# Patient Record
Sex: Male | Born: 1935 | Race: White | Hispanic: No | Marital: Married | Smoking: Former smoker
Health system: Southern US, Community
[De-identification: ages and names within clinical notes are randomized; demographics above are authoritative.]

## PROBLEM LIST (undated history)

## (undated) DIAGNOSIS — C4491 Basal cell carcinoma of skin, unspecified: Secondary | ICD-10-CM

## (undated) DIAGNOSIS — E785 Hyperlipidemia, unspecified: Secondary | ICD-10-CM

## (undated) DIAGNOSIS — D649 Anemia, unspecified: Secondary | ICD-10-CM

## (undated) DIAGNOSIS — M1990 Unspecified osteoarthritis, unspecified site: Secondary | ICD-10-CM

## (undated) DIAGNOSIS — G2581 Restless legs syndrome: Secondary | ICD-10-CM

## (undated) DIAGNOSIS — J449 Chronic obstructive pulmonary disease, unspecified: Secondary | ICD-10-CM

## (undated) DIAGNOSIS — C439 Malignant melanoma of skin, unspecified: Secondary | ICD-10-CM

## (undated) DIAGNOSIS — N200 Calculus of kidney: Secondary | ICD-10-CM

## (undated) DIAGNOSIS — J45909 Unspecified asthma, uncomplicated: Secondary | ICD-10-CM

## (undated) DIAGNOSIS — K635 Polyp of colon: Secondary | ICD-10-CM

## (undated) DIAGNOSIS — I499 Cardiac arrhythmia, unspecified: Secondary | ICD-10-CM

## (undated) DIAGNOSIS — R51 Headache: Secondary | ICD-10-CM

## (undated) HISTORY — DX: Calculus of kidney: N20.0

## (undated) HISTORY — PX: INGUINAL HERNIA REPAIR: SUR1180

## (undated) HISTORY — PX: NOSE SURGERY: SHX723

## (undated) HISTORY — DX: Polyp of colon: K63.5

## (undated) HISTORY — PX: COLONOSCOPY W/ POLYPECTOMY: SHX1380

## (undated) HISTORY — DX: Hyperlipidemia, unspecified: E78.5

## (undated) HISTORY — DX: Basal cell carcinoma of skin, unspecified: C44.91

## (undated) HISTORY — DX: Anemia, unspecified: D64.9

## (undated) HISTORY — DX: Unspecified asthma, uncomplicated: J45.909

## (undated) HISTORY — DX: Chronic obstructive pulmonary disease, unspecified: J44.9

## (undated) HISTORY — DX: Malignant melanoma of skin, unspecified: C43.9

## (undated) HISTORY — PX: FINGER SURGERY: SHX640

---

## 1999-02-10 ENCOUNTER — Encounter: Payer: Self-pay | Admitting: Emergency Medicine

## 1999-02-10 ENCOUNTER — Inpatient Hospital Stay (HOSPITAL_COMMUNITY): Admission: EM | Admit: 1999-02-10 | Discharge: 1999-02-12 | Payer: Self-pay | Admitting: Emergency Medicine

## 1999-02-11 ENCOUNTER — Encounter: Payer: Self-pay | Admitting: General Surgery

## 1999-02-23 ENCOUNTER — Encounter: Payer: Self-pay | Admitting: General Surgery

## 1999-02-23 ENCOUNTER — Ambulatory Visit (HOSPITAL_COMMUNITY): Admission: RE | Admit: 1999-02-23 | Discharge: 1999-02-23 | Payer: Self-pay | Admitting: General Surgery

## 2002-09-08 ENCOUNTER — Encounter: Payer: Self-pay | Admitting: Gastroenterology

## 2002-09-08 ENCOUNTER — Ambulatory Visit (HOSPITAL_COMMUNITY): Admission: RE | Admit: 2002-09-08 | Discharge: 2002-09-08 | Payer: Self-pay | Admitting: Gastroenterology

## 2003-12-28 ENCOUNTER — Ambulatory Visit: Payer: Self-pay | Admitting: Internal Medicine

## 2004-09-08 ENCOUNTER — Ambulatory Visit: Payer: Self-pay | Admitting: Internal Medicine

## 2004-09-11 ENCOUNTER — Ambulatory Visit: Payer: Self-pay | Admitting: Internal Medicine

## 2004-09-14 ENCOUNTER — Ambulatory Visit: Payer: Self-pay | Admitting: Internal Medicine

## 2004-10-30 ENCOUNTER — Ambulatory Visit: Payer: Self-pay | Admitting: Gastroenterology

## 2004-10-31 ENCOUNTER — Ambulatory Visit: Payer: Self-pay | Admitting: Gastroenterology

## 2004-10-31 ENCOUNTER — Encounter (INDEPENDENT_AMBULATORY_CARE_PROVIDER_SITE_OTHER): Payer: Self-pay | Admitting: Specialist

## 2004-11-15 ENCOUNTER — Ambulatory Visit: Payer: Self-pay | Admitting: Gastroenterology

## 2004-11-29 ENCOUNTER — Ambulatory Visit: Payer: Self-pay | Admitting: Gastroenterology

## 2004-12-08 ENCOUNTER — Ambulatory Visit: Payer: Self-pay | Admitting: Internal Medicine

## 2005-02-02 ENCOUNTER — Emergency Department (HOSPITAL_COMMUNITY): Admission: EM | Admit: 2005-02-02 | Discharge: 2005-02-02 | Payer: Self-pay | Admitting: Emergency Medicine

## 2005-02-05 ENCOUNTER — Ambulatory Visit: Payer: Self-pay | Admitting: Internal Medicine

## 2005-02-16 ENCOUNTER — Ambulatory Visit: Payer: Self-pay

## 2005-05-07 ENCOUNTER — Ambulatory Visit: Payer: Self-pay | Admitting: Internal Medicine

## 2005-07-05 ENCOUNTER — Ambulatory Visit: Payer: Self-pay | Admitting: Internal Medicine

## 2005-09-19 ENCOUNTER — Ambulatory Visit: Payer: Self-pay | Admitting: Internal Medicine

## 2005-10-05 ENCOUNTER — Ambulatory Visit: Payer: Self-pay | Admitting: Internal Medicine

## 2005-10-29 ENCOUNTER — Ambulatory Visit: Payer: Self-pay | Admitting: Internal Medicine

## 2005-12-05 ENCOUNTER — Ambulatory Visit: Payer: Self-pay | Admitting: Internal Medicine

## 2006-02-21 ENCOUNTER — Ambulatory Visit: Payer: Self-pay | Admitting: Internal Medicine

## 2006-02-25 ENCOUNTER — Ambulatory Visit: Payer: Self-pay | Admitting: Critical Care Medicine

## 2006-02-25 ENCOUNTER — Ambulatory Visit: Payer: Self-pay | Admitting: Internal Medicine

## 2006-02-25 ENCOUNTER — Ambulatory Visit: Payer: Self-pay | Admitting: Cardiovascular Disease

## 2006-02-25 ENCOUNTER — Inpatient Hospital Stay (HOSPITAL_COMMUNITY): Admission: EM | Admit: 2006-02-25 | Discharge: 2006-03-04 | Payer: Self-pay | Admitting: Emergency Medicine

## 2006-02-26 ENCOUNTER — Encounter: Payer: Self-pay | Admitting: Cardiology

## 2006-02-26 ENCOUNTER — Ambulatory Visit: Payer: Self-pay | Admitting: Internal Medicine

## 2006-03-14 ENCOUNTER — Ambulatory Visit: Payer: Self-pay | Admitting: Critical Care Medicine

## 2006-03-15 DIAGNOSIS — J45909 Unspecified asthma, uncomplicated: Secondary | ICD-10-CM | POA: Insufficient documentation

## 2006-03-15 DIAGNOSIS — Z860100 Personal history of colon polyps, unspecified: Secondary | ICD-10-CM | POA: Insufficient documentation

## 2006-03-15 DIAGNOSIS — J4489 Other specified chronic obstructive pulmonary disease: Secondary | ICD-10-CM | POA: Insufficient documentation

## 2006-03-15 DIAGNOSIS — M810 Age-related osteoporosis without current pathological fracture: Secondary | ICD-10-CM | POA: Insufficient documentation

## 2006-03-15 DIAGNOSIS — D649 Anemia, unspecified: Secondary | ICD-10-CM | POA: Insufficient documentation

## 2006-03-15 DIAGNOSIS — M109 Gout, unspecified: Secondary | ICD-10-CM | POA: Insufficient documentation

## 2006-03-15 DIAGNOSIS — J449 Chronic obstructive pulmonary disease, unspecified: Secondary | ICD-10-CM | POA: Insufficient documentation

## 2006-05-02 ENCOUNTER — Ambulatory Visit: Payer: Self-pay | Admitting: Internal Medicine

## 2006-05-02 LAB — CONVERTED CEMR LAB
Cholesterol: 190 mg/dL (ref 0–200)
HDL: 41.8 mg/dL (ref >39.0)
Hgb A1c MFr Bld: 5.7 % (ref 4.6–6.0)
LDL Cholesterol: 135 mg/dL — ABNORMAL HIGH (ref 0–99)
Total CHOL/HDL Ratio: 4.5
Triglycerides: 64 mg/dL (ref 0–149)
VLDL: 13 mg/dL (ref 0–40)

## 2006-05-20 ENCOUNTER — Ambulatory Visit: Payer: Self-pay | Admitting: Internal Medicine

## 2006-07-24 ENCOUNTER — Ambulatory Visit: Payer: Self-pay | Admitting: Internal Medicine

## 2006-08-09 ENCOUNTER — Ambulatory Visit: Payer: Self-pay | Admitting: Internal Medicine

## 2006-08-12 ENCOUNTER — Encounter: Payer: Self-pay | Admitting: Internal Medicine

## 2006-08-12 LAB — CONVERTED CEMR LAB
ALT: 20 units/L (ref 0–40)
AST: 31 units/L (ref 0–37)
Cholesterol: 137 mg/dL (ref 0–200)
HDL: 50.9 mg/dL (ref >39.0)
Hgb A1c MFr Bld: 6 % (ref 4.6–6.0)
LDL Cholesterol: 78 mg/dL (ref 0–99)
Total CHOL/HDL Ratio: 2.7
Triglycerides: 40 mg/dL (ref 0–149)
VLDL: 8 mg/dL (ref 0–40)

## 2006-08-13 ENCOUNTER — Encounter (INDEPENDENT_AMBULATORY_CARE_PROVIDER_SITE_OTHER): Payer: Self-pay | Admitting: *Deleted

## 2006-12-03 ENCOUNTER — Ambulatory Visit: Payer: Self-pay | Admitting: Internal Medicine

## 2006-12-13 ENCOUNTER — Telehealth (INDEPENDENT_AMBULATORY_CARE_PROVIDER_SITE_OTHER): Payer: Self-pay | Admitting: *Deleted

## 2007-01-01 ENCOUNTER — Telehealth (INDEPENDENT_AMBULATORY_CARE_PROVIDER_SITE_OTHER): Payer: Self-pay | Admitting: *Deleted

## 2007-01-02 ENCOUNTER — Ambulatory Visit: Payer: Self-pay | Admitting: Internal Medicine

## 2007-01-13 ENCOUNTER — Telehealth (INDEPENDENT_AMBULATORY_CARE_PROVIDER_SITE_OTHER): Payer: Self-pay | Admitting: *Deleted

## 2007-03-19 ENCOUNTER — Ambulatory Visit: Payer: Self-pay | Admitting: Internal Medicine

## 2007-03-22 LAB — CONVERTED CEMR LAB: Hgb A1c MFr Bld: 6 % (ref 4.6–6.0)

## 2007-03-24 ENCOUNTER — Encounter (INDEPENDENT_AMBULATORY_CARE_PROVIDER_SITE_OTHER): Payer: Self-pay | Admitting: *Deleted

## 2007-05-12 ENCOUNTER — Telehealth (INDEPENDENT_AMBULATORY_CARE_PROVIDER_SITE_OTHER): Payer: Self-pay | Admitting: *Deleted

## 2007-05-13 ENCOUNTER — Telehealth (INDEPENDENT_AMBULATORY_CARE_PROVIDER_SITE_OTHER): Payer: Self-pay | Admitting: *Deleted

## 2007-05-14 ENCOUNTER — Telehealth (INDEPENDENT_AMBULATORY_CARE_PROVIDER_SITE_OTHER): Payer: Self-pay | Admitting: *Deleted

## 2007-06-10 ENCOUNTER — Telehealth (INDEPENDENT_AMBULATORY_CARE_PROVIDER_SITE_OTHER): Payer: Self-pay | Admitting: *Deleted

## 2007-06-16 ENCOUNTER — Telehealth (INDEPENDENT_AMBULATORY_CARE_PROVIDER_SITE_OTHER): Payer: Self-pay | Admitting: *Deleted

## 2007-06-18 ENCOUNTER — Telehealth (INDEPENDENT_AMBULATORY_CARE_PROVIDER_SITE_OTHER): Payer: Self-pay | Admitting: *Deleted

## 2007-08-18 ENCOUNTER — Telehealth (INDEPENDENT_AMBULATORY_CARE_PROVIDER_SITE_OTHER): Payer: Self-pay | Admitting: *Deleted

## 2007-08-20 ENCOUNTER — Ambulatory Visit: Payer: Self-pay | Admitting: Internal Medicine

## 2007-08-25 ENCOUNTER — Encounter (INDEPENDENT_AMBULATORY_CARE_PROVIDER_SITE_OTHER): Payer: Self-pay | Admitting: *Deleted

## 2007-08-25 LAB — CONVERTED CEMR LAB
ALT: 23 units/L (ref 0–53)
AST: 26 units/L (ref 0–37)
Albumin: 3.9 g/dL (ref 3.5–5.2)
Alkaline Phosphatase: 54 units/L (ref 39–117)
Bilirubin, Direct: 0.1 mg/dL (ref 0.0–0.3)
Cholesterol: 121 mg/dL (ref 0–200)
HDL: 45.2 mg/dL (ref >39.0)
LDL Cholesterol: 63 mg/dL (ref 0–99)
Total Bilirubin: 0.8 mg/dL (ref 0.3–1.2)
Total CHOL/HDL Ratio: 2.7
Total Protein: 7.3 g/dL (ref 6.0–8.3)
Triglycerides: 63 mg/dL (ref 0–149)
VLDL: 13 mg/dL (ref 0–40)
Vit D, 1,25-Dihydroxy: 49 (ref 30–89)

## 2007-08-28 ENCOUNTER — Telehealth (INDEPENDENT_AMBULATORY_CARE_PROVIDER_SITE_OTHER): Payer: Self-pay | Admitting: *Deleted

## 2007-09-11 ENCOUNTER — Ambulatory Visit: Payer: Self-pay | Admitting: Internal Medicine

## 2007-09-11 DIAGNOSIS — J309 Allergic rhinitis, unspecified: Secondary | ICD-10-CM | POA: Insufficient documentation

## 2007-09-11 DIAGNOSIS — G2581 Restless legs syndrome: Secondary | ICD-10-CM | POA: Insufficient documentation

## 2007-09-11 LAB — CONVERTED CEMR LAB
Cholesterol, target level: 200 mg/dL
HDL goal, serum: 40 mg/dL
LDL Goal: 160 mg/dL

## 2007-09-19 ENCOUNTER — Telehealth (INDEPENDENT_AMBULATORY_CARE_PROVIDER_SITE_OTHER): Payer: Self-pay | Admitting: *Deleted

## 2007-09-19 LAB — CONVERTED CEMR LAB
BUN: 21 mg/dL (ref 6–23)
Basophils Absolute: 0.1 10*3/uL (ref 0.0–0.1)
Basophils Relative: 0.8 % (ref 0.0–3.0)
Creatinine, Ser: 1.1 mg/dL (ref 0.4–1.5)
Eosinophils Absolute: 0.2 10*3/uL (ref 0.0–0.7)
Eosinophils Relative: 2.9 % (ref 0.0–5.0)
HCT: 34.2 % — ABNORMAL LOW (ref 39.0–52.0)
Hemoglobin: 12.1 g/dL — ABNORMAL LOW (ref 13.0–17.0)
Hgb A1c MFr Bld: 6 % (ref 4.6–6.0)
Iron: 95 ug/dL (ref 42–165)
Lymphocytes Relative: 26.7 % (ref 12.0–46.0)
MCHC: 35.5 g/dL (ref 30.0–36.0)
MCV: 86.1 fL (ref 78.0–100.0)
Monocytes Absolute: 1.1 10*3/uL — ABNORMAL HIGH (ref 0.1–1.0)
Monocytes Relative: 13.1 % — ABNORMAL HIGH (ref 3.0–12.0)
Neutro Abs: 4.6 10*3/uL (ref 1.4–7.7)
Neutrophils Relative %: 56.5 % (ref 43.0–77.0)
Platelets: 198 10*3/uL (ref 150–400)
Potassium: 4.3 meq/L (ref 3.5–5.1)
RBC: 3.97 M/uL — ABNORMAL LOW (ref 4.22–5.81)
RDW: 12.8 % (ref 11.5–14.6)
Saturation Ratios: 27.1 % (ref 20.0–50.0)
TSH: 2.05 microintl units/mL (ref 0.35–5.50)
Transferrin: 250.7 mg/dL (ref >=212.0)
WBC: 8.2 10*3/uL (ref 4.5–10.5)

## 2007-10-01 ENCOUNTER — Encounter (INDEPENDENT_AMBULATORY_CARE_PROVIDER_SITE_OTHER): Payer: Self-pay | Admitting: *Deleted

## 2007-10-01 ENCOUNTER — Ambulatory Visit: Payer: Self-pay | Admitting: Internal Medicine

## 2007-10-01 LAB — CONVERTED CEMR LAB
OCCULT 1: NEGATIVE
OCCULT 2: NEGATIVE
OCCULT 3: NEGATIVE

## 2007-10-05 LAB — CONVERTED CEMR LAB
Basophils Absolute: 0.1 10*3/uL (ref 0.0–0.1)
Basophils Relative: 0.9 % (ref 0.0–3.0)
Eosinophils Absolute: 0.1 10*3/uL (ref 0.0–0.7)
Eosinophils Relative: 2.2 % (ref 0.0–5.0)
Folate: >20 ng/mL
HCT: 34.9 % — ABNORMAL LOW (ref 39.0–52.0)
Hemoglobin: 12.2 g/dL — ABNORMAL LOW (ref 13.0–17.0)
Lymphocytes Relative: 24.3 % (ref 12.0–46.0)
MCHC: 34.8 g/dL (ref 30.0–36.0)
MCV: 87.3 fL (ref 78.0–100.0)
Monocytes Absolute: 0.9 10*3/uL (ref 0.1–1.0)
Monocytes Relative: 13.6 % — ABNORMAL HIGH (ref 3.0–12.0)
Neutro Abs: 4 10*3/uL (ref 1.4–7.7)
Neutrophils Relative %: 59 % (ref 43.0–77.0)
Platelets: 201 10*3/uL (ref 150–400)
RBC: 4 M/uL — ABNORMAL LOW (ref 4.22–5.81)
RDW: 12.1 % (ref 11.5–14.6)
Vitamin B-12: 361 pg/mL (ref 211–911)
WBC: 6.7 10*3/uL (ref 4.5–10.5)

## 2007-10-06 ENCOUNTER — Encounter (INDEPENDENT_AMBULATORY_CARE_PROVIDER_SITE_OTHER): Payer: Self-pay | Admitting: *Deleted

## 2007-11-12 ENCOUNTER — Ambulatory Visit: Payer: Self-pay | Admitting: Internal Medicine

## 2008-05-06 ENCOUNTER — Ambulatory Visit: Payer: Self-pay | Admitting: Internal Medicine

## 2008-05-06 DIAGNOSIS — J069 Acute upper respiratory infection, unspecified: Secondary | ICD-10-CM | POA: Insufficient documentation

## 2008-05-06 DIAGNOSIS — R7309 Other abnormal glucose: Secondary | ICD-10-CM | POA: Insufficient documentation

## 2008-05-06 DIAGNOSIS — K5289 Other specified noninfective gastroenteritis and colitis: Secondary | ICD-10-CM | POA: Insufficient documentation

## 2008-05-10 ENCOUNTER — Telehealth: Payer: Self-pay | Admitting: Internal Medicine

## 2008-05-10 ENCOUNTER — Encounter (INDEPENDENT_AMBULATORY_CARE_PROVIDER_SITE_OTHER): Payer: Self-pay | Admitting: *Deleted

## 2008-05-10 LAB — CONVERTED CEMR LAB
Basophils Absolute: 0 10*3/uL (ref 0.0–0.1)
Basophils Relative: 0.5 % (ref 0.0–3.0)
Eosinophils Absolute: 0.2 10*3/uL (ref 0.0–0.7)
Eosinophils Relative: 2.2 % (ref 0.0–5.0)
Folate: >20 ng/mL
HCT: 38 % — ABNORMAL LOW (ref 39.0–52.0)
Hemoglobin: 13 g/dL (ref 13.0–17.0)
Hgb A1c MFr Bld: 5.9 % (ref 4.6–6.5)
Iron: 102 ug/dL (ref 42–165)
Lymphocytes Relative: 28.8 % (ref 12.0–46.0)
Lymphs Abs: 2 10*3/uL (ref 0.7–4.0)
MCHC: 34.3 g/dL (ref 30.0–36.0)
MCV: 87.8 fL (ref 78.0–100.0)
Monocytes Absolute: 1 10*3/uL (ref 0.1–1.0)
Monocytes Relative: 14.5 % — ABNORMAL HIGH (ref 3.0–12.0)
Neutro Abs: 3.8 10*3/uL (ref 1.4–7.7)
Neutrophils Relative %: 54 % (ref 43.0–77.0)
Platelets: 180 10*3/uL (ref 150.0–400.0)
RBC: 4.32 M/uL (ref 4.22–5.81)
RDW: 12.6 % (ref 11.5–14.6)
Saturation Ratios: 24 % (ref 20.0–50.0)
Transferrin: 303.2 mg/dL (ref 212.0–360.0)
Vit D, 25-Hydroxy: 45 ng/mL (ref 30–89)
Vitamin B-12: 411 pg/mL (ref 211–911)
WBC: 7 10*3/uL (ref 4.5–10.5)

## 2008-05-13 ENCOUNTER — Telehealth: Payer: Self-pay | Admitting: Internal Medicine

## 2008-07-08 ENCOUNTER — Telehealth (INDEPENDENT_AMBULATORY_CARE_PROVIDER_SITE_OTHER): Payer: Self-pay | Admitting: *Deleted

## 2008-08-18 ENCOUNTER — Ambulatory Visit: Payer: Self-pay | Admitting: Internal Medicine

## 2008-08-18 ENCOUNTER — Encounter: Payer: Self-pay | Admitting: Internal Medicine

## 2008-09-08 ENCOUNTER — Emergency Department (HOSPITAL_COMMUNITY): Admission: EM | Admit: 2008-09-08 | Discharge: 2008-09-08 | Payer: Self-pay | Admitting: Emergency Medicine

## 2008-09-10 ENCOUNTER — Telehealth (INDEPENDENT_AMBULATORY_CARE_PROVIDER_SITE_OTHER): Payer: Self-pay | Admitting: *Deleted

## 2008-09-17 ENCOUNTER — Telehealth (INDEPENDENT_AMBULATORY_CARE_PROVIDER_SITE_OTHER): Payer: Self-pay | Admitting: *Deleted

## 2008-10-05 ENCOUNTER — Ambulatory Visit: Payer: Self-pay | Admitting: Internal Medicine

## 2008-10-05 DIAGNOSIS — R0681 Apnea, not elsewhere classified: Secondary | ICD-10-CM | POA: Insufficient documentation

## 2008-10-05 DIAGNOSIS — R5383 Other fatigue: Secondary | ICD-10-CM

## 2008-10-05 DIAGNOSIS — R5381 Other malaise: Secondary | ICD-10-CM | POA: Insufficient documentation

## 2008-10-05 DIAGNOSIS — Z85828 Personal history of other malignant neoplasm of skin: Secondary | ICD-10-CM | POA: Insufficient documentation

## 2008-10-10 LAB — CONVERTED CEMR LAB
ALT: 56 units/L — ABNORMAL HIGH (ref 0–53)
AST: 58 units/L — ABNORMAL HIGH (ref 0–37)
BUN: 19 mg/dL (ref 6–23)
Basophils Absolute: 0 10*3/uL (ref 0.0–0.1)
Basophils Relative: 0.5 % (ref 0.0–3.0)
Creatinine, Ser: 1.2 mg/dL (ref 0.4–1.5)
Eosinophils Absolute: 0.3 10*3/uL (ref 0.0–0.7)
Eosinophils Relative: 3.7 % (ref 0.0–5.0)
Free T4: 0.8 ng/dL (ref 0.6–1.6)
HCT: 34.9 % — ABNORMAL LOW (ref 39.0–52.0)
Hemoglobin: 12 g/dL — ABNORMAL LOW (ref 13.0–17.0)
Hgb A1c MFr Bld: 6 % (ref 4.6–6.5)
Iron: 83 ug/dL (ref 42–165)
Lymphocytes Relative: 23.6 % (ref 12.0–46.0)
Lymphs Abs: 1.7 10*3/uL (ref 0.7–4.0)
MCHC: 34.3 g/dL (ref 30.0–36.0)
MCV: 88.2 fL (ref 78.0–100.0)
Monocytes Absolute: 1 10*3/uL (ref 0.1–1.0)
Monocytes Relative: 14.2 % — ABNORMAL HIGH (ref 3.0–12.0)
Neutro Abs: 4.1 10*3/uL (ref 1.4–7.7)
Neutrophils Relative %: 58 % (ref 43.0–77.0)
Platelets: 175 10*3/uL (ref 150.0–400.0)
Potassium: 4.8 meq/L (ref 3.5–5.1)
RBC: 3.96 M/uL — ABNORMAL LOW (ref 4.22–5.81)
RDW: 12.5 % (ref 11.5–14.6)
Saturation Ratios: 23.9 % (ref 20.0–50.0)
TSH: 2.19 microintl units/mL (ref 0.35–5.50)
Transferrin: 248 mg/dL (ref 212.0–360.0)
WBC: 7.1 10*3/uL (ref 4.5–10.5)

## 2008-10-11 ENCOUNTER — Encounter (INDEPENDENT_AMBULATORY_CARE_PROVIDER_SITE_OTHER): Payer: Self-pay | Admitting: *Deleted

## 2008-10-14 ENCOUNTER — Ambulatory Visit: Payer: Self-pay | Admitting: Pulmonary Disease

## 2008-10-14 DIAGNOSIS — R0989 Other specified symptoms and signs involving the circulatory and respiratory systems: Secondary | ICD-10-CM

## 2008-10-14 DIAGNOSIS — R0609 Other forms of dyspnea: Secondary | ICD-10-CM | POA: Insufficient documentation

## 2008-10-19 ENCOUNTER — Encounter: Payer: Self-pay | Admitting: Internal Medicine

## 2008-11-10 ENCOUNTER — Telehealth (INDEPENDENT_AMBULATORY_CARE_PROVIDER_SITE_OTHER): Payer: Self-pay | Admitting: *Deleted

## 2008-11-11 ENCOUNTER — Ambulatory Visit: Payer: Self-pay | Admitting: Internal Medicine

## 2008-11-15 ENCOUNTER — Encounter (INDEPENDENT_AMBULATORY_CARE_PROVIDER_SITE_OTHER): Payer: Self-pay | Admitting: *Deleted

## 2008-11-15 LAB — CONVERTED CEMR LAB
ALT: 24 units/L (ref 0–53)
AST: 31 units/L (ref 0–37)
Albumin: 3.9 g/dL (ref 3.5–5.2)
Alkaline Phosphatase: 59 units/L (ref 39–117)
Basophils Absolute: 0.1 10*3/uL (ref 0.0–0.1)
Basophils Relative: 0.9 % (ref 0.0–3.0)
Bilirubin, Direct: 0 mg/dL (ref 0.0–0.3)
Eosinophils Absolute: 0.2 10*3/uL (ref 0.0–0.7)
Eosinophils Relative: 3.2 % (ref 0.0–5.0)
Folate: >20 ng/mL
HCT: 35.9 % — ABNORMAL LOW (ref 39.0–52.0)
Hemoglobin: 12.1 g/dL — ABNORMAL LOW (ref 13.0–17.0)
Lymphocytes Relative: 27.1 % (ref 12.0–46.0)
Lymphs Abs: 1.6 10*3/uL (ref 0.7–4.0)
MCHC: 33.7 g/dL (ref 30.0–36.0)
MCV: 89 fL (ref 78.0–100.0)
Monocytes Absolute: 0.8 10*3/uL (ref 0.1–1.0)
Monocytes Relative: 13.9 % — ABNORMAL HIGH (ref 3.0–12.0)
Neutro Abs: 3.3 10*3/uL (ref 1.4–7.7)
Neutrophils Relative %: 54.9 % (ref 43.0–77.0)
Platelets: 196 10*3/uL (ref 150.0–400.0)
RBC: 4.04 M/uL — ABNORMAL LOW (ref 4.22–5.81)
RDW: 12.6 % (ref 11.5–14.6)
Total Bilirubin: 1 mg/dL (ref 0.3–1.2)
Total Protein: 7.6 g/dL (ref 6.0–8.3)
Vitamin B-12: 347 pg/mL (ref 211–911)
WBC: 6 10*3/uL (ref 4.5–10.5)

## 2008-11-19 ENCOUNTER — Telehealth (INDEPENDENT_AMBULATORY_CARE_PROVIDER_SITE_OTHER): Payer: Self-pay | Admitting: *Deleted

## 2008-11-25 ENCOUNTER — Ambulatory Visit: Payer: Self-pay | Admitting: Internal Medicine

## 2008-11-29 ENCOUNTER — Ambulatory Visit: Payer: Self-pay | Admitting: Internal Medicine

## 2008-11-29 DIAGNOSIS — G479 Sleep disorder, unspecified: Secondary | ICD-10-CM | POA: Insufficient documentation

## 2009-02-07 ENCOUNTER — Telehealth (INDEPENDENT_AMBULATORY_CARE_PROVIDER_SITE_OTHER): Payer: Self-pay | Admitting: *Deleted

## 2009-03-22 ENCOUNTER — Telehealth (INDEPENDENT_AMBULATORY_CARE_PROVIDER_SITE_OTHER): Payer: Self-pay | Admitting: *Deleted

## 2009-05-09 ENCOUNTER — Telehealth (INDEPENDENT_AMBULATORY_CARE_PROVIDER_SITE_OTHER): Payer: Self-pay | Admitting: *Deleted

## 2009-06-13 ENCOUNTER — Telehealth (INDEPENDENT_AMBULATORY_CARE_PROVIDER_SITE_OTHER): Payer: Self-pay | Admitting: *Deleted

## 2009-07-11 ENCOUNTER — Telehealth (INDEPENDENT_AMBULATORY_CARE_PROVIDER_SITE_OTHER): Payer: Self-pay | Admitting: *Deleted

## 2009-09-12 ENCOUNTER — Telehealth (INDEPENDENT_AMBULATORY_CARE_PROVIDER_SITE_OTHER): Payer: Self-pay | Admitting: *Deleted

## 2009-10-07 ENCOUNTER — Ambulatory Visit: Payer: Self-pay | Admitting: Internal Medicine

## 2009-10-13 ENCOUNTER — Telehealth (INDEPENDENT_AMBULATORY_CARE_PROVIDER_SITE_OTHER): Payer: Self-pay | Admitting: *Deleted

## 2009-10-18 ENCOUNTER — Telehealth (INDEPENDENT_AMBULATORY_CARE_PROVIDER_SITE_OTHER): Payer: Self-pay | Admitting: *Deleted

## 2009-10-25 ENCOUNTER — Ambulatory Visit: Payer: Self-pay | Admitting: Internal Medicine

## 2009-10-25 DIAGNOSIS — Z87442 Personal history of urinary calculi: Secondary | ICD-10-CM | POA: Insufficient documentation

## 2009-10-25 DIAGNOSIS — I10 Essential (primary) hypertension: Secondary | ICD-10-CM | POA: Insufficient documentation

## 2009-10-25 DIAGNOSIS — E785 Hyperlipidemia, unspecified: Secondary | ICD-10-CM | POA: Insufficient documentation

## 2009-10-26 ENCOUNTER — Telehealth (INDEPENDENT_AMBULATORY_CARE_PROVIDER_SITE_OTHER): Payer: Self-pay | Admitting: *Deleted

## 2009-10-26 ENCOUNTER — Encounter (INDEPENDENT_AMBULATORY_CARE_PROVIDER_SITE_OTHER): Payer: Self-pay | Admitting: *Deleted

## 2009-10-26 ENCOUNTER — Encounter: Payer: Self-pay | Admitting: Internal Medicine

## 2009-10-26 LAB — CONVERTED CEMR LAB
ALT: 41 units/L (ref 0–53)
AST: 47 units/L — ABNORMAL HIGH (ref 0–37)
Albumin: 4.1 g/dL (ref 3.5–5.2)
Alkaline Phosphatase: 51 units/L (ref 39–117)
BUN: 21 mg/dL (ref 6–23)
Basophils Absolute: 0 10*3/uL (ref 0.0–0.1)
Basophils Relative: 0.5 % (ref 0.0–3.0)
Bilirubin, Direct: 0.1 mg/dL (ref 0.0–0.3)
CO2: 29 meq/L (ref 19–32)
Calcium: 9.2 mg/dL (ref 8.4–10.5)
Chloride: 106 meq/L (ref 96–112)
Cholesterol: 138 mg/dL (ref 0–200)
Creatinine, Ser: 1.1 mg/dL (ref 0.4–1.5)
Eosinophils Absolute: 0.1 10*3/uL (ref 0.0–0.7)
Eosinophils Relative: 1.8 % (ref 0.0–5.0)
GFR calc non Af Amer: 70.39 mL/min (ref >60)
Glucose, Bld: 87 mg/dL (ref 70–99)
HCT: 36.2 % — ABNORMAL LOW (ref 39.0–52.0)
HDL: 52 mg/dL (ref >39.00)
Hemoglobin: 12.7 g/dL — ABNORMAL LOW (ref 13.0–17.0)
LDL Cholesterol: 75 mg/dL (ref 0–99)
Lymphocytes Relative: 26.4 % (ref 12.0–46.0)
Lymphs Abs: 1.7 10*3/uL (ref 0.7–4.0)
MCHC: 35 g/dL (ref 30.0–36.0)
MCV: 87.8 fL (ref 78.0–100.0)
Monocytes Absolute: 0.9 10*3/uL (ref 0.1–1.0)
Monocytes Relative: 13.6 % — ABNORMAL HIGH (ref 3.0–12.0)
Neutro Abs: 3.6 10*3/uL (ref 1.4–7.7)
Neutrophils Relative %: 57.7 % (ref 43.0–77.0)
Platelets: 188 10*3/uL (ref 150.0–400.0)
Potassium: 4.5 meq/L (ref 3.5–5.1)
RBC: 4.12 M/uL — ABNORMAL LOW (ref 4.22–5.81)
RDW: 13.6 % (ref 11.5–14.6)
Sodium: 143 meq/L (ref 135–145)
TSH: 2.21 microintl units/mL (ref 0.35–5.50)
Total Bilirubin: 0.7 mg/dL (ref 0.3–1.2)
Total CHOL/HDL Ratio: 3
Total Protein: 7.1 g/dL (ref 6.0–8.3)
Triglycerides: 53 mg/dL (ref 0.0–149.0)
VLDL: 10.6 mg/dL (ref 0.0–40.0)
Vit D, 25-Hydroxy: 50 ng/mL (ref 30–89)
WBC: 6.3 10*3/uL (ref 4.5–10.5)

## 2009-10-28 ENCOUNTER — Encounter: Payer: Self-pay | Admitting: Internal Medicine

## 2009-10-31 ENCOUNTER — Encounter: Payer: Self-pay | Admitting: Internal Medicine

## 2009-11-04 ENCOUNTER — Ambulatory Visit: Payer: Self-pay | Admitting: Cardiology

## 2009-11-07 ENCOUNTER — Telehealth (INDEPENDENT_AMBULATORY_CARE_PROVIDER_SITE_OTHER): Payer: Self-pay | Admitting: *Deleted

## 2009-11-14 ENCOUNTER — Telehealth (INDEPENDENT_AMBULATORY_CARE_PROVIDER_SITE_OTHER): Payer: Self-pay | Admitting: *Deleted

## 2009-11-23 ENCOUNTER — Encounter: Payer: Self-pay | Admitting: Internal Medicine

## 2009-12-06 ENCOUNTER — Encounter: Payer: Self-pay | Admitting: Internal Medicine

## 2009-12-09 ENCOUNTER — Ambulatory Visit: Payer: Self-pay | Admitting: Internal Medicine

## 2009-12-12 ENCOUNTER — Encounter: Admission: RE | Admit: 2009-12-12 | Discharge: 2009-12-12 | Payer: Self-pay | Admitting: Specialist

## 2009-12-13 ENCOUNTER — Encounter: Payer: Self-pay | Admitting: Internal Medicine

## 2010-02-09 ENCOUNTER — Telehealth (INDEPENDENT_AMBULATORY_CARE_PROVIDER_SITE_OTHER): Payer: Self-pay | Admitting: *Deleted

## 2010-02-19 DIAGNOSIS — C439 Malignant melanoma of skin, unspecified: Secondary | ICD-10-CM

## 2010-02-19 HISTORY — DX: Malignant melanoma of skin, unspecified: C43.9

## 2010-03-21 NOTE — Letter (Signed)
Summary: [facility]  [facility]   Imported By: Lanelle Bal 10/28/2008 09:34:15  _____________________________________________________________________  External Attachment:    Type:   Image     Comment:   External Document

## 2010-03-21 NOTE — Assessment & Plan Note (Signed)
Summary: sinus--acute only--tl   Vital Signs:  Patient Profile:   77 Years Old Male Weight:      154 pounds Temp:     98.6 degrees F oral Pulse rate:   72 / minute BP sitting:   140 / 78  (left arm) Cuff size:   large  Vitals Entered By: Shonna Chock (September 11, 2007 1:39 PM)                 Chief Complaint:  SINUS PRESSURE/HEADACHE OFF/ON Robert Richard.  History of Present Illness: Labs, Lipid & HTN  Management  reviewed . Values @ goal  on  1/2 of 40 mg of pravastatin. Sinus symptoms intermittently; no better with spray.  Hypertension History:      He complains of headache and visual symptoms, but denies chest pain, palpitations, dyspnea with exertion, orthopnea, PND, peripheral edema, neurologic problems, syncope, and side effects from treatment.  He notes no problems with any antihypertensive medication side effects.  Further comments include: BP 135-145/ 80.        Positive major cardiovascular risk factors include male age 57 years old or older, hyperlipidemia, and hypertension.  Negative major cardiovascular risk factors include no history of diabetes, negative family history for ischemic heart disease, and non-tobacco-user status.        Further assessment for target organ damage reveals no history of ASHD, stroke/TIA, or peripheral vascular disease.    Lipid Management History:      Positive NCEP/ATP III risk factors include male age 66 years old or older and hypertension.  Negative NCEP/ATP III risk factors include non-diabetic, no family history for ischemic heart disease, non-tobacco-user status, no ASHD (atherosclerotic heart disease), no prior stroke/TIA, no peripheral vascular disease, and no history of aortic aneurysm.        Current Allergies (reviewed today): ! * IVP DYE    Risk Factors:  Tobacco use:  quit  Family History Risk Factors:    Family History of MI in females < 41 years old:  no    Family History of MI in males < 36 years old:  no    Review of Systems  General      Denies chills, fever, sleep disorder, and sweats.  Eyes      Complains of double vision.      Denies blurring and vision loss-both eyes.      Glasses correct this  ENT      R frontal ha w/o purulence; no facial pain  CV      Denies bluish discoloration of lips or nails and leg cramps with exertion.  Resp      Denies excessive snoring, hypersomnolence, and morning headaches.      Some am sputum with ambulation in am  MS      Complains of muscle aches and cramps.      ? RLS; Aleve or Legitin help  Neuro      Denies disturbances in coordination, numbness, poor balance, and tingling.  Allergy      Complains of itching eyes, seasonal allergies, and sneezing.      Claritin doesn't help sinuses   Physical Exam  General:     in no acute distress; alert,appropriate and cooperative throughout examination Head:     Normocephalic and atraumatic without obvious abnormalities. No apparent alopecia or balding. Eyes:     No corneal or conjunctival inflammation noted. EOMI. Perrla. Vision grossly normal. Ears:     External ear exam shows no significant  lesions or deformities.  Otoscopic examination reveals clear canals, tympanic membranes are intact bilaterally without bulging, retraction, inflammation or discharge. Hearing is grossly normal bilaterally. Nose:     External nasal examination shows no deformity or inflammation. Nasal mucosa are pink and moist without lesions or exudates.Septum to R Mouth:     Oral mucosa and oropharynx without lesions or exudates.  Teeth in good repair. Breasts:     Barrel chest Lungs:     Normal respiratory effort, chest expands symmetrically. Lungs are clear to auscultation, no crackles or wheezes. Heart:     normal rate, regular rhythm, no gallop, no rub, no JVD, and grade 1 /6 systolic murmur.   Pulses:     R and L carotid,radial, and posterior tibial pulses are full and equal bilaterally. Decreased DPP Skin:      Intact without suspicious lesions or rashes Cervical Nodes:     No lymphadenopathy noted Axillary Nodes:     No palpable lymphadenopathy Psych:     Oriented X3 and severely anxious.      Impression & Recommendations:  Problem # 1:  RHINITIS (ICD-477.9)  His updated medication list for this problem includes:    Loratadine 10 Mg Tabs (Loratadine) .Marland Kitchen... 1 once daily prn    Astepro 137 Mcg/spray Soln (Azelastine hcl) .Marland Kitchen... 2 sprays R  nostril two times a day  His updated medication list for this problem includes:    Loratadine 10 Mg Tabs (Loratadine) .Marland Kitchen... 1 once daily prn    Astepro 137 Mcg/spray Soln (Azelastine hcl) .Marland Kitchen... 2 sprays r nostril two times a day   Problem # 2:  OTHER AND UNSPECIFIED HYPERLIPIDEMIA (ICD-272.4)  His updated medication list for this problem includes:    Pravachol 40 Mg Tabs (Pravastatin sodium) .Marland Kitchen... 1/2 qhs   Problem # 3:  UNSPECIFIED ESSENTIAL HYPERTENSION (ICD-401.9)  His updated medication list for this problem includes:    Amlodipine Besylate 10 Mg Tabs (Amlodipine besylate) .Marland Kitchen... 1/2 tab qhs  Orders: TLB-Creatinine, Blood (82565-CREA) TLB-BUN (Urea Nitrogen) (84520-BUN) TLB-Potassium (K+) (84132-K)   Problem # 4:  RESTLESS LEG SYNDROME (ICD-333.94)  Orders: TLB-CBC Platelet - w/Differential (85025-CBCD) TLB-IBC Pnl (Iron/FE;Transferrin) (83550-IBC) TLB-TSH (Thyroid Stimulating Hormone) (84443-TSH) TLB-BUN (Urea Nitrogen) (84520-BUN) TLB-Potassium (K+) (84132-K) TLB-A1C / Hgb A1C (Glycohemoglobin) (83036-A1C)   Complete Medication List: 1)  Pravachol 40 Mg Tabs (Pravastatin sodium) .... 1/2 qhs 2)  Amlodipine Besylate 10 Mg Tabs (Amlodipine besylate) .... 1/2 tab qhs 3)  Buspirone Hcl 15 Mg Tabs (Buspirone hcl) .... 1/2 by mouth bid 4)  Fish Oil 3 Daily  5)  Multivitamin  6)  Caltrate 600+d 600-400 Mg-unit Tabs (Calcium carbonate-vitamin d) .Marland Kitchen.. 1 bid 7)  Fiber 3 Daily  8)  Vit D 1/2 Qd  9)  Claritin 10mg  Prn  10)  Benadryl  Prn  11)  Fosamax 70 Mg Tabs (Alendronate sodium) .Marland Kitchen.. 1 by mouth q week 12)  Albuterol Sulfate (2.5 Mg/3ml) 0.083% Nebu (Albuterol sulfate) .... Use as needed for sob 13)  Loratadine 10 Mg Tabs (Loratadine) .Marland Kitchen.. 1 once daily prn 14)  Fish Oil 1000 Mg Caps (Omega-3 fatty acids) .Marland Kitchen.. 1 by mouth three times a day 15)  Astepro 137 Mcg/spray Soln (Azelastine hcl) .... 2 sprays r nostril two times a day  Hypertension Assessment/Plan:      The patient's hypertensive risk group is category B: At least one risk factor (excluding diabetes) with no target organ damage.  His calculated 10 year risk of coronary heart disease is 14 %.  Today's blood pressure is 140/78.    Lipid Assessment/Plan:      Based on NCEP/ATP III, the patient's risk factor category is "2 or more risk factors and a calculated 10 year CAD risk of < 20%".  From this information, the patient's calculated lipid goals are as follows: Total cholesterol goal is 200; LDL cholesterol goal is 130; HDL cholesterol goal is 40; Triglyceride goal is 150.  His LDL cholesterol goal has been met.     Patient Instructions: 1)  Stop present nasal decongestant. 2)  Check your Blood Pressure regularly. If it is above: 130/85 ON AVERAGE  you should take a full  pill(10 mg ) of Amlodipine rather than 1/2. Vitamin D 1000 International Units once daily & Cal/Mag at bedtime.   Prescriptions: LORATADINE 10 MG  TABS (LORATADINE) 1 once daily prn  #90 x 3   Entered and Authorized by:   Marga Melnick MD   Signed by:   Marga Melnick MD on 09/11/2007   Method used:   Print then Give to Patient   RxID:   [id number] BUSPIRONE HCL 15 MG  TABS (BUSPIRONE HCL) 1/2 by mouth bid  #90 x 3   Entered and Authorized by:   Marga Melnick MD   Signed by:   Marga Melnick MD on 09/11/2007   Method used:   Print then Give to Patient   RxID:   [id number] AMLODIPINE BESYLATE 10 MG  TABS (AMLODIPINE BESYLATE) 1/2 tab qhs  #45 x 3   Entered and Authorized by:    Marga Melnick MD   Signed by:   Marga Melnick MD on 09/11/2007   Method used:   Print then Give to Patient   RxID:   [id number] ASTEPRO 137 MCG/SPRAY  SOLN (AZELASTINE HCL) 2 sprays R nostril two times a day  #1 x 11   Entered and Authorized by:   Marga Melnick MD   Signed by:   Marga Melnick MD on 09/11/2007   Method used:   Print then Give to Patient   RxID:   [id number] PRAVACHOL 40 MG  TABS (PRAVASTATIN SODIUM) 1/2 qhs  #90 x 1   Entered and Authorized by:   Marga Melnick MD   Signed by:   Marga Melnick MD on 09/11/2007   Method used:   Print then Give to Patient   RxID:   [id number]  ]

## 2010-03-21 NOTE — Progress Notes (Signed)
Summary: hop--Refill  Phone Note Refill Request   Refills Requested: Medication #1:  PROTONIX 40 MG  TBEC 1 q am Rx received via fax from [facility]--[phone]  Initial call taken by: Freddy Jaksch,  May 14, 2007 9:40 AM      Prescriptions: PROTONIX 40 MG  TBEC (PANTOPRAZOLE SODIUM) 1 q am  #30 x 3   Entered by:   Shonna Chock   Authorized by:   Marga Melnick MD   Signed by:   Shonna Chock on 05/14/2007   Method used:   Electronically sent to ...       [facility]*       [address]       [place], [place]  [address]       Ph: [phone] or [phone]       Fax: [phone]   RxID:   [id number]

## 2010-03-21 NOTE — Assessment & Plan Note (Signed)
Summary: FLU SHOT/KDC  Nurse Visit  Flu Vaccine Consent Questions     Do you have a history of severe allergic reactions to this vaccine? no    Any prior history of allergic reactions to egg and/or gelatin? no    Do you have a sensitivity to the preservative Thimersol? no    Do you have a past history of Guillan-Barre Syndrome? no    Do you currently have an acute febrile illness? no    Have you ever had a severe reaction to latex? no    Vaccine information given and explained to patient? yes    Are you currently pregnant? no    Lot Number:[id number]   Exp Date:08/18/2009   Site Given  Right Deltoid IM    Allergies: 1)  ! * Ivp Dye  Orders Added: 1)  Flu Vaccine 67yrs + [25366] 2)  Administration Flu vaccine - MCR [G0008]

## 2010-03-21 NOTE — Progress Notes (Signed)
Summary: HOP---RX  Phone Note Refill Request   Refills Requested: Medication #1:  ASTEPRO 137 MCG/SPRAY  SOLN 2 sprays R nostril two times a day. ASTEPRO .137 MCG THAT STRENGTH HAS BEEN DISCONTINUED CAN WE GIVE HER THE NEW ASTEPRO 0.15% (205.5 MCG) THANKS [facility]--[phone] FAX--[phone]  Initial call taken by: Freddy Jaksch,  May 10, 2008 2:44 PM  Follow-up for Phone Call        DR Benelli Winther PLS ADVISE............Marland KitchenFelecia Deloach CMA  May 12, 2008 12:56 PM  Additional Follow-up for Phone Call Additional follow up Details #1::        yes , 1 inhalation two times a day as needed, RX 1 year Additional Follow-up by: Marga Melnick,  May 12, 2008 1:34 PM    New/Updated Medications: ASTEPRO 0.15 % SOLN (AZELASTINE HCL) 1 inhalation two times a day as needed   Prescriptions: ASTEPRO 0.15 % SOLN (AZELASTINE HCL) 1 inhalation two times a day as needed  #1 x 6   Entered by:   Jeremy Johann CMA   Authorized by:   Marga Melnick   Signed by:   Jeremy Johann CMA on 05/12/2008   Method used:   Faxed to ...       [facility]* (retail)       [address]       [place], [place]  [address]       Ph: [phone] or [phone]       Fax: [phone]   RxID:   [id number]

## 2010-03-21 NOTE — Assessment & Plan Note (Signed)
Summary: FLU SHOT/KB  Nurse Visit  CC: Flu shot./kb   Allergies: 1)  ! * Ivp Dye  Orders Added: 1)  Flu Vaccine 74yrs + MEDICARE PATIENTS [Q2039] 2)  Administration Flu vaccine - MCR [G0008]           Flu Vaccine Consent Questions     Do you have a history of severe allergic reactions to this vaccine? no    Any prior history of allergic reactions to egg and/or gelatin? no    Do you have a sensitivity to the preservative Thimersol? no    Do you have a past history of Guillan-Barre Syndrome? no    Do you currently have an acute febrile illness? no    Have you ever had a severe reaction to latex? no    Vaccine information given and explained to patient? yes    Are you currently pregnant? no    Lot Number:[id number]   Exp Date:08/19/2010   Site Given  Left Deltoid IMu

## 2010-03-21 NOTE — Progress Notes (Signed)
----   Converted from flag ---- ---- 11/05/2009 6:59 AM, Marga Melnick MD wrote: Tramadol 50 mg every 6 hrs as needed , #30 can be callede in if headache persists ------------------------------  **Patient's wife aware rx called in and aware of CT Results./[facility] CMA  November 07, 2009 1:49 PM      New/Updated Medications: TRAMADOL HCL 50 MG TABS (TRAMADOL HCL) every 6 hrs as needed Prescriptions: TRAMADOL HCL 50 MG TABS (TRAMADOL HCL) every 6 hrs as needed  #30 x 0   Entered by:   Shonna Chock CMA   Authorized by:   Marga Melnick MD   Signed by:   Shonna Chock CMA on 11/07/2009   Method used:   Electronically to        [facility]* (retail)       [address]       [place], [place]  [address]       Ph: [phone] or [phone]       Fax: [phone]   RxID:   [id number]

## 2010-03-21 NOTE — Progress Notes (Signed)
Summary: Refill Request  Phone Note Refill Request Call back at [phone] Message from:  Pharmacy on June 13, 2009 10:55 AM  Refills Requested: Medication #1:  AMLODIPINE BESYLATE 10 MG  TABS 1/2 tab qhs   Dosage confirmed as above?Dosage Confirmed   Supply Requested: 3 months   Last Refilled: 05/07/2009 [facility]  Next Appointment Scheduled: none Initial call taken by: Harold Barban,  June 13, 2009 10:55 AM    Prescriptions: AMLODIPINE BESYLATE 10 MG  TABS (AMLODIPINE BESYLATE) 1/2 tab qhs  #45 x 2   Entered by:   Shonna Chock   Authorized by:   Marga Melnick MD   Signed by:   Shonna Chock on 06/13/2009   Method used:   Electronically to        [facility]* (retail)       [address]       [place], [place]  [address]       Ph: [phone] or [phone]       Fax: [phone]   RxID:   [id number]

## 2010-03-21 NOTE — Assessment & Plan Note (Signed)
Summary: sinus inf,/alr   Vital Signs:  Patient Profile:   77 Years Old Male Weight:      153.25 pounds Temp:     97.0 degrees F oral Pulse rate:   64 / minute Pulse rhythm:   regular Resp:     16 per minute BP sitting:   128 / 70  (left arm) Cuff size:   regular  Pt. in pain?   no  Vitals Entered By: Wendall Stade (January 02, 2007 5:05 PM)                  Chief Complaint:  sinus infection and URI symptoms.  History of Present Illness: sinus problems ongoing, headache for two weeks getting worse , pain and pressure, raw feeling, nausea  URI Symptoms Benadryl, Claritin      This is a 77 year old man who presents with URI symptoms.  The patient reports nasal congestion, purulent nasal discharge, and dry cough, but denies clear nasal discharge, sore throat, productive cough, earache, and sick contacts.  The patient denies fever, low-grade fever (<100.5 degrees), fever of 100.5-103 degrees, fever of 103.1-104 degrees, fever to >104 degrees, stiff neck, dyspnea, wheezing, rash, vomiting, diarrhea, use of an antipyretic, and response to antipyretic.  The patient also reports itchy watery eyes, itchy throat, sneezing, seasonal symptoms, response to antihistamine, headache, and severe fatigue.  The patient denies muscle aches.  Risk factors for Strep sinusitis include double sickening.  The patient denies the following risk factors for Strep sinusitis: unilateral facial pain, unilateral nasal discharge, poor response to decongestant, tooth pain, Strep exposure, tender adenopathy, and absence of cough.    Current Allergies (reviewed today): ! * IVP DYE      Physical Exam  General:     Well-developed,well-nourished,in no acute distress; alert,appropriate and cooperative throughout examination; underweight appearing.   Eyes:     No corneal or conjunctival inflammation noted. EOMI. Perrla. Vision grossly normal. Ears:     TMs dull; wax on L Nose:     External nasal examination  shows no deformity or inflammation. Nasal mucosa are dry Mouth:     Pharyngeal erythema Lungs:     Normal respiratory effort, chest expands symmetrically. Lungs are clear to auscultation, no crackles or wheezes. decreased BS Cervical Nodes:     No lymphadenopathy noted Axillary Nodes:     No palpable lymphadenopathy    Impression & Recommendations:  Problem # 1:  SINUSITIS- ACUTE-NOS (ICD-461.9)  His updated medication list for this problem includes:    Augmentin 875-125 Mg Tabs (Amoxicillin-pot clavulanate) .Marland Kitchen... 1 q 12 hrs with a meal   Complete Medication List: 1)  Protonix 40 Mg Tbec (Pantoprazole sodium) .Marland Kitchen.. 1 q am 2)  Pravachol 40 Mg Tabs (Pravastatin sodium) .Marland Kitchen.. 1 by mouth qd 3)  Amlodipine Besylate 10 Mg Tabs (Amlodipine besylate) .... 1/2 tab qhs 4)  Buspirone Hcl 15 Mg Tabs (Buspirone hcl) .... 1/2 by mouth bid 5)  Fish Oil 3 Daily  6)  Multivitamin  7)  Caltrate 600+d 600-400 Mg-unit Tabs (Calcium carbonate-vitamin d) .Marland Kitchen.. 1 bid 8)  Fiber 3 Daily  9)  Vit D 1/2 Qd  10)  Claritin 10mg  Prn  11)  Benadryl Prn  12)  Fosamax 70 Mg Tabs (Alendronate sodium) .Marland Kitchen.. 1 by mouth q week 13)  Albuterol Sulfate (2.5 Mg/29ml) 0.083% Nebu (Albuterol sulfate) .... Use as needed for sob 14)  Augmentin 875-125 Mg Tabs (Amoxicillin-pot clavulanate) .Marland Kitchen.. 1 q 12 hrs with a meal  Patient Instructions: 1)  Drink as much fluid as you can tolerate for the next few days. Neti pot once daily as needed nasal congestion. Plain Mucinex as needed for  thick secretions.    Prescriptions: AUGMENTIN 875-125 MG  TABS (AMOXICILLIN-POT CLAVULANATE) 1 q 12 hrs with a meal  #20 x 0   Entered and Authorized by:   Marga Melnick MD   Signed by:   Marga Melnick MD on 01/02/2007   Method used:   Print then Give to Patient   RxID:   [id number]  ]

## 2010-03-21 NOTE — Assessment & Plan Note (Signed)
Summary: acute/stomach pain some diarrhea, some restless leg/alr   Vital Signs:  Patient profile:   77 year old male Height:      72 inches Weight:      154.8 pounds BMI:     21.07 Temp:     97.6 degrees F oral Pulse rate:   72 / minute Resp:     18 per minute BP sitting:   126 / 72  (left arm) Cuff size:   large  Vitals Entered By: Shonna Chock (May 06, 2008 10:58 AM) Comments 1.) DIARRHEA/UPSET STOMACH X 2-3 DAYS  2.) RLS X LONG TIME-VERY BAD  3.) REFILL ALBUTEROL FOR NEBULIZER  4.)CONCERNS ABOUT SINUSES ON RIGHT SIDE   History of Present Illness: See 3 complaints & pertinent ROS. Major issue = RLS ;" 2 cousins lost their legs". He is very concerned about such happening to him. RLS almost every night as per wife.  Allergies: 1)  ! * Ivp Dye  Review of Systems General:  Complains of sweats; denies chills and fever; Occa night sweats. ENT:  Complains of sinus pressure; denies nasal congestion; R frontal pressure. no facial pain or purulence. Chronic tinnitus on L. CV:  Denies leg cramps with exertion. GI:  Complains of change in bowel habits; denies abdominal pain, bloody stools, dark tarry stools, and diarrhea; Loose not watery stool. MS:  Complains of muscle aches; Aching from knees down.  Physical Exam  General:  Thin,in no acute distress; alert,appropriate and cooperative throughout examination Eyes:  No corneal or conjunctival inflammation noted.  Perrla. No icterus Ears:  External ear exam shows no significant lesions or deformities.  Otoscopic examination reveals  dull TMs, some wax on L Nose:  External nasal examination shows no deformity or inflammation. Nasal mucosa are pink and moist without lesions or exudates. Septum to R Mouth:  Oral mucosa and oropharynx without lesions or exudates.  Teeth in good repair. No pharyngeal erythema.   Lungs:  Normal respiratory effort, chest expands symmetrically. Lungs are clear to auscultation, no crackles or wheezes. Decreased  BS Heart:  S4 with Grade 1 systolic murmur Abdomen:  Bowel sounds positive,abdomen soft and non-tender without masses, organomegaly or hernias noted. Scaphoid Pulses:  R and L carotid,radial,femoral  and posterior tibial pulses are full and equal bilaterally. No bruits, decreased DPP w/o ischemia Extremities:  No clubbing, cyanosis, edema Skin:  Intact without suspicious lesions or rashes Cervical Nodes:  No lymphadenopathy noted Axillary Nodes:  No palpable lymphadenopathy Psych:  Oriented X3 and slightly anxious.     Impression & Recommendations:  Problem # 1:  RESTLESS LEG SYNDROME (ICD-333.94)  Orders: Venipuncture ([address]) TLB-CBC Platelet - w/Differential (85025-CBCD) TLB-B12 + Folate Pnl (60454_09811-B14/NWG) TLB-IBC Pnl (Iron/FE;Transferrin) (83550-IBC) T-Vitamin D (25-Hydroxy) (95621-30865)  Problem # 2:  GASTROENTERITIS (ICD-558.9)  Orders: Venipuncture (78469)  Problem # 3:  URI (ICD-465.9)  No clinical sinusitis His updated medication list for this problem includes:    Loratadine 10 Mg Tabs (Loratadine) .Marland Kitchen... 1 once daily prn  Orders: Venipuncture (62952)  Problem # 4:  FASTING HYPERGLYCEMIA (ICD-790.29)  Orders: Venipuncture (84132) TLB-A1C / Hgb A1C (Glycohemoglobin) (83036-A1C)  Complete Medication List: 1)  Pravachol 40 Mg Tabs (Pravastatin sodium) .... 1/2 qhs 2)  Amlodipine Besylate 10 Mg Tabs (Amlodipine besylate) .... 1/2 tab qhs 3)  Buspirone Hcl 15 Mg Tabs (Buspirone hcl) .... 1/2 by mouth bid 4)  Fish Oil 3 Daily  5)  Multivitamin  6)  Caltrate 600+d 600-400 Mg-unit Tabs (Calcium carbonate-vitamin d) .Marland KitchenMarland KitchenMarland Kitchen 1  bid 7)  Fiber 3 Daily  8)  Vit D 1/2 Qd  9)  Benadryl Prn  10)  Fosamax 70 Mg Tabs (Alendronate sodium) .Marland Kitchen.. 1 by mouth q week 11)  Albuterol Sulfate (2.5 Mg/17ml) 0.083% Nebu (Albuterol sulfate) .... Use as needed for sob 12)  Loratadine 10 Mg Tabs (Loratadine) .Marland Kitchen.. 1 once daily prn 13)  Fish Oil 1000 Mg Caps (Omega-3 fatty acids) .Marland Kitchen.. 1  by mouth three times a day 14)  Astepro 137 Mcg/spray Soln (Azelastine hcl) .... 2 sprays r nostril two times a day  Patient Instructions: 1)  Continue Neti pot once daily as needed. Align once daily until bowels normal. Mag Cal  OR Cal Mag (calcium/ magnesium combo) at bedtime  Prescriptions: ALBUTEROL SULFATE (2.5 MG/3ML) 0.083%  NEBU (ALBUTEROL SULFATE) use as needed for sob  #30 x 11   Entered and Authorized by:   Marga Melnick   Signed by:   Marga Melnick on 05/06/2008   Method used:   Print then Give to Patient   RxID:   [id number]

## 2010-03-21 NOTE — Progress Notes (Signed)
Summary: Hop--Refill  Phone Note Refill Request   Refills Requested: Medication #1:  AMLODIPINE BESYLATE 10 MG  TABS 1/2 tab qhs   Last Refilled: 05/12/2007 Rx received via fax from [facility]--ph-[phone]  Initial call taken by: Freddy Jaksch,  June 18, 2007 9:27 AM      Prescriptions: AMLODIPINE BESYLATE 10 MG  TABS (AMLODIPINE BESYLATE) 1/2 tab qhs  #15 x 3   Entered by:   Shonna Chock   Authorized by:   Marga Melnick MD   Signed by:   Shonna Chock on 06/18/2007   Method used:   Electronically sent to ...       [facility]*       [address]       [place], [place]  [address]       Ph: [phone] or [phone]       Fax: [phone]   RxID:   [id number]

## 2010-03-21 NOTE — Progress Notes (Signed)
Summary: fluticasone refill   Phone Note Refill Request Message from:  Pharm[facility] fax [phone]  Refills Requested: Medication #1:  FLUTICASONE PROPIONATE 50 MCG/ACT SUSP 1 spray each nostril two times a day as needed. Initial call taken by: Barb Merino,  September 17, 2008 2:31 PM    Prescriptions: FLUTICASONE PROPIONATE 50 MCG/ACT SUSP (FLUTICASONE PROPIONATE) 1 spray each nostril two times a day as needed  #1 x 2   Entered by:   Doristine Devoid   Authorized by:   Marga Melnick MD   Signed by:   Doristine Devoid on 09/17/2008   Method used:   Electronically to        [facility]* (retail)       [address]       [place], [place]  [address]       Ph: [phone] or [phone]       Fax: [phone]   RxID:   [id number]

## 2010-03-21 NOTE — Letter (Signed)
Summary: Results Follow up Letter  [name] [place]  [address] [place], [place] [address]   Phone: [phone]  Fax: [phone]    08/25/2007 MRN: [id number]  Robert Richard [address] [place], [place]  [address]  Dear Mr. Sumida,  The following are the results of your recent test(s):  Test         Result    Pap Smear:        Normal _____  Not Normal _____ Comments: ______________________________________________________ Cholesterol: LDL(Bad cholesterol):         Your goal is less than:         HDL (Good cholesterol):       Your goal is more than: Comments:  ______________________________________________________ Mammogram:        Normal _____  Not Normal _____ Comments:  ___________________________________________________________________ Hemoccult:        Normal _____  Not normal _______ Comments:    _____________________________________________________________________ Other Tests: PLEASE SEE ATTACHED LABS DONE ON 08/20/2007 AND APPEND'S (DR.'S COMMENTS)    We routinely do not discuss normal results over the telephone.  If you desire a copy of the results, or you have any questions about this information we can discuss them at your next office visit.   Sincerely,

## 2010-03-21 NOTE — Progress Notes (Signed)
Summary: lab results  Phone Note Outgoing Call   Details for Reason: LAB RESULTS: Significant anemia present but iron levels normal.Please complete & return stool cards & repeat CBC with B12/folate level in next  2 weeks (285.9) Hopp   Signed by Marga Melnick MD on 09/19/2007 at 7:17 AM  Summary of Call: COPY MAILED TO PT patient number busy...........Marland KitchenShary Decamp  September 19, 2007 10:34 AM discussed with patient - lab visit scheduled -- patient did not have any stool cards but will come by to pick them up ...Marland KitchenMarland KitchenShary Decamp  September 19, 2007 12:39 PM

## 2010-03-21 NOTE — Progress Notes (Signed)
Summary: Refill Request/Appointment Due  Phone Note Refill Request Call back at [phone] Message from:  Pharmacy on September 12, 2009 10:21 AM  Refills Requested: Medication #1:  PRAVACHOL 40 MG  TABS 1/2 qhs   Dosage confirmed as above?Dosage Confirmed   Supply Requested: 1 month   Last Refilled: 08/10/2009 [facility]  Next Appointment Scheduled: none Initial call taken by: Lavell Islam,  September 12, 2009 10:31 AM  Follow-up for Phone Call        Last chlosterol check 2009, patient needs to schedule a yearly follow-up (labs to be done at the same time) Follow-up by: Shonna Chock CMA,  September 12, 2009 10:52 AM  Additional Follow-up for Phone Call Additional follow up Details #1::        called and left message to call back.Lavell Islam  September 12, 2009 2:28 PM    Additional Follow-up for Phone Call Additional follow up Details #2::    pt called back and has an appt scheduled for 10/25/09 for cpx and labs.Lavell Islam  September 12, 2009 3:37 PM  Prescriptions: PRAVACHOL 40 MG  TABS (PRAVASTATIN SODIUM) 1/2 qhs  #15 x 0   Entered by:   Shonna Chock CMA   Authorized by:   Marga Melnick MD   Signed by:   Shonna Chock CMA on 09/12/2009   Method used:   Electronically to        [facility] (retail)       [address]       [place], [place]  [address]       Ph: [phone] or [phone]       Fax: [phone]   RxID:   [id number]

## 2010-03-21 NOTE — Progress Notes (Signed)
Summary: -Request for ABX  Phone Note Call from Patient Call back at Home Phone [phone]   Caller: Patient Summary of Call: Message left on Voicemail: patient needs something for his sinuses, patient just finished his second round of abx yesterday. Patient would like this sent to [facility] pharmacy [phone]  Dr.Hopper please advise   Shonna Chock CMA  October 26, 2009 2:01 PM   Follow-up for Phone Call        White blood count was normal & sinus films did not show Sinusitis; I recommend a CT of sinuses as this is better test. Repeated  courses of antibiotics increase risk of fungal infections, resistant bacterial infections such as MRSA (Staph)  or colitis. Follow-up by: Marga Melnick MD,  October 26, 2009 4:14 PM  Additional Follow-up for Phone Call Additional follow up Details #1::        left message to call office............Marland KitchenFelecia Deloach CMA  October 26, 2009 4:46 PM  left message to call office..................Marland KitchenFelecia Deloach CMA  October 27, 2009 1:18 PM   DISCUSS WITH PATIENT wife............Marland KitchenFelecia Deloach CMA  October 27, 2009 2:27 PM

## 2010-03-21 NOTE — Letter (Signed)
Summary: Results Follow up Letter  [name] [place]  [address] [place], [place] [address]   Phone: [phone]  Fax: [phone]    08/13/2006 MRN: [id number]   [facility] [address] [place], [place]  [address]  Dear Robert Richard,  The following are the results of your recent test(s):  Test         Result    Pap Smear:        Normal _____  Not Normal _____ Comments: ______________________________________________________ Cholesterol: LDL(Bad cholesterol):         Your goal is less than:         HDL (Good cholesterol):       Your goal is more than: Comments:  ______________________________________________________ Mammogram:        Normal _____  Not Normal _____ Comments:  ___________________________________________________________________ Hemoccult:        Normal _____  Not normal _______ Comments:    _____________________________________________________________________ Other Tests:  Please see attached results and comments   We routinely do not discuss normal results over the telephone.  If you desire a copy of the results, or you have any questions about this information we can discuss them at your next office visit.   Sincerely,

## 2010-03-21 NOTE — Assessment & Plan Note (Signed)
Summary: cpx/fasting//kn   Vital Signs:  Patient profile:   77 year old male Height:      71.75 inches Weight:      150.2 pounds BMI:     20.59 Temp:     98.1 degrees F oral Pulse rate:   64 / minute Resp:     16 per minute BP sitting:   128 / 80  (left arm) Cuff size:   regular  Vitals Entered By: Shonna Chock CMA (October 25, 2009 10:32 AM) CC: 1.) CPX/Fasting labs, refill sleep meds   2.) Sinus Infection x 1 month, Lipid Management Comments Patient states he had TDap Vaccine 4-5 years ago at Urgent Care   Primary Care Provider:  Marga Melnick MD  CC:  1.) CPX/Fasting labs, refill sleep meds   2.) Sinus Infection x 1 month, and Lipid Management.  History of Present Illness: Here for Medicare AWV: 1.Risk factors based on Past M, S, F history:COPD;HTN;Dyslipidemia; Osteoporosis 2.Physical Activities: 60 leg lifts 6 X/week & other calisthentics + yard work 3.Depression/mood: no issue 4.Hearing: decreased to whisper @ 6 ft on L 5.ADL's: none 6.Fall Risk: none 7.Home Safety: no issues 8.Height, weight, &visual acuity:wall chart read @ 6 ft with lenses 9.Counseling: none requested ; Living Will & POA indicated 10.Labs ordered based on risk factors: see Orders 11. Referral Coordination: asked to consider Audiology evaluation 12. Care Plan: see Instructions 13. Cognitive Assessment: Oriented X 3; mood & affect stable; "WORLD " spelled backwards as "DROW"; memory & recall : 2 of 3 recalled. Hyperlipidemia Follow-Up      This is a 77 year old man who presents for Hyperlipidemia follow-up.  The patient denies muscle aches, GI upset, abdominal pain, flushing, itching, constipation, diarrhea, and fatigue.  The patient denies the following symptoms: chest pain/pressure, exercise intolerance, dypsnea, palpitations, syncope, and pedal edema.  Compliance with medications (by patient report) has been near 100%.  Dietary compliance has been good.  Adjunctive measures currently used by the  patient include fish oil supplements.  Hypertension Follow-Up      The patient also presents for Hypertension follow-up.  The patient reports urinary frequency due to increased fluid intake  and R frontal  headaches in context of recent URI, but denies lightheadedness.  Compliance with medications (by patient report) has been near 100%.  Adjunctive measures currently used by the patient include salt restriction.  BP 130-134/< 80.  Lipid Management History:      Positive NCEP/ATP III risk factors include male age 22 years old or older and hypertension.  Negative NCEP/ATP III risk factors include non-diabetic, no family history for ischemic heart disease, non-tobacco-user status, no ASHD (atherosclerotic heart disease), no prior stroke/TIA, no peripheral vascular disease, and no history of aortic aneurysm.     Preventive Screening-Counseling & Management  Alcohol-Tobacco     Alcohol drinks/day: 0     Smoking Status: smoked age [age], up to 3 ppd  Caffeine-Diet-Exercise     Caffeine use/day: 1 cup/day  Hep-HIV-STD-Contraception     Dental Visit-last 6 months yes     Sun Exposure-Excessive: no  Safety-Violence-Falls     Seat Belt Use: yes     Firearms in the Home: firearms in the home     Firearm Counseling: not indicated; uses recommended firearm safety measures     Smoke Detectors: yes      Blood Transfusions:  no.        Travel History:  none recently.    Current Medications (verified):  1)  Pravachol 40 Mg  Tabs (Pravastatin Sodium) .... 1/2 At Bedtime **appointment Due** 2)  Amlodipine Besylate 10 Mg  Tabs (Amlodipine Besylate) .... 1/2 Tab Qhs 3)  Buspirone Hcl 15 Mg  Tabs (Buspirone Hcl) .... 1/2 By Mouth Bid 4)  Multivitamin 5)  Caltrate 600+d 600-400 Mg-Unit  Tabs (Calcium Carbonate-Vitamin D) .Marland Kitchen.. 1 Bid 6)  Fiber 3 Daily 7)  Vit D 1/2 Qd 8)  Zyrtec Allergy 10 Mg  Tabs (Cetirizine Hcl) .... Take 1 Tablet By Mouth Once A Day 9)  Fosamax 70 Mg  Tabs (Alendronate Sodium) .Marland Kitchen..  1 By Mouth Q Week 10)  Albuterol Sulfate (2.5 Mg/78ml) 0.083%  Nebu (Albuterol Sulfate) .... Use As Needed For Sob 11)  Loratadine 10 Mg  Tabs (Loratadine) .Marland Kitchen.. 1 Once Daily Prn 12)  Fish Oil 1000 Mg  Caps (Omega-3 Fatty Acids) .Marland Kitchen.. 1 By Mouth Three Times A Day 13)  Fluticasone Propionate 50 Mcg/act Susp (Fluticasone Propionate) .Marland Kitchen.. 1 Spray Each Nostril Two Times A Day As Needed 14)  Zolpidem Tartrate 5 Mg Tabs (Zolpidem Tartrate) .Marland Kitchen.. 1 At Bedtime Insomnia 15)  Ranitidine Hcl 150 Mg Tabs (Ranitidine Hcl) .Marland Kitchen.. 1 By Mouth Every 12 Hours Pre Meals  Allergies: 1)  ! * Ivp Dye  Past History:  Past Medical History: Anemia,PMH of Colonic polyps, hx of COPD/ Astmatic bronchitis Gout, PMH of  Osteoporosis Allergic rhinitis Skin cancer, PMH of  Basal Cell , Dr Margo Aye Nephrolithiasis, hx of, Dr Retta Diones Hyperlipidemia: LDL goal = 130(NMR Lipoprofile)-160(Framingham Study)  Past Surgical History: Finger surgery for foreign body Colon polypectomy, Dr Arlyce Dice Inguinal herniorrhaphy Nasal surgery for septal deviation   Family History: allergies: mother, father, brothers, sisters  2 brothers Mother:CAD Father:CVA, MI @ 69,asthma  Social History: Patient states former smoker.  started at age 57.  2 to 3 ppd.  quit 1963. pt is married. pt has children. pt is retired from  [employer]. Smoking Status:  smoked age [age], up to 3 ppd Caffeine use/day:  1 cup/day Dental Care w/in 6 mos.:  yes Sun Exposure-Excessive:  no Seat Belt Use:  yes Blood Transfusions:  no  Review of Systems General:  Denies chills, fever, and sweats. Eyes:  Denies vision loss-both eyes; No purulence. ENT:  Neti pot two times a day with response. R facial pain & frontal headache.. Resp:  Denies chest pain with inspiration, cough, and coughing up blood; Intermittent yellow sputum cleared from throat. GU:  Denies discharge, dysuria, and hematuria. MS:  Complains of cramps. Allergy:  Complains of itching eyes and  sneezing.  Physical Exam  General:  Thin,in no acute distress; alert,appropriate and cooperative throughout examination Head:  Normocephalic and atraumatic without obvious abnormalities. No apparent alopecia ; moustache Eyes:  No corneal or conjunctival inflammation noted. EOMI. Perrla. Funduscopic exam benign, without hemorrhages, exudates or papilledema. Vision grossly normal. Ears:  External ear exam shows no significant lesions or deformities.  Otoscopic examination reveals clear canals, tympanic membranes are intact bilaterally without bulging, retraction, inflammation or discharge. Hearing is grossly normal bilaterally. Nose:  External nasal examination shows no deformity or inflammation. Nasal mucosa are pink and moist without lesions or exudates. Minimal septal deviation Mouth:  Oral mucosa and oropharynx without lesions or exudates.  Teeth in good repair. Neck:  No deformities, masses, or tenderness noted. Physiologic asymmetry of thyroid, R > L w/o nodules Chest Wall:  barrel-chest deformity.   Lungs:  Normal respiratory effort, chest expands symmetrically. Lungs are clear to auscultation, no crackles or wheezes.  Heart:  normal rate, regular rhythm, no gallop, no rub, no JVD, no HJR, and grade  1/6 systolic murmur @ apex.   Abdomen:  Bowel sounds positive,abdomen soft and non-tender without masses, organomegaly or hernias noted.Scaphoid abdomen Rectal:  No external abnormalities noted. Normal sphincter tone. No rectal masses or tenderness. Genitalia:  Testes bilaterally descended without nodularity, tenderness or masses. No scrotal masses or lesions. No penis lesions or urethral discharge. Prostate:  Prostate gland small and smooth, no  nodularity, tenderness, mass, asymmetry or induration. Msk:  Lordosis Pulses:  R and L carotid,radial,dorsalis pedis and posterior tibial pulses are full and equal bilaterally. Bruit R carotid Extremities:  No clubbing, cyanosis, edema, or deformity  noted with normal full range of motion of all joints.   Neurologic:  alert & oriented X3 and DTRs symmetrical and normal.   Skin:  Intact without suspicious lesions or rashes Cervical Nodes:  No lymphadenopathy noted Axillary Nodes:  No palpable lymphadenopathy Inguinal Nodes:  No significant adenopathy Psych:  memory intact for recent and remote, normally interactive, good eye contact, not anxious appearing, and not depressed appearing.     Impression & Recommendations:  Problem # 1:  PREVENTIVE HEALTH CARE (ICD-V70.0)  Orders: [facility] -Subsequent Annual Wellness Visit [phone])  Problem # 2:  HYPERLIPIDEMIA (ICD-272.4)  His updated medication list for this problem includes:    Pravachol 40 Mg Tabs (Pravastatin sodium) .Marland Kitchen... 1/2 at bedtime **appointment due**  Orders: Venipuncture (41324) TLB-Lipid Panel (80061-LIPID) TLB-Hepatic/Liver Function Pnl (80076-HEPATIC) TLB-TSH (Thyroid Stimulating Hormone) (84443-TSH) Specimen Handling (40102)  Problem # 3:  HYPERTENSION (ICD-401.9)  His updated medication list for this problem includes:    Amlodipine Besylate 10 Mg Tabs (Amlodipine besylate) .Marland Kitchen... 1/2 tab qhs  Orders: Venipuncture (72536) TLB-BMP (Basic Metabolic Panel-BMET) (80048-METABOL) EKG w/ Interpretation (93000)  Problem # 4:  URI (ICD-465.9)  R facial pain His updated medication list for this problem includes:    Zyrtec Allergy 10 Mg Tabs (Cetirizine hcl) .Marland Kitchen... Take 1 tablet by mouth once a day    Loratadine 10 Mg Tabs (Loratadine) .Marland Kitchen... 1 once daily prn  Orders: T-Sinuses Complete (70220TC) TLB-CBC Platelet - w/Differential (85025-CBCD) Specimen Handling (64403)  Problem # 5:  OSTEOPOROSIS (ICD-733.00)  His updated medication list for this problem includes:    Fosamax 70 Mg Tabs (Alendronate sodium) .Marland Kitchen... 1 by mouth q week  Orders: Venipuncture (47425) T-Vitamin D (25-Hydroxy) (95638-75643) Specimen Handling (32951) Radiology Referral (Radiology)  Problem  # 6:  COPD (ICD-496)  His updated medication list for this problem includes:    Albuterol Sulfate (2.5 Mg/17ml) 0.083% Nebu (Albuterol sulfate) ..... Use as needed for sob  Orders: Venipuncture (88416)  Complete Medication List: 1)  Pravachol 40 Mg Tabs (Pravastatin sodium) .... 1/2 at bedtime **appointment due** 2)  Amlodipine Besylate 10 Mg Tabs (Amlodipine besylate) .... 1/2 tab qhs 3)  Buspirone Hcl 15 Mg Tabs (Buspirone hcl) .... 1/2 by mouth bid 4)  Multivitamin  5)  Caltrate 600+d 600-400 Mg-unit Tabs (Calcium carbonate-vitamin d) .Marland Kitchen.. 1 bid 6)  Fiber 3 Daily  7)  Vit D 1/2 Qd  8)  Zyrtec Allergy 10 Mg Tabs (Cetirizine hcl) .... Take 1 tablet by mouth once a day 9)  Fosamax 70 Mg Tabs (Alendronate sodium) .Marland Kitchen.. 1 by mouth q week 10)  Albuterol Sulfate (2.5 Mg/23ml) 0.083% Nebu (Albuterol sulfate) .... Use as needed for sob 11)  Loratadine 10 Mg Tabs (Loratadine) .Marland Kitchen.. 1 once daily prn 12)  Fish Oil 1000 Mg Caps (Omega-3 fatty acids) .Marland KitchenMarland KitchenMarland Kitchen  1 by mouth three times a day 13)  Fluticasone Propionate 50 Mcg/act Susp (Fluticasone propionate) .Marland Kitchen.. 1 spray each nostril two times a day as needed 14)  Zolpidem Tartrate 5 Mg Tabs (Zolpidem tartrate) .Marland Kitchen.. 1 at bedtime insomnia 15)  Ranitidine Hcl 150 Mg Tabs (Ranitidine hcl) .Marland Kitchen.. 1 by mouth every 12 hours pre meals  Lipid Assessment/Plan:      Based on NCEP/ATP III, the patient's risk factor category is "2 or more risk factors and a calculated 10 year CAD risk of < 20%".  The patient's lipid goals are as follows: Total cholesterol goal is 200; LDL cholesterol goal is 160; HDL cholesterol goal is 40; Triglyceride goal is 150.  His LDL cholesterol goal has been met.    Patient Instructions: 1)  Verify whether POA & Living Will are in place. Consider Audiology evaluation as discussed. 2)  Check your Blood Pressure regularly. If it is above:135/85 ON AVERAGE  you should make an appointment. Prescriptions: FLUTICASONE PROPIONATE 50 MCG/ACT SUSP  (FLUTICASONE PROPIONATE) 1 spray each nostril two times a day as needed  #1 x 11   Entered and Authorized by:   Marga Melnick MD   Signed by:   Marga Melnick MD on 10/25/2009   Method used:   Print then Give to Patient   RxID:   [id number] ZOLPIDEM TARTRATE 5 MG TABS (ZOLPIDEM TARTRATE) 1 at bedtime insomnia  #10 x 1   Entered and Authorized by:   Marga Melnick MD   Signed by:   Marga Melnick MD on 10/25/2009   Method used:   Print then Give to Patient   RxID:   [id number]    Immunization History:  Pneumovax Immunization History:    Pneumovax:  historical (05/20/2006)   Appended Document: cpx/fasting//kn He denied any limitations to ADL ( feeding , dressing, bathroom habits)

## 2010-03-21 NOTE — Progress Notes (Signed)
Summary: REFILL  Phone Note Refill Request Message from:  Fax from Pharmacy on [address] [facility] FAX [phone]  Refills Requested: Medication #1:  FLUTICASONE PROPIONATE 50 MCG/ACT SUSP 1 spray each nostril two times a day as needed RANITDINE HCL 150 MG  Initial call taken by: Barb Merino,  February 07, 2009 12:49 PM    Prescriptions: LORATADINE 10 MG  TABS (LORATADINE) 1 once daily prn  #90 Each x 2   Entered by:   Shonna Chock   Authorized by:   Marga Melnick MD   Signed by:   Shonna Chock on 02/07/2009   Method used:   Electronically to        [facility] (retail)       [address]       [place], [place]  [address]       Ph: [phone] or [phone]       Fax: [phone]   RxID:   [id number] FLUTICASONE PROPIONATE 50 MCG/ACT SUSP (FLUTICASONE PROPIONATE) 1 spray each nostril two times a day as needed  #1 x 2   Entered by:   Shonna Chock   Authorized by:   Marga Melnick MD   Signed by:   Shonna Chock on 02/07/2009   Method used:   Electronically to        [facility] (retail)       [address]       [place], [place]  [address]       Ph: [phone] or [phone]       Fax: [phone]   RxID:   [id number]   Appended Document: REFILL    Prescriptions: RANITIDINE HCL 150 MG TABS (RANITIDINE HCL) 1 by mouth every 12 hours pre meals  #60 x 2   Entered by:   Shonna Chock   Authorized by:   Marga Melnick MD   Signed by:   Shonna Chock on 02/07/2009   Method used:   Electronically to        [facility] (retail)       [address]       [place], [place]  [address]       Ph: [phone] or [phone]       Fax: [phone]   RxID:   [id number]  Left message on machine for patient to return call when avaliable, Reason for call: discuss request for Ranitidine 150mg  (NOT ON LIST).Robert Richard  February 07, 2009 1:21 PM   Spoke with Mrs.Tangonan  and she said he is taking this medication, Dr.Hopper RX'ed .Shonna Chock  February 07, 2009 4:18 PM

## 2010-03-21 NOTE — Progress Notes (Signed)
Summary: Refill Requests  Phone Note Refill Request Call back at [phone] Message from:  Pharmacy on November 14, 2009 9:29 AM  Refills Requested: Medication #1:  RANITIDINE HCL 150 MG TABS 1 by mouth every 12 hours pre meals   Dosage confirmed as above?Dosage Confirmed   Supply Requested: 1 month   Last Refilled: 10/13/2009  Medication #2:  AMLODIPINE BESYLATE 10 MG  TABS 1/2 tab qhs   Dosage confirmed as above?Dosage Confirmed   Supply Requested: 3 months   Last Refilled: 07/11/2009  Medication #3:  PRAVACHOL 40 MG  TABS 1/2 at bedtime **APPOINTMENT DUE**   Dosage confirmed as above?Dosage Confirmed   Brand Name Necessary? No   Supply Requested: 1 month   Last Refilled: 10/13/2009 [facility]  Next Appointment Scheduled: none Initial call taken by: Harold Barban,  November 14, 2009 9:30 AM    New/Updated Medications: PRAVACHOL 40 MG  TABS (PRAVASTATIN SODIUM) 1/2 at bedtime Prescriptions: RANITIDINE HCL 150 MG TABS (RANITIDINE HCL) 1 by mouth every 12 hours pre meals  #60 x 11   Entered by:   Shonna Chock CMA   Authorized by:   Marga Melnick MD   Signed by:   Shonna Chock CMA on 11/14/2009   Method used:   Electronically to        [facility] (retail)       [address]       [place], [place]  [address]       Ph: [phone] or [phone]       Fax: [phone]   RxID:   [id number] AMLODIPINE BESYLATE 10 MG  TABS (AMLODIPINE BESYLATE) 1/2 tab qhs  #45 x 3   Entered by:   Shonna Chock CMA   Authorized by:   Marga Melnick MD   Signed by:   Shonna Chock CMA on 11/14/2009   Method used:   Electronically to        [facility] (retail)       [address]       [place], [place]  [address]       Ph: [phone] or [phone]       Fax: [phone]   RxID:   [id number] PRAVACHOL 40 MG  TABS (PRAVASTATIN SODIUM) 1/2 at bedtime  #45 x 3   Entered by:   Shonna Chock CMA  Authorized by:   Marga Melnick MD   Signed by:   Shonna Chock CMA on 11/14/2009   Method used:   Electronically to        [facility] (retail)       [address]       [place], [place]  [address]       Ph: [phone] or [phone]       Fax: [phone]   RxID:   [id number]

## 2010-03-21 NOTE — Assessment & Plan Note (Signed)
Summary: flu shot--ph  Nurse Visit    Prior Medications: PRAVACHOL 40 MG  TABS (PRAVASTATIN SODIUM) 1/2 qhs AMLODIPINE BESYLATE 10 MG  TABS (AMLODIPINE BESYLATE) 1/2 tab qhs BUSPIRONE HCL 15 MG  TABS (BUSPIRONE HCL) 1/2 by mouth bid FISH OIL 3 DAILY ()  MULTIVITAMIN ()  CALTRATE 600+D 600-400 MG-UNIT  TABS (CALCIUM CARBONATE-VITAMIN D) 1 bid FIBER 3 DAILY ()  VIT D 1/2 QD ()  CLARITIN 10MG  PRN ()  BENADRYL PRN ()  FOSAMAX 70 MG  TABS (ALENDRONATE SODIUM) 1 by mouth q week ALBUTEROL SULFATE (2.5 MG/3ML) 0.083%  NEBU (ALBUTEROL SULFATE) use as needed for sob LORATADINE 10 MG  TABS (LORATADINE) 1 once daily prn FISH OIL 1000 MG  CAPS (OMEGA-3 FATTY ACIDS) 1 by mouth three times a day ASTEPRO 137 MCG/SPRAY  SOLN (AZELASTINE HCL) 2 sprays R nostril two times a day Current Allergies: ! * IVP DYE    Orders Added: 1)  Admin 1st Vaccine [90471] 2)  Flu Vaccine 57yrs + [75643]   Flu Vaccine Consent Questions     Do you have a history of severe allergic reactions to this vaccine? no    Any prior history of allergic reactions to egg and/or gelatin? no    Do you have a sensitivity to the preservative Thimersol? no    Do you have a past history of Guillan-Barre Syndrome? no    Do you currently have an acute febrile illness? no    Have you ever had a severe reaction to latex? no    Vaccine information given and explained to patient? yes    Are you currently pregnant? no    Lot Number:[id number]   Site Given  Left Deltoid IM].opcflu

## 2010-03-21 NOTE — Progress Notes (Signed)
Summary: astepro  Phone Note From Pharmacy   Summary of Call: dr Jayvier Burgher please advise  pharmacy faxed over paperwork stating that insurance will not cover 31 day supply so can we change direction to 2 sprays each nostril twice daily.  orignal rx was for 1 spray bid prn disp #1 6 refills.............Marland KitchenFelecia Deloach CMA  May 13, 2008 2:37 PM  Follow-up for Phone Call        per dr Darreld Hoffer ok to change to 2 inhalation.Marland KitchenMarland KitchenMarland KitchenFelecia Deloach CMA  May 13, 2008 2:53 PM    New/Updated Medications: ASTEPRO 0.15 % SOLN (AZELASTINE HCL) 2  inhalation two times a day as needed   Prescriptions: ASTEPRO 0.15 % SOLN (AZELASTINE HCL) 2  inhalation two times a day as needed  #1 x 6   Entered by:   Jeremy Johann CMA   Authorized by:   Marga Melnick MD   Signed by:   Jeremy Johann CMA on 05/13/2008   Method used:   Re-Faxed to ...       [facility]* (retail)       [address]       [place], [place]  [address]       Ph: [phone] or [phone]       Fax: [phone]   RxID:   [id number]

## 2010-03-21 NOTE — Letter (Signed)
Summary: Results Follow up Letter  [place] [facility] [place]  [address] [place], [place] [address]   Phone: [phone]  Fax: [phone]    11/15/2008 MRN: [id number]  Robert Richard [address] [place], [place]  [address]  Dear Mr. Wakeman,  The following are the results of your recent test(s):  Test         Result    Pap Smear:        Normal _____  Not Normal _____ Comments: ______________________________________________________ Cholesterol: LDL(Bad cholesterol):         Your goal is less than:         HDL (Good cholesterol):       Your goal is more than: Comments:  ______________________________________________________ Mammogram:        Normal _____  Not Normal _____ Comments:  ___________________________________________________________________ Hemoccult:        Normal _____  Not normal _______ Comments:    _____________________________________________________________________ Other Tests: PLEASE SEE COPY OF LABS FROM 11/11/08 AND COMMENTS    We routinely do not discuss normal results over the telephone.  If you desire a copy of the results, or you have any questions about this information we can discuss them at your next office visit.   Sincerely,

## 2010-03-21 NOTE — Progress Notes (Signed)
Summary: STILL NO BETTER  Phone Note Call from Patient Call back at Home Phone [phone]   Caller: Spouse Summary of Call: PT SEEN ON 10-07-09 FOR SINUS INFECTION RX  Amoxicillin 500 Mg Caps. PT HAS FINISHED MED AND IS STILL NO BETTER PLS ADVISE................Marland KitchenFelecia Deloach CMA  October 18, 2009 1:09 PM   Follow-up for Phone Call        Metronidazole 250mg  three times a day #21; OVINB. Follow-up by: Marga Melnick MD,  October 18, 2009 1:24 PM  Additional Follow-up for Phone Call Additional follow up Details #1::        pt wife aware rx sent to pharmacy and OVINB.........Marland KitchenFelecia Deloach CMA  October 18, 2009 2:15 PM     New/Updated Medications: METRONIDAZOLE 250 MG TABS (METRONIDAZOLE) Take 1 tab three times a day Prescriptions: METRONIDAZOLE 250 MG TABS (METRONIDAZOLE) Take 1 tab three times a day  #21 x 0   Entered by:   Jeremy Johann CMA   Authorized by:   Loreen Freud DO   Signed by:   Jeremy Johann CMA on 10/18/2009   Method used:   Faxed to ...       [facility]* (retail)       [address]       [place], [place]  [address]       Ph: [phone] or [phone]       Fax: [phone]   RxID:   [id number]

## 2010-03-21 NOTE — Progress Notes (Signed)
Summary: REFILL BUSPIRONE//HOP  Phone Note Refill Request Message from:  Pharmacy on [facility] FAX [phone]  Refills Requested: Medication #1:  BUSPIRONE HCL 15 MG  TABS 1/2 by mouth bid  Medication #2:  AMLODIPINE BESYLATE 10 MG  TABS 1/2 tab qhs PRAVASTATIN SODIUM 40MG   Initial call taken by: Barb Merino,  September 10, 2008 10:21 AM  Follow-up for Phone Call        LAST REFILL 09/11/07, LAST OV 05/06/08 .Kandice Hams  September 10, 2008 3:35 PM  Follow-up by: Kandice Hams,  September 10, 2008 3:35 PM  Additional Follow-up for Phone Call Additional follow up Details #1::        OK  # 90 Additional Follow-up by: Marga Melnick MD,  September 10, 2008 4:45 PM    Prescriptions: BUSPIRONE HCL 15 MG  TABS (BUSPIRONE HCL) 1/2 by mouth bid  #90 x 0   Entered by:   Jeremy Johann CMA   Authorized by:   Marga Melnick MD   Signed by:   Jeremy Johann CMA on 09/13/2008   Method used:   Faxed to ...       [facility]* (retail)       [address]       [place], [place]  [address]       Ph: [phone] or [phone]       Fax: [phone]   RxID:   [id number] PRAVACHOL 40 MG  TABS (PRAVASTATIN SODIUM) 1/2 qhs  #45 x 2   Entered by:   Kandice Hams   Authorized by:   Marga Melnick MD   Signed by:   Kandice Hams on 09/10/2008   Method used:   Faxed to ...       [facility]* (retail)       [address]       [place], [place]  [address]       Ph: [phone] or [phone]       Fax: [phone]   RxID:   [id number] AMLODIPINE BESYLATE 10 MG  TABS (AMLODIPINE BESYLATE) 1/2 tab qhs  #45 x 2   Entered by:   Kandice Hams   Authorized by:   Marga Melnick MD   Signed by:   Kandice Hams on 09/10/2008   Method used:   Faxed to ...       [facility]* (retail)       [address]       [place], [place]  [address]       Ph: [phone] or [phone]       Fax: [phone]   RxID:    [id number]

## 2010-03-21 NOTE — Progress Notes (Signed)
Summary: refill  Phone Note Refill Request Message from:  Fax from Pharmacy on [facility] fax [phone]  alendronate sodium 70mg   Initial call taken by: Barb Merino,  March 22, 2009 4:03 PM    Prescriptions: FOSAMAX 70 MG  TABS (ALENDRONATE SODIUM) 1 by mouth q week  #4 x 11   Entered by:   Shonna Chock   Authorized by:   Marga Melnick MD   Signed by:   Shonna Chock on 03/22/2009   Method used:   Electronically to        [facility]* (retail)       [address]       [place], [place]  [address]       Ph: [phone] or [phone]       Fax: [phone]   RxID:   [id number]

## 2010-03-21 NOTE — Progress Notes (Signed)
Summary: refill  Phone Note Refill Request Message from:  Fax from Pharmacy on May 09, 2009 9:25 AM  Refills Requested: Medication #1:  BUSPIRONE HCL 15 MG  TABS 1/2 by mouth bid  Medication #2:  RANITIDINE HCL 150 MG TABS 1 by mouth every 12 hours pre meals. [facility] fax [phone]   Method Requested: Fax to Local Pharmacy Next Appointment Scheduled: no appt Initial call taken by: Barb Merino,  May 09, 2009 9:26 AM    Prescriptions: RANITIDINE HCL 150 MG TABS (RANITIDINE HCL) 1 by mouth every 12 hours pre meals  #60 x 5   Entered by:   Shonna Chock   Authorized by:   Marga Melnick MD   Signed by:   Shonna Chock on 05/09/2009   Method used:   Electronically to        [facility]* (retail)       [address]       [place], [place]  [address]       Ph: [phone] or [phone]       Fax: [phone]   RxID:   [id number] BUSPIRONE HCL 15 MG  TABS (BUSPIRONE HCL) 1/2 by mouth bid  #90 x 2   Entered by:   Shonna Chock   Authorized by:   Marga Melnick MD   Signed by:   Shonna Chock on 05/09/2009   Method used:   Electronically to        [facility]* (retail)       [address]       [place], [place]  [address]       Ph: [phone] or [phone]       Fax: [phone]   RxID:   [id number]

## 2010-03-21 NOTE — Letter (Signed)
Summary: Results Follow-up Letter  [place] [facility]  [address] [place], [place] [address]   Phone: [phone]  Fax: [phone]    10/06/2007        [facility] [address] [place], [place]  [address]  Dear Mr. Ehle,   The following are the results of your recent test(s):  Test     Result     Pap Smear    Normal_______  Not Normal_____       Comments: _________________________________________________________ Cholesterol LDL(Bad cholesterol):          Your goal is less than:         HDL (Good cholesterol):        Your goal is more than: _________________________________________________________ Other Tests:   _________________________________________________________  Please call for an appointment Or __Please see attached lab report._______________________________________________________ _________________________________________________________ _________________________________________________________  Sincerely,  Ardyth Man [place] [facility]

## 2010-03-21 NOTE — Letter (Signed)
[facility]: [facility]   Imported By: Lanelle Bal 01/03/2010 09:11:18  _____________________________________________________________________  External Attachment:    Type:   Image     Comment:   External Document

## 2010-03-21 NOTE — Progress Notes (Signed)
Summary: HOPPER-REFILL BUSPIRONE HCL 15MG   Phone Note Refill Request   Refills Requested: Medication #1:  BUSPIRONE HCL 15 MG  TABS 1/2 by mouth bid RECD BY FAX [facility] [phone] LAST FILLED #30 ON 10.24.08  Initial call taken by: Gwen Pounds,  January 13, 2007 10:21 AM  Follow-up for Phone Call        called to pharmacy Follow-up by: Wendall Stade,  January 13, 2007 4:50 PM      Prescriptions: BUSPIRONE HCL 15 MG  TABS (BUSPIRONE HCL) 1/2 by mouth bid  #30 x 3   Entered by:   Wendall Stade   Authorized by:   Marga Melnick MD   Signed by:   Wendall Stade on 01/13/2007   Method used:   Telephoned to ...       [facility]       [address]       [place], [place]  [address]       Ph: [phone]       Fax: [phone]   RxID:   [id number]     Appended Document: HOPPER-REFILL BUSPIRONE HCL 15MG  I faxed the refill to this pharmacy and I had called it on wednesday by phone.

## 2010-03-21 NOTE — Letter (Signed)
Summary: Primary Care Consult Scheduled Letter  [facility] [facility] [place]  [address] [place], [place] [address]   Phone: [phone]  Fax: [phone]      10/26/2009 MRN: [id number]  Robert Richard [address] [place], [place]  [address]    Dear [name],    We have scheduled an appointment for you.  At the recommendation of Dr. Marga Melnick, we have scheduled you for a Bone Density Scan with [facility] on 11-09-2009 at 11:30am.  Their address is [address] [place], [place] [address]. The office phone number is [phone].  If this appointment day and time is not convenient for you, please feel free to call the office of the doctor you are being referred to at the number listed above and reschedule the appointment.    It is important for you to keep your scheduled appointments. We are here to make sure you are given good patient care.   Thank you,    Renee, Patient Care Coordinator [place] at [facility]

## 2010-03-21 NOTE — Assessment & Plan Note (Signed)
Summary: sinus infection//kn   Vital Signs:  Patient profile:   77 year old male Height:      73 inches Weight:      153 pounds O2 Sat:      90 % on Room air Temp:     97.6 degrees F oral Pulse rate:   67 / minute Resp:     18 per minute BP sitting:   140 / 76  (left arm)  Vitals Entered By: Jeremy Johann CMA (October 07, 2009 1:30 PM)  O2 Flow:  Room air CC: sinus pressure, cough, headache, URI symptoms   Primary Care Provider:  Marga Melnick MD  CC:  sinus pressure, cough, headache, and URI symptoms.  History of Present Illness:  URI Symptoms      This is a 77 year old man who presents with URI symptoms X 3 weeks.  The patient reports nasal congestion, purulent nasal discharge, and dry cough, but denies earache.  The patient denies fever, dyspnea, and wheezing.  The patient also reports itchy watery eyes and R frontal  headache.  The patient denies sneezing.  Risk factors for Strep sinusitis include unilateral  (R) facial pain and tooth pain.  Rx: Zyrtec , Neti pot  Current Medications (verified): 1)  Pravachol 40 Mg  Tabs (Pravastatin Sodium) .... 1/2 Qhs 2)  Amlodipine Besylate 10 Mg  Tabs (Amlodipine Besylate) .... 1/2 Tab Qhs 3)  Buspirone Hcl 15 Mg  Tabs (Buspirone Hcl) .... 1/2 By Mouth Bid 4)  Multivitamin 5)  Caltrate 600+d 600-400 Mg-Unit  Tabs (Calcium Carbonate-Vitamin D) .Marland Kitchen.. 1 Bid 6)  Fiber 3 Daily 7)  Vit D 1/2 Qd 8)  Zyrtec Allergy 10 Mg  Tabs (Cetirizine Hcl) .... Take 1 Tablet By Mouth Once A Day 9)  Fosamax 70 Mg  Tabs (Alendronate Sodium) .Marland Kitchen.. 1 By Mouth Q Week 10)  Albuterol Sulfate (2.5 Mg/2ml) 0.083%  Nebu (Albuterol Sulfate) .... Use As Needed For Sob 11)  Loratadine 10 Mg  Tabs (Loratadine) .Marland Kitchen.. 1 Once Daily Prn 12)  Fish Oil 1000 Mg  Caps (Omega-3 Fatty Acids) .Marland Kitchen.. 1 By Mouth Three Times A Day 13)  Fluticasone Propionate 50 Mcg/act Susp (Fluticasone Propionate) .Marland Kitchen.. 1 Spray Each Nostril Two Times A Day As Needed 14)  Zolpidem Tartrate 5 Mg Tabs  (Zolpidem Tartrate) .Marland Kitchen.. 1 At Bedtime Insomnia 15)  Ranitidine Hcl 150 Mg Tabs (Ranitidine Hcl) .Marland Kitchen.. 1 By Mouth Every 12 Hours Pre Meals  Allergies: 1)  ! * Ivp Dye  Physical Exam  General:  Thin but in no acute distress; alert,appropriate and cooperative throughout examination Ears:  External ear exam shows no significant lesions or deformities.  Otoscopic examination reveals clear canals, tympanic membranes are intact bilaterally without bulging, retraction, inflammation or discharge. Hearing is grossly normal bilaterally. Nose:  External nasal examination shows no deformity or inflammation. Nasal mucosa are pink and moist without lesions or exudates. Septum  to R Mouth:  Oral mucosa and oropharynx without lesions or exudates.  Teeth in good repair. Lungs:  Normal respiratory effort, chest expands symmetrically. Lungs are clear to auscultation, no crackles or wheezes.BS decreased Heart:  Normal rate and regular rhythm. S1 and S2 normal without gallop, murmur, click, rub .S4 Cervical Nodes:  No lymphadenopathy noted Axillary Nodes:  No palpable lymphadenopathy   Impression & Recommendations:  Problem # 1:  SINUSITIS- ACUTE-NOS (ICD-461.9)  His updated medication list for this problem includes:    Fluticasone Propionate 50 Mcg/act Susp (Fluticasone propionate) .Marland Kitchen... 1 spray  each nostril two times a day as needed    Amoxicillin 500 Mg Caps (Amoxicillin) .Marland Kitchen... 1 three times a day  Orders: Prescription Created Electronically [phone])  Complete Medication List: 1)  Pravachol 40 Mg Tabs (Pravastatin sodium) .... 1/2 qhs 2)  Amlodipine Besylate 10 Mg Tabs (Amlodipine besylate) .... 1/2 tab qhs 3)  Buspirone Hcl 15 Mg Tabs (Buspirone hcl) .... 1/2 by mouth bid 4)  Multivitamin  5)  Caltrate 600+d 600-400 Mg-unit Tabs (Calcium carbonate-vitamin d) .Marland Kitchen.. 1 bid 6)  Fiber 3 Daily  7)  Vit D 1/2 Qd  8)  Zyrtec Allergy 10 Mg Tabs (Cetirizine hcl) .... Take 1 tablet by mouth once a day 9)   Fosamax 70 Mg Tabs (Alendronate sodium) .Marland Kitchen.. 1 by mouth q week 10)  Albuterol Sulfate (2.5 Mg/68ml) 0.083% Nebu (Albuterol sulfate) .... Use as needed for sob 11)  Loratadine 10 Mg Tabs (Loratadine) .Marland Kitchen.. 1 once daily prn 12)  Fish Oil 1000 Mg Caps (Omega-3 fatty acids) .Marland Kitchen.. 1 by mouth three times a day 13)  Fluticasone Propionate 50 Mcg/act Susp (Fluticasone propionate) .Marland Kitchen.. 1 spray each nostril two times a day as needed 14)  Zolpidem Tartrate 5 Mg Tabs (Zolpidem tartrate) .Marland Kitchen.. 1 at bedtime insomnia 15)  Ranitidine Hcl 150 Mg Tabs (Ranitidine hcl) .Marland Kitchen.. 1 by mouth every 12 hours pre meals 16)  Amoxicillin 500 Mg Caps (Amoxicillin) .Marland Kitchen.. 1 three times a day  Patient Instructions: 1)  Continue Neti pot once daily - two times a day as needed . Plain Mucinex for thick secretions. 2)  Drink as much fluid as you can tolerate for the next few days. Prescriptions: AMOXICILLIN 500 MG CAPS (AMOXICILLIN) 1 three times a day  #30 x 0   Entered and Authorized by:   Marga Melnick MD   Signed by:   Marga Melnick MD on 10/07/2009   Method used:   Faxed to ...       [facility]* (retail)       [address]       [place], [place]  [address]       Ph: [phone] or [phone]       Fax: [phone]   RxID:   [id number]

## 2010-03-21 NOTE — Progress Notes (Signed)
Summary: REFILL REQUEST  Phone Note Refill Request Call back at [phone] Message from:  Pharmacy on October 13, 2009 11:55 AM  Refills Requested: Medication #1:  PRAVACHOL 40 MG  TABS 1/2 qhs   Dosage confirmed as above?Dosage Confirmed   Supply Requested: 1 month [facility]  Next Appointment Scheduled: 10/25/09 Initial call taken by: Lavell Islam,  October 13, 2009 11:57 AM  Follow-up for Phone Call        Lipid/Hep 272.4/995.20 Follow-up by: Shonna Chock CMA,  October 13, 2009 4:08 PM    New/Updated Medications: PRAVACHOL 40 MG  TABS (PRAVASTATIN SODIUM) 1/2 at bedtime **APPOINTMENT DUE** Prescriptions: PRAVACHOL 40 MG  TABS (PRAVASTATIN SODIUM) 1/2 at bedtime **APPOINTMENT DUE**  #15 x 0   Entered by:   Shonna Chock CMA   Authorized by:   Marga Melnick MD   Signed by:   Shonna Chock CMA on 10/13/2009   Method used:   Electronically to        [facility]* (retail)       [address]       [place], [place]  [address]       Ph: [phone] or [phone]       Fax: [phone]   RxID:   [id number]

## 2010-03-21 NOTE — Progress Notes (Signed)
Summary: refill   Phone Note Refill Request   Refills Requested: Medication #1:  FOSAMAX 70 MG  TABS 1 by mouth q week via [facility]  Initial call taken by: Charolette Child,  August 28, 2007 10:49 AM      Prescriptions: FOSAMAX 70 MG  TABS (ALENDRONATE SODIUM) 1 by mouth q week  #4 x 11   Entered by:   Kandice Hams   Authorized by:   Marga Melnick MD   Signed by:   Kandice Hams on 08/28/2007   Method used:   Printed then faxed to ...       [facility]*       [address]       [place], [place]  [address]       Ph: [phone] or [phone]       Fax: [phone]   RxID:   [id number]

## 2010-03-21 NOTE — Progress Notes (Signed)
Summary: Refill Request  Phone Note Refill Request Call back at [phone] Message from:  Pharmacy on Jul 11, 2009 12:57 PM  Refills Requested: Medication #1:  FLUTICASONE PROPIONATE 50 MCG/ACT SUSP 1 spray each nostril two times a day as needed   Dosage confirmed as above?Dosage Confirmed   Supply Requested: 1 month [facility]  Next Appointment Scheduled: none Initial call taken by: Harold Barban,  Jul 11, 2009 12:58 PM    Prescriptions: FLUTICASONE PROPIONATE 50 MCG/ACT SUSP (FLUTICASONE PROPIONATE) 1 spray each nostril two times a day as needed  #1 x 3   Entered by:   Shonna Chock   Authorized by:   Marga Melnick MD   Signed by:   Shonna Chock on 07/11/2009   Method used:   Electronically to        [facility]* (retail)       [address]       [place], [place]  [address]       Ph: [phone] or [phone]       Fax: [phone]   RxID:   [id number]

## 2010-03-21 NOTE — Progress Notes (Signed)
Summary: congested - dr hopper  Phone Note Call from Patient Call back at [phone]   Summary of Call: patient wife carol wants appt toda - patient has been congested & coughing for 2 weeks - today he is naseous Initial call taken by: [name] [place],  January 01, 2007 11:30 AM  Follow-up for Phone Call        spoke with pt wif who says husband not been feelig well with sinus headache pressure woke up nauseated this am ov sched for tomorrow Follow-up by: Kandice Hams,  January 01, 2007 12:52 PM

## 2010-03-21 NOTE — Letter (Signed)
Summary: Results Follow up Letter  [place] [facility] [place]  [address] [place], [place] [address]   Phone: [phone]  Fax: [phone]    10/01/2007 MRN: [id number]  Robert Richard [address] [place], [place]  [address]  Dear Mr. Ambrocio,  The following are the results of your recent test(s):  Test         Result    Pap Smear:        Normal _____  Not Normal _____ Comments: ______________________________________________________ Cholesterol: LDL(Bad cholesterol):         Your goal is less than:         HDL (Good cholesterol):       Your goal is more than: Comments:  ______________________________________________________ Mammogram:        Normal _____  Not Normal _____ Comments:  ___________________________________________________________________ Hemoccult:        Normal __x___  Not normal _______ Comments:    _____________________________________________________________________ Other Tests:    We routinely do not discuss normal results over the telephone.  If you desire a copy of the results, or you have any questions about this information we can discuss them at your next office visit.   Sincerely,

## 2010-03-21 NOTE — Progress Notes (Signed)
Summary: rx req/ restless legs  Phone Note Call from Patient Call back at Home Phone [phone]   Caller: Spouse Call For: clance Summary of Call: pt wants rx for restless legs. says that kc told him at last ov that if he wanted this to just ask. [facility] [phone]./ pt [phone] Initial call taken by: Tivis Ringer,  November 19, 2008 10:34 AM  Follow-up for Phone Call        Spoke with pt's spouse.  She states that he is having increased kicking and leg cramps for the past wk.  States that this is keeping them both awake at night.  Would like rx for this please advise thanks! Vernie Murders  November 19, 2008 10:45 AM   Additional Follow-up for Phone Call Additional follow up Details #1::        please call in requip 0.5mg  1-2 after dinner as a trial.  He can start with one, but can go to 2 if helps but still having symptoms. #60, one fill he is to give me feedback. Additional Follow-up by: Barbaraann Share MD,  November 19, 2008 5:30 PM    Additional Follow-up for Phone Call Additional follow up Details #2::    [name] informed that rx was called to [facility].  Directions given per Dr Shelle Iron.  Instructed to cal  with feedback after starting med. Abigail Miyamoto RN  November 22, 2008 8:59 AM   New/Updated Medications: REQUIP 0.5 MG TABS (ROPINIROLE HCL) Take one to two tablets after dinner Prescriptions: REQUIP 0.5 MG TABS (ROPINIROLE HCL) Take one to two tablets after dinner  #60 x 1   Entered by:   Abigail Miyamoto RN   Authorized by:   Barbaraann Share MD   Signed by:   Abigail Miyamoto RN on 11/22/2008   Method used:   Telephoned to ...       [facility]* (retail)       [address]       [place], [place]  [address]       Ph: [phone] or [phone]       Fax: [phone]   RxID:   [id number]

## 2010-03-21 NOTE — Progress Notes (Signed)
Summary: REFILL REQUEST ON PANTOPRAZOLE SODIUM 40 MG  Phone Note Refill Request   Refills Requested: Medication #1:  PROTONIX 40 MG  TBEC 1 q am REFILL REQUEST VIA [facility] [phone] GENERIC( PANTOPRAZOLE SODIUM 40 MG)#30  Initial call taken by: Freddy Jaksch,  December 13, 2006 2:41 PM      Prescriptions: PROTONIX 40 MG  TBEC (PANTOPRAZOLE SODIUM) 1 q am  #30 x 3   Entered by:   Wendall Stade   Authorized by:   Marga Melnick MD   Signed by:   Wendall Stade on 12/13/2006   Method used:   Electronically sent to ...       [facility]       [address]       [place], [place]  [address]       Ph: [phone]       Fax: [phone]   RxID:   [id number]

## 2010-03-21 NOTE — Miscellaneous (Signed)
Summary: BONE DENSITY  Clinical Lists Changes  Orders: Added new Test order of T-Bone Densitometry (77080) - Signed Added new Test order of T-Lumbar Vertebral Assessment (77082) - Signed 

## 2010-03-21 NOTE — Assessment & Plan Note (Signed)
Summary: insomnia wants rx for sleeping pills/alr   Vital Signs:  Patient profile:   77 year old male Weight:      155 pounds BMI:     20.52 Temp:     97.4 degrees F oral Pulse rate:   60 / minute Resp:     17 per minute BP sitting:   130 / 82  (left arm) Cuff size:   regular  Vitals Entered By: Shonna Chock (November 29, 2008 9:15 AM) CC: Sleep concerns Comments REVIEWED MED LIST, PATIENT AGREED DOSE AND INSTRUCTION CORRECT    Primary Care Provider:  Marga Melnick MD  CC:  Sleep concerns.  History of Present Illness: He has insomnia 1-2X /month if he is planning activity next am. "I got my mind on that one thing".He goes to bed about 10 pm, usually he goes to sleep . No TV, reading or eating in bed. No alcohol or stimulants after evening meal. On Buspirone 15 mg 1/2 two times a day .  Allergies: 1)  ! * Ivp Dye  Review of Systems Resp:  Denies excessive snoring, hypersomnolence, and morning headaches; Occa apnea & snoring ONLY  if on back as per wife. MS:  Complains of cramps; denies muscle aches; Minor cramps  but RLS better with Requip.  Physical Exam  General:  Thin but in  no acute distress; alert,appropriate and cooperative throughout examination Neck:  No deformities, masses, or tenderness noted. Heart:  regular rhythm, bradycardia, and  rumbling grade 1 /6 systolic murmur.   Neurologic:  alert & oriented X3 and DTRs symmetrical and normal.   Psych:  memory intact for recent and remote and good eye contact.     Impression & Recommendations:  Problem # 1:  SLEEP DISORDER (ICD-780.50)  Problem # 2:  APNEA (ICD-786.03) positional only  Complete Medication List: 1)  Pravachol 40 Mg Tabs (Pravastatin sodium) .... 1/2 qhs 2)  Amlodipine Besylate 10 Mg Tabs (Amlodipine besylate) .... 1/2 tab qhs 3)  Buspirone Hcl 15 Mg Tabs (Buspirone hcl) .... 1/2 by mouth bid 4)  Multivitamin  5)  Caltrate 600+d 600-400 Mg-unit Tabs (Calcium carbonate-vitamin d) .Marland Kitchen.. 1 bid 6)   Fiber 3 Daily  7)  Vit D 1/2 Qd  8)  Zyrtec Allergy 10 Mg Tabs (Cetirizine hcl) .... Take 1 tablet by mouth once a day 9)  Fosamax 70 Mg Tabs (Alendronate sodium) .Marland Kitchen.. 1 by mouth q week 10)  Albuterol Sulfate (2.5 Mg/60ml) 0.083% Nebu (Albuterol sulfate) .... Use as needed for sob 11)  Loratadine 10 Mg Tabs (Loratadine) .Marland Kitchen.. 1 once daily prn 12)  Fish Oil 1000 Mg Caps (Omega-3 fatty acids) .Marland Kitchen.. 1 by mouth three times a day 13)  Fluticasone Propionate 50 Mcg/act Susp (Fluticasone propionate) .Marland Kitchen.. 1 spray each nostril two times a day as needed 14)  Requip 0.5 Mg Tabs (Ropinirole hcl) .... 1/2-1 at dinner time as needed 15)  Zolpidem Tartrate 5 Mg Tabs (Zolpidem tartrate) .Marland Kitchen.. 1 at bedtime insomnia  Patient Instructions: 1)  Do not take Buspiron within 2 hrs of Zolpidem 5 mg. Cal/Mag as needed  for cramps. Vitamin D 1000  IU once daily . Prescriptions: ZOLPIDEM TARTRATE 5 MG TABS (ZOLPIDEM TARTRATE) 1 at bedtime insomnia  #10 x 1   Entered and Authorized by:   Marga Melnick MD   Signed by:   Marga Melnick MD on 11/29/2008   Method used:   Print then Give to Patient   RxID:   [id number]

## 2010-03-21 NOTE — Progress Notes (Signed)
Summary: ASTEPRO NOT HELPING//HOP  Phone Note Call from Patient Call back at Home Phone [phone]   Caller: Spouse Summary of Call: pt was given ASTEPRO and is not helping at all, not opening head up would like a different med PLEASE CALL Initial call taken by: Kandice Hams,  Jul 08, 2008 10:52 AM  Follow-up for Phone Call        Fluticasone 1 spray two times a day as needed #1 Follow-up by: Marga Melnick MD,  Jul 08, 2008 5:55 PM  Additional Follow-up for Phone Call Additional follow up Details #1::        new rx faxed to pharmacy pt wife informed .Kandice Hams  Jul 09, 2008 9:13 AM  Additional Follow-up by: Kandice Hams,  Jul 09, 2008 9:13 AM    New/Updated Medications: FLUTICASONE PROPIONATE 50 MCG/ACT SUSP (FLUTICASONE PROPIONATE) 1 spray each nostril two times a day as needed   Prescriptions: FLUTICASONE PROPIONATE 50 MCG/ACT SUSP (FLUTICASONE PROPIONATE) 1 spray each nostril two times a day as needed  #1 x 0   Entered by:   Kandice Hams   Authorized by:   Marga Melnick MD   Signed by:   Kandice Hams on 07/09/2008   Method used:   Faxed to ...       [facility]* (retail)       [address]       [place], [place]  [address]       Ph: [phone] or [phone]       Fax: [phone]   RxID:   [id number]

## 2010-03-21 NOTE — Assessment & Plan Note (Signed)
Summary: flu shot//tl  Nurse Visit    Prior Medications: PROTONIX 40 MG  TBEC (PANTOPRAZOLE SODIUM) 1 q am PRAVACHOL 40 MG  TABS (PRAVASTATIN SODIUM) 1 by mouth qd    Influenza Vaccine    Vaccine Type: Fluvax MCR    Site: right deltoid    Mfr: Sanofi Pasteur    Dose: 0.5 ml    Route: IM    Given by: Doristine Devoid    Exp. Date: 07/20/2008    Lot #: [id number]  Flu Vaccine Consent Questions    Do you have a history of severe allergic reactions to this vaccine? no    Any prior history of allergic reactions to egg and/or gelatin? no    Do you have a sensitivity to the preservative Thimersol? no    Do you have a past history of Guillan-Barre Syndrome? no    Do you currently have an acute febrile illness? no    Have you ever had a severe reaction to latex? no    Vaccine information given and explained to patient? yes   Orders Added: 1)  Influenza Vaccine MCR [00025]    ]

## 2010-03-21 NOTE — Progress Notes (Signed)
Summary: hop--refill  Phone Note Refill Request   Refills Requested: Medication #1:  AMLODIPINE BESYLATE 10 MG  TABS 1/2 tab qhs   Last Refilled: 05/12/2007 Rx received via fax from [facility]--[phone]  Initial call taken by: Freddy Jaksch,  June 10, 2007 2:36 PM      Prescriptions: AMLODIPINE BESYLATE 10 MG  TABS (AMLODIPINE BESYLATE) 1/2 tab qhs  #15 x 3   Entered by:   Ardyth Man   Authorized by:   Wendall Stade   Signed by:   Ardyth Man on 06/10/2007   Method used:   Electronically sent to ...       [facility]*       [address]       [place], [place]  [address]       Ph: [phone] or [phone]       Fax: [phone]   RxID:   [id number]

## 2010-03-21 NOTE — Progress Notes (Signed)
Summary: REFILL FOR PROTONIX  Phone Note Refill Request   Refills Requested: Medication #1:  PROTONIX 40 MG  TBEC 1 q am   Last Refilled: 04/15/2007 RX RECEIVED VIA FAX FROM [facility] FAX IS [phone]  Initial call taken by: Job Founds,  May 12, 2007 11:45 AM      Prescriptions: PROTONIX 40 MG  TBEC (PANTOPRAZOLE SODIUM) 1 q am  #30 x 3   Entered by:   Wendall Stade   Authorized by:   Marga Melnick MD   Signed by:   Wendall Stade on 05/12/2007   Method used:   Electronically sent to ...       [facility]       [address]       [place], [place]  [address]       Ph: [phone]       Fax: [phone]   RxID:   [id number]

## 2010-03-23 NOTE — Progress Notes (Signed)
Summary: Refill request  Phone Note Refill Request Message from:  Fax from Pharmacy on February 09, 2010 10:00 AM  Refills Requested: Medication #1:  BUSPIRONE HCL 15 MG  TABS 1/2 by mouth bid [facility] -fax [phone]  Initial call taken by: [name] [place],  February 09, 2010 10:01 AM    Prescriptions: BUSPIRONE HCL 15 MG  TABS (BUSPIRONE HCL) 1/2 by mouth bid  #90 x 1   Entered by:   Shonna Chock CMA   Authorized by:   Marga Melnick MD   Signed by:   Shonna Chock CMA on 02/09/2010   Method used:   Electronically to        [facility]* (retail)       [address]       [place], [place]  [address]       Ph: [phone] or [phone]       Fax: [phone]   RxID:   [id number]

## 2010-03-24 NOTE — Therapy (Signed)
[facility]   Imported By: Lanelle Bal 12/07/2009 08:19:18  _____________________________________________________________________  External Attachment:    Type:   Image     Comment:   External Document

## 2010-03-27 ENCOUNTER — Telehealth (INDEPENDENT_AMBULATORY_CARE_PROVIDER_SITE_OTHER): Payer: Self-pay | Admitting: *Deleted

## 2010-04-06 NOTE — Progress Notes (Signed)
Summary: Refill Request  Phone Note Refill Request Call back at [phone] Message from:  Pharmacy on March 27, 2010 10:56 AM  Refills Requested: Medication #1:  FOSAMAX 70 MG  TABS 1 by mouth q week   Dosage confirmed as above?Dosage Confirmed   Brand Name Necessary? No   Supply Requested: 4   Last Refilled: 02/20/2010 [facility]  Next Appointment Scheduled: none Initial call taken by: Harold Barban,  March 27, 2010 10:56 AM    Prescriptions: FOSAMAX 70 MG  TABS (ALENDRONATE SODIUM) 1 by mouth q week  #4 x 11   Entered by:   Shonna Chock CMA   Authorized by:   Marga Melnick MD   Signed by:   Shonna Chock CMA on 03/27/2010   Method used:   Electronically to        [facility]* (retail)       [address]       [place], [place]  [address]       Ph: [phone] or [phone]       Fax: [phone]   RxID:   [id number]

## 2010-06-20 HISTORY — PX: LEG SURGERY: SHX1003

## 2010-06-30 ENCOUNTER — Emergency Department (HOSPITAL_COMMUNITY): Payer: Medicare Other

## 2010-06-30 ENCOUNTER — Emergency Department (HOSPITAL_COMMUNITY)
Admission: EM | Admit: 2010-06-30 | Discharge: 2010-06-30 | Disposition: A | Discharge status: Home / Self Care | Payer: Medicare Other | Attending: Emergency Medicine | Admitting: Emergency Medicine

## 2010-06-30 DIAGNOSIS — M79609 Pain in unspecified limb: Secondary | ICD-10-CM | POA: Insufficient documentation

## 2010-06-30 DIAGNOSIS — R252 Cramp and spasm: Secondary | ICD-10-CM | POA: Insufficient documentation

## 2010-06-30 DIAGNOSIS — E7800 Pure hypercholesterolemia, unspecified: Secondary | ICD-10-CM | POA: Insufficient documentation

## 2010-06-30 DIAGNOSIS — M810 Age-related osteoporosis without current pathological fracture: Secondary | ICD-10-CM | POA: Insufficient documentation

## 2010-06-30 DIAGNOSIS — J4489 Other specified chronic obstructive pulmonary disease: Secondary | ICD-10-CM | POA: Insufficient documentation

## 2010-06-30 DIAGNOSIS — I10 Essential (primary) hypertension: Secondary | ICD-10-CM | POA: Insufficient documentation

## 2010-06-30 DIAGNOSIS — J449 Chronic obstructive pulmonary disease, unspecified: Secondary | ICD-10-CM | POA: Insufficient documentation

## 2010-06-30 DIAGNOSIS — I739 Peripheral vascular disease, unspecified: Secondary | ICD-10-CM | POA: Insufficient documentation

## 2010-07-04 ENCOUNTER — Encounter (INDEPENDENT_AMBULATORY_CARE_PROVIDER_SITE_OTHER): Payer: 59 | Admitting: Vascular Surgery

## 2010-07-04 ENCOUNTER — Inpatient Hospital Stay (HOSPITAL_COMMUNITY)
Admission: AD | Admit: 2010-07-04 | Discharge: 2010-07-09 | DRG: 254 | Disposition: A | Discharge status: Home / Self Care | Payer: Medicare Other | Source: Ambulatory Visit | Attending: Vascular Surgery | Admitting: Vascular Surgery

## 2010-07-04 ENCOUNTER — Other Ambulatory Visit (HOSPITAL_COMMUNITY): Payer: 59

## 2010-07-04 DIAGNOSIS — K219 Gastro-esophageal reflux disease without esophagitis: Secondary | ICD-10-CM | POA: Diagnosis present

## 2010-07-04 DIAGNOSIS — I739 Peripheral vascular disease, unspecified: Secondary | ICD-10-CM | POA: Diagnosis present

## 2010-07-04 DIAGNOSIS — I743 Embolism and thrombosis of arteries of the lower extremities: Principal | ICD-10-CM | POA: Diagnosis present

## 2010-07-04 DIAGNOSIS — I70219 Atherosclerosis of native arteries of extremities with intermittent claudication, unspecified extremity: Secondary | ICD-10-CM

## 2010-07-04 DIAGNOSIS — I70229 Atherosclerosis of native arteries of extremities with rest pain, unspecified extremity: Secondary | ICD-10-CM

## 2010-07-04 DIAGNOSIS — J449 Chronic obstructive pulmonary disease, unspecified: Secondary | ICD-10-CM | POA: Diagnosis present

## 2010-07-04 DIAGNOSIS — Z87891 Personal history of nicotine dependence: Secondary | ICD-10-CM

## 2010-07-04 DIAGNOSIS — J4489 Other specified chronic obstructive pulmonary disease: Secondary | ICD-10-CM | POA: Diagnosis present

## 2010-07-04 DIAGNOSIS — I10 Essential (primary) hypertension: Secondary | ICD-10-CM | POA: Diagnosis present

## 2010-07-04 DIAGNOSIS — Z7901 Long term (current) use of anticoagulants: Secondary | ICD-10-CM

## 2010-07-04 LAB — POCT I-STAT, CHEM 8
BUN: 24 mg/dL — ABNORMAL HIGH (ref 6–23)
Calcium, Ion: 1.2 mmol/L (ref 1.12–1.32)
Chloride: 108 mEq/L (ref 96–112)
Creatinine, Ser: 1.3 mg/dL (ref 0.4–1.5)
Glucose, Bld: 106 mg/dL — ABNORMAL HIGH (ref 70–99)
HCT: 32 % — ABNORMAL LOW (ref 39.0–52.0)
Hemoglobin: 10.9 g/dL — ABNORMAL LOW (ref 13.0–17.0)
Potassium: 3.9 mEq/L (ref 3.5–5.1)
Sodium: 139 mEq/L (ref 135–145)
TCO2: 20 mmol/L (ref 0–100)

## 2010-07-05 ENCOUNTER — Inpatient Hospital Stay (HOSPITAL_COMMUNITY): Admission: RE | Admit: 2010-07-05 | Payer: Medicare Other | Source: Ambulatory Visit | Admitting: Vascular Surgery

## 2010-07-05 ENCOUNTER — Other Ambulatory Visit: Payer: Self-pay | Admitting: Vascular Surgery

## 2010-07-05 ENCOUNTER — Inpatient Hospital Stay (HOSPITAL_COMMUNITY): Payer: Medicare Other

## 2010-07-05 DIAGNOSIS — I743 Embolism and thrombosis of arteries of the lower extremities: Secondary | ICD-10-CM

## 2010-07-05 LAB — BASIC METABOLIC PANEL
BUN: 25 mg/dL — ABNORMAL HIGH (ref 6–23)
CO2: 20 mEq/L (ref 19–32)
Calcium: 8.9 mg/dL (ref 8.4–10.5)
Chloride: 106 mEq/L (ref 96–112)
Creatinine, Ser: 1.2 mg/dL (ref 0.4–1.5)
GFR calc Af Amer: >60 mL/min (ref >60)
GFR calc non Af Amer: 59 mL/min — ABNORMAL LOW (ref >60)
Glucose, Bld: 183 mg/dL — ABNORMAL HIGH (ref 70–99)
Potassium: 4.4 mEq/L (ref 3.5–5.1)
Sodium: 136 mEq/L (ref 135–145)

## 2010-07-05 LAB — CBC
HCT: 29.6 % — ABNORMAL LOW (ref 39.0–52.0)
Hemoglobin: 10.1 g/dL — ABNORMAL LOW (ref 13.0–17.0)
MCH: 28.9 pg (ref 26.0–34.0)
MCHC: 34.1 g/dL (ref 30.0–36.0)
MCV: 84.8 fL (ref 78.0–100.0)
Platelets: 199 10*3/uL (ref 150–400)
RBC: 3.49 MIL/uL — ABNORMAL LOW (ref 4.22–5.81)
RDW: 12.4 % (ref 11.5–15.5)
WBC: 8 10*3/uL (ref 4.0–10.5)

## 2010-07-05 LAB — SURGICAL PCR SCREEN
MRSA, PCR: NEGATIVE
Staphylococcus aureus: POSITIVE — AB

## 2010-07-05 LAB — HEPARIN LEVEL (UNFRACTIONATED)
Heparin Unfractionated: 0.11 IU/mL — ABNORMAL LOW (ref 0.30–0.70)
Heparin Unfractionated: <0.1 IU/mL — ABNORMAL LOW (ref 0.30–0.70)

## 2010-07-05 LAB — TYPE AND SCREEN
ABO/RH(D): O POS
Antibody Screen: NEGATIVE

## 2010-07-05 LAB — PROTIME-INR
INR: 1.1 (ref 0.00–1.49)
Prothrombin Time: 14.4 seconds (ref 11.6–15.2)

## 2010-07-05 LAB — ABO/RH: ABO/RH(D): O POS

## 2010-07-05 NOTE — H&P (Signed)
HISTORY AND PHYSICAL EXAMINATION  Jul 04, 2010  Re:  LINC, RENNE                DOB:  1930-09-10  CHIEF COMPLAINT:  Pain in the left leg for 5 days.  HISTORY OF PRESENT ILLNESS:  This 77 year old male patient last Thursday, 5 days ago, developed acute onset of pain in the left calf and foot while taking care of his free range chickens.  The pain has not resolved.  He went to the emergency department, where he was found to have decreased circulation of the left leg and an ABI of 0.32 with normal ABI on the right leg.  He was referred for further evaluation. He states that he has continued to have decreased sensation and some discomfort in the calf over the last 5 days and now walks with a cane. He had no claudication prior to this point.  He has no history of cardiac arrhythmias.  CHRONIC MEDICAL PROBLEMS: 1. Hypertension. 2. Hyperlipidemia. 3. COPD. 4. Negative for coronary artery disease, diabetes, or stroke.  SOCIAL HISTORY:  He is married, has 1 child.  Smoked 1-2 packs of cigarettes per day for 15 years but quit in 1964.  He does not use alcohol.  FAMILY HISTORY:  Positive for coronary artery disease in his mother and father.  Stroke in his father.  Negative for diabetes.  REVIEW OF SYSTEMS:  Positive for COPD.  Has had asthma in the past, takes no medicines now.  Has anemia, urinary frequency.  No other symptoms in a complete review of systems.  PHYSICAL EXAM:  Blood pressure 135/66, heart rate is 63, respirations 24.  General:  He is a well-developed, well-nourished, thin male in no apparent distress, alert and oriented x3.  HEENT:  Normal for age.  EOMs intact.  Lungs: Clear to auscultation.  No rhonchi or wheezing. Cardiovascular:  Regular rhythm.  No murmurs.  There are some occasional premature beats to palpation.  Carotid pulses are 3+ with no bruits. Abdomen: Soft, nontender with no masses.  Musculoskeletal: Free of major deformities.   Neurologic: Normal.  Skin: Free of rashes.  Lower extremity exam reveals 3+ femoral, popliteal, and a 1+ to 2+ dorsalis pedis pulse on the right.  The left leg has a 3+ femoral, absent popliteal and distal pulse.  There is sluggish capillary refill.  He does have some decreased sensation and no calf tenderness on physical exam.  He does have motion in the left foot.  I have reviewed all the information from the emergency department, and we will proceed today with emergent angiogram to see if this appears to be more of an embolic problem versus an atherosclerotic problem.  He will go to the hospital today for an emergency angiogram by Dr. Myra Gianotti or Edilia Bo, and I will plan either femoral embolectomy or femoral- popliteal bypass grafting tomorrow depending on the findings, unless an intervention is feasible.  Risks and benefits have been thoroughly discussed with the patient.  He would like to proceed.  FINAL DIAGNOSIS:  Ischemia of the left leg secondary to embolus versus atherosclerotic occlusion of the left superficial femoral artery.    Quita Skye Hart [place], M.D. Electronically Signed  JDL/MEDQ  D:  07/04/2010  T:  07/05/2010  Job:  [id number]  cc:   Titus Dubin. Alwyn Ren, MD,FACP,FCCP

## 2010-07-06 ENCOUNTER — Encounter: Payer: 59 | Admitting: Vascular Surgery

## 2010-07-06 DIAGNOSIS — I700 Atherosclerosis of aorta: Secondary | ICD-10-CM

## 2010-07-06 LAB — BASIC METABOLIC PANEL
BUN: 21 mg/dL (ref 6–23)
CO2: 22 mEq/L (ref 19–32)
Calcium: 8.6 mg/dL (ref 8.4–10.5)
Chloride: 107 mEq/L (ref 96–112)
Creatinine, Ser: 1 mg/dL (ref 0.4–1.5)
GFR calc Af Amer: >60 mL/min (ref >60)
GFR calc non Af Amer: >60 mL/min (ref >60)
Glucose, Bld: 154 mg/dL — ABNORMAL HIGH (ref 70–99)
Potassium: 4.3 mEq/L (ref 3.5–5.1)
Sodium: 136 mEq/L (ref 135–145)

## 2010-07-06 LAB — CBC
HCT: 26.8 % — ABNORMAL LOW (ref 39.0–52.0)
Hemoglobin: 9.1 g/dL — ABNORMAL LOW (ref 13.0–17.0)
MCH: 29.1 pg (ref 26.0–34.0)
MCHC: 34 g/dL (ref 30.0–36.0)
MCV: 85.6 fL (ref 78.0–100.0)
Platelets: 180 10*3/uL (ref 150–400)
RBC: 3.13 MIL/uL — ABNORMAL LOW (ref 4.22–5.81)
RDW: 12.5 % (ref 11.5–15.5)
WBC: 10.8 10*3/uL — ABNORMAL HIGH (ref 4.0–10.5)

## 2010-07-06 LAB — HEPARIN LEVEL (UNFRACTIONATED)
Heparin Unfractionated: <0.1 IU/mL — ABNORMAL LOW (ref 0.30–0.70)
Heparin Unfractionated: <0.1 IU/mL — ABNORMAL LOW (ref 0.30–0.70)

## 2010-07-06 LAB — PROTIME-INR
INR: 1.16 (ref 0.00–1.49)
Prothrombin Time: 15 seconds (ref 11.6–15.2)

## 2010-07-07 ENCOUNTER — Inpatient Hospital Stay (HOSPITAL_COMMUNITY): Payer: Medicare Other

## 2010-07-07 DIAGNOSIS — Z48812 Encounter for surgical aftercare following surgery on the circulatory system: Secondary | ICD-10-CM

## 2010-07-07 DIAGNOSIS — I743 Embolism and thrombosis of arteries of the lower extremities: Secondary | ICD-10-CM

## 2010-07-07 DIAGNOSIS — I739 Peripheral vascular disease, unspecified: Secondary | ICD-10-CM

## 2010-07-07 DIAGNOSIS — M79609 Pain in unspecified limb: Secondary | ICD-10-CM

## 2010-07-07 LAB — CBC
HCT: 25.3 % — ABNORMAL LOW (ref 39.0–52.0)
HCT: 27.8 % — ABNORMAL LOW (ref 39.0–52.0)
Hemoglobin: 8.7 g/dL — ABNORMAL LOW (ref 13.0–17.0)
Hemoglobin: 9.4 g/dL — ABNORMAL LOW (ref 13.0–17.0)
MCH: 29.2 pg (ref 26.0–34.0)
MCH: 29 pg (ref 26.0–34.0)
MCHC: 34.4 g/dL (ref 30.0–36.0)
MCHC: 33.8 g/dL (ref 30.0–36.0)
MCV: 84.9 fL (ref 78.0–100.0)
MCV: 85.8 fL (ref 78.0–100.0)
Platelets: 202 10*3/uL (ref 150–400)
Platelets: 233 10*3/uL (ref 150–400)
RBC: 2.98 MIL/uL — ABNORMAL LOW (ref 4.22–5.81)
RBC: 3.24 MIL/uL — ABNORMAL LOW (ref 4.22–5.81)
RDW: 12.7 % (ref 11.5–15.5)
RDW: 12.8 % (ref 11.5–15.5)
WBC: 10 10*3/uL (ref 4.0–10.5)
WBC: 9.9 10*3/uL (ref 4.0–10.5)

## 2010-07-07 LAB — PROTIME-INR
INR: 2.11 — ABNORMAL HIGH (ref 0.00–1.49)
Prothrombin Time: 23.8 seconds — ABNORMAL HIGH (ref 11.6–15.2)

## 2010-07-07 LAB — HEPARIN LEVEL (UNFRACTIONATED)
Heparin Unfractionated: 0.19 IU/mL — ABNORMAL LOW (ref 0.30–0.70)
Heparin Unfractionated: 0.38 IU/mL (ref 0.30–0.70)

## 2010-07-07 NOTE — Discharge Summary (Signed)
[name], [name] NO.:  [id number]   MEDICAL RECORD NO.:  [id number]          PATIENT TYPE:  INP   LOCATION:  2917                         FACILITY:  [facility]   PHYSICIAN:  Charlcie Cradle. Delford Field, MD, FCCPDATE OF BIRTH:  02/[birth year]   DATE OF ADMISSION:  02/25/2006  DATE OF DISCHARGE:  03/04/2006                               DISCHARGE SUMMARY   DISCHARGE DIAGNOSES:  1. Community acquired pneumonia, no organism specified, with severe      sepsis and resolved shock.  2. 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  3. Chronic obstructive pulmonary disease with continued treatment.  4. Positive cardiac enzymes without acute myocardial infarction.   HISTORY OF PRESENT ILLNESS:  [name] is a 77 year old white  male patient of Dr. Alwyn Ren who was admitted to the hospital on February 25, 2006 having had been ill for approximately 7 days.  He had fever,  diaphoresis, and he had described arthralgias.  He was coughing up  purulent yellow sputum and had a slight yellow nasal discharge.  He was  admitted for further evaluation and treatment.   PAST MEDICAL HISTORY:  1. Small bowel obstruction in 2002.  2. Diplopia as an out in 1996.  3. In September of 2006, he had duodenitis, esophagitis and endoscopic      for a prepyloric ulcer.  Colonoscopy revealed diverticulosis.  4. He had anemia in the setting of esophageal reflux in July of 2004.      Colonoscopy revealed diverticulosis.  5. Nasal surgery in 1986 and also 2005.  6. Remote hernioplasty.  7. Has had shoulder surgery.  8. 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   ALLERGIES:  IVP DYE.   LABORATORY DATA:  INR is 1.3.  Sodium is 134, potassium 3.5, chloride  105, CO2 of 21, glucose 107, BUN 20, creatinine 0.86.  Calcium is 8.0.  WBCs are 14.0.  Hemoglobin 10.1, hematocrit 29.5, platelets 351.  Arterial blood gas on a 50% BiPAP revealed pH of 7.409, PCO2 of  60.9  with a bicarbonate of 18.8.  His fibrinogen level was greater than 800.  D-dimer was 2.81.  Chest x-ray shows no overall significant change in  multifocal bilateral air space disease.  Limited CT of the maxillofacial  shows no significant sinus disease.  A 12-lead EKG shows normal sinus  rhythm with possible anterior infarction, age indeterminate.  Troponin I  is 0.07.  CK is 435.  CK-MB is 415.2.   HOSPITAL COURSE BY DISCHARGE DIAGNOSES:  1. Community acquired pneumonia, no organism specified, with acute      respiratory failure requiring non-invasive mechanical ventilatory      support along with severe sepsis and shock.  He was initially      admitted to the ICU and treated with the usual pharmaceutical      intervention with IV antibiotics, IV steroids, nebulized      bronchodilators and volume support.  He reached maximum hospital      benefit by March 04, 2006 and was ready  for discharge home.  He      was treated with 7 days of Rocephin and also treated with 5 days of      Zithromax and steroids which had been changed to a prednisone taper      by the day of discharge at 30 mg daily.  He will followup with Dr.      Shan Levans as an outpatient, as well as Dr. Alwyn Ren also.  2. Positive cardiac enzymes without myocardial infarction.  He was      evaluated by the cardiology service and found to have elevated      cardiac enzymes without any acute myocardial infarction.  A 12-lead      EKG was evaluated.  No further interventions were required at this      time.  3. Steroid-induced diabetes mellitus which is better with decreased      steroids.  Therefore, no further pharmaceutical intervention was      required on an outpatient basis.  4. Chronic obstructive pulmonary disease.  He will continue his      outpatient treatment and his p.r.n. nebulizers.   DISCHARGE MEDICATIONS:  1. Avelox 400 mg one a day until gone.  2. Albuterol 2.5 nebulizers as needed up to four times  a day.  3. BuSpar 7.5 mg three times a day.  4. Sular 10 mg one daily.  5. Protonix 40 mg one a day.  6. Prednisone on taper 30 mg for 4 days, 20 mg for 4 days, 10 mg for 4      days and then stop.   FOLLOWUP:  1. He has a followup appointment with Dr. Shan Levans on March 14, 2006 at 10:20 a.m.  2. He is to followup with Dr. Marga Melnick at his convenience.   DIET:  His diet is heart healthy and no concentrated sweets.   DISPOSITION/CONDITION ON DISCHARGE:  Improved.      Devra Dopp, MSN, ACNP      Charlcie Cradle. Delford Field, MD, [facility]  Electronically Signed    SM/MEDQ  D:  03/04/2006  T:  03/04/2006  Job:  [id number]   cc:   Titus Dubin. Alwyn Ren, MD,FACP,FCCP

## 2010-07-07 NOTE — H&P (Signed)
NAMEJESSI, Robert Richard NO.:  [id number]   MEDICAL RECORD NO.:  [id number]          PATIENT TYPE:  INP   LOCATION:  3715                         FACILITY:  [facility]   PHYSICIAN:  Titus Dubin. Hopper, MD,FACP,FCCPDATE OF BIRTH:  03/06/32   DATE OF ADMISSION:  02/25/2006  DATE OF DISCHARGE:                              HISTORY & PHYSICAL   Mr. Gambrell is a 77 year old white male with obstructive lung disease with  a reactive component, admitted now with fever, purulent secretions and  shortness of breath with hypoxemia.   He was seen originally on February 21, 2006 complaining of being ill for 3  days.  He had had fever the night before the office visit; he had  diaphoresis when the fever broke.  He described arthralgias.  He was  coughing yellow sputum with slight yellow nasal discharge.  He had no  nasal obstruction,or facial pain.  He did have anosmia, which was  chronic, present for years.  He had a right frontal headache at the time  of fever.  He denied halitosis, dental pain or earache.  He had been  employing saline nasal spray and Benadryl.  He did take the flu shot in  the fall.   He was afebrile with a temperature of 97.6 at that time.  He had mild  erythema of the oropharynx and dullness of the tympanic membranes.  His  chest was surprisingly clear.  He was slightly hoarse.  He had no  lymphadenopathy about the head, neck or axilla.  He was placed on Septra  DS every 12 hours.  His blood pressure was found to be 100/58, so his  Sular was decreased to 1/2 daily.   When seen emergently, January 7, he stated that he had become  progressively more short of breath since the January 3 visit and had  developed fevers.  He was using his nebulizer every 6 hours with only  partial response, but it had actually broken on January 6.  This was  manifested as the emission of minimal vapor from the machine.  He stated  that he had been coughing up to 2 cups of purulent  sputum a day since  the office visit.   In the office, his O2 saturations were 86% and temperature was found to  101.8.  He had rhonchi bilaterally with hyperventilation and dyspnea at  rest.  Admission is arranged for COPD exacerbation with possible  pneumonia.   PAST MEDICAL HISTORY:  Includes:  1. Small bowel obstruction in 2002.  2. He was treated for diplopia as an outpatient in 1996.  3. In September of 2006, he had duodenitis and esophagitis at      endoscopy with a prepyloric ulcer.  Colonoscopy revealed      diverticulosis.  4. He has had anemia in the setting of esophageal reflux.  5. In July of 2004, colonoscopy revealed diverticulosis.  6. He had nasal surgery in 1986 and also 2005.  7. He remotely had a hernioplasty.  8. He has had shoulder surgery.  9. 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   ALLERGIES:  HE IS ALLERGIC TO IVP DYE.   SOCIAL HISTORY:  He quit smoking in 1964 and does not drink.  He lives  with his wife.   MEDICATIONS:  Include:  1. Fosamax 70 mg weekly.  2. Protonix 40 mg daily.  3. Caltrate plus D twice a day for osteoporosis.  4. Duo-Nebs as needed in the nebulizer.  5. Buspirone 15 mg 1/2 twice a day.  6. Sular 20 mg 1/2 daily.   He states that has been no change in his headaches.  He has had no  significant purulent nasal discharge.  He has chronic anosmia.   He had diffuse substernal pain, both with cough, but also at rest.  This  was resolved with the application of nasal oxygen here in the office.   He has had cramps that were musculoskeletal in nature.  He had similar  symptoms when he previously was on steroids.   The chest pain he has had has been aggravated by cough.   He does have anxiety due to familial stressors.  He is on buspirone with  fairly good control.   Despite his history of diplopia there is no diagnosis of myasthenia.  He  has dyslipidemia.  His most recent lipids  revealed an LDL of 150 with  2150 total particles with 1600 small dense particles.  Medications were  recommended in September, but he had chosen to pursue therapeutic life  style changes with fasting lipids performed in February of 2008.   He has had extrinsic rhinoconjunctivitis, for which he has taken  fenofexadine  as needed.   He also has a history of anemia with normal B12 and folate levels.   He has been treated for nasal obstruction in the past with fluticasone  topically.   He has had no genitourinary symptoms with the present illness.  He has  had no change in his bowels, except for the absence of bowel movements  since January 3.   PHYSICAL EXAMINATION:  GENERAL:  He is acutely ill, hyperventilating.  VITAL SIGNS:  Temperature 101.8.  Pulse 107 at rest.  Weight 154.8.  Respiratory rate 28.  Blood pressure is 120/60.  HEENT:  Arterial narrowing is noted on fundal exam.  The oropharynx is  dry and erythematous with thick oral secretions.  LUNGS:  He has bilateral rhonchi, greater on the left in the lower  lobes.  HEART:  He exhibits an S4 tachycardia with a flow murmur. A pectus  excavatum is noted over the lower chest.  NEUROLOGIC:  Remarkable for generalized weakness without localizing  deficit.  ABDOMEN:  Reveals a ventral hernia.  He has no  lymphadenopathy.  There  is no organomegaly 0n  palpation of the abdomen.  SKIN:  Hot and dry.  EXTREMITIES:  Pedal pulses are decreased; he has no edema.  NEURO:  He is extremely anxious, but neuro/psychiatrically appropriate.   EKG revealed nonspecific ST/T-wave changes in 1 and AVL with some  decrease of T-voltage in the lateral V-leads.   He will be placed on telemetry with cardiac enzyme monitor.  Broad  spectrum antibiotic coverage will be initiated.  Chest film will be  performed to rule out pneumonia.  Aggressive pulmonary toilet will be  continued.      Titus Dubin. Alwyn Ren, MD,FACP,FCCP Electronically  Signed     [facility]/MEDQ  D:  02/25/2006  T:  02/26/2006  Job:  [id number]

## 2010-07-07 NOTE — Consult Note (Signed)
Robert Richard, Robert Richard                ACCOUNT NO.:  [id number]   MEDICAL RECORD NO.:  [id number]          PATIENT TYPE:  INP   LOCATION:  3715                         FACILITY:  [facility]   PHYSICIAN:  Noralyn Pick. Eden Emms, MD, FACCDATE OF BIRTH:  [birth year] 1932   DATE OF CONSULTATION:  02/25/2006  DATE OF DISCHARGE:                                 CONSULTATION   REFERRING PHYSICIAN:  Titus Dubin. Hopper, MD,FACP,FCCP   REASON FOR CONSULTATION:  Dyspnea and positive enzymes.   Robert Richard is a delightful 77 year old patient of Dr. Alwyn Ren who was  admitted to the hospital today with presumed COPD exacerbation and  pneumonia.  The patient smoked from the age of 61 to 69.   He has what he calls asthmatic bronchitis.  Dr. Frederik Pear note indicated  a chronic history of COPD.   The patient did have a nebulizer at home for the last few years.   He was admitted to the hospital with increasing shortness of breath.  He  was tried on antibiotics last week, but his cough continued with  increasing shortness of breath and desaturations in the ER to 85%.  He  was admitted with purulent sputum.  Gram stain is not back.  His fever  was 102.   From a cardiac perspective, he apparently had a normal Myoview last  year.  This was done in the setting of syncope.  At the time, he was  under a lot of stress.  As far as I can tell, his cardiac workup was  negative.   The patient really has not been having chest pain.  His chest hurts a  bit because he has been coughing so much, but there has really been no  exertional angina.  He says the bronchitis had been under good control  until he got this cold.  I am not sure if he had the Pneumovax or flu  shot this year.   PAST MEDICAL HISTORY:  Remarkable for:  1. Small-bowel obstruction in 2002.  2. He had some diplopia in 1996.  3. He has had previous colonoscopy for diverticulitis.  4. He has had previous herniorrhaphy.  5. Fracture of his left fingers.   ALLERGIES:   IVP DYE.  He immediately swells and had hives when he had  kidney stones back in 1964.   MEDICATIONS HERE IN THE HOSPITAL:  1. Zithromax 250 a day.  2. BuSpar 7.5 b.i.d.  3. Rocephin 2 g q. 24 h.  4. Sular 10 mg a day.  5. Protonix 40 a day.  6. Albuterol and Atrovent inhalers.   The patient's coronary risk factors include hypertension.   FAMILY HISTORY:  Remarkable for coronary disease in both parents but at  an advanced age.   The patient is retired from [employer].  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  He is usually fairly active.   His wife was with him today.  He has one son who is in good health.   REVIEW OF SYSTEMS:  His 10-point Review of Systems is otherwise  remarkable for increasing shortness of  breath, cough, purulent sputum,  low-grade fever.   The patient has never had a heart catheterization before.  He denies  lower extremity edema, history of DVT, or cancer.   He drinks alcohol occasionally.   PHYSICAL EXAMINATION:  GENERAL:  He is coughing.  VITAL SIGNS: He is in some respiratory distress with respiratory rate of  22.  His fever is down to 100. His pulse is 88-90 with occasional PACs.  HEENT:  Otherwise negative.  NECK:  There is no lymphadenopathy.  There is no thyromegaly.  There are  no carotid bruits.  LUNGS:  Diffuse rhonchi and wheezes.  CARDIAC:  S1, S2 with normal heart sounds.  ABDOMEN: Benign.  There is no AAA.  There is no hepatosplenomegaly.  EXTREMITIES:  Distal pulses are intact with no edema.   Chest x-ray shows infiltrates consistent with pneumonia including the  right upper lobe.  He has a tortuous aorta.   EKG is essentially normal except for sinus tachycardia with no evidence  of previous infarct or ischemia.   Lab work was remarkable for white blood cell count of 13.1, hematocrit  of 31, platelets 206.  Electrolytes were unremarkable with potassium  4.2, BUN 23, creatinine 1.4.  He does have borderline markers with  CPK  of 422, MB 6.8, relative index 1.6, troponin 0.09.   IMPRESSION:  The patient has a classic presentation for chronic  obstructive pulmonary disease exacerbation and pneumonia.  He is being  treated appropriately for this with Zithromax and Rocephin.   He has had a dose of steroids as well.  He needs to continue his  bronchodilators.   From a cardiac perspective, there is no good history for ischemia.  He  apparently had a normal Myoview a year ago.  His EKG shows no changes.  I suspect that his mild bump in CPKs are not clinically significant.  We  will continue to follow these as well as his ECGs.  We will do a 2-D  echocardiogram in the morning to assess for RV and LV function, rule out  cor pulmonale.   At least tonight, his respiratory status is somewhat tenuous, and he  will have to be watched closely.  I will also order a BNP in the  morning, although I suspect that it will be low.   Further recommendations will be based on the results of his  echocardiogram, serial CPKs and BNP.   I suspect the patient will probably be okay with an outpatient  dobutamine Myoview.   He will continue with Sular for blood pressure control unless his  clinical picture turns into sepsis.   Further recommendations will be given by his primary care service.  If  his pulmonary status worsens, I suspect they will get a pulmonary  consult.      Noralyn Pick. Eden Emms, MD, [facility]  Electronically Signed     PCN/MEDQ  D:  02/25/2006  T:  02/25/2006  Job:  [id number]

## 2010-07-07 NOTE — Assessment & Plan Note (Signed)
[facility]                             PULMONARY OFFICE NOTE   Robert Richard, SIVERTSEN                       MRN:          [id number]  DATE:03/14/2006                            DOB:          04-10-1930    [name] is a 77 year old white male, history of recent admission  between January 7 and January 14 for community-acquired pneumonia,  severe sepsis, septic shock, steroid-induced diabetes, COPD, positive  cardiac enzymes without evidence of acute myocardial infarction.  During  this hospitalization, he was enrolled in the [employer] Trial.  He received bilevel support and did not require mechanical ventilation.  He had evidence for community-acquired pneumonia, no organisms  specified.  He responded to IV antibiotics and IV steroids and nebulized  therapy and was transitioned over to oral Avelox which he has now  completed.  He has the:  1. Albuterol now 4 times daily as needed.  2. BuSpar 7.5 mg t.i.d.  3. Sular 10 mg daily.  4. Protonix 40 mg daily.  5. He has finished his course of prednisone.   EXAM:  Temperature 97.  Blood pressure 106/70.  Pulse 85.  Saturation  97% on room air.  CHEST:  Distant breath sounds without evidence of wheeze or rhonchi.  CARDIAC:  Regular rate and rhythm without S3.  Normal S1, S2.  ABDOMEN:  Soft, nontender.  EXTREMITIES:  No edema or clubbing.  SKIN:  Clear.   IMPRESSION:  Resolved community-acquired pneumonia, no organisms  specified with underlying chronic obstructive pulmonary disease, mild.   PLAN:  The patient to reduce albuterol to p.r.n. usage only by  nebulization.  No further pulmonary follow up is indicated.  A chest x-  ray today at the patient's time of exit will be performed.  He will be  returned to Dr. Marga Melnick for further primary care followup.  Followup with [facility] is p.r.n.     Robert Cradle Delford Field, MD, [facility]  Electronically Signed    PEW/MedQ  DD: 03/14/2006   DT: 03/14/2006  Job #: [id number]   cc:   Titus Dubin. Alwyn Ren, MD,FACP,FCCP

## 2010-07-08 DIAGNOSIS — I059 Rheumatic mitral valve disease, unspecified: Secondary | ICD-10-CM

## 2010-07-08 DIAGNOSIS — I441 Atrioventricular block, second degree: Secondary | ICD-10-CM

## 2010-07-08 LAB — CBC
HCT: 26.2 % — ABNORMAL LOW (ref 39.0–52.0)
Hemoglobin: 9 g/dL — ABNORMAL LOW (ref 13.0–17.0)
MCH: 29.2 pg (ref 26.0–34.0)
MCHC: 34.4 g/dL (ref 30.0–36.0)
MCV: 85.1 fL (ref 78.0–100.0)
Platelets: 219 10*3/uL (ref 150–400)
RBC: 3.08 MIL/uL — ABNORMAL LOW (ref 4.22–5.81)
RDW: 12.8 % (ref 11.5–15.5)
WBC: 9 10*3/uL (ref 4.0–10.5)

## 2010-07-08 LAB — BASIC METABOLIC PANEL
BUN: 18 mg/dL (ref 6–23)
CO2: 19 mEq/L (ref 19–32)
Calcium: 9.1 mg/dL (ref 8.4–10.5)
Chloride: 102 mEq/L (ref 96–112)
Creatinine, Ser: 1 mg/dL (ref 0.4–1.5)
GFR calc Af Amer: >60 mL/min (ref >60)
GFR calc non Af Amer: >60 mL/min (ref >60)
Glucose, Bld: 163 mg/dL — ABNORMAL HIGH (ref 70–99)
Potassium: 3.6 mEq/L (ref 3.5–5.1)
Sodium: 134 mEq/L — ABNORMAL LOW (ref 135–145)

## 2010-07-08 LAB — PROTIME-INR
INR: 3.71 — ABNORMAL HIGH (ref 0.00–1.49)
Prothrombin Time: 36.7 seconds — ABNORMAL HIGH (ref 11.6–15.2)

## 2010-07-08 LAB — PRO B NATRIURETIC PEPTIDE: Pro B Natriuretic peptide (BNP): 861.4 pg/mL — ABNORMAL HIGH (ref 0–450)

## 2010-07-09 LAB — PROTIME-INR
INR: 2.73 — ABNORMAL HIGH (ref 0.00–1.49)
Prothrombin Time: 29 seconds — ABNORMAL HIGH (ref 11.6–15.2)

## 2010-07-10 NOTE — Op Note (Signed)
NAMEBOCEPHUS, CALI                ACCOUNT NO.:  [id number]  MEDICAL RECORD NO.:  [id number]          PATIENT TYPE:  LOCATION:                                 FACILITY:  PHYSICIAN:  Quita Skye. Hart Rochester, M.D.       DATE OF BIRTH:  DATE OF PROCEDURE:  07/05/2010 DATE OF DISCHARGE:                              OPERATIVE REPORT   PREOPERATIVE DIAGNOSIS:  Ischemic left leg secondary to suspected popliteal embolus.  POSTOPERATIVE DIAGNOSIS:  Ischemic left leg secondary to suspected popliteal embolus.  OPERATIONS:  Left popliteal, anterior tibial, and peroneal artery embolectomy with patch angioplasty using bovine pericardial patch with intraoperative arteriogram left leg.  SURGEON:  Quita Skye. Hart Rochester, MD  FIRST ASSISTANT:  Newton Pigg, PA  ANESTHESIA:  General endotracheal.  PROCEDURE IN DETAILS:  The patient was taken to the operating room, placed in supine position, at which time satisfactory general endotracheal anesthesia was administered.  Left leg was prepped with Betadine scrub and solution draped in routine sterile manner.  Incision was made medially below the knee to expose the below-knee popliteal artery.  Popliteal space was entered.  There was some inflammatory reaction in the periadventitial area with thrombus palpable within the artery.  It was a mildly diseased vessel.  Anterior tibial vein was ligated at its origin and divided exposing the tibioperoneal trunk and the origin of the anterior tibial, peroneal and posterior tibial arteries.  All of which were mildly but diffusely diseased.  The patient was given 6000 units of heparin intravenous intravenously.  Popliteal artery was opened longitudinally with 15 blade, extended with Potts scissors down past the origin of the anterior tibial artery.  There was fresh organized thrombus which appears several days old.  However, Fogarty catheter #4 was passed proximally up to the common femoral artery and upon return,  organized thrombus retrieved followed by excellent inflow.  There appeared to be some atherosclerotic changes in the popliteal artery just at the knee joint level but I was able to easily pass a 4.5 dilator above this area and there was excellent inflow present.  Fogarty was then easily passed down the anterior tibial artery to the ankle with return of thrombus from the proximal portion, good backbleeding and was also passed down the peroneal artery with a more thrombus retrieve followed by good backbleeding and heparin saline be flushed into both vessels without difficulty.  I was never able to get a Fogarty to manipulate into the posterior tibial artery, although it did have some backbleeding.  Following this since the saphenous vein was very diminutive in size, piece of bovine pericardial patch was fashioned appropriately, sewn into place with 6-0 Prolene.  Following appropriate flushing, this was completed.  The vessel loop was released.  There was an excellent pulse in the artery.  Good Doppler flow in the posterior tibial, anterior tibial and peroneal arteries best being in the posterior tibial artery.  Intraoperative arteriogram revealed a widely patent distal superficial femoral and popliteal artery with mild diffuse disease and three-vessel runoff.  No protamine was given.  A Jackson-Pratt drain was brought out superiorly at the base  of the stab wound, secured with suture and the wound closed in layers with Vicryl in a subcuticular fashion with Dermabond.  The patient was taken to the recovery in stable condition.     Quita Skye Hart Rochester, M.D.     JDL/MEDQ  D:  07/05/2010  T:  07/06/2010  Job:  [id number]  Electronically Signed by Josephina Gip M.D. on 07/10/2010 10:25:59 AM

## 2010-07-11 NOTE — Op Note (Signed)
Robert Richard, Robert Richard                ACCOUNT NO.:  [id number]  MEDICAL RECORD NO.:  [id number]           PATIENT TYPE:  I  LOCATION:  [place]                         FACILITY:  [facility]  PHYSICIAN:  Charles E. Fields, MD  DATE OF BIRTH:  1931-01-27  DATE OF PROCEDURE:  07/04/2010 DATE OF DISCHARGE:                              OPERATIVE REPORT   PROCEDURE:  Aortogram with bilateral lower extremity runoff.  PREOPERATIVE DIAGNOSIS:  Rest pain, left foot.  POSTOPERATIVE DIAGNOSIS:  Rest pain, left foot.  ANESTHESIA:  Local with IV sedation.  OPERATIVE DETAILS:  After obtaining informed consent, the patient was brought to the [facility] lab.  The patient was placed in supine position on the angio table.  Both groins were prepped and draped in the usual sterile fashion.  Local anesthesia was infiltrated over the right common femoral artery.  Introducer needle was used to cannulate the right common femoral artery and a 0.035 Versacore wire threaded in the abdominal aorta under fluoroscopic guidance.  A 5-French sheath was then placed over the guidewire in the right common femoral artery.  This was thoroughly flushed with heparinized saline.  A 5-French pigtail catheter was placed over the guidewire in the abdominal aorta and abdominal aortogram was obtained.  Abdominal aortogram shows bilateral single renal arteries, which are patent.  The aorta is calcified, but the luminal surface of all the arteries is fairly smooth with no significant flow-limiting stenosis. The left and right common iliac arteries are patent.  The left and right internal and external iliac arteries are tortuous, but patent.  Next, the pigtail catheter was pulled down just above the aortic bifurcation and a pelvic angiogram was obtained.  This again confirms that the distal abdominal aorta was patent as well as the external internal and common iliac arteries.  The lower extremity runoff view is then obtained.  In the right  lower extremity, the right common femoral artery is patent. There is some motion artifact, but the right superficial femoral and profunda femoris arteries appear patent.  The right popliteal artery is patent.  The below-knee popliteal artery is patent.  The motion artifact is more severe in the lower leg.  The anterior tibial artery is patent. The posterior tibial artery and peroneal arteries appear to be patent to the leg, but again this was obscured by severe motion artifact at the calf and foot level.  Due to motion artifact to get better views of the left lower extremity, 5-French pigtail catheter was removed over a guidewire, and a 5-French crossover catheter brought up in the operative field.  This was used to first selectively catheterize the left common iliac artery and then using an 0.035 angled Glidewire, I advanced the Glidewire down into the left common femoral artery and the crossover catheter was exchanged for a five straight catheter, and this was advanced over the Glidewire into the distal left external iliac artery.  Left lower extremity arteriogram was then obtained.  This shows a patent left common femoral and profunda femoris artery.  The left superficial femoral artery is patent.  The left popliteal artery is patent to the  knee joint, and then there is a flush occlusion right at the level of the femoral condyle.  There is reconstitution of the distal tibioperoneal trunk via geniculate collaterals.  The posterior tibial artery is the dominant runoff to the foot.  However, the proximal anterior tibial artery and proximal peroneal arteries do opacify with contrast.  However, the arteries are diminutive distally in the leg most likely due to under opacification due to decreased flow in the popliteal artery.  At this point, the 5-French straight catheter was pulled back over a guidewire.  The 5-French sheath was left in place to be pulled in the holding  area.  OPERATIVE FINDINGS: 1. Occlusion of left popliteal artery most likely embolic in nature,     although underlying in situ.  Thrombosis cannot be excluded. 2. Runoff via the posterior tibial artery to the left foot but with     patent peroneal and anterior tibial arteries most likely patent all     the way to the foot, but diminished opacification by contrast due     to decreased flow. 3. Patent aortoiliac system bilaterally and patent lower extremity     arterial tree in the right lower extremity, although tibial vessel     views are obscured by motion artifact.     Janetta Hora. Fields, MD     CEF/MEDQ  D:  07/04/2010  T:  07/05/2010  Job:  [id number]  Electronically Signed by Fabienne Bruns MD on 07/11/2010 08:29:08 AM

## 2010-07-12 ENCOUNTER — Ambulatory Visit (INDEPENDENT_AMBULATORY_CARE_PROVIDER_SITE_OTHER): Payer: Medicare Other | Admitting: Internal Medicine

## 2010-07-12 ENCOUNTER — Encounter: Payer: Medicare Other | Admitting: *Deleted

## 2010-07-12 ENCOUNTER — Encounter: Payer: Self-pay | Admitting: Internal Medicine

## 2010-07-12 DIAGNOSIS — I82409 Acute embolism and thrombosis of unspecified deep veins of unspecified lower extremity: Secondary | ICD-10-CM

## 2010-07-12 DIAGNOSIS — I743 Embolism and thrombosis of arteries of the lower extremities: Secondary | ICD-10-CM

## 2010-07-12 DIAGNOSIS — Z7902 Long term (current) use of antithrombotics/antiplatelets: Secondary | ICD-10-CM

## 2010-07-12 LAB — POCT INR: INR: 3.4

## 2010-07-12 NOTE — Progress Notes (Signed)
  Subjective:    Patient ID: Robert Richard, male    DOB: 08-13-1930, 77 y.o.   MRN: [id number]  HPI An open procedure was required to remove the clot in the left lower extremity; this was completed by Dr. Hart Rochester. An attempt at catheterization to dissolve the clot was unsuccessful. This was attributed possibly to his  body habitus.  There was no definite trigger or predisposition to the clot. Neither he nor his family members having clotting problems such as deep venous thrombosis of pulmonary thromboemboli. Presumably coagulopathy profile was completed prior to initiation the Coumadin.  Since discharge he denies chest pain or pleurisy. Also in not having significant change in his chronic dyspnea. He denies paroxysmal nocturnal dyspnea. He is having pain and swelling in the left lower extremity.  He denies fever chills or sweats. The leg pain & swelling are improving. He's been prescribed narcotics for the pain.   Review of Systems     Objective:   Physical Exam he is in no acute distress. He is experiencing some  discomfort in the left leg.  Chest is clear to auscultation without rales, rhonchi, or wheezing. Breath sounds are somewhat decreased. He has a gallop type rhythm with a grade 1 systolic murmur  All pulses are intact although the pedal pulses are slightly decreased, especially the dorsalis pedis pulses & the left posterior tibial pulse. He has 1+ edema  the  left lower leg. There is no clinical suggestion of active infection around the operative site.         Assessment & Plan:   #1 popliteal embolus resulting in ischemia of the left lower leg; status post embolectomy with patch angioplasty  #2 edema of, postoperatively  Plan: Support hose will be prescribed for the left lower extremity. He'll be asked to report fever, chills, or red streaks going up the leg.  No change will be made in his present Coumadin dose. Rechecking  a PT/INR was recommended in 2 weeks

## 2010-07-12 NOTE — Patient Instructions (Signed)
Wear the stocking on the left lower extremity. Report  fever, chills, or red streaks going up the leg. Recheck the PT/INR in 2 weeks.

## 2010-07-13 ENCOUNTER — Other Ambulatory Visit: Payer: Self-pay

## 2010-07-13 MED ORDER — BUSPIRONE HCL 15 MG PO TABS: ORAL_TABLET | ORAL | Status: DC

## 2010-07-14 ENCOUNTER — Encounter: Payer: Self-pay | Admitting: Internal Medicine

## 2010-07-24 ENCOUNTER — Encounter: Payer: Self-pay | Admitting: Internal Medicine

## 2010-07-24 ENCOUNTER — Ambulatory Visit (INDEPENDENT_AMBULATORY_CARE_PROVIDER_SITE_OTHER): Payer: Medicare Other | Admitting: Internal Medicine

## 2010-07-24 ENCOUNTER — Telehealth: Payer: Self-pay | Admitting: *Deleted

## 2010-07-24 DIAGNOSIS — E785 Hyperlipidemia, unspecified: Secondary | ICD-10-CM

## 2010-07-24 DIAGNOSIS — I359 Nonrheumatic aortic valve disorder, unspecified: Secondary | ICD-10-CM

## 2010-07-24 DIAGNOSIS — I059 Rheumatic mitral valve disease, unspecified: Secondary | ICD-10-CM

## 2010-07-24 DIAGNOSIS — I050 Rheumatic mitral stenosis: Secondary | ICD-10-CM

## 2010-07-24 DIAGNOSIS — I749 Embolism and thrombosis of unspecified artery: Secondary | ICD-10-CM

## 2010-07-24 DIAGNOSIS — I10 Essential (primary) hypertension: Secondary | ICD-10-CM

## 2010-07-24 LAB — PROTIME-INR
INR: 5.2 ratio — ABNORMAL HIGH (ref 0.8–1.0)
Prothrombin Time: 49.4 s — ABNORMAL HIGH (ref 10.2–12.4)

## 2010-07-24 MED ORDER — WARFARIN SODIUM 2.5 MG PO TABS: 2.5000 mg | ORAL_TABLET | ORAL | Status: DC

## 2010-07-24 NOTE — Telephone Encounter (Signed)
Received report of PT/INR done today. PT 49.4, INR 5.2. Per Dr.  Tenny Craw pt to hold Coumadin for 2 days and follow up with [facility] in our office on 3rd day.  Called to give pt this information. Line busy. Will try to reach pt.

## 2010-07-24 NOTE — Telephone Encounter (Signed)
Left message on spouse's cell phone to call back. Home phone still busy.

## 2010-07-24 NOTE — Telephone Encounter (Signed)
Left message to call back on home phone. Unable to leave detailed message as no ID given on voice mail message.

## 2010-07-24 NOTE — Patient Instructions (Signed)
Your physician recommends that you schedule a follow-up appointment in: December with Dr. Tenny Craw Your physician has requested that you have an echocardiogram. Echocardiography is a painless test that uses sound waves to create images of your heart. It provides your doctor with information about the size and shape of your heart and how well your heart's chambers and valves are working. This procedure takes approximately one hour. There are no restrictions for this procedure. To be done in December on day of appointment with Dr. Tenny Craw

## 2010-07-25 ENCOUNTER — Ambulatory Visit (INDEPENDENT_AMBULATORY_CARE_PROVIDER_SITE_OTHER): Payer: Medicare Other | Admitting: Vascular Surgery

## 2010-07-25 DIAGNOSIS — M629 Disorder of muscle, unspecified: Secondary | ICD-10-CM

## 2010-07-25 DIAGNOSIS — M2420 Disorder of ligament, unspecified site: Secondary | ICD-10-CM

## 2010-07-25 NOTE — Telephone Encounter (Signed)
I talked with pt. Pt to aware to hold coumadin today 07/25/10 and tomorrow 07/26/10. I have given him an appointment for the coumadin clinic 07/27/10 at 11:15am. He is aware he will receive further coumadin dosing instructions after PT is done 07/27/10.

## 2010-07-26 ENCOUNTER — Ambulatory Visit: Payer: Medicare Other | Admitting: Internal Medicine

## 2010-07-26 ENCOUNTER — Ambulatory Visit: Payer: Medicare Other

## 2010-07-26 NOTE — Assessment & Plan Note (Signed)
OFFICE VISIT  Robert Richard, Robert Richard DOB:  15-Jul-1930                                       07/25/2010 [id number]#:[id number]  The patient underwent a left popliteal, anterior tibial and peroneal artery embolectomy with patch angioplasty on popliteal artery on May 16th, and he presented with an ischemic leg secondary to a popliteal embolus.  This occurred 5 days prior to his arrival to the emergency department.  He did not have a definite source identified where the embolus originated and was started on Coumadin therapy, which is followed by Dr. Alwyn Ren and/or the [facility].  His biggest complaint is some discomfort along the medial aspect of the left leg from his incision to the medial malleolus.  He denies any claudication symptoms, saying he is able to ambulate as far as he would like.  On physical exam today, blood pressure 125/64, heart rate 64, respirations 20.  Left below-knee popliteal incision is well healed with no evidence of infection or ulceration.  He has 3+ popliteal and 3+ posterior tibial pulse palpable with 1+ edema.  I reassured him regarding these findings.  He will return in 3 months to be checked in the vascular lab and will continue to be followed by Dr. Alwyn Ren and the Coumadin Clinic for management of his anticoagulation.  I explained to him because of the period of ischemia which occurred prior to his arrival for treatment and the medial incision, it is difficult to predict the secondary symptoms which he is experiencing in the medial aspect of his lower leg but that the circulation is excellent.    Quita Skye Hart Rochester, M.D. Electronically Signed  JDL/MEDQ  D:  07/25/2010  T:  07/26/2010  Job:  [id number]  cc:   Titus Dubin. Alwyn Ren, MD,FACP,FCCP

## 2010-07-27 ENCOUNTER — Ambulatory Visit (INDEPENDENT_AMBULATORY_CARE_PROVIDER_SITE_OTHER): Payer: Medicare Other | Admitting: *Deleted

## 2010-07-27 DIAGNOSIS — I050 Rheumatic mitral stenosis: Secondary | ICD-10-CM | POA: Insufficient documentation

## 2010-07-27 DIAGNOSIS — I749 Embolism and thrombosis of unspecified artery: Secondary | ICD-10-CM | POA: Insufficient documentation

## 2010-07-27 DIAGNOSIS — Z7901 Long term (current) use of anticoagulants: Secondary | ICD-10-CM

## 2010-07-27 LAB — POCT INR: INR: 2.7

## 2010-07-27 NOTE — Progress Notes (Signed)
HPI  Patient is a 77 year old who was recently discharged from the hospital.  He has no known CAD.  He was admitted after he suffered an embolic event to his L leg.  He underwent L popliteal embolectomy.  Echo during the admission showed normal LV systolic function  THe L atrium was moderately enlarged.  There was at least mild mitral stenosis. Since D/C his biggest complaint is pain in his L leg.  He is due to see Dr. Hart Rochester. He denies history of CP.  No palpitations.  No Known Allergies  Current Outpatient Prescriptions  Medication Sig Dispense Refill  . alendronate (FOSAMAX) 70 MG tablet Take 70 mg by mouth every 7 (seven) days. Take with a full glass of water on an empty stomach.       . AMBULATORY NON FORMULARY MEDICATION Lidocaine Hydrochlorid Spray, daily as needed       . amLODipine (NORVASC) 10 MG tablet Take 10 mg by mouth. 1/2 by mouth daily       . busPIRone (BUSPAR) 15 MG tablet 1/2 by mouth two times daily  90 tablet  2  . Calcium Carbonate-Vit D-Min (CALTRATE 600+D PLUS PO) Take by mouth 2 (two) times daily.        Marland Kitchen HYDROcodone-acetaminophen (NORCO) 5-325 MG per tablet Take 1 tablet by mouth. 1-2 by mouth every 4-6 hours as needed       . ketotifen (ZADITOR) 0.025 % ophthalmic solution Place 1 drop into both eyes daily as needed.        . Multiple Vitamin (MULTIVITAMIN) tablet Take 1 tablet by mouth daily.        . Omega-3 Fatty Acids (FISH OIL CONCENTRATE PO) Take 1,400 mg by mouth.        Bertram Gala Glycol-Propyl Glycol (SYSTANE) 0.4-0.3 % SOLN Apply to eye. 1 drop in each eye daily       . pravastatin (PRAVACHOL) 40 MG tablet Take 40 mg by mouth daily.        . ranitidine (ZANTAC) 150 MG tablet Take 150 mg by mouth 2 (two) times daily.        Marland Kitchen Specialty Vitamins Products (MAGNESIUM, AMINO ACID CHELATE,) 133 MG tablet Take 1 tablet by mouth daily.        Marland Kitchen topiramate (TOPAMAX) 25 MG capsule Take 25 mg by mouth. 3 by mouth in am       . vitamin B-12 (CYANOCOBALAMIN) 500 MCG  tablet Take 500 mcg by mouth daily.        Marland Kitchen warfarin (COUMADIN) 2.5 MG tablet Take 1 tablet (2.5 mg total) by mouth as directed. As directed based on PT/INR reading  30 tablet  0    Past Medical History  Diagnosis Date  . Anemia   . Colon polyp   . COPD (chronic obstructive pulmonary disease)   . Asthma with bronchitis   . Gout   . Osteoporosis   . Allergic rhinitis   . Skin cancer, basal cell   . Nephrolithiasis   . HLD (hyperlipidemia)     Past Surgical History  Procedure Date  . Finger surgery   . Colonoscopy w/ polypectomy   . Inguinal hernia repair   . Nose surgery     Septal Deviation    Family History  Problem Relation Age of Onset  . Allergies Mother   . Allergies Father   . Allergies Sister   . Allergies Brother     x2  . Coronary artery disease Mother   .  Stroke Father   . Heart attack Father 69  . Asthma Father     History   Social History  . Marital Status: Married    Spouse Name: N/A    Number of Children: N/A  . Years of Education: N/A   Occupational History  . retired Therapist, music Tobacco   Social History Main Topics  . Smoking status: Former Smoker    Quit date: 02/19/1961  . Smokeless tobacco: Not on file  . Alcohol Use: No  . Drug Use: No  . Sexually Active: Not on file   Other Topics Concern  . Not on file   Social History Narrative  . No narrative on file    Review of Systems:  All systems reviewed.  They are negative to the above problem except as previously stated.  Vital Signs: BP 131/74  Pulse 64  Ht 6\' 1"  (1.854 m)  Wt 144 lb (65.318 kg)  BMI 19.00 kg/m2  Physical Exam  Patient is in NAD  HEENT:  Normocephalic, atraumatic. EOMI, PERRLA.  Neck: JVP is normal. No thyromegaly. No bruits.  Lungs: clear to auscultation. No rales no wheezes.  Heart: Regular rate and rhythm. Normal S1, S2. No S3.   Gr I/VI diastolic murmur.Marland Kitchen PMI not displaced.  Abdomen:  Supple, nontender. Normal bowel sounds. No masses. No  hepatomegaly.  Extremities:   Good distal pulses throughout. No lower extremity edema.   Incision on L leg dry. Musculoskeletal :moving all extremities.  Neuro:   alert and oriented x3.  CN II-XII grossly intact.     Assessment and Plan:

## 2010-07-27 NOTE — Assessment & Plan Note (Signed)
It looks like patient has had good result.  I told him to discuss pain with Dr. Hart Rochester. Patient had echo, not TEE.  Echo though showed LA was enlarged with mild MS.  I think this sets him up for clot formation.  May have intermitt afib.  I would recomm continued coumadin indefinitely givien severity of event.

## 2010-07-27 NOTE — Assessment & Plan Note (Signed)
Mild.  Will need to be followed.

## 2010-07-27 NOTE — Assessment & Plan Note (Signed)
Good control

## 2010-07-31 NOTE — Discharge Summary (Signed)
Robert Richard, Robert Richard NO.:  [id number]  MEDICAL RECORD NO.:  [id number]  LOCATION:  2012                         FACILITY:  [facility]  PHYSICIAN:  Quita Skye. Hart Rochester, M.D.  DATE OF BIRTH:  04/24/1930  DATE OF ADMISSION:  07/04/2010 DATE OF DISCHARGE:  07/09/2010                              DISCHARGE SUMMARY   ADMISSION DIAGNOSIS:  Ischemic left leg secondary to suspected popliteal embolus.  HISTORY OF PRESENT ILLNESS:  This is a 77 year old male with no previous history of cardiac issues.  He has lower extremity pain that was severe and fairly sudden in onset.  He went to the emergency room on Jun 30, 2010 where his ABIs were decreased to 0.320 on the left.  He was referred to Dr. Hart Rochester at that time.  HOSPITAL COURSE:  The patient was admitted to the hospital and taken to the operating room on Jul 05, 2010, where he underwent a left popliteal, anterior tibial and peroneal artery embolectomy with patch angioplasty using bovine pericardial patch with intraoperative arteriogram of the left leg.  He tolerated the procedure well and was transported to the recovery room in satisfactory condition.  Postoperatively, the patient was placed on heparin drip.  He was also started on Coumadin. Cardiology was consulted for a questionable source of embolus.  By postoperative day #2, his INR is therapeutic at 2.1.  By postoperative day #3, the patient did have a 3-second pause per the nursing report. He was asymptomatic and EKG revealed sinus rhythm and an incomplete right bundle-branch block.  Also that day, his Coumadin was held due to his INR is 3.7, heparin was also discontinued postoperatively.  A TEE was ordered, but cannot be performed due to the probe pass was unsuccessful.  A transthoracic echo was done and it was found that he had an ejection fraction of 75-80% and his wall motion was normal.  He had a moderately severe calcified annulus of his mitral valve and  mild regurgitation.  He had mild tricuspid regurgitation.  The patient was able to be discharge from Cardiology standpoint and therefore was discharged on postoperative day #5.  Otherwise, his postoperative course include increasing ambulation as well as increasing intake of solids without difficulty.  DISCHARGE INSTRUCTIONS:  He was discharged home with extensive instructions on wound care and progressive ambulation.  He was instructed not to drive or perform any heavy lifting until return to see Dr. Hart Rochester in his office.  DISCHARGE DIAGNOSES: 1. Ischemic left leg secondary to suspected popliteal embolus.     a.     Status post left popliteal, anterior tibial and peroneal      artery embolectomy with patch angioplasty and intraoperative      arteriogram on Jul 05, 2010. 2. Chronic obstructive pulmonary disease and asthmatic bronchitis. 3. Hypertension. 4. Hyperlipidemia. 5. History of anemia. 6. Gout. 7. Osteoporosis. 8. History of basal cell cancer.  DISCHARGE MEDICATIONS: 1. Lidocaine hydrochloride spray one spray topically daily p.r.n. 2. Zaditor one drop both eyes daily p.r.n. 3. Systane drops one drop both eyes daily p.r.n. 4. Magnesium potassium OTC one tablet daily. 5. Fish oil 1400 mg p.o. daily. 6. Multivitamin p.o. daily. 7.  Vitamin B12 one p.o. daily. 8. Calcium citrate with vitamin D3 one p.o. daily. 9. Pravastatin 40 mg p.o. nightly. 10.Hydrocodone 5/325 one p.o. q.4-6 h. p.r.n. pain. 11.Ranitidine 150 mg p.o. q.12 h. 12.BuSpar 15 mg one half tablet p.o. b.i.d. 13.Alendronate 70 mg one p.o. weekly. 14.Amlodipine 10 mg p.o. daily. 15.Topiramate 25 mg three tablets p.o. q.a.m.  FOLLOWUP: 1. The patient is to follow up with Dr. Hart Rochester in 2 weeks. 2. The patient is to follow up in the Coumadin Clinic at [facility] on     Jul 10, 2010.    ______________________________ Newton Pigg, PA   ______________________________ Quita Skye. Hart Rochester,  M.D.    SE/MEDQ  D:  07/25/2010  T:  07/26/2010  Job:  [id number]  Electronically Signed by Newton Pigg PA on 07/27/2010 08:52:30 AM Electronically Signed by Josephina Gip M.D. on 07/31/2010 11:43:36 AM

## 2010-08-03 ENCOUNTER — Ambulatory Visit (INDEPENDENT_AMBULATORY_CARE_PROVIDER_SITE_OTHER): Payer: Medicare Other | Admitting: *Deleted

## 2010-08-03 DIAGNOSIS — Z7901 Long term (current) use of anticoagulants: Secondary | ICD-10-CM

## 2010-08-03 DIAGNOSIS — I749 Embolism and thrombosis of unspecified artery: Secondary | ICD-10-CM

## 2010-08-03 LAB — POCT INR: INR: 2.3

## 2010-08-03 MED ORDER — WARFARIN SODIUM 2.5 MG PO TABS: ORAL_TABLET | ORAL | Status: DC

## 2010-08-10 ENCOUNTER — Ambulatory Visit (INDEPENDENT_AMBULATORY_CARE_PROVIDER_SITE_OTHER): Payer: Medicare Other | Admitting: *Deleted

## 2010-08-10 DIAGNOSIS — I749 Embolism and thrombosis of unspecified artery: Secondary | ICD-10-CM

## 2010-08-10 DIAGNOSIS — Z7901 Long term (current) use of anticoagulants: Secondary | ICD-10-CM

## 2010-08-10 LAB — POCT INR: INR: 1.9

## 2010-08-24 ENCOUNTER — Ambulatory Visit (INDEPENDENT_AMBULATORY_CARE_PROVIDER_SITE_OTHER): Payer: Medicare Other | Admitting: *Deleted

## 2010-08-24 DIAGNOSIS — I749 Embolism and thrombosis of unspecified artery: Secondary | ICD-10-CM

## 2010-08-24 DIAGNOSIS — Z7901 Long term (current) use of anticoagulants: Secondary | ICD-10-CM

## 2010-08-24 LAB — POCT INR: INR: 1.6

## 2010-08-31 ENCOUNTER — Encounter: Payer: Medicare Other | Admitting: *Deleted

## 2010-09-07 ENCOUNTER — Ambulatory Visit (INDEPENDENT_AMBULATORY_CARE_PROVIDER_SITE_OTHER): Payer: Medicare Other | Admitting: *Deleted

## 2010-09-07 DIAGNOSIS — Z7901 Long term (current) use of anticoagulants: Secondary | ICD-10-CM

## 2010-09-07 DIAGNOSIS — I749 Embolism and thrombosis of unspecified artery: Secondary | ICD-10-CM

## 2010-09-07 LAB — POCT INR: INR: 1.6

## 2010-09-07 NOTE — Consult Note (Signed)
NAMEDEON, DUER                ACCOUNT NO.:  [id number]  MEDICAL RECORD NO.:  [id number]           PATIENT TYPE:  I  LOCATION:  [place]                         FACILITY:  [facility]  PHYSICIAN:  Veverly Fells. Excell Seltzer, MD  DATE OF BIRTH:  1931-02-12  DATE OF CONSULTATION:  07/06/2010 DATE OF DISCHARGE:                                CONSULTATION   PRIMARY CARE PHYSICIAN:  Dr. Alwyn Ren.  PRIMARY CARDIOLOGIST:  None.  CHIEF COMPLAINT:  Possible source of embolus.  HISTORY OF PRESENT ILLNESS:  Mr. Carter is a 77 year old male with no previous history of cardiac issues.  He had lower extremity pain that was severe and fairly sudden in onset.  He went to the emergency room on Jun 30, 2010.  His ABIs were decreased to 0.32 on the left.  He was referred to Dr. Hart Rochester, who saw him on May 15.  An aortogram that day showed a popliteal embolus.  He had surgery on May 16 for the popliteal embolus.  Dr. Hart Rochester was concerned about the possible source of the embolus and asked Cardiology to evaluate him.  Mr. Hanken has no history of chest pain.  He has no history of palpitations.  He has some dyspnea on exertion that is secondary to his COPD and asthmatic bronchitis history, but this is chronic and has not changed recently.  He has no history of presyncope or syncope.  He has had a couple of brief episodes of dizziness in the past, but cannot remember any more specifics about these and cannot correlate them with any other symptoms.  He has not had any recent trauma.  He has not been homebound or bed-bound recently by any acute illness.  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  He feels much better after the surgery.  PAST MEDICAL HISTORY: 1. Status post echocardiogram in 2008 showing an EF of 70% with no     wall motion abnormalities, mitral annular calcification with mild     mitral stenosis noted and a mean  gradient of 6, left atrial     diameter 55 and a CVP of 5. 2. COPD and asthmatic bronchitis. 3. Hypertension. 4. Hyperlipidemia. 5. History of anemia. 6. Gout. 7. Osteoporosis. 8. History of basal cell skin cancer.  SURGICAL HISTORY:  He is status post colonoscopy with polypectomy, skin cancer removal, hernia repair and nasal septal repair.  ALLERGIES:  IV dye.  CURRENT MEDICATIONS: 1. Heparin drip. 2. Norvasc 10 mg a day. 3. BuSpar 7.5 mg b.i.d. 4. Os-Cal daily. 5. Zinacef q.12 h. 6. Vitamin B12 daily. 7. D5 and half-normal saline. 8. Pepcid 20 mg b.i.d. 9. Mag-Ox 400 mg a day. 10.Multivitamin daily. 11.Mupirocin b.i.d. 12.Lovaza 1 gram daily. 13.Zocor 20 daily. 14.Topamax 75 a day. 15.Coumadin.  SOCIAL HISTORY:  He lives in [place] with his wife.  He is retired from [employer].  He has approximately 30-pack-year history of tobacco use, but quit more than 30 years ago.  He has no history of alcohol or drug abuse.  FAMILY HISTORY:  Both of his parents died with history of coronary artery disease and his father also had a stroke, but no siblings have cardiac issues.  REVIEW OF SYSTEMS:  He has a history of reflux symptoms and indigestion, but no melena.  He has not had fevers or chills in a long time, but after his legs began causing pain, he did have some problems with night sweats.  He does not cough or wheeze on a regular basis.  He has not had problems with unilateral weakness or numbness.  He gets occasional arthralgias and joint pains, but not edema.  Full 14 review of systems is otherwise negative except as stated in the HPI.  PHYSICAL EXAM:  VITAL SIGNS:  Temperature is 98.2, blood pressure 114/50, pulse 68, respiratory rate 14, O2 saturation 98% on room air. GENERAL:  He is a slender, elderly white male, in no acute distress. HEENT:  Normal. NECK:  There is no lymphadenopathy, thyromegaly, bruit, or JVD noted. CVA:  His heart is regular in rate and  rhythm with an S1-S2 and a 2/6 systolic murmur is noted at the apex.  Distal pulses are intact in both radials and in his right PT.  They are decreased in his left lower extremity but capillary refill is brisk. LUNGS:  Clear to auscultation bilaterally. SKIN:  No rashes or lesions are noted. ABDOMEN:  Soft and nontender with active bowel sounds. EXTREMITIES:  There is no cyanosis, clubbing, or edema noted. MUSCULOSKELETAL:  There is no joint deformity or effusions and no spine or CVA tenderness. NEURO:  He is alert and oriented.  Cranial nerves II-XII grossly intact.  Post procedure, left lower extremity angiogram was okay.  EKG sinus rhythm with PACs rate 69 with two brief episodes of SVT noted on telemetry.  LABORATORY VALUES:  Hemoglobin 9.1, hematocrit 26.8, WBCs 10.8, platelets 180.  Sodium 136, potassium 4.3, chloride 107, CO2 22, BUN 21, creatinine 1.0, glucose 154, INR 1.16.  IMPRESSION:  Mr. Burright was seen today by Dr. Excell Seltzer, the patient evaluated and the data reviewed.  We suspect an embolic event leading to left popliteal occlusion.  Cardioembolic source is the most likely etiology.  We recommend a TEE to rule out that source of embolism.  He has PSVT on telemetry but no atrial fib is seen yet although his left atrial diameter was 55 mm in 2008.  We agree with heparin and Coumadin and a likely lifelong anticoagulation.     Theodore Demark, PA-C   ______________________________ Veverly Fells. Excell Seltzer, MD    RB/MEDQ  D:  07/06/2010  T:  07/06/2010  Job:  [id number]  Electronically Signed by Theodore Demark PA-C on 08/03/2010 03:07:37 PM Electronically Signed by Tonny Bollman MD on 09/06/2010 11:56:42 PM

## 2010-09-21 ENCOUNTER — Ambulatory Visit (INDEPENDENT_AMBULATORY_CARE_PROVIDER_SITE_OTHER): Payer: Medicare Other | Admitting: *Deleted

## 2010-09-21 DIAGNOSIS — I749 Embolism and thrombosis of unspecified artery: Secondary | ICD-10-CM

## 2010-09-21 DIAGNOSIS — Z7901 Long term (current) use of anticoagulants: Secondary | ICD-10-CM

## 2010-09-21 LAB — POCT INR: INR: 1.7

## 2010-09-27 ENCOUNTER — Encounter: Payer: Self-pay | Admitting: Internal Medicine

## 2010-10-05 ENCOUNTER — Encounter: Payer: Medicare Other | Admitting: *Deleted

## 2010-10-05 ENCOUNTER — Ambulatory Visit (INDEPENDENT_AMBULATORY_CARE_PROVIDER_SITE_OTHER): Payer: Medicare Other | Admitting: *Deleted

## 2010-10-05 DIAGNOSIS — Z7901 Long term (current) use of anticoagulants: Secondary | ICD-10-CM

## 2010-10-05 DIAGNOSIS — I749 Embolism and thrombosis of unspecified artery: Secondary | ICD-10-CM

## 2010-10-05 LAB — POCT INR: INR: 1.6

## 2010-10-19 ENCOUNTER — Ambulatory Visit (INDEPENDENT_AMBULATORY_CARE_PROVIDER_SITE_OTHER): Payer: Medicare Other | Admitting: *Deleted

## 2010-10-19 DIAGNOSIS — Z7901 Long term (current) use of anticoagulants: Secondary | ICD-10-CM

## 2010-10-19 DIAGNOSIS — I749 Embolism and thrombosis of unspecified artery: Secondary | ICD-10-CM

## 2010-10-19 LAB — POCT INR: INR: 2.8

## 2010-10-25 ENCOUNTER — Encounter (INDEPENDENT_AMBULATORY_CARE_PROVIDER_SITE_OTHER): Payer: Medicare Other | Admitting: Vascular Surgery

## 2010-10-25 DIAGNOSIS — Z48812 Encounter for surgical aftercare following surgery on the circulatory system: Secondary | ICD-10-CM

## 2010-10-25 DIAGNOSIS — I739 Peripheral vascular disease, unspecified: Secondary | ICD-10-CM

## 2010-10-31 ENCOUNTER — Encounter: Payer: Self-pay | Admitting: Vascular Surgery

## 2010-11-02 ENCOUNTER — Ambulatory Visit (INDEPENDENT_AMBULATORY_CARE_PROVIDER_SITE_OTHER): Payer: Medicare Other | Admitting: *Deleted

## 2010-11-02 DIAGNOSIS — I749 Embolism and thrombosis of unspecified artery: Secondary | ICD-10-CM

## 2010-11-02 DIAGNOSIS — Z7901 Long term (current) use of anticoagulants: Secondary | ICD-10-CM

## 2010-11-02 LAB — POCT INR: INR: 2.5

## 2010-11-08 ENCOUNTER — Other Ambulatory Visit: Payer: Self-pay | Admitting: Internal Medicine

## 2010-11-09 ENCOUNTER — Other Ambulatory Visit: Payer: Self-pay | Admitting: Vascular Surgery

## 2010-11-09 DIAGNOSIS — I739 Peripheral vascular disease, unspecified: Secondary | ICD-10-CM

## 2010-11-09 DIAGNOSIS — Z48812 Encounter for surgical aftercare following surgery on the circulatory system: Secondary | ICD-10-CM

## 2010-11-09 MED ORDER — PRAVASTATIN SODIUM 40 MG PO TABS: 40.0000 mg | ORAL_TABLET | Freq: Every day | ORAL | Status: DC

## 2010-11-09 MED ORDER — RANITIDINE HCL 150 MG PO TABS: 150.0000 mg | ORAL_TABLET | Freq: Two times a day (BID) | ORAL | Status: DC

## 2010-11-09 NOTE — Telephone Encounter (Signed)
RXs sent to the pharmacy.

## 2010-11-10 ENCOUNTER — Other Ambulatory Visit: Payer: Self-pay

## 2010-11-10 MED ORDER — PRAVASTATIN SODIUM 40 MG PO TABS: ORAL_TABLET | ORAL | Status: DC

## 2010-11-10 NOTE — Telephone Encounter (Signed)
Addended by: Edgardo Roys on: 11/10/2010 11:39 AM   Modules accepted: Orders

## 2010-11-10 NOTE — Telephone Encounter (Signed)
Msg from patient who requested a refill on 30 mg of Pravachol to [facility]. Please advise    KP

## 2010-11-28 ENCOUNTER — Ambulatory Visit (INDEPENDENT_AMBULATORY_CARE_PROVIDER_SITE_OTHER): Payer: Medicare Other

## 2010-11-28 DIAGNOSIS — Z23 Encounter for immunization: Secondary | ICD-10-CM

## 2010-11-30 ENCOUNTER — Ambulatory Visit (INDEPENDENT_AMBULATORY_CARE_PROVIDER_SITE_OTHER): Payer: Medicare Other | Admitting: *Deleted

## 2010-11-30 DIAGNOSIS — I749 Embolism and thrombosis of unspecified artery: Secondary | ICD-10-CM

## 2010-11-30 DIAGNOSIS — Z7901 Long term (current) use of anticoagulants: Secondary | ICD-10-CM

## 2010-11-30 LAB — POCT INR: INR: 3.2

## 2010-12-05 ENCOUNTER — Other Ambulatory Visit: Payer: Self-pay | Admitting: Internal Medicine

## 2010-12-21 HISTORY — PX: NECK SURGERY: SHX720

## 2010-12-28 ENCOUNTER — Ambulatory Visit (INDEPENDENT_AMBULATORY_CARE_PROVIDER_SITE_OTHER): Payer: Medicare Other | Admitting: *Deleted

## 2010-12-28 DIAGNOSIS — I749 Embolism and thrombosis of unspecified artery: Secondary | ICD-10-CM

## 2010-12-28 DIAGNOSIS — Z7901 Long term (current) use of anticoagulants: Secondary | ICD-10-CM

## 2010-12-28 LAB — POCT INR: INR: 2

## 2011-01-09 ENCOUNTER — Other Ambulatory Visit: Payer: Self-pay | Admitting: Internal Medicine

## 2011-01-09 MED ORDER — AMLODIPINE BESYLATE 10 MG PO TABS: ORAL_TABLET | ORAL | Status: DC

## 2011-01-09 NOTE — Telephone Encounter (Signed)
RX sent

## 2011-01-12 ENCOUNTER — Ambulatory Visit (INDEPENDENT_AMBULATORY_CARE_PROVIDER_SITE_OTHER): Payer: Medicare Other | Admitting: *Deleted

## 2011-01-12 DIAGNOSIS — Z7901 Long term (current) use of anticoagulants: Secondary | ICD-10-CM

## 2011-01-12 DIAGNOSIS — I749 Embolism and thrombosis of unspecified artery: Secondary | ICD-10-CM

## 2011-01-12 LAB — POCT INR: INR: 2.2

## 2011-01-22 ENCOUNTER — Other Ambulatory Visit (HOSPITAL_COMMUNITY): Payer: Medicare Other | Admitting: Radiology

## 2011-01-22 ENCOUNTER — Ambulatory Visit: Payer: Medicare Other | Admitting: Internal Medicine

## 2011-02-06 ENCOUNTER — Other Ambulatory Visit: Payer: Self-pay | Admitting: Internal Medicine

## 2011-02-08 ENCOUNTER — Ambulatory Visit (INDEPENDENT_AMBULATORY_CARE_PROVIDER_SITE_OTHER): Payer: Medicare Other | Admitting: *Deleted

## 2011-02-08 DIAGNOSIS — Z7901 Long term (current) use of anticoagulants: Secondary | ICD-10-CM

## 2011-02-08 DIAGNOSIS — I749 Embolism and thrombosis of unspecified artery: Secondary | ICD-10-CM

## 2011-02-08 LAB — POCT INR: INR: 2.2

## 2011-02-09 ENCOUNTER — Encounter: Payer: Medicare Other | Admitting: *Deleted

## 2011-03-01 ENCOUNTER — Ambulatory Visit (INDEPENDENT_AMBULATORY_CARE_PROVIDER_SITE_OTHER): Payer: Medicare Other | Admitting: Internal Medicine

## 2011-03-01 ENCOUNTER — Encounter: Payer: Self-pay | Admitting: Internal Medicine

## 2011-03-01 ENCOUNTER — Ambulatory Visit (HOSPITAL_COMMUNITY): Payer: Medicare Other | Attending: Internal Medicine | Admitting: Radiology

## 2011-03-01 VITALS — BP 148/72 | HR 62 | Ht 74.0 in | Wt 156.0 lb

## 2011-03-01 DIAGNOSIS — J4489 Other specified chronic obstructive pulmonary disease: Secondary | ICD-10-CM | POA: Insufficient documentation

## 2011-03-01 DIAGNOSIS — I10 Essential (primary) hypertension: Secondary | ICD-10-CM

## 2011-03-01 DIAGNOSIS — E785 Hyperlipidemia, unspecified: Secondary | ICD-10-CM

## 2011-03-01 DIAGNOSIS — I059 Rheumatic mitral valve disease, unspecified: Secondary | ICD-10-CM

## 2011-03-01 DIAGNOSIS — I359 Nonrheumatic aortic valve disorder, unspecified: Secondary | ICD-10-CM | POA: Insufficient documentation

## 2011-03-01 DIAGNOSIS — I749 Embolism and thrombosis of unspecified artery: Secondary | ICD-10-CM

## 2011-03-01 DIAGNOSIS — J449 Chronic obstructive pulmonary disease, unspecified: Secondary | ICD-10-CM | POA: Insufficient documentation

## 2011-03-01 DIAGNOSIS — I050 Rheumatic mitral stenosis: Secondary | ICD-10-CM

## 2011-03-01 NOTE — Patient Instructions (Signed)
Your physician has requested that you have an echocardiogram. Echocardiography is a painless test that uses sound waves to create images of your heart. It provides your doctor with information about the size and shape of your heart and how well your heart's chambers and valves are working. This procedure takes approximately one hour. There are no restrictions for this procedure.  Schedule ECHO for June 2013  Your physician wants you to follow-up in:  12 months You will receive a reminder letter in the mail two months in advance. If you don't receive a letter, please call our office to schedule the follow-up appointment.

## 2011-03-01 NOTE — Assessment & Plan Note (Signed)
Keep on pravachol.

## 2011-03-01 NOTE — Assessment & Plan Note (Signed)
BP is a little high.  I will not change.  Follow.

## 2011-03-01 NOTE — Assessment & Plan Note (Signed)
No evid of afib.  Needs lifelong coumadin.

## 2011-03-01 NOTE — Progress Notes (Signed)
HPI Patinet is a 78 year old with a history of L popliteal embolus and mild mitral stenosis.  I saw him in clinic in June. Also history of HTN and dyslipidemia.  NO documented afib.  The patient is also followed in coumadin clinic.  Since seen he has been doing well.  No SOB.  NO CP  NO palpitations.  No dizziness. Leg is healing well.  No signif problems.  Sees dr Hart Rochester in March Had melanoma resected R neck   Reported margins clear. No Known Allergies  Current Outpatient Prescriptions  Medication Sig Dispense Refill  . alendronate (FOSAMAX) 70 MG tablet Take 70 mg by mouth every 7 (seven) days. Take with a full glass of water on an empty stomach.       . AMBULATORY NON FORMULARY MEDICATION Lidocaine Hydrochlorid Spray, daily as needed       . amLODipine (NORVASC) 10 MG tablet 1/2 by mouth daily  45 tablet  1  . busPIRone (BUSPAR) 15 MG tablet 1/2 by mouth two times daily  90 tablet  2  . Calcium Carbonate-Vit D-Min (CALTRATE 600+D PLUS PO) Take by mouth 2 (two) times daily.        Marland Kitchen ketotifen (ZADITOR) 0.025 % ophthalmic solution Place 1 drop into both eyes daily as needed.        . Multiple Vitamin (MULTIVITAMIN) tablet Take 1 tablet by mouth daily.        . Omega-3 Fatty Acids (FISH OIL CONCENTRATE PO) Take 1,400 mg by mouth.        . pravastatin (PRAVACHOL) 40 MG tablet 1/2 by mouth daily  30 tablet  5  . ranitidine (ZANTAC) 150 MG tablet TAKE ONE TABLET TWICE DAILY  180 tablet  0  . SF 5000 PLUS 1.1 % CREA dental cream As directed      . Specialty Vitamins Products (MAGNESIUM, AMINO ACID CHELATE,) 133 MG tablet Take 1 tablet by mouth daily.        . vitamin B-12 (CYANOCOBALAMIN) 500 MCG tablet Take 500 mcg by mouth daily.        Marland Kitchen warfarin (COUMADIN) 2.5 MG tablet Take as directed by anticoagulation clinic  30 tablet  2  . topiramate (TOPAMAX) 25 MG capsule Take 25 mg by mouth. 3 by mouth in am         Past Medical History  Diagnosis Date  . Anemia   . Colon polyp   . COPD (chronic  obstructive pulmonary disease)     Astmatic bronchitis  . Asthma with bronchitis   . Gout     PMH  . Osteoporosis   . Allergic rhinitis   . Skin cancer, basal cell     Dr Margo Aye, [facility]  . Nephrolithiasis   . HLD (hyperlipidemia)     Past Surgical History  Procedure Date  . Finger surgery     for foreign body  . Colonoscopy w/ polypectomy     Dr Arlyce Dice  . Inguinal hernia repair   . Nose surgery     Septal Deviation    Family History  Problem Relation Age of Onset  . Allergies Mother   . Coronary artery disease Mother   . Allergy (severe) Mother   . Allergies Father   . Stroke Father   . Heart attack Father 32  . Asthma Father   . Allergy (severe) Father   . Allergies Sister   . Allergy (severe) Sister   . Allergies Brother     x2  .  Allergy (severe) Brother   . Allergy (severe) Brother     History   Social History  . Marital Status: Married    Spouse Name: N/A    Number of Children: N/A  . Years of Education: N/A   Occupational History  . retired Therapist, music Tobacco   Social History Main Topics  . Smoking status: Former Smoker -- 3.0 packs/day    Types: Cigarettes    Quit date: 02/19/1961  . Smokeless tobacco: Not on file   Comment: started at age 75  . Alcohol Use: No  . Drug Use: No  . Sexually Active: Not on file   Other Topics Concern  . Not on file   Social History Narrative  . No narrative on file    Review of Systems:  All systems reviewed.  They are negative to the above problem except as previously stated.  Vital Signs: BP 148/72  Pulse 62  Ht 6\' 2"  (1.88 m)  Wt 156 lb (70.761 kg)  BMI 20.03 kg/m2  Physical Exam Patient is in NAD HEENT:  Normocephalic, atraumatic. EOMI, PERRLA.  Neck: JVP is normal. No thyromegaly. Ques L bruit.  Wound r neck healing well..  Lungs: clear to auscultation. No rales no wheezes.  Heart: Regular rate and rhythm. Normal S1, S2. No S3.  Gr I-II/VI systolic murmur apex.  Gr I/VI diastolic murmur.Marland Kitchen PMI not  displaced.  Abdomen:  Supple, nontender. Normal bowel sounds. No masses. No hepatomegaly.  Extremities:   Good distal pulses throughout. No lower extremity edema.  Musculoskeletal :moving all extremities.  Neuro:   alert and oriented x3.  CN II-XII grossly intact.  EKG:  Sinus rhythm  62 bpm.  Occasional PAC.     Assessment and Plan:

## 2011-03-01 NOTE — Assessment & Plan Note (Signed)
Asymptomatic.  Will get echo in June.  F/U in 1 year.

## 2011-03-08 ENCOUNTER — Ambulatory Visit (INDEPENDENT_AMBULATORY_CARE_PROVIDER_SITE_OTHER): Payer: Medicare Other | Admitting: *Deleted

## 2011-03-08 DIAGNOSIS — I749 Embolism and thrombosis of unspecified artery: Secondary | ICD-10-CM

## 2011-03-08 DIAGNOSIS — Z7901 Long term (current) use of anticoagulants: Secondary | ICD-10-CM

## 2011-03-08 LAB — POCT INR: INR: 3.3

## 2011-04-05 ENCOUNTER — Ambulatory Visit (INDEPENDENT_AMBULATORY_CARE_PROVIDER_SITE_OTHER): Payer: Medicare Other | Admitting: *Deleted

## 2011-04-05 DIAGNOSIS — I749 Embolism and thrombosis of unspecified artery: Secondary | ICD-10-CM

## 2011-04-05 DIAGNOSIS — Z7901 Long term (current) use of anticoagulants: Secondary | ICD-10-CM

## 2011-04-05 LAB — POCT INR: INR: 2.7

## 2011-04-09 ENCOUNTER — Telehealth: Payer: Self-pay | Admitting: Internal Medicine

## 2011-04-09 NOTE — Telephone Encounter (Signed)
Refill: Alendronate sodium 70 mg tab. Take 1 tablet by mouth every week. Qty 4. Last fill 03-10-11

## 2011-04-10 ENCOUNTER — Other Ambulatory Visit: Payer: Self-pay | Admitting: Internal Medicine

## 2011-04-10 DIAGNOSIS — M810 Age-related osteoporosis without current pathological fracture: Secondary | ICD-10-CM

## 2011-04-10 MED ORDER — ALENDRONATE SODIUM 70 MG PO TABS: 70.0000 mg | ORAL_TABLET | ORAL | Status: DC

## 2011-04-10 NOTE — Telephone Encounter (Signed)
Rx sent 

## 2011-04-10 NOTE — Telephone Encounter (Signed)
Refill x3 months. Bone mineral density will be ordered

## 2011-04-10 NOTE — Telephone Encounter (Signed)
Last BMD 07/2008, Dr.Hopper please advise if ok to fill and place order for BMD

## 2011-04-12 ENCOUNTER — Telehealth: Payer: Self-pay | Admitting: *Deleted

## 2011-04-12 NOTE — Telephone Encounter (Signed)
[name] called stating they had a question about a prescription. No other information provided.   Best #: [phone]

## 2011-04-12 NOTE — Telephone Encounter (Signed)
I called patient's wife, she states she contacted the pharamacy  x 4 and they have not heard back from [place] on refill for fosamax  I called the pharmacy (Spoke with Elita Quick), med was received on 04/10/2011 and is ready for pick-up

## 2011-04-27 ENCOUNTER — Other Ambulatory Visit: Payer: Self-pay | Admitting: Internal Medicine

## 2011-05-02 ENCOUNTER — Encounter (INDEPENDENT_AMBULATORY_CARE_PROVIDER_SITE_OTHER): Payer: Medicare Other | Admitting: *Deleted

## 2011-05-02 ENCOUNTER — Ambulatory Visit (INDEPENDENT_AMBULATORY_CARE_PROVIDER_SITE_OTHER): Payer: Medicare Other | Admitting: *Deleted

## 2011-05-02 DIAGNOSIS — I739 Peripheral vascular disease, unspecified: Secondary | ICD-10-CM

## 2011-05-02 DIAGNOSIS — Z7901 Long term (current) use of anticoagulants: Secondary | ICD-10-CM

## 2011-05-02 DIAGNOSIS — Z48812 Encounter for surgical aftercare following surgery on the circulatory system: Secondary | ICD-10-CM

## 2011-05-02 DIAGNOSIS — I749 Embolism and thrombosis of unspecified artery: Secondary | ICD-10-CM

## 2011-05-02 LAB — POCT INR: INR: 3

## 2011-05-09 ENCOUNTER — Other Ambulatory Visit: Payer: Self-pay | Admitting: Internal Medicine

## 2011-05-10 ENCOUNTER — Other Ambulatory Visit: Payer: Self-pay | Admitting: Internal Medicine

## 2011-05-10 MED ORDER — RANITIDINE HCL 150 MG PO TABS: ORAL_TABLET | ORAL | Status: DC

## 2011-05-10 NOTE — Telephone Encounter (Signed)
Patient needs to schedule a CPX in 06/2011 or after

## 2011-05-10 NOTE — Telephone Encounter (Signed)
Refill for Randitine HCL 150MG  Tab Take 1-tablet twice daily Qty 180 Last fill date 12.18.12

## 2011-05-14 NOTE — Procedures (Unsigned)
BYPASS GRAFT EVALUATION  INDICATION:  Left leg embolectomy.  HISTORY: Diabetes:  No. Cardiac: Hypertension:  Yes. Smoking:  Previous. Previous Surgery:  Left popliteal, anterior tibial, and peroneal artery embolectomies with patch angioplasty on 07/05/2010.  SINGLE LEVEL ARTERIAL EXAM                              RIGHT              LEFT Brachial: Anterior tibial: Posterior tibial: Peroneal: Ankle/brachial index:  PREVIOUS ABI:  Date:  RIGHT:  LEFT:  LOWER EXTREMITY BYPASS GRAFT DUPLEX EXAM:  DUPLEX:  Triphasic Doppler waveforms noted throughout the left lower extremity arterial system with a velocity of 276 cm/s noted in the left popliteal artery.   IMPRESSION: 1. Patent embolectomy sites of the left lower extremity with an     elevated velocity as described above. 2. Bilateral ankle brachial indices are noted on a separate report.         ___________________________________________ Quita Skye. Hart Rochester, M.D.  CH/MEDQ  D:  05/03/2011  T:  05/03/2011  Job:  [id number]

## 2011-05-31 ENCOUNTER — Ambulatory Visit (INDEPENDENT_AMBULATORY_CARE_PROVIDER_SITE_OTHER): Payer: Medicare Other | Admitting: *Deleted

## 2011-05-31 DIAGNOSIS — I749 Embolism and thrombosis of unspecified artery: Secondary | ICD-10-CM

## 2011-05-31 DIAGNOSIS — Z7901 Long term (current) use of anticoagulants: Secondary | ICD-10-CM

## 2011-05-31 LAB — POCT INR: INR: 3.1

## 2011-06-26 ENCOUNTER — Ambulatory Visit (INDEPENDENT_AMBULATORY_CARE_PROVIDER_SITE_OTHER)
Admission: RE | Admit: 2011-06-26 | Discharge: 2011-06-26 | Disposition: A | Discharge status: Home / Self Care | Payer: Medicare Other | Source: Ambulatory Visit

## 2011-06-26 DIAGNOSIS — M810 Age-related osteoporosis without current pathological fracture: Secondary | ICD-10-CM

## 2011-06-28 ENCOUNTER — Ambulatory Visit (INDEPENDENT_AMBULATORY_CARE_PROVIDER_SITE_OTHER): Payer: Medicare Other

## 2011-06-28 DIAGNOSIS — I749 Embolism and thrombosis of unspecified artery: Secondary | ICD-10-CM

## 2011-06-28 DIAGNOSIS — Z7901 Long term (current) use of anticoagulants: Secondary | ICD-10-CM

## 2011-06-28 LAB — POCT INR: INR: 2.2

## 2011-07-04 ENCOUNTER — Telehealth: Payer: Self-pay

## 2011-07-04 NOTE — Telephone Encounter (Signed)
Called patient x 2, line busy. I will try to reach patient again later today

## 2011-07-05 ENCOUNTER — Other Ambulatory Visit: Payer: Self-pay | Admitting: Internal Medicine

## 2011-07-09 NOTE — Telephone Encounter (Signed)
Spoke with patient's wife she will check with the insurance company and call us back. Patient's wife stated she might wait until he has finished his current supply of Fosamax, she will discuss BMD conerns with her husband and call back at a later date.

## 2011-07-10 ENCOUNTER — Telehealth: Payer: Self-pay | Admitting: Internal Medicine

## 2011-07-10 NOTE — Telephone Encounter (Signed)
Information faxed to [employer] for benefit verification enrollment

## 2011-07-10 NOTE — Telephone Encounter (Signed)
[name] is returning your call from yesterday. Call back # [phone]

## 2011-07-10 NOTE — Telephone Encounter (Signed)
Patient's wife states she called the insurance company and they did not give her a direct answer. Patient is more interested in Reclast since it is once a year. Patient's wife is aware I will begin the process for Reclast and f/u with her once process complete on our end

## 2011-07-23 ENCOUNTER — Other Ambulatory Visit (HOSPITAL_COMMUNITY): Payer: Self-pay | Admitting: Internal Medicine

## 2011-07-23 DIAGNOSIS — I059 Rheumatic mitral valve disease, unspecified: Secondary | ICD-10-CM

## 2011-07-31 ENCOUNTER — Ambulatory Visit (HOSPITAL_COMMUNITY): Payer: Medicare Other | Attending: Internal Medicine

## 2011-07-31 DIAGNOSIS — J449 Chronic obstructive pulmonary disease, unspecified: Secondary | ICD-10-CM | POA: Insufficient documentation

## 2011-07-31 DIAGNOSIS — I059 Rheumatic mitral valve disease, unspecified: Secondary | ICD-10-CM

## 2011-07-31 DIAGNOSIS — E785 Hyperlipidemia, unspecified: Secondary | ICD-10-CM | POA: Insufficient documentation

## 2011-07-31 DIAGNOSIS — Z87891 Personal history of nicotine dependence: Secondary | ICD-10-CM | POA: Insufficient documentation

## 2011-07-31 DIAGNOSIS — J4489 Other specified chronic obstructive pulmonary disease: Secondary | ICD-10-CM | POA: Insufficient documentation

## 2011-07-31 DIAGNOSIS — I359 Nonrheumatic aortic valve disorder, unspecified: Secondary | ICD-10-CM | POA: Insufficient documentation

## 2011-07-31 DIAGNOSIS — I517 Cardiomegaly: Secondary | ICD-10-CM | POA: Insufficient documentation

## 2011-07-31 DIAGNOSIS — I10 Essential (primary) hypertension: Secondary | ICD-10-CM | POA: Insufficient documentation

## 2011-07-31 NOTE — Progress Notes (Signed)
Echocardiogram performed.  

## 2011-08-07 ENCOUNTER — Other Ambulatory Visit: Payer: Self-pay | Admitting: Internal Medicine

## 2011-08-07 NOTE — Telephone Encounter (Signed)
Caller: Carol/Spouse is calling with a question about Fosamax.The medication was written by Marga Melnick.  Wife stating she received a call from office that pt should stop Fosamax and she states she has called and left 4 messages with no reply. Please call her back at  [phone]  - +++OFFICE FOLLOWUP++++

## 2011-08-07 NOTE — Telephone Encounter (Signed)
Both OK X3 mos 

## 2011-08-07 NOTE — Telephone Encounter (Signed)
I spoke with patient's wife about her leaving 4 messages with no return call. [name] states someone called her last week and stated they will f/u on reclast status and she has not heard anything back, I was unable to track to whom she had spoken with. I apologized that no one had called her back and informed her I will contact Reclast company and f/u and contact her back with a status update.   I called reclast verification and there services is no longer available for reclast when Generic.   I called Annabelle Harman (Reclast Rep for our office) she indicated she will come by tomorrow or Thursday to give me updated information on the New Reclast Process.   I called patient's wife back and gave her a status update, I will call her as soon as I get updated information.

## 2011-08-09 ENCOUNTER — Other Ambulatory Visit: Payer: Self-pay

## 2011-08-09 ENCOUNTER — Ambulatory Visit (INDEPENDENT_AMBULATORY_CARE_PROVIDER_SITE_OTHER): Payer: Medicare Other | Admitting: *Deleted

## 2011-08-09 DIAGNOSIS — I749 Embolism and thrombosis of unspecified artery: Secondary | ICD-10-CM

## 2011-08-09 DIAGNOSIS — Z7901 Long term (current) use of anticoagulants: Secondary | ICD-10-CM

## 2011-08-09 DIAGNOSIS — Z01818 Encounter for other preprocedural examination: Secondary | ICD-10-CM

## 2011-08-09 DIAGNOSIS — M810 Age-related osteoporosis without current pathological fracture: Secondary | ICD-10-CM

## 2011-08-09 LAB — POCT INR: INR: 3.3

## 2011-08-16 ENCOUNTER — Encounter: Payer: Self-pay | Admitting: Internal Medicine

## 2011-08-16 ENCOUNTER — Ambulatory Visit (INDEPENDENT_AMBULATORY_CARE_PROVIDER_SITE_OTHER): Payer: Medicare Other | Admitting: Internal Medicine

## 2011-08-16 ENCOUNTER — Other Ambulatory Visit (INDEPENDENT_AMBULATORY_CARE_PROVIDER_SITE_OTHER): Payer: Medicare Other

## 2011-08-16 VITALS — BP 136/78 | HR 73 | Temp 97.9°F | Wt 150.2 lb

## 2011-08-16 DIAGNOSIS — M810 Age-related osteoporosis without current pathological fracture: Secondary | ICD-10-CM

## 2011-08-16 DIAGNOSIS — Z01818 Encounter for other preprocedural examination: Secondary | ICD-10-CM

## 2011-08-16 DIAGNOSIS — J0110 Acute frontal sinusitis, unspecified: Secondary | ICD-10-CM

## 2011-08-16 MED ORDER — FLUTICASONE PROPIONATE 50 MCG/ACT NA SUSP: 1.0000 | Freq: Two times a day (BID) | NASAL | Status: DC | PRN

## 2011-08-16 MED ORDER — SILDENAFIL CITRATE 100 MG PO TABS: ORAL_TABLET | ORAL | Status: DC

## 2011-08-16 MED ORDER — AMOXICILLIN-POT CLAVULANATE 875-125 MG PO TABS: 1.0000 | ORAL_TABLET | Freq: Two times a day (BID) | ORAL | Status: AC

## 2011-08-16 NOTE — Progress Notes (Signed)
Labs only

## 2011-08-16 NOTE — Progress Notes (Signed)
  Subjective:    Patient ID: Robert Richard, male    DOB: September 30, 1930, 78 y.o.   MRN: [id number]  HPI He's had one week of purulent (yellow) nasal discharge with some bloody material. The postnasal drainage has induced  cough; cough is otherwise typically dry. He has had some frontal headache.  For several weeks he's had itchy, watery eyes  Symptoms have responded to nonsteroidals    Review of Systems  He denies facial pain, dental pain, sore throat,  otic pain, ear discharge,SOB or hemoptysis     Objective:   Physical Exam General appearance:thin but in good health ;well nourished; no acute distress or increased work of breathing is present.  No  lymphadenopathy about the head, neck, or axilla noted.   Eyes: No conjunctival inflammation or lid edema is present. .  Ears:  External ear exam shows no significant lesions or deformities.  Otoscopic examination reveals clear canals, tympanic membranes are intact bilaterally without bulging, retraction, inflammation or discharge.  Nose:  External nasal examination shows no deformity or inflammation. R nasal mucosa are dry with evidence of recent bleeding without lesions or exudates. Rightward  septal dislocation or deviation.Slight R  nares obstruction to airflow.   Oral exam: Dental hygiene is good; lips and gums are healthy appearing.There is no oropharyngeal erythema or exudate noted.   Neck:  No deformities, thyromegaly, masses, or tenderness noted.   Supple with full range of motion without pain.   Heart:  Normal rate and regular rhythm. S1 and S2 normal without gallop,  click, rub or other extra sounds. Grade 1/6 systolic murmur   Lungs:Chest clear to auscultation; no wheezes, rhonchi,rales ,or rubs present.No increased work of breathing.  Slight decreased BS LLL vs LLL  Extremities:  No cyanosis, edema. Minor clubbing  noted    Skin: Warm & dry w/o jaundice or tenting.          Assessment & Plan:

## 2011-08-16 NOTE — Patient Instructions (Addendum)
Plain Mucinex for thick secretions ;force NON dairy fluids . Use a Neti pot daily as needed for sinus congestion; going from open side to congested side . Nasal cleansing in the shower as discussed. Make sure that all residual soap is removed to prevent irritation. Fluticasone 1 spray in each nostril twice a day as needed. Use the "crossover" technique as discussed. Plain Allegra 160 daily as needed for itchy eyes & sneezing.    

## 2011-08-17 LAB — CREATININE, SERUM: Creatinine, Ser: 1.1 mg/dL (ref 0.4–1.5)

## 2011-08-17 LAB — CALCIUM: Calcium: 9.4 mg/dL (ref 8.4–10.5)

## 2011-08-21 ENCOUNTER — Encounter: Payer: Self-pay | Admitting: *Deleted

## 2011-08-30 ENCOUNTER — Ambulatory Visit (INDEPENDENT_AMBULATORY_CARE_PROVIDER_SITE_OTHER): Payer: Medicare Other | Admitting: *Deleted

## 2011-08-30 DIAGNOSIS — I749 Embolism and thrombosis of unspecified artery: Secondary | ICD-10-CM

## 2011-08-30 DIAGNOSIS — Z7901 Long term (current) use of anticoagulants: Secondary | ICD-10-CM

## 2011-08-30 LAB — POCT INR: INR: 2.4

## 2011-09-03 ENCOUNTER — Other Ambulatory Visit: Payer: Self-pay | Admitting: Internal Medicine

## 2011-09-03 DIAGNOSIS — M810 Age-related osteoporosis without current pathological fracture: Secondary | ICD-10-CM

## 2011-09-03 NOTE — Telephone Encounter (Signed)
I spoke with patient's wife, patient did receive results. Patient would like to move forward with appointment for Reclast. Order was placed, paperwork was faxed to [facility].

## 2011-09-03 NOTE — Telephone Encounter (Signed)
wants results from last labs looks like 6.27.13 was when he was in Wife states can call patient back at [phone]

## 2011-09-11 NOTE — Addendum Note (Signed)
Addended by: Karolee Ohs on: 09/11/2011 11:03 AM   Modules accepted: Orders

## 2011-09-11 NOTE — Progress Notes (Signed)
Received call from Dr Frederik Pear office that they were able to reach office. Writter was able to get through to pt's home number and schedule app't for reclast for 09/13/11

## 2011-09-11 NOTE — Progress Notes (Signed)
Received order for reclast. Attempted to call pt to schedule app't. Phone number is out of order. Notified Mardella Layman at Dr [facility] office. Labs will be out of date 09/13/11 for reclast. Will need to obtain order to redraw labs for infusion.

## 2011-09-13 ENCOUNTER — Encounter (HOSPITAL_COMMUNITY)
Admission: RE | Admit: 2011-09-13 | Discharge: 2011-09-13 | Disposition: A | Discharge status: Home / Self Care | Payer: Medicare Other | Source: Ambulatory Visit | Attending: Internal Medicine | Admitting: Internal Medicine

## 2011-09-13 ENCOUNTER — Encounter (HOSPITAL_COMMUNITY): Payer: Self-pay

## 2011-09-13 VITALS — BP 123/63 | HR 59 | Temp 97.7°F | Resp 16 | Ht 73.0 in | Wt 155.0 lb

## 2011-09-13 DIAGNOSIS — M810 Age-related osteoporosis without current pathological fracture: Secondary | ICD-10-CM | POA: Insufficient documentation

## 2011-09-13 MED ORDER — ZOLEDRONIC ACID 5 MG/100ML IV SOLN
5.0000 mg | Freq: Once | INTRAVENOUS | Status: AC
  Administered 2011-09-13: 5 mg via INTRAVENOUS
  Filled 2011-09-13: qty 100

## 2011-09-13 MED ORDER — SODIUM CHLORIDE 0.9 % IV SOLN
INTRAVENOUS | Status: AC
  Administered 2011-09-13: 250 mL via INTRAVENOUS

## 2011-09-15 ENCOUNTER — Other Ambulatory Visit: Payer: Self-pay | Admitting: Internal Medicine

## 2011-09-27 ENCOUNTER — Ambulatory Visit (INDEPENDENT_AMBULATORY_CARE_PROVIDER_SITE_OTHER): Payer: Medicare Other | Admitting: *Deleted

## 2011-09-27 DIAGNOSIS — Z7901 Long term (current) use of anticoagulants: Secondary | ICD-10-CM

## 2011-09-27 DIAGNOSIS — I749 Embolism and thrombosis of unspecified artery: Secondary | ICD-10-CM

## 2011-09-27 LAB — POCT INR: INR: 2

## 2011-10-17 ENCOUNTER — Encounter: Payer: Self-pay | Admitting: Internal Medicine

## 2011-10-17 ENCOUNTER — Other Ambulatory Visit: Payer: Self-pay | Admitting: Internal Medicine

## 2011-10-17 ENCOUNTER — Ambulatory Visit (INDEPENDENT_AMBULATORY_CARE_PROVIDER_SITE_OTHER): Payer: Medicare Other | Admitting: Internal Medicine

## 2011-10-17 VITALS — BP 124/70 | HR 60 | Temp 98.3°F | Wt 150.0 lb

## 2011-10-17 DIAGNOSIS — J209 Acute bronchitis, unspecified: Secondary | ICD-10-CM

## 2011-10-17 DIAGNOSIS — J069 Acute upper respiratory infection, unspecified: Secondary | ICD-10-CM

## 2011-10-17 MED ORDER — AMOXICILLIN-POT CLAVULANATE 500-125 MG PO TABS: ORAL_TABLET | ORAL | Status: DC

## 2011-10-17 NOTE — Patient Instructions (Addendum)
Plain Mucinex for thick secretions ;force NON dairy fluids . Use a Neti pot daily as needed for sinus congestion; going from open side to congested side . Nasal cleansing in the shower as discussed. Make sure that all residual soap is removed to prevent irritation. Fluticasone 1 spray in each nostril twice a day as needed. Use the "crossover" technique as discussed. Plain Allegra 160 daily as needed for itchy, watery  eyes & sneezing.

## 2011-10-17 NOTE — Progress Notes (Signed)
  Subjective:    Patient ID: Robert Richard, male    DOB: Dec 13, 1930, 78 y.o.   MRN: [id number]  HPI  "Sinus " Onset:1 week ago as nasal congestion Trigger:no  Course:progressive malaise; frontal headache, clear  Nasal discharge with some blood ;sneezing; watery, burning  eyes ; nocturnal sweating; yellow sputum. Greater volume of secretions from chest  Treatment/efficacy:Neti pot & nasal steroid  with benefit    Review of Systems No facial pain, dental pain,sorethroat, or ear ache/otic discharge No fever, chills No pleuritic pain,  hemoptysis, dyspnea, or wheezing.  "No asthma since using Neti pot"         Objective:   Physical Exam General appearance:thin but adequately nourished; no acute distress or increased work of breathing is present.  No  lymphadenopathy about the head, neck, or axilla noted.   Eyes: No conjunctival inflammation or lid edema is present.   Ears:  External ear exam shows no significant lesions or deformities.  Otoscopic examination reveals clear canals, tympanic membranes are intact bilaterally without bulging, retraction, inflammation or discharge.  Nose:  External nasal examination shows no deformity or inflammation. Nasal mucosa are pink and dry without lesions or exudates. Minor  septal  deviation.No obstruction to airflow.   Oral exam: Dental hygiene is good; lips and gums are healthy appearing.There is no oropharyngeal erythema or exudate noted.     Heart:  Normal rate and regular rhythm. S1 and S2 normal without gallop, murmur, click, rub or other extra sounds.   Lungs:Chest clear to auscultation; no wheezes, rhonchi,rales ,or rubs present.No increased work of breathing.    Extremities:  No cyanosis, edema, or clubbing  noted    Skin: Warm & dry           Assessment & Plan:  #1 acute bronchitis w/o bronchospasm #2 URI Plan: See orders and recommendations

## 2011-10-17 NOTE — Telephone Encounter (Signed)
refill amoxicillin/clavulan 875-125mg  tab #20 take one tablet by mouth every 12-hours, last fill 6.27.13  Last ov 6.27.13 acute  Dx listed as Sinusitis, acute frontal - Primary 461.1

## 2011-10-17 NOTE — Telephone Encounter (Signed)
I spoke with patient's wife and informed her that he will need an appointment for ABX. Scheduled patient an appointment for today at 2:00pm

## 2011-10-24 ENCOUNTER — Encounter: Payer: Self-pay | Admitting: Pharmacist

## 2011-10-25 ENCOUNTER — Ambulatory Visit (INDEPENDENT_AMBULATORY_CARE_PROVIDER_SITE_OTHER): Payer: Medicare Other | Admitting: Pharmacist

## 2011-10-25 DIAGNOSIS — I749 Embolism and thrombosis of unspecified artery: Secondary | ICD-10-CM

## 2011-10-25 DIAGNOSIS — Z7901 Long term (current) use of anticoagulants: Secondary | ICD-10-CM

## 2011-10-25 LAB — POCT INR: INR: 2.4

## 2011-11-08 ENCOUNTER — Other Ambulatory Visit: Payer: Self-pay | Admitting: Internal Medicine

## 2011-11-08 DIAGNOSIS — E785 Hyperlipidemia, unspecified: Secondary | ICD-10-CM

## 2011-11-08 DIAGNOSIS — T887XXA Unspecified adverse effect of drug or medicament, initial encounter: Secondary | ICD-10-CM

## 2011-11-08 MED ORDER — PRAVASTATIN SODIUM 40 MG PO TABS: ORAL_TABLET | ORAL | Status: DC

## 2011-11-08 NOTE — Telephone Encounter (Signed)
Future orders placed for patient to schedule appointment for labs

## 2011-11-08 NOTE — Telephone Encounter (Signed)
Pravastatin (Pravachol) 40 MG tablet Sig:1/2 by mouth daily

## 2011-11-13 ENCOUNTER — Ambulatory Visit (INDEPENDENT_AMBULATORY_CARE_PROVIDER_SITE_OTHER): Payer: Medicare Other

## 2011-11-13 DIAGNOSIS — Z23 Encounter for immunization: Secondary | ICD-10-CM

## 2011-12-04 ENCOUNTER — Other Ambulatory Visit: Payer: Self-pay | Admitting: Internal Medicine

## 2011-12-04 MED ORDER — PRAVASTATIN SODIUM 40 MG PO TABS: ORAL_TABLET | ORAL | Status: DC

## 2011-12-04 NOTE — Telephone Encounter (Signed)
PRAVACHOL 40 MG TABLET QTY:15 1/2 BY MOUTH DAILY

## 2011-12-06 ENCOUNTER — Ambulatory Visit (INDEPENDENT_AMBULATORY_CARE_PROVIDER_SITE_OTHER): Payer: Medicare Other | Admitting: *Deleted

## 2011-12-06 DIAGNOSIS — I749 Embolism and thrombosis of unspecified artery: Secondary | ICD-10-CM

## 2011-12-06 DIAGNOSIS — Z7901 Long term (current) use of anticoagulants: Secondary | ICD-10-CM

## 2011-12-06 LAB — POCT INR: INR: 3.2

## 2012-01-03 ENCOUNTER — Ambulatory Visit (INDEPENDENT_AMBULATORY_CARE_PROVIDER_SITE_OTHER): Payer: Medicare Other | Admitting: *Deleted

## 2012-01-03 DIAGNOSIS — I749 Embolism and thrombosis of unspecified artery: Secondary | ICD-10-CM

## 2012-01-03 DIAGNOSIS — Z7901 Long term (current) use of anticoagulants: Secondary | ICD-10-CM

## 2012-01-03 LAB — POCT INR
INR: 3.2
INR: 2.6

## 2012-01-07 ENCOUNTER — Other Ambulatory Visit: Payer: Self-pay

## 2012-01-07 ENCOUNTER — Telehealth: Payer: Self-pay | Admitting: Internal Medicine

## 2012-01-07 MED ORDER — AMLODIPINE BESYLATE 10 MG PO TABS: ORAL_TABLET | ORAL | Status: DC

## 2012-01-07 MED ORDER — PRAVASTATIN SODIUM 40 MG PO TABS: ORAL_TABLET | ORAL | Status: DC

## 2012-01-07 NOTE — Telephone Encounter (Signed)
RX's sent, patient is overdue for labs. Future orders already placed

## 2012-01-07 NOTE — Telephone Encounter (Signed)
Refill: Amlodipine besylate 10 mg tab. Take one-half tablet daily. Qty 45. Last fill 07-06-11  Pravastatin 40 mg tablet. Take 1/2 by mouth daily. Qty 15. Last fill 12-04-11

## 2012-01-07 NOTE — Telephone Encounter (Signed)
Pharmacy called [facility] to clarify directions for pt prachol. I called and advised pharmacy directions per Hop pt is to take 1/2 tab po daily. Pharmacy stated understanding.    MW

## 2012-01-09 ENCOUNTER — Other Ambulatory Visit: Payer: Medicare Other

## 2012-01-11 ENCOUNTER — Other Ambulatory Visit (INDEPENDENT_AMBULATORY_CARE_PROVIDER_SITE_OTHER): Payer: Medicare Other

## 2012-01-11 DIAGNOSIS — T887XXA Unspecified adverse effect of drug or medicament, initial encounter: Secondary | ICD-10-CM

## 2012-01-11 DIAGNOSIS — E785 Hyperlipidemia, unspecified: Secondary | ICD-10-CM

## 2012-01-11 LAB — LIPID PANEL
Cholesterol: 130 mg/dL (ref 0–200)
HDL: 44.6 mg/dL (ref >39.00)
LDL Cholesterol: 72 mg/dL (ref 0–99)
Total CHOL/HDL Ratio: 3
Triglycerides: 67 mg/dL (ref 0.0–149.0)
VLDL: 13.4 mg/dL (ref 0.0–40.0)

## 2012-01-11 LAB — HEPATIC FUNCTION PANEL
ALT: 22 U/L (ref 0–53)
AST: 27 U/L (ref 0–37)
Albumin: 4 g/dL (ref 3.5–5.2)
Alkaline Phosphatase: 48 U/L (ref 39–117)
Bilirubin, Direct: 0.1 mg/dL (ref 0.0–0.3)
Total Bilirubin: 0.8 mg/dL (ref 0.3–1.2)
Total Protein: 7.4 g/dL (ref 6.0–8.3)

## 2012-01-31 ENCOUNTER — Ambulatory Visit (INDEPENDENT_AMBULATORY_CARE_PROVIDER_SITE_OTHER): Payer: Medicare Other | Admitting: *Deleted

## 2012-01-31 DIAGNOSIS — Z7901 Long term (current) use of anticoagulants: Secondary | ICD-10-CM

## 2012-01-31 DIAGNOSIS — I749 Embolism and thrombosis of unspecified artery: Secondary | ICD-10-CM

## 2012-01-31 LAB — POCT INR: INR: 2.2

## 2012-02-04 ENCOUNTER — Telehealth: Payer: Self-pay | Admitting: Internal Medicine

## 2012-02-04 MED ORDER — BUSPIRONE HCL 15 MG PO TABS: ORAL_TABLET | ORAL | Status: DC

## 2012-02-04 MED ORDER — PRAVASTATIN SODIUM 40 MG PO TABS: ORAL_TABLET | ORAL | Status: DC

## 2012-02-04 MED ORDER — RANITIDINE HCL 150 MG PO TABS: ORAL_TABLET | ORAL | Status: DC

## 2012-02-04 NOTE — Telephone Encounter (Signed)
Refill: Buspirone hcl 15 mg tab. Take 1/2 tablet twice daily. Qty 90. Last fill 11-07-11  Pravastatin sodium 40 mg tab. Take one-half tablet daily. Qty 15. Last fill 01-07-12

## 2012-02-04 NOTE — Telephone Encounter (Signed)
Refill: ranitidine hcl 150 mg tab. Take one tablet twice daily. Qty 180. Last fill 11-07-11

## 2012-02-05 ENCOUNTER — Other Ambulatory Visit: Payer: Self-pay | Admitting: Internal Medicine

## 2012-02-05 MED ORDER — WARFARIN SODIUM 2.5 MG PO TABS: ORAL_TABLET | ORAL | Status: DC

## 2012-03-03 ENCOUNTER — Encounter: Payer: Self-pay | Admitting: Internal Medicine

## 2012-03-03 ENCOUNTER — Ambulatory Visit (INDEPENDENT_AMBULATORY_CARE_PROVIDER_SITE_OTHER): Payer: Medicare Other | Admitting: Internal Medicine

## 2012-03-03 VITALS — BP 125/71 | HR 65 | Ht 73.0 in | Wt 152.0 lb

## 2012-03-03 DIAGNOSIS — D619 Aplastic anemia, unspecified: Secondary | ICD-10-CM

## 2012-03-03 LAB — CBC WITH DIFFERENTIAL/PLATELET
Basophils Absolute: 0 10*3/uL (ref 0.0–0.1)
Basophils Relative: 0.5 % (ref 0.0–3.0)
Eosinophils Absolute: 0.1 10*3/uL (ref 0.0–0.7)
Eosinophils Relative: 1.6 % (ref 0.0–5.0)
HCT: 35.8 % — ABNORMAL LOW (ref 39.0–52.0)
Hemoglobin: 12.2 g/dL — ABNORMAL LOW (ref 13.0–17.0)
Lymphocytes Relative: 23.3 % (ref 12.0–46.0)
Lymphs Abs: 1.8 10*3/uL (ref 0.7–4.0)
MCHC: 34 g/dL (ref 30.0–36.0)
MCV: 86.9 fl (ref 78.0–100.0)
Monocytes Absolute: 0.9 10*3/uL (ref 0.1–1.0)
Monocytes Relative: 12.1 % — ABNORMAL HIGH (ref 3.0–12.0)
Neutro Abs: 4.8 10*3/uL (ref 1.4–7.7)
Neutrophils Relative %: 62.5 % (ref 43.0–77.0)
Platelets: 193 10*3/uL (ref 150.0–400.0)
RBC: 4.13 Mil/uL — ABNORMAL LOW (ref 4.22–5.81)
RDW: 13.1 % (ref 11.5–14.6)
WBC: 7.7 10*3/uL (ref 4.5–10.5)

## 2012-03-03 LAB — FOLATE: Folate: >24.8 ng/mL (ref >5.9)

## 2012-03-03 LAB — IBC PANEL
Iron: 94 ug/dL (ref 42–165)
Saturation Ratios: 25.6 % (ref 20.0–50.0)
Transferrin: 262.1 mg/dL (ref 212.0–360.0)

## 2012-03-03 LAB — VITAMIN B12: Vitamin B-12: 367 pg/mL (ref 211–911)

## 2012-03-03 LAB — FERRITIN: Ferritin: 60.7 ng/mL (ref 22.0–322.0)

## 2012-03-03 NOTE — Progress Notes (Signed)
HPI Patient is a 79 yo with a history of L popliteal artery embolus and mild mitral stenosis.  I saw him in clinic in Jan 2013  He also has a history of HTN and HL  No documented afib.   Echo in June 2013 confirmed mild mitral stenososi  He also has mild aortic stenosis.  LV function was normal. SInce I saw him he has done well  Denies CP  No SOB  No PND  No palpitations He is very active, walking regularly No Known Allergies  Current Outpatient Prescriptions  Medication Sig Dispense Refill  . AMBULATORY NON FORMULARY MEDICATION Lidocaine Hydrochlorid Spray, daily as needed       . amLODipine (NORVASC) 10 MG tablet TAKE ONE-HALF TABLET DAILY  45 tablet  1  . amoxicillin-clavulanate (AUGMENTIN) 500-125 MG per tablet Take with a meal  14 tablet  0  . busPIRone (BUSPAR) 15 MG tablet TAKE 1/2 TABLET TWICE DAILY  90 tablet  0  . Calcium Carbonate-Vit D-Min (CALTRATE 600+D PLUS PO) Take by mouth 2 (two) times daily.        . fluticasone (FLONASE) 50 MCG/ACT nasal spray Place 1 spray into the nose 2 (two) times daily as needed for rhinitis.  16 g  2  . ketotifen (ZADITOR) 0.025 % ophthalmic solution Place 1 drop into both eyes daily as needed.        . Multiple Vitamin (MULTIVITAMIN) tablet Take 1 tablet by mouth daily.        . Omega-3 Fatty Acids (FISH OIL CONCENTRATE PO) Take 1,400 mg by mouth.        . pravastatin (PRAVACHOL) 40 MG tablet 1 by mouth daily, Call for appointment  90 tablet  0  . ranitidine (ZANTAC) 150 MG tablet TAKE ONE TABLET TWICE DAILY  180 tablet  0  . SF 5000 PLUS 1.1 % CREA dental cream As directed      . sildenafil (VIAGRA) 100 MG tablet 1/2-1 qd prn  5 tablet  5  . Specialty Vitamins Products (MAGNESIUM, AMINO ACID CHELATE,) 133 MG tablet Take 1 tablet by mouth daily.        Marland Kitchen topiramate (TOPAMAX) 25 MG capsule Take 25 mg by mouth. 3 by mouth in am       . vitamin B-12 (CYANOCOBALAMIN) 500 MCG tablet Take 500 mcg by mouth daily.        Marland Kitchen warfarin (COUMADIN) 2.5 MG tablet  Take as directed by anticoagulation clinic  40 tablet  3  . zoledronic acid (RECLAST) 5 MG/100ML SOLN Inject 5 mg into the vein once.        Past Medical History  Diagnosis Date  . Anemia   . Colon polyp   . COPD (chronic obstructive pulmonary disease)     Astmatic bronchitis  . Asthma with bronchitis   . Gout     PMH  . Osteoporosis   . Allergic rhinitis   . Skin cancer, basal cell     Dr Margo Aye, PMH  . Nephrolithiasis   . HLD (hyperlipidemia)     Past Surgical History  Procedure Date  . Finger surgery     for foreign body  . Colonoscopy w/ polypectomy     Dr Arlyce Dice  . Inguinal hernia repair   . Nose surgery     Septal Deviation  . Neck surgery 12/2010    Melanoma ; [facility]   . Leg surgery 06/2010     Dr.Lawson, for blood clott. Left leg  Family History  Problem Relation Age of Onset  . Allergies Mother   . Coronary artery disease Mother   . Allergy (severe) Mother   . Allergies Father   . Stroke Father   . Heart attack Father 77  . Asthma Father   . Allergy (severe) Father   . Allergies Sister   . Allergy (severe) Sister   . Allergies Brother     x2  . Allergy (severe) Brother   . Allergy (severe) Brother   . COPD Neg Hx     History   Social History  . Marital Status: Married    Spouse Name: N/A    Number of Children: N/A  . Years of Education: N/A   Occupational History  . retired Therapist, music [employer]   Social History Main Topics  . Smoking status: Former Smoker -- 3.0 packs/day    Types: Cigarettes    Quit date: 02/19/1961  . Smokeless tobacco: Not on file     Comment: started at age 64  . Alcohol Use: No  . Drug Use: No  . Sexually Active: Not on file   Other Topics Concern  . Not on file   Social History Narrative  . No narrative on file    Review of Systems:  All systems reviewed.  They are negative to the above problem except as previously stated.  Vital Signs: BP 125/71  Pulse 65  Ht 6\' 1"  (1.854 m)  Wt 152 lb  (68.947 kg)  BMI 20.05 kg/m2  Physical Exam Patient is in NAD HEENT:  Normocephalic, atraumatic. EOMI, PERRLA.  Neck: JVP is normal. Murmur radiating to neck Lungs: clear to auscultation. No rales no wheezes.  Heart: Regular rate and rhythm. Normal S1, S2. No S3  Gr III/Vi systolic murmur LSB to Base and apex  No diastolic murmurs audiuble PMI not displaced.  Abdomen:  Supple, nontender. Normal bowel sounds. No masses. No hepatomegaly.  Extremities:   Good distal pulses throughout. No lower extremity edema.  Musculoskeletal :moving all extremities.  Neuro:   alert and oriented x3.  CN II-XII grossly intact.   Assessment and Plan:  1.  Popliteal embolus  Patient should be on lifelong anticoag.  Wiill check CBC and anemia panel  WIfe to check on copays of other agents  2.  Valve problems.  Echo in June with mild AS and MS  Follow clinically  Periodic echoes.  3.  HL  Good control in November.

## 2012-03-03 NOTE — Patient Instructions (Signed)
Lab work today We will call you with results.  Your physician wants you to follow-up in: January 2015 You will receive a reminder letter in the mail two months in advance. If you don't receive a letter, please call our office to schedule the follow-up appointment.

## 2012-03-04 ENCOUNTER — Telehealth: Payer: Self-pay | Admitting: Internal Medicine

## 2012-03-04 NOTE — Telephone Encounter (Signed)
Robert Richard states that Mr Serano decided to stay on the coumadin because it was a lot cheaper if this is ok with Dr Tenny Craw

## 2012-03-04 NOTE — Telephone Encounter (Signed)
I think that is fine.  Agree with continuing Rx as he has been

## 2012-03-04 NOTE — Telephone Encounter (Signed)
New problem:    Patient was seen on yesterday. Decide to stay on current medication.

## 2012-03-05 NOTE — Telephone Encounter (Signed)
Advised Robert Richard, verbalized understanding

## 2012-03-06 ENCOUNTER — Ambulatory Visit (INDEPENDENT_AMBULATORY_CARE_PROVIDER_SITE_OTHER): Payer: Medicare Other | Admitting: *Deleted

## 2012-03-06 ENCOUNTER — Other Ambulatory Visit: Payer: Self-pay | Admitting: *Deleted

## 2012-03-06 DIAGNOSIS — Z7901 Long term (current) use of anticoagulants: Secondary | ICD-10-CM

## 2012-03-06 DIAGNOSIS — D6489 Other specified anemias: Secondary | ICD-10-CM

## 2012-03-06 DIAGNOSIS — I749 Embolism and thrombosis of unspecified artery: Secondary | ICD-10-CM

## 2012-03-06 LAB — POCT INR: INR: 2.6

## 2012-04-17 ENCOUNTER — Ambulatory Visit (INDEPENDENT_AMBULATORY_CARE_PROVIDER_SITE_OTHER): Payer: Medicare Other | Admitting: *Deleted

## 2012-04-17 DIAGNOSIS — I749 Embolism and thrombosis of unspecified artery: Secondary | ICD-10-CM

## 2012-04-17 DIAGNOSIS — Z7901 Long term (current) use of anticoagulants: Secondary | ICD-10-CM

## 2012-04-17 LAB — POCT INR: INR: 2.6

## 2012-05-07 ENCOUNTER — Telehealth: Payer: Self-pay | Admitting: Internal Medicine

## 2012-05-07 MED ORDER — BUSPIRONE HCL 15 MG PO TABS: ORAL_TABLET | ORAL | Status: DC

## 2012-05-07 MED ORDER — RANITIDINE HCL 150 MG PO TABS: ORAL_TABLET | ORAL | Status: DC

## 2012-05-07 NOTE — Telephone Encounter (Signed)
Refill: Ranitidine 150 mg tablet. Take one tablet twice daily. Qty 180. Last fill 02-04-12

## 2012-05-07 NOTE — Telephone Encounter (Signed)
Refill: busiprone 15 mg tablet. Take 1/2 tablet twice daily. Qty 90. Last fill 02-04-12

## 2012-05-07 NOTE — Telephone Encounter (Signed)
Rxs sent

## 2012-05-29 ENCOUNTER — Ambulatory Visit (INDEPENDENT_AMBULATORY_CARE_PROVIDER_SITE_OTHER): Payer: Medicare Other | Admitting: *Deleted

## 2012-05-29 DIAGNOSIS — I749 Embolism and thrombosis of unspecified artery: Secondary | ICD-10-CM

## 2012-05-29 DIAGNOSIS — Z7901 Long term (current) use of anticoagulants: Secondary | ICD-10-CM

## 2012-05-29 LAB — POCT INR: INR: 2.1

## 2012-06-20 ENCOUNTER — Telehealth: Payer: Self-pay | Admitting: *Deleted

## 2012-06-20 MED ORDER — WARFARIN SODIUM 2.5 MG PO TABS: ORAL_TABLET | ORAL | Status: DC

## 2012-06-20 NOTE — Telephone Encounter (Signed)
Noted incoming fax to request pt coumadin, sent via escribe

## 2012-07-03 ENCOUNTER — Telehealth: Payer: Self-pay

## 2012-07-03 DIAGNOSIS — M79609 Pain in unspecified limb: Secondary | ICD-10-CM

## 2012-07-03 NOTE — Telephone Encounter (Signed)
Wife called to report pt's c/o pain in right leg with both activity and rest; states "this has been going on for awhile".  States pt. denies any numbness or tingling.  States she notices that pt. "drags his right leg all the time."  States that "he doesn't think this is true, but it is noticeable by other family members".  Denies any signs of weakness of extremities.  Pt. unsure if symptoms are similar to what he experienced with left leg embolus in 2012.  Denies any discoloration of right lower extremity; states his "feet stay cold all the time".  Denies any ulcers/sores of right LE. Continues on Coumadin.   Will discuss with MD on 5/16, and schedule appt. as per MD recommendations.  Advised to go to ER if pain becomes worse, or right lower extremity/foot gets discolored/blue.  Wife verb. understanding

## 2012-07-04 ENCOUNTER — Telehealth: Payer: Self-pay | Admitting: Vascular Surgery

## 2012-07-04 NOTE — Telephone Encounter (Signed)
notified patient of appt. on 07-08-12 at  9am with dr. Hart Rochester

## 2012-07-04 NOTE — Telephone Encounter (Signed)
Discussed reported symptoms to Dr. Imogene Burn.  Recommends to schedule pt. For ABI's and office appt.  Will contact pt. To schedule.

## 2012-07-07 ENCOUNTER — Other Ambulatory Visit: Payer: Self-pay | Admitting: General Practice

## 2012-07-07 ENCOUNTER — Encounter: Payer: Self-pay | Admitting: Vascular Surgery

## 2012-07-07 MED ORDER — AMLODIPINE BESYLATE 10 MG PO TABS: ORAL_TABLET | ORAL | Status: DC

## 2012-07-08 ENCOUNTER — Encounter (INDEPENDENT_AMBULATORY_CARE_PROVIDER_SITE_OTHER): Payer: Medicare Other | Admitting: *Deleted

## 2012-07-08 ENCOUNTER — Ambulatory Visit (INDEPENDENT_AMBULATORY_CARE_PROVIDER_SITE_OTHER): Payer: Medicare Other | Admitting: Vascular Surgery

## 2012-07-08 ENCOUNTER — Encounter: Payer: Self-pay | Admitting: Vascular Surgery

## 2012-07-08 VITALS — BP 141/91 | HR 74 | Resp 18 | Ht 72.0 in | Wt 226.0 lb

## 2012-07-08 DIAGNOSIS — I739 Peripheral vascular disease, unspecified: Secondary | ICD-10-CM | POA: Insufficient documentation

## 2012-07-08 DIAGNOSIS — M79609 Pain in unspecified limb: Secondary | ICD-10-CM | POA: Insufficient documentation

## 2012-07-08 NOTE — Progress Notes (Signed)
Subjective:     Patient ID: Robert Richard, male   DOB: 1930-11-03, 79 y.o.   MRN: [id number]  HPI this 79 year old male is status post left popliteal and tibial embolectomy in May of 2012. This was done 5 days following the onset of the embolus. He's been on chronic Coumadin since then and has had normal ABIs. He has been having some pain on the medial aspect of his left calf as well as numbness and is concerned about his circulation. This has been present ever since he had the procedure and the embolus occurred. He is able to ambulate several blocks without stopping. He does occasionally drags his right leg but has no history of nerve root compression or lumbar disc disease.  Past Medical History  Diagnosis Date  . Anemia   . Colon polyp   . COPD (chronic obstructive pulmonary disease)     Astmatic bronchitis  . Asthma with bronchitis   . Gout     PMH  . Osteoporosis   . Allergic rhinitis   . Skin cancer, basal cell     Dr Margo Aye, PMH  . Nephrolithiasis   . HLD (hyperlipidemia)   . Skin cancer (melanoma) 2012    Right neck    History  Substance Use Topics  . Smoking status: Former Smoker -- 3.00 packs/day    Types: Cigarettes    Quit date: 02/19/1961  . Smokeless tobacco: Never Used     Comment: started at age 58  . Alcohol Use: No    Family History  Problem Relation Age of Onset  . Allergies Mother   . Coronary artery disease Mother   . Allergy (severe) Mother   . Allergies Father   . Stroke Father   . Heart attack Father 53  . Asthma Father   . Allergy (severe) Father   . Allergies Sister   . Allergy (severe) Sister   . Allergies Brother     x2  . Allergy (severe) Brother   . Diabetes Brother   . Allergy (severe) Brother   . COPD Neg Hx   . Cancer Son     No Known Allergies  Current outpatient prescriptions:AMBULATORY NON FORMULARY MEDICATION, Lidocaine Hydrochlorid Spray, daily as needed , Disp: , Rfl: ;  amLODipine (NORVASC) 10 MG tablet, TAKE ONE-HALF  TABLET DAILY, Disp: 45 tablet, Rfl: 1;  amoxicillin-clavulanate (AUGMENTIN) 500-125 MG per tablet, Take with a meal, Disp: 14 tablet, Rfl: 0;  busPIRone (BUSPAR) 15 MG tablet, TAKE 1/2 TABLET TWICE DAILY, Disp: 90 tablet, Rfl: 1 Calcium Carbonate-Vit D-Min (CALTRATE 600+D PLUS PO), Take by mouth 2 (two) times daily.  , Disp: , Rfl: ;  fluticasone (FLONASE) 50 MCG/ACT nasal spray, Place 1 spray into the nose 2 (two) times daily as needed for rhinitis., Disp: 16 g, Rfl: 2;  ibuprofen (ADVIL,MOTRIN) 200 MG tablet, Take 200 mg by mouth as needed for pain., Disp: , Rfl:  ketotifen (ZADITOR) 0.025 % ophthalmic solution, Place 1 drop into both eyes daily as needed.  , Disp: , Rfl: ;  Multiple Vitamin (MULTIVITAMIN) tablet, Take 1 tablet by mouth daily.  , Disp: , Rfl: ;  naproxen sodium (ANAPROX) 220 MG tablet, Take 220 mg by mouth as needed., Disp: , Rfl: ;  Omega-3 Fatty Acids (FISH OIL CONCENTRATE PO), Take 1,400 mg by mouth.  , Disp: , Rfl:  pravastatin (PRAVACHOL) 40 MG tablet, 1 by mouth daily, Call for appointment, Disp: 90 tablet, Rfl: 0;  ranitidine (ZANTAC) 150 MG tablet, TAKE  ONE TABLET TWICE DAILY, Disp: 180 tablet, Rfl: 1;  SF 5000 PLUS 1.1 % CREA dental cream, As directed, Disp: , Rfl: ;  sildenafil (VIAGRA) 100 MG tablet, 1/2-1 qd prn, Disp: 5 tablet, Rfl: 5 Specialty Vitamins Products (MAGNESIUM, AMINO ACID CHELATE,) 133 MG tablet, Take 1 tablet by mouth daily.  , Disp: , Rfl: ;  topiramate (TOPAMAX) 25 MG capsule, Take 25 mg by mouth. 3 by mouth in am , Disp: , Rfl: ;  vitamin B-12 (CYANOCOBALAMIN) 500 MCG tablet, Take 500 mcg by mouth daily.  , Disp: , Rfl: ;  warfarin (COUMADIN) 2.5 MG tablet, Take as directed by anticoagulation clinic, Disp: 45 tablet, Rfl: 3 zoledronic acid (RECLAST) 5 MG/100ML SOLN, Inject 5 mg into the vein once., Disp: , Rfl:   BP 139/75  Pulse 54  Resp 16  Ht 6\' 2"  (1.88 m)  Wt 150 lb (68.04 kg)  BMI 19.25 kg/m2  SpO2 100%  Body mass index is 19.25  kg/(m^2).           Review of Systems denies chest pain, dyspnea on exertion, PND, orthopnea. He has had productive cough, asthma, pain in his feet while lying flat, and occasionally drags of the right foot with walking     Objective:   Physical Exam blood pressure 139/75 heart rate 84 respirations 16 Gen.-alert and oriented x3 in no apparent distress HEENT normal for age Lungs no rhonchi or wheezing Cardiovascular regular rhythm no murmurs carotid pulses 3+ palpable no bruits audible Abdomen soft nontender no palpable masses Musculoskeletal free of  major deformities Skin clear -no rashes Neurologic normal no definite weakness in the right leg noted Lower extremities 3+ femoral and posterior tibial pulses palpable bilaterally with no edema   Today and ordered arterial study of both lower extremities. ABIs are normal in both anterior tibial arteries with triphasic flow.      Assessment:     2 years status post embolectomy left popliteal artery and tibial vessels-currently has normal arterial circulation Patient complains of "dragging" right leg for 4-5 months Patient complains of numbness and pain medial aspect of left leg which has been present since embolus occurred Maybe neuropathy-no treatment for this postoperative pain is available     Plan:     No vascular followup indicated Will leave decision regarding possible neurologic evaluation for "dragging" right leg up to Dr. Alwyn Ren

## 2012-07-09 ENCOUNTER — Ambulatory Visit (INDEPENDENT_AMBULATORY_CARE_PROVIDER_SITE_OTHER): Payer: Medicare Other | Admitting: *Deleted

## 2012-07-09 DIAGNOSIS — D649 Anemia, unspecified: Secondary | ICD-10-CM

## 2012-07-09 DIAGNOSIS — D6489 Other specified anemias: Secondary | ICD-10-CM

## 2012-07-09 LAB — CBC WITH DIFFERENTIAL/PLATELET
Basophils Absolute: 0 K/uL (ref 0.0–0.1)
Basophils Relative: 0.7 % (ref 0.0–3.0)
Eosinophils Absolute: 0.2 K/uL (ref 0.0–0.7)
Eosinophils Relative: 3.1 % (ref 0.0–5.0)
HCT: 32.5 % — ABNORMAL LOW (ref 39.0–52.0)
Hemoglobin: 11.2 g/dL — ABNORMAL LOW (ref 13.0–17.0)
Lymphocytes Relative: 27.4 % (ref 12.0–46.0)
Lymphs Abs: 1.7 K/uL (ref 0.7–4.0)
MCHC: 34.3 g/dL (ref 30.0–36.0)
MCV: 86.6 fl (ref 78.0–100.0)
Monocytes Absolute: 0.9 K/uL (ref 0.1–1.0)
Monocytes Relative: 14.2 % — ABNORMAL HIGH (ref 3.0–12.0)
Neutro Abs: 3.4 K/uL (ref 1.4–7.7)
Neutrophils Relative %: 54.6 % (ref 43.0–77.0)
Platelets: 189 K/uL (ref 150.0–400.0)
RBC: 3.75 Mil/uL — ABNORMAL LOW (ref 4.22–5.81)
RDW: 13.5 % (ref 11.5–14.6)
WBC: 6.2 K/uL (ref 4.5–10.5)

## 2012-07-10 ENCOUNTER — Ambulatory Visit (INDEPENDENT_AMBULATORY_CARE_PROVIDER_SITE_OTHER): Payer: Medicare Other | Admitting: *Deleted

## 2012-07-10 DIAGNOSIS — I749 Embolism and thrombosis of unspecified artery: Secondary | ICD-10-CM

## 2012-07-10 DIAGNOSIS — Z7901 Long term (current) use of anticoagulants: Secondary | ICD-10-CM

## 2012-07-10 LAB — POCT INR: INR: 3.6

## 2012-07-16 ENCOUNTER — Telehealth: Payer: Self-pay | Admitting: Internal Medicine

## 2012-07-16 NOTE — Telephone Encounter (Signed)
New problem   Per pts wife returning your call

## 2012-07-16 NOTE — Telephone Encounter (Signed)
Patient is interested in changing from coumadin to Eliquis, Pradaxa, or Xarelto --which ever Dr. Tenny Craw thinks is best.  Pt has check with insurance company is is comfortable with the pricing for any off the three mentioned.  Will forward to Dr. Tenny Craw for review.

## 2012-07-24 NOTE — Telephone Encounter (Signed)
Pt has researched meds with insurance company and wants to stop coumadin and start Eliquis.  Will forward to Dr. Tenny Craw for review.

## 2012-07-24 NOTE — Telephone Encounter (Signed)
F/u   Pts wife calling regarding this

## 2012-07-25 ENCOUNTER — Telehealth: Payer: Self-pay | Admitting: *Deleted

## 2012-07-25 DIAGNOSIS — I10 Essential (primary) hypertension: Secondary | ICD-10-CM

## 2012-07-25 NOTE — Telephone Encounter (Signed)
Researched with pharmacy  They would recomm Xarelto as the indication is there for peripheral clots (DVTS) Eliquis is not at present Would need BMET to recheck renal function.

## 2012-07-25 NOTE — Telephone Encounter (Signed)
Received incoming call per pt to have Pt check in our office on 07-31-12, Dr PR requests pt to have BMET drawn the day of his OV here in [place], printed lab orders, will advise pt once in office

## 2012-07-25 NOTE — Telephone Encounter (Signed)
Pt notified.  Will have bmet drawn in [place] on 07/31/2012 after PT/INR.

## 2012-07-31 ENCOUNTER — Ambulatory Visit (INDEPENDENT_AMBULATORY_CARE_PROVIDER_SITE_OTHER): Payer: Medicare Other | Admitting: *Deleted

## 2012-07-31 ENCOUNTER — Other Ambulatory Visit: Payer: Self-pay | Admitting: *Deleted

## 2012-07-31 DIAGNOSIS — Z7901 Long term (current) use of anticoagulants: Secondary | ICD-10-CM

## 2012-07-31 DIAGNOSIS — I749 Embolism and thrombosis of unspecified artery: Secondary | ICD-10-CM

## 2012-07-31 LAB — POCT INR: INR: 3.6

## 2012-07-31 MED ORDER — PRAVASTATIN SODIUM 40 MG PO TABS: ORAL_TABLET | ORAL | Status: DC

## 2012-07-31 NOTE — Telephone Encounter (Signed)
Rx sent 

## 2012-07-31 NOTE — Progress Notes (Signed)
Pt was started on Xarelto 20 daily for arterial embolus on 07/31/2012  Labs done today.  Reviewed patients medication list.  Pt is not currently on any combined P-gp and strong CYP3A4 inhibitors/inducers (ketoconazole, traconazole, ritonavir, carbamazepine, phenytoin, rifampin, St. John's wort).  Reviewed labs.  SCr 1.31, Weight  102.51 kg, CrCl-65.20  Dose appropriate based on CrCl.   Hgb 11.2 and HCT 32.5 done in May 2014  A full discussion of the nature of anticoagulants has been carried out.  A benefit/risk analysis has been presented to the patient, so that they understand the justification for choosing anticoagulation with Xarelto at this time.  The need for compliance is stressed.  Pt is aware to take the medication once daily with the largest meal of the day.  Side effects of potential bleeding are discussed, including unusual colored urine or stools, coughing up blood or coffee ground emesis, nose bleeds or serious fall or head trauma.  Discussed signs and symptoms of stroke. The patient should avoid any OTC items containing aspirin or ibuprofen.  Avoid alcohol consumption.   Call if any signs of abnormal bleeding.  Discussed financial obligations and resolved any difficulty in obtaining medication.  Next lab test test in 1 month.

## 2012-08-01 LAB — BASIC METABOLIC PANEL
BUN: 21 mg/dL (ref 6–23)
CO2: 23 mEq/L (ref 19–32)
Calcium: 8.9 mg/dL (ref 8.4–10.5)
Chloride: 110 mEq/L (ref 96–112)
Creat: 1.31 mg/dL (ref 0.50–1.35)
Glucose, Bld: 142 mg/dL — ABNORMAL HIGH (ref 70–99)
Potassium: 4 mEq/L (ref 3.5–5.3)
Sodium: 138 mEq/L (ref 135–145)

## 2012-08-01 NOTE — Telephone Encounter (Signed)
Pt given lab slips, noted as drawn, advised Dr Tenny Craw the results are in the pt chart to be reviewed, will call pt with results once noted

## 2012-08-19 DIAGNOSIS — I499 Cardiac arrhythmia, unspecified: Secondary | ICD-10-CM

## 2012-08-19 HISTORY — DX: Cardiac arrhythmia, unspecified: I49.9

## 2012-08-26 DIAGNOSIS — C439 Malignant melanoma of skin, unspecified: Secondary | ICD-10-CM | POA: Insufficient documentation

## 2012-08-28 ENCOUNTER — Ambulatory Visit (INDEPENDENT_AMBULATORY_CARE_PROVIDER_SITE_OTHER): Payer: Medicare Other | Admitting: *Deleted

## 2012-08-28 DIAGNOSIS — I749 Embolism and thrombosis of unspecified artery: Secondary | ICD-10-CM

## 2012-08-28 DIAGNOSIS — Z7901 Long term (current) use of anticoagulants: Secondary | ICD-10-CM

## 2012-08-28 LAB — CBC WITH DIFFERENTIAL/PLATELET
Basophils Absolute: 0 10*3/uL (ref 0.0–0.1)
Basophils Relative: 0.7 % (ref 0.0–3.0)
Eosinophils Absolute: 0.1 10*3/uL (ref 0.0–0.7)
Eosinophils Relative: 2.3 % (ref 0.0–5.0)
HCT: 35.7 % — ABNORMAL LOW (ref 39.0–52.0)
Hemoglobin: 12 g/dL — ABNORMAL LOW (ref 13.0–17.0)
Lymphocytes Relative: 26.4 % (ref 12.0–46.0)
Lymphs Abs: 1.6 10*3/uL (ref 0.7–4.0)
MCHC: 33.6 g/dL (ref 30.0–36.0)
MCV: 89.2 fl (ref 78.0–100.0)
Monocytes Absolute: 0.9 10*3/uL (ref 0.1–1.0)
Monocytes Relative: 14.1 % — ABNORMAL HIGH (ref 3.0–12.0)
Neutro Abs: 3.5 10*3/uL (ref 1.4–7.7)
Neutrophils Relative %: 56.5 % (ref 43.0–77.0)
Platelets: 191 10*3/uL (ref 150.0–400.0)
RBC: 4 Mil/uL — ABNORMAL LOW (ref 4.22–5.81)
RDW: 13.9 % (ref 11.5–14.6)
WBC: 6.2 10*3/uL (ref 4.5–10.5)

## 2012-08-28 LAB — BASIC METABOLIC PANEL
BUN: 21 mg/dL (ref 6–23)
CO2: 29 mEq/L (ref 19–32)
Calcium: 9.2 mg/dL (ref 8.4–10.5)
Chloride: 106 mEq/L (ref 96–112)
Creatinine, Ser: 1.3 mg/dL (ref 0.4–1.5)
GFR: 56.42 mL/min — ABNORMAL LOW (ref >60.00)
Glucose, Bld: 107 mg/dL — ABNORMAL HIGH (ref 70–99)
Potassium: 4.2 mEq/L (ref 3.5–5.1)
Sodium: 135 mEq/L (ref 135–145)

## 2012-08-28 MED ORDER — RIVAROXABAN 20 MG PO TABS: 20.0000 mg | ORAL_TABLET | Freq: Every day | ORAL | Status: DC

## 2012-08-28 NOTE — Progress Notes (Signed)
     Pt was started on Xarelto 20 daily for arterial embolus on 07/31/2012  Labs done today 08/28/2012 Reviewed patients medication list. Pt is not currently on any combined P-gp and strong CYP3A4 inhibitors/inducers (ketoconazole, traconazole, ritonavir, carbamazepine, phenytoin, rifampin, St. John's wort). Reviewed labs. SCr 1.3, Weight 102.51 kg, CrCl-65.71  Dose appropriate  based on CrCl. Hgb 12.0 and HCT 35.7 A full discussion of the nature of anticoagulants has been carried out. A benefit/risk analysis has been presented to the patient, so that they understand the justification for choosing anticoagulation with Xarelto at this time. The need for compliance is stressed. Pt is aware to take the medication once daily with the largest meal of the day. Side effects of potential bleeding are discussed, including unusual colored urine or stools, coughing up blood or coffee ground emesis, nose bleeds or serious fall or head trauma. Discussed signs and symptoms of stroke. The patient should avoid any OTC items containing aspirin or ibuprofen. Avoid alcohol consumption. Call if any signs of abnormal bleeding. Discussed financial obligations and resolved any difficulty in obtaining medication. Next lab test in 6 month appt made.

## 2012-09-02 NOTE — Addendum Note (Signed)
Addended by: Thompson Grayer on: 09/02/2012 04:24 PM   Modules accepted: Orders

## 2012-09-15 ENCOUNTER — Encounter (HOSPITAL_COMMUNITY): Payer: Self-pay | Admitting: Cardiology

## 2012-09-15 ENCOUNTER — Inpatient Hospital Stay (HOSPITAL_COMMUNITY)
Admission: EM | Admit: 2012-09-15 | Discharge: 2012-09-16 | DRG: 310 | Disposition: A | Discharge status: Home / Self Care | Payer: Medicare Other | Attending: Internal Medicine | Admitting: Internal Medicine

## 2012-09-15 ENCOUNTER — Emergency Department (HOSPITAL_COMMUNITY): Payer: Medicare Other

## 2012-09-15 ENCOUNTER — Telehealth: Payer: Self-pay | Admitting: Internal Medicine

## 2012-09-15 DIAGNOSIS — D649 Anemia, unspecified: Secondary | ICD-10-CM | POA: Diagnosis present

## 2012-09-15 DIAGNOSIS — I050 Rheumatic mitral stenosis: Secondary | ICD-10-CM | POA: Diagnosis present

## 2012-09-15 DIAGNOSIS — J309 Allergic rhinitis, unspecified: Secondary | ICD-10-CM

## 2012-09-15 DIAGNOSIS — G2581 Restless legs syndrome: Secondary | ICD-10-CM

## 2012-09-15 DIAGNOSIS — Z7901 Long term (current) use of anticoagulants: Secondary | ICD-10-CM

## 2012-09-15 DIAGNOSIS — M109 Gout, unspecified: Secondary | ICD-10-CM | POA: Diagnosis present

## 2012-09-15 DIAGNOSIS — G43909 Migraine, unspecified, not intractable, without status migrainosus: Secondary | ICD-10-CM | POA: Diagnosis present

## 2012-09-15 DIAGNOSIS — Z85828 Personal history of other malignant neoplasm of skin: Secondary | ICD-10-CM

## 2012-09-15 DIAGNOSIS — Z86718 Personal history of other venous thrombosis and embolism: Secondary | ICD-10-CM

## 2012-09-15 DIAGNOSIS — K5289 Other specified noninfective gastroenteritis and colitis: Secondary | ICD-10-CM

## 2012-09-15 DIAGNOSIS — I739 Peripheral vascular disease, unspecified: Secondary | ICD-10-CM

## 2012-09-15 DIAGNOSIS — J449 Chronic obstructive pulmonary disease, unspecified: Secondary | ICD-10-CM

## 2012-09-15 DIAGNOSIS — M810 Age-related osteoporosis without current pathological fracture: Secondary | ICD-10-CM

## 2012-09-15 DIAGNOSIS — J4489 Other specified chronic obstructive pulmonary disease: Secondary | ICD-10-CM | POA: Diagnosis present

## 2012-09-15 DIAGNOSIS — R5383 Other fatigue: Secondary | ICD-10-CM

## 2012-09-15 DIAGNOSIS — I519 Heart disease, unspecified: Secondary | ICD-10-CM | POA: Diagnosis present

## 2012-09-15 DIAGNOSIS — R7309 Other abnormal glucose: Secondary | ICD-10-CM

## 2012-09-15 DIAGNOSIS — I6529 Occlusion and stenosis of unspecified carotid artery: Secondary | ICD-10-CM | POA: Diagnosis present

## 2012-09-15 DIAGNOSIS — J45909 Unspecified asthma, uncomplicated: Secondary | ICD-10-CM

## 2012-09-15 DIAGNOSIS — R5381 Other malaise: Secondary | ICD-10-CM

## 2012-09-15 DIAGNOSIS — G479 Sleep disorder, unspecified: Secondary | ICD-10-CM

## 2012-09-15 DIAGNOSIS — R55 Syncope and collapse: Secondary | ICD-10-CM | POA: Diagnosis present

## 2012-09-15 DIAGNOSIS — R0609 Other forms of dyspnea: Secondary | ICD-10-CM

## 2012-09-15 DIAGNOSIS — I4891 Unspecified atrial fibrillation: Principal | ICD-10-CM

## 2012-09-15 DIAGNOSIS — Z79899 Other long term (current) drug therapy: Secondary | ICD-10-CM

## 2012-09-15 DIAGNOSIS — R0681 Apnea, not elsewhere classified: Secondary | ICD-10-CM

## 2012-09-15 DIAGNOSIS — M79609 Pain in unspecified limb: Secondary | ICD-10-CM

## 2012-09-15 DIAGNOSIS — I749 Embolism and thrombosis of unspecified artery: Secondary | ICD-10-CM | POA: Diagnosis present

## 2012-09-15 DIAGNOSIS — E785 Hyperlipidemia, unspecified: Secondary | ICD-10-CM | POA: Diagnosis present

## 2012-09-15 DIAGNOSIS — Z86010 Personal history of colonic polyps: Secondary | ICD-10-CM

## 2012-09-15 DIAGNOSIS — Z87442 Personal history of urinary calculi: Secondary | ICD-10-CM

## 2012-09-15 DIAGNOSIS — I10 Essential (primary) hypertension: Secondary | ICD-10-CM | POA: Diagnosis present

## 2012-09-15 HISTORY — DX: Cardiac arrhythmia, unspecified: I49.9

## 2012-09-15 HISTORY — DX: Unspecified osteoarthritis, unspecified site: M19.90

## 2012-09-15 LAB — BASIC METABOLIC PANEL
BUN: 27 mg/dL — ABNORMAL HIGH (ref 6–23)
BUN: 28 mg/dL — ABNORMAL HIGH (ref 6–23)
CO2: 21 mEq/L (ref 19–32)
CO2: 21 mEq/L (ref 19–32)
Calcium: 10.1 mg/dL (ref 8.4–10.5)
Calcium: 9.5 mg/dL (ref 8.4–10.5)
Chloride: 105 mEq/L (ref 96–112)
Chloride: 105 mEq/L (ref 96–112)
Creatinine, Ser: 1.27 mg/dL (ref 0.50–1.35)
Creatinine, Ser: 1.46 mg/dL — ABNORMAL HIGH (ref 0.50–1.35)
GFR calc Af Amer: 60 mL/min — ABNORMAL LOW (ref >90)
GFR calc Af Amer: 51 mL/min — ABNORMAL LOW (ref >90)
GFR calc non Af Amer: 52 mL/min — ABNORMAL LOW (ref >90)
GFR calc non Af Amer: 44 mL/min — ABNORMAL LOW (ref >90)
Glucose, Bld: 144 mg/dL — ABNORMAL HIGH (ref 70–99)
Glucose, Bld: 156 mg/dL — ABNORMAL HIGH (ref 70–99)
Potassium: 4.1 mEq/L (ref 3.5–5.1)
Potassium: 4.2 mEq/L (ref 3.5–5.1)
Sodium: 138 mEq/L (ref 135–145)
Sodium: 138 mEq/L (ref 135–145)

## 2012-09-15 LAB — URINALYSIS, ROUTINE W REFLEX MICROSCOPIC
Bilirubin Urine: NEGATIVE
Glucose, UA: NEGATIVE mg/dL
Hgb urine dipstick: NEGATIVE
Ketones, ur: NEGATIVE mg/dL
Leukocytes, UA: NEGATIVE
Nitrite: NEGATIVE
Protein, ur: NEGATIVE mg/dL
Specific Gravity, Urine: 1.018 (ref 1.005–1.030)
Urobilinogen, UA: 0.2 mg/dL (ref 0.0–1.0)
pH: 7 (ref 5.0–8.0)

## 2012-09-15 LAB — CBC WITH DIFFERENTIAL/PLATELET
Basophils Absolute: 0 10*3/uL (ref 0.0–0.1)
Basophils Relative: 1 % (ref 0–1)
Eosinophils Absolute: 0.1 10*3/uL (ref 0.0–0.7)
Eosinophils Relative: 1 % (ref 0–5)
HCT: 37.5 % — ABNORMAL LOW (ref 39.0–52.0)
Hemoglobin: 13 g/dL (ref 13.0–17.0)
Lymphocytes Relative: 24 % (ref 12–46)
Lymphs Abs: 1.9 10*3/uL (ref 0.7–4.0)
MCH: 30 pg (ref 26.0–34.0)
MCHC: 34.7 g/dL (ref 30.0–36.0)
MCV: 86.6 fL (ref 78.0–100.0)
Monocytes Absolute: 1 10*3/uL (ref 0.1–1.0)
Monocytes Relative: 13 % — ABNORMAL HIGH (ref 3–12)
Neutro Abs: 4.8 10*3/uL (ref 1.7–7.7)
Neutrophils Relative %: 62 % (ref 43–77)
Platelets: 206 10*3/uL (ref 150–400)
RBC: 4.33 MIL/uL (ref 4.22–5.81)
RDW: 13.3 % (ref 11.5–15.5)
WBC: 7.9 10*3/uL (ref 4.0–10.5)

## 2012-09-15 LAB — TROPONIN I
Troponin I: <0.3 ng/mL (ref <0.30)
Troponin I: <0.3 ng/mL (ref <0.30)
Troponin I: <0.3 ng/mL (ref <0.30)

## 2012-09-15 LAB — URINE MICROSCOPIC-ADD ON

## 2012-09-15 LAB — TSH: TSH: 2.547 u[IU]/mL (ref 0.350–4.500)

## 2012-09-15 MED ORDER — OFF THE BEAT BOOK
Freq: Once | Status: AC
  Administered 2012-09-15: 21:00:00
  Filled 2012-09-15: qty 1

## 2012-09-15 MED ORDER — SIMVASTATIN 20 MG PO TABS
20.0000 mg | ORAL_TABLET | Freq: Every day | ORAL | Status: DC
  Administered 2012-09-15: 20 mg via ORAL
  Filled 2012-09-15: qty 1

## 2012-09-15 MED ORDER — OMEGA-3-ACID ETHYL ESTERS 1 G PO CAPS
1.0000 g | ORAL_CAPSULE | Freq: Every day | ORAL | Status: DC
  Administered 2012-09-16: 1 g via ORAL
  Filled 2012-09-15: qty 1

## 2012-09-15 MED ORDER — SODIUM CHLORIDE 0.9 % IV SOLN
INTRAVENOUS | Status: DC
  Administered 2012-09-15 – 2012-09-16 (×2): via INTRAVENOUS

## 2012-09-15 MED ORDER — TOPIRAMATE 25 MG PO CPSP: 25.0000 mg | ORAL_CAPSULE | Freq: Every day | ORAL | Status: DC

## 2012-09-15 MED ORDER — CYANOCOBALAMIN 500 MCG PO TABS
500.0000 ug | ORAL_TABLET | Freq: Every day | ORAL | Status: DC
  Administered 2012-09-16: 500 ug via ORAL
  Filled 2012-09-15: qty 1

## 2012-09-15 MED ORDER — DILTIAZEM HCL 60 MG PO TABS
60.0000 mg | ORAL_TABLET | Freq: Four times a day (QID) | ORAL | Status: DC
  Administered 2012-09-15 (×2): 60 mg via ORAL
  Filled 2012-09-15 (×7): qty 1

## 2012-09-15 MED ORDER — TOPIRAMATE 25 MG PO TABS
75.0000 mg | ORAL_TABLET | Freq: Every day | ORAL | Status: DC
  Administered 2012-09-16: 75 mg via ORAL
  Filled 2012-09-15: qty 3

## 2012-09-15 MED ORDER — OLOPATADINE HCL 0.1 % OP SOLN
1.0000 [drp] | Freq: Two times a day (BID) | OPHTHALMIC | Status: DC | PRN
  Filled 2012-09-15: qty 5

## 2012-09-15 MED ORDER — ACETAMINOPHEN 650 MG RE SUPP: 650.0000 mg | Freq: Four times a day (QID) | RECTAL | Status: DC | PRN

## 2012-09-15 MED ORDER — ONE-DAILY MULTI VITAMINS PO TABS: 1.0000 | ORAL_TABLET | Freq: Every day | ORAL | Status: DC

## 2012-09-15 MED ORDER — MORPHINE SULFATE 2 MG/ML IJ SOLN: 1.0000 mg | INTRAMUSCULAR | Status: DC | PRN

## 2012-09-15 MED ORDER — BUSPIRONE HCL 15 MG PO TABS
7.5000 mg | ORAL_TABLET | Freq: Two times a day (BID) | ORAL | Status: DC
  Administered 2012-09-15 – 2012-09-16 (×2): 7.5 mg via ORAL
  Filled 2012-09-15 (×4): qty 1

## 2012-09-15 MED ORDER — VITAMIN B-12 500 MCG PO TABS: 500.0000 ug | ORAL_TABLET | Freq: Every day | ORAL | Status: DC

## 2012-09-15 MED ORDER — AMLODIPINE BESYLATE 10 MG PO TABS: 10.0000 mg | ORAL_TABLET | Freq: Every day | ORAL | Status: DC

## 2012-09-15 MED ORDER — ADULT MULTIVITAMIN W/MINERALS CH
1.0000 | ORAL_TABLET | Freq: Every day | ORAL | Status: DC
  Administered 2012-09-16: 1 via ORAL
  Filled 2012-09-15: qty 1

## 2012-09-15 MED ORDER — FAMOTIDINE 20 MG PO TABS
20.0000 mg | ORAL_TABLET | Freq: Two times a day (BID) | ORAL | Status: DC
  Administered 2012-09-15 – 2012-09-16 (×2): 20 mg via ORAL
  Filled 2012-09-15 (×3): qty 1

## 2012-09-15 MED ORDER — RIVAROXABAN 20 MG PO TABS
20.0000 mg | ORAL_TABLET | Freq: Every day | ORAL | Status: DC
  Administered 2012-09-15: 20 mg via ORAL
  Filled 2012-09-15 (×2): qty 1

## 2012-09-15 MED ORDER — ATORVASTATIN CALCIUM 10 MG PO TABS
10.0000 mg | ORAL_TABLET | Freq: Every day | ORAL | Status: DC
  Filled 2012-09-15: qty 1

## 2012-09-15 MED ORDER — ACETAMINOPHEN 325 MG PO TABS: 650.0000 mg | ORAL_TABLET | Freq: Four times a day (QID) | ORAL | Status: DC | PRN

## 2012-09-15 MED ORDER — KETOTIFEN FUMARATE 0.025 % OP SOLN: 1.0000 [drp] | Freq: Every day | OPHTHALMIC | Status: DC | PRN

## 2012-09-15 MED ORDER — FLUTICASONE PROPIONATE 50 MCG/ACT NA SUSP
1.0000 | Freq: Every day | NASAL | Status: DC | PRN
  Filled 2012-09-15: qty 16

## 2012-09-15 MED ORDER — SODIUM CHLORIDE 0.9 % IJ SOLN
3.0000 mL | Freq: Two times a day (BID) | INTRAMUSCULAR | Status: DC
  Administered 2012-09-15: 3 mL via INTRAVENOUS

## 2012-09-15 NOTE — ED Provider Notes (Signed)
CSN: [id number]     Arrival date & time 09/15/12  0919 History     First MD Initiated Contact with Patient 09/15/12 [phone]     Chief Complaint  Patient presents with  . Weakness   (Consider location/radiation/quality/duration/timing/severity/associated sxs/prior Treatment) HPI Patient presents to the emergency department with syncope and dizziness that occurred earlier today.  Patient, states, that he awoke at 3:30 AM to go to the bathroom and felt dizzy/Lightheaded.  Patient, states he woke at 6:30 the same as he did at 3:30.  He states when he finishes in the past and he went to go back to bed and felt sweaty and passed out.  The wife states, that she found him on the floor and he lost consciousness for about 30 seconds.  The patient denied chest pain, shortness of breath, headache, blurred vision, weakness, numbness, incontinence, seizure activity, fever, cough, back pain, neck pain, nausea, or vomiting.  Patient, states his stomach feels uneasy.  Patient denies take any medications prior to arrival. Past Medical History  Diagnosis Date  . Anemia   . Colon polyp   . COPD (chronic obstructive pulmonary disease)     Astmatic bronchitis  . Asthma with bronchitis   . Gout     PMH  . Osteoporosis   . Allergic rhinitis   . Skin cancer, basal cell     Dr Margo Aye, PMH  . Nephrolithiasis   . HLD (hyperlipidemia)   . Skin cancer (melanoma) 2012    Right neck   Past Surgical History  Procedure Laterality Date  . Finger surgery      for foreign body  . Colonoscopy w/ polypectomy      Dr Arlyce Dice  . Inguinal hernia repair    . Nose surgery      Septal Deviation  . Neck surgery  12/2010    Melanoma ; [facility]   . Leg surgery  06/2010     Dr.Lawson, for blood clott. Left leg    Family History  Problem Relation Age of Onset  . Allergies Mother   . Coronary artery disease Mother   . Allergy (severe) Mother   . Allergies Father   . Stroke Father   . Heart attack Father 69  .  Asthma Father   . Allergy (severe) Father   . Allergies Sister   . Allergy (severe) Sister   . Allergies Brother     x2  . Allergy (severe) Brother   . Diabetes Brother   . Allergy (severe) Brother   . COPD Neg Hx   . Cancer Son    History  Substance Use Topics  . Smoking status: Former Smoker -- 3.00 packs/day    Types: Cigarettes    Quit date: 02/19/1961  . Smokeless tobacco: Never Used     Comment: started at age 50  . Alcohol Use: No    Review of Systems All other systems negative except as documented in the HPI. All pertinent positives and negatives as reviewed in the HPI. Allergies  Ivp dye  Home Medications   Current Outpatient Rx  Name  Route  Sig  Dispense  Refill  . AMBULATORY NON FORMULARY MEDICATION      Lidocaine Hydrochlorid Spray, daily as needed          . amLODipine (NORVASC) 10 MG tablet      TAKE ONE-HALF TABLET DAILY   45 tablet   1   . amoxicillin-clavulanate (AUGMENTIN) 500-125 MG per tablet  Take with a meal   14 tablet   0   . busPIRone (BUSPAR) 15 MG tablet      TAKE 1/2 TABLET TWICE DAILY   90 tablet   1   . Calcium Carbonate-Vit D-Min (CALTRATE 600+D PLUS PO)   Oral   Take by mouth 2 (two) times daily.           . fluticasone (FLONASE) 50 MCG/ACT nasal spray   Nasal   Place 1 spray into the nose 2 (two) times daily as needed for rhinitis.   16 g   2   . ibuprofen (ADVIL,MOTRIN) 200 MG tablet   Oral   Take 200 mg by mouth as needed for pain.         Marland Kitchen ketotifen (ZADITOR) 0.025 % ophthalmic solution   Both Eyes   Place 1 drop into both eyes daily as needed.           . Multiple Vitamin (MULTIVITAMIN) tablet   Oral   Take 1 tablet by mouth daily.           . Omega-3 Fatty Acids (FISH OIL CONCENTRATE PO)   Oral   Take 1,400 mg by mouth.           . pravastatin (PRAVACHOL) 40 MG tablet      1 by mouth daily, Call for appointment   90 tablet   0     LAB DUE   . ranitidine (ZANTAC) 150 MG  tablet      TAKE ONE TABLET TWICE DAILY   180 tablet   1   . Rivaroxaban (XARELTO) 20 MG TABS   Oral   Take 1 tablet (20 mg total) by mouth daily with supper.   30 tablet   6     Has taken a months worth of samples and tolerate x ...   . SF 5000 PLUS 1.1 % CREA dental cream      As directed         . sildenafil (VIAGRA) 100 MG tablet      1/2-1 qd prn   5 tablet   5   . Specialty Vitamins Products (MAGNESIUM, AMINO ACID CHELATE,) 133 MG tablet   Oral   Take 1 tablet by mouth daily.           Marland Kitchen topiramate (TOPAMAX) 25 MG capsule   Oral   Take 25 mg by mouth. 3 by mouth in am          . vitamin B-12 (CYANOCOBALAMIN) 500 MCG tablet   Oral   Take 500 mcg by mouth daily.            BP 111/61  Pulse 84  Temp(Src) 98 F (36.7 C) (Oral)  Resp 18  SpO2 100% Physical Exam  Nursing note and vitals reviewed. Constitutional: He is oriented to person, place, and time. He appears well-developed and well-nourished. No distress.  HENT:  Head: Normocephalic and atraumatic.  Mouth/Throat: Oropharynx is clear and moist.  Eyes: Pupils are equal, round, and reactive to light.  Neck: Normal range of motion. Neck supple.  Cardiovascular: Normal rate, regular rhythm and normal heart sounds.  Exam reveals no gallop and no friction rub.   No murmur heard. Pulmonary/Chest: Effort normal and breath sounds normal. No respiratory distress.  Abdominal: Soft. Bowel sounds are normal. He exhibits no distension. There is no tenderness.  Neurological: He is alert and oriented to person, place, and time. He exhibits normal muscle tone.  Coordination normal.  Skin: Skin is warm and dry.    ED Course   Procedures (including critical care time)  Labs Reviewed  CBC WITH DIFFERENTIAL  BASIC METABOLIC PANEL  URINALYSIS, ROUTINE W REFLEX MICROSCOPIC  TROPONIN I   Patient's heart rate jumped up into the 130s and 40s when he stands.  Patient appears to be in atrial fibrillation.   Patient will be admitted for new onset atrial fibrillation.  MDM    Carlyle Dolly, PA-C 09/15/12 1346

## 2012-09-15 NOTE — Progress Notes (Addendum)
Patient ID: Robert Richard, male   DOB: [birth year], 79 y.o.   MRN: [id number]   CARDIOLOGY CONSULT NOTE  Patient ID: Robert Richard MRN: [id number], DOB/AGE: 79   Admit date: 09/15/2012 Date of Consult: 09/15/2012   Primary Physician: Robert Melnick, MD Primary Cardiologist: Robert Pates MD  Pt. Profile  [name] 79 year old quite active married white male from [place] admitted with micturition syncope and new onset A. fib. Problem List  Past Medical History  Diagnosis Date  . Anemia   . Colon polyp   . COPD (chronic obstructive pulmonary disease)     Astmatic bronchitis  . Asthma with bronchitis   . Gout     PMH  . Osteoporosis   . Allergic rhinitis   . Skin cancer, basal cell     Robert Richard, PMH  . Nephrolithiasis   . HLD (hyperlipidemia)   . Skin cancer (melanoma) 2012    Right neck    Past Surgical History  Procedure Laterality Date  . Finger surgery      for foreign body  . Colonoscopy w/ polypectomy      Robert Richard  . Inguinal hernia repair    . Nose surgery      Septal Deviation  . Neck surgery  12/2010    Melanoma ; [facility]   . Leg surgery  06/2010     Robert Richard, for blood clott. Left leg      Allergies  Allergies  Allergen Reactions  . Ivp Dye (Iodinated Diagnostic Agents) Hives    HPI   Patient is followed by Robert. Tenny Richard in the office for mild mitral stenosis and mild aortic stenosis. Last echocardiogram was 07/31/2011.  While urinating early this morning, he became lightheaded and fell something come up from his stomach, a weird sensation. He called for his wife to arrive in time to catch him when he fainted. No injury occurred.  Orthostatics were negative in the emergency room. He was found to be in A. fib with rapid ventricular rate.  There is no history of paroxysmal A. fib. However, he had an embolectomy of his lower left extremity by Robert. Hart Richard in 2012. He is on long-term anticoagulation. He is followed in our anticoagulation  clinic.  Prior to this he denied any tachycardia palpitations, or syncope or syncope. He denies any chest pain or ischemic symptoms. He is very active in his garden. His balance is good. Inpatient Medications  . busPIRone  7.5 mg Oral BID  . [START ON 09/16/2012] vitamin B-12  500 mcg Oral Daily  . diltiazem  60 mg Oral Q6H  . famotidine  20 mg Oral BID  . [START ON 09/16/2012] multivitamin with minerals  1 tablet Oral Daily  . [START ON 09/16/2012] omega-3 acid ethyl esters  1 g Oral Daily  . Rivaroxaban  20 mg Oral Q supper  . simvastatin  20 mg Oral q1800  . sodium chloride  3 mL Intravenous Q12H  . [START ON 09/16/2012] topiramate  75 mg Oral Daily    Family History Family History  Problem Relation Age of Onset  . Allergies Mother   . Coronary artery disease Mother   . Allergy (severe) Mother   . Allergies Father   . Stroke Father   . Heart attack Father 25  . Asthma Father   . Allergy (severe) Father   . Allergies Sister   . Allergy (severe) Sister   . Allergies Brother     x2  . Allergy (severe)  Brother   . Diabetes Brother   . Allergy (severe) Brother   . COPD Neg Hx   . Cancer Son      Social History History   Social History  . Marital Status: Married    Spouse Name: N/A    Number of Children: N/A  . Years of Education: N/A   Occupational History  . retired Therapist, music [employer]   Social History Main Topics  . Smoking status: Former Smoker -- 3.00 packs/day    Types: Cigarettes    Quit date: 02/19/1961  . Smokeless tobacco: Never Used     Comment: started at age 48  . Alcohol Use: No  . Drug Use: No  . Sexually Active: Not on file   Other Topics Concern  . Not on file   Social History Narrative  . No narrative on file     Review of Systems  General:  No chills, fever, night sweats or weight changes.  Cardiovascular:  No chest pain, dyspnea on exertion, edema, orthopnea, palpitations, paroxysmal nocturnal dyspnea. Dermatological: No rash,  lesions/masses Respiratory: No cough, dyspnea Urologic: No hematuria, dysuria Abdominal:   No nausea, vomiting, diarrhea, bright red blood per rectum, melena, or hematemesis Neurologic:  No visual changes, wkns, changes in mental status. All other systems reviewed and are otherwise negative except as noted above.  Physical Exam  Blood pressure 131/69, pulse 74, temperature 97.8 F (36.6 C), temperature source Oral, resp. rate 18, height 6\' 1"  (1.854 m), weight 137 lb 9.6 oz (62.415 kg), SpO2 99.00%.  General: Pleasant, NAD, thin Psych: Normal affect. Neuro: Alert and oriented X 3. Moves all extremities spontaneously. HEENT: Normal  Neck: Supple without bruits or JVD. Lungs:  Resp regular and unlabored, CTA. Heart: Irregular rate and rhythm, soft systolic murmur at the apex and less so border as well as a soft diastolic rumble at the apex. Abdomen: Soft, non-tender, non-distended, BS + x 4.  Extremities: No clubbing, cyanosis or edema. DP/PT/Radials 2+ and equal bilaterally.  Labs   Recent Labs  09/15/12 1140 09/15/12 1529  TROPONINI <0.30 <0.30   Lab Results  Component Value Date   WBC 7.9 09/15/2012   HGB 13.0 09/15/2012   HCT 37.5* 09/15/2012   MCV 86.6 09/15/2012   PLT 206 09/15/2012    Recent Labs Lab 09/15/12 0910  NA 138  K 4.1  CL 105  CO2 21  BUN 27*  CREATININE 1.27  CALCIUM 10.1  GLUCOSE 144*   Lab Results  Component Value Date   CHOL 130 01/11/2012   HDL 44.60 01/11/2012   LDLCALC 72 01/11/2012   TRIG 67.0 01/11/2012   No results found for this basename: DDIMER    Radiology/Studies  Dg Chest 2 View  09/15/2012   *RADIOLOGY REPORT*  Clinical Data: Weakness, syncope, COPD  CHEST - 2 VIEW  Comparison: 07/07/2010 and 02/25/2006  Findings: Cardiomediastinal silhouette is stable.  Mild hyperinflation.  Probable chronic mild interstitial prominence without pulmonary edema.  Atherosclerotic calcifications of thoracic aorta again noted.  Multilevel mild  compression fractures mid and lower thoracic spine are again noted. No segmental infiltrate.  IMPRESSION: Mild hyperinflation.  Probable chronic mild interstitial prominence without pulmonary edema.  No segmental infiltrate.  Atherosclerotic calcifications of thoracic aorta again noted.  Again noted multilevel compression deformities mid and lower thoracic spine.   Original Report Authenticated By: Natasha Mead, M.D.    ECG Cannot find one from the emergency room   ASSESSMENT AND PLAN  #1 paroxysmal atrial fibrillation  which is asymptomatic but may have precipitated micturition syncope. His risk factors for A. fib include age, mild mitral stenosis, mild aortic stenosis. He is oriented to coagulated. Rate control will be achieved with diltiazem 60 mg by mouth every 6 hours starting now. He can be converted to extended release depending on ventricular  rate and his blood pressure response. I discontinued his amlodipine. Though exam stable, we'll repeat echocardiogram since it's been one year since his last study.   Signed, Valera Castle, MD 09/15/2012, 5:40 PM

## 2012-09-15 NOTE — ED Notes (Signed)
Called report to Christy

## 2012-09-15 NOTE — ED Notes (Signed)
Pt reports that he got up to go to the bathroom this morning and felt weak, then passed out. States that he just "doesn't feel right". Reports some dizziness this morning. Denies hitting his head.

## 2012-09-15 NOTE — ED Notes (Addendum)
Pt reports waking at 3am to use restroom and getting very dizzy on the way back.  Woke again at 6 and felt as if in a tunnel and it closing.  Passed out in bathroom floor but pt denies hitting head.  Wife witnessed and says he just sat in the floor but would answer her shortly after.  Pt states his stomach just felt strange and  He was sweating. Denies any chest pain.  Denies n/v.  Pt alert oriented X4.  Pt has hx of blood clot in left leg and melanoma  on neck  Afib on monitor but pt denies hx of such.  Pt takes blood thinner for past blood clot.

## 2012-09-15 NOTE — Telephone Encounter (Signed)
Noted, Per MD's protocol if patient sent to ER, Urgent Care or with pending appointment ok to close encounter

## 2012-09-15 NOTE — ED Provider Notes (Signed)
Medical screening examination/treatment/procedure(s) were conducted as a shared visit with non-physician practitioner(s) and myself.  I personally evaluated the patient during the encounter Pt with syncope and new onset a.fib.  Also pt appears to be dehydrated.  Continues to not feel well.  Will admit for further care.  Gwyneth Sprout, MD 09/15/12 2205

## 2012-09-15 NOTE — H&P (Signed)
[facility] History and Physical  AALIJAH LANPHERE UJW:[id number] DOB: [birth year] DOA: 09/15/2012  Referring physician: Ebbie Ridge, PA-C PCP: Marga Melnick, MD  Specialists: Dr. Dietrich Pates, Cardiology  Chief Complaint: Syncopal episode  HPI: AMEDEO Richard is a 79 y.o. male with a history of Arterial Thrombosis, grade II diastolic dysfunction, COPD and anemia who presented to the ED due to syncope and dizziness that occurred earlier today. Around 0330 this AM, patient states he was urinating in the bathroom when he began feeling dizzy, but he walked back to bed.  Patients wife states patient was in the bathroom again around 0630 this AM, urinating, when he called to her to come help him.  Patient stated he felt at that time that he might pass out.  Patients wife stated patient passed out and she caught him before he fell to the ground.   He states that when these episodes occurred, his stomach and legs felt "strange", and the strange feeling was "coming up through my stomach".  He states he feels weak now, especially in his legs. He was feeling fine yesterday.  He was recently diagnosed with grade 2 diastolic dysfunction (07/2012 on echo).  He denies chest pain, palpitations, shortness of breath, exercise intolerance, nausea or vomiting.   Review of Systems: The patient denies anorexia, fever, weight loss, chest pain, dyspnea on exertion, peripheral edema, balance deficits, abdominal pain, melena, hematochezia, difficulty walking,  unusual weight change, abnormal bleeding, .    Past Medical History  Diagnosis Date  . Anemia   . Colon polyp   . COPD (chronic obstructive pulmonary disease)     Astmatic bronchitis  . Asthma with bronchitis   . Gout     PMH  . Osteoporosis   . Allergic rhinitis   . Skin cancer, basal cell     Dr Margo Aye, PMH  . Nephrolithiasis   . HLD (hyperlipidemia)   . Skin cancer (melanoma) 2012    Right neck   Past Surgical History  Procedure Laterality Date  .  Finger surgery      for foreign body  . Colonoscopy w/ polypectomy      Dr Arlyce Dice  . Inguinal hernia repair    . Nose surgery      Septal Deviation  . Neck surgery  12/2010    Melanoma ; [facility]   . Leg surgery  06/2010     Dr.Lawson, for blood clott. Left leg    Social History:  reports that he quit smoking about 51 years ago. His smoking use included Cigarettes. He smoked 3.00 packs per day. He has never used smokeless tobacco. He reports that he does not drink alcohol or use illicit drugs. Lives at home with wife Can participate in ADLs  Allergies  Allergen Reactions  . Ivp Dye (Iodinated Diagnostic Agents) Hives    Family History  Problem Relation Age of Onset  . Allergies Mother   . Coronary artery disease Mother   . Allergy (severe) Mother   . Allergies Father   . Stroke Father   . Heart attack Father 63  . Asthma Father   . Allergy (severe) Father   . Allergies Sister   . Allergy (severe) Sister   . Allergies Brother     x2  . Allergy (severe) Brother   . Diabetes Brother   . Allergy (severe) Brother   . COPD Neg Hx   . Cancer Son     Prior to Admission medications   Medication  Sig Start Date End Date Taking? Authorizing Provider  AMBULATORY NON FORMULARY MEDICATION Lidocaine Hydrochlorid Spray, daily as needed    Yes Historical Provider, MD  amLODipine (NORVASC) 10 MG tablet TAKE ONE-HALF TABLET DAILY 07/07/12  Yes Pecola Lawless, MD  amoxicillin-clavulanate (AUGMENTIN) 500-125 MG per tablet Take with a meal 10/17/11  Yes Pecola Lawless, MD  busPIRone (BUSPAR) 15 MG tablet TAKE 1/2 TABLET TWICE DAILY 05/07/12  Yes Pecola Lawless, MD  Calcium Carbonate-Vit D-Min (CALTRATE 600+D PLUS PO) Take by mouth 2 (two) times daily.     Yes Historical Provider, MD  fluticasone (FLONASE) 50 MCG/ACT nasal spray Place 1 spray into the nose 2 (two) times daily as needed for rhinitis. 08/16/11 09/15/12 Yes Pecola Lawless, MD  ibuprofen (ADVIL,MOTRIN) 200 MG tablet  Take 200 mg by mouth as needed for pain.   Yes Historical Provider, MD  ketotifen (ZADITOR) 0.025 % ophthalmic solution Place 1 drop into both eyes daily as needed.     Yes Historical Provider, MD  Multiple Vitamin (MULTIVITAMIN) tablet Take 1 tablet by mouth daily.     Yes Historical Provider, MD  Omega-3 Fatty Acids (FISH OIL CONCENTRATE PO) Take 1,400 mg by mouth.     Yes Historical Provider, MD  pravastatin (PRAVACHOL) 40 MG tablet 1 by mouth daily, Call for appointment 07/31/12  Yes Pecola Lawless, MD  ranitidine (ZANTAC) 150 MG tablet TAKE ONE TABLET TWICE DAILY 05/07/12  Yes Pecola Lawless, MD  Rivaroxaban (XARELTO) 20 MG TABS Take 1 tablet (20 mg total) by mouth daily with supper. 08/28/12  Yes Pricilla Riffle, MD  SF 5000 PLUS 1.1 % CREA dental cream As directed 02/21/11  Yes Historical Provider, MD  sildenafil (VIAGRA) 100 MG tablet 1/2-1 qd prn 08/16/11  Yes Pecola Lawless, MD  Specialty Vitamins Products (MAGNESIUM, AMINO ACID CHELATE,) 133 MG tablet Take 1 tablet by mouth daily.     Yes Historical Provider, MD  topiramate (TOPAMAX) 25 MG capsule Take 25 mg by mouth. 3 by mouth in am    Yes Historical Provider, MD  vitamin B-12 (CYANOCOBALAMIN) 500 MCG tablet Take 500 mcg by mouth daily.     Yes Historical Provider, MD   Physical Exam: Filed Vitals:   09/15/12 1033 09/15/12 1039 09/15/12 1307 09/15/12 1404  BP: 118/62 118/68 104/69 131/69  Pulse: 117 135 80 74  Temp:    97.8 F (36.6 C)  TempSrc:      Resp:    18  Height:    6\' 1"  (1.854 m)  Weight:    62.415 kg (137 lb 9.6 oz)  SpO2:  98% 96% 99%     General: elderly, thin, pleasant male, Awake and alert, w/ clear speech  Neck:Supple, no JVD  Cardiovascular: irreg irreg, 2/6 systolic murmur, no rubs or gallops  Respiratory: B/L clear  Abdomen: Soft, BS+, non-tender, non-distended  Skin: warm, dry, no lesions  Musculoskeletal: Full rom, no clubbing, cyanosis or edema  Neurologic: Non focal  Labs on Admission:   Basic Metabolic Panel:  Recent Labs Lab 09/15/12 0910  NA 138  K 4.1  CL 105  CO2 21  GLUCOSE 144*  BUN 27*  CREATININE 1.27  CALCIUM 10.1   CBC:  Recent Labs Lab 09/15/12 0910  WBC 7.9  NEUTROABS 4.8  HGB 13.0  HCT 37.5*  MCV 86.6  PLT 206   Cardiac Enzymes:  Recent Labs Lab 09/15/12 1140  TROPONINI <0.30    Radiological Exams on Admission:  Dg Chest 2 View  09/15/2012   *RADIOLOGY REPORT*  Clinical Data: Weakness, syncope, COPD  CHEST - 2 VIEW  Comparison: 07/07/2010 and 02/25/2006  Findings: Cardiomediastinal silhouette is stable.  Mild hyperinflation.  Probable chronic mild interstitial prominence without pulmonary edema.  Atherosclerotic calcifications of thoracic aorta again noted.  Multilevel mild compression fractures mid and lower thoracic spine are again noted. No segmental infiltrate.  IMPRESSION: Mild hyperinflation.  Probable chronic mild interstitial prominence without pulmonary edema.  No segmental infiltrate.  Atherosclerotic calcifications of thoracic aorta again noted.  Again noted multilevel compression deformities mid and lower thoracic spine.   Original Report Authenticated By: Natasha Mead, M.D.    Assessment/Plan  Syncopal episode -Unknown cause, possibly related to new onset a-fib with rvr -Urinalysis negative -Last 2D echo done on 07/31/2011 showed estimated ejection fraction of 60% to 65%, grade 2 diastolic dysfunction, mild aortic stenosis, mild mitral stenosis and regurgitation. -Test for orthostatic hypotension done in ED-negative -Ordered doppler carotid, troponin I, TSH and BMP  Atrial fibrillation with rvr -New onset of a-fib -Cardiology consulted  Hx of COPD -stable  Hx of Hyperlipidemia -Continue home meds  Hx of DVT -On Xarelto at home  Hx of migraine headaches -Continue topiramate  Consults: Cardiology  Code Status: Full Family Communication:Wife is bedside with patient Disposition Plan: Inpatient  Time spent:  [address]  [place], [place] [phone] [facility]  If 7PM-7AM, please contact night-coverage [email or web address] Password [facility] 09/15/2012, 2:27 PM

## 2012-09-15 NOTE — Progress Notes (Signed)
Utilization review complete. Cline Draheim RN CCM Case Mgmt phone [phone] 

## 2012-09-15 NOTE — Telephone Encounter (Signed)
Patient Information:  Caller Name: [name]  Phone: [phone]  Patient: Robert Richard, Robert Richard  Gender: Male  DOB: 10-Jul-1930  Age: 79 Years  PCP: Marga Melnick  Office Follow Up:  Does the office need to follow up with this patient?: No  Instructions For The Office: N/A  RN Note:  Rn called office concerning ED Disposition and Sandra/ office RN advised pt needs to go to [facility] ED now and wife agreed.  Symptoms  Reason For Call & Symptoms: Today, 09/15/2012, Spouse / [name] calling stating at 0300  this morning pt  c/o of dizziness ,  and was  sweaty, clammy .  At 0630  he had  same complaints and  passed out , Pt is on the way to office and denies dizziness,  but still feels weak. NO chest pain.  Reviewed Health History In EMR: Yes  Reviewed Medications In EMR: Yes  Reviewed Allergies In EMR: Yes  Reviewed Surgeries / Procedures: Yes  Date of Onset of Symptoms: 09/14/2012  Guideline(s) Used:  Dizziness  Fainting  Disposition Per Guideline:   Go to ED Now  Reason For Disposition Reached:   Fainted > 15 minutes ago and still feels weak or dizzy  Advice Given:  N/A  Patient Will Follow Care Advice:  YES

## 2012-09-15 NOTE — H&P (Signed)
Addendum  Patient seen and examined, chart and data base reviewed.  I agree with the above assessment and plan.  For full details please see Mrs. Algis Downs PA note.  Came in with atrial fibrillation with rapid ventricular response, cardiology to evaluate.  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 Has paroxysmal atrial fibrillation since then.   Clint Lipps, MD [facility] Pager: [phone] 09/15/2012, 5:04 PM

## 2012-09-16 ENCOUNTER — Encounter (HOSPITAL_COMMUNITY): Payer: Self-pay | Admitting: General Practice

## 2012-09-16 DIAGNOSIS — R55 Syncope and collapse: Secondary | ICD-10-CM

## 2012-09-16 DIAGNOSIS — I359 Nonrheumatic aortic valve disorder, unspecified: Secondary | ICD-10-CM

## 2012-09-16 DIAGNOSIS — Z7901 Long term (current) use of anticoagulants: Secondary | ICD-10-CM

## 2012-09-16 LAB — TROPONIN I: Troponin I: <0.3 ng/mL (ref <0.30)

## 2012-09-16 LAB — BASIC METABOLIC PANEL
BUN: 28 mg/dL — ABNORMAL HIGH (ref 6–23)
CO2: 21 mEq/L (ref 19–32)
Calcium: 9 mg/dL (ref 8.4–10.5)
Chloride: 107 mEq/L (ref 96–112)
Creatinine, Ser: 1.4 mg/dL — ABNORMAL HIGH (ref 0.50–1.35)
GFR calc Af Amer: 53 mL/min — ABNORMAL LOW (ref >90)
GFR calc non Af Amer: 46 mL/min — ABNORMAL LOW (ref >90)
Glucose, Bld: 105 mg/dL — ABNORMAL HIGH (ref 70–99)
Potassium: 3.9 mEq/L (ref 3.5–5.1)
Sodium: 138 mEq/L (ref 135–145)

## 2012-09-16 MED ORDER — DILTIAZEM HCL 30 MG PO TABS
30.0000 mg | ORAL_TABLET | Freq: Four times a day (QID) | ORAL | Status: DC
  Filled 2012-09-16 (×5): qty 1

## 2012-09-16 MED ORDER — DILTIAZEM HCL ER COATED BEADS 180 MG PO CP24: 120.0000 mg | ORAL_CAPSULE | Freq: Every day | ORAL | Status: DC

## 2012-09-16 MED ORDER — DILTIAZEM HCL ER COATED BEADS 120 MG PO CP24
120.0000 mg | ORAL_CAPSULE | Freq: Every day | ORAL | Status: DC
  Administered 2012-09-16: 120 mg via ORAL
  Filled 2012-09-16: qty 1

## 2012-09-16 MED ORDER — DILTIAZEM HCL ER 60 MG PO CP12
60.0000 mg | ORAL_CAPSULE | Freq: Two times a day (BID) | ORAL | Status: AC
  Administered 2012-09-16: 60 mg via ORAL
  Filled 2012-09-16: qty 1

## 2012-09-16 MED ORDER — DILTIAZEM HCL ER COATED BEADS 120 MG PO CP24: 120.0000 mg | ORAL_CAPSULE | Freq: Every day | ORAL | Status: DC

## 2012-09-16 MED ORDER — DILTIAZEM HCL 60 MG PO TABS: 60.0000 mg | ORAL_TABLET | Freq: Once | ORAL | Status: DC

## 2012-09-16 NOTE — Evaluation (Signed)
Occupational Therapy Evaluation Patient Details Name: Robert Richard MRN: [id number] DOB: November 27, 1930 Today's Date: 09/16/2012 Time: 1478-2956 OT Time Calculation (min): 27 min  OT Assessment / Plan / Recommendation History of present illness Robert Richard is a 79 y.o. male with a history of Arterial Thrombosis, grade II diastolic dysfunction, COPD and anemia who presented to the ED due to syncope and dizziness that occurred earlier today. Around 0330 this AM, patient states he was urinating in the bathroom when he began feeling dizzy, but he walked back to bed.  Patients wife states patient was in the bathroom again around 0630 this AM, urinating, when he called to her to come help him.  Patient stated he felt at that time that he might pass out.  Patients wife stated patient passed out and she caught him before he fell to the ground.   He states that when these episodes occurred, his stomach and legs felt "strange", and the strange feeling was "coming up through my stomach".  He states he feels weak now, especially in his legs. He was feeling fine yesterday.  He was recently diagnosed with grade 2 diastolic dysfunction (07/2012 on echo).  He denies chest pain, palpitations, shortness of breath, exercise intolerance, nausea or vomiting.   Clinical Impression   Pt presents to OT at overall S level with ADL activity. Wife is able to provide ADL activity as needed    OT Assessment  Patient does not need any further OT services    Follow Up Recommendations  No OT follow up       Equipment Recommendations  None recommended by OT          Precautions / Restrictions Precautions Precautions: Fall       ADL  Grooming: Performed;Wash/dry hands;Supervision/safety Where Assessed - Grooming: Unsupported standing Upper Body Dressing: Simulated;Set up Where Assessed - Upper Body Dressing: Unsupported sitting Lower Body Dressing: Performed;Minimal assistance Where Assessed - Lower Body Dressing:  Unsupported sit to stand Toilet Transfer: Performed;Supervision/safety Toilet Transfer Method: Sit to Barista: Regular height toilet Toileting - Clothing Manipulation and Hygiene: Performed;Supervision/safety Where Assessed - Toileting Clothing Manipulation and Hygiene: Standing Transfers/Ambulation Related to ADLs: Wife is able to assist with ADL activity as needed          Visit Information  Last OT Received On: 09/16/12 History of Present Illness: Robert Richard is a 79 y.o. male with a history of Arterial Thrombosis, grade II diastolic dysfunction, COPD and anemia who presented to the ED due to syncope and dizziness that occurred earlier today. Around 0330 this AM, patient states he was urinating in the bathroom when he began feeling dizzy, but he walked back to bed.  Patients wife states patient was in the bathroom again around 0630 this AM, urinating, when he called to her to come help him.  Patient stated he felt at that time that he might pass out.  Patients wife stated patient passed out and she caught him before he fell to the ground.   He states that when these episodes occurred, his stomach and legs felt "strange", and the strange feeling was "coming up through my stomach".  He states he feels weak now, especially in his legs. He was feeling fine yesterday.  He was recently diagnosed with grade 2 diastolic dysfunction (07/2012 on echo).  He denies chest pain, palpitations, shortness of breath, exercise intolerance, nausea or vomiting.       Prior Functioning     Home Living  Family/patient expects to be discharged to:: Private residence Living Arrangements: Spouse/significant other Available Help at Discharge: Family Type of Home: House Home Access: Stairs to enter Secretary/administrator of Steps: 2 Entrance Stairs-Rails: Can reach both Home Layout: One level Home Equipment: None Prior Function Level of Independence:  Independent Communication Communication: No difficulties         Vision/Perception Vision - History Baseline Vision: Wears glasses all the time Patient Visual Report: No change from baseline   Cognition  Cognition Arousal/Alertness: Awake/alert Behavior During Therapy: WFL for tasks assessed/performed    Extremity/Trunk Assessment Upper Extremity Assessment Upper Extremity Assessment: Overall WFL for tasks assessed Lower Extremity Assessment Lower Extremity Assessment: Overall WFL for tasks assessed     Mobility Bed Mobility Bed Mobility: Supine to Sit Supine to Sit: 7: Independent Transfers Transfers: Sit to Stand;Stand to Sit Sit to Stand: 5: Supervision;From bed;From toilet;Without upper extremity assist Stand to Sit: 5: Supervision;To toilet;To bed;Without upper extremity assist           End of Session OT - End of Session Activity Tolerance: Patient tolerated treatment well Patient left: in bed;with call bell/phone within reach;with nursing/sitter in room       [facility], [place] Kung 09/16/2012, 11:34 AM

## 2012-09-16 NOTE — Progress Notes (Signed)
Reviewed discharge instructions with patient and family, they stated their understanding.  After 60mg  dose of cardizem this afternoon, patient ambulated in hallway and heart rate up to 120-130's while ambulating.  Down to 70-90's at rest.  [name] PA with [facility] and [facility] PA updated and stated it was ok for patient to discharge home with wife.  Patient discharged via wheelchair home with wife.  Robert Richard

## 2012-09-16 NOTE — Discharge Summary (Signed)
Addendum  Patient seen and examined, chart and data base reviewed.  I agree with the above assessment and plan.  For full details please see Mrs. Algis Downs PA note.  Syncopal episode, atrial fibrillation with RVR.  Discharge on Xarelto and Cardizem.   Clint Lipps, MD [facility] Pager: [phone] 09/16/2012, 2:30 PM

## 2012-09-16 NOTE — Evaluation (Signed)
Physical Therapy Evaluation Robert Richard Details Name: Robert Richard MRN: [id number] DOB: [birth year] Today's Date: 09/16/2012 Time: 2725-3664 PT Time Calculation (min): 26 min  PT Assessment / Plan / Recommendation History of Present Illness  Robert Richard is a 79 y.o. male with a history of Arterial Thrombosis, grade II diastolic dysfunction, COPD and anemia who presented to the ED due to syncope and dizziness that occurred earlier today. Around 0330 this AM, Robert Richard states he was urinating in the bathroom when he began feeling dizzy, but he walked back to bed.  Patients wife states Robert Richard was in the bathroom again around 0630 this AM, urinating, when he called to her to come help him.  Robert Richard stated he felt at that time that he might pass out.  Patients wife stated Robert Richard passed out and she caught him before he fell to the ground.   He states that when these episodes occurred, his stomach and legs felt "strange", and the strange feeling was "coming up through my stomach".  He states he feels weak now, especially in his legs. He was feeling fine yesterday.  He was recently diagnosed with grade 2 diastolic dysfunction (07/2012 on echo).  He denies chest pain, palpitations, shortness of breath, exercise intolerance, nausea or vomiting.   Clinical Impression  Robert Richard at baseline level of mobility. Steady with ambulation. Encouraged to ambulate with family while here in hospital to prevent mobility decline. Educated Robert Richard on expectations for mobility and health maintenance upon discharge. No Acute PT needed at this time, PT will sign off, family and Robert Richard in agreement.    PT Assessment  Patent does not need any further PT services    Follow Up Recommendations  No PT follow up          Equipment Recommendations  None recommended by PT          Precautions / Restrictions Precautions Precautions: Fall   Pertinent Vitals/Pain No pain at this time      Mobility  Bed Mobility Bed  Mobility: Supine to Sit Supine to Sit: 7: Independent Transfers Transfers: Sit to Stand;Stand to Sit Sit to Stand: 5: Supervision;From bed;From toilet;Without upper extremity assist Stand to Sit: 5: Supervision;To toilet;To bed;Without upper extremity assist Ambulation/Gait Ambulation/Gait Assistance: 5: Supervision Ambulation Distance (Feet): 300 Feet Assistive device: None Ambulation/Gait Assistance Details: steady with gait no assistance required, supervision only secondary to medical workup still in process Gait velocity: mildly decreased Stairs: Yes Stairs Assistance: 5: Supervision Stair Management Technique: Two rails;Alternating pattern;Forwards Number of Stairs: 5        PT Goals(Current goals can be found in the care plan section) Acute Rehab PT Goals Robert Richard Stated Goal: to go home PT Goal Formulation: No goals set, d/c therapy  Visit Information  Last PT Received On: 09/16/12 Assistance Needed: +1 History of Present Illness: Robert Richard is a 79 y.o. male with a history of Arterial Thrombosis, grade II diastolic dysfunction, COPD and anemia who presented to the ED due to syncope and dizziness that occurred earlier today. Around 0330 this AM, Robert Richard states he was urinating in the bathroom when he began feeling dizzy, but he walked back to bed.  Patients wife states Robert Richard was in the bathroom again around 0630 this AM, urinating, when he called to her to come help him.  Robert Richard stated he felt at that time that he might pass out.  Patients wife stated Robert Richard passed out and she caught him before he fell to the ground.   He  states that when these episodes occurred, his stomach and legs felt "strange", and the strange feeling was "coming up through my stomach".  He states he feels weak now, especially in his legs. He was feeling fine yesterday.  He was recently diagnosed with grade 2 diastolic dysfunction (07/2012 on echo).  He denies chest pain, palpitations, shortness of  breath, exercise intolerance, nausea or vomiting.       Prior Functioning  Home Living Family/Robert Richard expects to be discharged to:: Private residence Living Arrangements: Spouse/significant other Available Help at Discharge: Family Type of Home: House Home Access: Stairs to enter Secretary/administrator of Steps: 2 Entrance Stairs-Rails: Can reach both Home Layout: One level Home Equipment: None Prior Function Level of Independence: Independent Communication Communication: No difficulties    Cognition  Cognition Arousal/Alertness: Awake/alert Behavior During Therapy: WFL for tasks assessed/performed    Extremity/Trunk Assessment Upper Extremity Assessment Upper Extremity Assessment: Overall WFL for tasks assessed Lower Extremity Assessment Lower Extremity Assessment: Overall WFL for tasks assessed   Balance Balance Balance Assessed: Yes Standardized Balance Assessment Standardized Balance Assessment: Dynamic Gait Index Dynamic Gait Index Level Surface: Normal Change in Gait Speed: Normal Gait with Horizontal Head Turns: Normal Gait with Vertical Head Turns: Normal Gait and Pivot Turn: Normal Step Over Obstacle: Normal Step Around Obstacles: Normal Steps: Normal Total Score: 24 High Level Balance High Level Balance Activites: Side stepping;Backward walking;Direction changes;Turns;Head turns High Level Balance Comments: steady  End of Session PT - End of Session Equipment Utilized During Treatment: Gait belt Activity Tolerance: Robert Richard tolerated treatment well Robert Richard left: in bed;with call bell/phone within reach;with family/visitor present Nurse Communication: Mobility status  GP     Fabio Asa 09/16/2012, 11:49 AM Charlotte Crumb, PT DPT  [phone]

## 2012-09-16 NOTE — Progress Notes (Addendum)
*  PRELIMINARY RESULTS* Vascular Ultrasound Carotid Duplex (Doppler) has been completed.  Preliminary findings: Right=  Less than 39% ICA stenosis.  Left = 60-79% mid ICA stenosis by velocity. Increased velocities could be due to tortuosity of vessel.  Vertebral artery flow is antegrade.     Farrel Demark, RDMS, RVT  09/16/2012, 9:59 AM

## 2012-09-16 NOTE — Progress Notes (Signed)
Pt HR 50-65 most of early morning, pt asleep. Currently in the mid 50's and has 60mg  of PO cardizem due. Maren Reamer, NP paged and ordered to hold until cardiology assess in the am. Will pass inform day shift RN.

## 2012-09-16 NOTE — Progress Notes (Signed)
Pt's HR now in the 50's, occasionally 40's this AM (afib) - came down quite a bit with addition of diltiazem 60mg  q6hr overnight. Initially I was going to decrease dose to 30mg  q6hr but decided to hold off on dosing this AM until seen by MD in case we want to transition to long-acting form today. Nursing aware. Dayna Dunn PA-C

## 2012-09-16 NOTE — Progress Notes (Signed)
Echocardiogram 2D Echocardiogram has been performed.  Robert Richard 09/16/2012, 10:21 AM

## 2012-09-16 NOTE — Discharge Summary (Signed)
Physician Discharge Summary  Robert Richard ZOX:[id number] DOB: August 12, 1930 DOA: 09/15/2012  PCP: Marga Melnick, MD  Admit date: 09/15/2012 Discharge date: 09/16/2012  Time spent: 45 minutes  Recommendations for Outpatient Follow-up:  Discharge home and follow up with Dr. Dietrich Pates  of [facility] cardiology in 1 week. Please review ECHO results from 09/16/12 Newly started on Diltiazem for Afib with RVR.  Previously on Xeralto - this will be continued.  Discharge Diagnoses:  Syncopal episode New onset atrial fibrillation with rvr Hyperlipidemia COPD  Discharge Condition: Stable  Diet recommendation: Heart healthy  Filed Weights   09/15/12 1404  Weight: 62.415 kg (137 lb 9.6 oz)    History of present illness:  Robert Richard is a 79 y.o. Male, who's typically very active, with a history of arterial thrombosis, grade II diastolic dysfunction, COPD and anemia who presented to the ED due to an episode of syncope and dizziness that occurred on 6/28 during urination. Prior to that episode he was feeling fine and had no other complaints.  On his arrival to the ED he complained of feeling weak. He was recently diagnosed with grade 2 diastolic dysfunction (07/2011 on echo).  This AM (7/29) he states he no longer has any symptoms and feels good.  He got up and walked to the bathroom this AM without any difficulty.   Hospital Course:   Syncopal episode Patient presented to the ED with a single syncopal episode that occurred early yesterday (7/28) morning while he was urinating.  On arrival to the ED, he continued to feel weak.  A urinalysis was obtained which was negative.  His last 2D echo was done on 07/31/2011 and showed estimated ejection fraction of 60% to 65%, grade 2 diastolic dysfunction, mild aortic stenosis, mild mitral stenosis and regurgitation.  He had another 2D echo today and patient will follow up with cardiology for results.  A test for orthostatic hypotension was done in the ED and  was negative.  Troponin was negative and TSH was WNL.  His new onset of a-fib might have precipitated micturition syncope.    Atrial fibrillation with rvr Patient presented to the ED due to his syncopal episode. EKG showed paroxysmal atrial fibrillation with rvr.  His risk factors for a-fib include age, mild mitral stenosis and mild aortic stenosis.  He is Xarelto due to his hx of DVT.  Rate control was initially achieved with diltiazem 60mg  po q 6 hours, however he developed bradycardia overnight last night.  This AM he was switched to diltiazem 180 mg extended release.  His amlodipine was discontinued.   Left Carotid Stenosis 60 - 79% based on carotid doppler. Cardiology aware. Monitor  Hx of DVT Stable.  He continued Xarelto while inpatient.  Hx of hyperlipidemia Stable.  He continued home meds while inpatient.  Hx of COPD He remained stable while inpatient.  Hx of migraine headaches Continued topiramate while inpatient.  Procedures:  Cardiology ordered 2D echo that was done this AM, will not wait on results to d/c patient.  Patient is following up with cardiology outpatient.  Consultations:  Cardiology  Discharge Exam: Filed Vitals:   09/15/12 1741 09/15/12 1743 09/15/12 2026 09/16/12 0535  BP: 108/71 115/67 100/76 107/59  Pulse: 119 123 98 52  Temp:   99.7 F (37.6 C) 98 F (36.7 C)  TempSrc:   Oral Oral  Resp:   18 18  Height:      Weight:      SpO2:   97% 97%  Gen Exam: Elderly and thin. Awake and alert with clear speech. NAD. Positive affect.  Neck: Supple, No JVD.  Chest: B/L Clear.  CVS: Irregular rate and rhythm, soft systolic murmur at apex  Abdomen: soft, BS +, non tender, non distended.  Extremities: no edema, lower extremities warm to touch  Neurologic: Non Focal.  Skin: No Rash.   Discharge Instructions      Discharge Orders   Future Appointments Provider Department Dept Phone   02/27/2013 2:00 PM [facility]  [facility] [phone]   Future Orders Complete By Expires     Diet - low sodium heart healthy  As directed     Increase activity slowly  As directed         Medication List    STOP taking these medications       amLODipine 10 MG tablet  Commonly known as:  NORVASC     amoxicillin-clavulanate 500-125 MG per tablet  Commonly known as:  AUGMENTIN      TAKE these medications       AMBULATORY NON FORMULARY MEDICATION  Lidocaine Hydrochlorid Spray, daily as needed     busPIRone 15 MG tablet  Commonly known as:  BUSPAR  TAKE 1/2 TABLET TWICE DAILY     CALTRATE 600+D PLUS PO  Take by mouth 2 (two) times daily.     diltiazem 180 MG 24 hr capsule  Commonly known as:  CARDIZEM CD  Take 1 capsule (180 mg total) by mouth daily.     ferrous sulfate 325 (65 FE) MG tablet  Take 325 mg by mouth daily with breakfast.     FISH OIL CONCENTRATE PO  Take 1,400 mg by mouth.     fluticasone 50 MCG/ACT nasal spray  Commonly known as:  FLONASE  Place 1 spray into the nose 2 (two) times daily as needed for rhinitis.     ibuprofen 200 MG tablet  Commonly known as:  ADVIL,MOTRIN  Take 200 mg by mouth as needed for pain.     ketotifen 0.025 % ophthalmic solution  Commonly known as:  ZADITOR  Place 1 drop into both eyes daily as needed.     magnesium (amino acid chelate) 133 MG tablet  Take 1 tablet by mouth daily.     multivitamin tablet  Take 1 tablet by mouth daily.     pravastatin 40 MG tablet  Commonly known as:  PRAVACHOL  1 by mouth daily, Call for appointment     ranitidine 150 MG tablet  Commonly known as:  ZANTAC  TAKE ONE TABLET TWICE DAILY     Rivaroxaban 20 MG Tabs  Commonly known as:  XARELTO  Take 1 tablet (20 mg total) by mouth daily with supper.     SF 5000 PLUS 1.1 % Crea dental cream  Generic drug:  sodium fluoride  As directed     sildenafil 100 MG tablet  Commonly known as:  VIAGRA  1/2-1 qd prn     topiramate 25 MG capsule  Commonly known  as:  TOPAMAX  Take 25 mg by mouth. 3 by mouth in am     vitamin B-12 500 MCG tablet  Commonly known as:  CYANOCOBALAMIN  Take 500 mcg by mouth daily.       Allergies  Allergen Reactions  . Ivp Dye (Iodinated Diagnostic Agents) Hives   Follow-up Information   Follow up with Marga Melnick, MD. Schedule an appointment as soon as possible for a visit in 2  weeks.   Contact information:   [address] [facility] [address] [place] [address] [phone]       Follow up with Dietrich Pates, MD. Call in 1 week.   Contact information:   [address] [place] [address] [phone]        The results of significant diagnostics from this hospitalization (including imaging, microbiology, ancillary and laboratory) are listed below for reference.    Significant Diagnostic Studies: Dg Chest 2 View  09/15/2012   *RADIOLOGY REPORT*  Clinical Data: Weakness, syncope, COPD  CHEST - 2 VIEW  Comparison: 07/07/2010 and 02/25/2006  Findings: Cardiomediastinal silhouette is stable.  Mild hyperinflation.  Probable chronic mild interstitial prominence without pulmonary edema.  Atherosclerotic calcifications of thoracic aorta again noted.  Multilevel mild compression fractures mid and lower thoracic spine are again noted. No segmental infiltrate.  IMPRESSION: Mild hyperinflation.  Probable chronic mild interstitial prominence without pulmonary edema.  No segmental infiltrate.  Atherosclerotic calcifications of thoracic aorta again noted.  Again noted multilevel compression deformities mid and lower thoracic spine.   Original Report Authenticated By: Natasha Mead, M.D.   Carotid Duplex (Doppler) has been completed. Preliminary findings: Right= Less than 39% ICA stenosis. Left = 60-79% mid ICA stenosis by velocity. Increased velocities could be due to tortuosity of vessel.  Vertebral artery flow is antegrade.     Labs: Basic Metabolic Panel:  Recent Labs Lab 09/15/12 0910  09/15/12 1529 09/16/12 0241  NA 138 138 138  K 4.1 4.2 3.9  CL 105 105 107  CO2 21 21 21   GLUCOSE 144* 156* 105*  BUN 27* 28* 28*  CREATININE 1.27 1.46* 1.40*  CALCIUM 10.1 9.5 9.0   CBC:  Recent Labs Lab 09/15/12 0910  WBC 7.9  NEUTROABS 4.8  HGB 13.0  HCT 37.5*  MCV 86.6  PLT 206   Cardiac Enzymes:  Recent Labs Lab 09/15/12 1140 09/15/12 1529 09/15/12 2032 09/16/12 0241  TROPONINI <0.30 <0.30 <0.30 <0.30      Signed:  Marco Collie, PA-C [phone] [facility] 09/16/2012, 1:35 PM

## 2012-09-16 NOTE — Progress Notes (Signed)
Patient ID: Robert Richard, male   DOB: 09/03/1930, 79 y.o.   MRN: [id number]   CARDIOLOGY CONSULT NOTE  Patient ID: Robert Richard MRN: [id number], DOB/AGE: August 11, 77, 1932   Admit date: 09/15/2012 Date of Consult: 09/16/2012   Primary Physician: Marga Melnick, MD Primary Cardiologist: Dietrich Pates MD  Pt. Profile  Robert Richard 79 year old quite active married white male from [place] admitted with micturition syncope and new onset A. Fib. HR low on telemtry this am 79's      . atorvastatin  10 mg Oral q1800  . busPIRone  7.5 mg Oral BID  . vitamin B-12  500 mcg Oral Daily  . diltiazem  30 mg Oral Q6H  . famotidine  20 mg Oral BID  . multivitamin with minerals  1 tablet Oral Daily  . omega-3 acid ethyl esters  1 g Oral Daily  . Rivaroxaban  20 mg Oral Q supper  . sodium chloride  3 mL Intravenous Q12H  . topiramate  75 mg Oral Daily         Physical Exam  Blood pressure 107/59, pulse 52, temperature 98 F (36.7 C), temperature source Oral, resp. rate 18, height 6\' 1"  (1.854 m), weight 137 lb 9.6 oz (62.415 kg), SpO2 97.00%.  General: Pleasant, NAD, thin Psych: Normal affect. Neuro: Alert and oriented X 3. Moves all extremities spontaneously. HEENT: Normal  Neck: Supple without bruits or JVD. Lungs:  Resp regular and unlabored, CTA. Heart: Irregular rate and rhythm, soft systolic murmur at the apex and less so border as well as a soft diastolic rumble at the apex. Abdomen: Soft, non-tender, non-distended, BS + x 4.  Extremities: No clubbing, cyanosis or edema. DP/PT/Radials 2+ and equal bilaterally.  Labs     Recent Labs Lab 09/16/12 0241  NA 138  K 3.9  CL 107  CO2 21  BUN 28*  CREATININE 1.40*  CALCIUM 9.0  GLUCOSE 105*   Lab Results  Component Value Date   CHOL 130 01/11/2012   HDL 44.60 01/11/2012   LDLCALC 72 01/11/2012   TRIG 67.0 01/11/2012   No results found for this basename: DDIMER   Scheduled Meds: . atorvastatin  10 mg Oral q1800  .  busPIRone  7.5 mg Oral BID  . vitamin B-12  500 mcg Oral Daily  . diltiazem  30 mg Oral Q6H  . famotidine  20 mg Oral BID  . multivitamin with minerals  1 tablet Oral Daily  . omega-3 acid ethyl esters  1 g Oral Daily  . Rivaroxaban  20 mg Oral Q supper  . sodium chloride  3 mL Intravenous Q12H  . topiramate  75 mg Oral Daily   Continuous Infusions: . sodium chloride 50 mL/hr at 09/15/12 1847   PRN Meds:.acetaminophen, acetaminophen, fluticasone, morphine injection, olopatadine  Radiology/Studies  Dg Chest 2 View  09/15/2012   *RADIOLOGY REPORT*  Clinical Data: Weakness, syncope, COPD  CHEST - 2 VIEW  Comparison: 07/07/2010 and 02/25/2006  Findings: Cardiomediastinal silhouette is stable.  Mild hyperinflation.  Probable chronic mild interstitial prominence without pulmonary edema.  Atherosclerotic calcifications of thoracic aorta again noted.  Multilevel mild compression fractures mid and lower thoracic spine are again noted. No segmental infiltrate.  IMPRESSION: Mild hyperinflation.  Probable chronic mild interstitial prominence without pulmonary edema.  No segmental infiltrate.  Atherosclerotic calcifications of thoracic aorta again noted.  Again noted multilevel compression deformities mid and lower thoracic spine.   Original Report Authenticated By: Natasha Mead, M.D.    ECG Cannot find  one from the emergency room   ASSESSMENT AND PLAN PAF:  Change to long acting cardizem at lower dose.  On xaretlto for anticoagulation Mild AS murmur on exam Ok to d/c home can have echo as outpatient if not done in timely fashion this am  F/U with Dr Roselee Nova in our [place] office  Signed, Charlton Haws, MD 09/16/2012, 8:00 AM

## 2012-09-19 ENCOUNTER — Telehealth: Payer: Self-pay | Admitting: Internal Medicine

## 2012-09-19 NOTE — Telephone Encounter (Signed)
Spoke with patient's wife, patient to be seen Monday 09/22/12 at 4:30 for hospital follow-up, I offered patient to be seen today at [facility] office by Nicki Reaper, patient's wife indicated she would rather follow instructions given by RN and if for some reason it gets worse she will follow the protocol and be seen at Saturday clinic, other wise she would rather wait until Monday to see Dr.Hopper. Patient's wife thinks rash and jittery feeling is related to this New Medication in which she will discuss with Dr.Hopper on Monday. Message will be sent to [facility] to verify that he agrees with course of action(s) taken

## 2012-09-19 NOTE — Telephone Encounter (Signed)
Patient Information:  Caller Name: [name]  Phone: [phone]  Patient: Robert Richard, Robert Richard  Gender: Male  DOB: 08-Feb-1931  Age: 79 Years  PCP: Marga Melnick  Office Follow Up:  Does the office need to follow up with this patient?: Yes  Instructions For The Office: Call back needed.  RN Note: Home care advice given for localized rash.   Feels anxious/jittery for 1-2 hours after taking Diltiazem. Anxiety not present now.  Triaged for localized rash and anxiety.  Office was unable to schedule post hospital appointment until 10/08/12.  Information noted and sent to MD for call back within 24 hours for new or worsening symptoms and taking prescriptions as prescribed per Anxiety guideline.  Symptoms  Reason For Call & Symptoms: Admitte 7/28-7/29/14 for syncope.  Called regarding pink, itchy, macular rash on buttocks, mailnly the right buttocks since began Diltiazem 180 mg daily. Also feels "jittery" right after takes medicatin in the morning.  Reviewed Health History In EMR: Yes  Reviewed Medications In EMR: Yes  Reviewed Allergies In EMR: Yes  Reviewed Surgeries / Procedures: Yes  Date of Onset of Symptoms: 09/17/2012  Guideline(s) Used:  Rash or Redness - Localized  Disposition Per Guideline:   Home Care  Reason For Disposition Reached:   Mild localized rash  Advice Given:  Avoid Soap:  Wash the area once thoroughly with soap to remove any remaining irritants. Thereafter avoid soaps to this area. Cleanse the area when needed with warm water.  Apply Cold to the Area:  Apply or soak in cold water for 20 minutes every 3 to 4 hours to reduce itching or pain.  Hydrocortisone Cream for Itching:   Keep the cream in the refrigerator (Reason: it feels better if applied cold).  Available over-the-counter in [place] as 0.5% and 1% cream.  CAUTION: Do not use hydrocortisone cream on suspected athlete's foot, jock itch, ringworm, or impetigo.  Avoid Scratching:  Try not to scratch. Cut  your fingernails short.  Contagiousness:  Adults with localized rashes do not need to miss any work or school.  Expected Course:  Most of these rashes pass in 2 to 3 days.  Call Back If:  Rash spreads or becomes worse  Rash lasts longer than 1 week  You become worse.  RN Overrode Recommendation:  Follow Up With Office Later  Call back needed; concerned new Rx is causing localized rash and brief anxiety after taking Diltiazem dose each morning.

## 2012-09-19 NOTE — Telephone Encounter (Signed)
Diltiazem should help prevent palpitations or jitteriness.Avoid stimulants such as decongestants, diet pills, nicotine, or caffeine (coffee, tea, cola, or chocolate) to excess.

## 2012-09-22 ENCOUNTER — Ambulatory Visit (INDEPENDENT_AMBULATORY_CARE_PROVIDER_SITE_OTHER): Payer: Medicare Other | Admitting: Internal Medicine

## 2012-09-22 ENCOUNTER — Encounter: Payer: Self-pay | Admitting: Internal Medicine

## 2012-09-22 VITALS — BP 138/72 | HR 50 | Temp 97.5°F | Wt 150.0 lb

## 2012-09-22 DIAGNOSIS — I739 Peripheral vascular disease, unspecified: Secondary | ICD-10-CM

## 2012-09-22 DIAGNOSIS — Z9889 Other specified postprocedural states: Secondary | ICD-10-CM | POA: Insufficient documentation

## 2012-09-22 DIAGNOSIS — N189 Chronic kidney disease, unspecified: Secondary | ICD-10-CM | POA: Insufficient documentation

## 2012-09-22 DIAGNOSIS — R55 Syncope and collapse: Secondary | ICD-10-CM

## 2012-09-22 DIAGNOSIS — N289 Disorder of kidney and ureter, unspecified: Secondary | ICD-10-CM

## 2012-09-22 DIAGNOSIS — I4891 Unspecified atrial fibrillation: Secondary | ICD-10-CM

## 2012-09-22 NOTE — Progress Notes (Signed)
  Subjective:    Patient ID: Robert Richard, male    DOB: [birth year], 79 y.o.   MRN: [id number]  HPI   The hospital record 7/28-7/29/14 were reviewed in the problem list updated. He experienced micturition syncope and was found to be in atrial fibrillation the emergency room. With calcium channel blockers there was rapid control of the rate and was discharged on extended release diltiazem 180 mg daily. Xarelto prescribed approximately 2 months ago because of prior history of deep venous thrombosis for which he had an embolectomy 2012 was continued.  He's had no recurrence of syncope but has had some intermittent dizziness. This was described walking and today.    Review of Systems He denies chest pain, palpitations, or shortness of breath.  His creatinine was 1.40, BUN 28, and GFR 46. He typically drinks 4 pints of water a day.     Objective:   Physical Exam Gen.: Thin but healthy and well-nourished in appearance. Alert, appropriate and cooperative throughout exam.Appears younger than stated age  Head: Normocephalic without obvious abnormalities;  no alopecia  Eyes: No corneal or conjunctival inflammation noted. Extraocular motion intact. No nystagmus Nose: External nasal exam reveals no deformity or inflammation. Nasal mucosa are pink and moist. No lesions or exudates noted.   Mouth: Oral mucosa and oropharynx reveal no lesions or exudates. Teeth in good repair. Neck: No deformities, masses, or tenderness noted. Range of motion decreased. Lungs: Normal respiratory effort; chest expands symmetrically. Lungs are clear to auscultation without rales, wheezes, or increased work of breathing. Increased AP diameter; decreased BS Heart: Normal rate and rhythm. Normal S1 and S2. No gallop, click, or rub. R base systolic murmur with R carotid radiation.                               Musculoskeletal/extremities: Accentuated curvature of  thoracic  Spine.  No clubbing, cyanosis, edema, or significant  extremity  deformity noted. Range of motion normal .Tone & strength  Normal. Joints essentially.Fingernail health good. Able to lie down & sit up w/o help. Negative SLR bilaterally Vascular: Carotid, radial artery, dorsalis pedis and  posterior tibial pulses are full and equal. No  L carotid bruit present. Neurologic: Alert and oriented x3. Deep tendon reflexes symmetrical and normal.         Skin: Intact without suspicious lesions or rashes. Lymph: No cervical, axillary lymphadenopathy present. Psych: Mood and affect are normal. Normally interactive                                                                                        Assessment & Plan:  See Current Assessment & Plan in Problem List under specific Diagnosis

## 2012-09-22 NOTE — Assessment & Plan Note (Signed)
He'll be asked to limit fluids after the evening meal. Isometrics are recommended when getting out of bed at night. He should avoid standing and straining @ urination.

## 2012-09-22 NOTE — Patient Instructions (Addendum)
Repeat the isometric exercises discussed 4- 5 times prior to standing if you've been in bed or seated for a period of time. Decrease fluid intake after evening meal. BUN, creatinine, and GFR (see labs)  all assess kidney function. To protect the kidneys it  is important to control your blood pressure and sugar. You should also stay well hydrated. Drink to thirst, up to 32 ounces of fluids per day.

## 2012-09-22 NOTE — Assessment & Plan Note (Signed)
Avoid non steroidals ; Tylenol OK

## 2012-09-22 NOTE — Telephone Encounter (Signed)
Correction: patient will be seen at 11:30, not 4:30

## 2012-09-30 ENCOUNTER — Telehealth: Payer: Self-pay | Admitting: Physician Assistant

## 2012-09-30 NOTE — Telephone Encounter (Signed)
Pt wife called regarding Xarelto.  Pt had been on it without difficulty, but was on samples and has not had to buy it yet. He is down to his last pill. She called it in and went to pick it up, but the pharmacy told her his insurance would not cover it. She did not get it and will not be able to afford it if insurance will not cover it.   Advised her the office might be able to fix that tomorrow but the insurance company and our office are closed and I cannot look into it further right now. Advised her I would send word to the office regarding this.   She would appreciate a call tomorrow about this.

## 2012-10-01 ENCOUNTER — Ambulatory Visit: Payer: Medicare Other | Admitting: Internal Medicine

## 2012-10-01 ENCOUNTER — Telehealth: Payer: Self-pay | Admitting: *Deleted

## 2012-10-01 ENCOUNTER — Telehealth: Payer: Self-pay | Admitting: Internal Medicine

## 2012-10-01 NOTE — Telephone Encounter (Signed)
I did pre authorization for xarelto, it was approved for patient, confirmed with [facility], informed patient to continue xarelto as directed. Patient has not missed any doses of xarelto.

## 2012-10-01 NOTE — Telephone Encounter (Signed)
Carmela Hurt, RN at 10/01/2012 10:37 AM   Status: Signed            I did pre authorization for xarelto, it was approved for patient, confirmed with [facility], informed patient to continue xarelto as directed. Patient has not missed any doses of xarelto.

## 2012-10-01 NOTE — Telephone Encounter (Signed)
Noted pt apt was cancelled, RB made aware that the pt actually needed a PA for the xarelto, and Addison Lank set the PA up for the pt per he no longer wanted to be on coumadin, pt Xarelto approved and pt will pick up today

## 2012-10-01 NOTE — Telephone Encounter (Signed)
New problem      Per Robert Loser PA calling    Need to get insurance company to pay for his xarelto.  Please advise .

## 2012-10-01 NOTE — Telephone Encounter (Signed)
Message copied by Ovidio Kin on Wed Oct 01, 2012  8:29 AM ------      Message from: Dietrich Pates V      Created: Tue Sep 30, 2012 10:43 PM       Patient had been on coumadin  I think he will need to resume since Xarelto is not covered.  Please set back up in coumadin clinic.      Start tomorrow (8/13)      ----- Message -----         From: Darrol Jump, PA-C         Sent: 09/30/2012   7:21 PM           To: Starr Sinclair, MD, #            Pt wife called regarding Xarelto. He is down to his last pill and ins co will not cover it. Please see phone note and call her today regarding her options.            I'm sending this to several people, trying to make sure the right person sees it.             Thanks,      Bjorn Loser       ------

## 2012-10-01 NOTE — Telephone Encounter (Signed)
Called [place] office and scheduled pt to be seen in the coumadin clinic today at 3:30pm per coumadin nurse is off in [place] office as well as [place] office today, pt has accepted the apt today and will be evaluated by the coumadin nurse to assess per xarelto not covered.

## 2012-10-08 ENCOUNTER — Encounter: Payer: Medicare Other | Admitting: Physician Assistant

## 2012-10-31 ENCOUNTER — Other Ambulatory Visit: Payer: Self-pay | Admitting: Internal Medicine

## 2012-10-31 ENCOUNTER — Other Ambulatory Visit: Payer: Self-pay | Admitting: General Practice

## 2012-10-31 MED ORDER — PRAVASTATIN SODIUM 40 MG PO TABS: ORAL_TABLET | ORAL | Status: DC

## 2012-10-31 NOTE — Telephone Encounter (Signed)
Med filled recent labs completed in hospital in July and pt had an appt with hopp in Aug.

## 2012-11-11 ENCOUNTER — Other Ambulatory Visit: Payer: Self-pay | Admitting: Internal Medicine

## 2012-11-12 NOTE — Telephone Encounter (Signed)
Med filled.  

## 2012-11-12 NOTE — Telephone Encounter (Signed)
Last OV 09-22-2012 Med filled 05-07-12 #90 with 1

## 2012-11-12 NOTE — Telephone Encounter (Signed)
Ok x 1

## 2012-11-13 ENCOUNTER — Ambulatory Visit (INDEPENDENT_AMBULATORY_CARE_PROVIDER_SITE_OTHER): Payer: Medicare Other | Admitting: Internal Medicine

## 2012-11-13 ENCOUNTER — Encounter: Payer: Self-pay | Admitting: Internal Medicine

## 2012-11-13 VITALS — BP 120/70 | HR 69 | Ht 73.0 in | Wt 152.0 lb

## 2012-11-13 DIAGNOSIS — R5383 Other fatigue: Secondary | ICD-10-CM

## 2012-11-13 DIAGNOSIS — R0602 Shortness of breath: Secondary | ICD-10-CM

## 2012-11-13 DIAGNOSIS — R5381 Other malaise: Secondary | ICD-10-CM

## 2012-11-13 NOTE — Progress Notes (Signed)
HPI Patient is a 79 yo with a history of L popliteal artery embolus and mild mitral stenosis.  I saw him in clinic in Jan 2013  He also has a history of HTN and HL  No documented afib.   Echo in June 2013 confirmed mild mitral stenososi  He also has mild aortic stenosis.  LV function was normal. Patient denies CP  Does note some fatigue.   Allergies  Allergen Reactions  . Ivp Dye [Iodinated Diagnostic Agents] Hives    Current Outpatient Prescriptions  Medication Sig Dispense Refill  . AMBULATORY NON FORMULARY MEDICATION Medication Name: Legatrin PM 1 by mouth as needed for leg cramps or pain      . busPIRone (BUSPAR) 15 MG tablet TAKE ONE-HALF TABLET BY MOUTH TWICE DAILY  90 tablet  0  . Calcium Carbonate-Vit D-Min (CALTRATE 600+D PLUS PO) Take by mouth daily.       . cetirizine (ZYRTEC) 10 MG tablet Take 10 mg by mouth as needed for allergies.      . Cholecalciferol (VITAMIN D3) 2000 UNITS TABS Take by mouth daily.      Marland Kitchen diltiazem (CARDIZEM CD) 180 MG 24 hr capsule Take 1 capsule (180 mg total) by mouth daily.  30 capsule  2  . diphenhydramine-acetaminophen (TYLENOL PM) 25-500 MG TABS Take 1 tablet by mouth at bedtime as needed.      . ferrous sulfate 325 (65 FE) MG tablet Take 325 mg by mouth daily with breakfast.      . fluticasone (FLONASE) 50 MCG/ACT nasal spray Place 1 spray into the nose 2 (two) times daily as needed for rhinitis.  16 g  2  . ketotifen (ZADITOR) 0.025 % ophthalmic solution Place 1 drop into both eyes daily as needed.        . Lidocaine HCl 4 % SOLN Apply 4 mLs topically. Dr.Freeman, for Migraines      . Multiple Vitamin (MULTIVITAMIN) tablet Take 1 tablet by mouth daily.        . Omega-3 Fatty Acids (FISH OIL CONCENTRATE PO) Take 1,400 mg by mouth.        . polyethylene glycol powder (MIRALAX) powder Take 17 g by mouth daily.      . pravastatin (PRAVACHOL) 40 MG tablet 1 by mouth daily  90 tablet  0  . ranitidine (ZANTAC) 150 MG tablet TAKE ONE TABLET TWICE DAILY   180 tablet  0  . Rivaroxaban (XARELTO) 20 MG TABS Take 1 tablet (20 mg total) by mouth daily with supper.  30 tablet  6  . SF 5000 PLUS 1.1 % CREA dental cream As directed      . sildenafil (VIAGRA) 100 MG tablet 1/2-1 qd prn  5 tablet  5  . Specialty Vitamins Products (MAGNESIUM, AMINO ACID CHELATE,) 133 MG tablet Take 1 tablet by mouth daily.        Marland Kitchen topiramate (TOPAMAX) 25 MG capsule Take 25 mg by mouth daily. RXed by Dr.Freeman       No current facility-administered medications for this visit.    Past Medical History  Diagnosis Date  . Anemia   . Colon polyp   . COPD (chronic obstructive pulmonary disease)     Astmatic bronchitis  . Asthma with bronchitis   . Gout     PMH  . Osteoporosis   . Allergic rhinitis   . Skin cancer, basal cell     Dr Margo Aye, PMH  . Nephrolithiasis   . HLD (hyperlipidemia)   .  Skin cancer (melanoma) 2012    Right neck  . Dysrhythmia 08/2012    NEW ONSET ATRIAL FIBRILATION  . Arthritis     SHOULDER    Past Surgical History  Procedure Laterality Date  . Finger surgery      for foreign body  . Colonoscopy w/ polypectomy      Dr Arlyce Dice  . Inguinal hernia repair    . Nose surgery      Septal Deviation  . Neck surgery  12/2010    Melanoma ; [facility]   . Leg surgery  06/2010     Dr.Lawson, for blood clott. Left leg     Family History  Problem Relation Age of Onset  . Allergies Mother   . Coronary artery disease Mother   . Allergy (severe) Mother   . Allergies Father   . Stroke Father   . Heart attack Father 56  . Asthma Father   . Allergy (severe) Father   . Allergies Sister   . Allergy (severe) Sister   . Allergies Brother     x2  . Allergy (severe) Brother   . Diabetes Brother   . Allergy (severe) Brother   . COPD Neg Hx   . Cancer Son     History   Social History  . Marital Status: Married    Spouse Name: N/A    Number of Children: N/A  . Years of Education: N/A   Occupational History  . retired Therapist, music  [employer]   Social History Main Topics  . Smoking status: Former Smoker -- 3.00 packs/day    Types: Cigarettes    Quit date: 02/19/1961  . Smokeless tobacco: Never Used     Comment: started at age 42  . Alcohol Use: No  . Drug Use: No  . Sexual Activity: Not on file   Other Topics Concern  . Not on file   Social History Narrative  . No narrative on file    Review of Systems:  All systems reviewed.  They are negative to the above problem except as previously stated.  Vital Signs: BP 120/70  Pulse 69  Ht 6\' 1"  (1.854 m)  Wt 152 lb (68.947 kg)  BMI 20.06 kg/m2  Physical Exam Patient is in NAD HEENT:  Normocephalic, atraumatic. EOMI, PERRLA.  Neck: JVP is normal. Murmur radiating to neck Lungs: clear to auscultation. No rales no wheezes.  Heart: Regular rate and rhythm. Normal S1, S2. No S3  Gr III/Vi systolic murmur LSB to Base and apex  No diastolic murmurs audiuble PMI not displaced.  Abdomen:  Supple, nontender. Normal bowel sounds. No masses. No hepatomegaly.  Extremities:   Good distal pulses throughout. No lower extremity edema.  Musculoskeletal :moving all extremities.  Neuro:   alert and oriented x3.  CN II-XII grossly intact.   Assessment and Plan:  1.  SOB/fatigue  Will set up for [employer]   2.  Popliteal embolus  Patient should be on lifelong anticoag.   3.  Valve dz.    Echo in June with mild AS and MS Follow  Periodic echoes.  4.  HL  Good control in November.

## 2012-11-13 NOTE — Patient Instructions (Signed)
Your physician recommends that you continue on your current medications as directed. Please refer to the Current Medication list given to you today. Your physician has requested that you have a lexiscan myoview. For further information please visit [email or web address]. Please follow instruction sheet, as given.  

## 2012-11-20 ENCOUNTER — Ambulatory Visit (INDEPENDENT_AMBULATORY_CARE_PROVIDER_SITE_OTHER): Payer: Medicare Other

## 2012-11-20 DIAGNOSIS — Z23 Encounter for immunization: Secondary | ICD-10-CM

## 2012-12-01 ENCOUNTER — Other Ambulatory Visit: Payer: Self-pay | Admitting: *Deleted

## 2012-12-01 ENCOUNTER — Encounter: Payer: Self-pay | Admitting: *Deleted

## 2012-12-01 DIAGNOSIS — Z7901 Long term (current) use of anticoagulants: Secondary | ICD-10-CM

## 2012-12-01 DIAGNOSIS — I749 Embolism and thrombosis of unspecified artery: Secondary | ICD-10-CM

## 2012-12-02 ENCOUNTER — Ambulatory Visit (HOSPITAL_COMMUNITY): Payer: Medicare Other | Attending: Internal Medicine | Admitting: Radiology

## 2012-12-02 VITALS — BP 135/68 | Ht 73.0 in | Wt 150.0 lb

## 2012-12-02 DIAGNOSIS — J4489 Other specified chronic obstructive pulmonary disease: Secondary | ICD-10-CM | POA: Insufficient documentation

## 2012-12-02 DIAGNOSIS — R5383 Other fatigue: Secondary | ICD-10-CM

## 2012-12-02 DIAGNOSIS — R9431 Abnormal electrocardiogram [ECG] [EKG]: Secondary | ICD-10-CM

## 2012-12-02 DIAGNOSIS — I10 Essential (primary) hypertension: Secondary | ICD-10-CM | POA: Insufficient documentation

## 2012-12-02 DIAGNOSIS — R0602 Shortness of breath: Secondary | ICD-10-CM

## 2012-12-02 DIAGNOSIS — Z87891 Personal history of nicotine dependence: Secondary | ICD-10-CM | POA: Insufficient documentation

## 2012-12-02 DIAGNOSIS — I6529 Occlusion and stenosis of unspecified carotid artery: Secondary | ICD-10-CM | POA: Insufficient documentation

## 2012-12-02 DIAGNOSIS — J449 Chronic obstructive pulmonary disease, unspecified: Secondary | ICD-10-CM | POA: Insufficient documentation

## 2012-12-02 DIAGNOSIS — R5381 Other malaise: Secondary | ICD-10-CM | POA: Insufficient documentation

## 2012-12-02 MED ORDER — TECHNETIUM TC 99M SESTAMIBI GENERIC - CARDIOLITE
30.0000 | Freq: Once | INTRAVENOUS | Status: AC | PRN
  Administered 2012-12-02: 30 via INTRAVENOUS

## 2012-12-02 MED ORDER — TECHNETIUM TC 99M SESTAMIBI GENERIC - CARDIOLITE
10.0000 | Freq: Once | INTRAVENOUS | Status: AC | PRN
  Administered 2012-12-02: 10 via INTRAVENOUS

## 2012-12-02 MED ORDER — REGADENOSON 0.4 MG/5ML IV SOLN
0.4000 mg | Freq: Once | INTRAVENOUS | Status: AC
  Administered 2012-12-02: 0.4 mg via INTRAVENOUS

## 2012-12-02 NOTE — Progress Notes (Signed)
[facility] SITE 3 NUCLEAR MED [address] [place], [place] [address] [phone]    Cardiology Nuclear Med Study  Robert Richard is a 79 y.o. male     MRN : [id number]     DOB: Feb 18, 1931  Procedure Date: 12/02/2012  Nuclear Med Background Indication for Stress Test:  Evaluation for Ischemia History:  Echo EF 65-70%, COPD, 2006 MPS EF 71%, Normal Cardiac Risk Factors: Carotid Disease, Family History - CAD, History of Smoking, Hypertension and Lipids  Symptoms:  Fatigue and SOB   Nuclear Pre-Procedure Caffeine/Decaff Intake:  None NPO After: 7:00pm   Lungs:  clear O2 Sat: 97% on room air. IV 0.9% NS with Angio Cath:  22g  IV Site: R Antecubital  IV Started by:  Bonnita Levan, RN  Chest Size (in):  42 Cup Size: n/a  Height: 6\' 1"  (1.854 m)  Weight:  150 lb (68.04 kg)  BMI:  Body mass index is 19.79 kg/(m^2). Tech Comments:  N/A    Nuclear Med Study 1 or 2 day study: 1 day  Stress Test Type:  Lexiscan  Reading MD: Charlton Haws, MD  Order Authorizing Provider:  Dietrich Pates, MD  Resting Radionuclide: Technetium 25m Sestamibi  Resting Radionuclide Dose: 11.0 mCi   Stress Radionuclide:  Technetium 21m Sestamibi  Stress Radionuclide Dose: 33.0 mCi           Stress Protocol Rest HR: 58 Stress HR: 81  Rest BP: 135/68 Stress BP: 137/76  Exercise Time (min): n/a METS: n/a   Predicted Max HR: 140 bpm % Max HR: 57.86 bpm Rate Pressure Product: 46962   Dose of Adenosine (mg):  n/a Dose of Lexiscan: 0.4 mg  Dose of Atropine (mg): n/a Dose of Dobutamine: n/a mcg/kg/min (at max HR)  Stress Test Technologist: Milana Na, EMT-P  Nuclear Technologist:  Domenic Polite, CNMT     Rest Procedure:  Myocardial perfusion imaging was performed at rest 45 minutes following the intravenous administration of Technetium 66m Sestamibi. Rest ECG: NSR with non-specific ST-T wave changes and NSR-LVH  Stress Procedure:  The patient received IV Lexiscan 0.4 mg over 15-seconds.   Technetium 50m Sestamibi injected at 30-seconds.  This patient had sob with the Lexiscan injection. Quantitative spect images were obtained after a 45 minute delay. Stress ECG: No significant change from baseline ECG  QPS Raw Data Images:  Normal; no motion artifact; normal heart/lung ratio. Stress Images:  There is decreased uptake in the inferior wall. Rest Images:  There is decreased uptake in the inferior wall. Subtraction (SDS):  There is a fixed defect that is most consistent with a previous infarction. Transient Ischemic Dilatation (Normal <1.22):  n/a Lung/Heart Ratio (Normal <0.45):  0.37  Quantitative Gated Spect Images QGS EDV:  n/a ml QGS ESV:  n/a ml  Impression Exercise Capacity:  Lexiscan with no exercise. BP Response:  Normal blood pressure response. Clinical Symptoms:  There is dyspnea. ECG Impression:  No significant ST segment change suggestive of ischemia. Comparison with Prior Nuclear Study: No images to compare  Overall Impression:  Low risk stress nuclear study Moderate inferolateral wall infarct from apex to base Study not gated .  LV Ejection Fraction: n/a.  LV Wall Motion:  Not gated   Charlton Haws

## 2012-12-15 ENCOUNTER — Other Ambulatory Visit: Payer: Self-pay | Admitting: *Deleted

## 2012-12-15 MED ORDER — DILTIAZEM HCL ER COATED BEADS 180 MG PO CP24: 180.0000 mg | ORAL_CAPSULE | Freq: Every day | ORAL | Status: DC

## 2012-12-15 NOTE — Telephone Encounter (Signed)
Diltiazem refill sent to pharmacy

## 2012-12-18 ENCOUNTER — Telehealth: Payer: Self-pay | Admitting: *Deleted

## 2012-12-18 DIAGNOSIS — I4891 Unspecified atrial fibrillation: Secondary | ICD-10-CM

## 2012-12-18 NOTE — Telephone Encounter (Signed)
Message copied by Barrie Folk on Thu Dec 18, 2012  2:08 PM ------      Message from: Dietrich Pates V      Created: Thu Dec 18, 2012  1:45 PM       Myoview appears to be somewhat different than stress test several years ago.        He had echo this summer        I want him to get a limited echo just to relook at LV EF  And to see how inferior wall is moving. ------

## 2012-12-18 NOTE — Telephone Encounter (Signed)
I called and spoke with pt wife, [name], today about stress test results. She is aware Dr. Tenny Craw would like pt to come in for a limited echo. Scheduled for 12/22/12 Wife & pt would like to have this done at the [facility]. Office

## 2012-12-22 ENCOUNTER — Ambulatory Visit (HOSPITAL_COMMUNITY): Payer: Medicare Other | Attending: Cardiology

## 2012-12-22 DIAGNOSIS — I059 Rheumatic mitral valve disease, unspecified: Secondary | ICD-10-CM | POA: Insufficient documentation

## 2012-12-22 DIAGNOSIS — I10 Essential (primary) hypertension: Secondary | ICD-10-CM | POA: Insufficient documentation

## 2012-12-22 DIAGNOSIS — E785 Hyperlipidemia, unspecified: Secondary | ICD-10-CM | POA: Insufficient documentation

## 2012-12-22 DIAGNOSIS — R0609 Other forms of dyspnea: Secondary | ICD-10-CM

## 2012-12-22 DIAGNOSIS — R0989 Other specified symptoms and signs involving the circulatory and respiratory systems: Secondary | ICD-10-CM

## 2012-12-22 DIAGNOSIS — I4891 Unspecified atrial fibrillation: Secondary | ICD-10-CM

## 2012-12-22 DIAGNOSIS — I359 Nonrheumatic aortic valve disorder, unspecified: Secondary | ICD-10-CM | POA: Insufficient documentation

## 2012-12-22 DIAGNOSIS — Z87891 Personal history of nicotine dependence: Secondary | ICD-10-CM | POA: Insufficient documentation

## 2012-12-22 DIAGNOSIS — J449 Chronic obstructive pulmonary disease, unspecified: Secondary | ICD-10-CM | POA: Insufficient documentation

## 2012-12-22 DIAGNOSIS — R0602 Shortness of breath: Secondary | ICD-10-CM

## 2012-12-22 DIAGNOSIS — J4489 Other specified chronic obstructive pulmonary disease: Secondary | ICD-10-CM | POA: Insufficient documentation

## 2012-12-22 NOTE — Progress Notes (Signed)
Echocardiogram performed.  

## 2012-12-30 ENCOUNTER — Telehealth: Payer: Self-pay | Admitting: Internal Medicine

## 2012-12-30 DIAGNOSIS — R5383 Other fatigue: Secondary | ICD-10-CM

## 2012-12-30 DIAGNOSIS — I4891 Unspecified atrial fibrillation: Secondary | ICD-10-CM

## 2012-12-30 DIAGNOSIS — R0602 Shortness of breath: Secondary | ICD-10-CM

## 2012-12-30 NOTE — Telephone Encounter (Signed)
New message ° ° ° ° ° °Want echo results °

## 2012-12-30 NOTE — Telephone Encounter (Signed)
Spoke with Robert Richard, aware echo and LV function is normal. She wanted to know if dr Tenny Craw had an idea of why he was so fatigued. She reports he has no chest pain or SOB but he has no energy to do anything. He will say his legs just give out. Aware dr Tenny Craw not here until the end of the week but will forward for her review. Pt agreed with this plan.

## 2012-12-31 ENCOUNTER — Other Ambulatory Visit (INDEPENDENT_AMBULATORY_CARE_PROVIDER_SITE_OTHER): Payer: Medicare Other

## 2012-12-31 ENCOUNTER — Encounter: Payer: Self-pay | Admitting: *Deleted

## 2012-12-31 DIAGNOSIS — R5383 Other fatigue: Secondary | ICD-10-CM

## 2012-12-31 DIAGNOSIS — I4891 Unspecified atrial fibrillation: Secondary | ICD-10-CM

## 2012-12-31 DIAGNOSIS — R0602 Shortness of breath: Secondary | ICD-10-CM

## 2012-12-31 DIAGNOSIS — R5381 Other malaise: Secondary | ICD-10-CM

## 2012-12-31 LAB — BASIC METABOLIC PANEL
BUN: 21 mg/dL (ref 6–23)
CO2: 22 mEq/L (ref 19–32)
Calcium: 9.8 mg/dL (ref 8.4–10.5)
Chloride: 110 mEq/L (ref 96–112)
Creatinine, Ser: 1.5 mg/dL (ref 0.4–1.5)
GFR: 48.53 mL/min — ABNORMAL LOW (ref >60.00)
Glucose, Bld: 130 mg/dL — ABNORMAL HIGH (ref 70–99)
Potassium: 4.2 mEq/L (ref 3.5–5.1)
Sodium: 140 mEq/L (ref 135–145)

## 2012-12-31 LAB — CBC WITH DIFFERENTIAL/PLATELET
Basophils Absolute: 0 10*3/uL (ref 0.0–0.1)
Basophils Relative: 0.7 % (ref 0.0–3.0)
Eosinophils Absolute: 0.1 10*3/uL (ref 0.0–0.7)
Eosinophils Relative: 2.2 % (ref 0.0–5.0)
HCT: 32.5 % — ABNORMAL LOW (ref 39.0–52.0)
Hemoglobin: 11.1 g/dL — ABNORMAL LOW (ref 13.0–17.0)
Lymphocytes Relative: 28.9 % (ref 12.0–46.0)
Lymphs Abs: 2 10*3/uL (ref 0.7–4.0)
MCHC: 34.2 g/dL (ref 30.0–36.0)
MCV: 85.4 fl (ref 78.0–100.0)
Monocytes Absolute: 0.9 10*3/uL (ref 0.1–1.0)
Monocytes Relative: 13.5 % — ABNORMAL HIGH (ref 3.0–12.0)
Neutro Abs: 3.7 10*3/uL (ref 1.4–7.7)
Neutrophils Relative %: 54.7 % (ref 43.0–77.0)
Platelets: 205 10*3/uL (ref 150.0–400.0)
RBC: 3.8 Mil/uL — ABNORMAL LOW (ref 4.22–5.81)
RDW: 13.9 % (ref 11.5–14.6)
WBC: 6.8 10*3/uL (ref 4.5–10.5)

## 2012-12-31 LAB — PROTIME-INR
INR: 1.4 ratio — ABNORMAL HIGH (ref 0.8–1.0)
Prothrombin Time: 15.1 s — ABNORMAL HIGH (ref 10.2–12.4)

## 2012-12-31 MED ORDER — DIPHENHYDRAMINE HCL 25 MG PO CAPS: ORAL_CAPSULE | ORAL | Status: DC

## 2012-12-31 MED ORDER — FAMOTIDINE 20 MG PO TABS: ORAL_TABLET | ORAL | Status: DC

## 2012-12-31 MED ORDER — PREDNISONE 20 MG PO TABS: ORAL_TABLET | ORAL | Status: DC

## 2012-12-31 NOTE — Telephone Encounter (Signed)
Spoke to patient yesterday  Needs to be set up for L heart cath Needs precath labs  Will come in today

## 2012-12-31 NOTE — Telephone Encounter (Signed)
Spoke w/Pt and his wife.  He will come in today for pre cath labs.  Cath scheduled for 11/14 with Dr. Excell Seltzer @ 10:30.  Instructions to be given when pt comes in for labs. Does have Dye allergy.  Will give medications as ordered by Dr. Excell Seltzer

## 2013-01-01 ENCOUNTER — Encounter (HOSPITAL_COMMUNITY): Payer: Self-pay | Admitting: Respiratory Therapy

## 2013-01-02 ENCOUNTER — Encounter (HOSPITAL_COMMUNITY)
Admission: RE | Disposition: A | Discharge status: Home / Self Care | Payer: Self-pay | Source: Ambulatory Visit | Attending: Cardiovascular Disease

## 2013-01-02 ENCOUNTER — Telehealth: Payer: Self-pay | Admitting: *Deleted

## 2013-01-02 ENCOUNTER — Ambulatory Visit (HOSPITAL_COMMUNITY)
Admission: RE | Admit: 2013-01-02 | Discharge: 2013-01-02 | Disposition: A | Discharge status: Home / Self Care | Payer: Medicare Other | Source: Ambulatory Visit | Attending: Cardiovascular Disease | Admitting: Cardiovascular Disease

## 2013-01-02 DIAGNOSIS — R5383 Other fatigue: Secondary | ICD-10-CM

## 2013-01-02 DIAGNOSIS — J45902 Unspecified asthma with status asthmaticus: Secondary | ICD-10-CM | POA: Insufficient documentation

## 2013-01-02 DIAGNOSIS — I4891 Unspecified atrial fibrillation: Secondary | ICD-10-CM | POA: Insufficient documentation

## 2013-01-02 DIAGNOSIS — Z538 Procedure and treatment not carried out for other reasons: Secondary | ICD-10-CM | POA: Insufficient documentation

## 2013-01-02 DIAGNOSIS — Z85820 Personal history of malignant melanoma of skin: Secondary | ICD-10-CM | POA: Insufficient documentation

## 2013-01-02 DIAGNOSIS — E785 Hyperlipidemia, unspecified: Secondary | ICD-10-CM | POA: Insufficient documentation

## 2013-01-02 DIAGNOSIS — I359 Nonrheumatic aortic valve disorder, unspecified: Secondary | ICD-10-CM | POA: Insufficient documentation

## 2013-01-02 DIAGNOSIS — M19019 Primary osteoarthritis, unspecified shoulder: Secondary | ICD-10-CM | POA: Insufficient documentation

## 2013-01-02 DIAGNOSIS — Z79899 Other long term (current) drug therapy: Secondary | ICD-10-CM | POA: Insufficient documentation

## 2013-01-02 DIAGNOSIS — I10 Essential (primary) hypertension: Secondary | ICD-10-CM | POA: Insufficient documentation

## 2013-01-02 DIAGNOSIS — R0602 Shortness of breath: Secondary | ICD-10-CM

## 2013-01-02 DIAGNOSIS — R9431 Abnormal electrocardiogram [ECG] [EKG]: Secondary | ICD-10-CM | POA: Insufficient documentation

## 2013-01-02 DIAGNOSIS — J449 Chronic obstructive pulmonary disease, unspecified: Secondary | ICD-10-CM | POA: Insufficient documentation

## 2013-01-02 DIAGNOSIS — I059 Rheumatic mitral valve disease, unspecified: Secondary | ICD-10-CM | POA: Insufficient documentation

## 2013-01-02 DIAGNOSIS — Z1151 Encounter for screening for human papillomavirus (HPV): Secondary | ICD-10-CM | POA: Insufficient documentation

## 2013-01-02 LAB — GLUCOSE, CAPILLARY: Glucose-Capillary: 159 mg/dL — ABNORMAL HIGH (ref 70–99)

## 2013-01-02 SURGERY — LEFT HEART CATHETERIZATION WITH CORONARY ANGIOGRAM
Anesthesia: LOCAL

## 2013-01-02 MED ORDER — ASPIRIN 81 MG PO CHEW: 81.0000 mg | CHEWABLE_TABLET | ORAL | Status: DC

## 2013-01-02 MED ORDER — FAMOTIDINE 20 MG PO TABS: 20.0000 mg | ORAL_TABLET | ORAL | Status: DC

## 2013-01-02 MED ORDER — DIPHENHYDRAMINE HCL 25 MG PO CAPS: ORAL_CAPSULE | ORAL | Status: DC

## 2013-01-02 MED ORDER — FAMOTIDINE IN NACL 20-0.9 MG/50ML-% IV SOLN: 20.0000 mg | Freq: Once | INTRAVENOUS | Status: DC

## 2013-01-02 MED ORDER — SODIUM CHLORIDE 0.9 % IV SOLN
INTRAVENOUS | Status: DC
  Administered 2013-01-02: 09:00:00 via INTRAVENOUS

## 2013-01-02 MED ORDER — METHYLPREDNISOLONE SODIUM SUCC 125 MG IJ SOLR: 80.0000 mg | Freq: Once | INTRAMUSCULAR | Status: DC

## 2013-01-02 MED ORDER — DIPHENHYDRAMINE HCL 50 MG/ML IJ SOLN: 25.0000 mg | Freq: Once | INTRAMUSCULAR | Status: DC

## 2013-01-02 MED ORDER — SODIUM CHLORIDE 0.9 % IJ SOLN: 3.0000 mL | INTRAMUSCULAR | Status: DC | PRN

## 2013-01-02 MED ORDER — SODIUM CHLORIDE 0.9 % IJ SOLN: 3.0000 mL | Freq: Two times a day (BID) | INTRAMUSCULAR | Status: DC

## 2013-01-02 MED ORDER — SODIUM CHLORIDE 0.9 % IV SOLN: 250.0000 mL | INTRAVENOUS | Status: DC | PRN

## 2013-01-02 MED ORDER — PREDNISONE 20 MG PO TABS: ORAL_TABLET | ORAL | Status: DC

## 2013-01-02 NOTE — Telephone Encounter (Signed)
Spoke with pt, pt was scheduled for a cath this am and took his xarelto, therefore his case was canceled. He was rescheduled for Monday 01-05-13 @ 9am with dr cooper. Instructions discussed over the phone. He will not take xarelto Saturday, Sunday or Monday. He will follow the pre-med guidelines on his instruction sheet. New scripts sent to the pharm. Patient voiced understanding

## 2013-01-02 NOTE — Progress Notes (Signed)
Patient was canceled due to taking Xarelto dose 01/01/13.  Office to reschedule patient.

## 2013-01-02 NOTE — Progress Notes (Signed)
HPI  Patient is a 79 yo with a history of L popliteal artery embolus and mild mitral stenosis. I saw him in clinic in Jan 2013 He also has a history of HTN and HL No documented afib.  Echo in June 2013 confirmed mild mitral stenososi He also has mild aortic stenosis. LV function was normal.  Patient denies CP Does note some fatigue.  Allergies   Allergen  Reactions   .  Ivp Dye [Iodinated Diagnostic Agents]  Hives    Current Outpatient Prescriptions   Medication  Sig  Dispense  Refill   .  AMBULATORY NON FORMULARY MEDICATION  Medication Name: Legatrin PM 1 by mouth as needed for leg cramps or pain     .  busPIRone (BUSPAR) 15 MG tablet  TAKE ONE-HALF TABLET BY MOUTH TWICE DAILY  90 tablet  0   .  Calcium Carbonate-Vit D-Min (CALTRATE 600+D PLUS PO)  Take by mouth daily.     .  cetirizine (ZYRTEC) 10 MG tablet  Take 10 mg by mouth as needed for allergies.     .  Cholecalciferol (VITAMIN D3) 2000 UNITS TABS  Take by mouth daily.     .  diltiazem (CARDIZEM CD) 180 MG 24 hr capsule  Take 1 capsule (180 mg total) by mouth daily.  30 capsule  2   .  diphenhydramine-acetaminophen (TYLENOL PM) 25-500 MG TABS  Take 1 tablet by mouth at bedtime as needed.     .  ferrous sulfate 325 (65 FE) MG tablet  Take 325 mg by mouth daily with breakfast.     .  fluticasone (FLONASE) 50 MCG/ACT nasal spray  Place 1 spray into the nose 2 (two) times daily as needed for rhinitis.  16 g  2   .  ketotifen (ZADITOR) 0.025 % ophthalmic solution  Place 1 drop into both eyes daily as needed.     .  Lidocaine HCl 4 % SOLN  Apply 4 mLs topically. Dr.Freeman, for Migraines     .  Multiple Vitamin (MULTIVITAMIN) tablet  Take 1 tablet by mouth daily.     .  Omega-3 Fatty Acids (FISH OIL CONCENTRATE PO)  Take 1,400 mg by mouth.     .  polyethylene glycol powder (MIRALAX) powder  Take 17 g by mouth daily.     .  pravastatin (PRAVACHOL) 40 MG tablet  1 by mouth daily  90 tablet  0   .  ranitidine (ZANTAC) 150 MG tablet  TAKE ONE  TABLET TWICE DAILY  180 tablet  0   .  Rivaroxaban (XARELTO) 20 MG TABS  Take 1 tablet (20 mg total) by mouth daily with supper.  30 tablet  6   .  SF 5000 PLUS 1.1 % CREA dental cream  As directed     .  sildenafil (VIAGRA) 100 MG tablet  1/2-1 qd prn  5 tablet  5   .  Specialty Vitamins Products (MAGNESIUM, AMINO ACID CHELATE,) 133 MG tablet  Take 1 tablet by mouth daily.     .  topiramate (TOPAMAX) 25 MG capsule  Take 25 mg by mouth daily. RXed by Dr.Freeman      No current facility-administered medications for this visit.    Past Medical History   Diagnosis  Date   .  Anemia    .  Colon polyp    .  COPD (chronic obstructive pulmonary disease)      Astmatic bronchitis   .  Asthma with bronchitis    .    Gout      PMH   .  Osteoporosis    .  Allergic rhinitis    .  Skin cancer, basal cell      Dr Hall, PMH   .  Nephrolithiasis    .  HLD (hyperlipidemia)    .  Skin cancer (melanoma)  2012     Right neck   .  Dysrhythmia  08/2012     NEW ONSET ATRIAL FIBRILATION   .  Arthritis      SHOULDER    Past Surgical History   Procedure  Laterality  Date   .  Finger surgery       for foreign body   .  Colonoscopy w/ polypectomy       Dr Kaplan   .  Inguinal hernia repair     .  Nose surgery       Septal Deviation   .  Neck surgery   12/2010     Melanoma ; [facility]   .  Leg surgery   06/2010     Dr.Lawson, for blood clott. Left leg    Family History   Problem  Relation  Age of Onset   .  Allergies  Mother    .  Coronary artery disease  Mother    .  Allergy (severe)  Mother    .  Allergies  Father    .  Stroke  Father    .  Heart attack  Father  77   .  Asthma  Father    .  Allergy (severe)  Father    .  Allergies  Sister    .  Allergy (severe)  Sister    .  Allergies  Brother      x2   .  Allergy (severe)  Brother    .  Diabetes  Brother    .  Allergy (severe)  Brother    .  COPD  Neg Hx    .  Cancer  Son     History    Social History   .  Marital Status:   Married     Spouse Name:  N/A     Number of Children:  N/A   .  Years of Education:  N/A    Occupational History   .  retired  [employer]    Social History Main Topics   .  Smoking status:  Former Smoker -- 3.00 packs/day     Types:  Cigarettes     Quit date:  02/19/1961   .  Smokeless tobacco:  Never Used      Comment: started at age 18   .  Alcohol Use:  No   .  Drug Use:  No   .  Sexual Activity:  Not on file    Other Topics  Concern   .  Not on file    Social History Narrative   .  No narrative on file    Review of Systems: All systems reviewed. They are negative to the above problem except as previously stated.  Vital Signs:  BP 120/70  Pulse 69  Ht 6' 1" (1.854 m)  Wt 152 lb (68.947 kg)  BMI 20.06 kg/m2  Physical Exam  Patient is in NAD  HEENT: Normocephalic, atraumatic. EOMI, PERRLA.  Neck: JVP is normal. Murmur radiating to neck  Lungs: clear to auscultation. No rales no wheezes.  Heart: Regular rate and rhythm. Normal S1, S2. No   S3 Gr III/Vi systolic murmur LSB to Base and apex No diastolic murmurs audiuble PMI not displaced.  Abdomen: Supple, nontender. Normal bowel sounds. No masses. No hepatomegaly.  Extremities: Good distal pulses throughout. No lower extremity edema.  Musculoskeletal :moving all extremities.  Neuro: alert and oriented x3. CN II-XII grossly intact.  Assessment and Plan:  1. SOB/fatigue Will set up for Lexiscan myoview.  2. Popliteal embolus Patient should be on lifelong anticoag.  3. Valve dz. Echo in June with mild AS and MS Follow Periodic echoes.  4. HL Good control in November.   Addendum:  01/02/2013  Since he was seen on Sept 25, 2014 she had a lexiscan myoview that showed inferolateral infarct from apex to base.  Images not gated.  This was different from previuos scan which was normal He went on to have echo which showed normal LV systolic function and normal wall motion Patient continues to feel poorly  SOB Gives out  ?  If myoview with hypernating myocardium  Would recomm L heart cath to define anatomy  Patient agrees with plan.    Makisha Marrin 01/02/2013  

## 2013-01-05 ENCOUNTER — Encounter (HOSPITAL_COMMUNITY)
Admission: RE | Disposition: A | Discharge status: Home / Self Care | Payer: Self-pay | Source: Ambulatory Visit | Attending: Cardiovascular Disease

## 2013-01-05 ENCOUNTER — Ambulatory Visit (HOSPITAL_COMMUNITY)
Admission: RE | Admit: 2013-01-05 | Discharge: 2013-01-05 | Disposition: A | Discharge status: Home / Self Care | Payer: Medicare Other | Source: Ambulatory Visit | Attending: Cardiovascular Disease | Admitting: Cardiovascular Disease

## 2013-01-05 DIAGNOSIS — J449 Chronic obstructive pulmonary disease, unspecified: Secondary | ICD-10-CM | POA: Insufficient documentation

## 2013-01-05 DIAGNOSIS — I080 Rheumatic disorders of both mitral and aortic valves: Secondary | ICD-10-CM | POA: Insufficient documentation

## 2013-01-05 DIAGNOSIS — I2510 Atherosclerotic heart disease of native coronary artery without angina pectoris: Secondary | ICD-10-CM | POA: Insufficient documentation

## 2013-01-05 DIAGNOSIS — R9439 Abnormal result of other cardiovascular function study: Secondary | ICD-10-CM | POA: Insufficient documentation

## 2013-01-05 DIAGNOSIS — I743 Embolism and thrombosis of arteries of the lower extremities: Secondary | ICD-10-CM | POA: Insufficient documentation

## 2013-01-05 DIAGNOSIS — I252 Old myocardial infarction: Secondary | ICD-10-CM | POA: Insufficient documentation

## 2013-01-05 DIAGNOSIS — E785 Hyperlipidemia, unspecified: Secondary | ICD-10-CM | POA: Insufficient documentation

## 2013-01-05 DIAGNOSIS — J4489 Other specified chronic obstructive pulmonary disease: Secondary | ICD-10-CM | POA: Insufficient documentation

## 2013-01-05 DIAGNOSIS — I10 Essential (primary) hypertension: Secondary | ICD-10-CM | POA: Insufficient documentation

## 2013-01-05 DIAGNOSIS — Z7901 Long term (current) use of anticoagulants: Secondary | ICD-10-CM | POA: Insufficient documentation

## 2013-01-05 HISTORY — PX: LEFT AND RIGHT HEART CATHETERIZATION WITH CORONARY ANGIOGRAM: SHX5449

## 2013-01-05 SURGERY — LEFT AND RIGHT HEART CATHETERIZATION WITH CORONARY ANGIOGRAM
Anesthesia: LOCAL

## 2013-01-05 MED ORDER — FAMOTIDINE IN NACL 20-0.9 MG/50ML-% IV SOLN
20.0000 mg | INTRAVENOUS | Status: AC
  Administered 2013-01-05: 20 mg via INTRAVENOUS
  Filled 2013-01-05: qty 50

## 2013-01-05 MED ORDER — FENTANYL CITRATE 0.05 MG/ML IJ SOLN
INTRAMUSCULAR | Status: AC
  Filled 2013-01-05: qty 2

## 2013-01-05 MED ORDER — SODIUM CHLORIDE 0.9 % IV SOLN: 250.0000 mL | INTRAVENOUS | Status: DC | PRN

## 2013-01-05 MED ORDER — DIPHENHYDRAMINE HCL 50 MG/ML IJ SOLN
25.0000 mg | INTRAMUSCULAR | Status: AC
  Administered 2013-01-05: 09:00:00 via INTRAVENOUS

## 2013-01-05 MED ORDER — MIDAZOLAM HCL 2 MG/2ML IJ SOLN
INTRAMUSCULAR | Status: AC
  Filled 2013-01-05: qty 2

## 2013-01-05 MED ORDER — HEPARIN SODIUM (PORCINE) 1000 UNIT/ML IJ SOLN
INTRAMUSCULAR | Status: AC
  Filled 2013-01-05: qty 1

## 2013-01-05 MED ORDER — PREDNISONE 20 MG PO TABS: 60.0000 mg | ORAL_TABLET | ORAL | Status: DC

## 2013-01-05 MED ORDER — LIDOCAINE HCL (PF) 1 % IJ SOLN
INTRAMUSCULAR | Status: AC
  Filled 2013-01-05: qty 30

## 2013-01-05 MED ORDER — SODIUM CHLORIDE 0.9 % IJ SOLN: 3.0000 mL | INTRAMUSCULAR | Status: DC | PRN

## 2013-01-05 MED ORDER — SODIUM CHLORIDE 0.9 % IV SOLN: 1.0000 mL/kg/h | INTRAVENOUS | Status: DC

## 2013-01-05 MED ORDER — VERAPAMIL HCL 2.5 MG/ML IV SOLN
INTRAVENOUS | Status: AC
  Filled 2013-01-05: qty 2

## 2013-01-05 MED ORDER — SODIUM CHLORIDE 0.9 % IJ SOLN: 3.0000 mL | Freq: Two times a day (BID) | INTRAMUSCULAR | Status: DC

## 2013-01-05 MED ORDER — DIPHENHYDRAMINE HCL 50 MG/ML IJ SOLN
INTRAMUSCULAR | Status: AC
  Filled 2013-01-05: qty 1

## 2013-01-05 MED ORDER — ASPIRIN 81 MG PO CHEW
81.0000 mg | CHEWABLE_TABLET | ORAL | Status: AC
  Administered 2013-01-05: 81 mg via ORAL
  Filled 2013-01-05: qty 1

## 2013-01-05 MED ORDER — HEPARIN (PORCINE) IN NACL 2-0.9 UNIT/ML-% IJ SOLN
INTRAMUSCULAR | Status: AC
  Filled 2013-01-05: qty 1000

## 2013-01-05 MED ORDER — ADENOSINE 12 MG/4ML IV SOLN
12.0000 mL | Freq: Once | INTRAVENOUS | Status: DC
  Filled 2013-01-05: qty 12

## 2013-01-05 MED ORDER — ACETAMINOPHEN 325 MG PO TABS: 650.0000 mg | ORAL_TABLET | ORAL | Status: DC | PRN

## 2013-01-05 MED ORDER — ONDANSETRON HCL 4 MG/2ML IJ SOLN: 4.0000 mg | Freq: Four times a day (QID) | INTRAMUSCULAR | Status: DC | PRN

## 2013-01-05 MED ORDER — PREDNISONE 20 MG PO TABS
60.0000 mg | ORAL_TABLET | ORAL | Status: AC
  Administered 2013-01-05: 60 mg via ORAL
  Filled 2013-01-05: qty 3

## 2013-01-05 MED ORDER — SODIUM CHLORIDE 0.9 % IV SOLN
INTRAVENOUS | Status: DC
  Administered 2013-01-05: 09:00:00 via INTRAVENOUS

## 2013-01-05 MED ORDER — NITROGLYCERIN 0.2 MG/ML ON CALL CATH LAB
INTRAVENOUS | Status: AC
  Filled 2013-01-05: qty 1

## 2013-01-05 NOTE — CV Procedure (Addendum)
Cardiac Catheterization Procedure Note  Name: Robert Richard MRN: [id number] DOB: [birth year]  Procedure: Left Heart Cath, Selective Coronary Angiography, Pressure Wire analysis of the LAD/Left main  Indication: fatigue, weakness, exercise intolerance. Abnormal Myoview with inferolateral scar.   Procedural Details: The right wrist was prepped, draped, and anesthetized with 1% lidocaine. Using the modified Seldinger technique, a 5/6 French sheath was introduced into the right radial artery. 3 mg of verapamil was administered through the sheath, weight-based unfractionated heparin was administered intravenously. Standard Judkins catheters were used for selective coronary angiography. Catheter exchanges were performed over an exchange length guidewire. The aortic valve was difficult to cross. I used an AL-1 catheter and a straight tip wire. Left ventricular pressures were recorded. Ventriculography was deferred as the patient has normal left ventricular function by echo.  Following the diagnostic procedure, I elected to proceed with pressure wire analysis of the left main/LAD. The patient has severe calcific ostial stenosis of the right coronary artery. The left main has 50% stenosis in the LAD has diffuse 75% proximal stenosis. I felt it was important to define hemodynamic significance of the left main/LAD in order to best determine his revascularization strategy. An XB LAD 3.5 cm guide catheter was used. The pressure wire was zeroed, normalized, and then advanced beyond the lesion into the mid LAD after the patient had a therapeutic INR from unfractionated heparin administration. The FFR was highly significant at rest at 0.65. Because of the significant value, adenosine was not administered. The patient tolerated the procedure well. The guide catheter and wire were moved. There were no immediate procedural complications. A TR band was used for radial hemostasis at the completion of the procedure.  The  patient was transferred to the post catheterization recovery area for further monitoring.  Procedural Findings: Hemodynamics: AO 112/56 LV 133/2  Coronary angiography: Coronary dominance: right  Left mainstem: The left mainstem is heavily calcified. The vessel has 30% proximal stenosis and 50% distal stenosis. The left main trifurcates into the LAD, intermediate branch, and left circumflex.  Left anterior descending (LAD): The LAD has severe proximal vessel stenosis with 75% stenosis at the ostium and 75% stenosis in the mid vessel. There is heavy calcification throughout the proximal and mid LAD. The diagonal branches are patent. The distal/apical LAD has 75% stenosis.  Left circumflex (LCx): There is a moderate caliber intermediate branch that divides into twin vessels. This branch has 60-70% stenosis. The AV circumflex also has proximal disease with 50-60% stenosis. It divides into 2 obtuse marginal branches.  Right coronary artery (RCA): The RCA has a heavy calcification and severe ostial stenosis of 90%. There is segmental disease in the proximal RCA with diffuse 60-70% stenosis until the vessel becomes large in size in its midportion. There is mild nonobstructive disease and the distal branch vessels are good targets with a PDA branch and 2 posterolateral branches.  Left ventriculography: Deferred  Pressure wire analysis of the left main/LAD: 0.65 without adenosine administration.  Final Conclusions:   1. Mild to moderate distal left mainstem stenosis 2. Severe proximal LAD stenosis (diffuse calcific disease) 3. Moderately severe left circumflex/ramus intermedius stenosis 4. Severe ostial RCA stenosis 5. At least mild aortic stenosis  Recommendations: While the patient's symptoms are somewhat nonspecific with exercise intolerance as the primary symptom, he has significant multivessel coronary artery disease. His symptoms have progressed over the past year. Treatment of his aortic  valve will also have to be considered. Will arrange outpatient cardiac surgery evaluation.  Tonny Bollman 01/05/2013, 10:37 AM

## 2013-01-05 NOTE — Interval H&P Note (Signed)
History and Physical Interval Note:  01/05/2013 9:06 AM  Robert Richard  has presented today for surgery, with the diagnosis of Chest pain  The various methods of treatment have been discussed with the patient and family. After consideration of risks, benefits and other options for treatment, the patient has consented to  Procedure(s): LEFT AND RIGHT HEART CATHETERIZATION WITH CORONARY ANGIOGRAM (N/A) as a surgical intervention .  The patient's history has been reviewed, patient examined, no change in status, stable for surgery.  I have reviewed the patient's chart and labs.  Questions were answered to the patient's satisfaction.    Cath Lab Visit (complete for each Cath Lab visit)  Clinical Evaluation Leading to the Procedure:   ACS: no  Non-ACS:    Anginal Classification: No Symptoms  Anti-ischemic medical therapy: Minimal Therapy (1 class of medications)  Non-Invasive Test Results: Low-risk stress test findings: cardiac mortality <1%/year  Prior CABG: No previous CABG         Tonny Bollman

## 2013-01-05 NOTE — H&P (View-Only) (Signed)
HPI  Patient is a 79 yo with a history of L popliteal artery embolus and mild mitral stenosis. I saw him in clinic in Jan 2013 He also has a history of HTN and HL No documented afib.  Echo in June 2013 confirmed mild mitral stenososi He also has mild aortic stenosis. LV function was normal.  Patient denies CP Does note some fatigue.  Allergies   Allergen  Reactions   .  Ivp Dye [Iodinated Diagnostic Agents]  Hives    Current Outpatient Prescriptions   Medication  Sig  Dispense  Refill   .  AMBULATORY NON FORMULARY MEDICATION  Medication Name: Legatrin PM 1 by mouth as needed for leg cramps or pain     .  busPIRone (BUSPAR) 15 MG tablet  TAKE ONE-HALF TABLET BY MOUTH TWICE DAILY  90 tablet  0   .  Calcium Carbonate-Vit D-Min (CALTRATE 600+D PLUS PO)  Take by mouth daily.     .  cetirizine (ZYRTEC) 10 MG tablet  Take 10 mg by mouth as needed for allergies.     .  Cholecalciferol (VITAMIN D3) 2000 UNITS TABS  Take by mouth daily.     Marland Kitchen  diltiazem (CARDIZEM CD) 180 MG 24 hr capsule  Take 1 capsule (180 mg total) by mouth daily.  30 capsule  2   .  diphenhydramine-acetaminophen (TYLENOL PM) 25-500 MG TABS  Take 1 tablet by mouth at bedtime as needed.     .  ferrous sulfate 325 (65 FE) MG tablet  Take 325 mg by mouth daily with breakfast.     .  fluticasone (FLONASE) 50 MCG/ACT nasal spray  Place 1 spray into the nose 2 (two) times daily as needed for rhinitis.  16 g  2   .  ketotifen (ZADITOR) 0.025 % ophthalmic solution  Place 1 drop into both eyes daily as needed.     .  Lidocaine HCl 4 % SOLN  Apply 4 mLs topically. Dr.Freeman, for Migraines     .  Multiple Vitamin (MULTIVITAMIN) tablet  Take 1 tablet by mouth daily.     .  Omega-3 Fatty Acids (FISH OIL CONCENTRATE PO)  Take 1,400 mg by mouth.     .  polyethylene glycol powder (MIRALAX) powder  Take 17 g by mouth daily.     .  pravastatin (PRAVACHOL) 40 MG tablet  1 by mouth daily  90 tablet  0   .  ranitidine (ZANTAC) 150 MG tablet  TAKE ONE  TABLET TWICE DAILY  180 tablet  0   .  Rivaroxaban (XARELTO) 20 MG TABS  Take 1 tablet (20 mg total) by mouth daily with supper.  30 tablet  6   .  SF 5000 PLUS 1.1 % CREA dental cream  As directed     .  sildenafil (VIAGRA) 100 MG tablet  1/2-1 qd prn  5 tablet  5   .  Specialty Vitamins Products (MAGNESIUM, AMINO ACID CHELATE,) 133 MG tablet  Take 1 tablet by mouth daily.     Marland Kitchen  topiramate (TOPAMAX) 25 MG capsule  Take 25 mg by mouth daily. RXed by Dr.Freeman      No current facility-administered medications for this visit.    Past Medical History   Diagnosis  Date   .  Anemia    .  Colon polyp    .  COPD (chronic obstructive pulmonary disease)      Astmatic bronchitis   .  Asthma with bronchitis    .  Gout      PMH   .  Osteoporosis    .  Allergic rhinitis    .  Skin cancer, basal cell      Dr Margo Aye, PMH   .  Nephrolithiasis    .  HLD (hyperlipidemia)    .  Skin cancer (melanoma)  2012     Right neck   .  Dysrhythmia  08/2012     NEW ONSET ATRIAL FIBRILATION   .  Arthritis      SHOULDER    Past Surgical History   Procedure  Laterality  Date   .  Finger surgery       for foreign body   .  Colonoscopy w/ polypectomy       Dr Arlyce Dice   .  Inguinal hernia repair     .  Nose surgery       Septal Deviation   .  Neck surgery   12/2010     Melanoma ; [facility]   .  Leg surgery   06/2010     Dr.Lawson, for blood clott. Left leg    Family History   Problem  Relation  Age of Onset   .  Allergies  Mother    .  Coronary artery disease  Mother    .  Allergy (severe)  Mother    .  Allergies  Father    .  Stroke  Father    .  Heart attack  Father  39   .  Asthma  Father    .  Allergy (severe)  Father    .  Allergies  Sister    .  Allergy (severe)  Sister    .  Allergies  Brother      x2   .  Allergy (severe)  Brother    .  Diabetes  Brother    .  Allergy (severe)  Brother    .  COPD  Neg Hx    .  Cancer  Son     History    Social History   .  Marital Status:   Married     Spouse Name:  N/A     Number of Children:  N/A   .  Years of Education:  N/A    Occupational History   .  retired  Therapist, music [employer]    Social History Main Topics   .  Smoking status:  Former Smoker -- 3.00 packs/day     Types:  Cigarettes     Quit date:  02/19/1961   .  Smokeless tobacco:  Never Used      Comment: started at age 16   .  Alcohol Use:  No   .  Drug Use:  No   .  Sexual Activity:  Not on file    Other Topics  Concern   .  Not on file    Social History Narrative   .  No narrative on file    Review of Systems: All systems reviewed. They are negative to the above problem except as previously stated.  Vital Signs:  BP 120/70  Pulse 69  Ht 6\' 1"  (1.854 m)  Wt 152 lb (68.947 kg)  BMI 20.06 kg/m2  Physical Exam  Patient is in NAD  HEENT: Normocephalic, atraumatic. EOMI, PERRLA.  Neck: JVP is normal. Murmur radiating to neck  Lungs: clear to auscultation. No rales no wheezes.  Heart: Regular rate and rhythm. Normal S1, S2. No  S3 Gr III/Vi systolic murmur LSB to Base and apex No diastolic murmurs audiuble PMI not displaced.  Abdomen: Supple, nontender. Normal bowel sounds. No masses. No hepatomegaly.  Extremities: Good distal pulses throughout. No lower extremity edema.  Musculoskeletal :moving all extremities.  Neuro: alert and oriented x3. CN II-XII grossly intact.  Assessment and Plan:  1. SOB/fatigue Will set up for [employer]  2. Popliteal embolus Patient should be on lifelong anticoag.  3. Valve dz. Echo in June with mild AS and MS Follow Periodic echoes.  4. HL Good control in November.   Addendum:  01/02/2013  Since he was seen on Sept 25, 2014 she had a lexiscan myoview that showed inferolateral infarct from apex to base.  Images not gated.  This was different from previuos scan which was normal He went on to have echo which showed normal LV systolic function and normal wall motion Patient continues to feel poorly  SOB Gives out  ?  If myoview with hypernating myocardium  Would recomm L heart cath to define anatomy  Patient agrees with plan.    Dietrich Pates 01/02/2013

## 2013-01-06 LAB — POCT ACTIVATED CLOTTING TIME
Activated Clotting Time: 196 seconds
Activated Clotting Time: 211 seconds

## 2013-01-07 ENCOUNTER — Encounter: Payer: Self-pay | Admitting: Cardiothoracic Surgery

## 2013-01-07 ENCOUNTER — Ambulatory Visit (INDEPENDENT_AMBULATORY_CARE_PROVIDER_SITE_OTHER): Payer: Medicare Other | Admitting: Cardiothoracic Surgery

## 2013-01-07 VITALS — BP 124/73 | HR 96 | Resp 16 | Ht 73.0 in | Wt 153.0 lb

## 2013-01-07 DIAGNOSIS — I359 Nonrheumatic aortic valve disorder, unspecified: Secondary | ICD-10-CM

## 2013-01-07 DIAGNOSIS — I2510 Atherosclerotic heart disease of native coronary artery without angina pectoris: Secondary | ICD-10-CM

## 2013-01-07 DIAGNOSIS — I350 Nonrheumatic aortic (valve) stenosis: Secondary | ICD-10-CM

## 2013-01-07 NOTE — Progress Notes (Signed)
PCP is Marga Melnick, MD Referring Provider is Tonny Bollman, MD  Chief Complaint  Patient presents with  . Coronary Artery Disease    eval and treat...ECHO 12/22/12....Marland KitchenCARDIAC CATH 01/05/13   chief complaint decreased exercise tolerance due to leg weakness  HPI: Interesting 79 year old Caucasian male reformed smoker who recently underwent his first cardiac catheterization for increasing exercise intolerance and an abnormal Myoview scan. He denies chest pain or shortness of breath. He does have mild to moderate COPD with PFTs pending and a history of smoking. 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 He developed atrial fibrillation within the past year and had an embolus to his left leg which was retrieved with embolectomy via the popliteal artery. Since then he is had decreased exercise tolerance and leg weakness. He states his left leg is somewhat more weakness right leg. He denies orthopnea PND or ankle edema. An echocardiogram was performed without Doppler which showed EF of 50-55% with calcified mitral annulus and decreased excursion of the aortic leaflets. Coronary angiograms were performed showing moderate to severe CAD. A mild tgradient noted across the aortic valve. Right heart catheterization was not performed.   Past Medical History  Diagnosis Date  . Anemia   . Colon polyp   . COPD (chronic obstructive pulmonary disease)     Astmatic bronchitis  . Asthma with bronchitis   . Gout     PMH  . Osteoporosis   . Allergic rhinitis   . Skin cancer, basal cell     Dr Margo Aye, PMH  . Nephrolithiasis   . HLD (hyperlipidemia)   . Skin cancer (melanoma) 2012    Right neck  . Dysrhythmia 08/2012    NEW ONSET ATRIAL FIBRILATION  . Arthritis     SHOULDER    Past Surgical History  Procedure Laterality Date  . Finger surgery      for foreign body  . Colonoscopy w/ polypectomy      Dr Arlyce Dice  . Inguinal hernia repair    . Nose surgery      Septal  Deviation  . Neck surgery  12/2010    Melanoma ; [facility]   . Leg surgery  06/2010     Dr.Lawson, for blood clott. Left leg     Family History  Problem Relation Age of Onset  . Allergies Mother   . Coronary artery disease Mother   . Allergy (severe) Mother   . Allergies Father   . Stroke Father   . Heart attack Father 82  . Asthma Father   . Allergy (severe) Father   . Allergies Sister   . Allergy (severe) Sister   . Allergies Brother     x2  . Allergy (severe) Brother   . Diabetes Brother   . Allergy (severe) Brother   . COPD Neg Hx   . Cancer Son     Social History History  Substance Use Topics  . Smoking status: Former Smoker -- 3.00 packs/day    Types: Cigarettes    Quit date: 02/19/1961  . Smokeless tobacco: Never Used     Comment: started at age 89  . Alcohol Use: No    Current Outpatient Prescriptions  Medication Sig Dispense Refill  . AMBULATORY NON FORMULARY MEDICATION Medication Name: Legatrin PM 1 by mouth as needed for leg cramps or pain      . amLODipine (NORVASC) 10 MG tablet Take 10 mg by mouth daily.      . busPIRone (  BUSPAR) 15 MG tablet Take 7.5 mg by mouth 2 (two) times daily.      . Calcium Carbonate-Vit D-Min (CALTRATE 600+D PLUS PO) Take 1 tablet by mouth daily.       . cetirizine (ZYRTEC) 10 MG tablet Take 10 mg by mouth as needed for allergies.      . Cholecalciferol (VITAMIN D3) 2000 UNITS TABS Take 2,000 Units by mouth daily.       Marland Kitchen diltiazem (CARDIZEM CD) 180 MG 24 hr capsule Take 1 capsule (180 mg total) by mouth daily.  30 capsule  5  . diphenhydrAMINE (BENADRYL) 25 mg capsule Take 1 capsule at 4:30 pm on 11/13 and 1 tablet at 10:30 pm on 11/13  2 capsule  0  . diphenhydramine-acetaminophen (TYLENOL PM) 25-500 MG TABS Take 1 tablet by mouth at bedtime as needed.      . famotidine (PEPCID) 20 MG tablet Take 1 tablet (20 mg total) by mouth See admin instructions. Take at 4:30pm and 10:30pm on 11/13  2 tablet  0  . ferrous sulfate  325 (65 FE) MG tablet Take 325 mg by mouth daily with breakfast.      . fluticasone (FLONASE) 50 MCG/ACT nasal spray Place 1 spray into the nose 2 (two) times daily as needed for rhinitis.  16 g  2  . ketotifen (ZADITOR) 0.025 % ophthalmic solution Place 1 drop into both eyes daily as needed.        . Lidocaine HCl 4 % SOLN Apply 4 mLs topically as needed (for migraines). Dr.Freeman, for Migraines      . Multiple Vitamin (MULTIVITAMIN) tablet Take 1 tablet by mouth daily.        . Omega-3 Fatty Acids (FISH OIL CONCENTRATE PO) Take 1,400 mg by mouth.        . polyethylene glycol powder (MIRALAX) powder Take 17 g by mouth daily.      . pravastatin (PRAVACHOL) 40 MG tablet Take 40 mg by mouth daily.      . ranitidine (ZANTAC) 150 MG tablet Take 150 mg by mouth 2 (two) times daily.      . Rivaroxaban (XARELTO) 20 MG TABS Take 1 tablet (20 mg total) by mouth daily with supper.  30 tablet  6  . SF 5000 PLUS 1.1 % CREA dental cream As directed      . sildenafil (VIAGRA) 100 MG tablet Take 50-100 mg by mouth daily as needed for erectile dysfunction.      Marland Kitchen Specialty Vitamins Products (MAGNESIUM, AMINO ACID CHELATE,) 133 MG tablet Take 1 tablet by mouth daily.        Marland Kitchen topiramate (TOPAMAX) 25 MG capsule Take 25 mg by mouth daily. RXed by Dr.Freeman      . vitamin B-12 (CYANOCOBALAMIN) 500 MCG tablet Take 500 mcg by mouth daily.       No current facility-administered medications for this visit.    Allergies  Allergen Reactions  . Ivp Dye [Iodinated Diagnostic Agents] Hives    Review of Systems  No significant change in weight recently he denies fever or night sweats No history thoracic trauma No history of cardiac murmur rheumatic fever. 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 He is right-hand dominant and had cardiac angiogram via right radial artery No significant bleeding problems on Xarelto No history stroke or diabetes He does have symptoms that could be claudication  BP 124/73  Pulse 96   Resp 16  Ht 6\' 1"  (1.854 m)  Wt 153  lb (69.4 kg)  BMI 20.19 kg/m2  SpO2 98% Physical Exam Alert and comfortable HEENT normocephalic good dentition Neck without JVD carotid bruit or adenopathy Chest without deformity or tenderness, breath sounds distant and clear Cardiac heart rate irregular no significant murmur Abdomen soft without pulsatile mass Extremities without clubbing cyanosis or edema, surgical scar over left knee for popliteal artery thromboembolectomy Vascular with good palpable pulses in both radials, good femoral pulses, palpable right posterior tibial, no palpable pulses in the left foot  Diagnostic Tests: Coronary angiogram reviewed, 2-D echocardiogram done without Dopplers reviewed chest x-ray studies reviewed  Impression: 79 year old with problems with decreasing exercise tolerance with documented coronary disease, possible aortic stenosis but not clear based on current studies, and possible comorbid pulmonary and peripheral  vascular disease.  Plan: The patient needs TEE to better assess the aortic valve and right heart catheterization to rule out pulmonary hypertension as the patient has severe calcified mitral annulus and there is reference to mitral stenosis in the medical record. We also will obtain PFTs and ABIs then reevaluate the patient for possible CABG and aVR.

## 2013-01-08 ENCOUNTER — Other Ambulatory Visit: Payer: Self-pay | Admitting: *Deleted

## 2013-01-08 DIAGNOSIS — I2510 Atherosclerotic heart disease of native coronary artery without angina pectoris: Secondary | ICD-10-CM

## 2013-01-08 DIAGNOSIS — I739 Peripheral vascular disease, unspecified: Secondary | ICD-10-CM

## 2013-01-08 LAB — PULMONARY FUNCTION TEST
DL/VA % pred: 93 %
DL/VA: 4.25 ml/min/mmHg/L
DLCO cor % pred: 86 %
DLCO cor: 26.72 ml/min/mmHg
DLCO unc % pred: 76 %
DLCO unc: 23.65 ml/min/mmHg
FEF 25-75 Pre: 2.75 L/sec
FEF2575-%Pred-Pre: 148 %
FEV1-%Pred-Pre: 127 %
FEV1-Pre: 3.46 L
FEV1FVC-%Pred-Pre: 103 %
FEV6-%Pred-Pre: 129 %
FEV6-Pre: 4.62 L
FEV6FVC-%Pred-Pre: 105 %
FVC-%Pred-Pre: 122 %
FVC-Pre: 4.69 L
Pre FEV1/FVC ratio: 74 %
Pre FEV6/FVC Ratio: 99 %
RV % pred: 78 %
RV: 2.04 L
TLC % pred: 101 %
TLC: 6.98 L

## 2013-01-09 ENCOUNTER — Ambulatory Visit (HOSPITAL_COMMUNITY)
Admission: RE | Admit: 2013-01-09 | Discharge: 2013-01-09 | Disposition: A | Discharge status: Home / Self Care | Payer: Medicare Other | Source: Ambulatory Visit | Attending: Cardiothoracic Surgery | Admitting: Cardiothoracic Surgery

## 2013-01-09 ENCOUNTER — Other Ambulatory Visit (HOSPITAL_COMMUNITY): Payer: Self-pay | Admitting: Cardiothoracic Surgery

## 2013-01-09 DIAGNOSIS — I739 Peripheral vascular disease, unspecified: Secondary | ICD-10-CM

## 2013-01-09 DIAGNOSIS — I25810 Atherosclerosis of coronary artery bypass graft(s) without angina pectoris: Secondary | ICD-10-CM

## 2013-01-09 DIAGNOSIS — I2510 Atherosclerotic heart disease of native coronary artery without angina pectoris: Secondary | ICD-10-CM

## 2013-01-09 DIAGNOSIS — M79609 Pain in unspecified limb: Secondary | ICD-10-CM

## 2013-01-09 NOTE — Progress Notes (Signed)
VASCULAR LAB PRELIMINARY  ARTERIAL  ABI completed:    RIGHT    LEFT    PRESSURE WAVEFORM  PRESSURE WAVEFORM  BRACHIAL 147 Triphasic BRACHIAL 140 Triphasic  DP 194 Triphasic DP 139 Triphasic  AT   AT    PT Noncompressible Biphasic PT Noncompressible Biphasic  PER   PER    GREAT TOE  NA GREAT TOE  NA    RIGHT LEFT  ABI 1.32 0.95    The right ABI is slightly elevated, and bilateral posterior tibial arteries are noncompressible, suggestive of calcified vessels. The left ABI is within normal limits.  01/09/2013 12:42 PM Gertie Fey, RVT, RDCS, RDMS

## 2013-01-20 ENCOUNTER — Encounter (HOSPITAL_COMMUNITY): Payer: Self-pay | Admitting: Pharmacist

## 2013-01-21 ENCOUNTER — Other Ambulatory Visit (HOSPITAL_COMMUNITY): Payer: Self-pay | Admitting: Anesthesiology

## 2013-01-21 ENCOUNTER — Telehealth (HOSPITAL_COMMUNITY): Payer: Self-pay | Admitting: *Deleted

## 2013-01-21 ENCOUNTER — Encounter (HOSPITAL_COMMUNITY): Payer: Self-pay | Admitting: *Deleted

## 2013-01-21 DIAGNOSIS — I350 Nonrheumatic aortic (valve) stenosis: Secondary | ICD-10-CM

## 2013-01-21 NOTE — Telephone Encounter (Signed)
Per Dr Morton Peters pt needs TEE for aortic stenosis and Right Heart Cath to r/o pulm htn, procedures sch for Fri 12/5 TEE at 10 and RHC at 11:30, pt and wife aware and instructions reviewed with them via phone, per Dr Gala Romney just hold Xarelto night before procedure

## 2013-01-23 ENCOUNTER — Encounter (HOSPITAL_COMMUNITY)
Admission: RE | Disposition: A | Discharge status: Home / Self Care | Payer: Self-pay | Source: Ambulatory Visit | Attending: Internal Medicine

## 2013-01-23 ENCOUNTER — Ambulatory Visit (HOSPITAL_COMMUNITY)
Admission: RE | Admit: 2013-01-23 | Discharge: 2013-01-23 | Disposition: A | Discharge status: Home / Self Care | Payer: Medicare Other | Source: Ambulatory Visit | Attending: Internal Medicine | Admitting: Internal Medicine

## 2013-01-23 DIAGNOSIS — I517 Cardiomegaly: Secondary | ICD-10-CM | POA: Insufficient documentation

## 2013-01-23 DIAGNOSIS — I359 Nonrheumatic aortic valve disorder, unspecified: Secondary | ICD-10-CM

## 2013-01-23 DIAGNOSIS — I350 Nonrheumatic aortic (valve) stenosis: Secondary | ICD-10-CM

## 2013-01-23 DIAGNOSIS — I080 Rheumatic disorders of both mitral and aortic valves: Secondary | ICD-10-CM | POA: Insufficient documentation

## 2013-01-23 HISTORY — PX: TEE WITHOUT CARDIOVERSION: SHX5443

## 2013-01-23 HISTORY — PX: RIGHT HEART CATHETERIZATION: SHX5447

## 2013-01-23 LAB — BASIC METABOLIC PANEL
BUN: 20 mg/dL (ref 6–23)
CO2: 22 mEq/L (ref 19–32)
Calcium: 9.4 mg/dL (ref 8.4–10.5)
Chloride: 108 mEq/L (ref 96–112)
Creatinine, Ser: 1.35 mg/dL (ref 0.50–1.35)
GFR calc Af Amer: 56 mL/min — ABNORMAL LOW (ref >90)
GFR calc non Af Amer: 48 mL/min — ABNORMAL LOW (ref >90)
Glucose, Bld: 108 mg/dL — ABNORMAL HIGH (ref 70–99)
Potassium: 4.3 mEq/L (ref 3.5–5.1)
Sodium: 141 mEq/L (ref 135–145)

## 2013-01-23 LAB — CBC
HCT: 33.7 % — ABNORMAL LOW (ref 39.0–52.0)
Hemoglobin: 11.4 g/dL — ABNORMAL LOW (ref 13.0–17.0)
MCH: 29.4 pg (ref 26.0–34.0)
MCHC: 33.8 g/dL (ref 30.0–36.0)
MCV: 86.9 fL (ref 78.0–100.0)
Platelets: 202 10*3/uL (ref 150–400)
RBC: 3.88 MIL/uL — ABNORMAL LOW (ref 4.22–5.81)
RDW: 13.9 % (ref 11.5–15.5)
WBC: 7.5 10*3/uL (ref 4.0–10.5)

## 2013-01-23 LAB — POCT I-STAT 3, VENOUS BLOOD GAS (G3P V)
Acid-base deficit: 6 mmol/L — ABNORMAL HIGH (ref 0.0–2.0)
Acid-base deficit: 6 mmol/L — ABNORMAL HIGH (ref 0.0–2.0)
Acid-base deficit: 9 mmol/L — ABNORMAL HIGH (ref 0.0–2.0)
Bicarbonate: 17.3 mEq/L — ABNORMAL LOW (ref 20.0–24.0)
Bicarbonate: 19.3 mEq/L — ABNORMAL LOW (ref 20.0–24.0)
Bicarbonate: 19.5 mEq/L — ABNORMAL LOW (ref 20.0–24.0)
O2 Saturation: 58 %
O2 Saturation: 65 %
O2 Saturation: 72 %
TCO2: 18 mmol/L (ref 0–100)
TCO2: 20 mmol/L (ref 0–100)
TCO2: 21 mmol/L (ref 0–100)
pCO2, Ven: 35.5 mmHg — ABNORMAL LOW (ref 45.0–50.0)
pCO2, Ven: 35.6 mmHg — ABNORMAL LOW (ref 45.0–50.0)
pCO2, Ven: 36.8 mmHg — ABNORMAL LOW (ref 45.0–50.0)
pH, Ven: 7.281 (ref 7.250–7.300)
pH, Ven: 7.344 — ABNORMAL HIGH (ref 7.250–7.300)
pH, Ven: 7.346 — ABNORMAL HIGH (ref 7.250–7.300)
pO2, Ven: 34 mmHg (ref 30.0–45.0)
pO2, Ven: 35 mmHg (ref 30.0–45.0)
pO2, Ven: 39 mmHg (ref 30.0–45.0)

## 2013-01-23 LAB — PROTIME-INR
INR: 1.32 (ref 0.00–1.49)
Prothrombin Time: 16.1 seconds — ABNORMAL HIGH (ref 11.6–15.2)

## 2013-01-23 SURGERY — RIGHT HEART CATH
Anesthesia: LOCAL

## 2013-01-23 SURGERY — ECHOCARDIOGRAM, TRANSESOPHAGEAL
Anesthesia: Moderate Sedation

## 2013-01-23 MED ORDER — LIDOCAINE VISCOUS 2 % MT SOLN
OROMUCOSAL | Status: AC
  Filled 2013-01-23: qty 15

## 2013-01-23 MED ORDER — ACETAMINOPHEN 325 MG PO TABS: 650.0000 mg | ORAL_TABLET | ORAL | Status: DC | PRN

## 2013-01-23 MED ORDER — FENTANYL CITRATE 0.05 MG/ML IJ SOLN
INTRAMUSCULAR | Status: DC | PRN
  Administered 2013-01-23 (×2): 25 ug via INTRAVENOUS

## 2013-01-23 MED ORDER — ONDANSETRON HCL 4 MG/2ML IJ SOLN: 4.0000 mg | Freq: Four times a day (QID) | INTRAMUSCULAR | Status: DC | PRN

## 2013-01-23 MED ORDER — FENTANYL CITRATE 0.05 MG/ML IJ SOLN
INTRAMUSCULAR | Status: AC
  Filled 2013-01-23: qty 2

## 2013-01-23 MED ORDER — SODIUM CHLORIDE 0.9 % IV SOLN
INTRAVENOUS | Status: DC
  Administered 2013-01-23: 500 mL via INTRAVENOUS

## 2013-01-23 MED ORDER — SODIUM CHLORIDE 0.9 % IJ SOLN: 3.0000 mL | Freq: Two times a day (BID) | INTRAMUSCULAR | Status: DC

## 2013-01-23 MED ORDER — MIDAZOLAM HCL 5 MG/ML IJ SOLN
INTRAMUSCULAR | Status: AC
  Filled 2013-01-23: qty 2

## 2013-01-23 MED ORDER — MIDAZOLAM HCL 10 MG/2ML IJ SOLN
INTRAMUSCULAR | Status: DC | PRN
  Administered 2013-01-23: 1 mg via INTRAVENOUS
  Administered 2013-01-23: 2 mg via INTRAVENOUS
  Administered 2013-01-23: 1 mg via INTRAVENOUS

## 2013-01-23 MED ORDER — SODIUM CHLORIDE 0.9 % IV SOLN: 250.0000 mL | INTRAVENOUS | Status: DC | PRN

## 2013-01-23 MED ORDER — SODIUM CHLORIDE 0.9 % IJ SOLN: 3.0000 mL | INTRAMUSCULAR | Status: DC | PRN

## 2013-01-23 MED ORDER — BUTAMBEN-TETRACAINE-BENZOCAINE 2-2-14 % EX AERO
INHALATION_SPRAY | CUTANEOUS | Status: DC | PRN
  Administered 2013-01-23: 2 via TOPICAL

## 2013-01-23 NOTE — Progress Notes (Signed)
Echocardiogram 2D Echocardiogram has been performed.  Dorothey Baseman 01/23/2013, 10:50 AM

## 2013-01-23 NOTE — CV Procedure (Signed)
    TRANSESOPHAGEAL ECHOCARDIOGRAM   NAME:  Robert Richard   MRN: [id number] DOB:  10-Jan-1931   ADMIT DATE: 01/23/2013  INDICATIONS: Aortic stenosis   PROCEDURE:   Informed consent was obtained prior to the procedure. The risks, benefits and alternatives for the procedure were discussed and the patient comprehended these risks.  Risks include, but are not limited to, cough, sore throat, vomiting, nausea, somnolence, esophageal and stomach trauma or perforation, bleeding, low blood pressure, aspiration, pneumonia, infection, trauma to the teeth and death.    After a procedural time-out, the patient was given 4 mg versed and 50 mcg fentanyl for moderate sedation.  The oropharynx was anesthetized cetacaine spray.  The transesophageal probe was inserted in the esophagus and stomach without difficulty and multiple views were obtained.    COMPLICATIONS:    There were no immediate complications.  FINDINGS:  LEFT VENTRICLE: EF = 55-60%. No regional wall motion abnormalities. +LVH  RIGHT VENTRICLE: Normal size and function.   LEFT ATRIUM: Moderately dilated LA diameter 5.5 cm. + smoke  LEFT ATRIAL APPENDAGE: No thrombus.   RIGHT ATRIUM: Dilated  AORTIC VALVE:  Trileaflet. Severely calcified. Moderately to severely decreased cusp excursion. Moderate AS AVA by planimetry 1.2 cm. Mean gradient 25 mm HG (may be underestimated due to lack of alignment). No AI  MITRAL VALVE:    Moderately thickened. Severe annular calcification. Posterior leaflet is restricted. Mild MR. Mild MS mean gradient = 3mm HG. MVA by pressure half time 1.6 cm2. Only able to visualize 1 pulmonary vein - no flow reversal.   TRICUSPID VALVE: Normal. No TR.  PULMONIC VALVE: Grossly normal. Trivial PI  INTERATRIAL SEPTUM: No PFO or ASD.  PERICARDIUM: No effusion  DESCENDING AORTA: Seen briefly as scope had to be removed due to patient coughing. Moderate to severe diffuse plaque.   [place],MD 10:39 AM

## 2013-01-23 NOTE — H&P (View-Only) (Signed)
PCP is William Hopper, MD Referring Provider is Cooper, Michael, MD  Chief Complaint  Patient presents with  . Coronary Artery Disease    eval and treat...ECHO 12/22/12.....CARDIAC CATH 01/05/13   chief complaint decreased exercise tolerance due to leg weakness  HPI: Interesting 79-year-old Caucasian male reformed smoker who recently underwent his first cardiac catheterization for increasing exercise intolerance and an abnormal Myoview scan. He denies chest pain or shortness of breath. He does have mild to moderate COPD with PFTs pending and a history of smoking. 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 He developed atrial fibrillation within the past year and had an embolus to his left leg which was retrieved with embolectomy via the popliteal artery. Since then he is had decreased exercise tolerance and leg weakness. He states his left leg is somewhat more weakness right leg. He denies orthopnea PND or ankle edema. An echocardiogram was performed without Doppler which showed EF of 50-55% with calcified mitral annulus and decreased excursion of the aortic leaflets. Coronary angiograms were performed showing moderate to severe CAD. A mild tgradient noted across the aortic valve. Right heart catheterization was not performed.   Past Medical History  Diagnosis Date  . Anemia   . Colon polyp   . COPD (chronic obstructive pulmonary disease)     Astmatic bronchitis  . Asthma with bronchitis   . Gout     PMH  . Osteoporosis   . Allergic rhinitis   . Skin cancer, basal cell     Dr Hall, PMH  . Nephrolithiasis   . HLD (hyperlipidemia)   . Skin cancer (melanoma) 2012    Right neck  . Dysrhythmia 08/2012    NEW ONSET ATRIAL FIBRILATION  . Arthritis     SHOULDER    Past Surgical History  Procedure Laterality Date  . Finger surgery      for foreign body  . Colonoscopy w/ polypectomy      Dr Kaplan  . Inguinal hernia repair    . Nose surgery      Septal  Deviation  . Neck surgery  12/2010    Melanoma ; [facility]   . Leg surgery  06/2010     Dr.Lawson, for blood clott. Left leg     Family History  Problem Relation Age of Onset  . Allergies Mother   . Coronary artery disease Mother   . Allergy (severe) Mother   . Allergies Father   . Stroke Father   . Heart attack Father 77  . Asthma Father   . Allergy (severe) Father   . Allergies Sister   . Allergy (severe) Sister   . Allergies Brother     x2  . Allergy (severe) Brother   . Diabetes Brother   . Allergy (severe) Brother   . COPD Neg Hx   . Cancer Son     Social History History  Substance Use Topics  . Smoking status: Former Smoker -- 3.00 packs/day    Types: Cigarettes    Quit date: 02/19/1961  . Smokeless tobacco: Never Used     Comment: started at age 18  . Alcohol Use: No    Current Outpatient Prescriptions  Medication Sig Dispense Refill  . AMBULATORY NON FORMULARY MEDICATION Medication Name: Legatrin PM 1 by mouth as needed for leg cramps or pain      . amLODipine (NORVASC) 10 MG tablet Take 10 mg by mouth daily.      . busPIRone (  BUSPAR) 15 MG tablet Take 7.5 mg by mouth 2 (two) times daily.      . Calcium Carbonate-Vit D-Min (CALTRATE 600+D PLUS PO) Take 1 tablet by mouth daily.       . cetirizine (ZYRTEC) 10 MG tablet Take 10 mg by mouth as needed for allergies.      . Cholecalciferol (VITAMIN D3) 2000 UNITS TABS Take 2,000 Units by mouth daily.       . diltiazem (CARDIZEM CD) 180 MG 24 hr capsule Take 1 capsule (180 mg total) by mouth daily.  30 capsule  5  . diphenhydrAMINE (BENADRYL) 25 mg capsule Take 1 capsule at 4:30 pm on 11/13 and 1 tablet at 10:30 pm on 11/13  2 capsule  0  . diphenhydramine-acetaminophen (TYLENOL PM) 25-500 MG TABS Take 1 tablet by mouth at bedtime as needed.      . famotidine (PEPCID) 20 MG tablet Take 1 tablet (20 mg total) by mouth See admin instructions. Take at 4:30pm and 10:30pm on 11/13  2 tablet  0  . ferrous sulfate  325 (65 FE) MG tablet Take 325 mg by mouth daily with breakfast.      . fluticasone (FLONASE) 50 MCG/ACT nasal spray Place 1 spray into the nose 2 (two) times daily as needed for rhinitis.  16 g  2  . ketotifen (ZADITOR) 0.025 % ophthalmic solution Place 1 drop into both eyes daily as needed.        . Lidocaine HCl 4 % SOLN Apply 4 mLs topically as needed (for migraines). Dr.Freeman, for Migraines      . Multiple Vitamin (MULTIVITAMIN) tablet Take 1 tablet by mouth daily.        . Omega-3 Fatty Acids (FISH OIL CONCENTRATE PO) Take 1,400 mg by mouth.        . polyethylene glycol powder (MIRALAX) powder Take 17 g by mouth daily.      . pravastatin (PRAVACHOL) 40 MG tablet Take 40 mg by mouth daily.      . ranitidine (ZANTAC) 150 MG tablet Take 150 mg by mouth 2 (two) times daily.      . Rivaroxaban (XARELTO) 20 MG TABS Take 1 tablet (20 mg total) by mouth daily with supper.  30 tablet  6  . SF 5000 PLUS 1.1 % CREA dental cream As directed      . sildenafil (VIAGRA) 100 MG tablet Take 50-100 mg by mouth daily as needed for erectile dysfunction.      . Specialty Vitamins Products (MAGNESIUM, AMINO ACID CHELATE,) 133 MG tablet Take 1 tablet by mouth daily.        . topiramate (TOPAMAX) 25 MG capsule Take 25 mg by mouth daily. RXed by Dr.Freeman      . vitamin B-12 (CYANOCOBALAMIN) 500 MCG tablet Take 500 mcg by mouth daily.       No current facility-administered medications for this visit.    Allergies  Allergen Reactions  . Ivp Dye [Iodinated Diagnostic Agents] Hives    Review of Systems  No significant change in weight recently he denies fever or night sweats No history thoracic trauma No history of cardiac murmur rheumatic fever. 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 He is right-hand dominant and had cardiac angiogram via right radial artery No significant bleeding problems on Xarelto No history stroke or diabetes He does have symptoms that could be claudication  BP 124/73  Pulse 96   Resp 16  Ht 6' 1" (1.854 m)  Wt 153   lb (69.4 kg)  BMI 20.19 kg/m2  SpO2 98% Physical Exam Alert and comfortable HEENT normocephalic good dentition Neck without JVD carotid bruit or adenopathy Chest without deformity or tenderness, breath sounds distant and clear Cardiac heart rate irregular no significant murmur Abdomen soft without pulsatile mass Extremities without clubbing cyanosis or edema, surgical scar over left knee for popliteal artery thromboembolectomy Vascular with good palpable pulses in both radials, good femoral pulses, palpable right posterior tibial, no palpable pulses in the left foot  Diagnostic Tests: Coronary angiogram reviewed, 2-D echocardiogram done without Dopplers reviewed chest x-ray studies reviewed  Impression: 79-year-old with problems with decreasing exercise tolerance with documented coronary disease, possible aortic stenosis but not clear based on current studies, and possible comorbid pulmonary and peripheral  vascular disease.  Plan: The patient needs TEE to better assess the aortic valve and right heart catheterization to rule out pulmonary hypertension as the patient has severe calcified mitral annulus and there is reference to mitral stenosis in the medical record. We also will obtain PFTs and ABIs then reevaluate the patient for possible CABG and aVR.  

## 2013-01-23 NOTE — CV Procedure (Signed)
Cardiac Cath Procedure Note:  Indication:   Procedures performed:  1) Right heart catheterization  Description of procedure:   The risks and indication of the procedure were explained. Consent was signed and placed on the chart. An appropriate timeout was taken prior to the procedure. The right neck was prepped and draped in the routine sterile fashion and anesthetized with 1% local lidocaine.   A 7 FR venous sheath was placed in the right internal jugular vein using a modified Seldinger technique. A standard Swan-Ganz catheter was used for the procedure.   Complications: None apparent.  Findings:  RA = 6 RV = 24/3/5 PA = 27/12 (19) PCW = 13 (v = 20) Fick cardiac output/index = 5.3/2.8 PVR = 1.1 WU FA sat = 99% PA sat = 72%, 65%  Assessment:  Normal hemodynamics. No evidence of pulmonary HTN   Robert Richard 11:28 AM

## 2013-01-23 NOTE — Interval H&P Note (Signed)
History and Physical Interval Note:  01/23/2013 10:08 AM  Robert Richard  has presented today for surgery, with the diagnosis of aortic stenosis  The various methods of treatment have been discussed with the patient and family. After consideration of risks, benefits and other options for treatment, the patient has consented to  Procedure(s): RIGHT HEART CATH (N/A) and TEE as a surgical intervention .  The patient's history has been reviewed, patient examined, no change in status, stable for surgery.  I have reviewed the patient's chart and labs.  Questions were answered to the patient's satisfaction.     Matix Henshaw

## 2013-01-26 ENCOUNTER — Ambulatory Visit: Payer: Medicare Other | Admitting: Cardiothoracic Surgery

## 2013-01-26 ENCOUNTER — Encounter (HOSPITAL_COMMUNITY): Payer: Self-pay | Admitting: Internal Medicine

## 2013-01-28 ENCOUNTER — Encounter: Payer: Self-pay | Admitting: Cardiothoracic Surgery

## 2013-01-28 ENCOUNTER — Ambulatory Visit (INDEPENDENT_AMBULATORY_CARE_PROVIDER_SITE_OTHER): Payer: Medicare Other | Admitting: Cardiothoracic Surgery

## 2013-01-28 ENCOUNTER — Other Ambulatory Visit: Payer: Self-pay | Admitting: *Deleted

## 2013-01-28 VITALS — BP 138/72 | HR 74 | Resp 20 | Ht 73.0 in | Wt 154.0 lb

## 2013-01-28 DIAGNOSIS — I2510 Atherosclerotic heart disease of native coronary artery without angina pectoris: Secondary | ICD-10-CM

## 2013-01-28 DIAGNOSIS — I350 Nonrheumatic aortic (valve) stenosis: Secondary | ICD-10-CM

## 2013-01-28 DIAGNOSIS — I359 Nonrheumatic aortic valve disorder, unspecified: Secondary | ICD-10-CM

## 2013-01-28 DIAGNOSIS — I739 Peripheral vascular disease, unspecified: Secondary | ICD-10-CM

## 2013-01-29 ENCOUNTER — Other Ambulatory Visit: Payer: Self-pay | Admitting: Internal Medicine

## 2013-01-29 ENCOUNTER — Other Ambulatory Visit: Payer: Self-pay | Admitting: *Deleted

## 2013-01-29 MED ORDER — NITROGLYCERIN 0.4 MG SL SUBL: 0.4000 mg | SUBLINGUAL_TABLET | SUBLINGUAL | Status: DC | PRN

## 2013-01-29 NOTE — Progress Notes (Signed)
PCP is Marga Melnick, MD Referring Provider is Tonny Bollman, MD  Chief Complaint  Patient presents with  . Follow-up    with right heart cath, TEE, PFT'S AND ABI'S to further discuss surgery    HPI: 79 year old gentleman with significant COPD aortic stenosis severe and three-vessel CAD with EF 50%. Since his last visit he underwent a TEE by Dr Arvilla Meres which documented severe aortic stenosis. The mitral annulus was heavily calcified without significant MR or mitral stenosis. Right heart catheterization showed normal filling pressures with good cardiac output. PFTs demonstrated adequate FEV1 but with decreased DLCO. ABIs were 0.8 the patient admits to exertional chest discomfort and dyspnea. He denies recent symptoms of bronchitis or chest infection. He is nonsmoking. He takesXareltofor atrial fib which will be stopped preoperatively.   Past Medical History  Diagnosis Date  . Anemia   . Colon polyp   . COPD (chronic obstructive pulmonary disease)     Astmatic bronchitis  . Asthma with bronchitis   . Gout     PMH  . Osteoporosis   . Allergic rhinitis   . Skin cancer, basal cell     Dr Margo Aye, PMH  . Nephrolithiasis   . HLD (hyperlipidemia)   . Skin cancer (melanoma) 2012    Right neck  . Dysrhythmia 08/2012    NEW ONSET ATRIAL FIBRILATION  . Arthritis     SHOULDER    Past Surgical History  Procedure Laterality Date  . Finger surgery      for foreign body  . Colonoscopy w/ polypectomy      Dr Arlyce Dice  . Inguinal hernia repair    . Nose surgery      Septal Deviation  . Neck surgery  12/2010    Melanoma ; [facility]   . Leg surgery  06/2010     Dr.Lawson, for blood clott. Left leg   . Tee without cardioversion N/A 01/23/2013    Procedure: TRANSESOPHAGEAL ECHOCARDIOGRAM (TEE);  Surgeon: Dolores Patty, MD;  Location: [facility] ENDOSCOPY;  Service: Cardiovascular;  Laterality: N/A;  sign heart cath consent w/tee consent    Family History  Problem Relation Age  of Onset  . Allergies Mother   . Coronary artery disease Mother   . Allergy (severe) Mother   . Allergies Father   . Stroke Father   . Heart attack Father 81  . Asthma Father   . Allergy (severe) Father   . Allergies Sister   . Allergy (severe) Sister   . Allergies Brother     x2  . Allergy (severe) Brother   . Diabetes Brother   . Allergy (severe) Brother   . COPD Neg Hx   . Cancer Son     Social History History  Substance Use Topics  . Smoking status: Former Smoker -- 3.00 packs/day    Types: Cigarettes    Quit date: 02/19/1961  . Smokeless tobacco: Never Used     Comment: started at age 64  . Alcohol Use: No    Current Outpatient Prescriptions  Medication Sig Dispense Refill  . busPIRone (BUSPAR) 15 MG tablet Take 7.5 mg by mouth 2 (two) times daily.      . calcium citrate-vitamin D (CITRACAL+D) 315-200 MG-UNIT per tablet Take 1 tablet by mouth at bedtime.      . cetirizine (ZYRTEC) 10 MG tablet Take 10 mg by mouth daily as needed for allergies.       . Cholecalciferol (VITAMIN D3) 2000 UNITS TABS Take 1,000 Units  by mouth daily.       Marland Kitchen diltiazem (CARDIZEM CD) 180 MG 24 hr capsule Take 1 capsule (180 mg total) by mouth daily.  30 capsule  5  . diphenhydrAMINE (BENADRYL) 25 mg capsule Take 25 mg by mouth every 4 (four) hours as needed for allergies.      . ferrous sulfate 325 (65 FE) MG tablet Take 325 mg by mouth daily with breakfast.      . Fish Oil-Cholecalciferol (FISH OIL + D3 PO) Take 1 tablet by mouth daily. Fish oil 1200 + D3 2000 units      . fluticasone (FLONASE) 50 MCG/ACT nasal spray Place 1 spray into the nose 2 (two) times daily as needed for rhinitis.  16 g  2  . ketotifen (ZADITOR) 0.025 % ophthalmic solution Place 1 drop into both eyes daily.       . Lidocaine HCl 4 % SOLN Apply 4 mLs topically as needed (for migraines).       . Mag Aspart-Potassium Aspart (POTASSIUM & MAGNESIUM ASPARTAT PO) Take 2 tablets by mouth daily.      . Multiple Vitamin  (MULTIVITAMIN) tablet Take 1 tablet by mouth daily.       Marland Kitchen OVER THE COUNTER MEDICATION Take 1 tablet by mouth at bedtime as needed (for leg cramps). Legatrin PM      . polyethylene glycol powder (MIRALAX) powder Take 17 g by mouth daily.      . pravastatin (PRAVACHOL) 40 MG tablet Take 40 mg by mouth at bedtime.       . ranitidine (ZANTAC) 150 MG tablet Take 150 mg by mouth 2 (two) times daily.      . Rivaroxaban (XARELTO) 20 MG TABS Take 1 tablet (20 mg total) by mouth daily with supper.  30 tablet  6  . SF 5000 PLUS 1.1 % CREA dental cream Place 1 application onto teeth at bedtime. As directed      . sildenafil (VIAGRA) 100 MG tablet Take 50-100 mg by mouth daily as needed for erectile dysfunction.      . topiramate (TOPAMAX) 25 MG tablet Take 75 mg by mouth daily.      . nitroGLYCERIN (NITROSTAT) 0.4 MG SL tablet Place 1 tablet (0.4 mg total) under the tongue every 5 (five) minutes as needed for chest pain.  90 tablet  3   No current facility-administered medications for this visit.    Allergies  Allergen Reactions  . Ivp Dye [Iodinated Diagnostic Agents] Hives    Review of Systems mild exertional angina and dyspnea, no PND edema or orthopnea no productive cough  BP 138/72  Pulse 74  Resp 20  Ht 6\' 1"  (1.854 m)  Wt 154 lb (69.854 kg)  BMI 20.32 kg/m2  SpO2 95% Physical Exam Alert and comfortable HEENT normocephalic Lungs clear but distant Cardiac with regular rhythm and soft systolic murmur Abdomen without edema or tenderness Extremities warm with pulses, surgical scar at left knee from popliteal embolectomy  Diagnostic Tests: Echocardiogram right heart cath and PFT data reviewed in patient.  Impression: Dramatic AS and three-vessel CAD. Plan aVR with bioprosthetic valve and multivessel CABG December 29. Procedure risks and benefits discussed with patient and wife.  Plan: AVR-CABG December 29 at [facility]

## 2013-02-02 ENCOUNTER — Other Ambulatory Visit: Payer: Self-pay | Admitting: Internal Medicine

## 2013-02-03 NOTE — Telephone Encounter (Signed)
Med filled.  

## 2013-02-08 ENCOUNTER — Other Ambulatory Visit: Payer: Self-pay | Admitting: Internal Medicine

## 2013-02-09 NOTE — Pre-Procedure Instructions (Signed)
Robert Richard  02/09/2013   Your procedure is scheduled on:  02/16/13  Report to [facility]  2 * 3 at 530 AM.  Call this number if you have problems the morning of surgery: [phone]   Remember:   Do not eat food or drink liquids after midnight.   Take these medicines the morning of surgery with A SIP OF WATER: buspar,diltizem,flonase,zantac,   Do not wear jewelry, make-up or nail polish.  Do not wear lotions, powders, or perfumes. You may wear deodorant.  Do not shave 48 hours prior to surgery. Men may shave face and neck.  Do not bring valuables to the hospital.  [facility] is not responsible                  for any belongings or valuables.               Contacts, dentures or bridgework may not be worn into surgery.  Leave suitcase in the car. After surgery it may be brought to your room.  For patients admitted to the hospital, discharge time is determined by your                treatment team.               Patients discharged the day of surgery will not be allowed to drive  home.  Name and phone number of your driver: family  Special Instructions: Shower using CHG 2 nights before surgery and the night before surgery.  If you shower the day of surgery use CHG.  Use special wash - you have one bottle of CHG for all showers.  You should use approximately 1/3 of the bottle for each shower.   Please read over the following fact sheets that you were given: Pain Booklet, Coughing and Deep Breathing, Blood Transfusion Information, Open Heart Packet, MRSA Information and Surgical Site Infection Prevention

## 2013-02-10 ENCOUNTER — Ambulatory Visit (HOSPITAL_COMMUNITY)
Admission: RE | Admit: 2013-02-10 | Discharge: 2013-02-10 | Disposition: A | Discharge status: Home / Self Care | Payer: Medicare Other | Source: Ambulatory Visit | Attending: Cardiothoracic Surgery | Admitting: Cardiothoracic Surgery

## 2013-02-10 ENCOUNTER — Encounter (HOSPITAL_COMMUNITY)
Admission: RE | Admit: 2013-02-10 | Discharge: 2013-02-10 | Disposition: A | Discharge status: Home / Self Care | Payer: Medicare Other | Source: Ambulatory Visit | Attending: Cardiothoracic Surgery | Admitting: Cardiothoracic Surgery

## 2013-02-10 ENCOUNTER — Encounter (HOSPITAL_COMMUNITY): Payer: Self-pay

## 2013-02-10 VITALS — BP 139/74 | HR 60 | Temp 97.8°F | Resp 20 | Ht 73.0 in | Wt 152.7 lb

## 2013-02-10 DIAGNOSIS — Z01810 Encounter for preprocedural cardiovascular examination: Secondary | ICD-10-CM | POA: Insufficient documentation

## 2013-02-10 DIAGNOSIS — Z01818 Encounter for other preprocedural examination: Secondary | ICD-10-CM | POA: Insufficient documentation

## 2013-02-10 DIAGNOSIS — I2510 Atherosclerotic heart disease of native coronary artery without angina pectoris: Secondary | ICD-10-CM

## 2013-02-10 DIAGNOSIS — I350 Nonrheumatic aortic (valve) stenosis: Secondary | ICD-10-CM

## 2013-02-10 DIAGNOSIS — I359 Nonrheumatic aortic valve disorder, unspecified: Secondary | ICD-10-CM | POA: Insufficient documentation

## 2013-02-10 DIAGNOSIS — Z01812 Encounter for preprocedural laboratory examination: Secondary | ICD-10-CM | POA: Insufficient documentation

## 2013-02-10 HISTORY — DX: Restless legs syndrome: G25.81

## 2013-02-10 HISTORY — DX: Headache: R51

## 2013-02-10 LAB — CBC
HCT: 36 % — ABNORMAL LOW (ref 39.0–52.0)
Hemoglobin: 11.9 g/dL — ABNORMAL LOW (ref 13.0–17.0)
MCH: 28.5 pg (ref 26.0–34.0)
MCHC: 33.1 g/dL (ref 30.0–36.0)
MCV: 86.1 fL (ref 78.0–100.0)
Platelets: 214 10*3/uL (ref 150–400)
RBC: 4.18 MIL/uL — ABNORMAL LOW (ref 4.22–5.81)
RDW: 13.2 % (ref 11.5–15.5)
WBC: 8.2 10*3/uL (ref 4.0–10.5)

## 2013-02-10 LAB — COMPREHENSIVE METABOLIC PANEL
ALT: 12 U/L (ref 0–53)
AST: 22 U/L (ref 0–37)
Albumin: 4 g/dL (ref 3.5–5.2)
Alkaline Phosphatase: 81 U/L (ref 39–117)
BUN: 19 mg/dL (ref 6–23)
CO2: 17 mEq/L — ABNORMAL LOW (ref 19–32)
Calcium: 9.7 mg/dL (ref 8.4–10.5)
Chloride: 108 mEq/L (ref 96–112)
Creatinine, Ser: 1.22 mg/dL (ref 0.50–1.35)
GFR calc Af Amer: 63 mL/min — ABNORMAL LOW (ref >90)
GFR calc non Af Amer: 54 mL/min — ABNORMAL LOW (ref >90)
Glucose, Bld: 103 mg/dL — ABNORMAL HIGH (ref 70–99)
Potassium: 4.3 mEq/L (ref 3.5–5.1)
Sodium: 139 mEq/L (ref 135–145)
Total Bilirubin: 0.4 mg/dL (ref 0.3–1.2)
Total Protein: 8.3 g/dL (ref 6.0–8.3)

## 2013-02-10 LAB — BLOOD GAS, ARTERIAL
Acid-base deficit: 6.3 mmol/L — ABNORMAL HIGH (ref 0.0–2.0)
Bicarbonate: 17.8 mEq/L — ABNORMAL LOW (ref 20.0–24.0)
Drawn by: 18160
FIO2: 0.21 %
O2 Saturation: 98.3 %
Patient temperature: 98.6
TCO2: 18.7 mmol/L (ref 0–100)
pCO2 arterial: 30.4 mmHg — ABNORMAL LOW (ref 35.0–45.0)
pH, Arterial: 7.385 (ref 7.350–7.450)
pO2, Arterial: 103 mmHg — ABNORMAL HIGH (ref 80.0–100.0)

## 2013-02-10 LAB — HEMOGLOBIN A1C
Hgb A1c MFr Bld: 6.3 % — ABNORMAL HIGH (ref <5.7)
Mean Plasma Glucose: 134 mg/dL — ABNORMAL HIGH (ref <117)

## 2013-02-10 LAB — APTT: aPTT: 37 seconds (ref 24–37)

## 2013-02-10 LAB — SURGICAL PCR SCREEN
MRSA, PCR: NEGATIVE
Staphylococcus aureus: NEGATIVE

## 2013-02-10 LAB — PROTIME-INR
INR: 1.52 — ABNORMAL HIGH (ref 0.00–1.49)
Prothrombin Time: 17.9 seconds — ABNORMAL HIGH (ref 11.6–15.2)

## 2013-02-10 MED ORDER — SODIUM CHLORIDE 0.9 % IJ SOLN: 3.0000 mL | Freq: Two times a day (BID) | INTRAMUSCULAR | Status: DC

## 2013-02-10 MED ORDER — SODIUM CHLORIDE 0.9 % IJ SOLN: 3.0000 mL | INTRAMUSCULAR | Status: DC | PRN

## 2013-02-10 MED ORDER — SODIUM CHLORIDE 0.9 % IV SOLN: 250.0000 mL | INTRAVENOUS | Status: DC | PRN

## 2013-02-10 MED ORDER — CHLORHEXIDINE GLUCONATE 4 % EX LIQD: 30.0000 mL | CUTANEOUS | Status: DC

## 2013-02-10 NOTE — Progress Notes (Signed)
VASCULAR LAB PRELIMINARY  PRELIMINARY  PRELIMINARY  PRELIMINARY  Pre-op Cardiac Surgery  Carotid Findings:  Bilateral:  1-39% ICA stenosis.  Vertebral artery flow is antegrade.     Upper Extremity Right Left  Brachial Pressures 113 Triphasic 119 Triphasic  Radial Waveforms Biphasic Triphasic  Ulnar Waveforms Biphasic Triphasic  Palmar Arch (Allen's Test) Normal Normal   Findings:   Doppler waveforms remained normal bilaterally with both radial and ulnar compressions   Lower  Extremity Right Left  Dorsalis Pedis 115 Biphasic 163 Biphasic  Posterior Tibial 186 Biphasic 213 Biphasic  Ankle/Brachial Indices 1.56 1.76    Findings:  ABIs indicate a possible early stage of calcification with normal arterial Doppler signals. Right Dorsalis Pedis pressure may not be completely accurate due to constant movement of the foot cause by restless leg syndrome.   Brennin Durfee, RVS 02/10/2013, 12:27 PM

## 2013-02-10 NOTE — Telephone Encounter (Signed)
Diltiazem refilled per protocol. JG//CMA 

## 2013-02-15 MED ORDER — VANCOMYCIN HCL 10 G IV SOLR
1250.0000 mg | INTRAVENOUS | Status: DC
  Filled 2013-02-15: qty 1250

## 2013-02-15 MED ORDER — DEXMEDETOMIDINE HCL IN NACL 400 MCG/100ML IV SOLN
0.1000 ug/kg/h | INTRAVENOUS | Status: DC
  Filled 2013-02-15: qty 100

## 2013-02-15 MED ORDER — DOPAMINE-DEXTROSE 3.2-5 MG/ML-% IV SOLN
2.0000 ug/kg/min | INTRAVENOUS | Status: DC
  Filled 2013-02-15: qty 250

## 2013-02-15 MED ORDER — SODIUM CHLORIDE 0.9 % IV SOLN
INTRAVENOUS | Status: DC
  Filled 2013-02-15: qty 1

## 2013-02-15 MED ORDER — MAGNESIUM SULFATE 50 % IJ SOLN
40.0000 meq | INTRAMUSCULAR | Status: DC
  Filled 2013-02-15: qty 10

## 2013-02-15 MED ORDER — PLASMA-LYTE 148 IV SOLN
INTRAVENOUS | Status: DC
  Filled 2013-02-15: qty 2.5

## 2013-02-15 MED ORDER — SODIUM CHLORIDE 0.9 % IV SOLN
INTRAVENOUS | Status: DC
  Filled 2013-02-15: qty 30

## 2013-02-15 MED ORDER — NITROGLYCERIN IN D5W 200-5 MCG/ML-% IV SOLN
2.0000 ug/min | INTRAVENOUS | Status: DC
  Filled 2013-02-15: qty 250

## 2013-02-15 MED ORDER — SODIUM CHLORIDE 0.9 % IV SOLN
INTRAVENOUS | Status: DC
  Filled 2013-02-15: qty 40

## 2013-02-15 MED ORDER — PHENYLEPHRINE HCL 10 MG/ML IJ SOLN
30.0000 ug/min | INTRAVENOUS | Status: DC
  Filled 2013-02-15: qty 2

## 2013-02-15 MED ORDER — EPINEPHRINE HCL 1 MG/ML IJ SOLN
0.5000 ug/min | INTRAVENOUS | Status: DC
  Filled 2013-02-15: qty 4

## 2013-02-15 MED ORDER — METOPROLOL TARTRATE 12.5 MG HALF TABLET: 12.5000 mg | ORAL_TABLET | Freq: Once | ORAL | Status: DC

## 2013-02-15 MED ORDER — DEXTROSE 5 % IV SOLN
750.0000 mg | INTRAVENOUS | Status: DC
  Filled 2013-02-15: qty 750

## 2013-02-15 MED ORDER — POTASSIUM CHLORIDE 2 MEQ/ML IV SOLN
80.0000 meq | INTRAVENOUS | Status: DC
  Filled 2013-02-15: qty 40

## 2013-02-15 MED ORDER — DEXTROSE 5 % IV SOLN
1.5000 g | INTRAVENOUS | Status: DC
  Filled 2013-02-15: qty 1.5

## 2013-02-16 ENCOUNTER — Encounter (HOSPITAL_COMMUNITY): Payer: Self-pay | Admitting: Pharmacy Technician

## 2013-02-16 ENCOUNTER — Encounter (HOSPITAL_COMMUNITY): Admission: RE | Payer: Self-pay | Source: Ambulatory Visit

## 2013-02-16 ENCOUNTER — Inpatient Hospital Stay (HOSPITAL_COMMUNITY): Admission: RE | Admit: 2013-02-16 | Payer: Medicare Other | Source: Ambulatory Visit | Admitting: Cardiothoracic Surgery

## 2013-02-16 ENCOUNTER — Other Ambulatory Visit: Payer: Self-pay | Admitting: *Deleted

## 2013-02-16 ENCOUNTER — Telehealth: Payer: Self-pay | Admitting: *Deleted

## 2013-02-16 DIAGNOSIS — I359 Nonrheumatic aortic valve disorder, unspecified: Secondary | ICD-10-CM

## 2013-02-16 DIAGNOSIS — I2510 Atherosclerotic heart disease of native coronary artery without angina pectoris: Secondary | ICD-10-CM

## 2013-02-16 SURGERY — CORONARY ARTERY BYPASS GRAFTING (CABG)
Anesthesia: General | Site: Chest

## 2013-02-16 NOTE — Telephone Encounter (Signed)
Patient's surgery rescheduled for Friday 02/20/13 per Dr. Donata Clay.  Short stay made aware of new surgery date.  They said no need to re-draw pre-op labs since it is still in the 14 day range.  Patient made aware of changes.  Told patient to stay off of Xarelto.

## 2013-02-17 NOTE — Progress Notes (Signed)
Spoke with pt.'s wife, reinforced pt. Arrival time on day of surgery 02/20/2013 at 0530

## 2013-02-19 MED ORDER — PLASMA-LYTE 148 IV SOLN
INTRAVENOUS | Status: AC
  Administered 2013-02-20: 07:00:00
  Filled 2013-02-19: qty 2.5

## 2013-02-19 MED ORDER — SODIUM CHLORIDE 0.9 % IV SOLN
INTRAVENOUS | Status: DC
  Filled 2013-02-19: qty 30

## 2013-02-19 MED ORDER — POTASSIUM CHLORIDE 2 MEQ/ML IV SOLN
80.0000 meq | INTRAVENOUS | Status: DC
  Filled 2013-02-19: qty 40

## 2013-02-19 MED ORDER — SODIUM CHLORIDE 0.9 % IV SOLN
INTRAVENOUS | Status: AC
  Administered 2013-02-20: 1.3 [IU]/h via INTRAVENOUS
  Filled 2013-02-19: qty 1

## 2013-02-19 MED ORDER — PHENYLEPHRINE HCL 10 MG/ML IJ SOLN
30.0000 ug/min | INTRAVENOUS | Status: AC
  Administered 2013-02-20: 25 ug/min via INTRAVENOUS
  Filled 2013-02-19: qty 2

## 2013-02-19 MED ORDER — NITROGLYCERIN IN D5W 200-5 MCG/ML-% IV SOLN
2.0000 ug/min | INTRAVENOUS | Status: DC
  Filled 2013-02-19: qty 250

## 2013-02-19 MED ORDER — SODIUM CHLORIDE 0.9 % IV SOLN
1250.0000 mg | INTRAVENOUS | Status: AC
  Administered 2013-02-20: 1250 mg via INTRAVENOUS
  Filled 2013-02-19: qty 1250

## 2013-02-19 MED ORDER — EPINEPHRINE HCL 1 MG/ML IJ SOLN
0.5000 ug/min | INTRAVENOUS | Status: DC
  Filled 2013-02-19: qty 4

## 2013-02-19 MED ORDER — CHLORHEXIDINE GLUCONATE 4 % EX LIQD: 30.0000 mL | CUTANEOUS | Status: DC

## 2013-02-19 MED ORDER — DEXTROSE 5 % IV SOLN
1.5000 g | INTRAVENOUS | Status: AC
  Administered 2013-02-20: 1.5 g via INTRAVENOUS
  Administered 2013-02-20: .75 g via INTRAVENOUS
  Filled 2013-02-19: qty 1.5

## 2013-02-19 MED ORDER — DOPAMINE-DEXTROSE 3.2-5 MG/ML-% IV SOLN
2.0000 ug/kg/min | INTRAVENOUS | Status: DC
  Filled 2013-02-19: qty 250

## 2013-02-19 MED ORDER — SODIUM CHLORIDE 0.9 % IV SOLN
INTRAVENOUS | Status: AC
  Administered 2013-02-20: 14 mL/h via INTRAVENOUS
  Filled 2013-02-19: qty 40

## 2013-02-19 MED ORDER — DEXMEDETOMIDINE HCL IN NACL 400 MCG/100ML IV SOLN
0.1000 ug/kg/h | INTRAVENOUS | Status: AC
  Administered 2013-02-20: 0.2 ug/kg/h via INTRAVENOUS
  Filled 2013-02-19: qty 100

## 2013-02-19 MED ORDER — DEXTROSE 5 % IV SOLN
750.0000 mg | INTRAVENOUS | Status: DC
  Filled 2013-02-19: qty 750

## 2013-02-19 MED ORDER — MAGNESIUM SULFATE 50 % IJ SOLN
40.0000 meq | INTRAMUSCULAR | Status: DC
  Filled 2013-02-19: qty 10

## 2013-02-19 MED ORDER — METOPROLOL TARTRATE 12.5 MG HALF TABLET
12.5000 mg | ORAL_TABLET | Freq: Once | ORAL | Status: AC
  Administered 2013-02-20: 12.5 mg via ORAL
  Filled 2013-02-19: qty 1

## 2013-02-20 ENCOUNTER — Encounter (HOSPITAL_COMMUNITY)
Admission: RE | Disposition: A | Discharge status: Home / Self Care | Payer: Medicare Other | Source: Ambulatory Visit | Attending: Cardiothoracic Surgery

## 2013-02-20 ENCOUNTER — Encounter (HOSPITAL_COMMUNITY): Payer: Medicare Other | Admitting: Anesthesiology

## 2013-02-20 ENCOUNTER — Inpatient Hospital Stay (HOSPITAL_COMMUNITY)
Admission: RE | Admit: 2013-02-20 | Discharge: 2013-03-09 | DRG: 219 | Disposition: A | Discharge status: Home / Self Care | Payer: Medicare Other | Source: Ambulatory Visit | Attending: Cardiothoracic Surgery | Admitting: Cardiothoracic Surgery

## 2013-02-20 ENCOUNTER — Encounter (HOSPITAL_COMMUNITY): Payer: Self-pay | Admitting: *Deleted

## 2013-02-20 ENCOUNTER — Inpatient Hospital Stay (HOSPITAL_COMMUNITY): Payer: Medicare Other | Admitting: Anesthesiology

## 2013-02-20 ENCOUNTER — Inpatient Hospital Stay (HOSPITAL_COMMUNITY): Payer: Medicare Other

## 2013-02-20 DIAGNOSIS — D696 Thrombocytopenia, unspecified: Secondary | ICD-10-CM | POA: Diagnosis present

## 2013-02-20 DIAGNOSIS — R4789 Other speech disturbances: Secondary | ICD-10-CM | POA: Diagnosis not present

## 2013-02-20 DIAGNOSIS — I10 Essential (primary) hypertension: Secondary | ICD-10-CM | POA: Diagnosis present

## 2013-02-20 DIAGNOSIS — R29810 Facial weakness: Secondary | ICD-10-CM | POA: Diagnosis not present

## 2013-02-20 DIAGNOSIS — K1370 Unspecified lesions of oral mucosa: Secondary | ICD-10-CM | POA: Diagnosis not present

## 2013-02-20 DIAGNOSIS — D62 Acute posthemorrhagic anemia: Secondary | ICD-10-CM | POA: Diagnosis not present

## 2013-02-20 DIAGNOSIS — R32 Unspecified urinary incontinence: Secondary | ICD-10-CM | POA: Diagnosis not present

## 2013-02-20 DIAGNOSIS — I2510 Atherosclerotic heart disease of native coronary artery without angina pectoris: Principal | ICD-10-CM | POA: Diagnosis present

## 2013-02-20 DIAGNOSIS — J4489 Other specified chronic obstructive pulmonary disease: Secondary | ICD-10-CM | POA: Diagnosis present

## 2013-02-20 DIAGNOSIS — E785 Hyperlipidemia, unspecified: Secondary | ICD-10-CM | POA: Diagnosis present

## 2013-02-20 DIAGNOSIS — J449 Chronic obstructive pulmonary disease, unspecified: Secondary | ICD-10-CM | POA: Diagnosis present

## 2013-02-20 DIAGNOSIS — J1100 Influenza due to unidentified influenza virus with unspecified type of pneumonia: Secondary | ICD-10-CM | POA: Diagnosis present

## 2013-02-20 DIAGNOSIS — Z87891 Personal history of nicotine dependence: Secondary | ICD-10-CM

## 2013-02-20 DIAGNOSIS — I519 Heart disease, unspecified: Secondary | ICD-10-CM | POA: Diagnosis present

## 2013-02-20 DIAGNOSIS — J029 Acute pharyngitis, unspecified: Secondary | ICD-10-CM | POA: Diagnosis not present

## 2013-02-20 DIAGNOSIS — I4891 Unspecified atrial fibrillation: Secondary | ICD-10-CM | POA: Diagnosis present

## 2013-02-20 DIAGNOSIS — F99 Mental disorder, not otherwise specified: Secondary | ICD-10-CM | POA: Diagnosis not present

## 2013-02-20 DIAGNOSIS — Z951 Presence of aortocoronary bypass graft: Secondary | ICD-10-CM

## 2013-02-20 DIAGNOSIS — Z7901 Long term (current) use of anticoagulants: Secondary | ICD-10-CM

## 2013-02-20 DIAGNOSIS — I359 Nonrheumatic aortic valve disorder, unspecified: Secondary | ICD-10-CM | POA: Diagnosis present

## 2013-02-20 DIAGNOSIS — Z8673 Personal history of transient ischemic attack (TIA), and cerebral infarction without residual deficits: Secondary | ICD-10-CM

## 2013-02-20 DIAGNOSIS — E43 Unspecified severe protein-calorie malnutrition: Secondary | ICD-10-CM | POA: Insufficient documentation

## 2013-02-20 HISTORY — PX: CORONARY ARTERY BYPASS GRAFT: SHX141

## 2013-02-20 HISTORY — PX: INTRAOPERATIVE TRANSESOPHAGEAL ECHOCARDIOGRAM: SHX5062

## 2013-02-20 HISTORY — PX: AORTIC VALVE REPLACEMENT: SHX41

## 2013-02-20 LAB — POCT I-STAT 4, (NA,K, GLUC, HGB,HCT)
Glucose, Bld: 104 mg/dL — ABNORMAL HIGH (ref 70–99)
Glucose, Bld: 95 mg/dL (ref 70–99)
Glucose, Bld: 98 mg/dL (ref 70–99)
Glucose, Bld: 121 mg/dL — ABNORMAL HIGH (ref 70–99)
Glucose, Bld: 160 mg/dL — ABNORMAL HIGH (ref 70–99)
Glucose, Bld: 150 mg/dL — ABNORMAL HIGH (ref 70–99)
Glucose, Bld: 120 mg/dL — ABNORMAL HIGH (ref 70–99)
HCT: 30 % — ABNORMAL LOW (ref 39.0–52.0)
HCT: 24 % — ABNORMAL LOW (ref 39.0–52.0)
HCT: 28 % — ABNORMAL LOW (ref 39.0–52.0)
HCT: 25 % — ABNORMAL LOW (ref 39.0–52.0)
HCT: 26 % — ABNORMAL LOW (ref 39.0–52.0)
HCT: 27 % — ABNORMAL LOW (ref 39.0–52.0)
HCT: 28 % — ABNORMAL LOW (ref 39.0–52.0)
Hemoglobin: 10.2 g/dL — ABNORMAL LOW (ref 13.0–17.0)
Hemoglobin: 8.2 g/dL — ABNORMAL LOW (ref 13.0–17.0)
Hemoglobin: 9.5 g/dL — ABNORMAL LOW (ref 13.0–17.0)
Hemoglobin: 8.5 g/dL — ABNORMAL LOW (ref 13.0–17.0)
Hemoglobin: 8.8 g/dL — ABNORMAL LOW (ref 13.0–17.0)
Hemoglobin: 9.2 g/dL — ABNORMAL LOW (ref 13.0–17.0)
Hemoglobin: 9.5 g/dL — ABNORMAL LOW (ref 13.0–17.0)
Potassium: 4.5 mEq/L (ref 3.7–5.3)
Potassium: 4 mEq/L (ref 3.7–5.3)
Potassium: 4.2 mEq/L (ref 3.7–5.3)
Potassium: 4.4 mEq/L (ref 3.7–5.3)
Potassium: 4.2 mEq/L (ref 3.7–5.3)
Potassium: 4.2 mEq/L (ref 3.7–5.3)
Potassium: 3.6 mEq/L — ABNORMAL LOW (ref 3.7–5.3)
Sodium: 141 mEq/L (ref 137–147)
Sodium: 139 mEq/L (ref 137–147)
Sodium: 142 mEq/L (ref 137–147)
Sodium: 139 mEq/L (ref 137–147)
Sodium: 140 mEq/L (ref 137–147)
Sodium: 141 mEq/L (ref 137–147)
Sodium: 143 mEq/L (ref 137–147)

## 2013-02-20 LAB — POCT I-STAT 3, ART BLOOD GAS (G3+)
Acid-base deficit: 2 mmol/L (ref 0.0–2.0)
Acid-base deficit: 7 mmol/L — ABNORMAL HIGH (ref 0.0–2.0)
Acid-base deficit: 5 mmol/L — ABNORMAL HIGH (ref 0.0–2.0)
Acid-base deficit: 4 mmol/L — ABNORMAL HIGH (ref 0.0–2.0)
Acid-base deficit: 5 mmol/L — ABNORMAL HIGH (ref 0.0–2.0)
Bicarbonate: 22.8 mEq/L (ref 20.0–24.0)
Bicarbonate: 18.6 mEq/L — ABNORMAL LOW (ref 20.0–24.0)
Bicarbonate: 22.4 mEq/L (ref 20.0–24.0)
Bicarbonate: 20.5 mEq/L (ref 20.0–24.0)
Bicarbonate: 20.5 mEq/L (ref 20.0–24.0)
O2 Saturation: 100 %
O2 Saturation: 99 %
O2 Saturation: 93 %
O2 Saturation: 99 %
O2 Saturation: 97 %
Patient temperature: 36.3
Patient temperature: 36.6
Patient temperature: 37.2
TCO2: 24 mmol/L (ref 0–100)
TCO2: 20 mmol/L (ref 0–100)
TCO2: 24 mmol/L (ref 0–100)
TCO2: 21 mmol/L (ref 0–100)
TCO2: 22 mmol/L (ref 0–100)
pCO2 arterial: 39.6 mmHg (ref 35.0–45.0)
pCO2 arterial: 37.7 mmHg (ref 35.0–45.0)
pCO2 arterial: 47.8 mmHg — ABNORMAL HIGH (ref 35.0–45.0)
pCO2 arterial: 32.9 mmHg — ABNORMAL LOW (ref 35.0–45.0)
pCO2 arterial: 39.2 mmHg (ref 35.0–45.0)
pH, Arterial: 7.369 (ref 7.350–7.450)
pH, Arterial: 7.301 — ABNORMAL LOW (ref 7.350–7.450)
pH, Arterial: 7.275 — ABNORMAL LOW (ref 7.350–7.450)
pH, Arterial: 7.4 (ref 7.350–7.450)
pH, Arterial: 7.327 — ABNORMAL LOW (ref 7.350–7.450)
pO2, Arterial: 353 mmHg — ABNORMAL HIGH (ref 80.0–100.0)
pO2, Arterial: 163 mmHg — ABNORMAL HIGH (ref 80.0–100.0)
pO2, Arterial: 72 mmHg — ABNORMAL LOW (ref 80.0–100.0)
pO2, Arterial: 126 mmHg — ABNORMAL HIGH (ref 80.0–100.0)
pO2, Arterial: 97 mmHg (ref 80.0–100.0)

## 2013-02-20 LAB — URINALYSIS, ROUTINE W REFLEX MICROSCOPIC
Bilirubin Urine: NEGATIVE
Glucose, UA: NEGATIVE mg/dL
Hgb urine dipstick: NEGATIVE
Ketones, ur: NEGATIVE mg/dL
Leukocytes, UA: NEGATIVE
Nitrite: NEGATIVE
Protein, ur: NEGATIVE mg/dL
Specific Gravity, Urine: 1.017 (ref 1.005–1.030)
Urobilinogen, UA: 0.2 mg/dL (ref 0.0–1.0)
pH: 7 (ref 5.0–8.0)

## 2013-02-20 LAB — CBC
HCT: 27.4 % — ABNORMAL LOW (ref 39.0–52.0)
HCT: 29.1 % — ABNORMAL LOW (ref 39.0–52.0)
Hemoglobin: 9.3 g/dL — ABNORMAL LOW (ref 13.0–17.0)
Hemoglobin: 9.7 g/dL — ABNORMAL LOW (ref 13.0–17.0)
MCH: 29.5 pg (ref 26.0–34.0)
MCH: 29.1 pg (ref 26.0–34.0)
MCHC: 33.9 g/dL (ref 30.0–36.0)
MCHC: 33.3 g/dL (ref 30.0–36.0)
MCV: 87 fL (ref 78.0–100.0)
MCV: 87.4 fL (ref 78.0–100.0)
Platelets: 106 10*3/uL — ABNORMAL LOW (ref 150–400)
Platelets: 132 10*3/uL — ABNORMAL LOW (ref 150–400)
RBC: 3.15 MIL/uL — ABNORMAL LOW (ref 4.22–5.81)
RBC: 3.33 MIL/uL — ABNORMAL LOW (ref 4.22–5.81)
RDW: 13.3 % (ref 11.5–15.5)
RDW: 13.2 % (ref 11.5–15.5)
WBC: 14 10*3/uL — ABNORMAL HIGH (ref 4.0–10.5)
WBC: 17.4 10*3/uL — ABNORMAL HIGH (ref 4.0–10.5)

## 2013-02-20 LAB — POCT I-STAT GLUCOSE
Glucose, Bld: 102 mg/dL — ABNORMAL HIGH (ref 70–99)
Operator id: 195151

## 2013-02-20 LAB — CREATININE, SERUM
Creatinine, Ser: 1.07 mg/dL (ref 0.50–1.35)
GFR calc Af Amer: 74 mL/min — ABNORMAL LOW (ref >90)
GFR calc non Af Amer: 64 mL/min — ABNORMAL LOW (ref >90)

## 2013-02-20 LAB — HEMOGLOBIN AND HEMATOCRIT, BLOOD
HCT: 23.7 % — ABNORMAL LOW (ref 39.0–52.0)
Hemoglobin: 8.3 g/dL — ABNORMAL LOW (ref 13.0–17.0)

## 2013-02-20 LAB — PROTIME-INR
INR: 1.51 — ABNORMAL HIGH (ref 0.00–1.49)
Prothrombin Time: 17.8 seconds — ABNORMAL HIGH (ref 11.6–15.2)

## 2013-02-20 LAB — APTT: aPTT: 37 seconds (ref 24–37)

## 2013-02-20 LAB — PLATELET COUNT: Platelets: 124 10*3/uL — ABNORMAL LOW (ref 150–400)

## 2013-02-20 LAB — MAGNESIUM: Magnesium: 3 mg/dL — ABNORMAL HIGH (ref 1.5–2.5)

## 2013-02-20 SURGERY — CORONARY ARTERY BYPASS GRAFTING (CABG)
Anesthesia: General | Site: Chest

## 2013-02-20 MED ORDER — SODIUM CHLORIDE 0.9 % IV SOLN: INTRAVENOUS | Status: DC

## 2013-02-20 MED ORDER — LORATADINE 10 MG PO TABS
10.0000 mg | ORAL_TABLET | Freq: Every day | ORAL | Status: DC
  Administered 2013-02-21 – 2013-03-08 (×16): 10 mg via ORAL
  Filled 2013-02-20 (×17): qty 1

## 2013-02-20 MED ORDER — MORPHINE SULFATE 2 MG/ML IJ SOLN
2.0000 mg | INTRAMUSCULAR | Status: DC | PRN
  Administered 2013-02-21: 2 mg via INTRAVENOUS

## 2013-02-20 MED ORDER — SODIUM CHLORIDE 0.9 % IV SOLN: 250.0000 mL | INTRAVENOUS | Status: DC

## 2013-02-20 MED ORDER — CALCIUM CITRATE 950 (200 CA) MG PO TABS
200.0000 mg | ORAL_TABLET | Freq: Every day | ORAL | Status: DC
  Administered 2013-02-21 – 2013-03-09 (×16): 200 mg via ORAL
  Filled 2013-02-20 (×17): qty 1

## 2013-02-20 MED ORDER — FAMOTIDINE IN NACL 20-0.9 MG/50ML-% IV SOLN
20.0000 mg | Freq: Two times a day (BID) | INTRAVENOUS | Status: AC
  Administered 2013-02-20: 20 mg via INTRAVENOUS
  Filled 2013-02-20: qty 50

## 2013-02-20 MED ORDER — SODIUM FLUORIDE 1.1 % DT CREA: 1.0000 "application " | TOPICAL_CREAM | Freq: Every day | DENTAL | Status: DC

## 2013-02-20 MED ORDER — TOPIRAMATE 25 MG PO TABS
75.0000 mg | ORAL_TABLET | Freq: Every day | ORAL | Status: DC
  Administered 2013-02-21 – 2013-03-09 (×16): 75 mg via ORAL
  Filled 2013-02-20 (×17): qty 3

## 2013-02-20 MED ORDER — ARTIFICIAL TEARS OP OINT
TOPICAL_OINTMENT | OPHTHALMIC | Status: DC | PRN
  Administered 2013-02-20: 1 via OPHTHALMIC

## 2013-02-20 MED ORDER — POTASSIUM CHLORIDE 10 MEQ/50ML IV SOLN
10.0000 meq | Freq: Once | INTRAVENOUS | Status: AC
  Administered 2013-02-20: 10 meq via INTRAVENOUS

## 2013-02-20 MED ORDER — INSULIN REGULAR BOLUS VIA INFUSION
0.0000 [IU] | Freq: Three times a day (TID) | INTRAVENOUS | Status: DC
  Filled 2013-02-20: qty 10

## 2013-02-20 MED ORDER — ONE-DAILY MULTI VITAMINS PO TABS
1.0000 | ORAL_TABLET | Freq: Every day | ORAL | Status: DC
  Administered 2013-02-21: 1 via ORAL
  Filled 2013-02-20: qty 1

## 2013-02-20 MED ORDER — SODIUM CHLORIDE 0.9 % IJ SOLN
3.0000 mL | Freq: Two times a day (BID) | INTRAMUSCULAR | Status: DC
  Administered 2013-02-21 (×2): 3 mL via INTRAVENOUS
  Administered 2013-02-22: 6 mL via INTRAVENOUS
  Administered 2013-02-22 – 2013-02-26 (×4): 3 mL via INTRAVENOUS

## 2013-02-20 MED ORDER — METOCLOPRAMIDE HCL 5 MG/ML IJ SOLN
10.0000 mg | Freq: Four times a day (QID) | INTRAMUSCULAR | Status: AC
  Administered 2013-02-20 – 2013-02-21 (×3): 10 mg via INTRAVENOUS
  Filled 2013-02-20 (×4): qty 2

## 2013-02-20 MED ORDER — SODIUM CHLORIDE 0.45 % IV SOLN
INTRAVENOUS | Status: DC
  Administered 2013-02-20: 15:00:00 via INTRAVENOUS
  Administered 2013-02-22: 20 mL/h via INTRAVENOUS

## 2013-02-20 MED ORDER — DEXMEDETOMIDINE HCL IN NACL 200 MCG/50ML IV SOLN
0.1000 ug/kg/h | INTRAVENOUS | Status: DC
  Filled 2013-02-20: qty 50

## 2013-02-20 MED ORDER — HEPARIN SODIUM (PORCINE) 1000 UNIT/ML IJ SOLN
INTRAMUSCULAR | Status: DC | PRN
  Administered 2013-02-20: 16000 [IU] via INTRAVENOUS
  Administered 2013-02-20: 2000 [IU] via INTRAVENOUS
  Administered 2013-02-20: 3000 [IU] via INTRAVENOUS

## 2013-02-20 MED ORDER — DOCUSATE SODIUM 100 MG PO CAPS
200.0000 mg | ORAL_CAPSULE | Freq: Every day | ORAL | Status: DC
  Administered 2013-02-21 – 2013-03-06 (×12): 200 mg via ORAL
  Filled 2013-02-20 (×15): qty 2

## 2013-02-20 MED ORDER — NITROGLYCERIN IN D5W 200-5 MCG/ML-% IV SOLN
0.0000 ug/min | INTRAVENOUS | Status: DC
Start: 2013-02-20 — End: 2013-02-21

## 2013-02-20 MED ORDER — KETOTIFEN FUMARATE 0.025 % OP SOLN
1.0000 [drp] | Freq: Every day | OPHTHALMIC | Status: DC
  Administered 2013-02-22 – 2013-03-08 (×12): 1 [drp] via OPHTHALMIC
  Filled 2013-02-20 (×2): qty 5

## 2013-02-20 MED ORDER — ASPIRIN 81 MG PO CHEW: 324.0000 mg | CHEWABLE_TABLET | Freq: Every day | ORAL | Status: DC

## 2013-02-20 MED ORDER — BUSPIRONE HCL 15 MG PO TABS
7.5000 mg | ORAL_TABLET | Freq: Two times a day (BID) | ORAL | Status: DC
  Administered 2013-02-21 – 2013-03-09 (×33): 7.5 mg via ORAL
  Filled 2013-02-20 (×35): qty 1

## 2013-02-20 MED ORDER — MORPHINE SULFATE 2 MG/ML IJ SOLN: 1.0000 mg | INTRAMUSCULAR | Status: AC | PRN

## 2013-02-20 MED ORDER — DEXTROSE 5 % IV SOLN
1.5000 g | Freq: Two times a day (BID) | INTRAVENOUS | Status: AC
  Administered 2013-02-20 – 2013-02-22 (×4): 1.5 g via INTRAVENOUS
  Filled 2013-02-20 (×4): qty 1.5

## 2013-02-20 MED ORDER — ACETAMINOPHEN 650 MG RE SUPP
650.0000 mg | Freq: Once | RECTAL | Status: AC
  Administered 2013-02-20: 650 mg via RECTAL

## 2013-02-20 MED ORDER — MAGNESIUM SULFATE 40 MG/ML IJ SOLN
4.0000 g | Freq: Once | INTRAMUSCULAR | Status: AC
  Administered 2013-02-20: 4 g via INTRAVENOUS
  Filled 2013-02-20: qty 100

## 2013-02-20 MED ORDER — ROCURONIUM BROMIDE 100 MG/10ML IV SOLN
INTRAVENOUS | Status: DC | PRN
  Administered 2013-02-20: 30 mg via INTRAVENOUS
  Administered 2013-02-20: 20 mg via INTRAVENOUS
  Administered 2013-02-20 (×3): 50 mg via INTRAVENOUS

## 2013-02-20 MED ORDER — TRAMADOL HCL 50 MG PO TABS
50.0000 mg | ORAL_TABLET | Freq: Four times a day (QID) | ORAL | Status: DC | PRN
  Administered 2013-02-21 – 2013-02-28 (×11): 50 mg via ORAL
  Filled 2013-02-20 (×11): qty 1

## 2013-02-20 MED ORDER — METOPROLOL TARTRATE 25 MG/10 ML ORAL SUSPENSION
12.5000 mg | Freq: Two times a day (BID) | ORAL | Status: DC
  Administered 2013-02-20 – 2013-02-21 (×2): 12.5 mg
  Filled 2013-02-20 (×7): qty 5

## 2013-02-20 MED ORDER — FLUTICASONE PROPIONATE 50 MCG/ACT NA SUSP
1.0000 | Freq: Two times a day (BID) | NASAL | Status: DC | PRN
  Administered 2013-02-23: 1 via NASAL
  Filled 2013-02-20: qty 16

## 2013-02-20 MED ORDER — DOPAMINE-DEXTROSE 3.2-5 MG/ML-% IV SOLN
INTRAVENOUS | Status: DC | PRN
  Administered 2013-02-20: 5 ug/kg/min via INTRAVENOUS

## 2013-02-20 MED ORDER — SIMVASTATIN 20 MG PO TABS
20.0000 mg | ORAL_TABLET | Freq: Every day | ORAL | Status: DC
  Administered 2013-02-21 – 2013-02-25 (×5): 20 mg via ORAL
  Filled 2013-02-20 (×8): qty 1

## 2013-02-20 MED ORDER — BISACODYL 10 MG RE SUPP: 10.0000 mg | Freq: Every day | RECTAL | Status: DC

## 2013-02-20 MED ORDER — PANTOPRAZOLE SODIUM 40 MG PO TBEC
40.0000 mg | DELAYED_RELEASE_TABLET | Freq: Every day | ORAL | Status: DC
  Administered 2013-02-21 – 2013-03-09 (×16): 40 mg via ORAL
  Filled 2013-02-20 (×16): qty 1

## 2013-02-20 MED ORDER — 0.9 % SODIUM CHLORIDE (POUR BTL) OPTIME
TOPICAL | Status: DC | PRN
  Administered 2013-02-20: 1000 mL

## 2013-02-20 MED ORDER — HEMOSTATIC AGENTS (NO CHARGE) OPTIME
TOPICAL | Status: DC | PRN
  Administered 2013-02-20: 1 via TOPICAL

## 2013-02-20 MED ORDER — ALBUMIN HUMAN 5 % IV SOLN
250.0000 mL | INTRAVENOUS | Status: AC | PRN
  Administered 2013-02-20: 250 mL via INTRAVENOUS

## 2013-02-20 MED ORDER — AMINOCAPROIC ACID 250 MG/ML IV SOLN
INTRAVENOUS | Status: DC | PRN
  Administered 2013-02-20: 5 g via INTRAVENOUS

## 2013-02-20 MED ORDER — FERROUS SULFATE 325 (65 FE) MG PO TABS
325.0000 mg | ORAL_TABLET | Freq: Every day | ORAL | Status: DC
  Administered 2013-02-21: 325 mg via ORAL
  Filled 2013-02-20 (×2): qty 1

## 2013-02-20 MED ORDER — ACETAMINOPHEN 160 MG/5ML PO SOLN: 650.0000 mg | Freq: Once | ORAL | Status: AC

## 2013-02-20 MED ORDER — METOPROLOL TARTRATE 1 MG/ML IV SOLN
2.5000 mg | INTRAVENOUS | Status: DC | PRN
  Administered 2013-02-27: 2.5 mg via INTRAVENOUS
  Administered 2013-02-27: 5 mg via INTRAVENOUS
  Administered 2013-02-28 – 2013-03-01 (×2): 2.5 mg via INTRAVENOUS
  Filled 2013-02-20 (×4): qty 5

## 2013-02-20 MED ORDER — PROTAMINE SULFATE 10 MG/ML IV SOLN
INTRAVENOUS | Status: DC | PRN
  Administered 2013-02-20: 250 mg via INTRAVENOUS

## 2013-02-20 MED ORDER — PROPOFOL 10 MG/ML IV BOLUS
INTRAVENOUS | Status: DC | PRN
  Administered 2013-02-20: 100 mg via INTRAVENOUS

## 2013-02-20 MED ORDER — ALBUMIN HUMAN 5 % IV SOLN
INTRAVENOUS | Status: DC | PRN
  Administered 2013-02-20: 13:00:00 via INTRAVENOUS

## 2013-02-20 MED ORDER — ONDANSETRON HCL 4 MG/2ML IJ SOLN
4.0000 mg | Freq: Four times a day (QID) | INTRAMUSCULAR | Status: DC | PRN
  Administered 2013-02-27: 4 mg via INTRAVENOUS
  Filled 2013-02-20: qty 2

## 2013-02-20 MED ORDER — MIDAZOLAM HCL 5 MG/5ML IJ SOLN: INTRAMUSCULAR | Status: DC | PRN

## 2013-02-20 MED ORDER — LACTATED RINGERS IV SOLN: 500.0000 mL | Freq: Once | INTRAVENOUS | Status: DC | PRN

## 2013-02-20 MED ORDER — LACTATED RINGERS IV SOLN
INTRAVENOUS | Status: DC | PRN
  Administered 2013-02-20: 07:00:00 via INTRAVENOUS

## 2013-02-20 MED ORDER — POLYETHYLENE GLYCOL 3350 17 GM/SCOOP PO POWD
17.0000 g | Freq: Every day | ORAL | Status: DC
  Filled 2013-02-20 (×2): qty 255

## 2013-02-20 MED ORDER — FAMOTIDINE 20 MG PO TABS: 20.0000 mg | ORAL_TABLET | Freq: Every day | ORAL | Status: DC

## 2013-02-20 MED ORDER — PHENYLEPHRINE HCL 10 MG/ML IJ SOLN
0.0000 ug/min | INTRAVENOUS | Status: DC
  Administered 2013-02-21: 5 ug/min via INTRAVENOUS
  Filled 2013-02-20 (×2): qty 2

## 2013-02-20 MED ORDER — SODIUM CHLORIDE 0.9 % IV SOLN
INTRAVENOUS | Status: DC
  Filled 2013-02-20: qty 1

## 2013-02-20 MED ORDER — VANCOMYCIN HCL IN DEXTROSE 1-5 GM/200ML-% IV SOLN
1000.0000 mg | Freq: Two times a day (BID) | INTRAVENOUS | Status: AC
  Administered 2013-02-21 – 2013-02-22 (×3): 1000 mg via INTRAVENOUS
  Filled 2013-02-20 (×3): qty 200

## 2013-02-20 MED ORDER — ASPIRIN EC 325 MG PO TBEC
325.0000 mg | DELAYED_RELEASE_TABLET | Freq: Every day | ORAL | Status: DC
  Administered 2013-02-21: 325 mg via ORAL
  Filled 2013-02-20 (×2): qty 1

## 2013-02-20 MED ORDER — MIDAZOLAM HCL 5 MG/5ML IJ SOLN
INTRAMUSCULAR | Status: DC | PRN
  Administered 2013-02-20: 2 mg via INTRAVENOUS
  Administered 2013-02-20: 1 mg via INTRAVENOUS
  Administered 2013-02-20: 2 mg via INTRAVENOUS
  Administered 2013-02-20: 3 mg via INTRAVENOUS
  Administered 2013-02-20: 2 mg via INTRAVENOUS

## 2013-02-20 MED ORDER — BISACODYL 5 MG PO TBEC
10.0000 mg | DELAYED_RELEASE_TABLET | Freq: Every day | ORAL | Status: DC
  Administered 2013-02-21 – 2013-03-06 (×10): 10 mg via ORAL
  Filled 2013-02-20 (×12): qty 2

## 2013-02-20 MED ORDER — OXYCODONE HCL 5 MG PO TABS: 5.0000 mg | ORAL_TABLET | ORAL | Status: DC | PRN

## 2013-02-20 MED ORDER — CALCIUM CITRATE-VITAMIN D 315-200 MG-UNIT PO TABS: 1.0000 | ORAL_TABLET | Freq: Every day | ORAL | Status: DC

## 2013-02-20 MED ORDER — SODIUM CHLORIDE 0.9 % IJ SOLN
3.0000 mL | INTRAMUSCULAR | Status: DC | PRN
  Administered 2013-02-21: 3 mL via INTRAVENOUS

## 2013-02-20 MED ORDER — VITAMIN D 1000 UNITS PO TABS
1000.0000 [IU] | ORAL_TABLET | Freq: Every day | ORAL | Status: DC
  Administered 2013-02-21 – 2013-03-09 (×16): 1000 [IU] via ORAL
  Filled 2013-02-20 (×17): qty 1

## 2013-02-20 MED ORDER — METOPROLOL TARTRATE 12.5 MG HALF TABLET
12.5000 mg | ORAL_TABLET | Freq: Two times a day (BID) | ORAL | Status: DC
  Administered 2013-02-22 (×2): 12.5 mg via ORAL
  Filled 2013-02-20 (×7): qty 1

## 2013-02-20 MED ORDER — SODIUM CHLORIDE 0.9 % IJ SOLN
OROMUCOSAL | Status: DC | PRN
  Administered 2013-02-20: 07:00:00 via TOPICAL

## 2013-02-20 MED ORDER — LACTATED RINGERS IV SOLN
INTRAVENOUS | Status: DC | PRN
  Administered 2013-02-20 (×2): via INTRAVENOUS

## 2013-02-20 MED ORDER — LACTATED RINGERS IV SOLN
INTRAVENOUS | Status: DC
  Administered 2013-02-20: 15:00:00 via INTRAVENOUS

## 2013-02-20 MED ORDER — ACETAMINOPHEN 160 MG/5ML PO SOLN
1000.0000 mg | Freq: Four times a day (QID) | ORAL | Status: AC
  Filled 2013-02-20: qty 40

## 2013-02-20 MED ORDER — SODIUM BICARBONATE 8.4 % IV SOLN
50.0000 meq | Freq: Once | INTRAVENOUS | Status: AC
  Administered 2013-02-20: 50 meq via INTRAVENOUS

## 2013-02-20 MED ORDER — VANCOMYCIN HCL IN DEXTROSE 1-5 GM/200ML-% IV SOLN
1000.0000 mg | Freq: Once | INTRAVENOUS | Status: AC
  Administered 2013-02-20: 1000 mg via INTRAVENOUS
  Filled 2013-02-20: qty 200

## 2013-02-20 MED ORDER — DOPAMINE-DEXTROSE 3.2-5 MG/ML-% IV SOLN: 0.0000 ug/kg/min | INTRAVENOUS | Status: DC

## 2013-02-20 MED ORDER — ACETAMINOPHEN 500 MG PO TABS
1000.0000 mg | ORAL_TABLET | Freq: Four times a day (QID) | ORAL | Status: AC
  Administered 2013-02-21 – 2013-02-25 (×18): 1000 mg via ORAL
  Filled 2013-02-20 (×21): qty 2

## 2013-02-20 MED ORDER — MIDAZOLAM HCL 2 MG/2ML IJ SOLN: 2.0000 mg | INTRAMUSCULAR | Status: DC | PRN

## 2013-02-20 MED ORDER — FENTANYL CITRATE 0.05 MG/ML IJ SOLN
INTRAMUSCULAR | Status: DC | PRN
  Administered 2013-02-20: 250 ug via INTRAVENOUS
  Administered 2013-02-20: 50 ug via INTRAVENOUS
  Administered 2013-02-20 (×2): 150 ug via INTRAVENOUS
  Administered 2013-02-20: 100 ug via INTRAVENOUS
  Administered 2013-02-20: 50 ug via INTRAVENOUS
  Administered 2013-02-20: 150 ug via INTRAVENOUS
  Administered 2013-02-20 (×2): 100 ug via INTRAVENOUS

## 2013-02-20 MED ORDER — POTASSIUM CHLORIDE 10 MEQ/50ML IV SOLN
10.0000 meq | INTRAVENOUS | Status: AC
  Administered 2013-02-20 (×3): 10 meq via INTRAVENOUS

## 2013-02-20 SURGICAL SUPPLY — 122 items
ADAPTER CARDIO PERF ANTE/RETRO (ADAPTER) ×4 IMPLANT
ATTRACTOMAT 16X20 MAGNETIC DRP (DRAPES) ×4 IMPLANT
BAG DECANTER FOR FLEXI CONT (MISCELLANEOUS) ×4 IMPLANT
BANDAGE ELASTIC 4 VELCRO ST LF (GAUZE/BANDAGES/DRESSINGS) ×4 IMPLANT
BANDAGE ELASTIC 6 VELCRO ST LF (GAUZE/BANDAGES/DRESSINGS) ×4 IMPLANT
BANDAGE GAUZE ELAST BULKY 4 IN (GAUZE/BANDAGES/DRESSINGS) ×4 IMPLANT
BASKET HEART  (ORDER IN 25'S) (MISCELLANEOUS) ×1
BASKET HEART (ORDER IN 25'S) (MISCELLANEOUS) ×1
BASKET HEART (ORDER IN 25S) (MISCELLANEOUS) ×2 IMPLANT
BLADE STERNUM SYSTEM 6 (BLADE) ×4 IMPLANT
BLADE SURG 11 STRL SS (BLADE) ×4 IMPLANT
BLADE SURG 12 STRL SS (BLADE) ×4 IMPLANT
BLADE SURG 15 STRL LF DISP TIS (BLADE) ×2 IMPLANT
BLADE SURG 15 STRL SS (BLADE) ×2
BLADE SURG ROTATE 9660 (MISCELLANEOUS) ×4 IMPLANT
BNDG GAUZE ELAST 4 BULKY (GAUZE/BANDAGES/DRESSINGS) ×4 IMPLANT
CANISTER SUCTION 2500CC (MISCELLANEOUS) ×4 IMPLANT
CANNULA GUNDRY RCSP 15FR (MISCELLANEOUS) ×4 IMPLANT
CANNULA VENOUS LOW PROF 32X40 (CANNULA) IMPLANT
CANNULA VESSEL 3MM BLUNT TIP (CANNULA) ×12 IMPLANT
CARDIAC SUCTION (MISCELLANEOUS) ×8 IMPLANT
CATH CPB KIT VANTRIGT (MISCELLANEOUS) ×4 IMPLANT
CATH HEART VENT LEFT (CATHETERS) ×2 IMPLANT
CATH RETROPLEGIA CORONARY 14FR (CATHETERS) IMPLANT
CATH ROBINSON RED A/P 18FR (CATHETERS) ×12 IMPLANT
CATH THORACIC 36FR RT ANG (CATHETERS) ×4 IMPLANT
CLIP FOGARTY SPRING 6M (CLIP) IMPLANT
CLIP TI WIDE RED SMALL 24 (CLIP) IMPLANT
CONT SPEC 4OZ CLIKSEAL STRL BL (MISCELLANEOUS) ×4 IMPLANT
COVER SURGICAL LIGHT HANDLE (MISCELLANEOUS) ×8 IMPLANT
CRADLE DONUT ADULT HEAD (MISCELLANEOUS) ×4 IMPLANT
DERMABOND ADVANCED (GAUZE/BANDAGES/DRESSINGS) ×2
DERMABOND ADVANCED .7 DNX12 (GAUZE/BANDAGES/DRESSINGS) ×2 IMPLANT
DRAIN CHANNEL 32F RND 10.7 FF (WOUND CARE) ×4 IMPLANT
DRAPE CARDIOVASCULAR INCISE (DRAPES) ×2
DRAPE SLUSH/WARMER DISC (DRAPES) ×4 IMPLANT
DRAPE SRG 135X102X78XABS (DRAPES) ×2 IMPLANT
DRSG AQUACEL AG ADV 3.5X14 (GAUZE/BANDAGES/DRESSINGS) ×4 IMPLANT
ELECT BLADE 4.0 EZ CLEAN MEGAD (MISCELLANEOUS) ×8
ELECT BLADE 6.5 EXT (BLADE) ×4 IMPLANT
ELECT CAUTERY BLADE 6.4 (BLADE) ×4 IMPLANT
ELECT REM PT RETURN 9FT ADLT (ELECTROSURGICAL) ×8
ELECTRODE BLDE 4.0 EZ CLN MEGD (MISCELLANEOUS) ×4 IMPLANT
ELECTRODE REM PT RTRN 9FT ADLT (ELECTROSURGICAL) ×4 IMPLANT
GLOVE BIO SURGEON STRL SZ 6 (GLOVE) ×20 IMPLANT
GLOVE BIO SURGEON STRL SZ 6.5 (GLOVE) ×6 IMPLANT
GLOVE BIO SURGEON STRL SZ7.5 (GLOVE) ×12 IMPLANT
GLOVE BIO SURGEONS STRL SZ 6.5 (GLOVE) ×2
GLOVE BIOGEL PI IND STRL 6 (GLOVE) ×4 IMPLANT
GLOVE BIOGEL PI IND STRL 6.5 (GLOVE) ×6 IMPLANT
GLOVE BIOGEL PI IND STRL 7.0 (GLOVE) ×2 IMPLANT
GLOVE BIOGEL PI INDICATOR 6 (GLOVE) ×4
GLOVE BIOGEL PI INDICATOR 6.5 (GLOVE) ×6
GLOVE BIOGEL PI INDICATOR 7.0 (GLOVE) ×2
GOWN STRL NON-REIN LRG LVL3 (GOWN DISPOSABLE) ×40 IMPLANT
HEMOSTAT POWDER SURGIFOAM 1G (HEMOSTASIS) ×12 IMPLANT
HEMOSTAT SURGICEL 2X14 (HEMOSTASIS) ×4 IMPLANT
INSERT FOGARTY XLG (MISCELLANEOUS) IMPLANT
KIT BASIN OR (CUSTOM PROCEDURE TRAY) ×4 IMPLANT
KIT ROOM TURNOVER OR (KITS) ×4 IMPLANT
KIT SUCTION CATH 14FR (SUCTIONS) ×4 IMPLANT
KIT VASOVIEW W/TROCAR VH 2000 (KITS) ×4 IMPLANT
LEAD PACING MYOCARDI (MISCELLANEOUS) ×4 IMPLANT
LINE VENT (MISCELLANEOUS) ×4 IMPLANT
MARKER GRAFT CORONARY BYPASS (MISCELLANEOUS) ×12 IMPLANT
NS IRRIG 1000ML POUR BTL (IV SOLUTION) ×32 IMPLANT
PACK OPEN HEART (CUSTOM PROCEDURE TRAY) ×4 IMPLANT
PAD ARMBOARD 7.5X6 YLW CONV (MISCELLANEOUS) ×8 IMPLANT
PAD ELECT DEFIB RADIOL ZOLL (MISCELLANEOUS) ×4 IMPLANT
PENCIL BUTTON HOLSTER BLD 10FT (ELECTRODE) ×4 IMPLANT
PUNCH AORTIC ROTATE 4.0MM (MISCELLANEOUS) IMPLANT
PUNCH AORTIC ROTATE 4.5MM 8IN (MISCELLANEOUS) ×4 IMPLANT
PUNCH AORTIC ROTATE 5MM 8IN (MISCELLANEOUS) IMPLANT
SET CARDIOPLEGIA MPS 5001102 (MISCELLANEOUS) ×4 IMPLANT
SOLUTION ANTI FOG 6CC (MISCELLANEOUS) ×4 IMPLANT
SPONGE GAUZE 4X4 12PLY (GAUZE/BANDAGES/DRESSINGS) ×8 IMPLANT
SPONGE GAUZE 4X4 12PLY STER LF (GAUZE/BANDAGES/DRESSINGS) ×8 IMPLANT
SPONGE LAP 18X18 X RAY DECT (DISPOSABLE) ×8 IMPLANT
SURGIFLO W/THROMBIN 8M KIT (HEMOSTASIS) ×8 IMPLANT
SUT BONE WAX W31G (SUTURE) ×4 IMPLANT
SUT ETHIBON 2 0 V 52N 30 (SUTURE) ×12 IMPLANT
SUT ETHIBOND 2 0 SH (SUTURE) ×2
SUT ETHIBOND 2 0 SH 36X2 (SUTURE) ×2 IMPLANT
SUT MNCRL AB 4-0 PS2 18 (SUTURE) ×4 IMPLANT
SUT PROLENE 3 0 RB 1 (SUTURE) ×4 IMPLANT
SUT PROLENE 3 0 SH 1 (SUTURE) IMPLANT
SUT PROLENE 3 0 SH DA (SUTURE) IMPLANT
SUT PROLENE 3 0 SH1 36 (SUTURE) IMPLANT
SUT PROLENE 4 0 RB 1 (SUTURE) ×8
SUT PROLENE 4 0 SH DA (SUTURE) ×16 IMPLANT
SUT PROLENE 4-0 RB1 .5 CRCL 36 (SUTURE) ×8 IMPLANT
SUT PROLENE 5 0 C 1 36 (SUTURE) IMPLANT
SUT PROLENE 6 0 C 1 30 (SUTURE) IMPLANT
SUT PROLENE 6 0 CC (SUTURE) ×20 IMPLANT
SUT PROLENE 7 0 BV1 MDA (SUTURE) IMPLANT
SUT PROLENE 8 0 BV175 6 (SUTURE) ×12 IMPLANT
SUT PROLENE BLUE 7 0 (SUTURE) ×8 IMPLANT
SUT SILK  1 MH (SUTURE)
SUT SILK 1 MH (SUTURE) IMPLANT
SUT SILK 2 0 SH CR/8 (SUTURE) ×4 IMPLANT
SUT SILK 3 0 SH CR/8 (SUTURE) IMPLANT
SUT STEEL 6MS V (SUTURE) ×8 IMPLANT
SUT STEEL SZ 6 DBL 3X14 BALL (SUTURE) ×4 IMPLANT
SUT VIC AB 1 CTX 36 (SUTURE) ×6
SUT VIC AB 1 CTX36XBRD ANBCTR (SUTURE) ×6 IMPLANT
SUT VIC AB 2-0 CT1 27 (SUTURE) ×2
SUT VIC AB 2-0 CT1 TAPERPNT 27 (SUTURE) ×2 IMPLANT
SUT VIC AB 2-0 CTX 27 (SUTURE) IMPLANT
SUT VIC AB 3-0 X1 27 (SUTURE) IMPLANT
SUTURE E-PAK OPEN HEART (SUTURE) ×4 IMPLANT
SYR 10ML KIT SKIN ADHESIVE (MISCELLANEOUS) IMPLANT
SYSTEM SAHARA CHEST DRAIN ATS (WOUND CARE) ×4 IMPLANT
TAPE CLOTH SURG 4X10 WHT LF (GAUZE/BANDAGES/DRESSINGS) ×4 IMPLANT
TAPE PAPER 2X10 WHT MICROPORE (GAUZE/BANDAGES/DRESSINGS) ×4 IMPLANT
TOWEL OR 17X24 6PK STRL BLUE (TOWEL DISPOSABLE) ×8 IMPLANT
TOWEL OR 17X26 10 PK STRL BLUE (TOWEL DISPOSABLE) ×8 IMPLANT
TRAY FOLEY IC TEMP SENS 14FR (CATHETERS) ×4 IMPLANT
TUBING INSUFFLATION 10FT LAP (TUBING) IMPLANT
UNDERPAD 30X30 INCONTINENT (UNDERPADS AND DIAPERS) ×4 IMPLANT
VALVE MAGNA EASE AORTIC 23MM (Prosthesis & Implant Heart) ×4 IMPLANT
VENT LEFT HEART 12002 (CATHETERS) ×4
WATER STERILE IRR 1000ML POUR (IV SOLUTION) ×8 IMPLANT

## 2013-02-20 NOTE — Transfer of Care (Signed)
Immediate Anesthesia Transfer of Care Note  Patient: Robert Richard  Procedure(s) Performed: Procedure(s): CORONARY ARTERY BYPASS GRAFTING (CABG) (N/A) AORTIC VALVE REPLACEMENT (AVR) (N/A) INTRAOPERATIVE TRANSESOPHAGEAL ECHOCARDIOGRAM (N/A)  Patient Location: SICU  Anesthesia Type:General  Level of Consciousness: sedated, unresponsive and Patient remains intubated per anesthesia plan  Airway & Oxygen Therapy: Patient remains intubated per anesthesia plan and Patient placed on Ventilator (see vital sign flow sheet for setting)  Post-op Assessment: Report given to PACU RN and Post -op Vital signs reviewed and stable  Post vital signs: Reviewed and stable  Complications: No apparent anesthesia complications

## 2013-02-20 NOTE — Anesthesia Postprocedure Evaluation (Signed)
  Anesthesia Post-op Note  Patient: Waynette Buttery  Procedure(s) Performed: Procedure(s): CORONARY ARTERY BYPASS GRAFTING (CABG) (N/A) AORTIC VALVE REPLACEMENT (AVR) (N/A) INTRAOPERATIVE TRANSESOPHAGEAL ECHOCARDIOGRAM (N/A)  Patient Location: ICU  Anesthesia Type:General  Level of Consciousness: sedated  Airway and Oxygen Therapy: Patient remains intubated per anesthesia plan  Post-op Pain: none  Post-op Assessment: Post-op Vital signs reviewed, Patient's Cardiovascular Status Stable and Respiratory Function Stable  Post-op Vital Signs: Reviewed and stable  Complications: No apparent anesthesia complications

## 2013-02-20 NOTE — Preoperative (Signed)
Beta Blockers   Reason not to administer Beta Blockers:Not Applicable 

## 2013-02-20 NOTE — Anesthesia Preprocedure Evaluation (Addendum)
Anesthesia Evaluation  Patient identified by MRN, date of birth, ID band Patient awake    Reviewed: Allergy & Precautions, H&P , NPO status , Patient's Chart, lab work & pertinent test results  History of Anesthesia Complications Negative for: history of anesthetic complications  Airway Mallampati: II TM Distance: >3 FB Neck ROM: full    Dental  (+) Dental Advisory Given, Caps and Teeth Intact   Pulmonary shortness of breath, asthma , COPDformer smoker,          Cardiovascular hypertension, + Peripheral Vascular Disease     Neuro/Psych  Headaches,    GI/Hepatic   Endo/Other    Renal/GU      Musculoskeletal  (+) Arthritis -,   Abdominal   Peds  Hematology   Anesthesia Other Findings   Reproductive/Obstetrics                         Anesthesia Physical Anesthesia Plan  ASA: III  Anesthesia Plan: General   Post-op Pain Management:    Induction: Intravenous  Airway Management Planned: Oral ETT  Additional Equipment: Arterial line, PA Cath and TEE  Intra-op Plan:   Post-operative Plan: Post-operative intubation/ventilation  Informed Consent: I have reviewed the patients History and Physical, chart, labs and discussed the procedure including the risks, benefits and alternatives for the proposed anesthesia with the patient or authorized representative who has indicated his/her understanding and acceptance.     Plan Discussed with: CRNA, Anesthesiologist and Surgeon  Anesthesia Plan Comments:         Anesthesia Quick Evaluation

## 2013-02-20 NOTE — Progress Notes (Signed)
  Echocardiogram Echocardiogram Transesophageal (OR) has been performed.  [place], [place] 02/20/2013, 8:21 AM

## 2013-02-20 NOTE — Progress Notes (Signed)
Utilization Review Completed.Robert Richard T1/03/2013  

## 2013-02-20 NOTE — Progress Notes (Signed)
The patient was examined and preop studies reviewed. There has been no change from the prior exam and the patient is ready for surgery.  Plan AVR-CABG on J Jou today

## 2013-02-20 NOTE — Brief Op Note (Signed)
02/20/2013  12:15 PM  PATIENT:  Robert Richard  80 y.o. male  PRE-OPERATIVE DIAGNOSIS:  CAD AS  POST-OPERATIVE DIAGNOSIS:  CAD AS  PROCEDURE:  Procedure(s):  CORONARY ARTERY BYPASS GRAFTING x 3 -LIMA to LAD -SVG to RCA -SVG to OM  AORTIC VALVE REPLACEMENT -23 mm [employer] Ease Pericardial Tissue Valve  ENDOSCOPIC SAPHENOUS VEIN HARVEST RIGHT THIGH  INTRAOPERATIVE TRANSESOPHAGEAL ECHOCARDIOGRAM (N/A)  SURGEON:  Surgeon(s) and Role:    * Ivin Poot, MD - Primary  PHYSICIAN ASSISTANT: Ellwood Handler PA-C  ANESTHESIA:   general  EBL:  Total I/O In: -  Out: 525 [Urine:525]  BLOOD ADMINISTERED: CELLSAVER  DRAINS: Left Pleural Chest Tube, Mediastinal Chest tubes   LOCAL MEDICATIONS USED:  NONE  SPECIMEN:  Source of Specimen:  Aortic Valve  DISPOSITION OF SPECIMEN:  PATHOLOGY  COUNTS:  YES  TOURNIQUET:  * No tourniquets in log *  DICTATION: .Dragon Dictation  PLAN OF CARE: Admit to inpatient   PATIENT DISPOSITION:  ICU - intubated and hemodynamically stable.   Delay start of Pharmacological VTE agent (>24hrs) due to surgical blood loss or risk of bleeding: yes

## 2013-02-20 NOTE — Procedures (Signed)
Extubation Procedure Note  Patient Details:   Name: Robert Richard DOB: 1930-03-06 MRN: [id number]   Airway Documentation Pre extubation: Pt has completed Rapid Wean Protocol without difficulty. Pt follows all commands. ABG within normal range. Audible cuff leak. NIF -30, VC 950. Post extubation: Pt placed on 6L [place] for Sats 92%. Pt seems disoriented to location and procedure. Able to state name. No stridor noted. RN at bedside. RT will continue to monitor.   Evaluation  O2 sats: stable throughout Complications: No apparent complications Patient did tolerate procedure well. Bilateral Breath Sounds: Clear   Yes  Sharen Hint 02/20/2013, 8:30 PM

## 2013-02-20 NOTE — Progress Notes (Signed)
CT surgery p.m. Rounds  Status post aVR CABG today Patient still sedated from surgery Hemodynamic stable, a pacing at 90 Good urine output, minimal chest tube drainage Labs satisfactory

## 2013-02-21 ENCOUNTER — Inpatient Hospital Stay (HOSPITAL_COMMUNITY): Payer: Medicare Other

## 2013-02-21 LAB — POCT I-STAT, CHEM 8
BUN: 25 mg/dL — ABNORMAL HIGH (ref 6–23)
Calcium, Ion: 1.19 mmol/L (ref 1.13–1.30)
Chloride: 103 mEq/L (ref 96–112)
Creatinine, Ser: 1.3 mg/dL (ref 0.50–1.35)
Glucose, Bld: 176 mg/dL — ABNORMAL HIGH (ref 70–99)
HCT: 24 % — ABNORMAL LOW (ref 39.0–52.0)
Hemoglobin: 8.2 g/dL — ABNORMAL LOW (ref 13.0–17.0)
Potassium: 4.1 mEq/L (ref 3.7–5.3)
Sodium: 138 mEq/L (ref 137–147)
TCO2: 18 mmol/L (ref 0–100)

## 2013-02-21 LAB — BASIC METABOLIC PANEL
BUN: 23 mg/dL (ref 6–23)
BUN: 27 mg/dL — ABNORMAL HIGH (ref 6–23)
CO2: 21 mEq/L (ref 19–32)
CO2: 19 mEq/L (ref 19–32)
Calcium: 7.6 mg/dL — ABNORMAL LOW (ref 8.4–10.5)
Calcium: 8.1 mg/dL — ABNORMAL LOW (ref 8.4–10.5)
Chloride: 108 mEq/L (ref 96–112)
Chloride: 102 mEq/L (ref 96–112)
Creatinine, Ser: 1.17 mg/dL (ref 0.50–1.35)
Creatinine, Ser: 1.24 mg/dL (ref 0.50–1.35)
GFR calc Af Amer: 66 mL/min — ABNORMAL LOW (ref >90)
GFR calc Af Amer: 62 mL/min — ABNORMAL LOW (ref >90)
GFR calc non Af Amer: 57 mL/min — ABNORMAL LOW (ref >90)
GFR calc non Af Amer: 53 mL/min — ABNORMAL LOW (ref >90)
Glucose, Bld: 79 mg/dL (ref 70–99)
Glucose, Bld: 175 mg/dL — ABNORMAL HIGH (ref 70–99)
Potassium: 4.1 mEq/L (ref 3.7–5.3)
Potassium: 4.2 mEq/L (ref 3.7–5.3)
Sodium: 141 mEq/L (ref 137–147)
Sodium: 135 mEq/L — ABNORMAL LOW (ref 137–147)

## 2013-02-21 LAB — GLUCOSE, CAPILLARY
Glucose-Capillary: 100 mg/dL — ABNORMAL HIGH (ref 70–99)
Glucose-Capillary: 100 mg/dL — ABNORMAL HIGH (ref 70–99)
Glucose-Capillary: 101 mg/dL — ABNORMAL HIGH (ref 70–99)
Glucose-Capillary: 102 mg/dL — ABNORMAL HIGH (ref 70–99)
Glucose-Capillary: 120 mg/dL — ABNORMAL HIGH (ref 70–99)
Glucose-Capillary: 122 mg/dL — ABNORMAL HIGH (ref 70–99)
Glucose-Capillary: 130 mg/dL — ABNORMAL HIGH (ref 70–99)
Glucose-Capillary: 133 mg/dL — ABNORMAL HIGH (ref 70–99)
Glucose-Capillary: 151 mg/dL — ABNORMAL HIGH (ref 70–99)
Glucose-Capillary: 99 mg/dL (ref 70–99)
Glucose-Capillary: 78 mg/dL (ref 70–99)
Glucose-Capillary: 123 mg/dL — ABNORMAL HIGH (ref 70–99)
Glucose-Capillary: 135 mg/dL — ABNORMAL HIGH (ref 70–99)
Glucose-Capillary: 160 mg/dL — ABNORMAL HIGH (ref 70–99)
Glucose-Capillary: 196 mg/dL — ABNORMAL HIGH (ref 70–99)

## 2013-02-21 LAB — POCT I-STAT 3, ART BLOOD GAS (G3+)
Acid-base deficit: 4 mmol/L — ABNORMAL HIGH (ref 0.0–2.0)
Acid-base deficit: 4 mmol/L — ABNORMAL HIGH (ref 0.0–2.0)
Bicarbonate: 20.4 mEq/L (ref 20.0–24.0)
Bicarbonate: 20.7 mEq/L (ref 20.0–24.0)
O2 Saturation: 99 %
O2 Saturation: 99 %
Patient temperature: 37.5
Patient temperature: 37.1
TCO2: 21 mmol/L (ref 0–100)
TCO2: 22 mmol/L (ref 0–100)
pCO2 arterial: 34.5 mmHg — ABNORMAL LOW (ref 35.0–45.0)
pCO2 arterial: 34.4 mmHg — ABNORMAL LOW (ref 35.0–45.0)
pH, Arterial: 7.381 (ref 7.350–7.450)
pH, Arterial: 7.388 (ref 7.350–7.450)
pO2, Arterial: 139 mmHg — ABNORMAL HIGH (ref 80.0–100.0)
pO2, Arterial: 127 mmHg — ABNORMAL HIGH (ref 80.0–100.0)

## 2013-02-21 LAB — CBC
HCT: 23.9 % — ABNORMAL LOW (ref 39.0–52.0)
HCT: 23.7 % — ABNORMAL LOW (ref 39.0–52.0)
Hemoglobin: 8.4 g/dL — ABNORMAL LOW (ref 13.0–17.0)
Hemoglobin: 7.9 g/dL — ABNORMAL LOW (ref 13.0–17.0)
MCH: 30 pg (ref 26.0–34.0)
MCH: 29.3 pg (ref 26.0–34.0)
MCHC: 35.1 g/dL (ref 30.0–36.0)
MCHC: 33.3 g/dL (ref 30.0–36.0)
MCV: 85.4 fL (ref 78.0–100.0)
MCV: 87.8 fL (ref 78.0–100.0)
Platelets: 102 10*3/uL — ABNORMAL LOW (ref 150–400)
Platelets: 89 10*3/uL — ABNORMAL LOW (ref 150–400)
RBC: 2.8 MIL/uL — ABNORMAL LOW (ref 4.22–5.81)
RBC: 2.7 MIL/uL — ABNORMAL LOW (ref 4.22–5.81)
RDW: 13.2 % (ref 11.5–15.5)
RDW: 13.5 % (ref 11.5–15.5)
WBC: 13.7 10*3/uL — ABNORMAL HIGH (ref 4.0–10.5)
WBC: 15 10*3/uL — ABNORMAL HIGH (ref 4.0–10.5)

## 2013-02-21 LAB — MAGNESIUM
Magnesium: 2.4 mg/dL (ref 1.5–2.5)
Magnesium: 2.5 mg/dL (ref 1.5–2.5)

## 2013-02-21 MED ORDER — MORPHINE SULFATE 2 MG/ML IJ SOLN
INTRAMUSCULAR | Status: AC
  Filled 2013-02-21: qty 1

## 2013-02-21 MED ORDER — INSULIN ASPART 100 UNIT/ML ~~LOC~~ SOLN
0.0000 [IU] | SUBCUTANEOUS | Status: DC
  Administered 2013-02-21: 2 [IU] via SUBCUTANEOUS
  Administered 2013-02-21: 4 [IU] via SUBCUTANEOUS
  Administered 2013-02-21 – 2013-02-22 (×7): 2 [IU] via SUBCUTANEOUS

## 2013-02-21 MED ORDER — MIDAZOLAM HCL 2 MG/2ML IJ SOLN: 1.0000 mg | INTRAMUSCULAR | Status: DC | PRN

## 2013-02-21 MED ORDER — FE FUMARATE-B12-VIT C-FA-IFC PO CAPS
1.0000 | ORAL_CAPSULE | Freq: Three times a day (TID) | ORAL | Status: DC
  Administered 2013-02-21 – 2013-03-09 (×43): 1 via ORAL
  Filled 2013-02-21 (×52): qty 1

## 2013-02-21 MED ORDER — INSULIN DETEMIR 100 UNIT/ML ~~LOC~~ SOLN
8.0000 [IU] | Freq: Every day | SUBCUTANEOUS | Status: DC
  Administered 2013-02-21 – 2013-02-22 (×2): 8 [IU] via SUBCUTANEOUS
  Filled 2013-02-21 (×3): qty 0.08

## 2013-02-21 MED ORDER — MORPHINE SULFATE 2 MG/ML IJ SOLN
2.0000 mg | INTRAMUSCULAR | Status: DC | PRN
  Administered 2013-02-21: 1 mg via INTRAVENOUS
  Filled 2013-02-21: qty 1

## 2013-02-21 NOTE — Progress Notes (Signed)
Attempted to bladder scan patient; bladder scan volume showed 7mL.  When reattempted, bladder scanner battery dead.  Will re-scan when charged/available.  Towel that had been placed around penis found soaked with what appeared to be bloody urine.  Will continue to monitor.  MD aware.

## 2013-02-21 NOTE — Progress Notes (Signed)
1 Day Post-Op Procedure(s) (LRB): CORONARY ARTERY BYPASS GRAFTING (CABG) (N/A) AORTIC VALVE REPLACEMENT (AVR) (N/A) INTRAOPERATIVE TRANSESOPHAGEAL ECHOCARDIOGRAM (N/A) Subjective: Extubated with stable hemodynamics Pulled out foley catheter last pm Now with normal mental status  Objective: Vital signs in last 24 hours: Temp:  [96.6 F (35.9 C)-99.3 F (37.4 C)] 99.3 F (37.4 C) (01/03 0800) Pulse Rate:  [68-118] 82 (01/03 0800) Cardiac Rhythm:  [-] Normal sinus rhythm (01/03 0800) Resp:  [0-24] 23 (01/03 0800) BP: (91-136)/(47-88) 104/50 mmHg (01/03 0800) SpO2:  [92 %-100 %] 96 % (01/03 0800) Arterial Line BP: (104-152)/(37-65) 140/45 mmHg (01/03 0800) FiO2 (%):  [40 %-50 %] 40 % (01/02 1953) Weight:  [152 lb 8.9 oz (69.2 kg)-160 lb 7.9 oz (72.8 kg)] 160 lb 7.9 oz (72.8 kg) (01/03 0600)  Hemodynamic parameters for last 24 hours: PAP: (17-37)/(6-25) 32/16 mmHg CO:  [3.4 L/min-5.6 L/min] 5.6 L/min CI:  [1.8 L/min/m2-2.9 L/min/m2] 2.9 L/min/m2  Intake/Output from previous day: 01/02 0701 - 01/03 0700 In: 4725.6 [I.V.:3394.6; Blood:431; IV Piggyback:900] Out: 2706 [Urine:2375; Blood:850; Chest Tube:400] Intake/Output this shift: Total I/O In: 246.4 [I.V.:46.4; IV Piggyback:200] Out: 150 [Chest Tube:150]  EXAM Lungs clear- no airleak extrem warm  Lab Results:  Recent Labs  02/20/13 2000 02/21/13 0430  WBC 17.4* 13.7*  HGB 9.7* 8.4*  HCT 29.1* 23.9*  PLT 132* 102*   BMET:  Recent Labs  02/20/13 1419 02/20/13 2000 02/21/13 0430  NA 143  --  141  K 3.6*  --  4.1  CL  --   --  108  CO2  --   --  21  GLUCOSE 120*  --  79  BUN  --   --  23  CREATININE  --  1.07 1.17  CALCIUM  --   --  7.6*    PT/INR:  Recent Labs  02/20/13 1441  LABPROT 17.8*  INR 1.51*   ABG    Component Value Date/Time   PHART 7.388 02/21/2013 0423   HCO3 20.7 02/21/2013 0423   TCO2 22 02/21/2013 0423   ACIDBASEDEF 4.0* 02/21/2013 0423   O2SAT 99.0 02/21/2013 0423   CBG (last 3)    Recent Labs  02/21/13 0218 02/21/13 0414 02/21/13 0749  GLUCAP 100* 78 123*    Assessment/Plan: S/P Procedure(s) (LRB): CORONARY ARTERY BYPASS GRAFTING (CABG) (N/A) AORTIC VALVE REPLACEMENT (AVR) (N/A) INTRAOPERATIVE TRANSESOPHAGEAL ECHOCARDIOGRAM (N/A)   NSR- stop pacing, wean off dopamine Scan bladder volume every 8 hours Progression out of bed  to chair   LOS: 1 day    Robert Richard,Robert Richard 02/21/2013

## 2013-02-21 NOTE — Progress Notes (Signed)
Bladder scan shows 82mL.  Bloody drainage noted to be coming from penis. MD aware.  Will continue to monitor.

## 2013-02-21 NOTE — Progress Notes (Signed)
Pt found with blood in bed and foley catheter wrapped around fingers. Balloon intact and covered with blood. Pt oriented to person, place, and time.  Pt states," I woke up and did not know where I was!"  MD notified. Attempted to replace foley per order. Attempt unsuccessful.

## 2013-02-21 NOTE — Op Note (Signed)
NAMEBEVERLY, BOSIO NO.:  [id number]  MEDICAL RECORD NO.:  [id number]  LOCATION:  2S07C                        FACILITY:  [facility]  PHYSICIAN:  Ivin Poot, M.D.  DATE OF BIRTH:  08-27-1930  DATE OF PROCEDURE:  02/20/2013 DATE OF DISCHARGE:                              OPERATIVE REPORT   OPERATION: 1. Coronary artery bypass grafting x3 (left internal mammary artery to     LAD, saphenous vein graft to OM1, saphenous vein graft to posterior     descending). 2. Aortic valve replacement with a 23 mm [facility] Ease     pericardial valve, serial number [id number]. 3. Endoscopic harvest of right leg greater saphenous vein.  PREOPERATIVE DIAGNOSIS:  Severe three-vessel coronary artery disease with moderate aortic stenosis of a heavily calcified aortic valve.  POSTOPERATIVE DIAGNOSIS:  Severe three-vessel coronary artery disease with moderate aortic stenosis of a heavily calcified aortic valve.  SURGEON:  Ivin Poot, MD  ASSISTANT:  Providence Crosby PA-C  ANESTHESIA:  General by Albertha Ghee, MD  INDICATIONS:  The patient is an 80 year old gentleman with progressive dyspnea on exertion, history of smoking-related emphysema, and recently diagnosed severe three-vessel coronary artery disease, with heavily calcified, restricted aortic valve disease, felt to have moderate aortic stenosis by transesophageal echocardiogram.  The patient was felt to be candidate for multivessel bypass grafting by his cardiologist with possible combined aortic valve replacement.  Prior to surgery, I examined the patient in the office on several occasions and reviewed results of his cardiac cath, 2D echocardiogram, transesophageal echo, and PFTs.  The patient understood the rationale for multivessel bypass grafting with combined aortic valve replacement. The procedure was discussed carefully with the patient as well as his family including the use of general anesthesia and  cardiopulmonary bypass, the location of the surgical incisions, the plan to replace the valve with a tissue valve due to his advanced age, and the expected postoperative hospital recovery.  I also reviewed carefully with the patient and family, the risks to him of combined CABG with AVR including risks of bleeding, blood transfusion requirement, stroke, MI, pacemaker dependency, multisystem failure, infection, and death.  After reviewing these issues for more than one occasion with patient and family in the office, they demonstrated their understanding and agreed to proceed with surgery under what I felt was an informed consent.  OPERATIVE FINDINGS: 1. Severely calcified coronaries with difficult targets. 2. Small, but acceptable saphenous vein harvested from the right leg. 3. Heavily calcified and thickened aortic valve, successfully replaced     with a 23 mm pericardial tissue valve. 4. Severe calcification of the mitral anulus with inability to fully     retract the heart to expose the posterior circumflex circulationwithout risk of calcium fracturing in the mitral anulus and resulting in atrial- ventricular disruption   OPERATIVE PROCEDURE IN DETAIL:  The patient was brought to the operating room and placed supine on the operating room table.  General anesthesia was induced under invasive hemodynamic monitoring.  The transesophageal echo probe was placed by the anesthesiologist which confirmed the preoperative diagnosis.  The patient was prepped and draped as a sterile field.  A proper time-out  was performed.  A sternal incision was made using saphenous vein, was harvested endoscopically from the right leg. The vein was small, but adequate.  The vein had been previously exposed for a peripheral vascular procedure on the left leg and was felt to be too small to use.  The left internal mammary artery was harvested.  There was a pedicle graft from its origin at the subclavian vessels.   It was a good vessel with excellent flow.  The sternal retractor was placed and the pericardium was opened and suspended.  The aorta was inspected and palpated.  He had no severe calcification.  The proximal coronaries were all severely calcified including the proximal RCA, the LAD, and circumflex.  Heparin was administered and pursestrings were placed in the ascending aorta and right atrium.  The patient was cannulated and placed on cardiopulmonary bypass.  A vent was placed via the right superior pulmonary vein.  The coronaries were identified for grafting.  The targets were difficult.  Cardioplegia cannulas was replaced for both antegrade and retrograde cold blood cardioplegia.  The mammary artery and vein grafts were prepared for the distal anastomoses.  The patient was cooled to 32 degrees.  The aortic crossclamp was applied.  A 1 L of cold blood cardioplegia was delivered in split doses between the antegrade aortic and retrograde coronary sinus catheters.  There was good cardioplegic arrest with septal temperature dropped less than 14 degrees.  Cardioplegia was delivered every 20 minutes or less while crossclamp was in place.  The distal coronary anastomoses were performed.  The first distal anastomosis was the posterior descending.  This had heavy calcification. The anastomosis was performed proximally where the vessel was somewhat softer and a better size.  A reverse saphenous vein was sewn end-to-side with running 7-0 Prolene with good flow through the graft.  Cardioplegia was redosed. The second distal anastomosis was the OM1 branch of the left circumflex. The OM1 had a proximal 80% stenosis.  A reverse saphenous vein was sewn end-to-side with running 7-0 Prolene and there was good flow through the graft.  Cardioplegia was redosed.  The third distal anastomosis was the mid LAD.  The LAD was calcified throughout its whole length.  A soft spot in the mid vessel was used  for the anastomosis.  The left IMA pedicle was brought through an opening and the left lateral pericardium was brought down onto the LAD and sewn end-to-side with running 8-0 Prolene.  There was good flow through the anastomosis after briefly releasing the pedicle bulldog on the mammary artery.  The bulldog was reapplied and the pedicle was secured to the epicardium with 6-0 Prolene.  Cardioplegia was redosed.  Attention was then directed to the aortic valve.  A transverse aortotomy was performed.  Aortic valve was inspected.  It was heavily calcified, thickened, and restricted.  It was excised.  The anulus was debrided of calcium.  The outflow tract was irrigated with copious amounts of cold saline.  The anulus was sized to a 23 mm Magna Ease valve.  Sub annular 2-0 pledgeted sutures were placed around the anulus, numbering 15 total. Valve was prepared according to protocol.  The sutures were then placed through the sewing ring of the valve.  The valve was seated and the sutures were tied.  The valve conformed to the anulus nicely.  There was no evidence of spaces between the valve for peripheral valvularly.  Both coronary ostia were widely patent.  The aortotomy was closed in layers  using running 4-0 Prolene in 2 layers.  Cardioplegia was redosed.  Two proximal vein anastomoses were performed on the ascending aorta using a 4.0 mm punch running 6-0 Prolene.  Prior to tying down the final proximal anastomosis, air was vented from the coronaries with a dose of retrograde warm blood cardioplegia.  The proximal anastomosis was tied and the crossclamp was removed.  The heart resumed a spontaneous rhythm.  The vein grafts were de-aired and opened and each had good flow.  Hemostasis was documented at the proximal and distal anastomoses.  The aortotomy was checked and found to be hemostatic.  The vent was removed and the cardioplegia lines were removed. The patient was rewarmed and  reperfused.  Temporary pacing wires were applied.  The lungs re-expanded and the ventilator was resumed.  The patient was adequately reperfused.  He was weaned from cardiopulmonary bypass after low-dose dopamine was started.  The echo showed good functioning of the LV.  The RV was inspected and found to be functioning well.  The new aortic valve was functioning well without gradient or without AI.  Protamine was administered without adverse reaction.  The cannulas were removed.  The mediastinum was irrigated and inspected and hemostasis was achieved.  The superior pericardial fat was closed over the aorta and vein grafts.  Anterior mediastinal and left pleural chest tube were placed and brought out through separate incisions and secured to the skin. The sternum was closed with interrupted steel wire.  The patient remained hemodynamically stable.  The pectoralis fascia was closed in running #1 Vicryl.  The subcutaneous and skin layers were closed in running Vicryl and sterile dressings were applied.  Total cardiopulmonary bypass time was 180 minutes.     Ivin Poot, M.D.     PV/MEDQ  D:  02/20/2013  T:  02/21/2013  Job:  [id number]  cc:   Juanda Bond. Burt Knack, MD Dr. Cleda Mccreedy

## 2013-02-22 ENCOUNTER — Inpatient Hospital Stay (HOSPITAL_COMMUNITY): Payer: Medicare Other

## 2013-02-22 LAB — CBC
HCT: 21.1 % — ABNORMAL LOW (ref 39.0–52.0)
Hemoglobin: 7.4 g/dL — ABNORMAL LOW (ref 13.0–17.0)
MCH: 30 pg (ref 26.0–34.0)
MCHC: 35.1 g/dL (ref 30.0–36.0)
MCV: 85.4 fL (ref 78.0–100.0)
Platelets: 77 10*3/uL — ABNORMAL LOW (ref 150–400)
RBC: 2.47 MIL/uL — ABNORMAL LOW (ref 4.22–5.81)
RDW: 13.5 % (ref 11.5–15.5)
WBC: 14.4 10*3/uL — ABNORMAL HIGH (ref 4.0–10.5)

## 2013-02-22 LAB — BASIC METABOLIC PANEL
BUN: 32 mg/dL — ABNORMAL HIGH (ref 6–23)
CO2: 23 mEq/L (ref 19–32)
Calcium: 8.2 mg/dL — ABNORMAL LOW (ref 8.4–10.5)
Chloride: 101 mEq/L (ref 96–112)
Creatinine, Ser: 1.31 mg/dL (ref 0.50–1.35)
GFR calc Af Amer: 58 mL/min — ABNORMAL LOW (ref >90)
GFR calc non Af Amer: 50 mL/min — ABNORMAL LOW (ref >90)
Glucose, Bld: 143 mg/dL — ABNORMAL HIGH (ref 70–99)
Potassium: 4 mEq/L (ref 3.7–5.3)
Sodium: 134 mEq/L — ABNORMAL LOW (ref 137–147)

## 2013-02-22 LAB — GLUCOSE, CAPILLARY
Glucose-Capillary: 121 mg/dL — ABNORMAL HIGH (ref 70–99)
Glucose-Capillary: 128 mg/dL — ABNORMAL HIGH (ref 70–99)
Glucose-Capillary: 137 mg/dL — ABNORMAL HIGH (ref 70–99)
Glucose-Capillary: 138 mg/dL — ABNORMAL HIGH (ref 70–99)
Glucose-Capillary: 144 mg/dL — ABNORMAL HIGH (ref 70–99)
Glucose-Capillary: 145 mg/dL — ABNORMAL HIGH (ref 70–99)

## 2013-02-22 LAB — URINE CULTURE: Colony Count: >=100000

## 2013-02-22 LAB — PREPARE RBC (CROSSMATCH)

## 2013-02-22 MED ORDER — FUROSEMIDE 10 MG/ML IJ SOLN
20.0000 mg | Freq: Every day | INTRAMUSCULAR | Status: DC
  Administered 2013-02-22: 20 mg via INTRAVENOUS
  Filled 2013-02-22 (×2): qty 2

## 2013-02-22 MED ORDER — POTASSIUM CHLORIDE 10 MEQ/50ML IV SOLN
10.0000 meq | INTRAVENOUS | Status: AC
  Administered 2013-02-22 (×2): 10 meq via INTRAVENOUS
  Filled 2013-02-22 (×2): qty 50

## 2013-02-22 MED ORDER — AMIODARONE LOAD VIA INFUSION
150.0000 mg | Freq: Once | INTRAVENOUS | Status: AC
  Administered 2013-02-22: 150 mg via INTRAVENOUS
  Filled 2013-02-22: qty 83.34

## 2013-02-22 MED ORDER — ASPIRIN 81 MG PO CHEW
81.0000 mg | CHEWABLE_TABLET | Freq: Every day | ORAL | Status: DC
Start: 2013-02-23 — End: 2013-02-23

## 2013-02-22 MED ORDER — AMIODARONE HCL IN DEXTROSE 360-4.14 MG/200ML-% IV SOLN
30.0000 mg/h | INTRAVENOUS | Status: DC
  Administered 2013-02-22 – 2013-02-24 (×5): 30 mg/h via INTRAVENOUS
  Filled 2013-02-22 (×8): qty 200

## 2013-02-22 MED ORDER — AMIODARONE HCL IN DEXTROSE 360-4.14 MG/200ML-% IV SOLN
60.0000 mg/h | INTRAVENOUS | Status: AC
  Administered 2013-02-22: 60 mg/h via INTRAVENOUS
  Filled 2013-02-22 (×2): qty 200

## 2013-02-22 MED ORDER — ASPIRIN EC 81 MG PO TBEC
81.0000 mg | DELAYED_RELEASE_TABLET | Freq: Every day | ORAL | Status: DC
  Administered 2013-02-23 – 2013-03-09 (×15): 81 mg via ORAL
  Filled 2013-02-22 (×15): qty 1

## 2013-02-22 NOTE — Progress Notes (Signed)
PT has been off Bipap throughout the day. Pt has no distress and no increased WOB. Bipap is not needed at this time. RT will continue to monitor.

## 2013-02-22 NOTE — Progress Notes (Signed)
CT surgery PM Rounds Rate controlled A fib with stable BP- on iv amiodarone Patient examined and record reviewed.Hemodynamics stable,labs satisfactory.Patient had stable day.Continue current care. [name],PETER 02/22/2013

## 2013-02-22 NOTE — Progress Notes (Signed)
Bipap not needed at this time. Pt has been on [place] for several days now. No distress noted. RT will continue to monitor.

## 2013-02-22 NOTE — Progress Notes (Signed)
2 Days Post-Op Procedure(s) (LRB): CORONARY ARTERY BYPASS GRAFTING (CABG) (N/A) AORTIC VALVE REPLACEMENT (AVR) (N/A) INTRAOPERATIVE TRANSESOPHAGEAL ECHOCARDIOGRAM (N/A) Subjective: Stable postop day 2 aVR and CABG x3 Postoperative confusion-patient pullout Foley, patient trying to get out of bed over the past night shift Sinus rhythm stable hemodynamics Postoperative expected blood loss anemia-we'll transfuse for hemoglobin 7.8 Pulmonary status stable, on room air  Objective: Vital signs in last 24 hours: Temp:  [97.9 F (36.6 C)-99 F (37.2 C)] 98.4 F (36.9 C) (01/04 1010) Pulse Rate:  [75-98] 92 (01/04 1015) Cardiac Rhythm:  [-] Normal sinus rhythm (01/04 0800) Resp:  [5-26] 18 (01/04 1015) BP: (91-132)/(38-93) 109/56 mmHg (01/04 1010) SpO2:  [91 %-100 %] 92 % (01/04 1015) Arterial Line BP: (103-157)/(36-61) 119/39 mmHg (01/03 1600) Weight:  [159 lb 13.3 oz (72.5 kg)] 159 lb 13.3 oz (72.5 kg) (01/04 0500)  Hemodynamic parameters for last 24 hours:   sinus rhythm stable blood pressure  Intake/Output from previous day: 01/03 0701 - 01/04 0700 In: 2098.3 [P.O.:1080; I.V.:518.3; IV Piggyback:500] Out: 445 [Urine:105; Chest Tube:340] Intake/Output this shift: Total I/O In: 240 [I.V.:40; IV Piggyback:200] Out: -   Exam Patient alert responsive but oriented only x1 Breath sounds clear and equal Heart rate regular, soft flow murmur through the bioprosthetic valve No edema  Lab Results:  Recent Labs  02/21/13 1600 02/21/13 1608 02/22/13 0350  WBC 15.0*  --  14.4*  HGB 7.9* 8.2* 7.4*  HCT 23.7* 24.0* 21.1*  PLT 89*  --  77*   BMET:  Recent Labs  02/21/13 1600 02/21/13 1608 02/22/13 0350  NA 135* 138 134*  K 4.2 4.1 4.0  CL 102 103 101  CO2 19  --  23  GLUCOSE 175* 176* 143*  BUN 27* 25* 32*  CREATININE 1.24 1.30 1.31  CALCIUM 8.1*  --  8.2*    PT/INR:  Recent Labs  02/20/13 1441  LABPROT 17.8*  INR 1.51*   ABG    Component Value Date/Time   PHART 7.388 02/21/2013 0423   HCO3 20.7 02/21/2013 0423   TCO2 18 02/21/2013 1608   ACIDBASEDEF 4.0* 02/21/2013 0423   O2SAT 99.0 02/21/2013 0423   CBG (last 3)   Recent Labs  02/22/13 0014 02/22/13 0338 02/22/13 0746  GLUCAP 121* 145* 144*    Assessment/Plan: S/P Procedure(s) (LRB): CORONARY ARTERY BYPASS GRAFTING (CABG) (N/A) AORTIC VALVE REPLACEMENT (AVR) (N/A) INTRAOPERATIVE TRANSESOPHAGEAL ECHOCARDIOGRAM (N/A)   Postop aVR CABG Altered mental status confusion Pre and postoperative anemia requiring transfusion Thrombocytopenia, Lovenox on hold Urinary incontinence following self removal of Foley with balloon intact  We'll keep in ICU another day due  to confusion and multiple medical issues   LOS: 2 days    [name],[employer] Null 02/22/2013

## 2013-02-23 ENCOUNTER — Inpatient Hospital Stay (HOSPITAL_COMMUNITY): Payer: Medicare Other

## 2013-02-23 LAB — CBC
HCT: 23.9 % — ABNORMAL LOW (ref 39.0–52.0)
Hemoglobin: 8.2 g/dL — ABNORMAL LOW (ref 13.0–17.0)
MCH: 29.3 pg (ref 26.0–34.0)
MCHC: 34.3 g/dL (ref 30.0–36.0)
MCV: 85.4 fL (ref 78.0–100.0)
Platelets: 78 10*3/uL — ABNORMAL LOW (ref 150–400)
RBC: 2.8 MIL/uL — ABNORMAL LOW (ref 4.22–5.81)
RDW: 13.6 % (ref 11.5–15.5)
WBC: 14.9 10*3/uL — ABNORMAL HIGH (ref 4.0–10.5)

## 2013-02-23 LAB — POCT I-STAT, CHEM 8
BUN: 17 mg/dL (ref 6–23)
Calcium, Ion: 1.05 mmol/L — ABNORMAL LOW (ref 1.13–1.30)
Chloride: 117 mEq/L — ABNORMAL HIGH (ref 96–112)
Creatinine, Ser: 1 mg/dL (ref 0.50–1.35)
Glucose, Bld: 177 mg/dL — ABNORMAL HIGH (ref 70–99)
HCT: 25 % — ABNORMAL LOW (ref 39.0–52.0)
Hemoglobin: 8.5 g/dL — ABNORMAL LOW (ref 13.0–17.0)
Potassium: 4.2 mEq/L (ref 3.7–5.3)
Sodium: 134 mEq/L — ABNORMAL LOW (ref 137–147)
TCO2: 20 mmol/L (ref 0–100)

## 2013-02-23 LAB — GLUCOSE, CAPILLARY
Glucose-Capillary: 101 mg/dL — ABNORMAL HIGH (ref 70–99)
Glucose-Capillary: 104 mg/dL — ABNORMAL HIGH (ref 70–99)
Glucose-Capillary: 146 mg/dL — ABNORMAL HIGH (ref 70–99)
Glucose-Capillary: 148 mg/dL — ABNORMAL HIGH (ref 70–99)
Glucose-Capillary: 158 mg/dL — ABNORMAL HIGH (ref 70–99)
Glucose-Capillary: 160 mg/dL — ABNORMAL HIGH (ref 70–99)
Glucose-Capillary: 79 mg/dL (ref 70–99)

## 2013-02-23 LAB — COMPREHENSIVE METABOLIC PANEL
ALT: 27 U/L (ref 0–53)
AST: 68 U/L — ABNORMAL HIGH (ref 0–37)
Albumin: 2.7 g/dL — ABNORMAL LOW (ref 3.5–5.2)
Alkaline Phosphatase: 74 U/L (ref 39–117)
BUN: 33 mg/dL — ABNORMAL HIGH (ref 6–23)
CO2: 22 mEq/L (ref 19–32)
Calcium: 7.9 mg/dL — ABNORMAL LOW (ref 8.4–10.5)
Chloride: 102 mEq/L (ref 96–112)
Creatinine, Ser: 1.28 mg/dL (ref 0.50–1.35)
GFR calc Af Amer: 59 mL/min — ABNORMAL LOW (ref >90)
GFR calc non Af Amer: 51 mL/min — ABNORMAL LOW (ref >90)
Glucose, Bld: 117 mg/dL — ABNORMAL HIGH (ref 70–99)
Potassium: 3.8 mEq/L (ref 3.7–5.3)
Sodium: 137 mEq/L (ref 137–147)
Total Bilirubin: 0.5 mg/dL (ref 0.3–1.2)
Total Protein: 5.9 g/dL — ABNORMAL LOW (ref 6.0–8.3)

## 2013-02-23 MED ORDER — DM-GUAIFENESIN ER 30-600 MG PO TB12
1.0000 | ORAL_TABLET | Freq: Two times a day (BID) | ORAL | Status: DC
  Administered 2013-02-23 – 2013-03-09 (×28): 1 via ORAL
  Filled 2013-02-23 (×31): qty 1

## 2013-02-23 MED ORDER — METOPROLOL TARTRATE 25 MG PO TABS
25.0000 mg | ORAL_TABLET | Freq: Two times a day (BID) | ORAL | Status: DC
  Administered 2013-02-23 – 2013-02-25 (×6): 25 mg via ORAL
  Filled 2013-02-23 (×8): qty 1

## 2013-02-23 MED ORDER — SODIUM CHLORIDE 0.9 % IJ SOLN: 3.0000 mL | INTRAMUSCULAR | Status: DC | PRN

## 2013-02-23 MED ORDER — LEVALBUTEROL HCL 0.63 MG/3ML IN NEBU
INHALATION_SOLUTION | RESPIRATORY_TRACT | Status: AC
  Filled 2013-02-23: qty 3

## 2013-02-23 MED ORDER — LEVALBUTEROL HCL 0.63 MG/3ML IN NEBU
0.6300 mg | INHALATION_SOLUTION | Freq: Three times a day (TID) | RESPIRATORY_TRACT | Status: DC
  Administered 2013-02-23 – 2013-03-07 (×35): 0.63 mg via RESPIRATORY_TRACT
  Filled 2013-02-23 (×55): qty 3

## 2013-02-23 MED ORDER — MOVING RIGHT ALONG BOOK
Freq: Once | Status: AC
  Administered 2013-02-23: 20:00:00
  Filled 2013-02-23: qty 1

## 2013-02-23 MED ORDER — HALOPERIDOL LACTATE 5 MG/ML IJ SOLN
2.0000 mg | Freq: Once | INTRAMUSCULAR | Status: AC
  Administered 2013-02-23: 2 mg via INTRAVENOUS
  Filled 2013-02-23: qty 0.4
  Filled 2013-02-23: qty 1

## 2013-02-23 MED ORDER — INSULIN ASPART 100 UNIT/ML ~~LOC~~ SOLN
0.0000 [IU] | Freq: Three times a day (TID) | SUBCUTANEOUS | Status: DC
  Administered 2013-02-23 – 2013-02-24 (×5): 2 [IU] via SUBCUTANEOUS

## 2013-02-23 MED ORDER — POTASSIUM CHLORIDE 10 MEQ/50ML IV SOLN
10.0000 meq | INTRAVENOUS | Status: AC
  Administered 2013-02-23 (×2): 10 meq via INTRAVENOUS
  Filled 2013-02-23 (×2): qty 50

## 2013-02-23 MED ORDER — CLONAZEPAM 0.5 MG PO TABS
0.5000 mg | ORAL_TABLET | Freq: Every day | ORAL | Status: DC
  Administered 2013-02-23 – 2013-02-25 (×3): 0.5 mg via ORAL
  Filled 2013-02-23 (×3): qty 1

## 2013-02-23 MED ORDER — BUDESONIDE-FORMOTEROL FUMARATE 160-4.5 MCG/ACT IN AERO
2.0000 | INHALATION_SPRAY | Freq: Two times a day (BID) | RESPIRATORY_TRACT | Status: DC
  Administered 2013-02-23 – 2013-03-09 (×25): 2 via RESPIRATORY_TRACT
  Filled 2013-02-23 (×2): qty 6

## 2013-02-23 MED ORDER — FUROSEMIDE 40 MG PO TABS
40.0000 mg | ORAL_TABLET | Freq: Every day | ORAL | Status: DC
  Administered 2013-02-23 – 2013-02-25 (×3): 40 mg via ORAL
  Filled 2013-02-23 (×4): qty 1

## 2013-02-23 MED ORDER — VITAMIN B-1 100 MG PO TABS
100.0000 mg | ORAL_TABLET | Freq: Every day | ORAL | Status: DC
  Administered 2013-02-23 – 2013-03-08 (×13): 100 mg via ORAL
  Filled 2013-02-23 (×15): qty 1

## 2013-02-23 MED ORDER — SALINE SPRAY 0.65 % NA SOLN
1.0000 | NASAL | Status: DC | PRN
  Filled 2013-02-23: qty 44

## 2013-02-23 MED ORDER — ALUM & MAG HYDROXIDE-SIMETH 200-200-20 MG/5ML PO SUSP
15.0000 mL | ORAL | Status: DC | PRN
  Administered 2013-03-08 (×2): 30 mL via ORAL
  Filled 2013-02-23 (×2): qty 30

## 2013-02-23 MED ORDER — SODIUM CHLORIDE 0.9 % IV SOLN: 250.0000 mL | INTRAVENOUS | Status: DC | PRN

## 2013-02-23 MED ORDER — SODIUM CHLORIDE 0.9 % IJ SOLN
3.0000 mL | Freq: Two times a day (BID) | INTRAMUSCULAR | Status: DC
  Administered 2013-02-23 – 2013-02-26 (×4): 3 mL via INTRAVENOUS

## 2013-02-23 MED ORDER — AMIODARONE LOAD VIA INFUSION
150.0000 mg | Freq: Once | INTRAVENOUS | Status: AC
  Administered 2013-02-23: 150 mg via INTRAVENOUS
  Filled 2013-02-23: qty 83.34

## 2013-02-23 MED ORDER — POTASSIUM CHLORIDE CRYS ER 20 MEQ PO TBCR
20.0000 meq | EXTENDED_RELEASE_TABLET | Freq: Every day | ORAL | Status: DC
  Administered 2013-02-24 – 2013-02-25 (×2): 20 meq via ORAL
  Filled 2013-02-23 (×3): qty 1

## 2013-02-23 MED FILL — Potassium Chloride Inj 2 mEq/ML: INTRAVENOUS | Qty: 40 | Status: AC

## 2013-02-23 MED FILL — Magnesium Sulfate Inj 50%: INTRAMUSCULAR | Qty: 10 | Status: AC

## 2013-02-23 MED FILL — Heparin Sodium (Porcine) Inj 1000 Unit/ML: INTRAMUSCULAR | Qty: 30 | Status: AC

## 2013-02-23 NOTE — Progress Notes (Signed)
02/23/2013 6:42 PM Nursing note Respiratory therapist contacted and made aware of new orders for breathing treatments and patient need. Therapist en route to administer treatment.

## 2013-02-23 NOTE — Progress Notes (Signed)
PT Cancellation Note  Patient Details Name: Robert Richard MRN: [id number] DOB: [birth year]   Cancelled Treatment:    Reason Eval Not Completed: Pain limiting ability to participate. Pt reports he has walked x 3 today (RN confirmed), did not sleep well last night, has been coughing all day and chest is hurting too much to get up. RN made aware of pt's request for pain medicine.   Teiara Baria 02/23/2013, 4:52 PM Pager [phone]

## 2013-02-23 NOTE — Progress Notes (Signed)
02/23/2013 1830 Dr. Prescott Gum on floor and made aware of pt. Continued issues with cough and intermittent wheezing upon transfer to 2W. MD to see patient.  Cieanna Stormes, Arville Lime

## 2013-02-23 NOTE — Progress Notes (Signed)
Dr Prescott Gum contacted r/t increase in Pt confusion,hallucinations and non-compliance with current plan of care (ei: BIPAP therapy, condom cath for incontinence); discussed Pt lack of sleep, current pulmonary status and vitals. Orders given for meds, PCXR and for continuing BIPAP. Will continue to monitor and assess.

## 2013-02-23 NOTE — Progress Notes (Signed)
Pt had taken Bipap off around 2 am. Attempting to place pt back on Bipap after meds are given to help pt rest.

## 2013-02-23 NOTE — Progress Notes (Signed)
Pt transferred via wheelchair to room 2W24.  VSS throughout transfer. Report given to [place], Therapist, sports.  Pt left positioned comfortably in bed, on monitor with Colletta Maryland, RN in room.

## 2013-02-23 NOTE — Plan of Care (Signed)
Problem: Phase II - Intermediate Post-Op Goal: Wean to Extubate Outcome: Completed/Met Date Met:  02/23/13 1930 on 02-20-13  Problem: Phase III - Recovery through Discharge Goal: Maintain Hemodynamic Stability Outcome: Not Progressing New onset Afib 02-22-13, Amiodarone protocol initiated Goal: Discharge plan remains appropriate-arrangements made Outcome: Progressing Pt prepared for transfer to [facility]

## 2013-02-23 NOTE — Progress Notes (Signed)
Placed pt on Bipap due to pt having periods of apnea.

## 2013-02-23 NOTE — Progress Notes (Signed)
3 Days Post-Op Procedure(s) (LRB): CORONARY ARTERY BYPASS GRAFTING (CABG) (N/A) AORTIC VALVE REPLACEMENT (AVR) (N/A) INTRAOPERATIVE TRANSESOPHAGEAL ECHOCARDIOGRAM (N/A) Subjective: Postop AVR-CABG COPD  Postop afib, altered mental status, anemia and thrombocytopenia Walking in ICU off O2  Objective: Vital signs in last 24 hours: Temp:  [98 F (36.7 C)-100 F (37.8 C)] 98 F (36.7 C) (01/05 0745) Pulse Rate:  [71-112] 86 (01/05 0700) Cardiac Rhythm:  [-] Atrial fibrillation (01/05 0730) Resp:  [13-38] 15 (01/05 0700) BP: (84-131)/(47-74) 84/52 mmHg (01/05 0700) SpO2:  [90 %-100 %] 99 % (01/05 0700) Weight:  [164 lb 3.9 oz (74.5 kg)] 164 lb 3.9 oz (74.5 kg) (01/05 0600)  Hemodynamic parameters for last 24 hours:  afib on IV amiodarone  Intake/Output from previous day: 01/04 0701 - 01/05 0700 In: 2662.4 [P.O.:1080; I.V.:907.4; Blood:325; IV Piggyback:350] Out: 760 [Urine:760] Intake/Output this shift:    EXAM Lungs clear Incisions clean  and dry No edema  Lab Results:  Recent Labs  02/22/13 0350 02/23/13 0415  WBC 14.4* 14.9*  HGB 7.4* 8.2*  HCT 21.1* 23.9*  PLT 77* 78*   BMET:  Recent Labs  02/22/13 0350 02/23/13 0415  NA 134* 137  K 4.0 3.8  CL 101 102  CO2 23 22  GLUCOSE 143* 117*  BUN 32* 33*  CREATININE 1.31 1.28  CALCIUM 8.2* 7.9*    PT/INR:  Recent Labs  02/20/13 1441  LABPROT 17.8*  INR 1.51*   ABG    Component Value Date/Time   PHART 7.388 02/21/2013 0423   HCO3 20.7 02/21/2013 0423   TCO2 18 02/21/2013 1608   ACIDBASEDEF 4.0* 02/21/2013 0423   O2SAT 99.0 02/21/2013 0423   CBG (last 3)   Recent Labs  02/22/13 2020 02/23/13 0017 02/23/13 0414  GLUCAP 128* 79 101*    Assessment/Plan: S/P Procedure(s) (LRB): CORONARY ARTERY BYPASS GRAFTING (CABG) (N/A) AORTIC VALVE REPLACEMENT (AVR) (N/A) INTRAOPERATIVE TRANSESOPHAGEAL ECHOCARDIOGRAM (N/A)   Plan transfer to stepdown May need to resume Xarelto for a-fib   LOS: 3 days     [name],Margie Brink 02/23/2013

## 2013-02-24 ENCOUNTER — Inpatient Hospital Stay (HOSPITAL_COMMUNITY): Payer: Medicare Other

## 2013-02-24 LAB — BASIC METABOLIC PANEL
BUN: 30 mg/dL — ABNORMAL HIGH (ref 6–23)
CO2: 22 mEq/L (ref 19–32)
Calcium: 8 mg/dL — ABNORMAL LOW (ref 8.4–10.5)
Chloride: 100 mEq/L (ref 96–112)
Creatinine, Ser: 1.28 mg/dL (ref 0.50–1.35)
GFR calc Af Amer: 59 mL/min — ABNORMAL LOW (ref >90)
GFR calc non Af Amer: 51 mL/min — ABNORMAL LOW (ref >90)
Glucose, Bld: 142 mg/dL — ABNORMAL HIGH (ref 70–99)
Potassium: 3.8 mEq/L (ref 3.7–5.3)
Sodium: 136 mEq/L — ABNORMAL LOW (ref 137–147)

## 2013-02-24 LAB — GLUCOSE, CAPILLARY
Glucose-Capillary: 134 mg/dL — ABNORMAL HIGH (ref 70–99)
Glucose-Capillary: 115 mg/dL — ABNORMAL HIGH (ref 70–99)
Glucose-Capillary: 146 mg/dL — ABNORMAL HIGH (ref 70–99)
Glucose-Capillary: 123 mg/dL — ABNORMAL HIGH (ref 70–99)

## 2013-02-24 LAB — CBC
HCT: 24.4 % — ABNORMAL LOW (ref 39.0–52.0)
Hemoglobin: 8.4 g/dL — ABNORMAL LOW (ref 13.0–17.0)
MCH: 29.4 pg (ref 26.0–34.0)
MCHC: 34.4 g/dL (ref 30.0–36.0)
MCV: 85.3 fL (ref 78.0–100.0)
Platelets: 98 10*3/uL — ABNORMAL LOW (ref 150–400)
RBC: 2.86 MIL/uL — ABNORMAL LOW (ref 4.22–5.81)
RDW: 14 % (ref 11.5–15.5)
WBC: 13.7 10*3/uL — ABNORMAL HIGH (ref 4.0–10.5)

## 2013-02-24 MED ORDER — OXYCODONE-ACETAMINOPHEN 5-325 MG PO TABS
1.0000 | ORAL_TABLET | ORAL | Status: DC | PRN
  Administered 2013-02-24: 1 via ORAL
  Filled 2013-02-24: qty 1

## 2013-02-24 MED ORDER — OXYCODONE-ACETAMINOPHEN 5-325 MG PO TABS
2.0000 | ORAL_TABLET | ORAL | Status: DC | PRN
  Administered 2013-02-24: 2 via ORAL
  Administered 2013-02-24: 1 via ORAL
  Filled 2013-02-24 (×2): qty 2

## 2013-02-24 MED ORDER — AMIODARONE HCL 200 MG PO TABS
200.0000 mg | ORAL_TABLET | Freq: Two times a day (BID) | ORAL | Status: DC
  Administered 2013-02-24 – 2013-02-26 (×5): 200 mg via ORAL
  Filled 2013-02-24 (×7): qty 1

## 2013-02-24 MED ORDER — DEXTROSE 5 % IV SOLN
1.0000 g | Freq: Three times a day (TID) | INTRAVENOUS | Status: AC
  Administered 2013-02-24 – 2013-03-06 (×31): 1 g via INTRAVENOUS
  Filled 2013-02-24 (×34): qty 1

## 2013-02-24 MED FILL — Mannitol IV Soln 20%: INTRAVENOUS | Qty: 500 | Status: AC

## 2013-02-24 MED FILL — Sodium Chloride IV Soln 0.9%: INTRAVENOUS | Qty: 2000 | Status: AC

## 2013-02-24 MED FILL — Heparin Sodium (Porcine) Inj 1000 Unit/ML: INTRAMUSCULAR | Qty: 30 | Status: AC

## 2013-02-24 MED FILL — Sodium Bicarbonate IV Soln 8.4%: INTRAVENOUS | Qty: 50 | Status: AC

## 2013-02-24 MED FILL — Lidocaine HCl IV Inj 20 MG/ML: INTRAVENOUS | Qty: 5 | Status: AC

## 2013-02-24 MED FILL — Electrolyte-R (PH 7.4) Solution: INTRAVENOUS | Qty: 3000 | Status: AC

## 2013-02-24 NOTE — Evaluation (Signed)
Physical Therapy Evaluation Patient Details Name: Robert Richard MRN: [id number] DOB: Apr 20, 1930 Today's Date: 02/24/2013 Time: 1540-1601 PT Time Calculation (min): 21 min  PT Assessment / Plan / Recommendation History of Present Illness  Pt adm for CABG x3 and AVR. PMHx LLE thrombectomy; afib; recent decr exercise tolerance due to CAD, aortic stenosis, COPD  Clinical Impression  Patient is s/p above surgery resulting in functional limitations due to the deficits listed below (see PT Problem List).  Patient will benefit from skilled PT to increase their independence and safety with mobility to allow discharge to the venue listed below.       PT Assessment  Patient needs continued PT services    Follow Up Recommendations  Home health PT;Supervision for mobility/OOB    Does the patient have the potential to tolerate intense rehabilitation      Barriers to Discharge        Equipment Recommendations  Rolling walker with 5" wheels;3in1 (PT)    Recommendations for Other Services OT consult   Frequency Min 3X/week    Precautions / Restrictions Precautions Precautions: Sternal;Fall Precaution Comments: pt groggy and unable to state sternal prec; educated   Pertinent Vitals/Pain Chest painful when coughing (even with pillow to splint); denies discomfort otherwise      Mobility  Bed Mobility Bed Mobility: Not assessed Transfers Transfers: Sit to Stand;Stand to Sit Sit to Stand: 4: Min assist Stand to Sit: 4: Min assist Details for Transfer Assistance: vc for technique to maintain sternal precautions; assist to initiate lift off (due to weakness and decr ant weight shift Ambulation/Gait Ambulation/Gait Assistance: 4: Min assist Ambulation Distance (Feet): 150 Feet Assistive device: Rolling walker Ambulation/Gait Assistance Details: vc throughout for incr step length and heelstrike; pt with shuffling gait and Rt foot drag (son present and reports Rt foot drag present since LLE  thrombectomy) Gait Pattern: Step-through pattern;Decreased stride length;Shuffle;Right foot flat;Left foot flat    Exercises     PT Diagnosis: Difficulty walking;Acute pain  PT Problem List: Decreased strength;Decreased activity tolerance;Decreased balance;Decreased mobility;Decreased knowledge of use of DME;Decreased knowledge of precautions;Pain PT Treatment Interventions: DME instruction;Gait training;Stair training;Functional mobility training;Therapeutic activities;Patient/family education     PT Goals(Current goals can be found in the care plan section) Acute Rehab PT Goals Patient Stated Goal: regain independence PT Goal Formulation: With patient Time For Goal Achievement: 03/03/13 Potential to Achieve Goals: Good  Visit Information  Last PT Received On: 02/24/13 Assistance Needed: +1 History of Present Illness: Pt adm for CABG x3 and AVR. PMHx LLE thrombectomy; afib; recent decr exercise tolerance due to CAD, aortic stenosis, COPD       Prior Functioning  Home Living Family/patient expects to be discharged to:: Private residence Living Arrangements: Spouse/significant other Available Help at Discharge: Family;Available 24 hours/day (son local but works full-time) Type of Home: [employer] Home Access: Stairs to enter [employer] of Steps: 3 Entrance Stairs-Rails: Left Home Layout: Multi-level;Laundry or work area in basement;Able to live on main level with bedroom/bathroom Home Equipment: Kasandra Knudsen - single point Prior Function Level of Independence: Independent Comments: active up until 2 months ago;  Communication Communication: No difficulties    Cognition  Cognition Arousal/Alertness: Lethargic;Suspect due to medications Behavior During Therapy: [facility] for tasks assessed/performed Overall Cognitive Status: Within Functional Limits for tasks assessed    Extremity/Trunk Assessment Upper Extremity Assessment Upper Extremity Assessment: Overall WFL for tasks  assessed (within sternal precuations) Lower Extremity Assessment Lower Extremity Assessment: Overall WFL for tasks assessed (5/5 dorsiflexion, knee extension, ?weaker  hip extensors) Cervical / Trunk Assessment Cervical / Trunk Assessment: Kyphotic   Balance Balance Balance Assessed: Yes Static Standing Balance Static Standing - Balance Support: No upper extremity supported Static Standing - Level of Assistance: 5: Stand by assistance [place] - Eyes Opened: 30  End of Session PT - End of Session Equipment Utilized During Treatment: Gait belt Activity Tolerance: Patient limited by fatigue Patient left: in chair;with call bell/phone within reach;with family/visitor present  GP     Sherylann Vangorden 02/24/2013, 4:17 PM Pager [phone]

## 2013-02-24 NOTE — Evaluation (Signed)
Clinical/Bedside Swallow Evaluation Patient Details  Name: Robert Richard MRN: [id number] Date of Birth: 13-Mar-1930  Today's Date: 02/24/2013 Time: 1660-6301 SLP Time Calculation (min): 20 min  Past Medical History:  Past Medical History  Diagnosis Date  . Anemia   . Colon polyp   . COPD (chronic obstructive pulmonary disease)     Astmatic bronchitis  . Asthma with bronchitis   . Gout     PMH  . Osteoporosis   . Allergic rhinitis   . Skin cancer, basal cell     Dr Nevada Crane, PMH  . Nephrolithiasis   . HLD (hyperlipidemia)   . Skin cancer (melanoma) 2012    Right neck  . Arthritis     SHOULDER  . Headache(784.0)   . RLS (restless legs syndrome)   . Dysrhythmia 08/2012    NEW ONSET ATRIAL FIBRILATION   Past Surgical History:  Past Surgical History  Procedure Laterality Date  . Finger surgery      for foreign body  . Colonoscopy w/ polypectomy      Dr Deatra Ina  . Inguinal hernia repair    . Nose surgery      Septal Deviation  . Neck surgery  12/2010    Melanoma ; [facility]   . Leg surgery  06/2010     Dr.Lawson, for blood clott. Left leg   . Tee without cardioversion N/A 01/23/2013    Procedure: TRANSESOPHAGEAL ECHOCARDIOGRAM (TEE);  Surgeon: Jolaine Artist, MD;  Location: [facility] ENDOSCOPY;  Service: Cardiovascular;  Laterality: N/A;  sign heart cath consent w/tee consent   HPI:  80 yr old underwent CABG x3 and aortic valve replacement.   Associated small left pleural effusion.  Now with Altered mental status confusion.  CXR New onset bilateral upper lobe and left lower lobe pulmonary infiltrates suggesting pneumonia   Assessment / Plan / Recommendation Clinical Impression  Pt. in chair with mild increase work of breathing and lethargy following pain meds and PT session.  There were no overt s/s aspiration, however his decreased endurance, SOB coupled with current lung status and apneic period during swallow increase aspiration risk.  Educated pt. and son to consume one  pill at a time with water (took potassium and 5 other pills simultaneously per RN).  Educated on sitting upright, eating/drinking at a slow rate, taking breaks when needed and drinking from the cup.  Belching present possibly indicative of esophageal involvement (pt. denies diff with esophagus).  SLP recommends continuing regular texture diet and thin liquids with continued ST services.    Aspiration Risk  Moderate    Diet Recommendation Regular;Thin liquid   Liquid Administration via: Cup;No straw Medication Administration: Whole meds with liquid Supervision: Patient able to self feed;Intermittent supervision to cue for compensatory strategies Compensations: Slow rate;Small sips/bites Postural Changes and/or Swallow Maneuvers: Seated upright 90 degrees;Upright 30-60 min after meal    Other  Recommendations Oral Care Recommendations: Oral care BID   Follow Up Recommendations  None    Frequency and Duration min 2x/week  2 weeks   Pertinent Vitals/Pain WDL         Swallow Study Prior Functional Status  Type of Home: House Available Help at Discharge: Family;Available 24 hours/day (son local but works full-time)    General HPI: 80 yr old underwent CABG x3 and aortic valve replacement.   Associated small left pleural effusion.  Now with Altered mental status confusion.  CXR New onset bilateral upper lobe and left lower lobe pulmonary infiltrates suggesting pneumonia  Type of Study: Bedside swallow evaluation Previous Swallow Assessment:  (none) Diet Prior to this Study: Regular;Thin liquids Temperature Spikes Noted: No History of Recent Intubation: Yes Behavior/Cognition: Alert;Cooperative;Pleasant mood Oral Cavity - Dentition: Adequate natural dentition Self-Feeding Abilities: Able to feed self Patient Positioning: Upright in chair Baseline Vocal Quality: Clear Volitional Cough: Strong Volitional Swallow: Able to elicit    Oral/Motor/Sensory Function Overall Oral  Motor/Sensory Function: Appears within functional limits for tasks assessed   Ice Chips Ice chips: Not tested   Thin Liquid Thin Liquid: Within functional limits Presentation: Cup;Straw    Nectar Thick Nectar Thick Liquid: Not tested   Honey Thick Honey Thick Liquid: Not tested   Puree Puree: Within functional limits   Solid   GO    Solid: Within functional limits       [place] Siren M.Ed [employer] [phone]  02/24/2013

## 2013-02-24 NOTE — Progress Notes (Signed)
CARDIAC REHAB PHASE I   PRE:  Rate/Rhythm: 87SR  BP:  Supine:   Sitting: 120/60  Standing:    SaO2: 93%RA  MODE:  Ambulation: 500 ft   POST:  Rate/Rhythm: 102  BP:  Supine:   Sitting: 134/58  Standing:    SaO2: 92-93%RA hall and room 1035-1125 Pt wanting to walk. Ambulated 500 ft on RA with gait belt use, rolling walker and asst x 2. Pt takes very little steps and needs encouragement to take bigger steps. Pt able to talk and walk, and when he gets distracted he takes little steps.Encouraged to stand upright. To recliner after walk. Wife in room. Pt increased distance as he requested to go farther. Can be asst x 1 next walk. Checked sats several times and above 90% RA. Encouraged IS also.   Graylon Good, RN BSN  02/24/2013 11:20 AM

## 2013-02-24 NOTE — Progress Notes (Addendum)
Robert Richard [address]       [place] [address]             [phone]      4 Days Post-Op  Procedure(s) (LRB): CORONARY ARTERY BYPASS GRAFTING (CABG) (N/A) AORTIC VALVE REPLACEMENT (AVR) (N/A) INTRAOPERATIVE TRANSESOPHAGEAL ECHOCARDIOGRAM (N/A) Subjective: Feels fair, Prod cough, + sternal incis pain  Objective  Telemetry afib-sinus currently  Temp:  [97.4 F (36.3 C)-98 F (36.7 C)] 97.4 F (36.3 C) (01/06 0527) Pulse Rate:  [68-133] 82 (01/06 0527) Resp:  [16-25] 22 (01/06 0527) BP: (85-136)/(45-83) 136/65 mmHg (01/06 0527) SpO2:  [91 %-99 %] 91 % (01/06 0527) Weight:  [158 lb 4.6 oz (71.8 kg)] 158 lb 4.6 oz (71.8 kg) (01/06 0500)   Intake/Output Summary (Last 24 hours) at 02/24/13 0757 Last data filed at 02/24/13 0501  Gross per 24 hour  Intake 1010.3 ml  Output   1300 ml  Net -289.7 ml       General appearance: alert, cooperative and no distress Heart: regular rate and rhythm Lungs: coarse, with ronchi Abdomen: benign Extremities: no edema Wound: incis healing well  Lab Results:  Recent Labs  02/21/13 1600  02/23/13 0415 02/24/13 0328  NA 135*  < > 137 136*  K 4.2  < > 3.8 3.8  CL 102  < > 102 100  CO2 19  < > 22 22  GLUCOSE 175*  < > 117* 142*  BUN 27*  < > 33* 30*  CREATININE 1.24  < > 1.28 1.28  CALCIUM 8.1*  < > 7.9* 8.0*  MG 2.5  --   --   --   < > = values in this interval not displayed.  Recent Labs  02/23/13 0415  AST 68*  ALT 27  ALKPHOS 74  BILITOT 0.5  PROT 5.9*  ALBUMIN 2.7*   No results found for this basename: LIPASE, AMYLASE,  in the last 72 hours  Recent Labs  02/23/13 0415 02/24/13 0328  WBC 14.9* 13.7*  HGB 8.2* 8.4*  HCT 23.9* 24.4*  MCV 85.4 85.3  PLT 78* 98*   No results found for this basename: CKTOTAL, CKMB, TROPONINI,  in the last 72 hours No components found with this basename: POCBNP,  No results found for this basename: DDIMER,  in the last 72 hours No results found for this  basename: HGBA1C,  in the last 72 hours No results found for this basename: CHOL, HDL, LDLCALC, TRIG, CHOLHDL,  in the last 72 hours No results found for this basename: TSH, T4TOTAL, FREET3, T3FREE, THYROIDAB,  in the last 72 hours No results found for this basename: VITAMINB12, FOLATE, FERRITIN, TIBC, IRON, RETICCTPCT,  in the last 72 hours  Medications: Scheduled . acetaminophen  1,000 mg Oral Q6H   Or  . acetaminophen (TYLENOL) oral liquid 160 mg/5 mL  1,000 mg Per Tube Q6H  . aspirin EC  81 mg Oral Daily  . bisacodyl  10 mg Oral Daily   Or  . bisacodyl  10 mg Rectal Daily  . budesonide-formoterol  2 puff Inhalation BID  . busPIRone  7.5 mg Oral BID  . calcium citrate  200 mg of elemental calcium Oral Daily  . cholecalciferol  1,000 Units Oral Daily  . clonazePAM  0.5 mg Oral QHS  . dextromethorphan-guaiFENesin  1 tablet Oral BID  . docusate sodium  200 mg Oral Daily  . ferrous [id number] C-folic acid  1 capsule Oral TID PC  .  furosemide  40 mg Oral Daily  . insulin aspart  0-24 Units Subcutaneous TID AC & HS  . ketotifen  1 drop Both Eyes Daily  . levalbuterol  0.63 mg Nebulization TID  . loratadine  10 mg Oral Daily  . metoprolol tartrate  25 mg Oral BID  . pantoprazole  40 mg Oral Daily  . polyethylene glycol powder  17 g Oral Daily  . potassium chloride  20 mEq Oral Daily  . simvastatin  20 mg Oral q1800  . sodium chloride  3 mL Intravenous Q12H  . sodium chloride  3 mL Intravenous Q12H  . thiamine  100 mg Oral Daily  . topiramate  75 mg Oral Daily     Radiology/Studies:  Dg Chest Port 1 View  02/23/2013   CLINICAL DATA:  Followup coronary bypass grafting  EXAM: PORTABLE CHEST - 1 VIEW  COMPARISON:  02/22/2013  FINDINGS: Cardiac shadow is stable. Postoperative changes are again seen. A right jugular sheath is again noted and stable. Continued improved aeration is noted. No pneumothorax is seen. No significant pleural effusion is noted.  IMPRESSION:  Postoperative changes.  No acute abnormality is noted.   Electronically Signed   By: Inez Catalina M.D.   On: 02/23/2013 07:43    INR: Will add last result for INR, ABG once components are confirmed Will add last 4 CBG results once components are confirmed  Assessment/Plan: S/P Procedure(s) (LRB): CORONARY ARTERY BYPASS GRAFTING (CABG) (N/A) AORTIC VALVE REPLACEMENT (AVR) (N/A) INTRAOPERATIVE TRANSESOPHAGEAL ECHOCARDIOGRAM (N/A)  1 afib- currently on amio gtt in sinus 2 conts resp rx, pulm toilet 3 enc more freq pain meds when in pain 4 creat stable 5 h/h-stable abl anemia, leukocytosis improved, platelets improved      LOS: 4 days    GOLD,[name] 1/6/20157:57 AM  Productive cough, elevated WBC, infiltrate-poss pneumonia on CXR- cover with [place]  patient examined and medical record reviewed,agree with above note. [name],[name] 02/24/2013

## 2013-02-25 ENCOUNTER — Inpatient Hospital Stay (HOSPITAL_COMMUNITY): Payer: Medicare Other

## 2013-02-25 ENCOUNTER — Encounter (HOSPITAL_COMMUNITY): Payer: Self-pay | Admitting: Cardiothoracic Surgery

## 2013-02-25 LAB — BASIC METABOLIC PANEL
BUN: 28 mg/dL — ABNORMAL HIGH (ref 6–23)
CO2: 23 mEq/L (ref 19–32)
Calcium: 8.4 mg/dL (ref 8.4–10.5)
Chloride: 105 mEq/L (ref 96–112)
Creatinine, Ser: 1.26 mg/dL (ref 0.50–1.35)
GFR calc Af Amer: 60 mL/min — ABNORMAL LOW (ref >90)
GFR calc non Af Amer: 52 mL/min — ABNORMAL LOW (ref >90)
Glucose, Bld: 125 mg/dL — ABNORMAL HIGH (ref 70–99)
Potassium: 4.1 mEq/L (ref 3.7–5.3)
Sodium: 139 mEq/L (ref 137–147)

## 2013-02-25 LAB — CBC
HCT: 22.7 % — ABNORMAL LOW (ref 39.0–52.0)
Hemoglobin: 7.7 g/dL — ABNORMAL LOW (ref 13.0–17.0)
MCH: 29.2 pg (ref 26.0–34.0)
MCHC: 33.9 g/dL (ref 30.0–36.0)
MCV: 86 fL (ref 78.0–100.0)
Platelets: 119 10*3/uL — ABNORMAL LOW (ref 150–400)
RBC: 2.64 MIL/uL — ABNORMAL LOW (ref 4.22–5.81)
RDW: 14.4 % (ref 11.5–15.5)
WBC: 12 10*3/uL — ABNORMAL HIGH (ref 4.0–10.5)

## 2013-02-25 LAB — TYPE AND SCREEN
ABO/RH(D): O POS
Antibody Screen: NEGATIVE
Unit division: 0
Unit division: 0
Unit division: 0
Unit division: 0

## 2013-02-25 LAB — GLUCOSE, CAPILLARY
Glucose-Capillary: 116 mg/dL — ABNORMAL HIGH (ref 70–99)
Glucose-Capillary: 106 mg/dL — ABNORMAL HIGH (ref 70–99)
Glucose-Capillary: 119 mg/dL — ABNORMAL HIGH (ref 70–99)
Glucose-Capillary: 119 mg/dL — ABNORMAL HIGH (ref 70–99)

## 2013-02-25 MED ORDER — HYDRALAZINE HCL 25 MG PO TABS
25.0000 mg | ORAL_TABLET | Freq: Three times a day (TID) | ORAL | Status: DC
  Administered 2013-02-25 – 2013-02-26 (×4): 25 mg via ORAL
  Filled 2013-02-25 (×7): qty 1

## 2013-02-25 NOTE — Progress Notes (Signed)
Speech Language Pathology Treatment: Dysphagia  Patient Details Name: Robert Richard MRN: [id number] DOB: 02/06/1931 Today's Date: 02/25/2013 Time: 0998-3382 SLP Time Calculation (min): 15 min  Assessment / Plan / Recommendation Clinical Impression  Pt. Appears to be tolerating a regular diet and thin liquids relatively well.  No overt s/s of aspiration with sips of water, but respirations did appear to change (increased RR after swallows, and voice slightly strained.  Pt. c/o of a sore throat (onset 1 day ago).  Lung sounds are diminished; and pt. Is afebrile.  Last CXR ?'d pna. Vs. Atx.  Repeat CXR pending.  If CXR reveals pna.consider MBS to r/o silent aspiration.   HPI HPI: 80 yr old underwent CABG x3 and aortic valve replacement.   Associated small left pleural effusion.  Now with Altered mental status confusion.  CXR New onset bilateral upper lobe and left lower lobe pulmonary infiltrates suggesting pneumonia.     Pertinent Vitals See above.  SLP Plan  Continue with current plan of care    Recommendations Diet recommendations: Regular;Thin liquid Liquids provided via: Cup;No straw Medication Administration: Whole meds with liquid Supervision: Patient able to self feed;Intermittent supervision to cue for compensatory strategies Compensations: Slow rate;Small sips/bites Postural Changes and/or Swallow Maneuvers: Seated upright 90 degrees;Upright 30-60 min after meal              Oral Care Recommendations: Oral care BID Follow up Recommendations: None Plan: Continue with current plan of care    GO     Quinn Axe T 02/25/2013, 4:11 PM

## 2013-02-25 NOTE — Progress Notes (Addendum)
      Robert Richard       [place] [address]             [phone]      5 Days Post-Op Procedure(s) (LRB): CORONARY ARTERY BYPASS GRAFTING (CABG) (N/A) AORTIC VALVE REPLACEMENT (AVR) (N/A) INTRAOPERATIVE TRANSESOPHAGEAL ECHOCARDIOGRAM (N/A)  Subjective:  Robert Richard has no complaints this morning.  He states he is doing pretty well. + Ambulation, + BM  Objective: Vital signs in last 24 hours: Temp:  [98.1 F (36.7 C)-99.6 F (37.6 C)] 98.1 F (36.7 C) (01/07 0619) Pulse Rate:  [81-94] 81 (01/07 0619) Cardiac Rhythm:  [-] Normal sinus rhythm (01/06 2130) Resp:  [18-21] 21 (01/07 0619) BP: (120-144)/(60-76) 144/76 mmHg (01/07 0619) SpO2:  [91 %-96 %] 91 % (01/07 0619) Weight:  [157 lb 6.5 oz (71.4 kg)] 157 lb 6.5 oz (71.4 kg) (01/07 0520)  Intake/Output from previous day: 01/06 0701 - 01/07 0700 In: 660 [P.O.:660] Out: 1450 [Urine:1450]  General appearance: alert, cooperative and no distress Heart: regular rate and rhythm Lungs: clear to auscultation bilaterally Abdomen: soft, non-tender; bowel sounds normal; no masses,  no organomegaly Extremities: edema trace Wound: clean and dry  Lab Results:  Recent Labs  02/24/13 0328 02/25/13 0408  WBC 13.7* 12.0*  HGB 8.4* 7.7*  HCT 24.4* 22.7*  PLT 98* 119*   BMET:  Recent Labs  02/24/13 0328 02/25/13 0408  NA 136* 139  K 3.8 4.1  CL 100 105  CO2 22 23  GLUCOSE 142* 125*  BUN 30* 28*  CREATININE 1.28 1.26  CALCIUM 8.0* 8.4    PT/INR: No results found for this basename: LABPROT, INR,  in the last 72 hours ABG    Component Value Date/Time   PHART 7.388 02/21/2013 0423   HCO3 20.7 02/21/2013 0423   TCO2 18 02/21/2013 1608   ACIDBASEDEF 4.0* 02/21/2013 0423   O2SAT 99.0 02/21/2013 0423   CBG (last 3)   Recent Labs  02/24/13 1634 02/24/13 2120 02/25/13 0616  GLUCAP 146* 123* 116*    Assessment/Plan: S/P Procedure(s) (LRB): CORONARY ARTERY BYPASS GRAFTING (CABG) (N/A) AORTIC VALVE  REPLACEMENT (AVR) (N/A) INTRAOPERATIVE TRANSESOPHAGEAL ECHOCARDIOGRAM (N/A)  1. CV- Previous A. Fib, currently NSR rate controlled, mildly hypertensive- on Lopressor, Amiodarone, hold ACE for now 2. Pulm- off oxygen, bilateral upper lobe infiltrates suggestive of pneumonia, no leukocytosis on Fortaz 3. Renal- creatinine trending down at 1.26, mildly volume overloaded on lasix 4. Expected Acute blood loss anemia- Hgb down to 7.7, has recently been transfused, will watch on iron supplement 5. Dysphagia- SSP evaluation recs no further follow up, patient tolerating diet 6. CBGs controlled, patient not a diabetic, borderline A1c will d/c insulin and finger sticks 7. Dispo- patient medically stable at this time, will d/c EPW today, continue IV Tressie Ellis for now, possibly d/c home Friday   LOS: 5 days    Robert Richard, Robert Richard 02/25/2013  Pulmonary status is improving Cont IV Fortaz  48 hrs for bronchopneumonia CXR Fri and hopefully home with home PT and RN Cont po Levaquin at home Apresoline for rising BP  patient examined and medical record reviewed,agree with above note. Robert Richard,Robert Richard 02/25/2013

## 2013-02-25 NOTE — Progress Notes (Signed)
Patient refused to walk.

## 2013-02-25 NOTE — Care Management Note (Signed)
    Page 1 of 2   03/09/2013     4:33:22 PM   CARE MANAGEMENT NOTE 03/09/2013  Patient:  Robert Richard, Robert Richard   Account Number:  [id number]  Date Initiated:  02/25/2013  Documentation initiated by:  Rosann Gorum  Subjective/Objective Assessment:   PT S/P CABG X3 AND AVR ON 02/20/13.  PTA, PT INDEPENDENT, LIVES WITH SPOUSE.     Action/Plan:   WILL FOLLOW FOR DC NEEDS AS PT PROGRESSES.  WOULD BENEFIT FROM [facility] FOR RESTORATIVE CARE, RW.   Anticipated DC Date:  03/09/2013   Anticipated DC Plan:  [place]  CM consult      [facility] Choice  HOME HEALTH   Choice offered to / List presented to:  C-3 Spouse   DME arranged  Vassie Moselle      DME agency  [place] arranged  HH-1 RN  [place].   Status of service:  Completed, signed off Medicare Important Message given?   (If response is "NO", the following Medicare IM given date fields will be blank) Date Medicare IM given:   Date Additional Medicare IM given:    Discharge Disposition:  [place]  Per UR Regulation:  Reviewed for med. necessity/level of care/duration of stay  If discussed at [place] of Stay Meetings, dates discussed:    Comments:  ContactHolland, Nickson Spouse [phone]   [phone]  03/09/13 Monti Villers,RN,BSN [phone] NOTIFIED [place].  03-05-13 3pm Luz Lex, RNBSN [phone] Pt tx to 2W progressive unit.  Wife with patient - pateint states walked from 2S to current position in chair. Touched base with both about possible need for more rehab prior to going home at an outside facility - SNF - both declined. Wife states she feels she will be ok and states their son is available to assist her also.  Still agreeable to [facility] and agreed on [facility].  Referral made.  03-03-13 2pm [place] Wife in room with patient.  Talked with patient and wife and  plan is for wife to be with patient 24/7 on discharge. Confirmed they did recieve list of [place] agencies.  Talked about having [facility] in past for her but not ready to commit yet.  Will need to check back with them on final decision. PT also recommending HH PT on discharge.  Will need order.  03-02-13 10:10am Luz Lex, RNBSN - [phone] 10 days post op, Afib/CP  over w/e - tx back to ICU.  Plan for dc home with wife and HH.  3:45pm Sitting up in chair, doing neb,sleepy.  States lives in [place], agreeable to [facility] visiting when at home.  Requested list of agencies to choose from.

## 2013-02-25 NOTE — Progress Notes (Signed)
Ambulated in hallway with patient using front wheel walker. Patient ambulated approx. 350 feet on Room air. SATS at 94%. Patient tolerated walk well. This completes patient first walk for today.  Robert Richard R

## 2013-02-25 NOTE — Progress Notes (Signed)
Physical Therapy Treatment Patient Details Name: Robert Richard MRN: [id number] DOB: 1930-07-08 Today's Date: 02/25/2013 Time: 7824-2353 PT Time Calculation (min): 23 min  PT Assessment / Plan / Recommendation  History of Present Illness Pt adm for CABG x3 and AVR. PMHx LLE thrombectomy; afib; recent decr exercise tolerance due to CAD, aortic stenosis, COPD   PT Comments   Pt agreeable to progress mobility today; tolerated session well.  Follow Up Recommendations  Home health PT;Supervision for mobility/OOB     Does the patient have the potential to tolerate intense rehabilitation     Barriers to Discharge        Equipment Recommendations  Rolling walker with 5" wheels;3in1 (PT)    Recommendations for Other Services OT consult  Frequency Min 3X/week   Progress towards PT Goals Progress towards PT goals: Progressing toward goals  Plan Current plan remains appropriate    Precautions / Restrictions Precautions Precautions: Sternal;Fall Restrictions Weight Bearing Restrictions: No   Pertinent Vitals/Pain Pt denied pain    Mobility  Ambulation/Gait Ambulation Distance (Feet): 300 Feet General Gait Details: mod VCs needed to maintain increased step length and keep RW close, cues for increased R foot clearance    Exercises     PT Diagnosis:    PT Problem List:   PT Treatment Interventions:     PT Goals (current goals can now be found in the care plan section) Acute Rehab PT Goals Patient Stated Goal: regain independence PT Goal Formulation: With patient Time For Goal Achievement: 03/03/13 Potential to Achieve Goals: Good  Visit Information  Last PT Received On: 02/25/13 Assistance Needed: +1 History of Present Illness: Pt adm for CABG x3 and AVR. PMHx LLE thrombectomy; afib; recent decr exercise tolerance due to CAD, aortic stenosis, COPD    Subjective Data  Subjective: ready to ambulate Patient Stated Goal: regain independence   Cognition   Cognition Arousal/Alertness: Awake/alert Behavior During Therapy: WFL for tasks assessed/performed Overall Cognitive Status: Within Functional Limits for tasks assessed    Balance     End of Session PT - End of Session Equipment Utilized During Treatment: Gait belt Activity Tolerance: Patient tolerated treatment well Patient left: in chair;with call bell/phone within reach;with family/visitor present Nurse Communication: Mobility status   GP     Faustino Congress K 02/25/2013, 12:04 PM  Laureen Abrahams, PT, DPT [phone]

## 2013-02-25 NOTE — Progress Notes (Signed)
Bladder scanned patient post voiding. Patient voided 90 ML. Bladder scanned showed 56 ML residual. Will repeat again today.  Elmarie Shiley R

## 2013-02-25 NOTE — Progress Notes (Signed)
CARDIAC REHAB PHASE I   PRE:  Rate/Rhythm: 92SR  BP:  Supine:   Sitting: 133/70  Standing:    SaO2: 93%RA  MODE:  Ambulation: 800 ft   POST:  Rate/Rhythm: 107ST  BP:  Supine:   Sitting: 149/63  Standing:    SaO2: 95%RA 1045-1135 Pt walked 800 ft on RA with rolling walker and gait belt use and asst x 1. Tolerated well. Second walk today. Pt very motivated to walk. Taking bigger steps today. Pt and wife denied need for walker for home at this time. To recliner after walk. No c/o SOB. Sats good on RA.   Graylon Good, RN BSN  02/25/2013 11:32 AM

## 2013-02-26 ENCOUNTER — Inpatient Hospital Stay (HOSPITAL_COMMUNITY): Payer: Medicare Other

## 2013-02-26 DIAGNOSIS — I517 Cardiomegaly: Secondary | ICD-10-CM

## 2013-02-26 LAB — CBC
HCT: 23.2 % — ABNORMAL LOW (ref 39.0–52.0)
Hemoglobin: 7.9 g/dL — ABNORMAL LOW (ref 13.0–17.0)
MCH: 29.5 pg (ref 26.0–34.0)
MCHC: 34.1 g/dL (ref 30.0–36.0)
MCV: 86.6 fL (ref 78.0–100.0)
Platelets: 143 10*3/uL — ABNORMAL LOW (ref 150–400)
RBC: 2.68 MIL/uL — ABNORMAL LOW (ref 4.22–5.81)
RDW: 14.6 % (ref 11.5–15.5)
WBC: 13.6 10*3/uL — ABNORMAL HIGH (ref 4.0–10.5)

## 2013-02-26 LAB — BASIC METABOLIC PANEL
BUN: 30 mg/dL — ABNORMAL HIGH (ref 6–23)
CO2: 20 mEq/L (ref 19–32)
Calcium: 8.7 mg/dL (ref 8.4–10.5)
Chloride: 103 mEq/L (ref 96–112)
Creatinine, Ser: 1.22 mg/dL (ref 0.50–1.35)
GFR calc Af Amer: 63 mL/min — ABNORMAL LOW (ref >90)
GFR calc non Af Amer: 54 mL/min — ABNORMAL LOW (ref >90)
Glucose, Bld: 144 mg/dL — ABNORMAL HIGH (ref 70–99)
Potassium: 4.3 mEq/L (ref 3.7–5.3)
Sodium: 139 mEq/L (ref 137–147)

## 2013-02-26 LAB — GLUCOSE, CAPILLARY
Glucose-Capillary: 111 mg/dL — ABNORMAL HIGH (ref 70–99)
Glucose-Capillary: 114 mg/dL — ABNORMAL HIGH (ref 70–99)

## 2013-02-26 LAB — POCT I-STAT 3, ART BLOOD GAS (G3+)
Acid-base deficit: 4 mmol/L — ABNORMAL HIGH (ref 0.0–2.0)
Bicarbonate: 19.4 mEq/L — ABNORMAL LOW (ref 20.0–24.0)
O2 Saturation: 84 %
TCO2: 20 mmol/L (ref 0–100)
pCO2 arterial: 27.9 mmHg — ABNORMAL LOW (ref 35.0–45.0)
pH, Arterial: 7.45 (ref 7.350–7.450)
pO2, Arterial: 45 mmHg — ABNORMAL LOW (ref 80.0–100.0)

## 2013-02-26 LAB — INFLUENZA PANEL BY PCR (TYPE A & B)
H1N1 flu by pcr: NOT DETECTED
Influenza A By PCR: POSITIVE — AB
Influenza B By PCR: NEGATIVE

## 2013-02-26 LAB — SEDIMENTATION RATE: Sed Rate: 128 mm/hr — ABNORMAL HIGH (ref 0–16)

## 2013-02-26 MED ORDER — SODIUM CHLORIDE 0.9 % IJ SOLN
10.0000 mL | Freq: Two times a day (BID) | INTRAMUSCULAR | Status: DC
  Administered 2013-02-26 – 2013-02-27 (×3): 10 mL
  Administered 2013-02-28: 20 mL
  Administered 2013-02-28 – 2013-03-03 (×7): 10 mL
  Administered 2013-03-04: 20 mL
  Administered 2013-03-05 – 2013-03-07 (×3): 10 mL

## 2013-02-26 MED ORDER — METHYLPREDNISOLONE SODIUM SUCC 125 MG IJ SOLR
80.0000 mg | Freq: Two times a day (BID) | INTRAMUSCULAR | Status: AC
  Administered 2013-02-26 – 2013-03-01 (×6): 80 mg via INTRAVENOUS
  Filled 2013-02-26 (×6): qty 1.28

## 2013-02-26 MED ORDER — DIPHENHYDRAMINE HCL 12.5 MG/5ML PO LIQD: 12.5000 mg | Freq: Every evening | ORAL | Status: DC | PRN

## 2013-02-26 MED ORDER — METOPROLOL TARTRATE 50 MG PO TABS
50.0000 mg | ORAL_TABLET | Freq: Two times a day (BID) | ORAL | Status: DC
  Administered 2013-02-26 (×2): 50 mg via ORAL
  Filled 2013-02-26 (×4): qty 1

## 2013-02-26 MED ORDER — ZOLPIDEM TARTRATE 5 MG PO TABS
5.0000 mg | ORAL_TABLET | Freq: Every evening | ORAL | Status: DC | PRN
  Administered 2013-02-27 – 2013-03-03 (×4): 5 mg via ORAL
  Filled 2013-02-26 (×4): qty 1

## 2013-02-26 MED ORDER — KCL IN DEXTROSE-NACL 20-5-0.9 MEQ/L-%-% IV SOLN
INTRAVENOUS | Status: DC
  Administered 2013-02-26 – 2013-02-28 (×3): via INTRAVENOUS
  Filled 2013-02-26 (×4): qty 1000

## 2013-02-26 MED ORDER — HYDRALAZINE HCL 10 MG PO TABS
10.0000 mg | ORAL_TABLET | Freq: Three times a day (TID) | ORAL | Status: DC
  Administered 2013-02-26 – 2013-02-27 (×2): 10 mg via ORAL
  Filled 2013-02-26 (×5): qty 1

## 2013-02-26 MED ORDER — SODIUM CHLORIDE 0.9 % IJ SOLN
10.0000 mL | INTRAMUSCULAR | Status: DC | PRN
  Administered 2013-03-04: 10 mL
  Administered 2013-03-05: 20 mL
  Administered 2013-03-06 – 2013-03-07 (×3): 10 mL
  Administered 2013-03-08 – 2013-03-09 (×3): 20 mL

## 2013-02-26 NOTE — Progress Notes (Signed)
Patient transferred to [place] per MD orders. Report given to RN. Elmarie Shiley R

## 2013-02-26 NOTE — Progress Notes (Signed)
Peripherally Inserted Central Catheter/Midline Placement  The IV Nurse has discussed with the patient and/or persons authorized to consent for the patient, the purpose of this procedure and the potential benefits and risks involved with this procedure.  The benefits include less needle sticks, lab draws from the catheter and patient may be discharged home with the catheter.  Risks include, but not limited to, infection, bleeding, blood clot (thrombus formation), and puncture of an artery; nerve damage and irregular heat beat.  Alternatives to this procedure were also discussed.  PICC/Midline Placement Documentation  PICC / Midline Double Lumen 02/26/13 PICC Left Brachial 50 cm 0 cm (Active)       Leya Paige, Nicolette Bang 02/26/2013, 9:48 PM

## 2013-02-26 NOTE — Discharge Summary (Signed)
Physician Discharge Summary  Patient ID: SANCHEZ HEMMER MRN: [id number] DOB/AGE: Jul 06, 1930 80 y.o.  Admit date: 02/20/2013 Discharge date: 02/26/2013  Admission Diagnoses:  Patient Active Problem List   Diagnosis Date Noted  . History of embolectomy 09/22/2012  . Renal insufficiency, mild 09/22/2012  . New onset atrial fibrillation 09/15/2012  . Syncope 09/15/2012  . Peripheral vascular disease, unspecified 07/08/2012  . Mitral stenosis 07/27/2010  . HYPERLIPIDEMIA 10/25/2009  . HYPERTENSION 10/25/2009  . NEPHROLITHIASIS, HX OF 10/25/2009  . SLEEP DISORDER 11/29/2008  . SNORING 10/14/2008  . FATIGUE 10/05/2008  . APNEA 10/05/2008  . SKIN CANCER, HX OF 10/05/2008  . GASTROENTERITIS 05/06/2008  . FASTING HYPERGLYCEMIA 05/06/2008  . RESTLESS LEG SYNDROME 09/11/2007  . Allergic Rhinitis, Cause Unspecified 09/11/2007  . GOUT 03/15/2006  . ANEMIA-NOS 03/15/2006  . ASTHMA 03/15/2006  . COPD 03/15/2006  . OSTEOPOROSIS 03/15/2006  . COLONIC POLYPS, HX OF 03/15/2006   Discharge Diagnoses:   Patient Active Problem List   Diagnosis Date Noted  . S/P CABG x 3 02/20/2013  . History of embolectomy 09/22/2012  . Renal insufficiency, mild 09/22/2012  . New onset atrial fibrillation 09/15/2012  . Syncope 09/15/2012  . Peripheral vascular disease, unspecified 07/08/2012  . Mitral stenosis 07/27/2010  . HYPERLIPIDEMIA 10/25/2009  . HYPERTENSION 10/25/2009  . NEPHROLITHIASIS, HX OF 10/25/2009  . SLEEP DISORDER 11/29/2008  . SNORING 10/14/2008  . FATIGUE 10/05/2008  . APNEA 10/05/2008  . SKIN CANCER, HX OF 10/05/2008  . GASTROENTERITIS 05/06/2008  . FASTING HYPERGLYCEMIA 05/06/2008  . RESTLESS LEG SYNDROME 09/11/2007  . Allergic Rhinitis, Cause Unspecified 09/11/2007  . GOUT 03/15/2006  . ANEMIA-NOS 03/15/2006  . ASTHMA 03/15/2006  . COPD 03/15/2006  . OSTEOPOROSIS 03/15/2006  . COLONIC POLYPS, HX OF 03/15/2006   Discharged Condition: good  History of Present Illness:    Mr. Wurzer is an 80 yo white male referred to [facility] for CAD and Aortic Stenosis.  The patient's medical history is significant for embolectomy of his LLE, Atrial Fibrillation on Xarelto, and COPD.  The patient noticed a decrease in his exercise tolerance.  He underwent a Myoview study which was abnormal.  Further work up with cardiac catheterization was performed and showed a preserved EF and multivessel CAD.  Echocardiogram was obtained and showed decreased excursion of the aortic valve leaflets.  The patient was initially evaluated by Dr. Prescott Gum on 01/07/2013.  At that time the patient's imaging studies were reviewed and it was felt he would benefit from TEE to better assess his Aortic Valve, PFTs and ABIs. TEE obtained and showed evidence of severe restriction of mobility to the Aortic Valve.  PFTs showed mild to moderate restrictive disease.  He presented for further follow up with Dr. Prescott Gum on 01/29/2013 at which time it was felt the patient would benefit from Aortic Valve Replacement and Coronary Bypass Grafting.  The risks and benefits of the procedure were explained to the patient and he was agreeable to proceed.  Hospital Course:   The patient presented to [facility] on 02/20/2013.  He was taken to the operating room and underwent CABG x 3 utilizing LIMA to LAD, SVG to OM1 and SVG to PDA, Aortic Valve Replacement utilizing a 23 mm [employer] Magna Ease Pericardial Tissue Valve, and Endoscopic Saphenous vein harvest of right leg.  The patient tolerated the procedure well and was taken to the SICU in stable condition.  The patient was extubated the evening of surgery.  During his stay in the SICU  he was weaned off Dopamine as tolerated.  He was transfused for expected post operative blood loss anemia.  The patient developed urinary incontinence, after self removal of foley with balloon inflated and intact.  He developed Atrial Fibrillation and was placed on Amiodarone with successful  conversion to NSR.  His chest tubes and arterial lines were removed.  He was ambulating around the unit and transferred to the step down unit in stable condition.  Follow up CXR was obtained morning after transfer, which revealed development of bilateral upper lobe infiltrates.  These were suspicious for pneumonia and the patient was started on IV ABX.  The patient developed hypertension and was placed on Hydralazine and his Lopressor was titrated as tolerated.  The patient developed low grade fever and increasing leukocytes despite 3 days of IV ABX.  Urinalysis was obtained and CT Scan of the chest was ordered to rule out Pulmonary embolism.  The patient was visibly tachypneic and it was felt he should be transferred to the ICU.  He later developed ARDS and tested positive for Influenza A.  He was treated with Tamiflu and steroids.  The patient developed post operative anemia and required transfusion of packed cells.  Patient developed some mental status changes.  CT scan of the head was obtained and ruled out acute process.  Once the patient's respiratory status improved he was transferred to the step down unit.  The patient has continued to progress.  He has developed some mouth and throat soreness for which magic mouthwash is providing relief.  He was started on Coumadin at a regimen of 1 mg daily.  His INR is currently therapeutic at 2.36.  His INR continues to trend upward with only 1 mg of Coumadin, therefore we will have him take this medication every other day.  He was taking Xarelto pre operatively and this will most likely be resumed in the future.  He has developed some hypotension and his Hydralazine was decreased to 10 mg TID.  He remains in rate controlled Atrial Fibrillation.  His pacing wires have been removed without difficulty.  The patient continues to complain of sore mouth and throat.  He was being treated with magic mouthwash which did provide some relief.  However, examination revealed  evidence of thrush.  The patient was give Diflucan and will be discharged home on Nystatin swish and swallow.  Should these symptoms persist he was instructed to follow up with his primary care physician.  The patient is deconditioned and would ultimately benefit from SNF placement.  However, the patient and his wife decline this and feel they will be fine at home.  Home health services have been arranged.  The patient is medically stable at this time.  He has completed his course of IV antibiotics.  He is ambulating with assistance and tolerating a cardiac diet.  Should no further issues arise we anticipate discharge home in the next 24-48 hours.  He will follow up with Dr. Prescott Gum in 3 weeks with a CXR prior to his appointment.  He will also need to follow up with his Cardiologist.        Significant Diagnostic Studies: angiography:   Hemodynamics:  AO 112/56  LV 133/2  Coronary angiography:  Coronary dominance: right  Left mainstem: The left mainstem is heavily calcified. The vessel has 30% proximal stenosis and 50% distal stenosis. The left main trifurcates into the LAD, intermediate branch, and left circumflex.  Left anterior descending (LAD): The LAD has severe proximal  vessel stenosis with 75% stenosis at the ostium and 75% stenosis in the mid vessel. There is heavy calcification throughout the proximal and mid LAD. The diagonal branches are patent. The distal/apical LAD has 75% stenosis.  Left circumflex (LCx): There is a moderate caliber intermediate branch that divides into twin vessels. This branch has 60-70% stenosis. The AV circumflex also has proximal disease with 50-60% stenosis. It divides into 2 obtuse marginal branches.  Right coronary artery (RCA): The RCA has a heavy calcification and severe ostial stenosis of 90%. There is segmental disease in the proximal RCA with diffuse 60-70% stenosis until the vessel becomes large in size in its midportion. There is mild nonobstructive disease  and the distal branch vessels are good targets with a PDA branch and 2 posterolateral branches.  TEE:  FINDINGS:   LEFT VENTRICLE: EF = 55-60%. No regional wall motion abnormalities. +LVH   RIGHT VENTRICLE: Normal size and function.  LEFT ATRIUM: Moderately dilated LA diameter 5.5 cm. + smoke  LEFT ATRIAL APPENDAGE: No thrombus.  RIGHT ATRIUM: Dilated  AORTIC VALVE: Trileaflet. Severely calcified. Moderately to severely decreased cusp excursion. Moderate AS AVA by planimetry 1.2 cm. Mean gradient 25 mm HG (may be underestimated due to lack of alignment). No AI  MITRAL VALVE: Moderately thickened. Severe annular calcification. Posterior leaflet is restricted. Mild MR. Mild MS mean gradient = 46mm HG. MVA by pressure half time 1.6 cm2. Only able to visualize 1 pulmonary vein - no flow reversal.  TRICUSPID VALVE: Normal. No TR.  PULMONIC VALVE: Grossly normal. Trivial PI  INTERATRIAL SEPTUM: No PFO or ASD.  PERICARDIUM: No effusion  DESCENDING AORTA: Seen briefly as scope had to be removed due to patient coughing. Moderate to severe diffuse plaque.   Treatments: surgery:   1. Coronary artery bypass grafting x3 (left internal mammary artery to  LAD, saphenous vein graft to OM1, saphenous vein graft to posterior  descending).  2. Aortic valve replacement with a 23 mm [facility] Ease  pericardial valve, serial number [id number].  3. Endoscopic harvest of right leg greater saphenous vein.   Disposition: 01-Home or Self Care  Discharge Medications:     Medication List    STOP taking these medications       nitroGLYCERIN 0.4 MG SL tablet  Commonly known as:  NITROSTAT     Rivaroxaban 20 MG Tabs tablet  Commonly known as:  XARELTO     sildenafil 100 MG tablet  Commonly known as:  VIAGRA      TAKE these medications       aspirin EC 81 MG tablet  Take 81 mg by mouth daily.     budesonide-formoterol 160-4.5 MCG/ACT inhaler  Commonly known as:  SYMBICORT  Inhale 2 puffs into  the lungs 2 (two) times daily.     busPIRone 15 MG tablet  Commonly known as:  BUSPAR  Take 7.5 mg by mouth 2 (two) times daily.     calcium citrate-vitamin D 315-200 MG-UNIT per tablet  Commonly known as:  CITRACAL+D  Take 1 tablet by mouth at bedtime.     cetirizine 10 MG tablet  Commonly known as:  ZYRTEC  Take 10 mg by mouth daily as needed for allergies.     diltiazem 240 MG 24 hr capsule  Commonly known as:  CARDIZEM CD  Take 1 capsule (240 mg total) by mouth daily.     diphenhydrAMINE 25 mg capsule  Commonly known as:  BENADRYL  Take 25 mg by mouth every  4 (four) hours as needed for allergies.     ferrous sulfate 325 (65 FE) MG tablet  Take 325 mg by mouth daily with breakfast.     fluconazole 50 MG tablet  Commonly known as:  DIFLUCAN  Take 1 tablet (50 mg total) by mouth daily. For 4 Days  Start taking on:  03/10/2013     fluticasone 50 MCG/ACT nasal spray  Commonly known as:  FLONASE  Place 1 spray into the nose 2 (two) times daily as needed for rhinitis.     furosemide 40 MG tablet  Commonly known as:  LASIX  Take 1 tablet (40 mg total) by mouth daily. For 5 Days     hydrALAZINE 10 MG tablet  Commonly known as:  APRESOLINE  Take 1 tablet (10 mg total) by mouth 3 (three) times daily.     HYDROcodone-acetaminophen 5-325 MG per tablet  Commonly known as:  NORCO/VICODIN  Take 1 tablet by mouth every 6 (six) hours as needed for moderate pain or severe pain.     ketotifen 0.025 % ophthalmic solution  Commonly known as:  ZADITOR  Place 1 drop into both eyes daily.     Lidocaine HCl 4 % Soln  Apply 4 mLs topically as needed (for migraines).     metoprolol tartrate 25 MG tablet  Commonly known as:  LOPRESSOR  Take 1 tablet (25 mg total) by mouth 2 (two) times daily.     MIRALAX powder  Generic drug:  polyethylene glycol powder  Take 17 g by mouth daily.     multivitamin tablet  Take 1 tablet by mouth daily.     nystatin 100000 UNIT/ML suspension   Commonly known as:  MYCOSTATIN  Take 5 mLs (500,000 Units total) by mouth 4 (four) times daily. For 7 Days     OVER THE COUNTER MEDICATION  Take 1 tablet by mouth at bedtime as needed (for leg cramps). Legatrin PM     POTASSIUM & MAGNESIUM ASPARTAT PO  Take 2 tablets by mouth daily.     potassium chloride SA 20 MEQ tablet  Commonly known as:  K-DUR,KLOR-CON  Take 1 tablet (20 mEq total) by mouth daily. For 5 Days     pravastatin 40 MG tablet  Commonly known as:  PRAVACHOL  TAKE ONE (1) TABLET BY MOUTH EVERY DAY     ranitidine 150 MG tablet  Commonly known as:  ZANTAC  TAKE ONE TABLET TWICE DAILY     SF 5000 PLUS 1.1 % Crea dental cream  Generic drug:  sodium fluoride  Place 1 application onto teeth at bedtime. As directed     topiramate 25 MG tablet  Commonly known as:  TOPAMAX  Take 75 mg by mouth daily.     traMADol 50 MG tablet  Commonly known as:  ULTRAM  Take 1-2 tablets (50-100 mg total) by mouth every 6 (six) hours as needed for moderate pain.     Vitamin D3 2000 UNITS Tabs  Take 1,000 Units by mouth daily.     warfarin 1 MG tablet  Commonly known as:  COUMADIN  Take 1 tablet (1 mg total) by mouth every Monday, Wednesday, Friday, and Saturday at 6 PM.         The patient has been discharged on:   1.Beta Blocker:  Yes [ x  ]  No   [   ]                              If No, reason:  2.Ace Inhibitor/ARB: Yes [   ]                                     No  [  x  ]                                     If No, reason: Labile blood pressure  3.Statin:   Yes [ x ]                  No  [   ]                  If No, reason:  4.Ecasa:  Yes  [ x  ]                  No   [   ]                  If No, reason:   Signed: [name] 02/26/2013, 8:25 AM

## 2013-02-26 NOTE — Progress Notes (Signed)
  Echocardiogram 2D Echocardiogram has been performed.  Robert Richard 02/26/2013, 11:19 AM

## 2013-02-26 NOTE — Progress Notes (Signed)
CT surgery pm rounds  Patient has developed  Bilateral upper lobe dense infiltrates with fever and cough Antibiotics started, swallow eval fairly normal ( mod Ba swallow) Amiodarone stopped- ESR 125, iv steroids started Blood and urine cultures sent  2D echo with excellent LV,RV fx and AVR is good O2 sats being maintained on 4 L NSR  Wife updated on patients condition.

## 2013-02-26 NOTE — Procedures (Signed)
Objective Swallowing Evaluation: Modified Barium Swallowing Study  Patient Details  Name: Robert Richard MRN: [id number] Date of Birth: 1930/11/08  Today's Date: 02/26/2013 Time: 7124-5809 SLP Time Calculation (min): 27 min  Past Medical History:  Past Medical History  Diagnosis Date  . Anemia   . Colon polyp   . COPD (chronic obstructive pulmonary disease)     Astmatic bronchitis  . Asthma with bronchitis   . Gout     PMH  . Osteoporosis   . Allergic rhinitis   . Skin cancer, basal cell     Dr Nevada Crane, PMH  . Nephrolithiasis   . HLD (hyperlipidemia)   . Skin cancer (melanoma) 2012    Right neck  . Arthritis     SHOULDER  . Headache(784.0)   . RLS (restless legs syndrome)   . Dysrhythmia 08/2012    NEW ONSET ATRIAL FIBRILATION   Past Surgical History:  Past Surgical History  Procedure Laterality Date  . Finger surgery      for foreign body  . Colonoscopy w/ polypectomy      Dr Deatra Ina  . Inguinal hernia repair    . Nose surgery      Septal Deviation  . Neck surgery  12/2010    Melanoma ; [facility]   . Leg surgery  06/2010     Dr.Lawson, for blood clott. Left leg   . Tee without cardioversion N/A 01/23/2013    Procedure: TRANSESOPHAGEAL ECHOCARDIOGRAM (TEE);  Surgeon: Jolaine Artist, MD;  Location: [facility] ENDOSCOPY;  Service: Cardiovascular;  Laterality: N/A;  sign heart cath consent w/tee consent  . Coronary artery bypass graft N/A 02/20/2013    Procedure: CORONARY ARTERY BYPASS GRAFTING (CABG);  Surgeon: Ivin Poot, MD;  Location: [place];  Service: Open Heart Surgery;  Laterality: N/A;  . Aortic valve replacement N/A 02/20/2013    Procedure: AORTIC VALVE REPLACEMENT (AVR);  Surgeon: Ivin Poot, MD;  Location: [place];  Service: Open Heart Surgery;  Laterality: N/A;  . Intraoperative transesophageal echocardiogram N/A 02/20/2013    Procedure: INTRAOPERATIVE TRANSESOPHAGEAL ECHOCARDIOGRAM;  Surgeon: Ivin Poot, MD;  Location: [place];  Service: Open Heart  Surgery;  Laterality: N/A;   HPI:  80 yr old underwent CABG x3 and aortic valve replacement.   Associated small left pleural effusion.  Now with Altered mental status confusion.  CXR New onset bilateral upper lobe and left lower lobe pulmonary infiltrates suggesting pneumonia.  Lung status worsening and suspicion of aspiration, therefore SLP recommended MBS to fully assess.     Assessment / Plan / Recommendation Clinical Impression  Dysphagia Diagnosis: Mild oral phase dysphagia;Mild pharyngeal phase dysphagia;Moderate pharyngeal phase dysphagia Clinical impression: Pt. exhibited mild oral dysphagia with delayed transit exacerbated by xerostomia and decreased endurance.  Mild-mod sensory pharyngeal dysphagia with reduced tongue base retraction and laryngeal elevation resulting in trace vallecular/pyriform sinus/pharyngeal wall residue.  Observed one episode of flash laryngeal penetration with thin with increased work of breathing throughout study.  SLP educated pt. on importance with clinical reasoning of swallow precautions (do not eat if increased SOB, frequent rest breaks, small sips, cup sips preferred).  Recommend continue regular texture, thin liquids following swallow precautions with continued ST for reiteration and safety with po's.      Treatment Recommendation  Therapy as outlined in treatment plan below    Diet Recommendation Regular;Thin liquid   Liquid Administration via: Cup;No straw Medication Administration: Whole meds with puree Supervision: Patient able to self feed;Intermittent supervision to cue  for compensatory strategies Compensations: Slow rate;Small sips/bites Postural Changes and/or Swallow Maneuvers: Seated upright 90 degrees;Upright 30-60 min after meal    Other  Recommendations Oral Care Recommendations: Oral care BID   Follow Up Recommendations  None    Frequency and Duration min 2x/week  2 weeks   Pertinent Vitals/Pain WDL         Reason for Referral  Objectively evaluate swallowing function   Oral Phase Oral Preparation/Oral Phase Oral Phase: Impaired Oral - Honey Oral - Honey Teaspoon: Delayed oral transit;Weak lingual manipulation Oral - Nectar Oral - Nectar Cup: Delayed oral transit;Weak lingual manipulation Oral - Solids Oral - Puree: Delayed oral transit;Weak lingual manipulation   Pharyngeal Phase Pharyngeal Phase Pharyngeal Phase: Impaired Pharyngeal - Honey Pharyngeal - Honey Teaspoon: Pharyngeal residue - valleculae;Reduced tongue base retraction Pharyngeal - Nectar Pharyngeal - Nectar Teaspoon: Delayed swallow initiation;Premature spillage to valleculae Pharyngeal - Nectar Cup: Delayed swallow initiation;Premature spillage to pyriform sinuses;Pharyngeal residue - valleculae;Reduced tongue base retraction;Premature spillage to valleculae Pharyngeal - Thin Pharyngeal - Thin Teaspoon: Delayed swallow initiation;Premature spillage to valleculae Pharyngeal - Thin Cup: Premature spillage to valleculae;Delayed swallow initiation;Premature spillage to pyriform sinuses;Pharyngeal residue - valleculae;Reduced tongue base retraction;Pharyngeal residue - pyriform sinuses;Penetration/Aspiration during swallow (trace residue) Penetration/Aspiration details (thin cup): Material enters airway, remains ABOVE vocal cords and not ejected out Pharyngeal - Thin Straw: Delayed swallow initiation;Premature spillage to pyriform sinuses;Pharyngeal residue - valleculae;Reduced tongue base retraction Pharyngeal - Solids Pharyngeal - Puree: Pharyngeal residue - valleculae;Reduced tongue base retraction;Pharyngeal residue - posterior pharnyx  Cervical Esophageal Phase    GO    Cervical Esophageal Phase Cervical Esophageal Phase: Darryll Capers         [place] Mon.Ed [employer] [phone]  02/26/2013

## 2013-02-26 NOTE — Progress Notes (Signed)
Chaplain received referral from pt's RN to offer support to pt's wife. RN said that she had not eaten all day and seemed very worried, but that pt was doing well. Chaplain offered support to pt's wife, who continually said, "I hope he gets better." Pt and wife have a son and grandchildren who live locally. Chaplain inquired is pt's wife had eaten today, and she said no. Chaplain encouraged her to take care of herself. Provided emotional and spiritual support, empathic listening, and ministry of presence. She was grateful for chaplain's visit and support.   [place], [place] [phone] - On-Call

## 2013-02-26 NOTE — Progress Notes (Signed)
Patient ID: Robert Richard, male   DOB: 07-02-1930, 80 y.o.   MRN: [id number] EVENING ROUNDS NOTE :     [address]       [place] [address]             [phone]                 6 Days Post-Op Procedure(s) (LRB): CORONARY ARTERY BYPASS GRAFTING (CABG) (N/A) AORTIC VALVE REPLACEMENT (AVR) (N/A) INTRAOPERATIVE TRANSESOPHAGEAL ECHOCARDIOGRAM (N/A)  Total Length of Stay:  LOS: 6 days  BP 144/66  Pulse 83  Temp(Src) 100.6 F (38.1 C) (Axillary)  Resp 24  Ht 6\' 1"  (1.854 m)  Wt 147 lb 12.8 oz (67.042 kg)  BMI 19.50 kg/m2  SpO2 94%  .Intake/Output     01/08 0701 - 01/09 0700   P.O.    I.V. (mL/kg) 50 (0.7)   IV Piggyback 50   Total Intake(mL/kg) 100 (1.5)   Urine (mL/kg/hr) 100 (0.1)   Total Output 100   Net 0       Urine Occurrence 1 x     . dextrose 5 % and 0.9 % NaCl with KCl 20 mEq/L 50 mL/hr at 02/26/13 1700     Lab Results  Component Value Date   WBC 13.6* 02/26/2013   HGB 7.9* 02/26/2013   HCT 23.2* 02/26/2013   PLT 143* 02/26/2013   GLUCOSE 144* 02/26/2013   CHOL 130 01/11/2012   TRIG 67.0 01/11/2012   HDL 44.60 01/11/2012   LDLCALC 72 01/11/2012   ALT 27 02/23/2013   AST 68* 02/23/2013   NA 139 02/26/2013   K 4.3 02/26/2013   CL 103 02/26/2013   CREATININE 1.22 02/26/2013   BUN 30* 02/26/2013   CO2 20 02/26/2013   TSH 2.547 09/15/2012   INR 1.51* 02/20/2013   HGBA1C 6.3* 02/10/2013   Back in icu for resp trouble Appears stable now   Grace Isaac MD  [place] [phone] Office [phone] 02/26/2013 7:23 PM

## 2013-02-26 NOTE — Progress Notes (Signed)
Speech Language Pathology  Patient Details Name: ROMIN DIVITA MRN: [id number] DOB: [birth year] Today's Date: 02/26/2013 Time: 1143-    MBS completed.  Full report to be documented.  No aspiration, flash penetration x 1. Recommend: continue regular texture diet and thin liquids, no straws, small sips, pills whole in applesauce, rest breaks, defer meal if very SOB  Orbie Pyo [place] M.Ed [employer] [phone]  02/26/2013

## 2013-02-26 NOTE — Progress Notes (Addendum)
      [address]       [place] [address]             [phone]      6 Days Post-Op Procedure(s) (LRB): CORONARY ARTERY BYPASS GRAFTING (CABG) (N/A) AORTIC VALVE REPLACEMENT (AVR) (N/A) INTRAOPERATIVE TRANSESOPHAGEAL ECHOCARDIOGRAM (N/A)  Subjective:  Robert Richard has no new complaints this morning.  He does have a low grade fever, without obvious source of infection.  + Ambulation + BM  Objective: Vital signs in last 24 hours: Temp:  [97.8 F (36.6 C)-99.2 F (37.3 C)] 99.1 F (37.3 C) (01/08 0318) Pulse Rate:  [74-93] 89 (01/08 0318) Cardiac Rhythm:  [-] Normal sinus rhythm (01/07 2039) Resp:  [18-20] 18 (01/08 0318) BP: (116-162)/(57-77) 162/77 mmHg (01/08 0318) SpO2:  [92 %-100 %] 92 % (01/08 0318) Weight:  [147 lb 12.8 oz (67.042 kg)] 147 lb 12.8 oz (67.042 kg) (01/08 0318)  Intake/Output from previous day: 01/07 0701 - 01/08 0700 In: 480 [P.O.:480] Out: 700 [Urine:700]  General appearance: alert, cooperative and no distress Heart: regular rate and rhythm Lungs: clear to auscultation bilaterally Abdomen: soft, non-tender; bowel sounds normal; no masses,  no organomegaly Extremities: edema none appreciated Wound: clean and dry  Lab Results:  Recent Labs  02/25/13 0408 02/26/13 0428  WBC 12.0* 13.6*  HGB 7.7* 7.9*  HCT 22.7* 23.2*  PLT 119* 143*   BMET:  Recent Labs  02/24/13 0328 02/25/13 0408  NA 136* 139  K 3.8 4.1  CL 100 105  CO2 22 23  GLUCOSE 142* 125*  BUN 30* 28*  CREATININE 1.28 1.26  CALCIUM 8.0* 8.4    PT/INR: No results found for this basename: LABPROT, INR,  in the last 72 hours ABG    Component Value Date/Time   PHART 7.388 02/21/2013 0423   HCO3 20.7 02/21/2013 0423   TCO2 18 02/21/2013 1608   ACIDBASEDEF 4.0* 02/21/2013 0423   O2SAT 99.0 02/21/2013 0423   CBG (last 3)   Recent Labs  02/25/13 1133 02/25/13 1618 02/25/13 2140  GLUCAP 106* 119* 119*    Assessment/Plan: S/P Procedure(s) (LRB): CORONARY  ARTERY BYPASS GRAFTING (CABG) (N/A) AORTIC VALVE REPLACEMENT (AVR) (N/A) INTRAOPERATIVE TRANSESOPHAGEAL ECHOCARDIOGRAM (N/A)  1. CV- NSR, remains hypertensive- HR in the 90s this morning, SBP 160s-on Lopressor, Hydralazine- will increase Lopressor to 50 mg BID 2. Pulm- patient denies dyspnea, off oxygen, does appear winded during exam, however per patient and wife he always breaths hard when he is laying down- will monitor 3. Renal- weight is below baseline, no LE edema will stop Lasix 4. ID- + infiltrates on CXR, suspicious for pneumonia- continue IV Tressie Ellis, will transition to Levaquin for discharge 5. Expected Blood Loss Anemia- Hgb is stable, 7.9 this morning 6. Febrile- low grade, WBC count up from yesterday, will order U/A, monitor 7. Dispo- continue current care, repeat CXR, BMET, CBC in AM, monitor fever, possibly home in AM   LOS: 6 days    [name], [name] 02/26/2013

## 2013-02-26 NOTE — Progress Notes (Signed)
PICC insertion attempted in right upper arm (basilic vein with vein occupancy 11%) resistance met with inserting PICC with 15 cm out, blood return obtained and flushed with ease.  Attempted to gently advance without success, sherlock device showing conflicting data, PICC retracted to 25 cm out and attempted to readvance without success.  PICC internal guidewire removed and sherlock device calibrated.  Upon attempting to reinsert internal guidewire into PICC, resistance met as well as no blood return obtained and unable to flush line.  Introducer still in so attempted to completely remove PICC and assess it, unable to remove catheter once it reach 36 cm.  (Introducer removed during the retraction assessment.)  PICC remains in patient's right upper arm with gauze and tegaderm dressing applied.  Patient then prepped and PICC placed successfully on left side without incident.  Reattempted to remove right upper arm catheter without success.  Again gauze and tegaderm dressing applied.  Naida Sleight, RN bedside nurse updated on occurences and will notify MD for further orders.  Patient's wife and son updated on above, questions answered and at bedside.  Kennadie Brenner, Nicolette Bang, RN VAST

## 2013-02-27 ENCOUNTER — Inpatient Hospital Stay (HOSPITAL_COMMUNITY): Payer: Medicare Other

## 2013-02-27 DIAGNOSIS — E43 Unspecified severe protein-calorie malnutrition: Secondary | ICD-10-CM | POA: Insufficient documentation

## 2013-02-27 LAB — RESPIRATORY VIRUS PANEL
Adenovirus: NOT DETECTED
Influenza A H1: NOT DETECTED
Influenza A H3: DETECTED — AB
Influenza A: DETECTED — AB
Influenza B: NOT DETECTED
Metapneumovirus: DETECTED — AB
Parainfluenza 1: NOT DETECTED
Parainfluenza 2: NOT DETECTED
Parainfluenza 3: NOT DETECTED
Respiratory Syncytial Virus A: NOT DETECTED
Respiratory Syncytial Virus B: NOT DETECTED
Rhinovirus: NOT DETECTED

## 2013-02-27 LAB — CBC
HCT: 22.5 % — ABNORMAL LOW (ref 39.0–52.0)
Hemoglobin: 7.9 g/dL — ABNORMAL LOW (ref 13.0–17.0)
MCH: 30.4 pg (ref 26.0–34.0)
MCHC: 35.1 g/dL (ref 30.0–36.0)
MCV: 86.5 fL (ref 78.0–100.0)
Platelets: 177 10*3/uL (ref 150–400)
RBC: 2.6 MIL/uL — ABNORMAL LOW (ref 4.22–5.81)
RDW: 14.6 % (ref 11.5–15.5)
WBC: 8.9 10*3/uL (ref 4.0–10.5)

## 2013-02-27 LAB — URINE MICROSCOPIC-ADD ON

## 2013-02-27 LAB — COMPREHENSIVE METABOLIC PANEL
ALT: 41 U/L (ref 0–53)
AST: 38 U/L — ABNORMAL HIGH (ref 0–37)
Albumin: 2.3 g/dL — ABNORMAL LOW (ref 3.5–5.2)
Alkaline Phosphatase: 151 U/L — ABNORMAL HIGH (ref 39–117)
BUN: 31 mg/dL — ABNORMAL HIGH (ref 6–23)
CO2: 20 mEq/L (ref 19–32)
Calcium: 8.3 mg/dL — ABNORMAL LOW (ref 8.4–10.5)
Chloride: 108 mEq/L (ref 96–112)
Creatinine, Ser: 1.04 mg/dL (ref 0.50–1.35)
GFR calc Af Amer: 76 mL/min — ABNORMAL LOW (ref >90)
GFR calc non Af Amer: 66 mL/min — ABNORMAL LOW (ref >90)
Glucose, Bld: 167 mg/dL — ABNORMAL HIGH (ref 70–99)
Potassium: 4.5 mEq/L (ref 3.7–5.3)
Sodium: 142 mEq/L (ref 137–147)
Total Bilirubin: 0.7 mg/dL (ref 0.3–1.2)
Total Protein: 6.3 g/dL (ref 6.0–8.3)

## 2013-02-27 LAB — URINALYSIS, ROUTINE W REFLEX MICROSCOPIC
Bilirubin Urine: NEGATIVE
Glucose, UA: NEGATIVE mg/dL
Hgb urine dipstick: NEGATIVE
Ketones, ur: 15 mg/dL — AB
Leukocytes, UA: NEGATIVE
Nitrite: NEGATIVE
Protein, ur: 30 mg/dL — AB
Specific Gravity, Urine: 1.023 (ref 1.005–1.030)
Urobilinogen, UA: 1 mg/dL (ref 0.0–1.0)
pH: 7 (ref 5.0–8.0)

## 2013-02-27 MED ORDER — DILTIAZEM HCL ER COATED BEADS 240 MG PO CP24
240.0000 mg | ORAL_CAPSULE | Freq: Every day | ORAL | Status: DC
  Administered 2013-02-27 – 2013-03-09 (×11): 240 mg via ORAL
  Filled 2013-02-27 (×11): qty 1

## 2013-02-27 MED ORDER — ENOXAPARIN SODIUM 30 MG/0.3ML ~~LOC~~ SOLN
30.0000 mg | SUBCUTANEOUS | Status: DC
  Administered 2013-02-27 – 2013-03-02 (×4): 30 mg via SUBCUTANEOUS
  Filled 2013-02-27 (×5): qty 0.3

## 2013-02-27 MED ORDER — PRAVASTATIN SODIUM 40 MG PO TABS
40.0000 mg | ORAL_TABLET | Freq: Every day | ORAL | Status: DC
  Administered 2013-02-27 – 2013-03-08 (×10): 40 mg via ORAL
  Filled 2013-02-27 (×12): qty 1

## 2013-02-27 MED ORDER — ENSURE PUDDING PO PUDG
1.0000 | Freq: Three times a day (TID) | ORAL | Status: DC
  Administered 2013-02-27 – 2013-03-08 (×26): 1 via ORAL

## 2013-02-27 MED ORDER — HYDRALAZINE HCL 25 MG PO TABS
25.0000 mg | ORAL_TABLET | Freq: Three times a day (TID) | ORAL | Status: DC
  Administered 2013-02-27 – 2013-03-08 (×25): 25 mg via ORAL
  Filled 2013-02-27 (×30): qty 1

## 2013-02-27 MED ORDER — OSELTAMIVIR PHOSPHATE 30 MG PO CAPS
30.0000 mg | ORAL_CAPSULE | Freq: Two times a day (BID) | ORAL | Status: DC
  Administered 2013-02-27 – 2013-03-03 (×10): 30 mg via ORAL
  Filled 2013-02-27 (×13): qty 1

## 2013-02-27 MED ORDER — OSELTAMIVIR PHOSPHATE 30 MG PO CAPS
30.0000 mg | ORAL_CAPSULE | Freq: Every day | ORAL | Status: DC
  Filled 2013-02-27: qty 1

## 2013-02-27 MED ORDER — DILTIAZEM HCL ER COATED BEADS 120 MG PO TB24
240.0000 mg | ORAL_TABLET | Freq: Every day | ORAL | Status: DC
  Filled 2013-02-27: qty 2

## 2013-02-27 MED ORDER — POLYETHYLENE GLYCOL 3350 17 G PO PACK
17.0000 g | PACK | Freq: Every day | ORAL | Status: DC
  Administered 2013-03-01 – 2013-03-08 (×7): 17 g via ORAL
  Filled 2013-02-27 (×10): qty 1

## 2013-02-27 NOTE — Progress Notes (Signed)
Physical Therapy Treatment Patient Details Name: Robert Richard MRN: [id number] DOB: [birth year] Today's Date: 02/27/2013 Time: 8338-2505 PT Time Calculation (min): 41 min  PT Assessment / Plan / Recommendation  History of Present Illness Pt adm for CABG x3 and AVR.Pt developed flu and transferred back to ICU for respiratory distress. PMHx LLE thrombectomy; afib; recent decr exercise tolerance due to CAD, aortic stenosis, COPD   PT Comments   Despite transfer to ICU with flu, pt progressing well with his mobility and balance. Ambulated short distances in his room without a device with good balance.    Follow Up Recommendations  Home health PT;Supervision for mobility/OOB     Does the patient have the potential to tolerate intense rehabilitation     Barriers to Discharge        Equipment Recommendations  Rolling walker with 5" wheels;3in1 (PT)    Recommendations for Other Services OT consult  Frequency Min 3X/week   Progress towards PT Goals Progress towards PT goals: Progressing toward goals  Plan Current plan remains appropriate    Precautions / Restrictions Precautions Precautions: Sternal;Fall   Pertinent Vitals/Pain SaO2 93-96% on RA as walked to the bathroom, and while in bathroom;     Mobility  Transfers Overall transfer level: Needs assistance Equipment used: Pushed w/c Transfers: Sit to/from Stand Sit to Stand: Mod assist General transfer comment: pt requires assist  to shift ant over BOS (as he begins to lift off, he pushes himslef possterior) Ambulation/Gait Ambulation/Gait assistance: Min assist Ambulation Distance (Feet): 350 Feet Assistive device: Rolling walker (2 wheeled) Gait Pattern/deviations: Step-through pattern;Decreased stride length;Decreased dorsiflexion - right;Shuffle;Trunk flexed General Gait Details: mod cues for incr step length and focus on heelstrike to improve Rt dorsiflexion; pt maintains for ~15-20 feet and then requires vc    Exercises      PT Diagnosis:    PT Problem List:   PT Treatment Interventions:     PT Goals (current goals can now be found in the care plan section)    Visit Information  Last PT Received On: 02/27/13 Assistance Needed: +1 History of Present Illness: Pt adm for CABG x3 and AVR.Pt developed flu and transferred back to ICU for respiratory distress. PMHx LLE thrombectomy; afib; recent decr exercise tolerance due to CAD, aortic stenosis, COPD    Subjective Data      Cognition  Cognition Arousal/Alertness: Awake/alert Behavior During Therapy: WFL for tasks assessed/performed Overall Cognitive Status: Within Functional Limits for tasks assessed    Balance  Balance Overall balance assessment: Needs assistance Sitting-balance support: No upper extremity supported;Feet supported Sitting balance-Leahy Scale: Good Standing balance support: No upper extremity supported Standing balance-Leahy Scale: Fair Standing balance comment: no UE support this date High level balance activites: Backward walking;Direction changes High Level Balance Comments: pt assisted on/off toilet (vc to not use UEs); pt able to maintain 1/2 standing/squat for pericare with no UE support x 90 seconds; reaching for soap and towels outside BOS without UE support and steady; pt did not use an assistive device PTA  End of Session PT - End of Session Equipment Utilized During Treatment: Gait belt;Oxygen Activity Tolerance: Patient tolerated treatment well Patient left: in chair;with call bell/phone within reach Nurse Communication: Mobility status   GP     Joliene Salvador 02/27/2013, 3:52 PM Pager [phone]

## 2013-02-27 NOTE — Progress Notes (Signed)
INITIAL NUTRITION ASSESSMENT  DOCUMENTATION CODES Per approved criteria  -Severe malnutrition in the context of acute illness or injury   INTERVENTION:  Continue Ensure Pudding po TID, each supplement provides 170 kcal and 4 grams of protein RD to follow for nutrition care plan  NUTRITION DIAGNOSIS: Inadequate oral intake related to decreased appetite as evidenced by patient report  Goal: Pt to meet >/= 90% of their estimated nutrition needs   Monitor:  PO & supplemental intake, weight, labs, I/O's  Reason for Assessment: Consult  80 y.o. male  Admitting Dx: severe three-vessel CAD  ASSESSMENT: Patient with PMH of COPD, anemia, colon polyp; presented for AVR-CABG 1/2.  Patient transferred to 2W-Cardiac 1/5 post-op; CXR with new onset bilateral upper lobe pulmonary infiltrates suggesting PNA; transferred back to 2S-Surgical.  Patient reports his appetite has been decreased since being hospitalized; he states he was eating well PTA and his wife is "a good cook;" PO intake variable at 25-75% per flowsheet records; patient with visible muscle loss to upper body; amenable to continuing with Ensure Pudding supplements.  Patient meets criteria for severe malnutrition in the context of acute illlness as evidenced by < 50% of estimated energy requirement for > 5 days and moderate muscle loss (clavicles, deltoids, pectoralis).  Height: Ht Readings from Last 1 Encounters:  02/20/13  (1.854 m)    Weight: Wt Readings from Last 1 Encounters:  02/27/13 152 lb 1.9 oz (69 kg)    Ideal Body Weight: 184 lb  % Ideal Body Weight: 83%  Wt Readings from Last 10 Encounters:  02/27/13 152 lb 1.9 oz (69 kg)  02/27/13 152 lb 1.9 oz (69 kg)  02/15/13 152 lb 8.9 oz (69.2 kg)  02/10/13 152 lb 11.2 oz (69.264 kg)  01/28/13 154 lb (69.854 kg)  02/15/13 152 lb 8.9 oz (69.2 kg)  01/07/13 153 lb (69.4 kg)  01/05/13 155 lb (70.308 kg)  01/05/13 155 lb (70.308 kg)  01/02/13 155 lb (70.308  kg)    Usual Body Weight: 155 lb  % Usual Body Weight: 98%  BMI:  Body mass index is 20.07 kg/(m^2).  Estimated Nutritional Needs: Kcal: 1800-2000 Protein: 85-95 gm Fluid: 1.8-2.0 L  Skin: chest surgical incision  Diet Order: Cardiac  EDUCATION NEEDS: -No education needs identified at this time   Intake/Output Summary (Last 24 hours) at 02/27/13 1054 Last data filed at 02/27/13 1000  Gross per 24 hour  Intake   1060 ml  Output    150 ml  Net    910 ml    Labs:   Recent Labs Lab 02/20/13 2000  02/21/13 0430 02/21/13 1600  02/25/13 0408 02/26/13 0940 02/27/13 0445  NA  --   < > 141 135*  < > 139 139 142  K  --   < > 4.1 4.2  < > 4.1 4.3 4.5  CL  --   < > 108 102  < > 105 103 108  CO2  --   < > 21 19  < > BUN  --   < > 23 27*  < > 28* 30* 31*  CREATININE 1.07  < > 1.17 1.24  < > 1.26 1.22 1.04  CALCIUM  --   < > 7.6* 8.1*  < > 8.4 8.7 8.3*  MG 3.0*  --  2.4 2.5  --   --   --   --   GLUCOSE  --   < > 79 175*  < >  125* 144* 167*  < > = values in this interval not displayed.  CBG (last 3)   Recent Labs  02/25/13 2140 02/26/13 0617 02/26/13 1241  GLUCAP 119* 111* 114*    Scheduled Meds: . aspirin EC  81 mg Oral Daily  . bisacodyl  10 mg Oral Daily   Or  . bisacodyl  10 mg Rectal Daily  . budesonide-formoterol  2 puff Inhalation BID  . busPIRone  7.5 mg Oral BID  . calcium citrate  200 mg of elemental calcium Oral Daily  . cefTAZidime (FORTAZ)  IV  1 g Intravenous Q8H  . cholecalciferol  1,000 Units Oral Daily  . dextromethorphan-guaiFENesin  1 tablet Oral BID  . diltiazem  240 mg Oral Daily  . docusate sodium  200 mg Oral Daily  . enoxaparin (LOVENOX) injection  30 mg Subcutaneous Q24H  . feeding supplement (ENSURE)  1 Container Oral TID WC  . ferrous [id number] C-folic acid  1 capsule Oral TID PC  . hydrALAZINE  25 mg Oral Q8H  . ketotifen  1 drop Both Eyes Daily  . levalbuterol  0.63 mg Nebulization TID  . loratadine  10  mg Oral Daily  . methylPREDNISolone (SOLU-MEDROL) injection  80 mg Intravenous Q12H  . oseltamivir  30 mg Oral BID  . pantoprazole  40 mg Oral Daily  . [START ON 02/28/2013] polyethylene glycol  17 g Oral Daily  . simvastatin  20 mg Oral q1800  . sodium chloride  10-40 mL Intracatheter Q12H  . thiamine  100 mg Oral Daily  . topiramate  75 mg Oral Daily    Continuous Infusions: . dextrose 5 % and 0.9 % NaCl with KCl 20 mEq/L 50 mL/hr at 02/27/13 1000    Past Medical History  Diagnosis Date  . Anemia   . Colon polyp   . COPD (chronic obstructive pulmonary disease)     Astmatic bronchitis  . Asthma with bronchitis   . Gout     PMH  . Osteoporosis   . Allergic rhinitis   . Skin cancer, basal cell     Dr Nevada Crane, PMH  . Nephrolithiasis   . HLD (hyperlipidemia)   . Skin cancer (melanoma) 2012    Right neck  . Arthritis     SHOULDER  . Headache(784.0)   . RLS (restless legs syndrome)   . Dysrhythmia 08/2012    NEW ONSET ATRIAL FIBRILATION    Past Surgical History  Procedure Laterality Date  . Finger surgery      for foreign body  . Colonoscopy w/ polypectomy      Dr Deatra Ina  . Inguinal hernia repair    . Nose surgery      Septal Deviation  . Neck surgery  12/2010    Melanoma ; [facility]   . Leg surgery  06/2010     Dr.Lawson, for blood clott. Left leg   . Tee without cardioversion N/A 01/23/2013    Procedure: TRANSESOPHAGEAL ECHOCARDIOGRAM (TEE);  Surgeon: Jolaine Artist, MD;  Location[facility] ENDOSCOPY;  Service: Cardiovascular;  Laterality: N/A;  sign heart cath consent w/tee consent  . Coronary artery bypass graft N/A 02/20/2013    Procedure: CORONARY ARTERY BYPASS GRAFTING (CABG);  Surgeon: Ivin Poot, MD;  Location: [facility];  Service: Open Heart Surgery;  Laterality: N/A;  . Aortic valve replacement N/A 02/20/2013    Procedure: AORTIC VALVE REPLACEMENT (AVR);  Surgeon: Ivin Poot, MD;  Location: [facility];  Service: Open Heart Surgery;  Laterality: N/A;  .  Intraoperative transesophageal echocardiogram N/A 02/20/2013    Procedure: INTRAOPERATIVE TRANSESOPHAGEAL ECHOCARDIOGRAM;  Surgeon: Ivin Poot, MD;  Location: [facility];  Service: Open Heart Surgery;  Laterality: N/A;    Arthur Holms, RD, LDN Pager #: [phone] After-Hours Pager #: [phone]

## 2013-02-27 NOTE — Progress Notes (Signed)
Afebrile  BP 122/64  Pulse 78  Temp(Src) 97.8 F (36.6 C) (Oral)  Resp 21  Ht 6\' 1"  (1.854 m)  Wt 152 lb 1.9 oz (69 kg)  BMI 20.07 kg/m2  SpO2 96%   Intake/Output Summary (Last 24 hours) at 02/27/13 1719 Last data filed at 02/27/13 1600  Gross per 24 hour  Intake   1670 ml  Output    250 ml  Net   1420 ml    Frequent BM  Continue present care

## 2013-02-27 NOTE — Progress Notes (Signed)
Speech Language Pathology Treatment: Dysphagia  Patient Details Name: Robert Richard MRN: [id number] DOB: January 03, 1931 Today's Date: 02/27/2013 Time: 5146-0479 SLP Time Calculation (min): 8 min  Assessment / Plan / Recommendation Clinical Impression  F/u after yesterday's MBS.  Pt is swallowing functionally despite some generalized weakness.  Diet has been advanced to regular/thin liquids and aspiration has been ruled-out as a contributing etiology to lung status.  Provided with mechanical solids and liquids via straw today - pt tolerated well without concerning clinical symptoms; RR was well within range of adequate coordination with swallow response.  RN has noted no difficulties.  No further SLP f/u warranted at this time - will sign-off.   HPI HPI: 80 yr old underwent CABG x3 and aortic valve replacement.   Associated small left pleural effusion.  Now with Altered mental status confusion.  CXR New onset bilateral upper lobe and left lower lobe pulmonary infiltrates suggesting pneumonia.  Lung status worsening and suspicion of aspiration, therefore SLP recommended MBS to fully assess.      SLP Plan  All goals met    Recommendations Diet recommendations: Regular;Thin liquid Liquids provided via: Straw Medication Administration: Whole meds with puree Supervision: Patient able to self feed Compensations: Slow rate;Small sips/bites Postural Changes and/or Swallow Maneuvers: Seated upright 90 degrees;Upright 30-60 min after meal              Oral Care Recommendations: Oral care BID Follow up Recommendations: None Plan: All goals met    Robert Richard, [place] CCC/SLP Pager [phone]      Robert Richard 02/27/2013, 10:43 AM

## 2013-02-27 NOTE — Progress Notes (Signed)
7 Days Post-Op Procedure(s) (LRB): CORONARY ARTERY BYPASS GRAFTING (CABG) (N/A) AORTIC VALVE REPLACEMENT (AVR) (N/A) INTRAOPERATIVE TRANSESOPHAGEAL ECHOCARDIOGRAM (N/A) Subjective: Postop AVR CABG with resp illnes, bilat infiltrates and positive nasal swab for Influenza A Wheezing with wet cough bilat upper lobe infiltrates on CXR sl worse today  Objective: Vital signs in last 24 hours: Temp:  [97.7 F (36.5 C)-100.6 F (38.1 C)] 97.7 F (36.5 C) (01/09 0400) Pulse Rate:  [71-96] 78 (01/09 0700) Cardiac Rhythm:  [-] Normal sinus rhythm (01/09 0730) Resp:  [17-28] 26 (01/09 0700) BP: (131-172)/(46-86) 172/86 mmHg (01/09 0700) SpO2:  [90 %-99 %] 90 % (01/09 0700) Weight:  [152 lb 1.9 oz (69 kg)] 152 lb 1.9 oz (69 kg) (01/09 0500)  Hemondynamic parameters for last 24 hours:  nsr  Intake/Output from previous day: 01/08 0701 - 01/09 0700 In: 850 [I.V.:700; IV Piggyback:150] Out: 250 [Urine:250] Intake/Output this shift:    EXAM OOB to chair Lungs coarse wheezing hypertensive Lab Results:  Recent Labs  02/26/13 0428 02/27/13 0445  WBC 13.6* 8.9  HGB 7.9* 7.9*  HCT 23.2* 22.5*  PLT 143* 177   BMET:  Recent Labs  02/26/13 0940 02/27/13 0445  NA 139 142  K 4.3 4.5  CL 103 108  CO2 20 20  GLUCOSE 144* 167*  BUN 30* 31*  CREATININE 1.22 1.04  CALCIUM 8.7 8.3*    PT/INR: No results found for this basename: LABPROT, INR,  in the last 72 hours ABG    Component Value Date/Time   PHART 7.450 02/26/2013 1620   HCO3 19.4* 02/26/2013 1620   TCO2 20 02/26/2013 1620   ACIDBASEDEF 4.0* 02/26/2013 1620   O2SAT 84.0 02/26/2013 1620   CBG (last 3)   Recent Labs  02/25/13 2140 02/26/13 0617 02/26/13 1241  GLUCAP 119* 111* 114*    Assessment/Plan: S/P Procedure(s) (LRB): CORONARY ARTERY BYPASS GRAFTING (CABG) (N/A) AORTIC VALVE REPLACEMENT (AVR) (N/A) INTRAOPERATIVE TRANSESOPHAGEAL ECHOCARDIOGRAM (N/A) Anemia - chronic Type A flu with ARDS on CXR- Tamiflu +  steroids Wheezing/COPD--chg lopressor to cardizem ( was on preop) Keep in ICU  LOS: 7 days    [name],Joannie Medine 02/27/2013

## 2013-02-27 NOTE — Progress Notes (Signed)
Upon assessment @ 1930 pt found to be in A-fib, rate controlled 70s-90s. BP 129/74 (88).  Pt stats he "feels fine". MD Roxan Hockey called and made aware. As long as rate controlled, no new interventions at this time, will continue to monitor.  Rolla Plate

## 2013-02-28 ENCOUNTER — Inpatient Hospital Stay (HOSPITAL_COMMUNITY): Payer: Medicare Other

## 2013-02-28 LAB — CBC
HCT: 21.5 % — ABNORMAL LOW (ref 39.0–52.0)
Hemoglobin: 7.3 g/dL — ABNORMAL LOW (ref 13.0–17.0)
MCH: 29.7 pg (ref 26.0–34.0)
MCHC: 34 g/dL (ref 30.0–36.0)
MCV: 87.4 fL (ref 78.0–100.0)
Platelets: 196 10*3/uL (ref 150–400)
RBC: 2.46 MIL/uL — ABNORMAL LOW (ref 4.22–5.81)
RDW: 14.8 % (ref 11.5–15.5)
WBC: 12.8 10*3/uL — ABNORMAL HIGH (ref 4.0–10.5)

## 2013-02-28 LAB — COMPREHENSIVE METABOLIC PANEL
ALT: 48 U/L (ref 0–53)
AST: 45 U/L — ABNORMAL HIGH (ref 0–37)
Albumin: 2.2 g/dL — ABNORMAL LOW (ref 3.5–5.2)
Alkaline Phosphatase: 128 U/L — ABNORMAL HIGH (ref 39–117)
BUN: 37 mg/dL — ABNORMAL HIGH (ref 6–23)
CO2: 21 mEq/L (ref 19–32)
Calcium: 8.2 mg/dL — ABNORMAL LOW (ref 8.4–10.5)
Chloride: 111 mEq/L (ref 96–112)
Creatinine, Ser: 1.12 mg/dL (ref 0.50–1.35)
GFR calc Af Amer: 70 mL/min — ABNORMAL LOW (ref >90)
GFR calc non Af Amer: 60 mL/min — ABNORMAL LOW (ref >90)
Glucose, Bld: 190 mg/dL — ABNORMAL HIGH (ref 70–99)
Potassium: 4.1 mEq/L (ref 3.7–5.3)
Sodium: 143 mEq/L (ref 137–147)
Total Bilirubin: 0.4 mg/dL (ref 0.3–1.2)
Total Protein: 6.1 g/dL (ref 6.0–8.3)

## 2013-02-28 LAB — PREALBUMIN: Prealbumin: 11.1 mg/dL — ABNORMAL LOW (ref 17.0–34.0)

## 2013-02-28 MED ORDER — TRAMADOL HCL 50 MG PO TABS
50.0000 mg | ORAL_TABLET | Freq: Four times a day (QID) | ORAL | Status: DC | PRN
  Administered 2013-02-28 – 2013-03-05 (×8): 100 mg via ORAL
  Administered 2013-03-05 – 2013-03-07 (×3): 50 mg via ORAL
  Filled 2013-02-28 (×2): qty 2
  Filled 2013-02-28 (×2): qty 1
  Filled 2013-02-28 (×6): qty 2
  Filled 2013-02-28: qty 1
  Filled 2013-02-28: qty 2
  Filled 2013-02-28: qty 1

## 2013-02-28 MED ORDER — FUROSEMIDE 40 MG PO TABS
40.0000 mg | ORAL_TABLET | Freq: Every day | ORAL | Status: DC
  Administered 2013-03-01 – 2013-03-09 (×9): 40 mg via ORAL
  Filled 2013-02-28 (×11): qty 1

## 2013-02-28 MED ORDER — ACETAMINOPHEN 500 MG PO TABS
1000.0000 mg | ORAL_TABLET | Freq: Four times a day (QID) | ORAL | Status: AC
  Administered 2013-02-28 – 2013-03-01 (×8): 1000 mg via ORAL
  Filled 2013-02-28 (×6): qty 2

## 2013-02-28 MED ORDER — OXYCODONE HCL 5 MG PO TABS
5.0000 mg | ORAL_TABLET | ORAL | Status: DC | PRN
  Administered 2013-02-28: 5 mg via ORAL
  Filled 2013-02-28: qty 1

## 2013-02-28 MED ORDER — POTASSIUM CHLORIDE CRYS ER 20 MEQ PO TBCR
20.0000 meq | EXTENDED_RELEASE_TABLET | Freq: Every day | ORAL | Status: DC
Start: 2013-03-01 — End: 2013-03-09
  Administered 2013-03-01 – 2013-03-09 (×9): 20 meq via ORAL
  Filled 2013-02-28 (×11): qty 1

## 2013-02-28 MED ORDER — OXYCODONE HCL 5 MG PO TABS
5.0000 mg | ORAL_TABLET | ORAL | Status: DC | PRN
  Administered 2013-03-02 – 2013-03-03 (×3): 10 mg via ORAL
  Administered 2013-03-03: 5 mg via ORAL
  Filled 2013-02-28: qty 1
  Filled 2013-02-28 (×3): qty 2

## 2013-02-28 NOTE — Progress Notes (Signed)
Family concerned about Cp and swelling in his feet  He c/o pain just to the right of his sternal incision and can point to a spot on the chest wall were it hurts. He is tender in that area. His sternum is stable. Wound is intact. He has trace edema in his feet. No pretibial edema. Lasix was stopped when he was transferred back to SICU  BP 122/61  Pulse 80  Temp(Src) 97.9 F (36.6 C) (Oral)  Resp 24  Ht 6\' 1"  (1.854 m)  Wt 155 lb 3.3 oz (70.4 kg)  BMI 20.48 kg/m2  SpO2 94%   Intake/Output Summary (Last 24 hours) at 02/28/13 1745 Last data filed at 02/28/13 1700  Gross per 24 hour  Intake   1900 ml  Output    805 ml  Net   1095 ml   Will restart lasix tomorrow

## 2013-02-28 NOTE — Progress Notes (Signed)
8 Days Post-Op Procedure(s) (LRB): CORONARY ARTERY BYPASS GRAFTING (CABG) (N/A) AORTIC VALVE REPLACEMENT (AVR) (N/A) INTRAOPERATIVE TRANSESOPHAGEAL ECHOCARDIOGRAM (N/A) Subjective: C/o incisional pain this AM Frequent cough last night  Objective: Vital signs in last 24 hours: Temp:  [97.5 F (36.4 C)-97.9 F (36.6 C)] 97.8 F (36.6 C) (01/10 0755) Pulse Rate:  [73-104] 81 (01/10 0641) Cardiac Rhythm:  [-] Atrial fibrillation (01/10 0641) Resp:  [15-25] 25 (01/10 0641) BP: (106-150)/(46-91) 113/65 mmHg (01/10 0916) SpO2:  [92 %-100 %] 95 % (01/10 0804) Weight:  [155 lb 3.3 oz (70.4 kg)] 155 lb 3.3 oz (70.4 kg) (01/10 0500)  Hemodynamic parameters for last 24 hours:    Intake/Output from previous day: 01/09 0701 - 01/10 0700 In: 1890 [P.O.:640; I.V.:1100; IV Piggyback:150] Out: 530 [Urine:530] Intake/Output this shift:    General appearance: alert, cooperative and no distress Neurologic: intact Heart: irregularly irregular rhythm Lungs: diminished breath sounds bibasilar Wound: minimal drainage, sternum stable  Lab Results:  Recent Labs  02/27/13 0445 02/28/13 0400  WBC 8.9 12.8*  HGB 7.9* 7.3*  HCT 22.5* 21.5*  PLT 177 196   BMET:  Recent Labs  02/27/13 0445 02/28/13 0400  NA 142 143  K 4.5 4.1  CL 108 111  CO2 20 21  GLUCOSE 167* 190*  BUN 31* 37*  CREATININE 1.04 1.12  CALCIUM 8.3* 8.2*    PT/INR: No results found for this basename: LABPROT, INR,  in the last 72 hours ABG    Component Value Date/Time   PHART 7.450 02/26/2013 1620   HCO3 19.4* 02/26/2013 1620   TCO2 20 02/26/2013 1620   ACIDBASEDEF 4.0* 02/26/2013 1620   O2SAT 84.0 02/26/2013 1620   CBG (last 3)   Recent Labs  02/25/13 2140 02/26/13 0617 02/26/13 1241  GLUCAP 119* 111* 114*    Assessment/Plan: S/P Procedure(s) (LRB): CORONARY ARTERY BYPASS GRAFTING (CABG) (N/A) AORTIC VALVE REPLACEMENT (AVR) (N/A) INTRAOPERATIVE TRANSESOPHAGEAL ECHOCARDIOGRAM (N/A) - CV- in a fib- on  diltiazem- give diltiazem now  Prn metoprolol, no coumadin  RESP- influenza- lungs relatively clear this AM  RENAL- stable  ENDO- CBG OK  GI appetite improving   LOS: 8 days    Shamecca Whitebread C 02/28/2013

## 2013-03-01 ENCOUNTER — Inpatient Hospital Stay (HOSPITAL_COMMUNITY): Payer: Medicare Other

## 2013-03-01 LAB — CBC
HCT: 21.9 % — ABNORMAL LOW (ref 39.0–52.0)
Hemoglobin: 7.4 g/dL — ABNORMAL LOW (ref 13.0–17.0)
MCH: 30 pg (ref 26.0–34.0)
MCHC: 33.8 g/dL (ref 30.0–36.0)
MCV: 88.7 fL (ref 78.0–100.0)
Platelets: 224 10*3/uL (ref 150–400)
RBC: 2.47 MIL/uL — ABNORMAL LOW (ref 4.22–5.81)
RDW: 15.1 % (ref 11.5–15.5)
WBC: 14 10*3/uL — ABNORMAL HIGH (ref 4.0–10.5)

## 2013-03-01 LAB — BASIC METABOLIC PANEL
BUN: 37 mg/dL — ABNORMAL HIGH (ref 6–23)
CO2: 22 mEq/L (ref 19–32)
Calcium: 8.4 mg/dL (ref 8.4–10.5)
Chloride: 109 mEq/L (ref 96–112)
Creatinine, Ser: 1.21 mg/dL (ref 0.50–1.35)
GFR calc Af Amer: 63 mL/min — ABNORMAL LOW (ref >90)
GFR calc non Af Amer: 55 mL/min — ABNORMAL LOW (ref >90)
Glucose, Bld: 170 mg/dL — ABNORMAL HIGH (ref 70–99)
Potassium: 4.2 mEq/L (ref 3.7–5.3)
Sodium: 143 mEq/L (ref 137–147)

## 2013-03-01 LAB — GLUCOSE, CAPILLARY: Glucose-Capillary: 165 mg/dL — ABNORMAL HIGH (ref 70–99)

## 2013-03-01 MED ORDER — METOPROLOL TARTRATE 25 MG PO TABS
25.0000 mg | ORAL_TABLET | Freq: Two times a day (BID) | ORAL | Status: DC
  Administered 2013-03-01 – 2013-03-09 (×17): 25 mg via ORAL
  Filled 2013-03-01 (×19): qty 1

## 2013-03-01 NOTE — Progress Notes (Addendum)
TCTS DAILY ICU PROGRESS NOTE                   [address]            [place] [address]          [phone]   9 Days Post-Op Procedure(s) (LRB): CORONARY ARTERY BYPASS GRAFTING (CABG) (N/A) AORTIC VALVE REPLACEMENT (AVR) (N/A) INTRAOPERATIVE TRANSESOPHAGEAL ECHOCARDIOGRAM (N/A)  Total Length of Stay:  LOS: 9 days   Subjective: OOB in chair.  Still coughing some, but overall feeling better.   Objective: Vital signs in last 24 hours: Temp:  [97.3 F (36.3 C)-97.9 F (36.6 C)] 97.7 F (36.5 C) (01/11 1140) Pulse Rate:  [70-127] 114 (01/11 1100) Cardiac Rhythm:  [-] Atrial fibrillation (01/11 1149) Resp:  [14-24] 22 (01/11 1100) BP: (108-148)/(54-82) 148/82 mmHg (01/11 1100) SpO2:  [88 %-97 %] 94 % (01/11 1100) Weight:  [157 lb 6.5 oz (71.4 kg)] 157 lb 6.5 oz (71.4 kg) (01/11 0500)  Filed Weights   02/27/13 0500 02/28/13 0500 03/01/13 0500  Weight: 152 lb 1.9 oz (69 kg) 155 lb 3.3 oz (70.4 kg) 157 lb 6.5 oz (71.4 kg)    Weight change: 2 lb 3.3 oz (1 kg)   Hemodynamic parameters for last 24 hours:    Intake/Output from previous day: 01/10 0701 - 01/11 0700 In: 1150 [P.O.:600; I.V.:400; IV Piggyback:150] Out: 675 [Urine:675]  Intake/Output this shift: Total I/O In: 100 [P.O.:100] Out: 100 [Urine:100]  Current Meds: Scheduled Meds: . acetaminophen  1,000 mg Oral QID  . aspirin EC  81 mg Oral Daily  . bisacodyl  10 mg Oral Daily   Or  . bisacodyl  10 mg Rectal Daily  . budesonide-formoterol  2 puff Inhalation BID  . busPIRone  7.5 mg Oral BID  . calcium citrate  200 mg of elemental calcium Oral Daily  . cefTAZidime (FORTAZ)  IV  1 g Intravenous Q8H  . cholecalciferol  1,000 Units Oral Daily  . dextromethorphan-guaiFENesin  1 tablet Oral BID  . diltiazem  240 mg Oral Daily  . docusate sodium  200 mg Oral Daily  . enoxaparin (LOVENOX) injection  30 mg Subcutaneous Q24H  . feeding supplement (ENSURE)  1 Container Oral TID WC  . ferrous  [id number] C-folic acid  1 capsule Oral TID PC  . furosemide  40 mg Oral Daily  . hydrALAZINE  25 mg Oral Q8H  . ketotifen  1 drop Both Eyes Daily  . levalbuterol  0.63 mg Nebulization TID  . loratadine  10 mg Oral Daily  . oseltamivir  30 mg Oral BID  . pantoprazole  40 mg Oral Daily  . polyethylene glycol  17 g Oral Daily  . potassium chloride  20 mEq Oral Daily  . pravastatin  40 mg Oral q1800  . sodium chloride  10-40 mL Intracatheter Q12H  . thiamine  100 mg Oral Daily  . topiramate  75 mg Oral Daily   Continuous Infusions:  PRN Meds:.alum & mag hydroxide-simeth, fluticasone, metoprolol, ondansetron (ZOFRAN) IV, oxyCODONE, sodium chloride, sodium chloride, traMADol, zolpidem    Physical Exam: General appearance: alert, cooperative and no distress Heart: Irregularly irregular, rates 100-110s Lungs: Coarse rhonchi with few exp wheezes Extremities: No significant LE edema Wound: Clean and dry     Lab Results: CBC: Recent Labs  02/28/13 0400 03/01/13 0410  WBC 12.8* 14.0*  HGB 7.3* 7.4*  HCT 21.5* 21.9*  PLT 196 224   BMET:  Recent Labs  02/28/13 0400 03/01/13 0410  NA 143 143  K 4.1 4.2  CL 111 109  CO2 21 22  GLUCOSE 190* 170*  BUN 37* 37*  CREATININE 1.12 1.21  CALCIUM 8.2* 8.4    PT/INR: No results found for this basename: LABPROT, INR,  in the last 72 hours Radiology: Dg Chest Port 1 View  03/01/2013   CLINICAL DATA:  ARDS  EXAM: PORTABLE CHEST - 1 VIEW  COMPARISON:  02/28/2013  FINDINGS: Previous coronary bypass changes noted. Mild cardiomegaly with vascular congestion. Persistent bilateral upper lobe and left lower lobe airspace opacities. Minimal right base atelectasis. Small effusions remain. No significant interval change. No pneumothorax.  IMPRESSION: Stable asymmetric bilateral airspace process compatible with ARDS or pneumonia. No pneumothorax.   Electronically Signed   By: Daryll Brod M.D.   On: 03/01/2013 09:05   Dg Chest Port 1  View  02/28/2013   CLINICAL DATA:  ARDS  EXAM: PORTABLE CHEST - 1 VIEW  COMPARISON:  02/27/2013  FINDINGS: Prior coronary bypass changes noted. Left PICC line tip at the SVC RA junction. Atherosclerosis present of the aorta. Asymmetric Stable airspace process throughout both lungs, most pronounced in the upper lobes and in the left lower lobe. No enlarging effusion or pneumothorax. No significant interval change.  IMPRESSION: Stable bilateral airspace process in the upper lobes and left lower lobe compatible with ARDS versus pneumonia.   Electronically Signed   By: Daryll Brod M.D.   On: 02/28/2013 08:22     Assessment/Plan: S/P Procedure(s) (LRB): CORONARY ARTERY BYPASS GRAFTING (CABG) (N/A) AORTIC VALVE REPLACEMENT (AVR) (N/A) INTRAOPERATIVE TRANSESOPHAGEAL ECHOCARDIOGRAM (N/A)  CV- AF, rates 100-110s.  On po Diltiazem and prn IV Lopressor. Amio and Lopressor d/c'ed due to wheezing. Not on Coumadin. Since his heart rate is elevated, will resume low dose beta blocker and monitor pulm status.  Influenza A- Continue Tamiflu, steroids.WBC up slightly today, but could be steroid related.  Pulm- some wheezing this am.  Continue pulm toilet, Tressie Ellis. CXR generally stable today.  Vol overload- Lasix restarted.    Acute on chronic anemia- H/H stable.      COLLINS,GINA H 03/01/2013 12:41 PM  Patient seen and examined, agree with above Rate control an issue, will restart lopressor

## 2013-03-02 ENCOUNTER — Inpatient Hospital Stay (HOSPITAL_COMMUNITY): Payer: Medicare Other

## 2013-03-02 LAB — CBC
HCT: 21.2 % — ABNORMAL LOW (ref 39.0–52.0)
HCT: 27.4 % — ABNORMAL LOW (ref 39.0–52.0)
Hemoglobin: 7.1 g/dL — ABNORMAL LOW (ref 13.0–17.0)
Hemoglobin: 9.2 g/dL — ABNORMAL LOW (ref 13.0–17.0)
MCH: 30.2 pg (ref 26.0–34.0)
MCH: 29.4 pg (ref 26.0–34.0)
MCHC: 33.5 g/dL (ref 30.0–36.0)
MCHC: 33.6 g/dL (ref 30.0–36.0)
MCV: 90.2 fL (ref 78.0–100.0)
MCV: 87.5 fL (ref 78.0–100.0)
Platelets: 233 10*3/uL (ref 150–400)
Platelets: 263 10*3/uL (ref 150–400)
RBC: 2.35 MIL/uL — ABNORMAL LOW (ref 4.22–5.81)
RBC: 3.13 MIL/uL — ABNORMAL LOW (ref 4.22–5.81)
RDW: 15.2 % (ref 11.5–15.5)
RDW: 16 % — ABNORMAL HIGH (ref 11.5–15.5)
WBC: 16 10*3/uL — ABNORMAL HIGH (ref 4.0–10.5)
WBC: 20.5 10*3/uL — ABNORMAL HIGH (ref 4.0–10.5)

## 2013-03-02 LAB — BASIC METABOLIC PANEL
BUN: 34 mg/dL — ABNORMAL HIGH (ref 6–23)
CO2: 23 mEq/L (ref 19–32)
Calcium: 8.4 mg/dL (ref 8.4–10.5)
Chloride: 109 mEq/L (ref 96–112)
Creatinine, Ser: 1.18 mg/dL (ref 0.50–1.35)
GFR calc Af Amer: 65 mL/min — ABNORMAL LOW (ref >90)
GFR calc non Af Amer: 56 mL/min — ABNORMAL LOW (ref >90)
Glucose, Bld: 121 mg/dL — ABNORMAL HIGH (ref 70–99)
Potassium: 4.6 mEq/L (ref 3.7–5.3)
Sodium: 141 mEq/L (ref 137–147)

## 2013-03-02 LAB — GLUCOSE, CAPILLARY
Glucose-Capillary: 116 mg/dL — ABNORMAL HIGH (ref 70–99)
Glucose-Capillary: 184 mg/dL — ABNORMAL HIGH (ref 70–99)

## 2013-03-02 LAB — PREPARE RBC (CROSSMATCH)

## 2013-03-02 MED ORDER — WARFARIN SODIUM 2 MG PO TABS
2.0000 mg | ORAL_TABLET | Freq: Every day | ORAL | Status: DC
  Administered 2013-03-02 – 2013-03-03 (×2): 2 mg via ORAL
  Filled 2013-03-02 (×3): qty 1

## 2013-03-02 MED ORDER — WARFARIN - PHYSICIAN DOSING INPATIENT
Freq: Every day | Status: DC
  Administered 2013-03-05: 18:00:00

## 2013-03-02 MED ORDER — MAGIC MOUTHWASH
10.0000 mL | Freq: Three times a day (TID) | ORAL | Status: DC
  Administered 2013-03-02 – 2013-03-06 (×15): 10 mL via ORAL
  Filled 2013-03-02 (×20): qty 10

## 2013-03-02 MED ORDER — FLUCONAZOLE 100 MG PO TABS
100.0000 mg | ORAL_TABLET | Freq: Every day | ORAL | Status: AC
  Administered 2013-03-02 – 2013-03-05 (×4): 100 mg via ORAL
  Filled 2013-03-02 (×4): qty 1

## 2013-03-02 NOTE — Progress Notes (Signed)
10 Days Post-Op Procedure(s) (LRB): CORONARY ARTERY BYPASS GRAFTING (CABG) (N/A) AORTIC VALVE REPLACEMENT (AVR) (N/A) INTRAOPERATIVE TRANSESOPHAGEAL ECHOCARDIOGRAM (N/A) Subjective: CABG-AVR with post op influenza A, pneumonitis Anemia Hb 7.1 afib- chronic Elevated WBC 14k Objective: Vital signs in last 24 hours: Temp:  [97.7 F (36.5 C)-98.4 F (36.9 C)] 98.4 F (36.9 C) (01/12 0000) Pulse Rate:  [53-127] 92 (01/12 0700) Cardiac Rhythm:  [-] Atrial fibrillation (01/12 0700) Resp:  [14-25] 19 (01/12 0700) BP: (108-148)/(45-91) 134/75 mmHg (01/12 0700) SpO2:  [88 %-100 %] 99 % (01/12 0600) Weight:  [158 lb 4.6 oz (71.8 kg)] 158 lb 4.6 oz (71.8 kg) (01/12 0500)  Hemodynamic parameters for last 24 hours:  stable BP afebrile  Intake/Output from previous day: 01/11 0701 - 01/12 0700 In: 350 [P.O.:300; IV Piggyback:50] Out: 1500 [Urine:1500] Intake/Output this shift:    Scattered rhonchi sluured speech w/ sl mouth droop extrem warm Lab Results:  Recent Labs  03/01/13 0410 03/02/13 0452  WBC 14.0* 16.0*  HGB 7.4* 7.1*  HCT 21.9* 21.2*  PLT 224 233   BMET:  Recent Labs  03/01/13 0410 03/02/13 0452  NA 143 141  K 4.2 4.6  CL 109 109  CO2 22 23  GLUCOSE 170* 121*  BUN 37* 34*  CREATININE 1.21 1.18  CALCIUM 8.4 8.4    PT/INR: No results found for this basename: LABPROT, INR,  in the last 72 hours ABG    Component Value Date/Time   PHART 7.450 02/26/2013 1620   HCO3 19.4* 02/26/2013 1620   TCO2 20 02/26/2013 1620   ACIDBASEDEF 4.0* 02/26/2013 1620   O2SAT 84.0 02/26/2013 1620   CBG (last 3)   Recent Labs  03/01/13 1949 03/02/13 0015 03/02/13 0439  GLUCAP 165* 184* 116*    Assessment/Plan: S/P Procedure(s) (LRB): CORONARY ARTERY BYPASS GRAFTING (CABG) (N/A) AORTIC VALVE REPLACEMENT (AVR) (N/A) INTRAOPERATIVE TRANSESOPHAGEAL ECHOCARDIOGRAM (N/A)   Transfuse PRBC Head CT eval CVA Coumadin for persistent afib CXR with persistent infiltrates - cont ICU  care  LOS: 10 days    [name],PETER 03/02/2013

## 2013-03-02 NOTE — Progress Notes (Signed)
Physical Therapy Treatment Patient Details Name: Robert Richard MRN: [id number] DOB: Jul 19, 1930 Today's Date: 03/02/2013 Time: 4656-8127 PT Time Calculation (min): 26 min  PT Assessment / Plan / Recommendation  History of Present Illness Pt adm for CABG x3 and AVR.Pt developed flu and transferred back to ICU for respiratory distress. PMHx LLE thrombectomy; afib; recent decr exercise tolerance due to CAD, aortic stenosis, COPD   PT Comments   Pt continues to progress after set-back due to influenza. His ultimate goal is to not need a device for ambulation, which he will achieve but anticipate he will need RW on discharge.    Follow Up Recommendations  Home health PT;Supervision for mobility/OOB     Does the patient have the potential to tolerate intense rehabilitation     Barriers to Discharge        Equipment Recommendations  Rolling walker with 5" wheels;3in1 (PT)    Recommendations for Other Services    Frequency Min 3X/week   Progress towards PT Goals Progress towards PT goals: Progressing toward goals (Goal update next visit)  Plan Current plan remains appropriate    Precautions / Restrictions Precautions Precautions: Sternal;Fall   Pertinent Vitals/Pain HR 91-104 SaO2 94-98% on 3L O2    Mobility  Transfers Overall transfer level: Needs assistance Equipment used: Rolling walker (2 wheeled) Transfers: Sit to/from Stand Sit to Stand: Mod assist General transfer comment: min assist to edge of chair; assist to move forward over feet as coming to stand and to assist with hip/knee extension Ambulation/Gait Ambulation/Gait assistance: Min guard Ambulation Distance (Feet): 350 Feet Assistive device: Rolling walker (2 wheeled) Gait Pattern/deviations: Step-through pattern;Decreased stride length;Decreased dorsiflexion - right;Shuffle;Trunk flexed General Gait Details: mod cues for incr step length and focus on heelstrike to improve Rt dorsiflexion; pt maintains for ~15-20  feet and then requires vc    Exercises     PT Diagnosis:    PT Problem List:   PT Treatment Interventions:     PT Goals (current goals can now be found in the care plan section)    Visit Information  Last PT Received On: 03/02/13 Assistance Needed: +1 History of Present Illness: Pt adm for CABG x3 and AVR.Pt developed flu and transferred back to ICU for respiratory distress. PMHx LLE thrombectomy; afib; recent decr exercise tolerance due to CAD, aortic stenosis, COPD    Subjective Data      Cognition  Cognition Arousal/Alertness: Awake/alert Behavior During Therapy: WFL for tasks assessed/performed Overall Cognitive Status: Within Functional Limits for tasks assessed    Balance  Balance Overall balance assessment: Needs assistance Standing balance support: No upper extremity supported Standing balance-Leahy Scale: Good [place] - Eyes Opened: 30 High Level Balance Comments: pt performed marching and step forward/backward and then walking backwards without UE support with min guard assist   End of Session PT - End of Session Equipment Utilized During Treatment: Gait belt;Oxygen Activity Tolerance: Patient tolerated treatment well Patient left: in chair;with call bell/phone within reach Nurse Communication: Mobility status   GP     Kalijah Zeiss 03/02/2013, 4:41 PM Pager [phone]

## 2013-03-02 NOTE — Progress Notes (Signed)
TCTS BRIEF SICU PROGRESS NOTE  10 Days Post-Op  S/P Procedure(s) (LRB): CORONARY ARTERY BYPASS GRAFTING (CABG) (N/A) AORTIC VALVE REPLACEMENT (AVR) (N/A) INTRAOPERATIVE TRANSESOPHAGEAL ECHOCARDIOGRAM (N/A)   Overall stable day Rate controlled Afib w/ stable BP O2 sats 97% on 2 L/min via [place]  CT HEAD WITHOUT CONTRAST  TECHNIQUE:  Contiguous axial images were obtained from the base of the skull  through the vertex without intravenous contrast.  COMPARISON: MRI 12/12/2012  FINDINGS:  Mild atrophy. Negative for acute infarct, hemorrhage, or mass  lesion. Hypodensity right occipital white matter consistent with  chronic ischemia. No mass effect or midline shift. Small chronic  infarcts cerebellum bilaterally.  Calvarium is intact.  IMPRESSION:  No acute abnormality.  Electronically Signed  By: Franchot Gallo M.D.  On: 03/02/2013 09:24   Plan: Continue current plan  Shannette Tabares H 03/02/2013 5:48 PM

## 2013-03-03 ENCOUNTER — Inpatient Hospital Stay (HOSPITAL_COMMUNITY): Payer: Medicare Other

## 2013-03-03 LAB — CBC
HCT: 25.2 % — ABNORMAL LOW (ref 39.0–52.0)
Hemoglobin: 8.3 g/dL — ABNORMAL LOW (ref 13.0–17.0)
MCH: 29.4 pg (ref 26.0–34.0)
MCHC: 32.9 g/dL (ref 30.0–36.0)
MCV: 89.4 fL (ref 78.0–100.0)
Platelets: 225 10*3/uL (ref 150–400)
RBC: 2.82 MIL/uL — ABNORMAL LOW (ref 4.22–5.81)
RDW: 16.1 % — ABNORMAL HIGH (ref 11.5–15.5)
WBC: 13.9 10*3/uL — ABNORMAL HIGH (ref 4.0–10.5)

## 2013-03-03 LAB — PROTIME-INR
INR: 1.26 (ref 0.00–1.49)
Prothrombin Time: 15.5 seconds — ABNORMAL HIGH (ref 11.6–15.2)

## 2013-03-03 LAB — COMPREHENSIVE METABOLIC PANEL
ALT: 44 U/L (ref 0–53)
AST: 24 U/L (ref 0–37)
Albumin: 2.2 g/dL — ABNORMAL LOW (ref 3.5–5.2)
Alkaline Phosphatase: 97 U/L (ref 39–117)
BUN: 27 mg/dL — ABNORMAL HIGH (ref 6–23)
CO2: 23 mEq/L (ref 19–32)
Calcium: 8 mg/dL — ABNORMAL LOW (ref 8.4–10.5)
Chloride: 106 mEq/L (ref 96–112)
Creatinine, Ser: 1.08 mg/dL (ref 0.50–1.35)
GFR calc Af Amer: 73 mL/min — ABNORMAL LOW (ref >90)
GFR calc non Af Amer: 63 mL/min — ABNORMAL LOW (ref >90)
Glucose, Bld: 106 mg/dL — ABNORMAL HIGH (ref 70–99)
Potassium: 4.5 mEq/L (ref 3.7–5.3)
Sodium: 139 mEq/L (ref 137–147)
Total Bilirubin: 0.3 mg/dL (ref 0.3–1.2)
Total Protein: 5.6 g/dL — ABNORMAL LOW (ref 6.0–8.3)

## 2013-03-03 MED ORDER — ENOXAPARIN SODIUM 40 MG/0.4ML ~~LOC~~ SOLN
40.0000 mg | SUBCUTANEOUS | Status: DC
  Administered 2013-03-03: 40 mg via SUBCUTANEOUS
  Filled 2013-03-03 (×2): qty 0.4

## 2013-03-03 MED ORDER — ACYCLOVIR 200 MG PO CAPS
200.0000 mg | ORAL_CAPSULE | Freq: Four times a day (QID) | ORAL | Status: AC
  Administered 2013-03-03 – 2013-03-07 (×16): 200 mg via ORAL
  Filled 2013-03-03 (×16): qty 1

## 2013-03-03 NOTE — Progress Notes (Signed)
Patient ID: Robert Richard, male   DOB: [birth year], 80 y.o.   MRN: [id number] SICU Evening Rounds  Hemodynamically stable.  Sats 95% on [place]  Good urine output  Awaiting transfer to stepdown.

## 2013-03-03 NOTE — Progress Notes (Signed)
11 Days Post-Op Procedure(s) (LRB): CORONARY ARTERY BYPASS GRAFTING (CABG) (N/A) AORTIC VALVE REPLACEMENT (AVR) (N/A) INTRAOPERATIVE TRANSESOPHAGEAL ECHOCARDIOGRAM (N/A) Subjective: CABG AVR jan 2 Readmission to ICU for influenza A w/ ARDS Pul status now improved- 2 L O2 CXR w/ improvement of bilateral infiltrates Postop anemia- 1 unit PRBC yesterday Objective: Vital signs in last 24 hours: Temp:  [97.5 F (36.4 C)-98.5 F (36.9 C)] 98.3 F (36.8 C) (01/13 0745) Pulse Rate:  [50-120] 88 (01/13 0600) Cardiac Rhythm:  [-] Atrial fibrillation (01/13 0600) Resp:  [11-29] 23 (01/13 0600) BP: (96-139)/(48-84) 127/58 mmHg (01/13 0600) SpO2:  [92 %-100 %] 97 % (01/13 0600) Weight:  [154 lb 5.2 oz (70 kg)] 154 lb 5.2 oz (70 kg) (01/13 0500)  Hemodynamic parameters for last 24 hours:  chronic A-fib on coumadin- lovenox for now  Intake/Output from previous day: 01/12 0701 - 01/13 0700 In: 727 [P.O.:360; I.V.:20; Blood:297; IV Piggyback:50] Out: 1575 [Urine:1575] Intake/Output this shift:    Wet cough Scattered  rhonchi Incision clean some facial droop and slurred speech- Head CT old infarcts, no recent CVA Lab Results:  Recent Labs  03/02/13 1548 03/03/13 0420  WBC 20.5* 13.9*  HGB 9.2* 8.3*  HCT 27.4* 25.2*  PLT 263 225   BMET:  Recent Labs  03/02/13 0452 03/03/13 0420  NA 141 139  K 4.6 4.5  CL 109 106  CO2 23 23  GLUCOSE 121* 106*  BUN 34* 27*  CREATININE 1.18 1.08  CALCIUM 8.4 8.0*    PT/INR:  Recent Labs  03/03/13 0420  LABPROT 15.5*  INR 1.26   ABG    Component Value Date/Time   PHART 7.450 02/26/2013 1620   HCO3 19.4* 02/26/2013 1620   TCO2 20 02/26/2013 1620   ACIDBASEDEF 4.0* 02/26/2013 1620   O2SAT 84.0 02/26/2013 1620   CBG (last 3)   Recent Labs  03/01/13 1949 03/02/13 0015 03/02/13 0439  GLUCAP 165* 184* 116*    Assessment/Plan: S/P Procedure(s) (LRB): CORONARY ARTERY BYPASS GRAFTING (CABG) (N/A) AORTIC VALVE REPLACEMENT (AVR)  (N/A) INTRAOPERATIVE TRANSESOPHAGEAL ECHOCARDIOGRAM (N/A) Plan for transfer to step-down: see transfer orders Cont coumadin for afib  LOS: 11 days    [name],Samad Thon 03/03/2013

## 2013-03-04 ENCOUNTER — Inpatient Hospital Stay (HOSPITAL_COMMUNITY): Payer: Medicare Other

## 2013-03-04 LAB — CBC
HCT: 27.2 % — ABNORMAL LOW (ref 39.0–52.0)
Hemoglobin: 9.2 g/dL — ABNORMAL LOW (ref 13.0–17.0)
MCH: 29.7 pg (ref 26.0–34.0)
MCHC: 33.8 g/dL (ref 30.0–36.0)
MCV: 87.7 fL (ref 78.0–100.0)
Platelets: 280 10*3/uL (ref 150–400)
RBC: 3.1 MIL/uL — ABNORMAL LOW (ref 4.22–5.81)
RDW: 15.6 % — ABNORMAL HIGH (ref 11.5–15.5)
WBC: 19.1 10*3/uL — ABNORMAL HIGH (ref 4.0–10.5)

## 2013-03-04 LAB — PROTIME-INR
INR: 1.7 — ABNORMAL HIGH (ref 0.00–1.49)
Prothrombin Time: 19.5 seconds — ABNORMAL HIGH (ref 11.6–15.2)

## 2013-03-04 LAB — CULTURE, BLOOD (SINGLE): Culture: NO GROWTH

## 2013-03-04 MED ORDER — WARFARIN SODIUM 1 MG PO TABS
1.0000 mg | ORAL_TABLET | Freq: Every day | ORAL | Status: DC
  Administered 2013-03-04 – 2013-03-08 (×5): 1 mg via ORAL
  Filled 2013-03-04 (×6): qty 1

## 2013-03-04 NOTE — Progress Notes (Signed)
Physical Therapy Treatment Patient Details Name: Robert Richard MRN: [id number] DOB: [birth year] Today's Date: 03/04/2013 Time: 6948-5462 PT Time Calculation (min): 34 min  PT Assessment / Plan / Recommendation  History of Present Illness Pt adm for CABG x3 and AVR.Pt developed flu and transferred back to ICU for respiratory distress. PMHx LLE thrombectomy; afib; recent decr exercise tolerance due to CAD, aortic stenosis, COPD   PT Comments   Pt weaker overall today (more frequently dragging RLE, HR higher with activity). Wife not present today, however wonder if she will be able to manage pt as he continues to need mod assist to stand. He has had several set-backs and anticipate he can return home, but likely will need post-acute therapy prior to home.   Follow Up Recommendations  CIR;Supervision/Assistance - 24 hour     Does the patient have the potential to tolerate intense rehabilitation     Barriers to Discharge        Equipment Recommendations  Rolling walker with 5" wheels;3in1 (PT)    Recommendations for Other Services    Frequency Min 3X/week   Progress towards PT Goals Progress towards PT goals: Not progressing toward goals - comment (weaker overall today)  Plan Discharge plan needs to be updated (unsure wife can physically provide mod assist sit to stand)    Precautions / Restrictions Precautions Precautions: Sternal;Fall Precaution Comments: pt only able to state he should not lift; even with prompts could not recall sternal precautions and re-instructed   Pertinent Vitals/Pain HR 110-131max (while walking varied 128-133 primarily, however as fatigued incr to 145 bpm for last 50 ft) SaO2 93-95% on 2L throughout activity, however more dyspneic      Mobility  Transfers Overall transfer level: Needs assistance Equipment used: Rolling walker (2 wheeled) Transfers: Sit to/from Stand Sit to Stand: Mod assist General transfer comment: pt initially asking for PT to lift  him up; encouraged to attempt himself; requires assist for anterior wt-shift; intended to practice additional transfers after ambulation, however pt with lots of coughing and incr HR and deferred Ambulation/Gait Ambulation/Gait assistance: Min guard Ambulation Distance (Feet): 250 Feet Assistive device: Rolling walker (2 wheeled) Gait Pattern/deviations: Step-through pattern;Decreased step length - left;Decreased dorsiflexion - right;Decreased weight shift to left Gait velocity interpretation: Below normal speed for age/gender General Gait Details: max cues for incr step length and focus on heelstrike to improve Rt dorsiflexion; pt maintains for only 2-3 steps and then requires vc    Exercises     PT Diagnosis:    PT Problem List:   PT Treatment Interventions:     PT Goals (current goals can now be found in the care plan section) Acute Rehab PT Goals PT Goal Formulation: With patient Time For Goal Achievement: 03/18/13 Potential to Achieve Goals: Good  Visit Information  Last PT Received On: 03/04/13 Assistance Needed: +1 History of Present Illness: Pt adm for CABG x3 and AVR.Pt developed flu and transferred back to ICU for respiratory distress. PMHx LLE thrombectomy; afib; recent decr exercise tolerance due to CAD, aortic stenosis, COPD    Subjective Data      Cognition  Cognition Arousal/Alertness: Awake/alert Behavior During Therapy: WFL for tasks assessed/performed Overall Cognitive Status: Within Functional Limits for tasks assessed Memory: Decreased recall of precautions;Decreased short-term memory    Balance  Balance High Level Balance Comments: deferred due to coughing spell and Incr HR  End of Session PT - End of Session Equipment Utilized During Treatment: Gait belt;Oxygen Activity Tolerance: Patient limited by  fatigue Patient left: in chair;with call bell/phone within reach;with nursing/sitter in room Nurse Communication: Mobility status;Other (comment) (HR  higher today; pt appears more fatigued)   GP     Robert Richard 03/04/2013, 11:40 AM Pager [phone]

## 2013-03-04 NOTE — Plan of Care (Signed)
Problem: Phase III - Recovery through Discharge Goal: Maintain Hemodynamic Stability Outcome: Progressing Very slow to progress

## 2013-03-04 NOTE — Progress Notes (Addendum)
NUTRITION FOLLOW UP  DOCUMENTATION CODES Per approved criteria  -Severe malnutrition in the context of acute illness or injury   INTERVENTION:  Continue Ensure Pudding po TID, each supplement provides 170 kcal and 4 grams of protein RD to follow for nutrition care plan  NUTRITION DIAGNOSIS: Inadequate oral intake related to decreased appetite as evidenced by patient report, ongoing  Goal: Pt to meet >/= 90% of their estimated nutrition needs, progrsesing  Monitor:  PO & supplemental intake, weight, labs, I/O's  ASSESSMENT: Patient with PMH of COPD, anemia, colon polyp; presented for AVR-CABG 1/2.  Patient transferred to 2W-Cardiac 1/5 post-op; CXR with new onset bilateral upper lobe pulmonary infiltrates suggesting PNA; transferred back to 2S-Surgical.  Patient discussed in rounds 1/13 AM.  Appetite a bit better.  PO intake remains variable at 25-100% per flowsheet records.  Taking his Ensure Pudding supplements TID.  Awaiting transfer to [facility].  PT recommending [facility] upon discharge.  Height: Ht Readings from Last 1 Encounters:  02/20/13 6\' 1"  (1.854 m)    Weight: Wt Readings from Last 1 Encounters:  03/04/13 151 lb 10.8 oz (68.8 kg)    Re-estimated needs: Kcal: 1800-2000 Protein: 85-95 gm Fluid: 1.8-2.0 L  Skin: chest surgical incision  Diet Order: Cardiac   Intake/Output Summary (Last 24 hours) at 03/04/13 0927 Last data filed at 03/04/13 0506  Gross per 24 hour  Intake    510 ml  Output    610 ml  Net   -100 ml    Labs:   Recent Labs Lab 03/01/13 0410 03/02/13 0452 03/03/13 0420  NA 143 141 139  K 4.2 4.6 4.5  CL 109 109 106  CO2 22 23 23   BUN 37* 34* 27*  CREATININE 1.21 1.18 1.08  CALCIUM 8.4 8.4 8.0*  GLUCOSE 170* 121* 106*    CBG (last 3)   Recent Labs  03/01/13 1949 03/02/13 0015 03/02/13 0439  GLUCAP 165* 184* 116*    Scheduled Meds: . acyclovir  200 mg Oral QID  . aspirin EC  81 mg Oral Daily  . bisacodyl  10 mg Oral  Daily   Or  . bisacodyl  10 mg Rectal Daily  . budesonide-formoterol  2 puff Inhalation BID  . busPIRone  7.5 mg Oral BID  . calcium citrate  200 mg of elemental calcium Oral Daily  . cefTAZidime (FORTAZ)  IV  1 g Intravenous Q8H  . cholecalciferol  1,000 Units Oral Daily  . dextromethorphan-guaiFENesin  1 tablet Oral BID  . diltiazem  240 mg Oral Daily  . docusate sodium  200 mg Oral Daily  . feeding supplement (ENSURE)  1 Container Oral TID WC  . ferrous [id number] C-folic acid  1 capsule Oral TID PC  . fluconazole  100 mg Oral Daily  . furosemide  40 mg Oral Daily  . hydrALAZINE  25 mg Oral Q8H  . ketotifen  1 drop Both Eyes Daily  . levalbuterol  0.63 mg Nebulization TID  . loratadine  10 mg Oral Daily  . magic mouthwash  10 mL Oral TID  . metoprolol tartrate  25 mg Oral BID  . pantoprazole  40 mg Oral Daily  . polyethylene glycol  17 g Oral Daily  . potassium chloride  20 mEq Oral Daily  . pravastatin  40 mg Oral q1800  . sodium chloride  10-40 mL Intracatheter Q12H  . thiamine  100 mg Oral Daily  . topiramate  75 mg Oral Daily  . warfarin  2 mg Oral q1800  . Warfarin - Physician Dosing Inpatient   Does not apply q1800    Continuous Infusions:    Past Medical History  Diagnosis Date  . Anemia   . Colon polyp   . COPD (chronic obstructive pulmonary disease)     Astmatic bronchitis  . Asthma with bronchitis   . Gout     PMH  . Osteoporosis   . Allergic rhinitis   . Skin cancer, basal cell     Dr Nevada Crane, PMH  . Nephrolithiasis   . HLD (hyperlipidemia)   . Skin cancer (melanoma) 2012    Right neck  . Arthritis     SHOULDER  . Headache(784.0)   . RLS (restless legs syndrome)   . Dysrhythmia 08/2012    NEW ONSET ATRIAL FIBRILATION    Past Surgical History  Procedure Laterality Date  . Finger surgery      for foreign body  . Colonoscopy w/ polypectomy      Dr Deatra Ina  . Inguinal hernia repair    . Nose surgery      Septal Deviation  . Neck  surgery  12/2010    Melanoma ; [facility]   . Leg surgery  06/2010     Dr.Lawson, for blood clott. Left leg   . Tee without cardioversion N/A 01/23/2013    Procedure: TRANSESOPHAGEAL ECHOCARDIOGRAM (TEE);  Surgeon: Jolaine Artist, MD;  Location: [facility] ENDOSCOPY;  Service: Cardiovascular;  Laterality: N/A;  sign heart cath consent w/tee consent  . Coronary artery bypass graft N/A 02/20/2013    Procedure: CORONARY ARTERY BYPASS GRAFTING (CABG);  Surgeon: Ivin Poot, MD;  Location: [facility];  Service: Open Heart Surgery;  Laterality: N/A;  . Aortic valve replacement N/A 02/20/2013    Procedure: AORTIC VALVE REPLACEMENT (AVR);  Surgeon: Ivin Poot, MD;  Location: [facility];  Service: Open Heart Surgery;  Laterality: N/A;  . Intraoperative transesophageal echocardiogram N/A 02/20/2013    Procedure: INTRAOPERATIVE TRANSESOPHAGEAL ECHOCARDIOGRAM;  Surgeon: Ivin Poot, MD;  Location: [facility];  Service: Open Heart Surgery;  Laterality: N/A;    Arthur Holms, RD, LDN Pager #: [phone] After-Hours Pager #: [phone]

## 2013-03-04 NOTE — Progress Notes (Signed)
T[facility] DAILY ICU PROGRESS NOTE                   [address]            [place] [address]          [phone]   12 Days Post-Op Procedure(s) (LRB): CORONARY ARTERY BYPASS GRAFTING (CABG) (N/A) AORTIC VALVE REPLACEMENT (AVR) (N/A) INTRAOPERATIVE TRANSESOPHAGEAL ECHOCARDIOGRAM (N/A)  Total Length of Stay:  LOS: 12 days   Subjective: Patient just finished walking with PT. Had a "coughing spell" and felt a little nausea. No emesis.  Objective: Vital signs in last 24 hours: Temp:  [98.2 F (36.8 C)-100 F (37.8 C)] 99.5 F (37.5 C) (01/14 0800) Pulse Rate:  [57-112] 111 (01/14 0900) Cardiac Rhythm:  [-] Atrial fibrillation (01/14 0800) Resp:  [12-29] 27 (01/14 0900) BP: (101-142)/(49-87) 141/62 mmHg (01/14 0900) SpO2:  [90 %-98 %] 94 % (01/14 0900) Weight:  [68.8 kg (151 lb 10.8 oz)] 68.8 kg (151 lb 10.8 oz) (01/14 0400)  Filed Weights   03/02/13 0500 03/03/13 0500 03/04/13 0400  Weight: 71.8 kg (158 lb 4.6 oz) 70 kg (154 lb 5.2 oz) 68.8 kg (151 lb 10.8 oz)    Weight change: -1.2 kg (-2 lb 10.3 oz)      Intake/Output from previous day: 01/13 0701 - 01/14 0700 In: 630 [P.O.:480; IV Piggyback:150] Out: 910 [Urine:910]  Intake/Output this shift:    Current Meds: Scheduled Meds: . acyclovir  200 mg Oral QID  . aspirin EC  81 mg Oral Daily  . bisacodyl  10 mg Oral Daily   Or  . bisacodyl  10 mg Rectal Daily  . budesonide-formoterol  2 puff Inhalation BID  . busPIRone  7.5 mg Oral BID  . calcium citrate  200 mg of elemental calcium Oral Daily  . cefTAZidime (FORTAZ)  IV  1 g Intravenous Q8H  . cholecalciferol  1,000 Units Oral Daily  . dextromethorphan-guaiFENesin  1 tablet Oral BID  . diltiazem  240 mg Oral Daily  . docusate sodium  200 mg Oral Daily  . enoxaparin (LOVENOX) injection  40 mg Subcutaneous Q24H  . feeding supplement (ENSURE)  1 Container Oral TID WC  . ferrous [id number]-IDPOEUM C-folic acid  1 capsule Oral TID PC  .  fluconazole  100 mg Oral Daily  . furosemide  40 mg Oral Daily  . hydrALAZINE  25 mg Oral Q8H  . ketotifen  1 drop Both Eyes Daily  . levalbuterol  0.63 mg Nebulization TID  . loratadine  10 mg Oral Daily  . magic mouthwash  10 mL Oral TID  . metoprolol tartrate  25 mg Oral BID  . pantoprazole  40 mg Oral Daily  . polyethylene glycol  17 g Oral Daily  . potassium chloride  20 mEq Oral Daily  . pravastatin  40 mg Oral q1800  . sodium chloride  10-40 mL Intracatheter Q12H  . thiamine  100 mg Oral Daily  . topiramate  75 mg Oral Daily  . warfarin  2 mg Oral q1800  . Warfarin - Physician Dosing Inpatient   Does not apply q1800   Continuous Infusions:  PRN Meds:.alum & mag hydroxide-simeth, fluticasone, metoprolol, ondansetron (ZOFRAN) IV, oxyCODONE, sodium chloride, sodium chloride, traMADol, zolpidem  Heart: irregularly irregular rhythm Lungs: Diminished Abdomen: soft, non-tender; bowel sounds normal; no masses,  no organomegaly Extremities: No lower extremity edema Wound: Clean and dry  Lab Results: CBC: Recent Labs  03/03/13 0420 03/04/13 0500  WBC 13.9* 19.1*  HGB 8.3* 9.2*  HCT 25.2* 27.2*  PLT 225 280   BMET:  Recent Labs  03/02/13 0452 03/03/13 0420  NA 141 139  K 4.6 4.5  CL 109 106  CO2 23 23  GLUCOSE 121* 106*  BUN 34* 27*  CREATININE 1.18 1.08  CALCIUM 8.4 8.0*    PT/INR:  Recent Labs  03/04/13 0555  LABPROT 19.5*  INR 1.70*   Radiology: Ct Head Wo Contrast  03/02/2013   CLINICAL DATA:  Slurred speech and facial droop.  Rule out CVA  EXAM: CT HEAD WITHOUT CONTRAST  TECHNIQUE: Contiguous axial images were obtained from the base of the skull through the vertex without intravenous contrast.  COMPARISON:  MRI 12/12/2012  FINDINGS: Mild atrophy. Negative for acute infarct, hemorrhage, or mass lesion. Hypodensity right occipital white matter consistent with chronic ischemia. No mass effect or midline shift. Small chronic infarcts cerebellum bilaterally.   Calvarium is intact.  IMPRESSION: No acute abnormality.   Electronically Signed   By: Franchot Gallo M.D.   On: 03/02/2013 09:24   Dg Chest Port 1 View  03/04/2013   CLINICAL DATA:  Pulmonary infiltrates.  EXAM: PORTABLE CHEST - 1 VIEW  COMPARISON:  03/03/2013 .  FINDINGS: Left PICC line in stable position. Persistent bilateral upper lobe and left lower lobe infiltrates are present. These appear increased in prominence from prior exam. Prior CABG. Cardiomegaly with pulmonary venous congestion and diffuse interstitial prominence with left pleural effusion noted. These findings are consistent with congestive heart failure. These findings are new. No pneumothorax or acute osseous abnormality.  IMPRESSION: 1. Stable PICC line position. 2. Persistent bilateral upper lobe and left lower lobe pulmonary infiltrates which increased slightly from prior exam. 3. Prior CABG. New onset congestive heart failure with pulmonary interstitial edema and left-sided pleural effusion.   Electronically Signed   By: Marcello Moores  Register   On: 03/04/2013 07:31   Dg Chest Port 1 View  03/03/2013   CLINICAL DATA:  CABG.  EXAM: PORTABLE CHEST - 1 VIEW  COMPARISON:  03/02/2013.  FINDINGS: Central line noted in stable position. PICC line noted in stable position. Stable cardiomegaly. Prior CABG . Persistent bilateral upper lobe and left lower lobe infiltrates are present. Left-sided pleural effusion is present. No pneumothorax.  IMPRESSION: Persistent bilateral upper lobe and left lower lobe infiltrates.   Electronically Signed   By: Marcello Moores  Register   On: 03/03/2013 08:39     Assessment/Plan: S/P Procedure(s) (LRB): CORONARY ARTERY BYPASS GRAFTING (CABG) (N/A) AORTIC VALVE REPLACEMENT (AVR) (N/A) INTRAOPERATIVE TRANSESOPHAGEAL ECHOCARDIOGRAM (N/A)  1.CV-Chronic a fib with HR into 120's after ambulating. On Lopressor 25 bid, Cardizem CD 240 daily, and Hydralazine 25 tid.Also, on Coumadin. INR increased from 1.26 to 1.7. Low dose  Coumadin tonight 2.Pulmonary-ARDS.On 2 liters of oxygen via [place].CXR shows patient rotated to the right, pulmonary vascular congestion, cardiomegaly, persistent bilateral upper lobe and LLL infiltrates. Continue Symbicort. 3.Volume Overload-On Lasix 40 daily 4.ABL anemia-H and H up to 9.2 and 27.2. Transfused 2 days ago. Continue Trinsicon. 5.ID-On Tamiflu for inluenza. Will stop as day 6.Ceftazidine to be finished after 10 total days. 6.WBC increased from 13.9 to 19.1 and with low grade fever. No signs of wound infection. Likely related to pulmonary and on antibiotics. 7.Continue with PT 8.Per Dr. Prescott Gum, to remain in ICU for today      Nani Skillern PA-C 03/04/2013 9:13 AM

## 2013-03-04 NOTE — Progress Notes (Signed)
Patient ID: Robert Richard, male   DOB: 1930/10/23, 80 y.o.   MRN: [id number] EVENING ROUNDS NOTE :     [address]       [place] [address]             [phone]                 12 Days Post-Op Procedure(s) (LRB): CORONARY ARTERY BYPASS GRAFTING (CABG) (N/A) AORTIC VALVE REPLACEMENT (AVR) (N/A) INTRAOPERATIVE TRANSESOPHAGEAL ECHOCARDIOGRAM (N/A)  Total Length of Stay:  LOS: 12 days  BP 129/80  Pulse 112  Temp(Src) 100 F (37.8 C) (Oral)  Resp 29  Ht 6\' 1"  (1.854 m)  Wt 151 lb 10.8 oz (68.8 kg)  BMI 20.02 kg/m2  SpO2 95%  .Intake/Output     01/13 0701 - 01/14 0700 01/14 0701 - 01/15 0700   P.O. 480 580   I.V. (mL/kg)     Blood     IV Piggyback 150 50   Total Intake(mL/kg) 630 (9.2) 630 (9.2)   Urine (mL/kg/hr) 910 (0.6) 1150 (1.4)   Total Output 910 1150   Net -280 -520        Urine Occurrence 3 x 1 x         Lab Results  Component Value Date   WBC 19.1* 03/04/2013   HGB 9.2* 03/04/2013   HCT 27.2* 03/04/2013   PLT 280 03/04/2013   GLUCOSE 106* 03/03/2013   CHOL 130 01/11/2012   TRIG 67.0 01/11/2012   HDL 44.60 01/11/2012   LDLCALC 72 01/11/2012   ALT 44 03/03/2013   AST 24 03/03/2013   NA 139 03/03/2013   K 4.5 03/03/2013   CL 106 03/03/2013   CREATININE 1.08 03/03/2013   BUN 27* 03/03/2013   CO2 23 03/03/2013   TSH 2.547 09/15/2012   INR 1.70* 03/04/2013   HGBA1C 6.3* 02/10/2013   WBC 19,000 Finished tamflue Temp 990-100 Walked today but weak   Grace Isaac MD  Beeper [phone] Office [phone] 03/04/2013 6:53 PM

## 2013-03-04 NOTE — Progress Notes (Signed)
Rehab Admissions Coordinator Note:  Patient was screened by Cleatrice Burke for appropriateness for an Inpatient Acute Rehab Consult.  At this time, we are recommending [facility]. [facility] will not approve admission for this diagnosis.  Cleatrice Burke 03/04/2013, 12:07 PM  I can be reached at [phone].

## 2013-03-05 ENCOUNTER — Inpatient Hospital Stay (HOSPITAL_COMMUNITY): Payer: Medicare Other

## 2013-03-05 LAB — CBC
HCT: 25.8 % — ABNORMAL LOW (ref 39.0–52.0)
Hemoglobin: 8.6 g/dL — ABNORMAL LOW (ref 13.0–17.0)
MCH: 29.1 pg (ref 26.0–34.0)
MCHC: 33.3 g/dL (ref 30.0–36.0)
MCV: 87.2 fL (ref 78.0–100.0)
Platelets: 266 10*3/uL (ref 150–400)
RBC: 2.96 MIL/uL — ABNORMAL LOW (ref 4.22–5.81)
RDW: 15.3 % (ref 11.5–15.5)
WBC: 16.5 10*3/uL — ABNORMAL HIGH (ref 4.0–10.5)

## 2013-03-05 LAB — PROTIME-INR
INR: 1.77 — ABNORMAL HIGH (ref 0.00–1.49)
Prothrombin Time: 20.1 seconds — ABNORMAL HIGH (ref 11.6–15.2)

## 2013-03-05 LAB — BASIC METABOLIC PANEL
BUN: 23 mg/dL (ref 6–23)
CO2: 22 mEq/L (ref 19–32)
Calcium: 8.3 mg/dL — ABNORMAL LOW (ref 8.4–10.5)
Chloride: 101 mEq/L (ref 96–112)
Creatinine, Ser: 0.98 mg/dL (ref 0.50–1.35)
GFR calc Af Amer: 88 mL/min — ABNORMAL LOW (ref >90)
GFR calc non Af Amer: 76 mL/min — ABNORMAL LOW (ref >90)
Glucose, Bld: 132 mg/dL — ABNORMAL HIGH (ref 70–99)
Potassium: 4.3 mEq/L (ref 3.7–5.3)
Sodium: 135 mEq/L — ABNORMAL LOW (ref 137–147)

## 2013-03-05 LAB — GLUCOSE, CAPILLARY: Glucose-Capillary: 131 mg/dL — ABNORMAL HIGH (ref 70–99)

## 2013-03-05 MED ORDER — HYDROCODONE-ACETAMINOPHEN 5-325 MG PO TABS
1.0000 | ORAL_TABLET | Freq: Four times a day (QID) | ORAL | Status: DC | PRN
  Administered 2013-03-05 – 2013-03-07 (×2): 1 via ORAL
  Filled 2013-03-05 (×2): qty 1

## 2013-03-05 NOTE — Progress Notes (Signed)
Patient has orders for telemetry bed. Patient will go to PCTU bed.  Report called and given to 2-WEST RN, Baxter Flattery. Elmarie Shiley R

## 2013-03-05 NOTE — Progress Notes (Signed)
Patient evaluated for community based chronic disease management services with [facility] as a benefit of patient's [facility] Medicare Insurance. Spoke with patient at bedside to explain [facility] Management services.  Patient has been recommended for rehabilitation but has declined at this time.  He does think that he could benefit from [facility] but would like his wife to make the decision being his primary caregiver.  Left contact information and [facility] literature at bedside for spouse Faythe Ghee) with request for her to call me back.  Referred patient to [facility] Liaison for transition of care support.  Made Inpatient Case Manager aware that [facility] Management following. Of note, [facility] Care Management services does not replace or interfere with any services that are arranged by inpatient case management or social work.  For additional questions or referrals please contact Corliss Blacker BSN RN [facility] Liaison at [phone].

## 2013-03-05 NOTE — Progress Notes (Signed)
CARDIAC REHAB PHASE I   PRE:  Rate/Rhythm: 83 Afib  BP:  Supine:   Sitting: 106/43  Standing:    SaO2: 95 RA  MODE:  Ambulation: 300 ft   POST:  Rate/Rhythm: 99 Afib  BP:  Supine:   Sitting: 112/49  Standing:    SaO2: 97 RA 1510-1555 Assisted X 1 and used walker to ambulate. Pt weak, needs assistance with getting out of recliner. Pt weak and tires easily.He leans forward with walking and needs reminders to keep back straight. VS stable. Pt back to recliner after walk. Condom catheter leaking, linens changed. Call light in reach and wife present. Reported to RN about catheter leaking.  Rodney Langton RN 03/05/2013 3:57 PM

## 2013-03-05 NOTE — Progress Notes (Signed)
Clinical Social Work Department BRIEF PSYCHOSOCIAL ASSESSMENT 03/05/2013  Patient:  Robert Richard, Robert Richard     Account Number:  [id number]     Admit date:  02/20/2013  Clinical Social Worker:  Megan Salon  Date/Time:  03/05/2013 03:59 PM  Referred by:  RN  Date Referred:  03/05/2013 Referred for  SNF Placement   Other Referral:   Interview type:  Other - See comment Other interview type:   CSW spoke with patient and patient's wife by bedside    PSYCHOSOCIAL DATA Living Status:  WIFE Admitted from facility:   Level of care:   Primary support name:  Reed Breech Primary support relationship to patient:  SPOUSE Degree of support available:   Good    CURRENT CONCERNS Current Concerns  Post-Acute Placement   Other Concerns:    SOCIAL WORK ASSESSMENT / PLAN Per PT, patient needing CIR. CSW reviewed patient's chart and noticed CIR recommended SNF placement for patient. CSW went into room and introduced self and explained reason for visit. Patient's wife was by bedside and was very pleasant. CSW explained SNF process to patient and patient's wife. Patient's wife stated that they want to take patient home instead of SNF. Patient's wife also explained that they have support at home, she is home all day and they have children who will help. CSW explained that she will have case manager follow up with patient and patient's family for home health pt.Patient's wife thanked Education officer, museum for assistance. CSW signing off at this time. Please re consult if social work needs arise.   Assessment/plan status:  No Further Intervention Required Other assessment/ plan:   Information/referral to community resources:   SNF information    PATIENT'S/FAMILY'S RESPONSE TO PLAN OF CARE: Patient and patient's wife want to take patient home with home health. Patient's wife states that she can take care of patient at home with the help of family.        Jeanette Caprice, MSW, [place]

## 2013-03-05 NOTE — Progress Notes (Signed)
13 Days Post-Op Procedure(s) (LRB): CORONARY ARTERY BYPASS GRAFTING (CABG) (N/A) AORTIC VALVE REPLACEMENT (AVR) (N/A) INTRAOPERATIVE TRANSESOPHAGEAL ECHOCARDIOGRAM (N/A) Subjective: resp status better- influenza pneumonitis Eating more Controlled afib  Objective: Vital signs in last 24 hours: Temp:  [98 F (36.7 C)-100 F (37.8 C)] 98 F (36.7 C) (01/15 0751) Pulse Rate:  [68-112] 89 (01/15 0700) Cardiac Rhythm:  [-] Atrial fibrillation (01/14 2000) Resp:  [18-37] 22 (01/15 0700) BP: (95-141)/(47-89) 124/89 mmHg (01/15 0700) SpO2:  [92 %-99 %] 97 % (01/15 0811) Weight:  [150 lb 5.7 oz (68.2 kg)] 150 lb 5.7 oz (68.2 kg) (01/15 0600)  Hemodynamic parameters for last 24 hours:  afebrile  Intake/Output from previous day: 01/14 0701 - 01/15 0700 In: 1230 [P.O.:1080; IV Piggyback:150] Out: 2025 [Urine:2025] Intake/Output this shift:    Exam Lungs clear abd soft Lab Results:  Recent Labs  03/04/13 0500 03/05/13 0500  WBC 19.1* 16.5*  HGB 9.2* 8.6*  HCT 27.2* 25.8*  PLT 280 266   BMET:  Recent Labs  03/03/13 0420 03/05/13 0500  NA 139 135*  K 4.5 4.3  CL 106 101  CO2 23 22  GLUCOSE 106* 132*  BUN 27* 23  CREATININE 1.08 0.98  CALCIUM 8.0* 8.3*    PT/INR:  Recent Labs  03/05/13 0500  LABPROT 20.1*  INR 1.77*   ABG    Component Value Date/Time   PHART 7.450 02/26/2013 1620   HCO3 19.4* 02/26/2013 1620   TCO2 20 02/26/2013 1620   ACIDBASEDEF 4.0* 02/26/2013 1620   O2SAT 84.0 02/26/2013 1620   CBG (last 3)  No results found for this basename: GLUCAP,  in the last 72 hours  Assessment/Plan: S/P Procedure(s) (LRB): CORONARY ARTERY BYPASS GRAFTING (CABG) (N/A) AORTIC VALVE REPLACEMENT (AVR) (N/A) INTRAOPERATIVE TRANSESOPHAGEAL ECHOCARDIOGRAM (N/A) Plan for transfer to step-down: see transfer orders Cont coumadin for afib  LOS: 13 days    [name],PETER 03/05/2013

## 2013-03-06 ENCOUNTER — Inpatient Hospital Stay (HOSPITAL_COMMUNITY): Payer: Medicare Other

## 2013-03-06 LAB — CBC
HCT: 25.9 % — ABNORMAL LOW (ref 39.0–52.0)
Hemoglobin: 8.6 g/dL — ABNORMAL LOW (ref 13.0–17.0)
MCH: 29 pg (ref 26.0–34.0)
MCHC: 33.2 g/dL (ref 30.0–36.0)
MCV: 87.2 fL (ref 78.0–100.0)
Platelets: 323 10*3/uL (ref 150–400)
RBC: 2.97 MIL/uL — ABNORMAL LOW (ref 4.22–5.81)
RDW: 15.3 % (ref 11.5–15.5)
WBC: 13.2 10*3/uL — ABNORMAL HIGH (ref 4.0–10.5)

## 2013-03-06 LAB — BASIC METABOLIC PANEL
BUN: 23 mg/dL (ref 6–23)
CO2: 21 mEq/L (ref 19–32)
Calcium: 8.3 mg/dL — ABNORMAL LOW (ref 8.4–10.5)
Chloride: 102 mEq/L (ref 96–112)
Creatinine, Ser: 1.05 mg/dL (ref 0.50–1.35)
GFR calc Af Amer: 75 mL/min — ABNORMAL LOW (ref >90)
GFR calc non Af Amer: 65 mL/min — ABNORMAL LOW (ref >90)
Glucose, Bld: 113 mg/dL — ABNORMAL HIGH (ref 70–99)
Potassium: 4.3 mEq/L (ref 3.7–5.3)
Sodium: 135 mEq/L — ABNORMAL LOW (ref 137–147)

## 2013-03-06 LAB — TYPE AND SCREEN
ABO/RH(D): O POS
Antibody Screen: NEGATIVE
Unit division: 0
Unit division: 0

## 2013-03-06 LAB — PROTIME-INR
INR: 1.93 — ABNORMAL HIGH (ref 0.00–1.49)
Prothrombin Time: 21.5 seconds — ABNORMAL HIGH (ref 11.6–15.2)

## 2013-03-06 MED ORDER — PHENOL 1.4 % MT LIQD
1.0000 | OROMUCOSAL | Status: DC | PRN
  Filled 2013-03-06: qty 177

## 2013-03-06 MED ORDER — ALTEPLASE 2 MG IJ SOLR
2.0000 mg | Freq: Once | INTRAMUSCULAR | Status: AC
  Administered 2013-03-06: 2 mg
  Filled 2013-03-06: qty 2

## 2013-03-06 NOTE — Progress Notes (Addendum)
      [address]       [place] [address]             [phone]      14 Days Post-Op Procedure(s) (LRB): CORONARY ARTERY BYPASS GRAFTING (CABG) (N/A) AORTIC VALVE REPLACEMENT (AVR) (N/A) INTRAOPERATIVE TRANSESOPHAGEAL ECHOCARDIOGRAM (N/A)  Subjective:  Robert Richard complains of pain in his mouth. He is using magic mouthwash with some relief.  + ambulation with assist + BM  Objective: Vital signs in last 24 hours: Temp:  [97.6 F (36.4 C)-99.1 F (37.3 C)] 97.6 F (36.4 C) (01/16 0556) Pulse Rate:  [79-92] 92 (01/16 1205) Cardiac Rhythm:  [-] Atrial fibrillation (01/16 0755) Resp:  [16-20] 20 (01/16 0556) BP: (99-136)/(45-86) 136/86 mmHg (01/16 1205) SpO2:  [93 %-99 %] 94 % (01/16 0556) Weight:  [146 lb 6.2 oz (66.4 kg)] 146 lb 6.2 oz (66.4 kg) (01/16 0556)  Intake/Output from previous day: 01/15 0701 - 01/16 0700 In: 680 [P.O.:680] Out: 1100 [Urine:1100] Intake/Output this shift: Total I/O In: 240 [P.O.:240] Out: -   General appearance: alert, cooperative and no distress Heart: irregularly irregular rhythm Lungs: clear to auscultation bilaterally Abdomen: soft, non-tender; bowel sounds normal; no masses,  no organomegaly Extremities: edema none appreciated Wound: clean and dry  Lab Results:  Recent Labs  03/05/13 0500 03/06/13 0458  WBC 16.5* 13.2*  HGB 8.6* 8.6*  HCT 25.8* 25.9*  PLT 266 323   BMET:  Recent Labs  03/05/13 0500 03/06/13 0458  NA 135* 135*  K 4.3 4.3  CL 101 102  CO2 22 21  GLUCOSE 132* 113*  BUN 23 23  CREATININE 0.98 1.05  CALCIUM 8.3* 8.3*    PT/INR:  Recent Labs  03/06/13 0458  LABPROT 21.5*  INR 1.93*   ABG    Component Value Date/Time   PHART 7.450 02/26/2013 1620   HCO3 19.4* 02/26/2013 1620   TCO2 20 02/26/2013 1620   ACIDBASEDEF 4.0* 02/26/2013 1620   O2SAT 84.0 02/26/2013 1620   CBG (last 3)   Recent Labs  03/05/13 0747  GLUCAP 131*    Assessment/Plan: S/P Procedure(s) (LRB): CORONARY  ARTERY BYPASS GRAFTING (CABG) (N/A) AORTIC VALVE REPLACEMENT (AVR) (N/A) INTRAOPERATIVE TRANSESOPHAGEAL ECHOCARDIOGRAM (N/A)  1. CV- Atrial Fibrillation, rate controlled- continue Cardizem, Lopressor, Hydralazine 2. INR 1.93- received 1 mg last night, will continue for now, if INR drops will increase dosage to 2 mg daily 3. Pulm- Influenza pneumonitis- off oxygen, CXR shows improvement of atelectasis 4. Renal- creatinine stable, weight is below baseline on Lasix 5. ABL Anemia- H/H stable at 8.6 6. Deconditioning- patient needs SNF at discharge, however patient and family decline offer and family to provide care at discharge 7. Dispo- patient stable, continue current care, per patient home possibly Monday   LOS: 14 days    Robert Richard, Robert Richard 03/06/2013  DC EPW's [facility] services ordered Resume coumadin 1mg  for controlled A fib   home Mon

## 2013-03-06 NOTE — Progress Notes (Signed)
CARDIAC REHAB PHASE I   PRE:  Rate/Rhythm: 99afib  BP:  Supine:   Sitting: 109/65  Standing:    SaO2: 95%RA  MODE:  Ambulation: 300 ft   POST:  Rate/Rhythm: 133 afib  BP:  Supine:   Sitting: 130/67  Standing:    SaO2: 97%RA 1002-1038 Pt walked 300 ft on RA with gait belt use, rolling walker and asst x 1. Tired by end of walk and did not feel he could increase distance. Heart rate to 133. To recliner with call bell after walk. Wife in room. PT to see in pm.   Graylon Good, RN BSN  03/06/2013 10:34 AM

## 2013-03-06 NOTE — Progress Notes (Signed)
Physical Therapy Treatment Patient Details Name: Robert Richard MRN: [id number] DOB: 13-Sep-1930 Today's Date: 03/06/2013 Time: 2671-2458  (14:03 - 14:13 as well for total of 24 min.) PT Time Calculation (min): 14 min + 10 min = 24 min  PT Assessment / Plan / Recommendation  History of Present Illness Pt adm for CABG x3 and AVR.Pt developed flu and transferred back to ICU for respiratory distress. PMHx LLE thrombectomy; afib; recent decr exercise tolerance due to CAD, aortic stenosis, COPD   PT Comments   Patient making slow gains with PT.  Continues to require mod assist to move to standing.  Per chart, refusing SNF.  Therefore recommend HHPT for continued therapy at discharge.  Follow Up Recommendations  Home health PT;Supervision/Assistance - 24 hour     Does the patient have the potential to tolerate intense rehabilitation     Barriers to Discharge        Equipment Recommendations  Rolling walker with 5" wheels;3in1 (PT)    Recommendations for Other Services    Frequency Min 3X/week   Progress towards PT Goals Progress towards PT goals: Progressing toward goals  Plan Discharge plan needs to be updated (Patient/family declining SNF)    Precautions / Restrictions Precautions Precautions: Sternal;Fall Precaution Comments: Patient understanding sternal precautions, using pillow appropriately for sit <> stand. Restrictions Weight Bearing Restrictions: No Other Position/Activity Restrictions: Sternal precautions   Pertinent Vitals/Pain     Mobility  Transfers Overall transfer level: Needs assistance Equipment used: Rolling walker (2 wheeled) Transfers: Sit to/from Stand Sit to Stand: Mod assist General transfer comment: Verbal cues to scoot to edge of chair before standing.  Patient required mod assist to do this.  Mod assist also needed to rise to standing without use of UE's - patient hugs pillow. Ambulation/Gait Ambulation/Gait assistance: Min guard Ambulation Distance  (Feet): 250 Feet (20' to bathroom.  Later, amb 230' ) Assistive device: Rolling walker (2 wheeled) Gait Pattern/deviations: Step-through pattern;Decreased step length - right;Decreased step length - left;Trunk flexed Gait velocity: Slow gait speed. General Gait Details: Verbal cues to stand upright and take longer steps.  Cues to stay close to RW for safety.  On initial attempt, patient needed to stop in bathroom (and needed time).  Returned at later time for additional ambulation in hallway.      PT Goals (current goals can now be found in the care plan section)    Visit Information  Last PT Received On: 03/06/13 Assistance Needed: +1 History of Present Illness: Pt adm for CABG x3 and AVR.Pt developed flu and transferred back to ICU for respiratory distress. PMHx LLE thrombectomy; afib; recent decr exercise tolerance due to CAD, aortic stenosis, COPD    Subjective Data  Subjective: "I need to walk and eat to be able to go home"   Cognition  Cognition Arousal/Alertness: Awake/alert Behavior During Therapy: WFL for tasks assessed/performed Overall Cognitive Status: Within Functional Limits for tasks assessed    Balance  Balance Overall balance assessment: Needs assistance Standing balance support: Bilateral upper extremity supported Standing balance-Leahy Scale: Fair  End of Session PT - End of Session Equipment Utilized During Treatment: Gait belt Activity Tolerance: Patient limited by fatigue Patient left: in chair;with call bell/phone within reach;with family/visitor present Nurse Communication: Mobility status   GP     Despina Pole 03/06/2013, 5:02 PM Carita Pian. Sanjuana Kava, [place] Pager [phone]

## 2013-03-07 LAB — PROTIME-INR
INR: 2.16 — ABNORMAL HIGH (ref 0.00–1.49)
Prothrombin Time: 23.4 seconds — ABNORMAL HIGH (ref 11.6–15.2)

## 2013-03-07 MED ORDER — MAGIC MOUTHWASH W/LIDOCAINE
10.0000 mL | Freq: Three times a day (TID) | ORAL | Status: DC | PRN
  Administered 2013-03-07 – 2013-03-09 (×6): 10 mL via ORAL
  Filled 2013-03-07 (×6): qty 10

## 2013-03-07 MED ORDER — LEVALBUTEROL HCL 0.63 MG/3ML IN NEBU: 0.6300 mg | INHALATION_SOLUTION | Freq: Four times a day (QID) | RESPIRATORY_TRACT | Status: DC | PRN

## 2013-03-07 MED ORDER — NYSTATIN 100000 UNIT/ML MT SUSP
5.0000 mL | Freq: Four times a day (QID) | OROMUCOSAL | Status: DC
  Filled 2013-03-07 (×4): qty 5

## 2013-03-07 NOTE — Progress Notes (Signed)
Pt ambulated one time around the circle (150 ft) using rolling walker without complaints or shob.  His HR did increase to 140, recovered back to the upper 80's, low 90's within a few minutes of returning to bed.

## 2013-03-07 NOTE — Progress Notes (Signed)
Physical Therapy Treatment Patient Details Name: Robert Richard MRN: [id number] DOB: [birth year] Today's Date: 03/07/2013 Time: 2505-3976 PT Time Calculation (min): 13 min  PT Assessment / Plan / Recommendation  History of Present Illness Pt adm for CABG x3 and AVR.Pt developed flu and transferred back to ICU for respiratory distress. PMHx LLE thrombectomy; afib; recent decr exercise tolerance due to CAD, aortic stenosis, COPD   PT Comments   Pt still struggling with bed mobility and transfers, but states his wife will be able to help him at this level  Follow Up Recommendations  Home health PT;Supervision/Assistance - 24 hour     Does the patient have the potential to tolerate intense rehabilitation     Barriers to Discharge        Equipment Recommendations  Rolling walker with 5" wheels;3in1 (PT)    Recommendations for Other Services    Frequency Min 3X/week   Progress towards PT Goals Progress towards PT goals: Progressing toward goals  Plan Discharge plan needs to be updated (Patient/family declining SNF)    Precautions / Restrictions Precautions Precautions: Sternal;Fall Precaution Comments: Patient understanding sternal precautions, using pillow appropriately for sit <> stand. Restrictions Weight Bearing Restrictions: No Other Position/Activity Restrictions: Sternal precautions   Pertinent Vitals/Pain     Mobility  Bed Mobility Overal bed mobility: Needs Assistance Bed Mobility: Supine to Sit Supine to sit: Mod assist;HOB elevated General bed mobility comments: cues for sequencing and safety Transfers Overall transfer level: Needs assistance Equipment used: Rolling walker (2 wheeled) Transfers: Sit to/from Stand Sit to Stand: Min assist General transfer comment: min assist to come forward Ambulation/Gait Ambulation/Gait assistance: Min assist Ambulation Distance (Feet): 200 Feet Assistive device: Rolling walker (2 wheeled) Gait Pattern/deviations: Step-through  pattern;Shuffle Gait velocity: Slow gait speed. General Gait Details: Verbal cues to stand upright and take longer steps.    Stairs: Yes Stairs assistance: Min guard Stair Management: One rail Right;Step to pattern;Forwards Number of Stairs: 4    Exercises     PT Diagnosis:    PT Problem List:   PT Treatment Interventions:     PT Goals (current goals can now be found in the care plan section) Acute Rehab PT Goals PT Goal Formulation: With patient Time For Goal Achievement: 03/18/13 Potential to Achieve Goals: Good  Visit Information  Last PT Received On: 03/07/13 Assistance Needed: +1 History of Present Illness: Pt adm for CABG x3 and AVR.Pt developed flu and transferred back to ICU for respiratory distress. PMHx LLE thrombectomy; afib; recent decr exercise tolerance due to CAD, aortic stenosis, COPD    Subjective Data  Subjective: "I need to walk and eat to be able to go home"   Cognition  Cognition Arousal/Alertness: Awake/alert Behavior During Therapy: WFL for tasks assessed/performed Overall Cognitive Status: Within Functional Limits for tasks assessed    Balance  Balance Overall balance assessment: Needs assistance Standing balance support: Bilateral upper extremity supported  End of Session PT - End of Session Equipment Utilized During Treatment: Gait belt Activity Tolerance: Patient limited by fatigue Patient left: in chair;with call bell/phone within reach Nurse Communication: Mobility status   GP     Paras Kreider, Tessie Fass 03/07/2013, 2:59 PM 03/07/2013  Donnella Sham, [place] [phone]  (pager)

## 2013-03-07 NOTE — Progress Notes (Addendum)
      [address]       [place] [address]             [phone]      15 Days Post-Op Procedure(s) (LRB): CORONARY ARTERY BYPASS GRAFTING (CABG) (N/A) AORTIC VALVE REPLACEMENT (AVR) (N/A) INTRAOPERATIVE TRANSESOPHAGEAL ECHOCARDIOGRAM (N/A)  Subjective:  Robert Richard continues to complain of mouth pain. He states magic mouthwash is not helping very much.  He is ambulating + BM  Objective: Vital signs in last 24 hours: Temp:  [97.7 F (36.5 C)-98.8 F (37.1 C)] 97.7 F (36.5 C) (01/17 0453) Pulse Rate:  [81-92] 83 (01/17 0453) Cardiac Rhythm:  [-] Atrial fibrillation (01/16 2030) Resp:  [17-18] 17 (01/17 0453) BP: (121-142)/(62-86) 142/73 mmHg (01/17 0453) SpO2:  [93 %-96 %] 93 % (01/17 0453) Weight:  [147 lb 11.3 oz (67 kg)] 147 lb 11.3 oz (67 kg) (01/17 0453)  Intake/Output from previous day: 01/16 0701 - 01/17 0700 In: 264 [P.O.:264] Out: 800 [Urine:800]  General appearance: alert, cooperative and no distress Heart: irregularly irregular rhythm Lungs: clear to auscultation bilaterally Abdomen: soft, non-tender; bowel sounds normal; no masses,  no organomegaly Extremities: edema none appreciated Wound: clean and dry  Lab Results:  Recent Labs  03/05/13 0500 03/06/13 0458  WBC 16.5* 13.2*  HGB 8.6* 8.6*  HCT 25.8* 25.9*  PLT 266 323   BMET:  Recent Labs  03/05/13 0500 03/06/13 0458  NA 135* 135*  K 4.3 4.3  CL 101 102  CO2 22 21  GLUCOSE 132* 113*  BUN 23 23  CREATININE 0.98 1.05  CALCIUM 8.3* 8.3*    PT/INR:  Recent Labs  03/07/13 0500  LABPROT 23.4*  INR 2.16*   ABG    Component Value Date/Time   PHART 7.450 02/26/2013 1620   HCO3 19.4* 02/26/2013 1620   TCO2 20 02/26/2013 1620   ACIDBASEDEF 4.0* 02/26/2013 1620   O2SAT 84.0 02/26/2013 1620   CBG (last 3)   Recent Labs  03/05/13 0747  GLUCAP 131*    Assessment/Plan: S/P Procedure(s) (LRB): CORONARY ARTERY BYPASS GRAFTING (CABG) (N/A) AORTIC VALVE REPLACEMENT (AVR)  (N/A) INTRAOPERATIVE TRANSESOPHAGEAL ECHOCARDIOGRAM (N/A)  1. CV- rate controlled Atrial Fibrillation- continue Cardizem, Lopressor, and Hydralazine 2. INR 2.16- will continue coumadin at 1 mg daily 3. Renal- weight is below baseline, no LE edema on Lasix 4. Pulm- no acute issues, off oxygen, pneumonia resolving, continue IS 5. Dispo- patient stable, will d/c EPW this morning, home Monday per PVT   LOS: 15 days    [name], [name] 03/07/2013  I have seen and examined the patient and agree with the assessment and plan as outlined.  [name],[name] 03/07/2013 11:46 AM

## 2013-03-07 NOTE — Progress Notes (Signed)
03/07/2013 1050 Nursing note EPW d/c per orders and per protocol. Ends intact. Pt tolerated well. Vital signs collected per protocol. Pt. Advised of bedrest for one hour post removal. Call bell within reach. INR from this am 2.16 and PA aware. Will continue to monitor patient.  [name], Arville Lime

## 2013-03-08 LAB — PROTIME-INR
INR: 2.38 — ABNORMAL HIGH (ref 0.00–1.49)
Prothrombin Time: 25.2 seconds — ABNORMAL HIGH (ref 11.6–15.2)

## 2013-03-08 MED ORDER — ZOLPIDEM TARTRATE 5 MG PO TABS
5.0000 mg | ORAL_TABLET | Freq: Every evening | ORAL | Status: DC | PRN
  Administered 2013-03-08: 5 mg via ORAL
  Filled 2013-03-08: qty 1

## 2013-03-08 MED ORDER — HYDRALAZINE HCL 10 MG PO TABS
10.0000 mg | ORAL_TABLET | Freq: Three times a day (TID) | ORAL | Status: DC
  Administered 2013-03-08 – 2013-03-09 (×3): 10 mg via ORAL
  Filled 2013-03-08 (×6): qty 1

## 2013-03-08 MED ORDER — DIPHENHYDRAMINE HCL 25 MG PO CAPS: 25.0000 mg | ORAL_CAPSULE | Freq: Every evening | ORAL | Status: DC | PRN

## 2013-03-08 NOTE — Progress Notes (Addendum)
      [address]       [place] [address]             [phone]      16 Days Post-Op Procedure(s) (LRB): CORONARY ARTERY BYPASS GRAFTING (CABG) (N/A) AORTIC VALVE REPLACEMENT (AVR) (N/A) INTRAOPERATIVE TRANSESOPHAGEAL ECHOCARDIOGRAM (N/A)  Subjective:  [name] continues to complain of mouth pain.  He states magic mouthwash helps, but the pain interferes with his eating.  He is ambulating with FWW. + BM  Objective: Vital signs in last 24 hours: Temp:  [97.2 F (36.2 C)-97.9 F (36.6 C)] 97.9 F (36.6 C) (01/18 0449) Pulse Rate:  [70-106] 70 (01/18 0449) Cardiac Rhythm:  [-] Atrial fibrillation (01/17 2045) Resp:  [16-18] 18 (01/18 0449) BP: (99-134)/(51-74) 125/71 mmHg (01/18 0449) SpO2:  [93 %-99 %] 95 % (01/18 0449) Weight:  [140 lb 4.8 oz (63.64 kg)] 140 lb 4.8 oz (63.64 kg) (01/18 0449)  Intake/Output from previous day: 01/17 0701 - 01/18 0700 In: 360 [P.O.:360] Out: 1175 [Urine:1175]  General appearance: alert, cooperative and no distress Heart: irregularly irregular rhythm Lungs: clear to auscultation bilaterally Abdomen: soft, non-tender; bowel sounds normal; no masses,  no organomegaly Extremities: edema none appreciated Wound: clean and dry  Lab Results:  Recent Labs  03/06/13 0458  WBC 13.2*  HGB 8.6*  HCT 25.9*  PLT 323   BMET:  Recent Labs  03/06/13 0458  NA 135*  K 4.3  CL 102  CO2 21  GLUCOSE 113*  BUN 23  CREATININE 1.05  CALCIUM 8.3*    PT/INR:  Recent Labs  03/08/13 0442  LABPROT 25.2*  INR 2.38*   ABG    Component Value Date/Time   PHART 7.450 02/26/2013 1620   HCO3 19.4* 02/26/2013 1620   TCO2 20 02/26/2013 1620   ACIDBASEDEF 4.0* 02/26/2013 1620   O2SAT 84.0 02/26/2013 1620   CBG (last 3)  No results found for this basename: GLUCAP,  in the last 72 hours  Assessment/Plan: S/P Procedure(s) (LRB): CORONARY ARTERY BYPASS GRAFTING (CABG) (N/A) AORTIC VALVE REPLACEMENT (AVR) (N/A) INTRAOPERATIVE  TRANSESOPHAGEAL ECHOCARDIOGRAM (N/A)  1. CV- Rate controlled A. Fib- on Lopressor, Cardizem, some hypotensive after administration of AM meds, will decrease Hydralazine to 10 mg TID 2. INR 2.38- will continue Coumadin at 1 mg daily 3. Pulm- no acute issue, encouarged IS 4. Mouth Pain/ Sore Throat- no visible signs of thrush, no visible mouth sores seen- continue Magic mouthwash with Lidocaine 5. Renal- weight is below baseline, no LE edema- on Lasix, but can possibly d/c soon 6. Dispo- patient remains stable, continued mouth pain on Magic mouthwash, will d/c in AM if remains stable   LOS: 16 days    Robert Richard 03/08/2013  I have seen and examined the patient and agree with the assessment and plan as outlined.  Angeliah Wisdom H 03/08/2013 2:48 PM

## 2013-03-08 NOTE — Progress Notes (Signed)
03/08/2013 11:19 AM Nursing note Pt. Ambulated 200 ft with RW, NT and on RA. Pt. Tolerated well. Encouraged further ambulation today.  Shahzad Thomann, Arville Lime

## 2013-03-08 NOTE — Progress Notes (Signed)
Pt ambulated in hall 350 ft with standby assist and rolling walker.  He tolerated without shob or complaints.

## 2013-03-09 LAB — PROTIME-INR
INR: 2.55 — ABNORMAL HIGH (ref 0.00–1.49)
Prothrombin Time: 26.6 seconds — ABNORMAL HIGH (ref 11.6–15.2)

## 2013-03-09 MED ORDER — FLUCONAZOLE 200 MG PO TABS
200.0000 mg | ORAL_TABLET | Freq: Once | ORAL | Status: AC
  Administered 2013-03-09: 200 mg via ORAL
  Filled 2013-03-09: qty 1

## 2013-03-09 MED ORDER — FLUCONAZOLE 50 MG PO TABS: 50.0000 mg | ORAL_TABLET | Freq: Every day | ORAL | Status: DC

## 2013-03-09 MED ORDER — BUDESONIDE-FORMOTEROL FUMARATE 160-4.5 MCG/ACT IN AERO: 2.0000 | INHALATION_SPRAY | Freq: Two times a day (BID) | RESPIRATORY_TRACT | Status: DC

## 2013-03-09 MED ORDER — FLUCONAZOLE 50 MG PO TABS
50.0000 mg | ORAL_TABLET | Freq: Every day | ORAL | Status: DC
  Administered 2013-03-09: 50 mg via ORAL
  Filled 2013-03-09: qty 1

## 2013-03-09 MED ORDER — NYSTATIN 100000 UNIT/ML MT SUSP: 5.0000 mL | Freq: Four times a day (QID) | OROMUCOSAL | Status: DC

## 2013-03-09 MED ORDER — HYDRALAZINE HCL 10 MG PO TABS: 10.0000 mg | ORAL_TABLET | Freq: Three times a day (TID) | ORAL | Status: DC

## 2013-03-09 MED ORDER — ACYCLOVIR 400 MG PO TABS
200.0000 mg | ORAL_TABLET | Freq: Every day | ORAL | Status: DC
  Administered 2013-03-09: 200 mg via ORAL
  Filled 2013-03-09 (×5): qty 0.5

## 2013-03-09 MED ORDER — WARFARIN SODIUM 1 MG PO TABS: 1.0000 mg | ORAL_TABLET | ORAL | Status: DC

## 2013-03-09 MED ORDER — FUROSEMIDE 40 MG PO TABS: 40.0000 mg | ORAL_TABLET | Freq: Every day | ORAL | Status: DC

## 2013-03-09 MED ORDER — METOPROLOL TARTRATE 25 MG PO TABS
25.0000 mg | ORAL_TABLET | Freq: Two times a day (BID) | ORAL | Status: DC
Start: 2013-03-09 — End: 2013-07-08

## 2013-03-09 MED ORDER — TRAMADOL HCL 50 MG PO TABS: 50.0000 mg | ORAL_TABLET | Freq: Four times a day (QID) | ORAL | Status: DC | PRN

## 2013-03-09 MED ORDER — POTASSIUM CHLORIDE CRYS ER 20 MEQ PO TBCR: 20.0000 meq | EXTENDED_RELEASE_TABLET | Freq: Every day | ORAL | Status: DC

## 2013-03-09 MED ORDER — HYDROCODONE-ACETAMINOPHEN 5-325 MG PO TABS: 1.0000 | ORAL_TABLET | Freq: Four times a day (QID) | ORAL | Status: DC | PRN

## 2013-03-09 MED ORDER — DILTIAZEM HCL ER COATED BEADS 240 MG PO CP24: 240.0000 mg | ORAL_CAPSULE | Freq: Every day | ORAL | Status: DC

## 2013-03-09 NOTE — Progress Notes (Signed)
Physical Therapy Treatment Patient Details Name: Robert Richard MRN: [id number] DOB: 1930/07/02 Today's Date: 03/09/2013 Time: 1027-2536 PT Time Calculation (min): 8 min  PT Assessment / Plan / Recommendation  History of Present Illness Pt adm for CABG x3 and AVR.Pt developed flu and transferred back to ICU for respiratory distress. PMHx LLE thrombectomy; afib; recent decr exercise tolerance due to CAD, aortic stenosis, COPD   PT Comments   Pt and wife eager to go home today. Completed education with wife re: pt's mobility needs and she feels comfortable providing assist to pt. Continue strengthening and mobility training with HHPT on discharge.   Follow Up Recommendations  Home health PT;Supervision/Assistance - 24 hour     Does the patient have the potential to tolerate intense rehabilitation     Barriers to Discharge        Equipment Recommendations  Rolling walker with 5" wheels;3in1 (PT)    Recommendations for Other Services    Frequency     Progress towards PT Goals Progress towards PT goals: Progressing toward goals (pt and wife educated with HHPT To continue)  Plan Current plan remains appropriate    Precautions / Restrictions Precautions Precautions: Sternal;Fall   Pertinent Vitals/Pain no apparent distress     Mobility  Transfers General transfer comment: Pt in bed waiting for IV team to come pull his PICC line and then go home. Wife present and reported she did not feel the need to practice transfers (pt also needed to be in bed for IV team). Instructed in techniques to make it easier for pt to stand (scoot to edge of chair, pull feet back under his knees, rocking for momentum).  Ambulation/Gait General stair comments: Pt/wife felt they did not need to review steps. Instructed in either forwards with one rail and wife on his other side or sideways facing the rail with both hands on railing. Wife and pt appreciative of this idea and feel it will work well.     Exercises     PT Diagnosis:    PT Problem List:   PT Treatment Interventions:     PT Goals (current goals can now be found in the care plan section)    Visit Information  Last PT Received On: 03/09/13 History of Present Illness: Pt adm for CABG x3 and AVR.Pt developed flu and transferred back to ICU for respiratory distress. PMHx LLE thrombectomy; afib; recent decr exercise tolerance due to CAD, aortic stenosis, COPD    Subjective Data  Subjective: Wife present and reports she has helped pt with sit to stand.   Cognition  Cognition Arousal/Alertness: Awake/alert Behavior During Therapy: WFL for tasks assessed/performed Overall Cognitive Status: Within Functional Limits for tasks assessed    Balance     End of Session PT - End of Session Activity Tolerance:  (treatment limited by need to stay in bed for IV team) Patient left: in bed;with call bell/phone within reach;with family/visitor present;with nursing/sitter in room   GP     Robert Richard 03/09/2013, 11:44 AM Pager [phone]

## 2013-03-09 NOTE — Discharge Instructions (Signed)
Coronary Artery Bypass Grafting, Care After °Refer to this sheet in the next few weeks. These instructions provide you with information on caring for yourself after your procedure. Your health care provider may also give you more specific instructions. Your treatment has been planned according to current medical practices, but problems sometimes occur. Call your health care provider if you have any problems or questions after your procedure. °WHAT TO EXPECT AFTER THE PROCEDURE °Recovery from surgery will be different for everyone. Some people feel well after 3 or 4 weeks, while for others it takes longer. After your procedure, it is typical to have the following: °· Nausea and a lack of appetite.   °· Constipation. °· Weakness and fatigue.   °· Depression or irritability.   °· Pain or discomfort at your incision site. °HOME CARE INSTRUCTIONS °· Only take over-the-counter or prescription medicines as directed by your health care provider. Take all medicines exactly as directed. Do not stop taking medicines or start any new medicines without first checking with your health care provider.   °· Take your pulse as directed by your health care provider. °· Perform deep breathing as directed by your health care provider. If you were given a device called an incentive spirometer, use it to practice deep breathing several times a day. Support your chest with a pillow or your arms when you take deep breaths or cough. °· Keep incision areas clean, dry, and protected. Remove or change any bandages (dressings) only as directed by your health care provider. You may have skin adhesive strips over the incision areas. Do not take the strips off. They will fall off on their own. °· Check incision areas daily for any swelling, redness, or drainage. °· If incisions were made in your legs, do the following: °· Avoid crossing your legs.   °· Avoid sitting for long periods of time. Change positions every 30 minutes.   °· Elevate your legs  when you are sitting.   °· Wear compression stockings as directed by your health care provider. These stockings help keep blood clots from forming in your legs. °· Take showers once your health care provider approves. Until then, only take sponge baths. Pat incisions dry. Do not rub incisions with a washcloth or towel. Do not take tub baths or go swimming until your health care provider approves. °· Eat foods that are high in fiber, such as raw fruits and vegetables, whole grains, beans, and nuts. Meats should be lean cut. Avoid canned, processed, and fried foods. °· Drink enough fluids to keep your urine clear or pale yellow. °· Weigh yourself every day. This helps identify if you are retaining fluid that may make your heart and lungs work harder.   °· Rest and limit activity as directed by your health care provider. You may be instructed to: °· Stop any activity at once if you have chest pain, shortness of breath, irregular heartbeats, or dizziness. Get help right away if you have any of these symptoms. °· Move around frequently for short periods or take short walks as directed by your health care provider. Increase your activities gradually. You may need physical therapy or cardiac rehabilitation to help strengthen your muscles and build your endurance. °· Avoid lifting, pushing, or pulling anything heavier than 10 lb (4.5 kg) for at least 6 weeks after surgery. °· Do not drive until your health care provider approves.  °· Ask your health care provider when you may return to work and resume sexual activity. °· Follow up with your health care provider as   directed.  SEEK MEDICAL CARE IF:  You have swelling, redness, increasing pain, or drainage at the site of an incision.   You develop a fever.   You have swelling in your ankles or legs.   You have pain in your legs.   You have weight gain of 2 or more pounds a day.  You are nauseous or vomit.  You have diarrhea. SEEK IMMEDIATE MEDICAL CARE  IF:  You have chest pain that goes to your jaw or arms.  You have shortness of breath.   You have a fast or irregular heartbeat.   You notice a "clicking" in your breastbone (sternum) when you move.   You have numbness or weakness in your arms or legs.  You feel dizzy or lightheaded.  MAKE SURE YOU:  Understand these instructions.  Will watch your condition.  Will get help right away if you are not doing well or get worse. Document Released: 08/25/2004 Document Revised: 10/08/2012 Document Reviewed: 07/15/2012 [facility] Patient Information 2014 [place].  Endoscopic Saphenous Vein Harvesting Care After Refer to this sheet in the next few weeks. These instructions provide you with information on caring for yourself after your procedure. Your caregiver may also give you more specific instructions. Your treatment has been planned according to current medical practices, but problems sometimes occur. Call your caregiver if you have any problems or questions after your procedure. HOME CARE INSTRUCTIONS Medicine  Take whatever pain medicine your surgeon prescribes. Follow the directions carefully. Do not take over-the-counter pain medicine unless your surgeon says it is okay. Some pain medicine can cause bleeding problems for several weeks after surgery.  Follow your surgeon's instructions about driving. You will probably not be permitted to drive after heart surgery.  Take any medicines your surgeon prescribes. Any medicines you took before your heart surgery should be checked with your caregiver before you start taking them again. Wound care  Ask your surgeon how long you should keep wearing your elastic bandage or stocking.  Check the area around your surgical cuts (incisions) whenever your bandages (dressings) are changed. Look for any redness or swelling.  You will need to return to have the stitches (sutures) or staples taken out. Ask your surgeon when to do  that.  Ask your surgeon when you can shower or bathe. Activity  Try to keep your legs raised when you are sitting.  Do any exercises your caregivers have given you. These may include deep breathing exercises, coughing, walking, or other exercises. SEEK MEDICAL CARE IF:  You have any questions about your medicines.  You have more leg pain, especially if your pain medicine stops working.  New or growing bruises develop on your leg.  Your leg swells, feels tight, or becomes red.  You have numbness in your leg. SEEK IMMEDIATE MEDICAL CARE IF:  Your pain gets much worse.  Blood or fluid leaks from any of the incisions.  Your incisions become warm, swollen, or red.  You have chest pain.  You have trouble breathing.  You have a fever.  You have more pain near your leg incision. MAKE SURE YOU:  Understand these instructions.  Will watch your condition.  Will get help right away if you are not doing well or get worse. Document Released: 10/18/2010 Document Revised: 04/30/2011 Document Reviewed: 10/18/2010 [facility] Patient Information 2014 [place], [place].  Vitamin K and Warfarin Warfarin is a drug that helps thin your blood. If you take warfarin, you will need to follow a diet that  has a consistent amount of vitamin K-containing foods. Sudden changes in the amount of vitamin K that you eat can cause the medicine to not work as well as it should. You do not need to avoid vitamin K-containing foods. FOODS HIGH IN VITAMIN K  Broccoli, fresh or frozen, cooked, 1 cup  Greens, fresh or frozen, cooked (beet, collard, mustard, turnip),  cup  Kale, fresh or frozen, cooked,  cup  Parsley, raw, 10 sprigs  Spinach, frozen or canned, cooked,  cup FOODS MODERATELY HIGH IN VITAMIN K  Bok choy, cooked, 1 cup  Broccoli, raw, 1 cup  Brussels sprouts, fresh or frozen, cooked,  cup  Cabbage, cooked, 1 cup  Endive, raw, 1 cup  Green leaf lettuce, raw, 1 cup  Green  scallions, raw,  cup  Okra, frozen, cooked, 1 cup  Romaine lettuce, raw, 1 cup  Sauerkraut, canned, 1 cup  Spinach, raw, 1 cup KEEPING YOUR INTAKE CONSISTENT  Note the foods that are high in vitamin K listed above. How many times per week do you eat these foods?  For example, you might eat cooked broccoli 1 time a week and a leafy green salad 3 times a week. In that case, you should have 1 high vitamin K food each week and 3 moderately high foods each week.  Remember, you do not need to eat the same foods each week. You do need to keep your vitamin K intake levels the same. Notify your caregiver before changing your diet. MORE TIPS  Take your warfarin as instructed.  It is okay to take a multivitamin that contains vitamin K. Just be sure to take it every day.  Discuss any supplements or whole food supplements with your pharmacist. Document Released: 12/03/2008 Document Revised: 08/07/2011 Document Reviewed: 12/03/2008 [employer] Patient Information 2014 [place], [place].

## 2013-03-09 NOTE — Progress Notes (Signed)
Chest tube sutures removed per order. Site clean, dry, and intact. Went over discharge instructions with patient and his wife. No additional questions or concerns related to discharge instructions. IV team to remove PICC line. Patient taken off cardiac monitor. Patient discharged home with wife. Elmarie Shiley R

## 2013-03-09 NOTE — Progress Notes (Signed)
2706-2376 Pt for discharge. Wife states they are getting rolling walker for home. Education completed with pt and wife. Understanding voiced. Discussed CRP 2. Wife states he has treadmill at home and not interested at this time. Told pt not to get on treadmill for 4 weeks and ex ed given. Pt knows if he changes his mind about program, to discuss with cardiologist. Gave heart healthy diet for pt to start once his mouth has healed. Right now he is limited in what he can tolerate. Knows to watch dark, green leafy vegetables while on Coumadin. Put on discharge [facility] video for them to view. Graylon Good RN BSN 03/09/2013 9:01 AM

## 2013-03-09 NOTE — Progress Notes (Signed)
      [address]       [place] [address]             [phone]      17 Days Post-Op Procedure(s) (LRB): CORONARY ARTERY BYPASS GRAFTING (CABG) (N/A) AORTIC VALVE REPLACEMENT (AVR) (N/A) INTRAOPERATIVE TRANSESOPHAGEAL ECHOCARDIOGRAM (N/A)  Subjective:  Mr. Schreiner continues to complain of mouth pain.  He states it has improved some, but is still bothersome.  He still wants to go home. + ambulation with assist  + BM  Objective: Vital signs in last 24 hours: Temp:  [97.5 F (36.4 C)-98 F (36.7 C)] 97.5 F (36.4 C) (01/19 0526) Pulse Rate:  [79-96] 79 (01/19 0526) Cardiac Rhythm:  [-] Atrial fibrillation (01/18 2023) Resp:  [18] 18 (01/19 0526) BP: (116-134)/(62-89) 134/89 mmHg (01/19 0526) SpO2:  [95 %-97 %] 95 % (01/19 0526) Weight:  [138 lb 9.6 oz (62.869 kg)] 138 lb 9.6 oz (62.869 kg) (01/19 0526)  Intake/Output from previous day: 01/18 0701 - 01/19 0700 In: -  Out: 500 [Urine:500]  General appearance: alert, cooperative and no distress Heart: irregularly irregular rhythm Lungs: clear to auscultation bilaterally Abdomen: soft, non-tender; bowel sounds normal; no masses,  no organomegaly Extremities: edema none appreciated Wound: clean and dry  Lab Results: No results found for this basename: WBC, HGB, HCT, PLT,  in the last 72 hours BMET: No results found for this basename: NA, K, CL, CO2, GLUCOSE, BUN, CREATININE, CALCIUM,  in the last 72 hours  PT/INR:  Recent Labs  03/09/13 0522  LABPROT 26.6*  INR 2.55*   ABG    Component Value Date/Time   PHART 7.450 02/26/2013 1620   HCO3 19.4* 02/26/2013 1620   TCO2 20 02/26/2013 1620   ACIDBASEDEF 4.0* 02/26/2013 1620   O2SAT 84.0 02/26/2013 1620   CBG (last 3)  No results found for this basename: GLUCAP,  in the last 72 hours  Assessment/Plan: S/P Procedure(s) (LRB): CORONARY ARTERY BYPASS GRAFTING (CABG) (N/A) AORTIC VALVE REPLACEMENT (AVR) (N/A) INTRAOPERATIVE TRANSESOPHAGEAL ECHOCARDIOGRAM  (N/A)  1. CV- A. Fib, rate controlled- on Cardizem, Lopressor, Hypotension improved, continue Hydralazine at 10 mg TID 2. INR 2.55- patient only receiving 1 mg coumadin daily, continuing to trend up will adjust dose to every other day 3. Pulm- no acute issues, continue IS 4. ENT- continued mouth pain, yellow/white plagues present on tongue and throat, which would indicate thrush, will discontinue magic mouthwash and give patient Diflucan today and start Nystatin swish and swallow 5. Renal- weight is below baseline, will taper lasix 6. Dispo- patient stable, will d/c patient home today    LOS: 17 days    [name], [name] 03/09/2013

## 2013-03-09 NOTE — Progress Notes (Signed)
Pt complains of continued mouth discomfort.  Tongue is coated white.  Wife states that his mouth had improved, but now is back to being as bad as it had been. PRN magic mouthwash with lidocaine given with some relief.

## 2013-03-13 ENCOUNTER — Encounter: Payer: Self-pay | Admitting: Pharmacist

## 2013-03-13 ENCOUNTER — Emergency Department (HOSPITAL_COMMUNITY): Payer: Medicare Other

## 2013-03-13 ENCOUNTER — Encounter (HOSPITAL_COMMUNITY): Payer: Self-pay | Admitting: Emergency Medicine

## 2013-03-13 ENCOUNTER — Other Ambulatory Visit: Payer: Self-pay

## 2013-03-13 ENCOUNTER — Inpatient Hospital Stay (HOSPITAL_COMMUNITY)
Admission: EM | Admit: 2013-03-13 | Discharge: 2013-03-17 | DRG: 871 | Disposition: A | Discharge status: Home Health | Payer: Medicare Other | Attending: Internal Medicine | Admitting: Internal Medicine

## 2013-03-13 DIAGNOSIS — R5383 Other fatigue: Secondary | ICD-10-CM

## 2013-03-13 DIAGNOSIS — J45909 Unspecified asthma, uncomplicated: Secondary | ICD-10-CM

## 2013-03-13 DIAGNOSIS — Z860100 Personal history of colon polyps, unspecified: Secondary | ICD-10-CM

## 2013-03-13 DIAGNOSIS — J309 Allergic rhinitis, unspecified: Secondary | ICD-10-CM

## 2013-03-13 DIAGNOSIS — I4891 Unspecified atrial fibrillation: Secondary | ICD-10-CM

## 2013-03-13 DIAGNOSIS — E785 Hyperlipidemia, unspecified: Secondary | ICD-10-CM

## 2013-03-13 DIAGNOSIS — J449 Chronic obstructive pulmonary disease, unspecified: Secondary | ICD-10-CM

## 2013-03-13 DIAGNOSIS — G2581 Restless legs syndrome: Secondary | ICD-10-CM

## 2013-03-13 DIAGNOSIS — Z952 Presence of prosthetic heart valve: Secondary | ICD-10-CM

## 2013-03-13 DIAGNOSIS — R55 Syncope and collapse: Secondary | ICD-10-CM

## 2013-03-13 DIAGNOSIS — Z681 Body mass index (BMI) 19 or less, adult: Secondary | ICD-10-CM

## 2013-03-13 DIAGNOSIS — R0681 Apnea, not elsewhere classified: Secondary | ICD-10-CM

## 2013-03-13 DIAGNOSIS — I10 Essential (primary) hypertension: Secondary | ICD-10-CM

## 2013-03-13 DIAGNOSIS — Z87442 Personal history of urinary calculi: Secondary | ICD-10-CM

## 2013-03-13 DIAGNOSIS — D649 Anemia, unspecified: Secondary | ICD-10-CM

## 2013-03-13 DIAGNOSIS — J4489 Other specified chronic obstructive pulmonary disease: Secondary | ICD-10-CM

## 2013-03-13 DIAGNOSIS — R1312 Dysphagia, oropharyngeal phase: Secondary | ICD-10-CM | POA: Diagnosis present

## 2013-03-13 DIAGNOSIS — E43 Unspecified severe protein-calorie malnutrition: Secondary | ICD-10-CM

## 2013-03-13 DIAGNOSIS — M19019 Primary osteoarthritis, unspecified shoulder: Secondary | ICD-10-CM | POA: Diagnosis present

## 2013-03-13 DIAGNOSIS — A419 Sepsis, unspecified organism: Principal | ICD-10-CM

## 2013-03-13 DIAGNOSIS — R0989 Other specified symptoms and signs involving the circulatory and respiratory systems: Secondary | ICD-10-CM

## 2013-03-13 DIAGNOSIS — J189 Pneumonia, unspecified organism: Secondary | ICD-10-CM

## 2013-03-13 DIAGNOSIS — M109 Gout, unspecified: Secondary | ICD-10-CM

## 2013-03-13 DIAGNOSIS — Z87891 Personal history of nicotine dependence: Secondary | ICD-10-CM

## 2013-03-13 DIAGNOSIS — M810 Age-related osteoporosis without current pathological fracture: Secondary | ICD-10-CM

## 2013-03-13 DIAGNOSIS — Z951 Presence of aortocoronary bypass graft: Secondary | ICD-10-CM

## 2013-03-13 DIAGNOSIS — I050 Rheumatic mitral stenosis: Secondary | ICD-10-CM

## 2013-03-13 DIAGNOSIS — N289 Disorder of kidney and ureter, unspecified: Secondary | ICD-10-CM

## 2013-03-13 DIAGNOSIS — B370 Candidal stomatitis: Secondary | ICD-10-CM

## 2013-03-13 DIAGNOSIS — Z85820 Personal history of malignant melanoma of skin: Secondary | ICD-10-CM

## 2013-03-13 DIAGNOSIS — K5289 Other specified noninfective gastroenteritis and colitis: Secondary | ICD-10-CM

## 2013-03-13 DIAGNOSIS — Z7901 Long term (current) use of anticoagulants: Secondary | ICD-10-CM

## 2013-03-13 DIAGNOSIS — I739 Peripheral vascular disease, unspecified: Secondary | ICD-10-CM

## 2013-03-13 DIAGNOSIS — G479 Sleep disorder, unspecified: Secondary | ICD-10-CM

## 2013-03-13 DIAGNOSIS — B9689 Other specified bacterial agents as the cause of diseases classified elsewhere: Secondary | ICD-10-CM | POA: Diagnosis present

## 2013-03-13 DIAGNOSIS — Z833 Family history of diabetes mellitus: Secondary | ICD-10-CM

## 2013-03-13 DIAGNOSIS — G9340 Encephalopathy, unspecified: Secondary | ICD-10-CM

## 2013-03-13 DIAGNOSIS — E119 Type 2 diabetes mellitus without complications: Secondary | ICD-10-CM | POA: Diagnosis present

## 2013-03-13 DIAGNOSIS — R64 Cachexia: Secondary | ICD-10-CM | POA: Diagnosis present

## 2013-03-13 DIAGNOSIS — Z9889 Other specified postprocedural states: Secondary | ICD-10-CM

## 2013-03-13 DIAGNOSIS — R7309 Other abnormal glucose: Secondary | ICD-10-CM

## 2013-03-13 DIAGNOSIS — N390 Urinary tract infection, site not specified: Secondary | ICD-10-CM

## 2013-03-13 DIAGNOSIS — G9341 Metabolic encephalopathy: Secondary | ICD-10-CM | POA: Diagnosis present

## 2013-03-13 DIAGNOSIS — Z91041 Radiographic dye allergy status: Secondary | ICD-10-CM

## 2013-03-13 DIAGNOSIS — Z7982 Long term (current) use of aspirin: Secondary | ICD-10-CM

## 2013-03-13 DIAGNOSIS — R0609 Other forms of dyspnea: Secondary | ICD-10-CM

## 2013-03-13 DIAGNOSIS — Z85828 Personal history of other malignant neoplasm of skin: Secondary | ICD-10-CM

## 2013-03-13 DIAGNOSIS — Z79899 Other long term (current) drug therapy: Secondary | ICD-10-CM

## 2013-03-13 DIAGNOSIS — Z823 Family history of stroke: Secondary | ICD-10-CM

## 2013-03-13 DIAGNOSIS — I2510 Atherosclerotic heart disease of native coronary artery without angina pectoris: Secondary | ICD-10-CM

## 2013-03-13 DIAGNOSIS — R5381 Other malaise: Secondary | ICD-10-CM

## 2013-03-13 DIAGNOSIS — Z8249 Family history of ischemic heart disease and other diseases of the circulatory system: Secondary | ICD-10-CM

## 2013-03-13 LAB — CBC WITH DIFFERENTIAL/PLATELET
Basophils Absolute: 0 10*3/uL (ref 0.0–0.1)
Basophils Relative: 0 % (ref 0–1)
Eosinophils Absolute: 0 10*3/uL (ref 0.0–0.7)
Eosinophils Relative: 0 % (ref 0–5)
HCT: 30.2 % — ABNORMAL LOW (ref 39.0–52.0)
Hemoglobin: 10.3 g/dL — ABNORMAL LOW (ref 13.0–17.0)
Lymphocytes Relative: 5 % — ABNORMAL LOW (ref 12–46)
Lymphs Abs: 1.1 10*3/uL (ref 0.7–4.0)
MCH: 29.3 pg (ref 26.0–34.0)
MCHC: 34.1 g/dL (ref 30.0–36.0)
MCV: 85.8 fL (ref 78.0–100.0)
Monocytes Absolute: 1.3 10*3/uL — ABNORMAL HIGH (ref 0.1–1.0)
Monocytes Relative: 6 % (ref 3–12)
Neutro Abs: 18.8 10*3/uL — ABNORMAL HIGH (ref 1.7–7.7)
Neutrophils Relative %: 89 % — ABNORMAL HIGH (ref 43–77)
Platelets: 377 10*3/uL (ref 150–400)
RBC: 3.52 MIL/uL — ABNORMAL LOW (ref 4.22–5.81)
RDW: 14.1 % (ref 11.5–15.5)
WBC: 21.2 10*3/uL — ABNORMAL HIGH (ref 4.0–10.5)

## 2013-03-13 LAB — URINALYSIS, ROUTINE W REFLEX MICROSCOPIC
Bilirubin Urine: NEGATIVE
Glucose, UA: NEGATIVE mg/dL
Hgb urine dipstick: NEGATIVE
Ketones, ur: NEGATIVE mg/dL
Nitrite: NEGATIVE
Protein, ur: NEGATIVE mg/dL
Specific Gravity, Urine: 1.02 (ref 1.005–1.030)
Urobilinogen, UA: 2 mg/dL — ABNORMAL HIGH (ref 0.0–1.0)
pH: 7.5 (ref 5.0–8.0)

## 2013-03-13 LAB — COMPREHENSIVE METABOLIC PANEL
ALT: 44 U/L (ref 0–53)
AST: 28 U/L (ref 0–37)
Albumin: 2.7 g/dL — ABNORMAL LOW (ref 3.5–5.2)
Alkaline Phosphatase: 160 U/L — ABNORMAL HIGH (ref 39–117)
BUN: 29 mg/dL — ABNORMAL HIGH (ref 6–23)
CO2: 20 mEq/L (ref 19–32)
Calcium: 8.6 mg/dL (ref 8.4–10.5)
Chloride: 96 mEq/L (ref 96–112)
Creatinine, Ser: 1.32 mg/dL (ref 0.50–1.35)
GFR calc Af Amer: 57 mL/min — ABNORMAL LOW (ref >90)
GFR calc non Af Amer: 49 mL/min — ABNORMAL LOW (ref >90)
Glucose, Bld: 131 mg/dL — ABNORMAL HIGH (ref 70–99)
Potassium: 4.6 mEq/L (ref 3.7–5.3)
Sodium: 132 mEq/L — ABNORMAL LOW (ref 137–147)
Total Bilirubin: 0.5 mg/dL (ref 0.3–1.2)
Total Protein: 8.2 g/dL (ref 6.0–8.3)

## 2013-03-13 LAB — PROTIME-INR
INR: 1.67 — ABNORMAL HIGH (ref 0.00–1.49)
INR: 2.08 — ABNORMAL HIGH (ref 0.00–1.49)
Prothrombin Time: 19.2 seconds — ABNORMAL HIGH (ref 11.6–15.2)
Prothrombin Time: 22.7 seconds — ABNORMAL HIGH (ref 11.6–15.2)

## 2013-03-13 LAB — URINE MICROSCOPIC-ADD ON

## 2013-03-13 LAB — TROPONIN I: Troponin I: <0.3 ng/mL (ref <0.30)

## 2013-03-13 MED ORDER — PIPERACILLIN-TAZOBACTAM 3.375 G IVPB
3.3750 g | Freq: Three times a day (TID) | INTRAVENOUS | Status: DC
  Administered 2013-03-14 – 2013-03-17 (×11): 3.375 g via INTRAVENOUS
  Filled 2013-03-13 (×13): qty 50

## 2013-03-13 MED ORDER — BUDESONIDE-FORMOTEROL FUMARATE 160-4.5 MCG/ACT IN AERO
2.0000 | INHALATION_SPRAY | Freq: Two times a day (BID) | RESPIRATORY_TRACT | Status: DC
  Administered 2013-03-14 – 2013-03-17 (×8): 2 via RESPIRATORY_TRACT
  Filled 2013-03-13: qty 6

## 2013-03-13 MED ORDER — SODIUM CHLORIDE 0.9 % IV SOLN
INTRAVENOUS | Status: AC
  Administered 2013-03-14: via INTRAVENOUS

## 2013-03-13 MED ORDER — ACETAMINOPHEN 325 MG PO TABS
650.0000 mg | ORAL_TABLET | Freq: Four times a day (QID) | ORAL | Status: DC | PRN
  Administered 2013-03-14 (×2): 650 mg via ORAL
  Filled 2013-03-13 (×2): qty 2

## 2013-03-13 MED ORDER — PIPERACILLIN-TAZOBACTAM 3.375 G IVPB 30 MIN
3.3750 g | Freq: Once | INTRAVENOUS | Status: AC
Start: 2013-03-13 — End: 2013-03-13
  Administered 2013-03-13: 3.375 g via INTRAVENOUS
  Filled 2013-03-13: qty 50

## 2013-03-13 MED ORDER — BUSPIRONE HCL 15 MG PO TABS
7.5000 mg | ORAL_TABLET | Freq: Two times a day (BID) | ORAL | Status: DC
  Administered 2013-03-14 – 2013-03-17 (×8): 7.5 mg via ORAL
  Filled 2013-03-13 (×10): qty 1

## 2013-03-13 MED ORDER — ADULT MULTIVITAMIN W/MINERALS CH
1.0000 | ORAL_TABLET | Freq: Every day | ORAL | Status: DC
  Administered 2013-03-14 – 2013-03-17 (×4): 1 via ORAL
  Filled 2013-03-13 (×4): qty 1

## 2013-03-13 MED ORDER — NYSTATIN 100000 UNIT/ML MT SUSP
5.0000 mL | Freq: Four times a day (QID) | OROMUCOSAL | Status: DC
  Administered 2013-03-14 – 2013-03-17 (×14): 500000 [IU] via ORAL
  Filled 2013-03-13 (×17): qty 5

## 2013-03-13 MED ORDER — WARFARIN SODIUM 2 MG PO TABS
2.0000 mg | ORAL_TABLET | Freq: Once | ORAL | Status: DC
  Filled 2013-03-13: qty 1

## 2013-03-13 MED ORDER — TOPIRAMATE 25 MG PO TABS
75.0000 mg | ORAL_TABLET | Freq: Every day | ORAL | Status: DC
  Administered 2013-03-14 – 2013-03-17 (×4): 75 mg via ORAL
  Filled 2013-03-13 (×4): qty 3

## 2013-03-13 MED ORDER — ACETAMINOPHEN 650 MG RE SUPP: 650.0000 mg | Freq: Four times a day (QID) | RECTAL | Status: DC | PRN

## 2013-03-13 MED ORDER — ALBUTEROL SULFATE (2.5 MG/3ML) 0.083% IN NEBU: 2.5000 mg | INHALATION_SOLUTION | RESPIRATORY_TRACT | Status: DC | PRN

## 2013-03-13 MED ORDER — POLYETHYLENE GLYCOL 3350 17 GM/SCOOP PO POWD
17.0000 g | Freq: Every day | ORAL | Status: DC
  Filled 2013-03-13 (×2): qty 255

## 2013-03-13 MED ORDER — VITAMIN D 1000 UNITS PO TABS
1000.0000 [IU] | ORAL_TABLET | Freq: Every day | ORAL | Status: DC
  Administered 2013-03-14 – 2013-03-17 (×4): 1000 [IU] via ORAL
  Filled 2013-03-13 (×4): qty 1

## 2013-03-13 MED ORDER — TRAMADOL HCL 50 MG PO TABS
50.0000 mg | ORAL_TABLET | Freq: Four times a day (QID) | ORAL | Status: DC | PRN
  Administered 2013-03-14: 100 mg via ORAL
  Filled 2013-03-13: qty 2

## 2013-03-13 MED ORDER — SODIUM CHLORIDE 0.9 % IJ SOLN
3.0000 mL | Freq: Two times a day (BID) | INTRAMUSCULAR | Status: DC
  Administered 2013-03-16: 3 mL via INTRAVENOUS

## 2013-03-13 MED ORDER — ASPIRIN EC 81 MG PO TBEC
81.0000 mg | DELAYED_RELEASE_TABLET | Freq: Every day | ORAL | Status: DC
  Administered 2013-03-14 – 2013-03-17 (×4): 81 mg via ORAL
  Filled 2013-03-13 (×4): qty 1

## 2013-03-13 MED ORDER — FAMOTIDINE 20 MG PO TABS
20.0000 mg | ORAL_TABLET | Freq: Every day | ORAL | Status: DC
  Administered 2013-03-14 – 2013-03-17 (×4): 20 mg via ORAL
  Filled 2013-03-13 (×4): qty 1

## 2013-03-13 MED ORDER — METOPROLOL TARTRATE 25 MG PO TABS
25.0000 mg | ORAL_TABLET | Freq: Two times a day (BID) | ORAL | Status: DC
  Administered 2013-03-14 – 2013-03-17 (×7): 25 mg via ORAL
  Filled 2013-03-13 (×8): qty 1

## 2013-03-13 MED ORDER — SODIUM CHLORIDE 0.9 % IV BOLUS (SEPSIS)
500.0000 mL | Freq: Once | INTRAVENOUS | Status: AC
  Administered 2013-03-13: 500 mL via INTRAVENOUS

## 2013-03-13 MED ORDER — NITROGLYCERIN 0.4 MG SL SUBL: 0.4000 mg | SUBLINGUAL_TABLET | SUBLINGUAL | Status: DC | PRN

## 2013-03-13 MED ORDER — WARFARIN - PHARMACIST DOSING INPATIENT
Freq: Every day | Status: DC
  Administered 2013-03-16: 17:00:00

## 2013-03-13 MED ORDER — BISACODYL 5 MG PO TBEC: 5.0000 mg | DELAYED_RELEASE_TABLET | Freq: Every day | ORAL | Status: DC | PRN

## 2013-03-13 MED ORDER — WARFARIN SODIUM 1 MG PO TABS
1.0000 mg | ORAL_TABLET | Freq: Once | ORAL | Status: AC
  Administered 2013-03-14: 1 mg via ORAL
  Filled 2013-03-13: qty 1

## 2013-03-13 MED ORDER — LIDOCAINE HCL 4 % MT SOLN
4.0000 mL | OROMUCOSAL | Status: DC | PRN
  Filled 2013-03-13: qty 4

## 2013-03-13 MED ORDER — OXYCODONE HCL 5 MG PO TABS
5.0000 mg | ORAL_TABLET | ORAL | Status: DC | PRN
  Administered 2013-03-14 – 2013-03-15 (×2): 5 mg via ORAL
  Filled 2013-03-13 (×2): qty 1

## 2013-03-13 MED ORDER — FERROUS SULFATE 325 (65 FE) MG PO TABS
325.0000 mg | ORAL_TABLET | Freq: Every day | ORAL | Status: DC
  Administered 2013-03-14 – 2013-03-17 (×4): 325 mg via ORAL
  Filled 2013-03-13 (×5): qty 1

## 2013-03-13 MED ORDER — SIMVASTATIN 20 MG PO TABS: 20.0000 mg | ORAL_TABLET | Freq: Every day | ORAL | Status: DC

## 2013-03-13 MED ORDER — LORATADINE 10 MG PO TABS
10.0000 mg | ORAL_TABLET | Freq: Every day | ORAL | Status: DC
  Administered 2013-03-14 – 2013-03-17 (×4): 10 mg via ORAL
  Filled 2013-03-13 (×4): qty 1

## 2013-03-13 MED ORDER — SODIUM CHLORIDE 0.9 % IV SOLN
500.0000 mg | Freq: Two times a day (BID) | INTRAVENOUS | Status: DC
  Administered 2013-03-14 – 2013-03-17 (×7): 500 mg via INTRAVENOUS
  Filled 2013-03-13 (×10): qty 500

## 2013-03-13 MED ORDER — DILTIAZEM HCL ER COATED BEADS 240 MG PO CP24
240.0000 mg | ORAL_CAPSULE | Freq: Every day | ORAL | Status: DC
  Administered 2013-03-14 – 2013-03-17 (×4): 240 mg via ORAL
  Filled 2013-03-13 (×4): qty 1

## 2013-03-13 MED ORDER — VANCOMYCIN HCL IN DEXTROSE 1-5 GM/200ML-% IV SOLN
1000.0000 mg | Freq: Once | INTRAVENOUS | Status: AC
  Administered 2013-03-14: 1000 mg via INTRAVENOUS
  Filled 2013-03-13: qty 200

## 2013-03-13 MED ORDER — ENSURE COMPLETE PO LIQD
237.0000 mL | Freq: Two times a day (BID) | ORAL | Status: DC
  Administered 2013-03-14 – 2013-03-17 (×7): 237 mL via ORAL

## 2013-03-13 MED ORDER — CALCIUM CARBONATE-VITAMIN D 500-200 MG-UNIT PO TABS
1.0000 | ORAL_TABLET | Freq: Every day | ORAL | Status: DC
  Administered 2013-03-14 – 2013-03-17 (×4): 1 via ORAL
  Filled 2013-03-13 (×4): qty 1

## 2013-03-13 MED ORDER — FLUTICASONE PROPIONATE 50 MCG/ACT NA SUSP
1.0000 | Freq: Every day | NASAL | Status: DC
  Administered 2013-03-14 – 2013-03-17 (×4): 1 via NASAL
  Filled 2013-03-13: qty 16

## 2013-03-13 MED ORDER — KETOTIFEN FUMARATE 0.025 % OP SOLN
1.0000 [drp] | Freq: Every day | OPHTHALMIC | Status: DC
  Administered 2013-03-14 – 2013-03-17 (×4): 1 [drp] via OPHTHALMIC
  Filled 2013-03-13: qty 5

## 2013-03-13 MED ORDER — FLUCONAZOLE 50 MG PO TABS
50.0000 mg | ORAL_TABLET | Freq: Every day | ORAL | Status: DC
  Administered 2013-03-14 – 2013-03-17 (×4): 50 mg via ORAL
  Filled 2013-03-13 (×4): qty 1

## 2013-03-13 MED ORDER — SODIUM CHLORIDE 0.9 % IV SOLN: INTRAVENOUS | Status: AC

## 2013-03-13 NOTE — ED Notes (Signed)
Lab at bedside

## 2013-03-13 NOTE — Progress Notes (Addendum)
ANTICOAGULATION CONSULT NOTE - Initial Consult  Pharmacy Consult for Coumadin Indication: atrial fibrillation  Allergies  Allergen Reactions  . Ivp Dye [Iodinated Diagnostic Agents] Hives    Patient Measurements: Height: 6' 0.83" (185 cm) Weight: 136 lb 7.4 oz (61.9 kg) IBW/kg (Calculated) : 79.52  Vital Signs: Temp: 97.3 F (36.3 C) (01/23 2031) Temp src: Oral (01/23 2031) BP: 129/71 mmHg (01/23 2031) Pulse Rate: 99 (01/23 2031)  Labs:  Recent Labs  03/13/13 1700 03/13/13 1840  HGB 10.3*  --   HCT 30.2*  --   PLT 377  --   LABPROT  --  19.2*  INR  --  1.67*  CREATININE 1.32  --   TROPONINI <0.30  --     Estimated Creatinine Clearance: 39.1 ml/min (by C-G formula based on Cr of 1.32).   Medical History: Past Medical History  Diagnosis Date  . Anemia   . Colon polyp   . COPD (chronic obstructive pulmonary disease)     Astmatic bronchitis  . Asthma with bronchitis   . Gout     PMH  . Osteoporosis   . Allergic rhinitis   . Skin cancer, basal cell     Dr Nevada Crane, PMH  . Nephrolithiasis   . HLD (hyperlipidemia)   . Skin cancer (melanoma) 2012    Right neck  . Arthritis     SHOULDER  . Headache(784.0)   . RLS (restless legs syndrome)   . Dysrhythmia 08/2012    NEW ONSET ATRIAL FIBRILATION    Medications:  Prescriptions prior to admission  Medication Sig Dispense Refill  . aspirin EC 81 MG tablet Take 81 mg by mouth daily.      . budesonide-formoterol (SYMBICORT) 160-4.5 MCG/ACT inhaler Inhale 2 puffs into the lungs 2 (two) times daily.  1 Inhaler  12  . busPIRone (BUSPAR) 15 MG tablet Take 7.5 mg by mouth 2 (two) times daily.      . calcium citrate-vitamin D (CITRACAL+D) 315-200 MG-UNIT per tablet Take 1 tablet by mouth at bedtime.      . cetirizine (ZYRTEC) 10 MG tablet Take 10 mg by mouth daily as needed for allergies.       . Cholecalciferol (VITAMIN D3) 2000 UNITS TABS Take 1,000 Units by mouth daily.       Marland Kitchen diltiazem (CARDIZEM CD) 240 MG 24 hr  capsule Take 1 capsule (240 mg total) by mouth daily.  30 capsule  3  . diphenhydrAMINE (BENADRYL) 25 mg capsule Take 25 mg by mouth every 4 (four) hours as needed for allergies.      . ferrous sulfate 325 (65 FE) MG tablet Take 325 mg by mouth daily with breakfast.      . fluconazole (DIFLUCAN) 50 MG tablet Take 1 tablet (50 mg total) by mouth daily. For 4 Days  4 tablet  0  . fluticasone (FLONASE) 50 MCG/ACT nasal spray Place 1 spray into the nose 2 (two) times daily as needed for rhinitis.  16 g  2  . furosemide (LASIX) 40 MG tablet Take 1 tablet (40 mg total) by mouth daily. For 5 Days  5 tablet  0  . hydrALAZINE (APRESOLINE) 10 MG tablet Take 1 tablet (10 mg total) by mouth 3 (three) times daily.  30 tablet  3  . HYDROcodone-acetaminophen (NORCO/VICODIN) 5-325 MG per tablet Take 1 tablet by mouth every 6 (six) hours as needed for moderate pain or severe pain.  30 tablet  0  . ketotifen (ZADITOR) 0.025 % ophthalmic  solution Place 1 drop into both eyes daily.       . Lidocaine HCl 4 % SOLN Apply 4 mLs topically as needed (for migraines).       . Mag Aspart-Potassium Aspart (POTASSIUM & MAGNESIUM ASPARTAT PO) Take 2 tablets by mouth daily.      . metoprolol tartrate (LOPRESSOR) 25 MG tablet Take 1 tablet (25 mg total) by mouth 2 (two) times daily.  60 tablet  3  . Multiple Vitamin (MULTIVITAMIN) tablet Take 1 tablet by mouth daily.       . nitroGLYCERIN (NITROSTAT) 0.4 MG SL tablet Place 0.4 mg under the tongue every 5 (five) minutes as needed for chest pain.      Marland Kitchen nystatin (MYCOSTATIN) 100000 UNIT/ML suspension Take 5 mLs (500,000 Units total) by mouth 4 (four) times daily. For 7 Days  180 mL  0  . OVER THE COUNTER MEDICATION Take 1 tablet by mouth at bedtime as needed (for leg cramps). Legatrin PM      . polyethylene glycol powder (MIRALAX) powder Take 17 g by mouth daily.      . potassium chloride SA (K-DUR,KLOR-CON) 20 MEQ tablet Take 1 tablet (20 mEq total) by mouth daily. For 5 Days  5  tablet  0  . pravastatin (PRAVACHOL) 40 MG tablet Take 40 mg by mouth daily.      . ranitidine (ZANTAC) 150 MG tablet Take 150 mg by mouth 2 (two) times daily.      . SF 5000 PLUS 1.1 % CREA dental cream Place 1 application onto teeth at bedtime. As directed      . topiramate (TOPAMAX) 25 MG tablet Take 75 mg by mouth daily.      . traMADol (ULTRAM) 50 MG tablet Take 1-2 tablets (50-100 mg total) by mouth every 6 (six) hours as needed for moderate pain.  30 tablet  0  . warfarin (COUMADIN) 1 MG tablet Take 1 tablet (1 mg total) by mouth every Monday, Wednesday, Friday, and Saturday at 6 PM.  30 tablet  3    Assessment: 48 YOM with CABG on 09/27/87 that was complicated by pneumonia and influenza. He was just discharged 4 days ago after a prolonged hospital stay. He returns today with increased lethargy, weakness, and fever.  Pharmacy consulted to manage Coumadin for Afib while inpatient. INR is subtherapeutic at 1.67. Of note, patient was on Xarelto prior to CABG. He has also been taking fluconazole which can increase INR.  Home dose is Coumadin 1 mg on Mon, Wed, Fri, Sat - last dose 1/23 per wife  Goal of Therapy:  INR 2-3 Monitor platelets by anticoagulation protocol: Yes   Plan:  -Coumadin 1 mg PO tonight (to total 2 mg today) -INR daily -Monitor for s/sx of bleeding  [facility], Pharm.D., BCPS Clinical Pharmacist Pager: [phone] 03/13/2013 8:36 PM

## 2013-03-13 NOTE — ED Provider Notes (Signed)
CSN: [id number]     Arrival date & time 03/13/13  1449 History   First MD Initiated Contact with Patient 03/13/13 [phone]     Chief Complaint  Patient presents with  . Altered Mental Status   (Consider location/radiation/quality/duration/timing/severity/associated sxs/prior Treatment) HPI Comments: Patient is an 80 year old male with history of coronary artery disease. He recently underwent bypass surgery that was complicated by pneumonia and influenza. He had somewhat of a prolonged hospital stay and was just discharged 4 days ago. Since that time, the wife states that he feels weak. She reports that he had a fever earlier today and appeared to be somewhat confused. She states he was uncertain of the date, and where he was. He denies to me he is having any chest pain, shortness of breath, cough, abdominal pain, dysuria, rash or any other symptoms.  Patient is a 80 y.o. male presenting with altered mental status. The history is provided by the patient.  Altered Mental Status Presenting symptoms: confusion   Severity:  Moderate Most recent episode:  Today Episode history:  Single Timing:  Constant Progression:  Unchanged Chronicity:  New Context: recent illness   Context: not head injury and not a nursing home resident   Associated symptoms: fever     Past Medical History  Diagnosis Date  . Anemia   . Colon polyp   . COPD (chronic obstructive pulmonary disease)     Astmatic bronchitis  . Asthma with bronchitis   . Gout     PMH  . Osteoporosis   . Allergic rhinitis   . Skin cancer, basal cell     Dr Nevada Crane, PMH  . Nephrolithiasis   . HLD (hyperlipidemia)   . Skin cancer (melanoma) 2012    Right neck  . Arthritis     SHOULDER  . Headache(784.0)   . RLS (restless legs syndrome)   . Dysrhythmia 08/2012    NEW ONSET ATRIAL FIBRILATION   Past Surgical History  Procedure Laterality Date  . Finger surgery      for foreign body  . Colonoscopy w/ polypectomy      Dr Deatra Ina  .  Inguinal hernia repair    . Nose surgery      Septal Deviation  . Neck surgery  12/2010    Melanoma ; [facility]   . Leg surgery  06/2010     Dr.Lawson, for blood clott. Left leg   . Tee without cardioversion N/A 01/23/2013    Procedure: TRANSESOPHAGEAL ECHOCARDIOGRAM (TEE);  Surgeon: Jolaine Artist, MD;  Location: [facility] ENDOSCOPY;  Service: Cardiovascular;  Laterality: N/A;  sign heart cath consent w/tee consent  . Coronary artery bypass graft N/A 02/20/2013    Procedure: CORONARY ARTERY BYPASS GRAFTING (CABG);  Surgeon: Ivin Poot, MD;  Location: [place];  Service: Open Heart Surgery;  Laterality: N/A;  . Aortic valve replacement N/A 02/20/2013    Procedure: AORTIC VALVE REPLACEMENT (AVR);  Surgeon: Ivin Poot, MD;  Location: [place];  Service: Open Heart Surgery;  Laterality: N/A;  . Intraoperative transesophageal echocardiogram N/A 02/20/2013    Procedure: INTRAOPERATIVE TRANSESOPHAGEAL ECHOCARDIOGRAM;  Surgeon: Ivin Poot, MD;  Location: [place];  Service: Open Heart Surgery;  Laterality: N/A;   Family History  Problem Relation Age of Onset  . Allergies Mother   . Coronary artery disease Mother   . Allergy (severe) Mother   . Allergies Father   . Stroke Father   . Heart attack Father 6  . Asthma Father   .  Allergy (severe) Father   . Allergies Sister   . Allergy (severe) Sister   . Allergies Brother     x2  . Allergy (severe) Brother   . Diabetes Brother   . Allergy (severe) Brother   . COPD Neg Hx   . Cancer Son    History  Substance Use Topics  . Smoking status: Former Smoker -- 3.00 packs/day    Types: Cigarettes    Quit date: 02/19/1961  . Smokeless tobacco: Never Used     Comment: started at age 59  . Alcohol Use: No    Review of Systems  Constitutional: Positive for fever and fatigue. Negative for chills.  Psychiatric/Behavioral: Positive for confusion.  All other systems reviewed and are negative.    Allergies  Ivp dye  Home Medications    Current Outpatient Rx  Name  Route  Sig  Dispense  Refill  . aspirin EC 81 MG tablet   Oral   Take 81 mg by mouth daily.         . budesonide-formoterol (SYMBICORT) 160-4.5 MCG/ACT inhaler   Inhalation   Inhale 2 puffs into the lungs 2 (two) times daily.   1 Inhaler   12   . busPIRone (BUSPAR) 15 MG tablet   Oral   Take 7.5 mg by mouth 2 (two) times daily.         . calcium citrate-vitamin D (CITRACAL+D) 315-200 MG-UNIT per tablet   Oral   Take 1 tablet by mouth at bedtime.         . cetirizine (ZYRTEC) 10 MG tablet   Oral   Take 10 mg by mouth daily as needed for allergies.          . Cholecalciferol (VITAMIN D3) 2000 UNITS TABS   Oral   Take 1,000 Units by mouth daily.          Marland Kitchen diltiazem (CARDIZEM CD) 240 MG 24 hr capsule   Oral   Take 1 capsule (240 mg total) by mouth daily.   30 capsule   3   . diphenhydrAMINE (BENADRYL) 25 mg capsule   Oral   Take 25 mg by mouth every 4 (four) hours as needed for allergies.         . ferrous sulfate 325 (65 FE) MG tablet   Oral   Take 325 mg by mouth daily with breakfast.         . fluconazole (DIFLUCAN) 50 MG tablet   Oral   Take 1 tablet (50 mg total) by mouth daily. For 4 Days   4 tablet   0   . fluticasone (FLONASE) 50 MCG/ACT nasal spray   Nasal   Place 1 spray into the nose 2 (two) times daily as needed for rhinitis.   16 g   2   . furosemide (LASIX) 40 MG tablet   Oral   Take 1 tablet (40 mg total) by mouth daily. For 5 Days   5 tablet   0   . hydrALAZINE (APRESOLINE) 10 MG tablet   Oral   Take 1 tablet (10 mg total) by mouth 3 (three) times daily.   30 tablet   3   . HYDROcodone-acetaminophen (NORCO/VICODIN) 5-325 MG per tablet   Oral   Take 1 tablet by mouth every 6 (six) hours as needed for moderate pain or severe pain.   30 tablet   0   . ketotifen (ZADITOR) 0.025 % ophthalmic solution   Both Eyes   Place  1 drop into both eyes daily.          . Lidocaine HCl 4 % SOLN    Topical   Apply 4 mLs topically as needed (for migraines).          . Mag Aspart-Potassium Aspart (POTASSIUM & MAGNESIUM ASPARTAT PO)   Oral   Take 2 tablets by mouth daily.         . metoprolol tartrate (LOPRESSOR) 25 MG tablet   Oral   Take 1 tablet (25 mg total) by mouth 2 (two) times daily.   60 tablet   3   . Multiple Vitamin (MULTIVITAMIN) tablet   Oral   Take 1 tablet by mouth daily.          Marland Kitchen nystatin (MYCOSTATIN) 100000 UNIT/ML suspension   Oral   Take 5 mLs (500,000 Units total) by mouth 4 (four) times daily. For 7 Days   180 mL   0   . OVER THE COUNTER MEDICATION   Oral   Take 1 tablet by mouth at bedtime as needed (for leg cramps). Legatrin PM         . polyethylene glycol powder (MIRALAX) powder   Oral   Take 17 g by mouth daily.         . potassium chloride SA (K-DUR,KLOR-CON) 20 MEQ tablet   Oral   Take 1 tablet (20 mEq total) by mouth daily. For 5 Days   5 tablet   0   . pravastatin (PRAVACHOL) 40 MG tablet      TAKE ONE (1) TABLET BY MOUTH EVERY DAY   90 tablet   0   . ranitidine (ZANTAC) 150 MG tablet      TAKE ONE TABLET TWICE DAILY   180 tablet   0   . SF 5000 PLUS 1.1 % CREA dental cream   dental   Place 1 application onto teeth at bedtime. As directed         . topiramate (TOPAMAX) 25 MG tablet   Oral   Take 75 mg by mouth daily.         . traMADol (ULTRAM) 50 MG tablet   Oral   Take 1-2 tablets (50-100 mg total) by mouth every 6 (six) hours as needed for moderate pain.   30 tablet   0   . warfarin (COUMADIN) 1 MG tablet   Oral   Take 1 tablet (1 mg total) by mouth every Monday, Wednesday, Friday, and Saturday at 6 PM.   30 tablet   3    BP 120/65  Pulse 87  Temp(Src) 99.2 F (37.3 C) (Oral)  Resp 24  SpO2 97% Physical Exam  Nursing note and vitals reviewed. Constitutional: He appears well-developed and well-nourished. No distress.  HENT:  Head: Normocephalic and atraumatic.  Mouth/Throat: Oropharynx  is clear and moist.  Neck: Normal range of motion. Neck supple.  There is no nuchal rigidity.  Cardiovascular:  No murmur heard. The heart is irregularly irregular.  Pulmonary/Chest: Effort normal and breath sounds normal. No respiratory distress. He has no wheezes.  Abdominal: Soft. Bowel sounds are normal. He exhibits no distension. There is no tenderness.  Musculoskeletal: Normal range of motion. He exhibits no edema.  Lymphadenopathy:    He has no cervical adenopathy.  Neurological: He is alert. No cranial nerve deficit. He exhibits normal muscle tone. Coordination normal.  The patient is awake and alert, however he appears somewhat confused. He is not oriented to date, situation.  Skin: Skin is warm and dry. He is not diaphoretic.  The incision to the anterior chest appears to be healing well. There is no drainage, but there is mild ecchymosis surrounding it.    ED Course  Procedures (including critical care time) Labs Review Labs Reviewed  CBC WITH DIFFERENTIAL  COMPREHENSIVE METABOLIC PANEL  PROTIME-INR  TROPONIN I  URINALYSIS, ROUTINE W REFLEX MICROSCOPIC   Imaging Review No results found.   Date: 03/13/2013  Rate: 84  Rhythm: atrial fibrillation  QRS Axis: left  Intervals: normal  ST/T Wave abnormalities: normal  Conduction Disutrbances:none  Narrative Interpretation:   Old EKG Reviewed: unchanged    MDM  No diagnosis found. Patient is an 80 year old male who is status post coronary artery bypass graft and valve replacement last month. He is brought in today for confusion and fever. Workup reveals urinary tract infection and what appears to be a pneumonia. He has an elevated white count of 21,000. Blood cultures are in the process of being drawn. Once this is complete he will be treated with vancomycin and Zosyn for what I suspect is a hospital-acquired pneumonia. This would also be appropriate for the urinary tract infection.  Is also found to have atrial  fibrillation which appears to be new. The rate is well-controlled.  I've spoken with the hospitalist who agrees to admit the patient to telemetry.    Veryl Speak, MD 03/13/13 [phone]

## 2013-03-13 NOTE — Progress Notes (Signed)
This encounter was created in error - please disregard.

## 2013-03-13 NOTE — ED Notes (Signed)
Per EMS- pt was sent from home per request of home health nurse and dr Melvenia Needles. Pt had [place] x3 on 02-20-13 pt then developed flu and pneumonia and was released home yesterday. Pt was reported to be altered by home health nurse. Pt wife also reports strong smelling urine. Pt was warm to touch with EMS. Pt denies any complaints or pain. Orthostatics were negative with Home health rn.

## 2013-03-13 NOTE — Progress Notes (Signed)
ANTIBIOTIC CONSULT NOTE - INITIAL  Pharmacy Consult for Vancomycin, Zosyn Indication: Sepsis, UTI / r/o Pneumonia   Allergies  Allergen Reactions  . Ivp Dye [Iodinated Diagnostic Agents] Hives    Patient Measurements:   Adjusted Body Weight: n/a   Vital Signs: Temp: 99.2 F (37.3 C) (01/23 1517) Temp src: Oral (01/23 1517) BP: 124/57 mmHg (01/23 1921) Pulse Rate: 63 (01/23 1730) Intake/Output from previous day:   Intake/Output from this shift:    Labs:  Recent Labs  03/13/13 1700  WBC 21.2*  HGB 10.3*  PLT 377  CREATININE 1.32   The CrCl is unknown because both a height and weight (above a minimum accepted value) are required for this calculation. No results found for this basename: VANCOTROUGH, Corlis Leak, VANCORANDOM, GENTTROUGH, GENTPEAK, GENTRANDOM, TOBRATROUGH, TOBRAPEAK, TOBRARND, AMIKACINPEAK, AMIKACINTROU, AMIKACIN,  in the last 72 hours   Microbiology: Recent Results (from the past 720 hour(s))  URINE CULTURE     Status: None   Collection Time    02/20/13  6:25 AM      Result Value Range Status   Specimen Description URINE, CLEAN CATCH   Final   Special Requests A   Final   Culture  Setup Time     Final   Value: 02/20/2013 06:59     Performed at [facility]     Final   Value: >=100,000 COLONIES/ML     Performed at [facility]   Culture     Final   Value: PANTOEA AGGLOMERANS     Performed at [facility]   Report Status 02/22/2013 FINAL   Final   Organism ID, Bacteria PANTOEA AGGLOMERANS   Final  CULTURE, BLOOD (SINGLE)     Status: None   Collection Time    02/26/13  4:50 PM      Result Value Range Status   Specimen Description BLOOD LEFT ARM   Final   Special Requests BOTTLES DRAWN AEROBIC AND ANAEROBIC 5CC   Final   Culture  Setup Time     Final   Value: 02/26/2013 21:03     Performed at [facility]   Culture     Final   Value: NO GROWTH 5 DAYS     Performed at [facility]   Report  Status 03/04/2013 FINAL   Final  RESPIRATORY VIRUS PANEL     Status: Abnormal   Collection Time    02/26/13  5:00 PM      Result Value Range Status   Source - RVPAN NASAL SWAB   Corrected   Comment: CORRECTED ON 01/09 AT 2208: PREVIOUSLY REPORTED AS NASAL SWAB   Respiratory Syncytial Virus A NOT DETECTED   Final   Respiratory Syncytial Virus B NOT DETECTED   Final   Influenza A DETECTED (*)  Final   Influenza B NOT DETECTED   Final   Parainfluenza 1 NOT DETECTED   Final   Parainfluenza 2 NOT DETECTED   Final   Parainfluenza 3 NOT DETECTED   Final   Metapneumovirus DETECTED (*)  Final   Rhinovirus NOT DETECTED   Final   Adenovirus NOT DETECTED   Final   Influenza A H1 NOT DETECTED   Final   Influenza A H3 DETECTED (*)  Final   Comment: (NOTE)           Normal Reference Range for each Analyte: NOT DETECTED     Testing performed using the Lumine[employer] xTAG Respiratory Viral Panel  test     kit.     This test was developed and its performance characteristics determined     by [facility]. It has not been cleared or approved by the [place]     Food and Drug Administration. This test is used for clinical purposes.     It should not be regarded as investigational or for research. This     laboratory is certified under the [place] (CLIA) as qualified to perform high complexity     clinical laboratory testing.     Performed at [facility] History: Past Medical History  Diagnosis Date  . Anemia   . Colon polyp   . COPD (chronic obstructive pulmonary disease)     Astmatic bronchitis  . Asthma with bronchitis   . Gout     PMH  . Osteoporosis   . Allergic rhinitis   . Skin cancer, basal cell     Dr Nevada Crane, PMH  . Nephrolithiasis   . HLD (hyperlipidemia)   . Skin cancer (melanoma) 2012    Right neck  . Arthritis     SHOULDER  . Headache(784.0)   . RLS (restless legs syndrome)   . Dysrhythmia 08/2012    NEW ONSET  ATRIAL FIBRILATION    Medications:   (Not in a hospital admission) Assessment: 74 YOM with recent CABG that was complicated by pneumonia and influenza. He was just discharged 4 days ago after a prolonged hospital stay. He is mildly febrile with a temp of 99.2 F and elevated WBC of 21.2. CrCl ~ 39.7 mL/min. Pharmacy to start IV antibiotics for sepsis. He is scheduled to receive one dose of Zosyn 3.375 gm and Vancomycin 1 gm in ED per EDP.   Goal of Therapy:  Vancomycin trough level 15-20 mcg/ml  Plan:  1) Vancomycin 500 mg IV Q 12 hours  2) Zosyn 3.375 gm IV Q 8 hours  3) F/u CBC, renal fx, cultures, and patient clinical status 4) Order Vanc trough at steady state   Albertina Parr, PharmD.  Clinical Pharmacist Pager [phone]

## 2013-03-13 NOTE — H&P (Signed)
Patient Demographics  Robert Richard, is a 80 y.o. male  MRN: [id number]   DOB - 06/13/1930  Admit Date - 03/13/2013  Outpatient Primary MD for the patient is Unice Cobble, MD   With History of -  Past Medical History  Diagnosis Date  . Anemia   . Colon polyp   . COPD (chronic obstructive pulmonary disease)     Astmatic bronchitis  . Asthma with bronchitis   . Gout     PMH  . Osteoporosis   . Allergic rhinitis   . Skin cancer, basal cell     Dr Nevada Crane, PMH  . Nephrolithiasis   . HLD (hyperlipidemia)   . Skin cancer (melanoma) 2012    Right neck  . Arthritis     SHOULDER  . Headache(784.0)   . RLS (restless legs syndrome)   . Dysrhythmia 08/2012    NEW ONSET ATRIAL FIBRILATION      Past Surgical History  Procedure Laterality Date  . Finger surgery      for foreign body  . Colonoscopy w/ polypectomy      Dr Deatra Ina  . Inguinal hernia repair    . Nose surgery      Septal Deviation  . Neck surgery  12/2010    Melanoma ; [facility]   . Leg surgery  06/2010     Dr.Lawson, for blood clott. Left leg   . Tee without cardioversion N/A 01/23/2013    Procedure: TRANSESOPHAGEAL ECHOCARDIOGRAM (TEE);  Surgeon: Jolaine Artist, MD;  Location: [facility] ENDOSCOPY;  Service: Cardiovascular;  Laterality: N/A;  sign heart cath consent w/tee consent  . Coronary artery bypass graft N/A 02/20/2013    Procedure: CORONARY ARTERY BYPASS GRAFTING (CABG);  Surgeon: Ivin Poot, MD;  Location: [facility];  Service: Open Heart Surgery;  Laterality: N/A;  . Aortic valve replacement N/A 02/20/2013    Procedure: AORTIC VALVE REPLACEMENT (AVR);  Surgeon: Ivin Poot, MD;  Location: [facility];  Service: Open Heart Surgery;  Laterality: N/A;  . Intraoperative transesophageal echocardiogram N/A 02/20/2013    Procedure: INTRAOPERATIVE  TRANSESOPHAGEAL ECHOCARDIOGRAM;  Surgeon: Ivin Poot, MD;  Location: [facility];  Service: Open Heart Surgery;  Laterality: N/A;    in for   Chief Complaint  Patient presents with  . Altered Mental Status     HPI  Robert Richard  is a 80 y.o. male, recently discharged from [facility], after lengthy hospital course the status post coronary artery bypass grafting and aortic valve replacement, patient brought was brought by his family to do to increase lethargy, weakness, and fever at home, the patient had basic workup in ED did show significant leukocytosis at 21,000, low-grade fever at 99.2, tachypnea, patients had urinalysis positive for UTI, history 6 they did show mild left lung capacity and left pleural effusion, patient is known to have bilateral capacity during his previous hospital course, the patient is known to have A. fib, was  rate controlled, patient is on warfarin, patient was ordered IV vancomycin and Zosyn, after blood cultures were sent, for treatment of healthcare acquired pneumonia and UTI.    Review of Systems    In addition to the HPI above, No Fever-chills, has generalized weakness and fatigue and poor appetite No Headache, No changes with Vision or hearing, No problems swallowing food or Liquids, complains of oral thrush No Chest pain, Cough or Shortness of Breath, No Abdominal pain, No Nausea or Vommitting, Bowel movements are regular, No Blood in stool or Urine, No dysuria, No new skin rashes or bruises, No new joints pains-aches,  No new weakness, tingling, numbness in any extremity, No recent weight gain or loss, No polyuria, polydypsia or polyphagia, No significant Mental Stressors.  A full 10 point Review of Systems was done, except as stated above, all other Review of Systems were negative.   Social History History  Substance Use Topics  . Smoking status: Former Smoker -- 3.00 packs/day    Types: Cigarettes    Quit date: 02/19/1961  .  Smokeless tobacco: Never Used     Comment: started at age 75  . Alcohol Use: No     Family History Family History  Problem Relation Age of Onset  . Allergies Mother   . Coronary artery disease Mother   . Allergy (severe) Mother   . Allergies Father   . Stroke Father   . Heart attack Father 42  . Asthma Father   . Allergy (severe) Father   . Allergies Sister   . Allergy (severe) Sister   . Allergies Brother     x2  . Allergy (severe) Brother   . Diabetes Brother   . Allergy (severe) Brother   . COPD Neg Hx   . Cancer Son     Prior to Admission medications   Medication Sig Start Date End Date Taking? Authorizing Provider  aspirin EC 81 MG tablet Take 81 mg by mouth daily.   Yes Historical Provider, MD  budesonide-formoterol (SYMBICORT) 160-4.5 MCG/ACT inhaler Inhale 2 puffs into the lungs 2 (two) times daily. 03/09/13  Yes [name] [name], PA-C  busPIRone (BUSPAR) 15 MG tablet Take 7.5 mg by mouth 2 (two) times daily.   Yes Historical Provider, MD  calcium citrate-vitamin D (CITRACAL+D) 315-200 MG-UNIT per tablet Take 1 tablet by mouth at bedtime.   Yes Historical Provider, MD  cetirizine (ZYRTEC) 10 MG tablet Take 10 mg by mouth daily as needed for allergies.    Yes Historical Provider, MD  Cholecalciferol (VITAMIN D3) 2000 UNITS TABS Take 1,000 Units by mouth daily.    Yes Historical Provider, MD  diltiazem (CARDIZEM CD) 240 MG 24 hr capsule Take 1 capsule (240 mg total) by mouth daily. 03/09/13  Yes [name] [name], PA-C  diphenhydrAMINE (BENADRYL) 25 mg capsule Take 25 mg by mouth every 4 (four) hours as needed for allergies.   Yes Historical Provider, MD  ferrous sulfate 325 (65 FE) MG tablet Take 325 mg by mouth daily with breakfast.   Yes Historical Provider, MD  fluconazole (DIFLUCAN) 50 MG tablet Take 1 tablet (50 mg total) by mouth daily. For 4 Days 03/10/13  Yes [name] [name], PA-C  fluticasone (FLONASE) 50 MCG/ACT nasal spray Place 1 spray into the nose 2 (two) times daily as  needed for rhinitis. 08/16/11 02/19/97 Yes Hendricks Limes, MD  furosemide (LASIX) 40 MG tablet Take 1 tablet (40 mg total) by mouth daily. For 5 Days 03/09/13  Yes Ellwood Handler, PA-C  hydrALAZINE (APRESOLINE) 10 MG tablet Take 1 tablet (10 mg total) by mouth 3 (three) times daily. 03/09/13  Yes [name] [name], PA-C  HYDROcodone-acetaminophen (NORCO/VICODIN) 5-325 MG per tablet Take 1 tablet by mouth every 6 (six) hours as needed for moderate pain or severe pain. 03/09/13  Yes [name] [name], PA-C  ketotifen (ZADITOR) 0.025 % ophthalmic solution Place 1 drop into both eyes daily.    Yes Historical Provider, MD  Lidocaine HCl 4 % SOLN Apply 4 mLs topically as needed (for migraines).    Yes Historical Provider, MD  Mag Aspart-Potassium Aspart (POTASSIUM & MAGNESIUM ASPARTAT PO) Take 2 tablets by mouth daily.   Yes Historical Provider, MD  metoprolol tartrate (LOPRESSOR) 25 MG tablet Take 1 tablet (25 mg total) by mouth 2 (two) times daily. 03/09/13  Yes [name] [name], PA-C  Multiple Vitamin (MULTIVITAMIN) tablet Take 1 tablet by mouth daily.    Yes Historical Provider, MD  nitroGLYCERIN (NITROSTAT) 0.4 MG SL tablet Place 0.4 mg under the tongue every 5 (five) minutes as needed for chest pain.   Yes Historical Provider, MD  nystatin (MYCOSTATIN) 100000 UNIT/ML suspension Take 5 mLs (500,000 Units total) by mouth 4 (four) times daily. For 7 Days 03/09/13  Yes [name] [name], PA-C  OVER THE COUNTER MEDICATION Take 1 tablet by mouth at bedtime as needed (for leg cramps). Legatrin PM   Yes Historical Provider, MD  polyethylene glycol powder (MIRALAX) powder Take 17 g by mouth daily.   Yes Historical Provider, MD  potassium chloride SA (K-DUR,KLOR-CON) 20 MEQ tablet Take 1 tablet (20 mEq total) by mouth daily. For 5 Days 03/09/13  Yes [name] [name], PA-C  pravastatin (PRAVACHOL) 40 MG tablet Take 40 mg by mouth daily.   Yes Historical Provider, MD  ranitidine (ZANTAC) 150 MG tablet Take 150 mg by mouth 2 (two) times daily.    Yes Historical Provider, MD  SF 5000 PLUS 1.1 % CREA dental cream Place 1 application onto teeth at bedtime. As directed 02/21/11  Yes Historical Provider, MD  topiramate (TOPAMAX) 25 MG tablet Take 75 mg by mouth daily.   Yes Historical Provider, MD  traMADol (ULTRAM) 50 MG tablet Take 1-2 tablets (50-100 mg total) by mouth every 6 (six) hours as needed for moderate pain. 03/09/13  Yes [name] [name], PA-C  warfarin (COUMADIN) 1 MG tablet Take 1 tablet (1 mg total) by mouth every Monday, Wednesday, Friday, and Saturday at 6 PM. 03/09/13  Yes [name] [name], PA-C    Allergies  Allergen Reactions  . Ivp Dye [Iodinated Diagnostic Agents] Hives    Physical Exam  Vitals  Blood pressure 124/57, pulse 63, temperature 99.2 F (37.3 C), temperature source Oral, resp. rate 36, SpO2 96.00%.   1. General elderly ill-appearing male lying in bed in NAD,   2. Normal affect and insight, Not Suicidal or Homicidal, Awake Alert, Oriented X 3.  3. No F.N deficits, ALL C.Nerves Intact, Strength 5/5 all 4 extremities, Sensation intact all 4 extremities, Plantars down going.  4. Ears and Eyes appear Normal, Conjunctivae clear, PERRLA. Moist Oral Mucosa.  5. Supple Neck, No JVD, No cervical lymphadenopathy appriciated, No Carotid Bruits.  6. Symmetrical Chest wall movement, Good air movement bilaterally, CTAB.  7. irregular irregular rhythm, No Gallops, Rubs or Murmurs, No Parasternal Heave.  8. Positive Bowel Sounds, Abdomen Soft, Non tender, No organomegaly appriciated,No rebound -guarding or rigidity.  9.  No Cyanosis, Normal Skin Turgor, No Skin Rash or Bruise.  10. Good muscle tone,  joints appear  normal , no effusions, Normal ROM.  11. No Palpable Lymph Nodes in Neck or Axillae   Data Review  CBC  Recent Labs Lab 03/13/13 1700  WBC 21.2*  HGB 10.3*  HCT 30.2*  PLT 377  MCV 85.8  MCH 29.3  MCHC 34.1  RDW 14.1  LYMPHSABS 1.1  MONOABS 1.3*  EOSABS 0.0  BASOSABS 0.0    ------------------------------------------------------------------------------------------------------------------  Chemistries   Recent Labs Lab 03/13/13 1700  NA 132*  K 4.6  CL 96  CO2 20  GLUCOSE 131*  BUN 29*  CREATININE 1.32  CALCIUM 8.6  AST 28  ALT 44  ALKPHOS 160*  BILITOT 0.5   ------------------------------------------------------------------------------------------------------------------ CrCl is unknown because both a height and weight (above a minimum accepted value) are required for this calculation. ------------------------------------------------------------------------------------------------------------------ No results found for this basename: TSH, T4TOTAL, FREET3, T3FREE, THYROIDAB,  in the last 72 hours   Coagulation profile  Recent Labs Lab 03/07/13 0500 03/08/13 0442 03/09/13 0522 03/13/13 1840  INR 2.16* 2.38* 2.55* 1.67*   ------------------------------------------------------------------------------------------------------------------- No results found for this basename: DDIMER,  in the last 72 hours -------------------------------------------------------------------------------------------------------------------  Cardiac Enzymes  Recent Labs Lab 03/13/13 1700  TROPONINI <0.30   ------------------------------------------------------------------------------------------------------------------ No components found with this basename: POCBNP,    ---------------------------------------------------------------------------------------------------------------  Urinalysis    Component Value Date/Time   COLORURINE YELLOW 03/13/2013 1728   APPEARANCEUR HAZY* 03/13/2013 1728   LABSPEC 1.020 03/13/2013 1728   PHURINE 7.5 03/13/2013 1728   GLUCOSEU NEGATIVE 03/13/2013 1728   HGBUR NEGATIVE 03/13/2013 1728   BILIRUBINUR NEGATIVE 03/13/2013 1728   KETONESUR NEGATIVE 03/13/2013 1728   PROTEINUR NEGATIVE 03/13/2013 1728   UROBILINOGEN 2.0*  03/13/2013 1728   NITRITE NEGATIVE 03/13/2013 1728   LEUKOCYTESUR MODERATE* 03/13/2013 1728    ----------------------------------------------------------------------------------------------------------------  Imaging results:   Dg Chest 2 View  03/13/2013   CLINICAL DATA:  Altered mental status, asthma  EXAM: CHEST  2 VIEW  COMPARISON:  03/06/2013  FINDINGS: Cardiomediastinal silhouette is stable. Osteopenia and degenerative changes thoracic spine again noted. Stable compression deformities thoracic spine.  Status post median sternotomy. There is small left pleural effusion with streaky left basilar atelectasis or infiltrate. No pulmonary edema. Atherosclerotic calcifications of thoracic aorta again noted.  IMPRESSION: Small left pleural effusion with streaky left basilar atelectasis or infiltrate. No pulmonary edema. Status post median sternotomy.   Electronically Signed   By: Lahoma Crocker M.D.   On: 03/13/2013 16:17   Dg Chest 2 View  03/06/2013   CLINICAL DATA:  Postop CABG.  EXAM: CHEST  2 VIEW  COMPARISON:  03/05/2013  FINDINGS: Left arm PICC tip at the cavoatrial junction. Aortic valve replacement. CABG.  Improved aeration in the lung apices with decreased infiltrate or edema bilaterally. Mild improvement in left lower lobe atelectasis and small left effusion.  IMPRESSION: Improved aeration in the lung apices bilaterally. Improvement in left lower lobe atelectasis.   Electronically Signed   By: Franchot Gallo M.D.   On: 03/06/2013 07:48   Dg Chest 2 View  02/25/2013   CLINICAL DATA:  Post CABG and AVR.  EXAM: CHEST  2 VIEW  COMPARISON:  02/24/2013  FINDINGS: Two views of the chest demonstrate increased airspace densities in the left upper lung. Again noted are airspace densities in the right upper lung and left lung base. Evidence for small pleural effusions. No evidence for a pneumothorax. Heart size is within normal limits. Compression deformities in the thoracic spine. Post cardiac surgery  changes.  IMPRESSION: Bilateral patchy airspace disease  with progression in the left upper lung. Differential diagnosis includes pneumonia versus asymmetric edema.  Small bilateral pleural effusions.   Electronically Signed   By: Markus Daft M.D.   On: 02/25/2013 08:18   Dg Chest 2 View  02/24/2013   CLINICAL DATA:  Weakness.  CABG.  EXAM: CHEST  2 VIEW  COMPARISON:  02/23/2013.  08/26/2012.  FINDINGS: Interim removal of right jugular sheath. Noted on today's examination are patchy pulmonary infiltrates in both upper lobes and left lower lobe. Pneumonia could present in this fashion. Small left pleural effusion. Prior CABG. Cardiac valve replacement. There is cardiomegaly. Heart size and pulmonary vascularity are stable. Diffuse degenerative changes and osteopenia thoracic spine. Stable thoracic spine compression fractures.  IMPRESSION: New onset bilateral upper lobe and left lower lobe pulmonary infiltrates suggesting pneumonia. Associated small left pleural effusion.   Electronically Signed   By: Gaston   On: 02/24/2013 08:06   Ct Head Wo Contrast  03/13/2013   CLINICAL DATA:  Altered mental status. Recent cardiac bypass surgery. The patient was discharged to the nursing home yesterday.  EXAM: CT HEAD WITHOUT CONTRAST  TECHNIQUE: Contiguous axial images were obtained from the base of the skull through the vertex without intravenous contrast.  COMPARISON:  CT head without contrast 03/02/2013.  FINDINGS: Remote lacunar infarcts are again noted within the basal ganglia and scratch the remote lacunar infarcts are again noted within the cerebellum. No acute cortical infarct, hemorrhage, or mass lesion is present. The ventricles are proportionate to the degree of atrophy and stable. No significant extra-axial fluid collection is present.  The paranasal sinuses and mastoid air cells are clear. The osseous skull is intact.  IMPRESSION: 1. No acute intracranial abnormality or significant interval change. 2.  Stable remote lacunar infarcts of the cerebellum.   Electronically Signed   By: Lawrence Santiago M.D.   On: 03/13/2013 16:03   Ct Head Wo Contrast  03/02/2013   CLINICAL DATA:  Slurred speech and facial droop.  Rule out CVA  EXAM: CT HEAD WITHOUT CONTRAST  TECHNIQUE: Contiguous axial images were obtained from the base of the skull through the vertex without intravenous contrast.  COMPARISON:  MRI 12/12/2012  FINDINGS: Mild atrophy. Negative for acute infarct, hemorrhage, or mass lesion. Hypodensity right occipital white matter consistent with chronic ischemia. No mass effect or midline shift. Small chronic infarcts cerebellum bilaterally.  Calvarium is intact.  IMPRESSION: No acute abnormality.   Electronically Signed   By: Franchot Gallo M.D.   On: 03/02/2013 09:24   Ct Chest Wo Contrast  02/26/2013   CLINICAL DATA:  Low grade fever and shortness of breath.  EXAM: CT CHEST WITHOUT CONTRAST  TECHNIQUE: Multidetector CT imaging of the chest was performed following the standard protocol without IV contrast.  COMPARISON:  Multiple prior chest x-rays  FINDINGS: The chest wall is unremarkable. No supraclavicular or axillary mass or adenopathy. The thyroid gland is grossly normal. The bony thorax is intact. Surgical changes from bypass surgery are noted.  The heart is normal in size. No pericardial effusion. A prosthetic aortic valve is noted along with surgical changes from bypass surgery and dense coronary artery calcifications. The esophagus is grossly normal. Dense aortic calcifications but no focal aneurysm.  There are moderate to large bilateral pleural effusions. There is dense upper lobe airspace consolidation with air bronchograms and more patchy ill-defined airspace opacities in the lower lobes bilaterally, left greater than right. No definite findings for pulmonary edema.  The trachea appears normal.  The mainstem bronchi are somewhat compressed and slit-like. No bronchiectasis.  The upper abdomen is  unremarkable. Dense aortic calcifications are noted. Eighty upper pole renal cyst is noted.  IMPRESSION: Moderate bilateral pleural effusions and bilateral infiltrates/pneumonia.  Stable surgical changes from bypass surgery and aortic valve replacement surgery.  The mainstem bronchi are somewhat narrowed.   Electronically Signed   By: Loralie Champagne M.D.   On: 02/26/2013 11:22   Dg Chest Port 1 View  03/05/2013   CLINICAL DATA:  Status post CABG.  EXAM: PORTABLE CHEST - 1 VIEW  COMPARISON:  03/04/2013  FINDINGS: There is significantly improved aeration of both lungs with some residual atelectasis remaining in the left lower lobe. No overt edema or evidence of pneumothorax. No significant pleural effusions. The heart size and mediastinal contours are stable.  IMPRESSION: Significantly improved aeration of both lungs.   Electronically Signed   By: Irish Lack M.D.   On: 03/05/2013 08:24   Dg Chest Port 1 View  03/04/2013   CLINICAL DATA:  Pulmonary infiltrates.  EXAM: PORTABLE CHEST - 1 VIEW  COMPARISON:  03/03/2013 .  FINDINGS: Left PICC line in stable position. Persistent bilateral upper lobe and left lower lobe infiltrates are present. These appear increased in prominence from prior exam. Prior CABG. Cardiomegaly with pulmonary venous congestion and diffuse interstitial prominence with left pleural effusion noted. These findings are consistent with congestive heart failure. These findings are new. No pneumothorax or acute osseous abnormality.  IMPRESSION: 1. Stable PICC line position. 2. Persistent bilateral upper lobe and left lower lobe pulmonary infiltrates which increased slightly from prior exam. 3. Prior CABG. New onset congestive heart failure with pulmonary interstitial edema and left-sided pleural effusion.   Electronically Signed   By: Maisie Fus  Register   On: 03/04/2013 07:31   Dg Chest Port 1 View  03/03/2013   CLINICAL DATA:  CABG.  EXAM: PORTABLE CHEST - 1 VIEW  COMPARISON:  03/02/2013.   FINDINGS: Central line noted in stable position. PICC line noted in stable position. Stable cardiomegaly. Prior CABG . Persistent bilateral upper lobe and left lower lobe infiltrates are present. Left-sided pleural effusion is present. No pneumothorax.  IMPRESSION: Persistent bilateral upper lobe and left lower lobe infiltrates.   Electronically Signed   By: Maisie Fus  Register   On: 03/03/2013 08:39   Dg Chest Port 1 View  03/02/2013   CLINICAL DATA:  Status post CABG.  EXAM: PORTABLE CHEST - 1 VIEW  COMPARISON:  03/01/2013  FINDINGS: There is improved aeration of the left lung with upper and lower lung infiltrates remaining. The right upper lobe infiltrate is slightly more dense. No large pleural effusions are identified. The heart size is stable. There is stable positioning of a left-sided PICC line.  IMPRESSION: Improved aeration of the left lung with residual upper and lower lung infiltrates. Slightly more dense right upper lobe infiltrate.   Electronically Signed   By: Irish Lack M.D.   On: 03/02/2013 07:52   Dg Chest Port 1 View  03/01/2013   CLINICAL DATA:  ARDS  EXAM: PORTABLE CHEST - 1 VIEW  COMPARISON:  02/28/2013  FINDINGS: Previous coronary bypass changes noted. Mild cardiomegaly with vascular congestion. Persistent bilateral upper lobe and left lower lobe airspace opacities. Minimal right base atelectasis. Small effusions remain. No significant interval change. No pneumothorax.  IMPRESSION: Stable asymmetric bilateral airspace process compatible with ARDS or pneumonia. No pneumothorax.   Electronically Signed   By: Ruel Favors M.D.   On: 03/01/2013  09:05   Dg Chest Port 1 View  02/28/2013   CLINICAL DATA:  ARDS  EXAM: PORTABLE CHEST - 1 VIEW  COMPARISON:  02/27/2013  FINDINGS: Prior coronary bypass changes noted. Left PICC line tip at the SVC RA junction. Atherosclerosis present of the aorta. Asymmetric Stable airspace process throughout both lungs, most pronounced in the upper lobes and  in the left lower lobe. No enlarging effusion or pneumothorax. No significant interval change.  IMPRESSION: Stable bilateral airspace process in the upper lobes and left lower lobe compatible with ARDS versus pneumonia.   Electronically Signed   By: Daryll Brod M.D.   On: 02/28/2013 08:22   Dg Chest Port 1 View  02/27/2013   CLINICAL DATA:  Shortness of breath, ARDS.  EXAM: PORTABLE CHEST - 1 VIEW  COMPARISON:  February 26, 2013  FINDINGS: The heart size and mediastinal contours are stable. Heart size is enlarged. Airspace consolidation of bilateral upper lobes and the left lung base not changed. The visualized skeletal structures are stable.  IMPRESSION: Persistent airspace infiltrates in bilateral upper lobes and in the left lung base unchanged compared to prior exam, favor pneumonia   Electronically Signed   By: Abelardo Diesel M.D.   On: 02/27/2013 07:58   Dg Chest Port 1 View  02/26/2013   CLINICAL DATA:  Pneumonitis, COPD, asthma  EXAM: PORTABLE CHEST - 1 VIEW  COMPARISON:  Portable exam [id number] hr compared to 02/25/2013  FINDINGS: Enlargement of cardiac silhouette post CABG and AVR.  Atherosclerotic calcification aorta.  Patchy airspace pulmonary infiltrates diffusely in both lungs, greatest in upper lobes, question pneumonia or less likely edema.  No pleural effusion or pneumothorax.  Bones demineralized.  IMPRESSION: Enlargement of cardiac silhouette post CABG and AVR.  Persistent bilateral airspace infiltrates greatest in the upper lobes, favor pneumonia over edema.   Electronically Signed   By: Lavonia Dana M.D.   On: 02/26/2013 16:49   Dg Chest Port 1 View  02/23/2013   CLINICAL DATA:  Followup coronary bypass grafting  EXAM: PORTABLE CHEST - 1 VIEW  COMPARISON:  02/22/2013  FINDINGS: Cardiac shadow is stable. Postoperative changes are again seen. A right jugular sheath is again noted and stable. Continued improved aeration is noted. No pneumothorax is seen. No significant pleural effusion is noted.   IMPRESSION: Postoperative changes.  No acute abnormality is noted.   Electronically Signed   By: Inez Catalina M.D.   On: 02/23/2013 07:43   Dg Chest Port 1 View  02/22/2013   CLINICAL DATA:  Status post cardiac surgery.  EXAM: PORTABLE CHEST - 1 VIEW  COMPARISON:  Chest x-ray 02/21/2013.  FINDINGS: Previously noted left-sided chest tube has been removed. Likewise, the previously noted Swan-Ganz catheter has been removed, while the right IJ central venous Cordis remains in position with tip terminating in the proximal superior vena cava. Previously noted mediastinal/ pericardial drain has also been removed. Lung volumes remain low, and there are persistent bibasilar opacities, presumably postoperative subsegmental atelectasis. Small left pleural effusion. No appreciable left pneumothorax following removal of chest tube. No evidence of pulmonary edema. Mild enlargement of the cardiopericardial silhouette, similar to the prior study and within normal limits in this postoperative patient. Atherosclerosis in the thoracic aorta. Status post median sternotomy for CABG.  IMPRESSION: 1. Postoperative changes and support apparatus, as above. 2. Small left pleural effusion. No significant pneumothorax following removal of left-sided chest tube. 3. Persistent bibasilar opacities in the setting of low lung volumes in this postoperative patient  presumably represent postoperative subsegmental atelectasis.   Electronically Signed   By: Vinnie Langton M.D.   On: 02/22/2013 09:28   Dg Chest Portable 1 View In Am  02/21/2013   CLINICAL DATA:  Status post CABG and aortic valve replacement  EXAM: PORTABLE CHEST - 1 VIEW  COMPARISON:  Prior chest x-ray 02/20/2013  FINDINGS: The patient is heavily rotated to the right. The tip of the Swan-Ganz catheter appears to have advanced and is now in the mid to distal right lower lobe pulmonary artery. The patient has been extubated and the nasogastric tube removed. Subxiphoid mediastinal  drain and left thoracostomy tube remain in unchanged position. Patient is status post median sternotomy with evidence of aortic valve replacement and multivessel CABG. Atherosclerotic calcifications noted in the transverse aorta. Slightly lower inspiratory volumes with increased left basilar atelectasis. No overt pulmonary edema. No pneumothorax.  IMPRESSION: 1. The the tip of the Swan-Ganz catheter has advanced and is now in the mid to distal right lower lobe pulmonary artery. Recommend withdrawing this at least 3 cm to avoid right lower lobe pulmonary artery injury during balloon inflation. 2. Interval extubation with resultant slightly lower inspiratory volumes and increased left basilar atelectasis.   Electronically Signed   By: Jacqulynn Cadet M.D.   On: 02/21/2013 08:32   Dg Chest Portable 1 View  02/20/2013   CLINICAL DATA:  Postop  EXAM: PORTABLE CHEST - 1 VIEW  COMPARISON:  02/10/2013  FINDINGS: Mild cardiac enlargement similar to prior study. Endotracheal tube 3.9 cm above carina. Right Swan-Ganz catheter tip in right main pulmonary artery. NG tube into the stomach. Left chest tube. Mediastinal drain. Prosthetic cardiac valve noted. Mitral annular calcification noted. Right lung is clear. On the left side, there is perihilar opacity. There is a small left pleural effusion. No significant pneumothorax.  IMPRESSION: Lines and tubes as described above.  Postoperative change, with small left effusion, left lower lobe consolidation likely representing atelectasis, and left perihilar opacity possibly representing asymmetric pulmonary edema, mild.   Electronically Signed   By: Skipper Cliche M.D.   On: 02/20/2013 14:40   Dg Swallowing Func-speech Pathology  02/26/2013   Orbie Pyo Emma, CCC-SLP     02/26/2013  3:03 PM Objective Swallowing Evaluation: Modified Barium Swallowing Study   Patient Details  Name: DARRIN DERVISEVIC MRN: [id number] Date of Birth: 06-19-1930  Today's Date: 02/26/2013 Time: [id number] SLP  Time Calculation (min): 27 min  Past Medical History:  Past Medical History  Diagnosis Date  . Anemia   . Colon polyp   . COPD (chronic obstructive pulmonary disease)     Astmatic bronchitis  . Asthma with bronchitis   . Gout     PMH  . Osteoporosis   . Allergic rhinitis   . Skin cancer, basal cell     Dr Nevada Crane, PMH  . Nephrolithiasis   . HLD (hyperlipidemia)   . Skin cancer (melanoma) 2012    Right neck  . Arthritis     SHOULDER  . Headache(784.0)   . RLS (restless legs syndrome)   . Dysrhythmia 08/2012    NEW ONSET ATRIAL FIBRILATION   Past Surgical History:  Past Surgical History  Procedure Laterality Date  . Finger surgery      for foreign body  . Colonoscopy w/ polypectomy      Dr Deatra Ina  . Inguinal hernia repair    . Nose surgery      Septal Deviation  . Neck surgery  12/2010  Melanoma ; [facility]   . Leg surgery  06/2010     Dr.Lawson, for blood clott. Left leg   . Tee without cardioversion N/A 01/23/2013    Procedure: TRANSESOPHAGEAL ECHOCARDIOGRAM (TEE);  Surgeon:  Jolaine Artist, MD;  Location: [facility] ENDOSCOPY;  Service:  Cardiovascular;  Laterality: N/A;  sign heart cath consent w/tee  consent  . Coronary artery bypass graft N/A 02/20/2013    Procedure: CORONARY ARTERY BYPASS GRAFTING (CABG);  Surgeon:  Ivin Poot, MD;  Location: [place];  Service: Open Heart  Surgery;  Laterality: N/A;  . Aortic valve replacement N/A 02/20/2013    Procedure: AORTIC VALVE REPLACEMENT (AVR);  Surgeon: Ivin Poot, MD;  Location: [place];  Service: Open Heart Surgery;   Laterality: N/A;  . Intraoperative transesophageal echocardiogram N/A 02/20/2013    Procedure: INTRAOPERATIVE TRANSESOPHAGEAL ECHOCARDIOGRAM;   Surgeon: Ivin Poot, MD;  Location: [place];  Service: Open  Heart Surgery;  Laterality: N/A;   HPI:  80 yr old underwent CABG x3 and aortic valve replacement.    Associated small left pleural effusion.  Now with Altered mental  status confusion.  CXR New onset bilateral upper lobe and left  lower lobe  pulmonary infiltrates suggesting pneumonia.  Lung  status worsening and suspicion of aspiration, therefore SLP  recommended MBS to fully assess.     Assessment / Plan / Recommendation Clinical Impression  Dysphagia Diagnosis: Mild oral phase dysphagia;Mild pharyngeal  phase dysphagia;Moderate pharyngeal phase dysphagia Clinical impression: Pt. exhibited mild oral dysphagia with  delayed transit exacerbated by xerostomia and decreased  endurance.  Mild-mod sensory pharyngeal dysphagia with reduced  tongue base retraction and laryngeal elevation resulting in trace  vallecular/pyriform sinus/pharyngeal wall residue.  Observed one  episode of flash laryngeal penetration with thin with increased  work of breathing throughout study.  SLP educated pt. on  importance with clinical reasoning of swallow precautions (do not  eat if increased SOB, frequent rest breaks, small sips, cup sips  preferred).  Recommend continue regular texture, thin liquids  following swallow precautions with continued ST for reiteration  and safety with po's.      Treatment Recommendation  Therapy as outlined in treatment plan below    Diet Recommendation Regular;Thin liquid   Liquid Administration via: Cup;No straw Medication Administration: Whole meds with puree Supervision: Patient able to self feed;Intermittent supervision  to cue for compensatory strategies Compensations: Slow rate;Small sips/bites Postural Changes and/or Swallow Maneuvers: Seated upright 90  degrees;Upright 30-60 min after meal    Other  Recommendations Oral Care Recommendations: Oral care BID   Follow Up Recommendations  None    Frequency and Duration min 2x/week  2 weeks   Pertinent Vitals/Pain WDL         Reason for Referral Objectively evaluate swallowing function   Oral Phase Oral Preparation/Oral Phase Oral Phase: Impaired Oral - Honey Oral - Honey Teaspoon: Delayed oral transit;Weak lingual  manipulation Oral - Nectar Oral - Nectar Cup: Delayed oral transit;Weak lingual  manipulation Oral - Solids Oral - Puree: Delayed oral transit;Weak lingual manipulation   Pharyngeal Phase Pharyngeal Phase Pharyngeal Phase: Impaired Pharyngeal - Honey Pharyngeal - Honey Teaspoon: Pharyngeal residue -  valleculae;Reduced tongue base retraction Pharyngeal - Nectar Pharyngeal - Nectar Teaspoon: Delayed swallow  initiation;Premature spillage to valleculae Pharyngeal - Nectar Cup: Delayed swallow initiation;Premature  spillage to pyriform sinuses;Pharyngeal residue -  valleculae;Reduced tongue base retraction;Premature spillage to  valleculae Pharyngeal - Thin Pharyngeal - Thin Teaspoon: Delayed swallow initiation;Premature  spillage to valleculae Pharyngeal - Thin Cup: Premature spillage to valleculae;Delayed  swallow initiation;Premature spillage to pyriform  sinuses;Pharyngeal residue - valleculae;Reduced tongue base  retraction;Pharyngeal residue - pyriform  sinuses;Penetration/Aspiration during swallow (trace residue) Penetration/Aspiration details (thin cup): Material enters  airway, remains ABOVE vocal cords and not ejected out Pharyngeal - Thin Straw: Delayed swallow initiation;Premature  spillage to pyriform sinuses;Pharyngeal residue -  valleculae;Reduced tongue base retraction Pharyngeal - Solids Pharyngeal - Puree: Pharyngeal residue - valleculae;Reduced  tongue base retraction;Pharyngeal residue - posterior pharnyx  Cervical Esophageal Phase    GO    Cervical Esophageal Phase Cervical Esophageal Phase: Darryll Capers         Cranford Mon.Ed CCC-SLP Pager [phone]  02/26/2013        Assessment & Plan  Principal Problem:   Sepsis Active Problems:   HCAP (healthcare-associated pneumonia)   UTI (lower urinary tract infection)   HYPERLIPIDEMIA   ANEMIA-NOS   HYPERTENSION   ASTHMA   COPD   OSTEOPOROSIS   S/P CABG x 3   Protein-calorie malnutrition, severe   CAD (coronary artery disease)   Atrial fibrillation, controlled   Oral thrush    1. Sepsis. Patient needs sepsis  criteria as he is febrile, tachypneic, and with significant leukocytosis, source of  of sepsis is most likely related to his UTI, and healthcare acquired pneumonia, as well given the fact patient had recent cardiac surgery will send 2 sets of blood cultures  2. Healthcare acquired pneumonia. And clear chest x-ray findings are new or related at 2 previous pneumonia during previous hospitalization, but patient significant leukocytosis indicates sepsis, will start him on IV vancomycin and Zosyn 3-UTI. Patient is on broad-spectrum IV antibiotics 4-A. Fib: Rate controlled, continue with metoprolol and Cardizem, consult pharmacy to dose warfarin 5-history of coronary artery disease: Patient is status post CABG, is on aspirin, statin, and beta blockers, denies any chest pain or shortness of breath 6-history of COPD and asthma: Has no wheezing, continue with Symbicort, and when necessary albuterol 7-hyperlipidemia: Continue with statin 8-oral thrush: Appears to be improving as per wife, continue with Diflucan and Magic mouthwash 9. Anemia: Hemoglobin appears to be improving, continue with iron supplement 10. Osteoporosis. Continue with calcium and vitamin D. Supplement 11. Protein calorie malnutrition> will continue with Ensure   DVT Prophylaxis patient is on warfarin  AM Labs Ordered, also please review Full Orders  Family Communication: Admission, patients condition and plan of care including tests being ordered have been discussed with the patient and wife and son who indicate understanding and agree with the plan and Code Status.  Code Status full  Likely DC to  home  Condition GUARDED  Time spent in minutes :99 min   ELGERGAWY, DAWOOD M.D on 03/13/2013 at 7:22 PM  Between 7am to 7pm -   After 7pm go to [email or web address] - password [email or web address]  And look for the night coverage person covering me after hours  [facility] Office  [phone]

## 2013-03-13 NOTE — ED Notes (Signed)
MD at bedside. 

## 2013-03-14 LAB — COMPREHENSIVE METABOLIC PANEL
ALT: 36 U/L (ref 0–53)
AST: 24 U/L (ref 0–37)
Albumin: 2.1 g/dL — ABNORMAL LOW (ref 3.5–5.2)
Alkaline Phosphatase: 133 U/L — ABNORMAL HIGH (ref 39–117)
BUN: 27 mg/dL — ABNORMAL HIGH (ref 6–23)
CO2: 19 mEq/L (ref 19–32)
Calcium: 8.1 mg/dL — ABNORMAL LOW (ref 8.4–10.5)
Chloride: 102 mEq/L (ref 96–112)
Creatinine, Ser: 1.32 mg/dL (ref 0.50–1.35)
GFR calc Af Amer: 57 mL/min — ABNORMAL LOW (ref >90)
GFR calc non Af Amer: 49 mL/min — ABNORMAL LOW (ref >90)
Glucose, Bld: 268 mg/dL — ABNORMAL HIGH (ref 70–99)
Potassium: 4 mEq/L (ref 3.7–5.3)
Sodium: 135 mEq/L — ABNORMAL LOW (ref 137–147)
Total Bilirubin: 0.4 mg/dL (ref 0.3–1.2)
Total Protein: 6.8 g/dL (ref 6.0–8.3)

## 2013-03-14 LAB — GLUCOSE, CAPILLARY: Glucose-Capillary: 139 mg/dL — ABNORMAL HIGH (ref 70–99)

## 2013-03-14 LAB — APTT: aPTT: 54 seconds — ABNORMAL HIGH (ref 24–37)

## 2013-03-14 LAB — PROTIME-INR
INR: 2.24 — ABNORMAL HIGH (ref 0.00–1.49)
Prothrombin Time: 24.1 seconds — ABNORMAL HIGH (ref 11.6–15.2)

## 2013-03-14 MED ORDER — RESOURCE THICKENUP CLEAR PO POWD
ORAL | Status: DC | PRN
  Filled 2013-03-14: qty 125

## 2013-03-14 MED ORDER — POLYETHYLENE GLYCOL 3350 17 G PO PACK
17.0000 g | PACK | Freq: Every day | ORAL | Status: DC
  Administered 2013-03-14 – 2013-03-16 (×2): 17 g via ORAL
  Filled 2013-03-14 (×4): qty 1

## 2013-03-14 MED ORDER — PRAVASTATIN SODIUM 40 MG PO TABS
40.0000 mg | ORAL_TABLET | Freq: Every day | ORAL | Status: DC
  Administered 2013-03-14 – 2013-03-16 (×3): 40 mg via ORAL
  Filled 2013-03-14 (×4): qty 1

## 2013-03-14 NOTE — Evaluation (Signed)
Physical Therapy Evaluation Patient Details Name: Robert Richard MRN: [id number] DOB: 1931-02-02 Today's Date: 03/14/2013 Time: 1194-1740 PT Time Calculation (min): 21 min  PT Assessment / Plan / Recommendation History of Present Illness  Pt readmitted for sepsis due to HCAP and UTI. Experiencing weakness and confusion with fever. Pt recently adm (1/2) for CABG x3 and AVR.Pt developed flu and transferred back to ICU for respiratory distress. PMHx LLE thrombectomy; afib; recent decr exercise tolerance due to CAD, aortic stenosis, COPD  Clinical Impression  Pt presents with generalized weakness, feeing too fatigued to ambulate on eval. Performed stand pivot transfer with RW and +2 assist for safety, pt 70%. Will advance mobility with acute PT as pt tolerates and recommend return to HHPT services at d/c.    PT Assessment  Patient needs continued PT services    Follow Up Recommendations  Home health PT;Supervision/Assistance - 24 hour    Does the patient have the potential to tolerate intense rehabilitation      Barriers to Discharge        Equipment Recommendations  None recommended by PT    Recommendations for Other Services OT consult   Frequency Min 3X/week    Precautions / Restrictions Precautions Precautions: Sternal;Fall Precaution Comments: sternal precautions kept on eval Restrictions Weight Bearing Restrictions: No   Pertinent Vitals/Pain HR 98-114 bpm      Mobility  Bed Mobility Overal bed mobility: Needs Assistance Bed Mobility: Supine to Sit Supine to sit: Min assist General bed mobility comments: vc's for keeping precautions, for rolling before sitting. min A needed for SL to sit Transfers Overall transfer level: Needs assistance Equipment used: Rolling walker (2 wheeled) Transfers: Sit to/from [facility] Sit to Stand: +2 safety/equipment;Min assist Stand pivot transfers: +2 safety/equipment;Mod assist General transfer comment: pt feeling  very weak and unable to ambulate, however, was able to transfer bed to chair with RW and min A, +2 for safety. Pt unsteady with standing and with increased biateral knee flexion Ambulation/Gait General Gait Details: NT due to pt fatigue    Exercises General Exercises - Lower Extremity Ankle Circles/Pumps: AROM;Both;10 reps;Seated Long Arc Quad: AROM;Both;10 reps;Seated Hip ABduction/ADduction: AROM;Both;10 reps;Seated Straight Leg Raises: AROM;10 reps;Both;Seated   PT Diagnosis: Difficulty walking;Generalized weakness  PT Problem List: Decreased strength;Decreased activity tolerance;Decreased balance;Decreased mobility;Cardiopulmonary status limiting activity;Decreased knowledge of precautions PT Treatment Interventions: DME instruction;Gait training;Stair training;Functional mobility training;Therapeutic activities;Therapeutic exercise;Balance training;Patient/family education     PT Goals(Current goals can be found in the care plan section) Acute Rehab PT Goals Patient Stated Goal: regain independence PT Goal Formulation: With patient Time For Goal Achievement: 03/28/13 Potential to Achieve Goals: Good  Visit Information  Last PT Received On: 03/14/13 Assistance Needed: +2 (safety) History of Present Illness: Pt readmitted for sepsis due to HCAP and UTI. Experiencing weakness and confusion with fever. Pt recently adm (1/2) for CABG x3 and AVR.Pt developed flu and transferred back to ICU for respiratory distress. PMHx LLE thrombectomy; afib; recent decr exercise tolerance due to CAD, aortic stenosis, COPD       Prior Functioning  Home Living Family/patient expects to be discharged to:: Private residence Living Arrangements: Spouse/significant other Available Help at Discharge: Family;Available 24 hours/day (son local but works full time) Type of Home: House Home Access: Stairs to enter [employer] of Steps: 3 Entrance Stairs-Rails: Left Home Layout:  Multi-level;Laundry or work area in basement;Able to live on main level with bedroom/bathroom Home Equipment: Kasandra Knudsen - single point;Walker - 2 wheels Additional Comments: pt reports  he has been mobilizing well with wife after CABG Prior Function Level of Independence: Needs assistance Gait / Transfers Assistance Needed: since CABG, supervision ADL's / Homemaking Assistance Needed: asistance since CABG Comments: active up until 2 months ago;  Communication Communication: No difficulties    Cognition  Cognition Arousal/Alertness: Awake/alert Behavior During Therapy: WFL for tasks assessed/performed Overall Cognitive Status: Within Functional Limits for tasks assessed    Extremity/Trunk Assessment Upper Extremity Assessment Upper Extremity Assessment: Generalized weakness Lower Extremity Assessment Lower Extremity Assessment: Generalized weakness Cervical / Trunk Assessment Cervical / Trunk Assessment: Kyphotic   Balance Balance Overall balance assessment: Needs assistance Standing balance support: During functional activity;Bilateral upper extremity supported Standing balance-Leahy Scale: Poor Standing balance comment: requiring bilateral UE support due to generalized weakness  End of Session PT - End of Session Equipment Utilized During Treatment: Gait belt Activity Tolerance: Patient limited by fatigue Patient left: in chair;with call bell/phone within reach Nurse Communication: Mobility status  GP   Leighton Roach, [place]  [place], [place], [place] 03/14/2013, 11:42 AM

## 2013-03-14 NOTE — Progress Notes (Signed)
[place] for Coumadin Indication: atrial fibrillation  Allergies  Allergen Reactions  . Ivp Dye [Iodinated Diagnostic Agents] Hives   Labs:  Recent Labs  03/13/13 1700 03/13/13 1840 03/14/13 0411  HGB 10.3*  --   --   HCT 30.2*  --   --   PLT 377  --   --   APTT  --   --  54*  LABPROT  --  19.2* 24.1*  INR  --  1.67* 2.24*  CREATININE 1.32  --  1.32  TROPONINI <0.30  --   --     Estimated Creatinine Clearance: 39.1 ml/min (by C-G formula based on Cr of 1.32).   Assessment: 39 YOM with CABG on 05/25/48 that was complicated by pneumonia and influenza. He was just discharged 4 days ago after a prolonged hospital stay. He returns today with increased lethargy, weakness, and fever.  Continues home Coumadin for Afib  Home dose is Coumadin 1 mg on Mon, Wed, Fri, Sat - last dose 1/23 per wife  INR today = 2.24 (up from 1.67)  Goal of Therapy:  INR 2-3 Monitor platelets by anticoagulation protocol: Yes   Plan:  -No Coumadin today -Follow up AM INR  Thank you. Anette Guarneri, PharmD [phone] 03/14/2013 10:47 AM

## 2013-03-15 LAB — BASIC METABOLIC PANEL
BUN: 22 mg/dL (ref 6–23)
CO2: 20 mEq/L (ref 19–32)
Calcium: 8.3 mg/dL — ABNORMAL LOW (ref 8.4–10.5)
Chloride: 103 mEq/L (ref 96–112)
Creatinine, Ser: 1.19 mg/dL (ref 0.50–1.35)
GFR calc Af Amer: 65 mL/min — ABNORMAL LOW (ref >90)
GFR calc non Af Amer: 56 mL/min — ABNORMAL LOW (ref >90)
Glucose, Bld: 137 mg/dL — ABNORMAL HIGH (ref 70–99)
Potassium: 4.3 mEq/L (ref 3.7–5.3)
Sodium: 135 mEq/L — ABNORMAL LOW (ref 137–147)

## 2013-03-15 LAB — GLUCOSE, CAPILLARY
Glucose-Capillary: 147 mg/dL — ABNORMAL HIGH (ref 70–99)
Glucose-Capillary: 145 mg/dL — ABNORMAL HIGH (ref 70–99)

## 2013-03-15 LAB — HEMOGLOBIN A1C
Hgb A1c MFr Bld: 6.4 % — ABNORMAL HIGH (ref <5.7)
Mean Plasma Glucose: 137 mg/dL — ABNORMAL HIGH (ref <117)

## 2013-03-15 LAB — PROTIME-INR
INR: 1.93 — ABNORMAL HIGH (ref 0.00–1.49)
Prothrombin Time: 21.5 seconds — ABNORMAL HIGH (ref 11.6–15.2)

## 2013-03-15 MED ORDER — WARFARIN SODIUM 1 MG PO TABS
1.0000 mg | ORAL_TABLET | Freq: Once | ORAL | Status: AC
  Administered 2013-03-15: 1 mg via ORAL
  Filled 2013-03-15: qty 1

## 2013-03-15 MED ORDER — INSULIN ASPART 100 UNIT/ML ~~LOC~~ SOLN: 0.0000 [IU] | Freq: Every day | SUBCUTANEOUS | Status: DC

## 2013-03-15 MED ORDER — INSULIN ASPART 100 UNIT/ML ~~LOC~~ SOLN
0.0000 [IU] | Freq: Three times a day (TID) | SUBCUTANEOUS | Status: DC
  Administered 2013-03-15: 1 [IU] via SUBCUTANEOUS
  Administered 2013-03-16 (×2): 2 [IU] via SUBCUTANEOUS

## 2013-03-15 NOTE — Evaluation (Signed)
Clinical/Bedside Swallow Evaluation Patient Details  Name: Robert GAUTHREAUX MRN: [id number] Date of Birth: Jul 17, 1930  Today's Date: 03/15/2013 Time: 1400-1430 SLP Time Calculation (min): 30 min  Past Medical History:  Past Medical History  Diagnosis Date  . Anemia   . Colon polyp   . COPD (chronic obstructive pulmonary disease)     Astmatic bronchitis  . Asthma with bronchitis   . Gout     PMH  . Osteoporosis   . Allergic rhinitis   . Skin cancer, basal cell     Dr [place] Crane, PMH  . Nephrolithiasis   . HLD (hyperlipidemia)   . Skin cancer (melanoma) 2012    Right neck  . Arthritis     SHOULDER  . Headache(784.0)   . RLS (restless legs syndrome)   . Dysrhythmia 08/2012    NEW ONSET ATRIAL FIBRILATION   Past Surgical History:  Past Surgical History  Procedure Laterality Date  . Finger surgery      for foreign body  . Colonoscopy w/ polypectomy      Dr Deatra Ina  . Inguinal hernia repair    . Nose surgery      Septal Deviation  . Neck surgery  12/2010    Melanoma ; [facility]   . Leg surgery  06/2010     Dr.Lawson, for blood clott. Left leg   . Tee without cardioversion N/A 01/23/2013    Procedure: TRANSESOPHAGEAL ECHOCARDIOGRAM (TEE);  Surgeon: Jolaine Artist, MD;  Location: [facility] ENDOSCOPY;  Service: Cardiovascular;  Laterality: N/A;  sign heart cath consent w/tee consent  . Coronary artery bypass graft N/A 02/20/2013    Procedure: CORONARY ARTERY BYPASS GRAFTING (CABG);  Surgeon: Ivin Poot, MD;  Location: [place];  Service: Open Heart Surgery;  Laterality: N/A;  . Aortic valve replacement N/A 02/20/2013    Procedure: AORTIC VALVE REPLACEMENT (AVR);  Surgeon: Ivin Poot, MD;  Location: [place];  Service: Open Heart Surgery;  Laterality: N/A;  . Intraoperative transesophageal echocardiogram N/A 02/20/2013    Procedure: INTRAOPERATIVE TRANSESOPHAGEAL ECHOCARDIOGRAM;  Surgeon: Ivin Poot, MD;  Location: [place];  Service: Open Heart Surgery;  Laterality: N/A;   HPI:     Acute metabolic encephalopathy secondary to UTI. Appears to be much improved and close to baseline, continue empiric antibiotics for UTI and presumed HCAP.  MBS completed 02/26/13: Mild oral phase with mild to moderate pharyngeal phase dysphagia with diet recommendations of regular consistency and thin liquids.    BSE ordered to assess risk for aspiration and recommend safest, PO diet.      Assessment / Plan / Recommendation Clinical Impression  BSE completed.   Slight increase WOB s/p trials but no outward clinical s/s of aspiration noted with PO trials throughout evaluation. Spouse present during evaluation reports patient with decreased appetite and  preferring to eat soft solids or textures easier to swallow (taking medication for thrush).    Recommend to continue current diet consistency of dysphagia 2 as tolerated and advance liquids to thin.  Recommend full supervision with all meals to assist PRN due to present decreased reserve.  Advance solids as tolerated. ST to follow x1 in acute care setting for diet tolerance to ensure safety due to current respiratory status and PMH for dysphagia.      Aspiration Risk  Mild    Diet Recommendation Dysphagia 2 (Fine chop);Thin liquid   Liquid Administration via: Cup;No straw Medication Administration: Whole meds with puree Supervision: Patient able to self feed;Full supervision/cueing for  compensatory strategies Compensations: Small sips/bites;Follow solids with liquid;Effortful swallow Postural Changes and/or Swallow Maneuvers: Out of bed for meals;Upright 30-60 min after meal    Other  Recommendations Oral Care Recommendations: Oral care Q4 per protocol   Follow Up Recommendations  None    Frequency and Duration min 1 x/week  2 weeks       SLP Swallow Goals Please refer to Care Plan for listed goals  Swallow Study Prior Functional Status   Tolerating mechanical soft solids and thin liquids    General Date of Onset: 03/13/13 Type  of Study: Bedside swallow evaluation Previous Swallow Assessment: MBS 02/26/13 regular consistency and thin liquids Diet Prior to this Study: Dysphagia 2 (chopped);Nectar-thick liquids Temperature Spikes Noted: No Respiratory Status: Nasal cannula History of Recent Intubation: No Behavior/Cognition: Alert;Cooperative;Pleasant mood;Requires cueing Oral Cavity - Dentition: Adequate natural dentition Self-Feeding Abilities: Able to feed self;Needs set up Patient Positioning: Upright in bed Baseline Vocal Quality: Clear Volitional Cough: Strong Volitional Swallow: Able to elicit    Oral/Motor/Sensory Function Overall Oral Motor/Sensory Function: Appears within functional limits for tasks assessed   Ice Chips Ice chips: Within functional limits   Thin Liquid Thin Liquid: Within functional limits Presentation: Cup    Nectar Thick Nectar Thick Liquid: Not tested   Honey Thick Honey Thick Liquid: Not tested   Puree Puree: Within functional limits   Solid   GO    Solid: Within functional limits      Sharman Crate [place], [place] [facility] 03/15/2013,3:06 PM

## 2013-03-15 NOTE — Progress Notes (Addendum)
Patient Demographics  Robert Richard, is a 80 y.o. male, DOB - Jul 31, 1930, EQA:[id number]  Admit date - 03/13/2013   Admitting Physician Phillips Climes, MD  Outpatient Primary MD for the patient is Unice Cobble, MD  LOS - 2   Chief Complaint  Patient presents with  . Altered Mental Status        Assessment & Plan    1. Acute metabolic encephalopathy secondary to UTI. Appears to be much improved and close to baseline, continue empiric antibiotics for UTI and presumed HCAP.    2. UTI and HCAP. Does not have a cough, currently afebrile, more consistent with UTI, will repeat 2 view chest x-ray in the morning, if there is no acute infiltrate vancomycin can be stopped and Zosyn can be titrated down to Rocephin. Follow blood and urine culture results.    3. Recent CABG and aortic valve replacement (tissue valve) - sternotomy wound looks clean, no chest pain, will monitor blood cultures, continue aspirin, beta blocker and Coumadin under pharmacy guidance.    4. Atrial fibrillation. Goal will be rate controlled on comminution of calcium channel blocker and beta blocker, Coumadin per pharmacy.    5. Dyslipidemia continue on statin.    6. Severe generalized weakness, severe protein calorie malnutrition and cachexia. Place on protein supplementation, PT evaluation, will require SNF placement.    7. Oral thrush. On antifungal.    8. History of COPD and asthma. No acute issues nebulizer treatments and oxygen as needed.    9. Leukocytosis secondary to UTI/HCAP. Repeat CBC in the morning. Follow blood & urine cultures .    10.AOCD - stable.    11. Have noticed elevated sugars on BMP. No history of diabetes, check A1c placed on sliding scale and monitor.       Code Status:  Full  Family Communication:    Disposition Plan: SNF   Procedures CT Head, CXR   Consults     Medications  Scheduled Meds: . aspirin EC  81 mg Oral Daily  . budesonide-formoterol  2 puff Inhalation BID  . busPIRone  7.5 mg Oral BID  . calcium-vitamin D  1 tablet Oral Daily  . cholecalciferol  1,000 Units Oral Daily  . diltiazem  240 mg Oral Daily  . famotidine  20 mg Oral Daily  . feeding supplement (ENSURE COMPLETE)  237 mL Oral BID BM  . ferrous sulfate  325 mg Oral Q breakfast  . fluconazole  50 mg Oral Daily  . fluticasone  1 spray Each Nare Daily  . ketotifen  1 drop Both Eyes Daily  . loratadine  10 mg Oral Daily  . metoprolol tartrate  25 mg Oral BID  . multivitamin with minerals  1 tablet Oral Daily  . nystatin  5 mL Oral QID  . piperacillin-tazobactam (ZOSYN)  IV  3.375 g Intravenous Q8H  . polyethylene glycol  17 g Oral Daily  . pravastatin  40 mg Oral q1800  . sodium chloride  3 mL Intravenous Q12H  . topiramate  75 mg Oral Daily  . vancomycin  500 mg Intravenous Q12H  . Warfarin - Pharmacist Dosing Inpatient   Does not apply q1800   Continuous Infusions:  PRN Meds:.acetaminophen, acetaminophen, albuterol, bisacodyl, Lidocaine HCl,  nitroGLYCERIN, oxyCODONE, RESOURCE THICKENUP CLEAR, traMADol  DVT Prophylaxis  Couamdin  Lab Results  Component Value Date   INR 1.93* 03/15/2013   INR 2.24* 03/14/2013   INR 1.67* 03/13/2013     Lab Results  Component Value Date   PLT 377 03/13/2013    Antibiotics     Anti-infectives   Start     Dose/Rate Route Frequency Ordered Stop   03/14/13 1000  fluconazole (DIFLUCAN) tablet 50 mg     50 mg Oral Daily 03/13/13 2027     03/14/13 0800  vancomycin (VANCOCIN) 500 mg in sodium chloride 0.9 % 100 mL IVPB     500 mg 100 mL/hr over 60 Minutes Intravenous Every 12 hours 03/13/13 2028     03/14/13 0400  piperacillin-tazobactam (ZOSYN) IVPB 3.375 g     3.375 g 12.5 mL/hr over 240 Minutes Intravenous Every 8 hours  03/13/13 2028     03/13/13 1830  vancomycin (VANCOCIN) IVPB 1000 mg/200 mL premix     1,000 mg 200 mL/hr over 60 Minutes Intravenous  Once 03/13/13 1824 03/14/13 0335   03/13/13 1830  piperacillin-tazobactam (ZOSYN) IVPB 3.375 g     3.375 g 100 mL/hr over 30 Minutes Intravenous  Once 03/13/13 1824 03/13/13 2031          Subjective:   [employer] today has, No headache, No chest pain, No abdominal pain - No Nausea, No new weakness tingling or numbness, No Cough - SOB.  Feels weak all over  Objective:   Filed Vitals:   03/14/13 2157 03/14/13 2215 03/15/13 0533 03/15/13 0855  BP: 103/61  110/51   Pulse: 82  71   Temp:   97.4 F (36.3 C)   TempSrc:   Oral   Resp:   20   Height:      Weight:      SpO2:  95% 99% 97%    Wt Readings from Last 3 Encounters:  03/13/13 61.9 kg (136 lb 7.4 oz)  03/09/13 62.869 kg (138 lb 9.6 oz)  03/09/13 62.869 kg (138 lb 9.6 oz)     Intake/Output Summary (Last 24 hours) at 03/15/13 1009 Last data filed at 03/15/13 0700  Gross per 24 hour  Intake 1273.75 ml  Output    400 ml  Net 873.75 ml    Exam Awake Alert, Oriented X 3, No new F.N deficits, Normal affect [place].AT,PERRAL Supple Neck,No JVD, No cervical lymphadenopathy appriciated.  Symmetrical Chest wall movement, Good air movement bilaterally, CTAB RRR,No Gallops,Rubs , aortic systolic Murmur, No Parasternal Heave +ve B.Sounds, Abd Soft, Non tender, No organomegaly appriciated, No rebound - guarding or rigidity. No Cyanosis, Clubbing or edema, No new Rash or bruise      Data Review   Micro Results Recent Results (from the past 240 hour(s))  URINE CULTURE     Status: None   Collection Time    03/13/13  5:28 PM      Result Value Range Status   Specimen Description URINE, CLEAN CATCH   Final   Special Requests NONE   Final   Culture  Setup Time     Final   Value: 03/13/2013 19:05     Performed at [place]     Final   Value: >=100,000 COLONIES/ML      Performed at [facility]   Culture     Final   Value: [place]     Performed at [facility]  Report Status PENDING   Incomplete    Radiology Reports Dg Chest 2 View  03/13/2013   CLINICAL DATA:  Altered mental status, asthma  EXAM: CHEST  2 VIEW  COMPARISON:  03/06/2013  FINDINGS: Cardiomediastinal silhouette is stable. Osteopenia and degenerative changes thoracic spine again noted. Stable compression deformities thoracic spine.  Status post median sternotomy. There is small left pleural effusion with streaky left basilar atelectasis or infiltrate. No pulmonary edema. Atherosclerotic calcifications of thoracic aorta again noted.  IMPRESSION: Small left pleural effusion with streaky left basilar atelectasis or infiltrate. No pulmonary edema. Status post median sternotomy.   Electronically Signed   By: Lahoma Crocker M.D.   On: 03/13/2013 16:17    Ct Head Wo Contrast  03/13/2013   CLINICAL DATA:  Altered mental status. Recent cardiac bypass surgery. The patient was discharged to the nursing home yesterday.  EXAM: CT HEAD WITHOUT CONTRAST  TECHNIQUE: Contiguous axial images were obtained from the base of the skull through the vertex without intravenous contrast.  COMPARISON:  CT head without contrast 03/02/2013.  FINDINGS: Remote lacunar infarcts are again noted within the basal ganglia and scratch the remote lacunar infarcts are again noted within the cerebellum. No acute cortical infarct, hemorrhage, or mass lesion is present. The ventricles are proportionate to the degree of atrophy and stable. No significant extra-axial fluid collection is present.  The paranasal sinuses and mastoid air cells are clear. The osseous skull is intact.  IMPRESSION: 1. No acute intracranial abnormality or significant interval change. 2. Stable remote lacunar infarcts of the cerebellum.   Electronically Signed   By: Lawrence Santiago M.D.   On: 03/13/2013 16:03     CBC  Recent Labs Lab  03/13/13 1700  WBC 21.2*  HGB 10.3*  HCT 30.2*  PLT 377  MCV 85.8  MCH 29.3  MCHC 34.1  RDW 14.1  LYMPHSABS 1.1  MONOABS 1.3*  EOSABS 0.0  BASOSABS 0.0    Chemistries   Recent Labs Lab 03/13/13 1700 03/14/13 0411  NA 132* 135*  K 4.6 4.0  CL 96 102  CO2 20 19  GLUCOSE 131* 268*  BUN 29* 27*  CREATININE 1.32 1.32  CALCIUM 8.6 8.1*  AST 28 24  ALT 44 36  ALKPHOS 160* 133*  BILITOT 0.5 0.4   ------------------------------------------------------------------------------------------------------------------ estimated creatinine clearance is 39.1 ml/min (by C-G formula based on Cr of 1.32). ------------------------------------------------------------------------------------------------------------------ No results found for this basename: HGBA1C,  in the last 72 hours ------------------------------------------------------------------------------------------------------------------ No results found for this basename: CHOL, HDL, LDLCALC, TRIG, CHOLHDL, LDLDIRECT,  in the last 72 hours ------------------------------------------------------------------------------------------------------------------ No results found for this basename: TSH, T4TOTAL, FREET3, T3FREE, THYROIDAB,  in the last 72 hours ------------------------------------------------------------------------------------------------------------------ No results found for this basename: VITAMINB12, FOLATE, FERRITIN, TIBC, IRON, RETICCTPCT,  in the last 72 hours  Coagulation profile  Recent Labs Lab 03/09/13 0522 03/13/13 1840 03/14/13 0411 03/15/13 0345  INR 2.55* 1.67* 2.24* 1.93*    No results found for this basename: DDIMER,  in the last 72 hours  Cardiac Enzymes  Recent Labs Lab 03/13/13 1700  TROPONINI <0.30   ------------------------------------------------------------------------------------------------------------------ No components found with this basename: POCBNP,      Time Spent in  minutes  35   SINGH,PRASHANT K M.D on 03/15/2013 at 10:09 AM  Between 7am to 7pm - Pager - [phone]  After 7pm go to [email or web address] - password [email or web address]  And look for the night coverage person covering for me after hours  [facility] Office  [phone]

## 2013-03-15 NOTE — Progress Notes (Signed)
[place] for Coumadin Indication: atrial fibrillation  Allergies  Allergen Reactions  . Ivp Dye [Iodinated Diagnostic Agents] Hives   Labs:  Recent Labs  03/13/13 1700 03/13/13 1840 03/14/13 0411 03/15/13 0345 03/15/13 0802  HGB 10.3*  --   --   --   --   HCT 30.2*  --   --   --   --   PLT 377  --   --   --   --   APTT  --   --  54*  --   --   LABPROT  --  19.2* 24.1* 21.5*  --   INR  --  1.67* 2.24* 1.93*  --   CREATININE 1.32  --  1.32  --  1.19  TROPONINI <0.30  --   --   --   --     Estimated Creatinine Clearance: 43.3 ml/min (by C-G formula based on Cr of 1.19).   Assessment: 64 YOM with CABG on 08/24/52 that was complicated by pneumonia and influenza. He was just discharged 4 days ago after a prolonged hospital stay. He returns today with increased lethargy, weakness, and fever.  Continues home Coumadin for Afib  Home dose is Coumadin 1 mg on Mon, Wed, Fri, Sat - last dose 1/23 per wife  INR today = 1.93 (down from 2.24))  Goal of Therapy:  INR 2-3 Monitor platelets by anticoagulation protocol: Yes   Plan:  -Coumadin 1 mg po x 1 today -Follow up AM INR  Thank you. Anette Guarneri, PharmD [phone] 03/15/2013 10:50 AM

## 2013-03-16 ENCOUNTER — Inpatient Hospital Stay (HOSPITAL_COMMUNITY): Payer: Medicare Other

## 2013-03-16 LAB — CBC
HCT: 25.5 % — ABNORMAL LOW (ref 39.0–52.0)
Hemoglobin: 8.3 g/dL — ABNORMAL LOW (ref 13.0–17.0)
MCH: 28.6 pg (ref 26.0–34.0)
MCHC: 32.5 g/dL (ref 30.0–36.0)
MCV: 87.9 fL (ref 78.0–100.0)
Platelets: 267 10*3/uL (ref 150–400)
RBC: 2.9 MIL/uL — ABNORMAL LOW (ref 4.22–5.81)
RDW: 14.2 % (ref 11.5–15.5)
WBC: 8.6 10*3/uL (ref 4.0–10.5)

## 2013-03-16 LAB — BASIC METABOLIC PANEL
BUN: 23 mg/dL (ref 6–23)
CO2: 20 mEq/L (ref 19–32)
Calcium: 8.3 mg/dL — ABNORMAL LOW (ref 8.4–10.5)
Chloride: 103 mEq/L (ref 96–112)
Creatinine, Ser: 1.18 mg/dL (ref 0.50–1.35)
GFR calc Af Amer: 65 mL/min — ABNORMAL LOW (ref >90)
GFR calc non Af Amer: 56 mL/min — ABNORMAL LOW (ref >90)
Glucose, Bld: 136 mg/dL — ABNORMAL HIGH (ref 70–99)
Potassium: 4.5 mEq/L (ref 3.7–5.3)
Sodium: 136 mEq/L — ABNORMAL LOW (ref 137–147)

## 2013-03-16 LAB — GLUCOSE, CAPILLARY
Glucose-Capillary: 112 mg/dL — ABNORMAL HIGH (ref 70–99)
Glucose-Capillary: 150 mg/dL — ABNORMAL HIGH (ref 70–99)
Glucose-Capillary: 159 mg/dL — ABNORMAL HIGH (ref 70–99)
Glucose-Capillary: 148 mg/dL — ABNORMAL HIGH (ref 70–99)

## 2013-03-16 LAB — PROTIME-INR
INR: 1.78 — ABNORMAL HIGH (ref 0.00–1.49)
Prothrombin Time: 20.2 seconds — ABNORMAL HIGH (ref 11.6–15.2)

## 2013-03-16 MED ORDER — WARFARIN SODIUM 2 MG PO TABS
2.0000 mg | ORAL_TABLET | Freq: Once | ORAL | Status: AC
  Administered 2013-03-16: 2 mg via ORAL
  Filled 2013-03-16: qty 1

## 2013-03-16 NOTE — Progress Notes (Signed)
Physical Therapy Treatment Patient Details Name: Robert Richard MRN: [id number] DOB: 1930/12/16 Today's Date: 03/16/2013 Time: 8850-2774 PT Time Calculation (min): 23 min  PT Assessment / Plan / Recommendation  History of Present Illness Pt readmitted for sepsis due to HCAP and UTI. Experiencing weakness and confusion with fever. Pt recently adm (1/2) for CABG x3 and AVR.Pt developed flu and transferred back to ICU for respiratory distress. PMHx LLE thrombectomy; afib; recent decr exercise tolerance due to CAD, aortic stenosis, COPD   PT Comments   Much improved, mildly unsteady but safe with RW  Follow Up Recommendations  Home health PT;Supervision/Assistance - 24 hour     Does the patient have the potential to tolerate intense rehabilitation     Barriers to Discharge        Equipment Recommendations  None recommended by PT    Recommendations for Other Services OT consult  Frequency Min 3X/week   Progress towards PT Goals Progress towards PT goals: Progressing toward goals  Plan Current plan remains appropriate    Precautions / Restrictions Precautions Precautions: Sternal;Fall Precaution Comments: sternal precautions kept on eval Restrictions Weight Bearing Restrictions: No Other Position/Activity Restrictions: Sternal precautions   Pertinent Vitals/Pain     Mobility  Transfers Overall transfer level: Needs assistance Equipment used: Rolling walker (2 wheeled) Transfers: Sit to/from Stand Sit to Stand: Min assist General transfer comment: Much better transfer, but needs assist to come forward.  cues to remember sternal precautions Ambulation/Gait Ambulation/Gait assistance: Min guard Ambulation Distance (Feet): 600 Feet (plus) Assistive device: Rolling walker (2 wheeled), none for approx. 50 feet Gait Pattern/deviations: Step-through pattern;Drifts right/left (mildly staggered bil at times) Gait velocity: gait speed variable and age appropriate Gait velocity  interpretation: at or above normal speed for age/gender Stairs: Yes Stairs assistance: Min assist Stair Management: One rail Right;Alternating pattern;Forwards Number of Stairs: 3    Exercises     PT Diagnosis:    PT Problem List:   PT Treatment Interventions:     PT Goals (current goals can now be found in the care plan section) Acute Rehab PT Goals Patient Stated Goal: regain independence PT Goal Formulation: With patient Time For Goal Achievement: 03/28/13 Potential to Achieve Goals: Good  Visit Information  Last PT Received On: 03/16/13 Assistance Needed: +1 (safety) History of Present Illness: Pt readmitted for sepsis due to HCAP and UTI. Experiencing weakness and confusion with fever. Pt recently adm (1/2) for CABG x3 and AVR.Pt developed flu and transferred back to ICU for respiratory distress. PMHx LLE thrombectomy; afib; recent decr exercise tolerance due to CAD, aortic stenosis, COPD    Subjective Data  Subjective: Nope, I'm not tired, let's keep going Patient Stated Goal: regain independence   Cognition  Cognition Arousal/Alertness: Awake/alert Behavior During Therapy: WFL for tasks assessed/performed Overall Cognitive Status: Within Functional Limits for tasks assessed    Balance  Balance Overall balance assessment: Needs assistance Sitting balance-Leahy Scale: Good Standing balance support: Bilateral upper extremity supported;No upper extremity supported Standing balance-Leahy Scale: Fair  End of Session PT - End of Session Activity Tolerance: Patient tolerated treatment well Patient left: in chair;with call bell/phone within reach;with family/visitor present Nurse Communication: Mobility status   GP     Robert Richard, Robert Richard 03/16/2013, 5:47 PM

## 2013-03-16 NOTE — Progress Notes (Addendum)
PATIENT DETAILS Name: Robert Richard Age: 80 y.o. Sex: male Date of Birth: 1930/07/24 Admit Date: 03/13/2013 Admitting Physician Phillips Climes, MD [id number] Linna Darner, MD  Subjective: Feels much better. Completely awake and alert.  Assessment/Plan: Acute metabolic encephalopathy  -resolved -secondary to UTI -CT head neg on 1/23  UTI -prelim Urine c/x positive for enterobacter -afebrile, leukocytosis has resolved -on Zosyn-since 1/23-will transition to oral antibiotics depending on culture/sensitivity  Suspected HCAP -afebrile, leukocytosis has resolved.Encephalopathy resolved -on Zosyn-since 1/23-will transition to oral antibiotics on discharge  Oral thrush -on Difulcan since 1/23-plan on 10 day course  Anemia -secondary to recent illness/acute illness -no overt evidence of blood loss -c/w Fe supplementation -monitor Hb periodically  Atrial fibrillation.  -rate controlled with cardizem and metoprolol -Coumadin per pharmacy.  Hx of CAD-Recent CABG and aortic valve replacement (tissue valve) on 02/20/13 -stable-sternotomy wound looks clean, no chest pain -on ASA, Statins and Metoprolol -coumadin per pharmacy  Dysphagia -suspect 2/2 deconditioning -SLP recommending Dys 2 diet, spoke with spouse at bedside on 1/26-willing to accept risk of aspiration -will require outpatient SLP eval-will have case management arrange for home health services  History of COPD/Asthma -lungs clear -continue with Symbicort  Dyslipidemia  -continue on statin  DM -c/w SSI -A1C on 1/25-6.4  Disposition: Remain inpatient  DVT Prophylaxis: On coumadin  Code Status: Full code   Family Communication Spouse at bedside  Procedures:  None  CONSULTS:  None  Time spent 40 minutes-which includes 50% of the time with face-to-face with patient/ family and coordinating care related to the above assessment and plan.    MEDICATIONS: Scheduled Meds: . aspirin EC  81  mg Oral Daily  . budesonide-formoterol  2 puff Inhalation BID  . busPIRone  7.5 mg Oral BID  . calcium-vitamin D  1 tablet Oral Daily  . cholecalciferol  1,000 Units Oral Daily  . diltiazem  240 mg Oral Daily  . famotidine  20 mg Oral Daily  . feeding supplement (ENSURE COMPLETE)  237 mL Oral BID BM  . ferrous sulfate  325 mg Oral Q breakfast  . fluconazole  50 mg Oral Daily  . fluticasone  1 spray Each Nare Daily  . insulin aspart  0-5 Units Subcutaneous QHS  . insulin aspart  0-9 Units Subcutaneous TID WC  . ketotifen  1 drop Both Eyes Daily  . loratadine  10 mg Oral Daily  . metoprolol tartrate  25 mg Oral BID  . multivitamin with minerals  1 tablet Oral Daily  . nystatin  5 mL Oral QID  . piperacillin-tazobactam (ZOSYN)  IV  3.375 g Intravenous Q8H  . polyethylene glycol  17 g Oral Daily  . pravastatin  40 mg Oral q1800  . sodium chloride  3 mL Intravenous Q12H  . topiramate  75 mg Oral Daily  . vancomycin  500 mg Intravenous Q12H  . Warfarin - Pharmacist Dosing Inpatient   Does not apply q1800   Continuous Infusions:  PRN Meds:.acetaminophen, acetaminophen, albuterol, bisacodyl, Lidocaine HCl, nitroGLYCERIN, oxyCODONE, RESOURCE THICKENUP CLEAR, traMADol  Antibiotics: Anti-infectives   Start     Dose/Rate Route Frequency Ordered Stop   03/14/13 1000  fluconazole (DIFLUCAN) tablet 50 mg     50 mg Oral Daily 03/13/13 2027     03/14/13 0800  vancomycin (VANCOCIN) 500 mg in sodium chloride 0.9 % 100 mL IVPB     500 mg 100 mL/hr over 60 Minutes Intravenous Every 12 hours 03/13/13 2028  03/14/13 0400  piperacillin-tazobactam (ZOSYN) IVPB 3.375 g     3.375 g 12.5 mL/hr over 240 Minutes Intravenous Every 8 hours 03/13/13 2028     03/13/13 1830  vancomycin (VANCOCIN) IVPB 1000 mg/200 mL premix     1,000 mg 200 mL/hr over 60 Minutes Intravenous  Once 03/13/13 1824 03/14/13 0335   03/13/13 1830  piperacillin-tazobactam (ZOSYN) IVPB 3.375 g     3.375 g 100 mL/hr over 30  Minutes Intravenous  Once 03/13/13 1824 03/13/13 2031       PHYSICAL EXAM: Vital signs in last 24 hours: Filed Vitals:   03/15/13 2131 03/16/13 0506 03/16/13 1008 03/16/13 1009  BP: 118/57 107/63 118/74   Pulse: 85 69 71   Temp: 98.4 F (36.9 C) 97.7 F (36.5 C)    TempSrc: Oral Oral    Resp: 18 18    Height:      Weight:      SpO2: 99% 98%  97%    Weight change:  Filed Weights   03/13/13 1921 03/13/13 2031  Weight: 62.9 kg (138 lb 10.7 oz) 61.9 kg (136 lb 7.4 oz)   Body mass index is 18.09 kg/(m^2).   Gen Exam: Awake and alert with clear speech.   Neck: Supple, No JVD.   Chest: B/L Clear.   CVS: S1 S2 Regular, no murmurs.  Abdomen: soft, BS +, non tender, non distended.  Extremities: no edema, lower extremities warm to touch Neurologic: Non Focal.   Skin: No Rash.   Wounds: N/A.    Intake/Output from previous day:  Intake/Output Summary (Last 24 hours) at 03/16/13 1102 Last data filed at 03/16/13 0816  Gross per 24 hour  Intake    460 ml  Output   1075 ml  Net   -615 ml     LAB RESULTS: CBC  Recent Labs Lab 03/13/13 1700 03/16/13 0335  WBC 21.2* 8.6  HGB 10.3* 8.3*  HCT 30.2* 25.5*  PLT 377 267  MCV 85.8 87.9  MCH 29.3 28.6  MCHC 34.1 32.5  RDW 14.1 14.2  LYMPHSABS 1.1  --   MONOABS 1.3*  --   EOSABS 0.0  --   BASOSABS 0.0  --     Chemistries   Recent Labs Lab 03/13/13 1700 03/14/13 0411 03/15/13 0802 03/16/13 0335  NA 132* 135* 135* 136*  K 4.6 4.0 4.3 4.5  CL 96 102 103 103  CO2 20 19 20 20   GLUCOSE 131* 268* 137* 136*  BUN 29* 27* 22 23  CREATININE 1.32 1.32 1.19 1.18  CALCIUM 8.6 8.1* 8.3* 8.3*    CBG:  Recent Labs Lab 03/14/13 1645 03/15/13 1622 03/15/13 2156 03/16/13 0612  GLUCAP 139* 147* 145* 112*    GFR Estimated Creatinine Clearance: 43.7 ml/min (by C-G formula based on Cr of 1.18).  Coagulation profile  Recent Labs Lab 03/13/13 1840 03/13/13 2215 03/14/13 0411 03/15/13 0345 03/16/13 0335  INR  1.67* 2.08* 2.24* 1.93* 1.78*    Cardiac Enzymes  Recent Labs Lab 03/13/13 1700  TROPONINI <0.30    No components found with this basename: POCBNP,  No results found for this basename: DDIMER,  in the last 72 hours  Recent Labs  03/15/13 1030  HGBA1C 6.4*   No results found for this basename: CHOL, HDL, LDLCALC, TRIG, CHOLHDL, LDLDIRECT,  in the last 72 hours No results found for this basename: TSH, T4TOTAL, FREET3, T3FREE, THYROIDAB,  in the last 72 hours No results found for this basename: VITAMINB12, FOLATE, FERRITIN,  TIBC, IRON, RETICCTPCT,  in the last 72 hours No results found for this basename: LIPASE, AMYLASE,  in the last 72 hours  Urine Studies No results found for this basename: UACOL, UAPR, USPG, UPH, UTP, UGL, UKET, UBIL, UHGB, UNIT, UROB, ULEU, UEPI, UWBC, URBC, UBAC, CAST, CRYS, UCOM, BILUA,  in the last 72 hours  MICROBIOLOGY: Recent Results (from the past 240 hour(s))  URINE CULTURE     Status: None   Collection Time    03/13/13  5:28 PM      Result Value Range Status   Specimen Description URINE, CLEAN CATCH   Final   Special Requests NONE   Final   Culture  Setup Time     Final   Value: 03/13/2013 19:05     Performed at [place]     Final   Value: >=100,000 COLONIES/ML     Performed at [place]     Final   Value: ENTEROBACTER CLOACAE     Performed at [facility]   Report Status PENDING   Incomplete  CULTURE, BLOOD (ROUTINE X 2)     Status: None   Collection Time    03/13/13  7:45 PM      Result Value Range Status   Specimen Description BLOOD HAND RIGHT   Final   Special Requests BOTTLES DRAWN AEROBIC AND ANAEROBIC 10CC   Final   Culture  Setup Time     Final   Value: 03/14/2013 01:09     Performed at [facility]   Culture     Final   Value:        BLOOD CULTURE RECEIVED NO GROWTH TO DATE CULTURE WILL BE HELD FOR 5 DAYS BEFORE ISSUING A FINAL NEGATIVE REPORT     Performed at  [facility]   Report Status PENDING   Incomplete  CULTURE, BLOOD (ROUTINE X 2)     Status: None   Collection Time    03/13/13  7:50 PM      Result Value Range Status   Specimen Description BLOOD HAND LEFT   Final   Special Requests BOTTLES DRAWN AEROBIC ONLY 10CC   Final   Culture  Setup Time     Final   Value: 03/14/2013 01:10     Performed at [facility]   Culture     Final   Value:        BLOOD CULTURE RECEIVED NO GROWTH TO DATE CULTURE WILL BE HELD FOR 5 DAYS BEFORE ISSUING A FINAL NEGATIVE REPORT     Performed at [facility]   Report Status PENDING   Incomplete    RADIOLOGY STUDIES/RESULTS: Dg Chest 2 View  03/13/2013   CLINICAL DATA:  Altered mental status, asthma  EXAM: CHEST  2 VIEW  COMPARISON:  03/06/2013  FINDINGS: Cardiomediastinal silhouette is stable. Osteopenia and degenerative changes thoracic spine again noted. Stable compression deformities thoracic spine.  Status post median sternotomy. There is small left pleural effusion with streaky left basilar atelectasis or infiltrate. No pulmonary edema. Atherosclerotic calcifications of thoracic aorta again noted.  IMPRESSION: Small left pleural effusion with streaky left basilar atelectasis or infiltrate. No pulmonary edema. Status post median sternotomy.   Electronically Signed   By: Lahoma Crocker M.D.   On: 03/13/2013 16:17   Dg Chest 2 View  03/06/2013   CLINICAL DATA:  Postop CABG.  EXAM: CHEST  2 VIEW  COMPARISON:  03/05/2013  FINDINGS: Left arm PICC tip at the cavoatrial junction. Aortic valve replacement. CABG.  Improved aeration in the lung apices with decreased infiltrate or edema bilaterally. Mild improvement in left lower lobe atelectasis and small left effusion.  IMPRESSION: Improved aeration in the lung apices bilaterally. Improvement in left lower lobe atelectasis.   Electronically Signed   By: Franchot Gallo M.D.   On: 03/06/2013 07:48   Dg Chest 2 View  02/25/2013   CLINICAL DATA:  Post CABG  and AVR.  EXAM: CHEST  2 VIEW  COMPARISON:  02/24/2013  FINDINGS: Two views of the chest demonstrate increased airspace densities in the left upper lung. Again noted are airspace densities in the right upper lung and left lung base. Evidence for small pleural effusions. No evidence for a pneumothorax. Heart size is within normal limits. Compression deformities in the thoracic spine. Post cardiac surgery changes.  IMPRESSION: Bilateral patchy airspace disease with progression in the left upper lung. Differential diagnosis includes pneumonia versus asymmetric edema.  Small bilateral pleural effusions.   Electronically Signed   By: Markus Daft M.D.   On: 02/25/2013 08:18   Dg Chest 2 View  02/24/2013   CLINICAL DATA:  Weakness.  CABG.  EXAM: CHEST  2 VIEW  COMPARISON:  02/23/2013.  08/26/2012.  FINDINGS: Interim removal of right jugular sheath. Noted on today's examination are patchy pulmonary infiltrates in both upper lobes and left lower lobe. Pneumonia could present in this fashion. Small left pleural effusion. Prior CABG. Cardiac valve replacement. There is cardiomegaly. Heart size and pulmonary vascularity are stable. Diffuse degenerative changes and osteopenia thoracic spine. Stable thoracic spine compression fractures.  IMPRESSION: New onset bilateral upper lobe and left lower lobe pulmonary infiltrates suggesting pneumonia. Associated small left pleural effusion.   Electronically Signed   By: [place]   On: 02/24/2013 08:06   Ct Head Wo Contrast  03/13/2013   CLINICAL DATA:  Altered mental status. Recent cardiac bypass surgery. The patient was discharged to the nursing home yesterday.  EXAM: CT HEAD WITHOUT CONTRAST  TECHNIQUE: Contiguous axial images were obtained from the base of the skull through the vertex without intravenous contrast.  COMPARISON:  CT head without contrast 03/02/2013.  FINDINGS: Remote lacunar infarcts are again noted within the basal ganglia and scratch the remote lacunar  infarcts are again noted within the cerebellum. No acute cortical infarct, hemorrhage, or mass lesion is present. The ventricles are proportionate to the degree of atrophy and stable. No significant extra-axial fluid collection is present.  The paranasal sinuses and mastoid air cells are clear. The osseous skull is intact.  IMPRESSION: 1. No acute intracranial abnormality or significant interval change. 2. Stable remote lacunar infarcts of the cerebellum.   Electronically Signed   By: Lawrence Santiago M.D.   On: 03/13/2013 16:03   Ct Head Wo Contrast  03/02/2013   CLINICAL DATA:  Slurred speech and facial droop.  Rule out CVA  EXAM: CT HEAD WITHOUT CONTRAST  TECHNIQUE: Contiguous axial images were obtained from the base of the skull through the vertex without intravenous contrast.  COMPARISON:  MRI 12/12/2012  FINDINGS: Mild atrophy. Negative for acute infarct, hemorrhage, or mass lesion. Hypodensity right occipital white matter consistent with chronic ischemia. No mass effect or midline shift. Small chronic infarcts cerebellum bilaterally.  Calvarium is intact.  IMPRESSION: No acute abnormality.   Electronically Signed   By: Franchot Gallo M.D.   On: 03/02/2013 09:24   Ct Chest Wo Contrast  02/26/2013  CLINICAL DATA:  Low grade fever and shortness of breath.  EXAM: CT CHEST WITHOUT CONTRAST  TECHNIQUE: Multidetector CT imaging of the chest was performed following the standard protocol without IV contrast.  COMPARISON:  Multiple prior chest x-rays  FINDINGS: The chest wall is unremarkable. No supraclavicular or axillary mass or adenopathy. The thyroid gland is grossly normal. The bony thorax is intact. Surgical changes from bypass surgery are noted.  The heart is normal in size. No pericardial effusion. A prosthetic aortic valve is noted along with surgical changes from bypass surgery and dense coronary artery calcifications. The esophagus is grossly normal. Dense aortic calcifications but no focal aneurysm.   There are moderate to large bilateral pleural effusions. There is dense upper lobe airspace consolidation with air bronchograms and more patchy ill-defined airspace opacities in the lower lobes bilaterally, left greater than right. No definite findings for pulmonary edema.  The trachea appears normal. The mainstem bronchi are somewhat compressed and slit-like. No bronchiectasis.  The upper abdomen is unremarkable. Dense aortic calcifications are noted. Eighty upper pole renal cyst is noted.  IMPRESSION: Moderate bilateral pleural effusions and bilateral infiltrates/pneumonia.  Stable surgical changes from bypass surgery and aortic valve replacement surgery.  The mainstem bronchi are somewhat narrowed.   Electronically Signed   By: Kalman Jewels M.D.   On: 02/26/2013 11:22   Dg Chest Port 1 View  03/05/2013   CLINICAL DATA:  Status post CABG.  EXAM: PORTABLE CHEST - 1 VIEW  COMPARISON:  03/04/2013  FINDINGS: There is significantly improved aeration of both lungs with some residual atelectasis remaining in the left lower lobe. No overt edema or evidence of pneumothorax. No significant pleural effusions. The heart size and mediastinal contours are stable.  IMPRESSION: Significantly improved aeration of both lungs.   Electronically Signed   By: Aletta Edouard M.D.   On: 03/05/2013 08:24   Dg Chest Port 1 View  03/04/2013   CLINICAL DATA:  Pulmonary infiltrates.  EXAM: PORTABLE CHEST - 1 VIEW  COMPARISON:  03/03/2013 .  FINDINGS: Left PICC line in stable position. Persistent bilateral upper lobe and left lower lobe infiltrates are present. These appear increased in prominence from prior exam. Prior CABG. Cardiomegaly with pulmonary venous congestion and diffuse interstitial prominence with left pleural effusion noted. These findings are consistent with congestive heart failure. These findings are new. No pneumothorax or acute osseous abnormality.  IMPRESSION: 1. Stable PICC line position. 2. Persistent bilateral  upper lobe and left lower lobe pulmonary infiltrates which increased slightly from prior exam. 3. Prior CABG. New onset congestive heart failure with pulmonary interstitial edema and left-sided pleural effusion.   Electronically Signed   By: Marcello Moores  Register   On: 03/04/2013 07:31   Dg Chest Port 1 View  03/03/2013   CLINICAL DATA:  CABG.  EXAM: PORTABLE CHEST - 1 VIEW  COMPARISON:  03/02/2013.  FINDINGS: Central line noted in stable position. PICC line noted in stable position. Stable cardiomegaly. Prior CABG . Persistent bilateral upper lobe and left lower lobe infiltrates are present. Left-sided pleural effusion is present. No pneumothorax.  IMPRESSION: Persistent bilateral upper lobe and left lower lobe infiltrates.   Electronically Signed   By: Marcello Moores  Register   On: 03/03/2013 08:39   Dg Chest Port 1 View  03/02/2013   CLINICAL DATA:  Status post CABG.  EXAM: PORTABLE CHEST - 1 VIEW  COMPARISON:  03/01/2013  FINDINGS: There is improved aeration of the left lung with upper and lower lung infiltrates remaining.  The right upper lobe infiltrate is slightly more dense. No large pleural effusions are identified. The heart size is stable. There is stable positioning of a left-sided PICC line.  IMPRESSION: Improved aeration of the left lung with residual upper and lower lung infiltrates. Slightly more dense right upper lobe infiltrate.   Electronically Signed   By: Aletta Edouard M.D.   On: 03/02/2013 07:52   Dg Chest Port 1 View  03/01/2013   CLINICAL DATA:  ARDS  EXAM: PORTABLE CHEST - 1 VIEW  COMPARISON:  02/28/2013  FINDINGS: Previous coronary bypass changes noted. Mild cardiomegaly with vascular congestion. Persistent bilateral upper lobe and left lower lobe airspace opacities. Minimal right base atelectasis. Small effusions remain. No significant interval change. No pneumothorax.  IMPRESSION: Stable asymmetric bilateral airspace process compatible with ARDS or pneumonia. No pneumothorax.    Electronically Signed   By: Daryll Brod M.D.   On: 03/01/2013 09:05   Dg Chest Port 1 View  02/28/2013   CLINICAL DATA:  ARDS  EXAM: PORTABLE CHEST - 1 VIEW  COMPARISON:  02/27/2013  FINDINGS: Prior coronary bypass changes noted. Left PICC line tip at the SVC RA junction. Atherosclerosis present of the aorta. Asymmetric Stable airspace process throughout both lungs, most pronounced in the upper lobes and in the left lower lobe. No enlarging effusion or pneumothorax. No significant interval change.  IMPRESSION: Stable bilateral airspace process in the upper lobes and left lower lobe compatible with ARDS versus pneumonia.   Electronically Signed   By: Daryll Brod M.D.   On: 02/28/2013 08:22   Dg Chest Port 1 View  02/27/2013   CLINICAL DATA:  Shortness of breath, ARDS.  EXAM: PORTABLE CHEST - 1 VIEW  COMPARISON:  February 26, 2013  FINDINGS: The heart size and mediastinal contours are stable. Heart size is enlarged. Airspace consolidation of bilateral upper lobes and the left lung base not changed. The visualized skeletal structures are stable.  IMPRESSION: Persistent airspace infiltrates in bilateral upper lobes and in the left lung base unchanged compared to prior exam, favor pneumonia   Electronically Signed   By: Abelardo Diesel M.D.   On: 02/27/2013 07:58   Dg Chest Port 1 View  02/26/2013   CLINICAL DATA:  Pneumonitis, COPD, asthma  EXAM: PORTABLE CHEST - 1 VIEW  COMPARISON:  Portable exam [id number] hr compared to 02/25/2013  FINDINGS: Enlargement of cardiac silhouette post CABG and AVR.  Atherosclerotic calcification aorta.  Patchy airspace pulmonary infiltrates diffusely in both lungs, greatest in upper lobes, question pneumonia or less likely edema.  No pleural effusion or pneumothorax.  Bones demineralized.  IMPRESSION: Enlargement of cardiac silhouette post CABG and AVR.  Persistent bilateral airspace infiltrates greatest in the upper lobes, favor pneumonia over edema.   Electronically Signed   By: Lavonia Dana M.D.   On: 02/26/2013 16:49   Dg Chest Port 1 View  02/23/2013   CLINICAL DATA:  Followup coronary bypass grafting  EXAM: PORTABLE CHEST - 1 VIEW  COMPARISON:  02/22/2013  FINDINGS: Cardiac shadow is stable. Postoperative changes are again seen. A right jugular sheath is again noted and stable. Continued improved aeration is noted. No pneumothorax is seen. No significant pleural effusion is noted.  IMPRESSION: Postoperative changes.  No acute abnormality is noted.   Electronically Signed   By: Inez Catalina M.D.   On: 02/23/2013 07:43   Dg Chest Port 1 View  02/22/2013   CLINICAL DATA:  Status post cardiac surgery.  EXAM: PORTABLE CHEST -  1 VIEW  COMPARISON:  Chest x-ray 02/21/2013.  FINDINGS: Previously noted left-sided chest tube has been removed. Likewise, the previously noted Swan-Ganz catheter has been removed, while the right IJ central venous Cordis remains in position with tip terminating in the proximal superior vena cava. Previously noted mediastinal/ pericardial drain has also been removed. Lung volumes remain low, and there are persistent bibasilar opacities, presumably postoperative subsegmental atelectasis. Small left pleural effusion. No appreciable left pneumothorax following removal of chest tube. No evidence of pulmonary edema. Mild enlargement of the cardiopericardial silhouette, similar to the prior study and within normal limits in this postoperative patient. Atherosclerosis in the thoracic aorta. Status post median sternotomy for CABG.  IMPRESSION: 1. Postoperative changes and support apparatus, as above. 2. Small left pleural effusion. No significant pneumothorax following removal of left-sided chest tube. 3. Persistent bibasilar opacities in the setting of low lung volumes in this postoperative patient presumably represent postoperative subsegmental atelectasis.   Electronically Signed   By: Vinnie Langton M.D.   On: 02/22/2013 09:28   Dg Chest Portable 1 View In Am  02/21/2013    CLINICAL DATA:  Status post CABG and aortic valve replacement  EXAM: PORTABLE CHEST - 1 VIEW  COMPARISON:  Prior chest x-ray 02/20/2013  FINDINGS: The patient is heavily rotated to the right. The tip of the Swan-Ganz catheter appears to have advanced and is now in the mid to distal right lower lobe pulmonary artery. The patient has been extubated and the nasogastric tube removed. Subxiphoid mediastinal drain and left thoracostomy tube remain in unchanged position. Patient is status post median sternotomy with evidence of aortic valve replacement and multivessel CABG. Atherosclerotic calcifications noted in the transverse aorta. Slightly lower inspiratory volumes with increased left basilar atelectasis. No overt pulmonary edema. No pneumothorax.  IMPRESSION: 1. The the tip of the Swan-Ganz catheter has advanced and is now in the mid to distal right lower lobe pulmonary artery. Recommend withdrawing this at least 3 cm to avoid right lower lobe pulmonary artery injury during balloon inflation. 2. Interval extubation with resultant slightly lower inspiratory volumes and increased left basilar atelectasis.   Electronically Signed   By: Jacqulynn Cadet M.D.   On: 02/21/2013 08:32   Dg Chest Portable 1 View  02/20/2013   CLINICAL DATA:  Postop  EXAM: PORTABLE CHEST - 1 VIEW  COMPARISON:  02/10/2013  FINDINGS: Mild cardiac enlargement similar to prior study. Endotracheal tube 3.9 cm above carina. Right Swan-Ganz catheter tip in right main pulmonary artery. NG tube into the stomach. Left chest tube. Mediastinal drain. Prosthetic cardiac valve noted. Mitral annular calcification noted. Right lung is clear. On the left side, there is perihilar opacity. There is a small left pleural effusion. No significant pneumothorax.  IMPRESSION: Lines and tubes as described above.  Postoperative change, with small left effusion, left lower lobe consolidation likely representing atelectasis, and left perihilar opacity possibly  representing asymmetric pulmonary edema, mild.   Electronically Signed   By: Skipper Cliche M.D.   On: 02/20/2013 14:40   Dg Swallowing Func-speech Pathology  02/26/2013   Orbie Pyo [place], CCC-SLP     02/26/2013  3:03 PM Objective Swallowing Evaluation: Modified Barium Swallowing Study   Patient Details  Name: DYLAN MONFORTE MRN: [id number] Date of Birth: 03-12-1930  Today's Date: 02/26/2013 Time: 8527-7824 SLP Time Calculation (min): 27 min  Past Medical History:  Past Medical History  Diagnosis Date  . Anemia   . Colon polyp   . COPD (chronic obstructive pulmonary  disease)     Astmatic bronchitis  . Asthma with bronchitis   . Gout     PMH  . Osteoporosis   . Allergic rhinitis   . Skin cancer, basal cell     Dr Nevada Crane, PMH  . Nephrolithiasis   . HLD (hyperlipidemia)   . Skin cancer (melanoma) 2012    Right neck  . Arthritis     SHOULDER  . Headache(784.0)   . RLS (restless legs syndrome)   . Dysrhythmia 08/2012    NEW ONSET ATRIAL FIBRILATION   Past Surgical History:  Past Surgical History  Procedure Laterality Date  . Finger surgery      for foreign body  . Colonoscopy w/ polypectomy      Dr Deatra Ina  . Inguinal hernia repair    . Nose surgery      Septal Deviation  . Neck surgery  12/2010    Melanoma ; [facility]   . Leg surgery  06/2010     Dr.Lawson, for blood clott. Left leg   . Tee without cardioversion N/A 01/23/2013    Procedure: TRANSESOPHAGEAL ECHOCARDIOGRAM (TEE);  Surgeon:  Jolaine Artist, MD;  Location: [facility] ENDOSCOPY;  Service:  Cardiovascular;  Laterality: N/A;  sign heart cath consent w/tee  consent  . Coronary artery bypass graft N/A 02/20/2013    Procedure: CORONARY ARTERY BYPASS GRAFTING (CABG);  Surgeon:  Ivin Poot, MD;  Location: [place];  Service: Open Heart  Surgery;  Laterality: N/A;  . Aortic valve replacement N/A 02/20/2013    Procedure: AORTIC VALVE REPLACEMENT (AVR);  Surgeon: Ivin Poot, MD;  Location: [place];  Service: Open Heart Surgery;   Laterality: N/A;  . Intraoperative  transesophageal echocardiogram N/A 02/20/2013    Procedure: INTRAOPERATIVE TRANSESOPHAGEAL ECHOCARDIOGRAM;   Surgeon: Ivin Poot, MD;  Location: [place];  Service: Open  Heart Surgery;  Laterality: N/A;   HPI:  80 yr old underwent CABG x3 and aortic valve replacement.    Associated small left pleural effusion.  Now with Altered mental  status confusion.  CXR New onset bilateral upper lobe and left  lower lobe pulmonary infiltrates suggesting pneumonia.  Lung  status worsening and suspicion of aspiration, therefore SLP  recommended MBS to fully assess.     Assessment / Plan / Recommendation Clinical Impression  Dysphagia Diagnosis: Mild oral phase dysphagia;Mild pharyngeal  phase dysphagia;Moderate pharyngeal phase dysphagia Clinical impression: Pt. exhibited mild oral dysphagia with  delayed transit exacerbated by xerostomia and decreased  endurance.  Mild-mod sensory pharyngeal dysphagia with reduced  tongue base retraction and laryngeal elevation resulting in trace  vallecular/pyriform sinus/pharyngeal wall residue.  Observed one  episode of flash laryngeal penetration with thin with increased  work of breathing throughout study.  SLP educated pt. on  importance with clinical reasoning of swallow precautions (do not  eat if increased SOB, frequent rest breaks, small sips, cup sips  preferred).  Recommend continue regular texture, thin liquids  following swallow precautions with continued ST for reiteration  and safety with po's.      Treatment Recommendation  Therapy as outlined in treatment plan below    Diet Recommendation Regular;Thin liquid   Liquid Administration via: Cup;No straw Medication Administration: Whole meds with puree Supervision: Patient able to self feed;Intermittent supervision  to cue for compensatory strategies Compensations: Slow rate;Small sips/bites Postural Changes and/or Swallow Maneuvers: Seated upright 90  degrees;Upright 30-60 min after meal    Other  Recommendations Oral Care  Recommendations: Oral  care BID   Follow Up Recommendations  None    Frequency and Duration min 2x/week  2 weeks   Pertinent Vitals/Pain WDL         Reason for Referral Objectively evaluate swallowing function   Oral Phase Oral Preparation/Oral Phase Oral Phase: Impaired Oral - Honey Oral - Honey Teaspoon: Delayed oral transit;Weak lingual  manipulation Oral - Nectar Oral - Nectar Cup: Delayed oral transit;Weak lingual manipulation Oral - Solids Oral - Puree: Delayed oral transit;Weak lingual manipulation   Pharyngeal Phase Pharyngeal Phase Pharyngeal Phase: Impaired Pharyngeal - Honey Pharyngeal - Honey Teaspoon: Pharyngeal residue -  valleculae;Reduced tongue base retraction Pharyngeal - Nectar Pharyngeal - Nectar Teaspoon: Delayed swallow  initiation;Premature spillage to valleculae Pharyngeal - Nectar Cup: Delayed swallow initiation;Premature  spillage to pyriform sinuses;Pharyngeal residue -  valleculae;Reduced tongue base retraction;Premature spillage to  valleculae Pharyngeal - Thin Pharyngeal - Thin Teaspoon: Delayed swallow initiation;Premature  spillage to valleculae Pharyngeal - Thin Cup: Premature spillage to valleculae;Delayed  swallow initiation;Premature spillage to pyriform  sinuses;Pharyngeal residue - valleculae;Reduced tongue base  retraction;Pharyngeal residue - pyriform  sinuses;Penetration/Aspiration during swallow (trace residue) Penetration/Aspiration details (thin cup): Material enters  airway, remains ABOVE vocal cords and not ejected out Pharyngeal - Thin Straw: Delayed swallow initiation;Premature  spillage to pyriform sinuses;Pharyngeal residue -  valleculae;Reduced tongue base retraction Pharyngeal - Solids Pharyngeal - Puree: Pharyngeal residue - valleculae;Reduced  tongue base retraction;Pharyngeal residue - posterior pharnyx  Cervical Esophageal Phase    GO    Cervical Esophageal Phase Cervical Esophageal Phase: Darryll Capers         Cranford Mon.Ed CCC-SLP Pager [phone]  02/26/2013     Oren Binet, MD  [facility] Pager:[phone]  If 7PM-7AM, please contact night-coverage [email or web address] Password [email or web address] 03/16/2013, 11:02 AM   LOS: 3 days

## 2013-03-16 NOTE — Progress Notes (Signed)
[place] for :  Coumadin ;  Vancomycin, Zosyn Indication:  atrial fibrillation  ;  Sepsis, presumed HCAP, enterobacter UTI  Dosing weight 62 kg  Lab Results  Component Value Date   CULT  Value:        BLOOD CULTURE RECEIVED NO GROWTH TO DATE CULTURE WILL BE HELD FOR 5 DAYS BEFORE ISSUING A FINAL NEGATIVE REPORT Performed at [facility] 03/13/2013   CULT  Value:        BLOOD CULTURE RECEIVED NO GROWTH TO DATE CULTURE WILL BE HELD FOR 5 DAYS BEFORE ISSUING A FINAL NEGATIVE REPORT Performed at [facility] 03/13/2013   CULT  Value: ENTEROBACTER CLOACAE Performed at [place] 03/13/2013     Recent Labs  03/13/13 1700 03/14/13 0411 03/15/13 0345 03/15/13 0802 03/16/13 0335  HGB 10.3*  --   --   --  8.3*  HCT 30.2*  --   --   --  25.5*  PLT 377  --   --   --  267  INR  --  2.24* 1.93*  --  1.78*  CREATININE 1.32 1.32  --  1.19 1.18   Lab Results  Component Value Date   INR 1.78* 03/16/2013   INR 1.93* 03/15/2013   INR 2.24* 03/14/2013   Estimated Creatinine Clearance: 43.7 ml/min (by C-G formula based on Cr of 1.18).  Current Medication[s] Include: Scheduled:  Scheduled:  . aspirin EC  81 mg Oral Daily  . budesonide-formoterol  2 puff Inhalation BID  . busPIRone  7.5 mg Oral BID  . calcium-vitamin D  1 tablet Oral Daily  . cholecalciferol  1,000 Units Oral Daily  . diltiazem  240 mg Oral Daily  . famotidine  20 mg Oral Daily  . feeding supplement (ENSURE COMPLETE)  237 mL Oral BID BM  . ferrous sulfate  325 mg Oral Q breakfast  . fluconazole  50 mg Oral Daily  . fluticasone  1 spray Each Nare Daily  . insulin aspart  0-5 Units Subcutaneous QHS  . insulin aspart  0-9 Units Subcutaneous TID WC  . ketotifen  1 drop Both Eyes Daily  . loratadine  10 mg Oral Daily  . metoprolol tartrate  25 mg Oral BID  . multivitamin with minerals  1 tablet Oral Daily  . nystatin  5 mL Oral QID  . piperacillin-tazobactam (ZOSYN)  IV   3.375 g Intravenous Q8H  . polyethylene glycol  17 g Oral Daily  . pravastatin  40 mg Oral q1800  . sodium chloride  3 mL Intravenous Q12H  . topiramate  75 mg Oral Daily  . vancomycin  500 mg Intravenous Q12H  . Warfarin - Pharmacist Dosing Inpatient   Does not apply q1800   Infusion[s]: Infusions:  None Antibiotic[s]: Anti-infectives   Start     Dose/Rate Route Frequency Ordered Stop   03/14/13 1000  fluconazole (DIFLUCAN) tablet 50 mg     50 mg Oral Daily 03/13/13 2027     03/14/13 0800  vancomycin (VANCOCIN) 500 mg in sodium chloride 0.9 % 100 mL IVPB     500 mg 100 mL/hr over 60 Minutes Intravenous Every 12 hours 03/13/13 2028     03/14/13 0400  piperacillin-tazobactam (ZOSYN) IVPB 3.375 g     3.375 g 12.5 mL/hr over 240 Minutes Intravenous Every 8 hours 03/13/13 2028     03/13/13 1830  vancomycin (VANCOCIN) IVPB 1000 mg/200 mL premix     1,000  mg 200 mL/hr over 60 Minutes Intravenous  Once 03/13/13 1824 03/14/13 0335   03/13/13 1830  piperacillin-tazobactam (ZOSYN) IVPB 3.375 g     3.375 g 100 mL/hr over 30 Minutes Intravenous  Once 03/13/13 1824 03/13/13 2031      Assessment:  Day # 3 of Vancomycin, Day # 4 Zosyn for Sepsis, presumed HCAP, enterococcal UTI [ ss pending ].  Patient also on oral fluconazole for thrush.  Continuing on Coumadin for history of atrial fibrillation and tissue AVR.  Renal function stable.  INR trending down with Hgb down as well.  No overt bleeding complications noted   Goal of Therapy:   INR 2-3  Vancomycin trough 15 - 20 mcg/ml.     Plan:  1. Coumadin 2 mg po today.  Will watch INR closely while patient is on fluconazole. 2. Continue IV antibiotics as ordered.  Plan to follow transition to oral antibiotics pending sensitivity results.  Briggette Najarian, Craig Guess, Pharm.D. 03/16/2013, 11:32 AM

## 2013-03-17 DIAGNOSIS — G9340 Encephalopathy, unspecified: Secondary | ICD-10-CM

## 2013-03-17 LAB — GLUCOSE, CAPILLARY
Glucose-Capillary: 117 mg/dL — ABNORMAL HIGH (ref 70–99)
Glucose-Capillary: 110 mg/dL — ABNORMAL HIGH (ref 70–99)

## 2013-03-17 LAB — URINE CULTURE: Colony Count: >=100000

## 2013-03-17 LAB — PROTIME-INR
INR: 1.68 — ABNORMAL HIGH (ref 0.00–1.49)
Prothrombin Time: 19.3 seconds — ABNORMAL HIGH (ref 11.6–15.2)

## 2013-03-17 MED ORDER — FLUCONAZOLE 50 MG PO TABS: 50.0000 mg | ORAL_TABLET | Freq: Every day | ORAL | Status: DC

## 2013-03-17 MED ORDER — ENSURE COMPLETE PO LIQD: 237.0000 mL | Freq: Two times a day (BID) | ORAL | Status: DC

## 2013-03-17 MED ORDER — WARFARIN SODIUM 4 MG PO TABS
4.0000 mg | ORAL_TABLET | Freq: Once | ORAL | Status: DC
  Filled 2013-03-17: qty 1

## 2013-03-17 MED ORDER — LEVOFLOXACIN 750 MG PO TABS
750.0000 mg | ORAL_TABLET | ORAL | Status: DC
  Filled 2013-03-17: qty 1

## 2013-03-17 MED ORDER — NYSTATIN 100000 UNIT/ML MT SUSP: 5.0000 mL | Freq: Four times a day (QID) | OROMUCOSAL | Status: DC

## 2013-03-17 MED ORDER — FERROUS SULFATE 325 (65 FE) MG PO TABS: 325.0000 mg | ORAL_TABLET | Freq: Every day | ORAL | Status: DC

## 2013-03-17 MED ORDER — LEVOFLOXACIN 750 MG PO TABS: 750.0000 mg | ORAL_TABLET | ORAL | Status: DC

## 2013-03-17 MED ORDER — POLYETHYLENE GLYCOL 3350 17 G PO PACK: 17.0000 g | PACK | Freq: Every day | ORAL | Status: DC

## 2013-03-17 MED ORDER — WARFARIN SODIUM 1 MG PO TABS: ORAL_TABLET | ORAL | Status: DC

## 2013-03-17 NOTE — Progress Notes (Signed)
CSW received consult for SNF placement. Per CM, patient is going with home with home health. CSW signing off.   Please re consult if social work needs arise.  Jeanette Caprice, MSW, [place]

## 2013-03-17 NOTE — Care Management Note (Signed)
    Page 1 of 1   03/17/2013     4:39:03 PM   CARE MANAGEMENT NOTE 03/17/2013  Patient:  Robert Richard, Robert Richard   Account Number:  [id number]  Date Initiated:  03/17/2013  Documentation initiated by:  Courteney Alderete  Subjective/Objective Assessment:   PT ADM ON 1/23 WITH ACUTE METABOLIC ENCEPHALOPATHY, HCAP. PTA, PT RESIDED AT [place].  HE IS ACTIVE WITH [facility] FOR HOME CARE.     Action/Plan:   PT FOR DC HOME TODAY WITH SPOUSE.  WILL RESUME HOME CARE; HHRN TO DO PT/INR DRAWS.  NOTIFIED [place].  START OF CARE 24-48H POST DC DATE.   Anticipated DC Date:  03/17/2013   Anticipated DC Plan:  [place]  CM consult      [facility] Choice  Resumption Of Svcs/PTA Provider   Choice offered to / List presented to:  C-1 Patient        [place] arranged  HH-1 RN  [place].   Status of service:  Completed, signed off Medicare Important Message given?   (If response is "NO", the following Medicare IM given date fields will be blank) Date Medicare IM given:   Date Additional Medicare IM given:    Discharge Disposition:  [place]  Per UR Regulation:  Reviewed for med. necessity/level of care/duration of stay  If discussed at [place] of Stay Meetings, dates discussed:    Comments:

## 2013-03-17 NOTE — Progress Notes (Signed)
Speech Language Pathology Treatment: Dysphagia  Patient Details Name: Robert Richard MRN: [id number] DOB: March 01, 1930 Today's Date: 03/17/2013 Time: 0315-9458 SLP Time Calculation (min): 24 min  Assessment / Plan / Recommendation Clinical Impression  Pt tolerating Dys 2 and cup sips of thin liquid diet, without overt s/s of aspiration. Due to oral thrush, pt wishes to remain on Dys 2 diet. Pt. Tolerated cup sips of water and his meds in puree without difficulty. Trials of successive straw sips resulted in immediate coughing, therefore I recommend pt continue on single cup sips of thin liquids. Pt and spouse are in agreement and verbalize all swallowing strategies and aspiration precautions. Plan to d/c home with [facility] today.   HPI HPI:  Acute metabolic encephalopathy secondary to UTI. Appears to be much improved and close to baseline, continue empiric antibiotics for UTI and presumed HCAP.  MBS completed 02/26/13: Mild oral phase with mild to moderate pharyngeal phase dysphagia with diet recommendations of regular consistency and thin liquids.    BSE ordered to assess risk for aspiration and recommend safest, PO diet.     Pertinent Vitals No temp spikes   SLP Plan       Recommendations Diet recommendations: Dysphagia 2 (fine chop) Liquids provided via: Cup Medication Administration: Whole meds with puree Supervision: Patient able to self feed Compensations: Slow rate;Small sips/bites Postural Changes and/or Swallow Maneuvers: Seated upright 90 degrees              Follow up Recommendations: Home health SLP    GO     Iline Oven 03/17/2013, 11:47 AM

## 2013-03-17 NOTE — Discharge Summary (Signed)
PATIENT DETAILS Name: Robert Richard Age: 80 y.o. Sex: male Date of Birth: [birth year] MRN: [id number]. Admit Date: 03/13/2013 Admitting Physician: Phillips Climes, MD [id number] Linna Darner, MD  Recommendations for Outpatient Follow-up:  1. Home health RN to draw PT/INR on 1/30 fax results to [facility], further dosing of Coumadin done at the discretion of the Coumadin clinic 2. Please recheck CBC and chemistries at next visit  PRIMARY DISCHARGE DIAGNOSIS:  Principal Problem:   Sepsis Active Problems:   HYPERLIPIDEMIA   ANEMIA-NOS   HYPERTENSION   ASTHMA   COPD   OSTEOPOROSIS   S/P CABG x 3   Protein-calorie malnutrition, severe   HCAP (healthcare-associated pneumonia)   UTI (lower urinary tract infection)   CAD (coronary artery disease)   Atrial fibrillation, controlled   Oral thrush      PAST MEDICAL HISTORY: Past Medical History  Diagnosis Date  . Anemia   . Colon polyp   . COPD (chronic obstructive pulmonary disease)     Astmatic bronchitis  . Asthma with bronchitis   . Gout     PMH  . Osteoporosis   . Allergic rhinitis   . Skin cancer, basal cell     Dr [place] Crane, PMH  . Nephrolithiasis   . HLD (hyperlipidemia)   . Skin cancer (melanoma) 2012    Right neck  . Arthritis     SHOULDER  . Headache(784.0)   . RLS (restless legs syndrome)   . Dysrhythmia 08/2012    NEW ONSET ATRIAL FIBRILATION    DISCHARGE MEDICATIONS:   Medication List    STOP taking these medications       HYDROcodone-acetaminophen 5-325 MG per tablet  Commonly known as:  NORCO/VICODIN      TAKE these medications       aspirin EC 81 MG tablet  Take 81 mg by mouth daily.     budesonide-formoterol 160-4.5 MCG/ACT inhaler  Commonly known as:  SYMBICORT  Inhale 2 puffs into the lungs 2 (two) times daily.     busPIRone 15 MG tablet  Commonly known as:  BUSPAR  Take 7.5 mg by mouth 2 (two) times daily.     cetirizine 10 MG tablet  Commonly known as:  ZYRTEC  Take 10  mg by mouth daily as needed for allergies.     diltiazem 240 MG 24 hr capsule  Commonly known as:  CARDIZEM CD  Take 1 capsule (240 mg total) by mouth daily.     diphenhydrAMINE 25 mg capsule  Commonly known as:  BENADRYL  Take 25 mg by mouth every 4 (four) hours as needed for allergies.     feeding supplement (ENSURE COMPLETE) Liqd  Take 237 mLs by mouth 2 (two) times daily between meals.     ferrous sulfate 325 (65 FE) MG tablet  Take 1 tablet (325 mg total) by mouth daily with breakfast.     fluconazole 50 MG tablet  Commonly known as:  DIFLUCAN  Take 1 tablet (50 mg total) by mouth daily.     furosemide 40 MG tablet  Commonly known as:  LASIX  Take 1 tablet (40 mg total) by mouth daily. For 5 Days     hydrALAZINE 10 MG tablet  Commonly known as:  APRESOLINE  Take 1 tablet (10 mg total) by mouth 3 (three) times daily.     ketotifen 0.025 % ophthalmic solution  Commonly known as:  ZADITOR  Place 1 drop into both eyes daily.     levofloxacin  750 MG tablet  Commonly known as:  LEVAQUIN  Take 1 tablet (750 mg total) by mouth every other day.     Lidocaine HCl 4 % Soln  Apply 4 mLs topically as needed (for migraines).     metoprolol tartrate 25 MG tablet  Commonly known as:  LOPRESSOR  Take 1 tablet (25 mg total) by mouth 2 (two) times daily.     polyethylene glycol packet  Commonly known as:  MIRALAX / GLYCOLAX  Take 17 g by mouth daily.     MIRALAX powder  Generic drug:  polyethylene glycol powder  Take 17 g by mouth daily.     nitroGLYCERIN 0.4 MG SL tablet  Commonly known as:  NITROSTAT  Place 0.4 mg under the tongue every 5 (five) minutes as needed for chest pain.     nystatin 100000 UNIT/ML suspension  Commonly known as:  MYCOSTATIN  Take 5 mLs (500,000 Units total) by mouth 4 (four) times daily.     potassium chloride SA 20 MEQ tablet  Commonly known as:  K-DUR,KLOR-CON  Take 1 tablet (20 mEq total) by mouth daily. For 5 Days     pravastatin 40 MG  tablet  Commonly known as:  PRAVACHOL  Take 40 mg by mouth daily.     ranitidine 150 MG tablet  Commonly known as:  ZANTAC  Take 150 mg by mouth 2 (two) times daily.     SF 5000 PLUS 1.1 % Crea dental cream  Generic drug:  sodium fluoride  Place 1 application onto teeth at bedtime. As directed     topiramate 25 MG tablet  Commonly known as:  TOPAMAX  Take 75 mg by mouth daily.     traMADol 50 MG tablet  Commonly known as:  ULTRAM  Take 1-2 tablets (50-100 mg total) by mouth every 6 (six) hours as needed for moderate pain.     warfarin 1 MG tablet  Commonly known as:  COUMADIN  Take 4 mg once daily on 03/17/13, and then from 03/18/13 take 1 mg daily. Home RN with draw PT/INR on 03/20/13 and fax the results to the Coumadin clinic, further dosing and INR checks will be deferred to the Coumadin clinic.        ALLERGIES:   Allergies  Allergen Reactions  . Ivp Dye [Iodinated Diagnostic Agents] Hives    BRIEF HPI:  See H&P, Labs, Consult and Test reports for all details in brief, Robert Richard is a 80 y.o. male, recently discharged from [facility], after lengthy hospital course the status post coronary artery bypass grafting and aortic valve replacement, patient brought was brought by his family to do to increase lethargy, weakness, and fever at home, the patient had basic workup in ED did show significant leukocytosis at 21,000, low-grade fever at 99.2, tachypnea, patients had urinalysis positive for UTI.  CONSULTATIONS:   None  PERTINENT RADIOLOGIC STUDIES: Dg Chest 2 View  03/16/2013   CLINICAL DATA:  Community acquired pneumonia, history COPD, asthma, CABG, basal cell skin cancer  EXAM: CHEST  2 VIEW  COMPARISON:  03/13/2013  FINDINGS: Mild enlargement of cardiac silhouette post CABG and AVR.  Dense mitral annular calcification.  Atherosclerotic calcification aorta.  Pulmonary vascularity normal.  Persistent left basilar opacity favor infiltrate.  Underlying COPD changes.   Remaining lungs clear.  Small left pleural effusion.  No pneumothorax.  Osseous demineralization with superior endplate compression deformities of multiple thoracic vertebrae again noted.  IMPRESSION: Mild enlargement of cardiac silhouette post  CABG and AVR.  COPD changes with persistent left lower lobe opacity likely infiltrate and small left pleural effusion.   Electronically Signed   By: Lavonia Dana M.D.   On: 03/16/2013 11:19   Dg Chest 2 View  03/13/2013   CLINICAL DATA:  Altered mental status, asthma  EXAM: CHEST  2 VIEW  COMPARISON:  03/06/2013  FINDINGS: Cardiomediastinal silhouette is stable. Osteopenia and degenerative changes thoracic spine again noted. Stable compression deformities thoracic spine.  Status post median sternotomy. There is small left pleural effusion with streaky left basilar atelectasis or infiltrate. No pulmonary edema. Atherosclerotic calcifications of thoracic aorta again noted.  IMPRESSION: Small left pleural effusion with streaky left basilar atelectasis or infiltrate. No pulmonary edema. Status post median sternotomy.   Electronically Signed   By: Lahoma Crocker M.D.   On: 03/13/2013 16:17   Dg Chest 2 View  03/06/2013   CLINICAL DATA:  Postop CABG.  EXAM: CHEST  2 VIEW  COMPARISON:  03/05/2013  FINDINGS: Left arm PICC tip at the cavoatrial junction. Aortic valve replacement. CABG.  Improved aeration in the lung apices with decreased infiltrate or edema bilaterally. Mild improvement in left lower lobe atelectasis and small left effusion.  IMPRESSION: Improved aeration in the lung apices bilaterally. Improvement in left lower lobe atelectasis.   Electronically Signed   By: Franchot Gallo M.D.   On: 03/06/2013 07:48   Dg Chest 2 View  02/25/2013   CLINICAL DATA:  Post CABG and AVR.  EXAM: CHEST  2 VIEW  COMPARISON:  02/24/2013  FINDINGS: Two views of the chest demonstrate increased airspace densities in the left upper lung. Again noted are airspace densities in the right upper  lung and left lung base. Evidence for small pleural effusions. No evidence for a pneumothorax. Heart size is within normal limits. Compression deformities in the thoracic spine. Post cardiac surgery changes.  IMPRESSION: Bilateral patchy airspace disease with progression in the left upper lung. Differential diagnosis includes pneumonia versus asymmetric edema.  Small bilateral pleural effusions.   Electronically Signed   By: Markus Daft M.D.   On: 02/25/2013 08:18   Dg Chest 2 View  02/24/2013   CLINICAL DATA:  Weakness.  CABG.  EXAM: CHEST  2 VIEW  COMPARISON:  02/23/2013.  08/26/2012.  FINDINGS: Interim removal of right jugular sheath. Noted on today's examination are patchy pulmonary infiltrates in both upper lobes and left lower lobe. Pneumonia could present in this fashion. Small left pleural effusion. Prior CABG. Cardiac valve replacement. There is cardiomegaly. Heart size and pulmonary vascularity are stable. Diffuse degenerative changes and osteopenia thoracic spine. Stable thoracic spine compression fractures.  IMPRESSION: New onset bilateral upper lobe and left lower lobe pulmonary infiltrates suggesting pneumonia. Associated small left pleural effusion.   Electronically Signed   By: [place]   On: 02/24/2013 08:06   Ct Head Wo Contrast  03/13/2013   CLINICAL DATA:  Altered mental status. Recent cardiac bypass surgery. The patient was discharged to the nursing home yesterday.  EXAM: CT HEAD WITHOUT CONTRAST  TECHNIQUE: Contiguous axial images were obtained from the base of the skull through the vertex without intravenous contrast.  COMPARISON:  CT head without contrast 03/02/2013.  FINDINGS: Remote lacunar infarcts are again noted within the basal ganglia and scratch the remote lacunar infarcts are again noted within the cerebellum. No acute cortical infarct, hemorrhage, or mass lesion is present. The ventricles are proportionate to the degree of atrophy and stable. No significant extra-axial  fluid  collection is present.  The paranasal sinuses and mastoid air cells are clear. The osseous skull is intact.  IMPRESSION: 1. No acute intracranial abnormality or significant interval change. 2. Stable remote lacunar infarcts of the cerebellum.   Electronically Signed   By: Gennette Pac M.D.   On: 03/13/2013 16:03   Ct Head Wo Contrast  03/02/2013   CLINICAL DATA:  Slurred speech and facial droop.  Rule out CVA  EXAM: CT HEAD WITHOUT CONTRAST  TECHNIQUE: Contiguous axial images were obtained from the base of the skull through the vertex without intravenous contrast.  COMPARISON:  MRI 12/12/2012  FINDINGS: Mild atrophy. Negative for acute infarct, hemorrhage, or mass lesion. Hypodensity right occipital white matter consistent with chronic ischemia. No mass effect or midline shift. Small chronic infarcts cerebellum bilaterally.  Calvarium is intact.  IMPRESSION: No acute abnormality.   Electronically Signed   By: Marlan Palau M.D.   On: 03/02/2013 09:24   Ct Chest Wo Contrast  02/26/2013   CLINICAL DATA:  Low grade fever and shortness of breath.  EXAM: CT CHEST WITHOUT CONTRAST  TECHNIQUE: Multidetector CT imaging of the chest was performed following the standard protocol without IV contrast.  COMPARISON:  Multiple prior chest x-rays  FINDINGS: The chest wall is unremarkable. No supraclavicular or axillary mass or adenopathy. The thyroid gland is grossly normal. The bony thorax is intact. Surgical changes from bypass surgery are noted.  The heart is normal in size. No pericardial effusion. A prosthetic aortic valve is noted along with surgical changes from bypass surgery and dense coronary artery calcifications. The esophagus is grossly normal. Dense aortic calcifications but no focal aneurysm.  There are moderate to large bilateral pleural effusions. There is dense upper lobe airspace consolidation with air bronchograms and more patchy ill-defined airspace opacities in the lower lobes bilaterally, left  greater than right. No definite findings for pulmonary edema.  The trachea appears normal. The mainstem bronchi are somewhat compressed and slit-like. No bronchiectasis.  The upper abdomen is unremarkable. Dense aortic calcifications are noted. Eighty upper pole renal cyst is noted.  IMPRESSION: Moderate bilateral pleural effusions and bilateral infiltrates/pneumonia.  Stable surgical changes from bypass surgery and aortic valve replacement surgery.  The mainstem bronchi are somewhat narrowed.   Electronically Signed   By: Loralie Champagne M.D.   On: 02/26/2013 11:22   Dg Chest Port 1 View  03/05/2013   CLINICAL DATA:  Status post CABG.  EXAM: PORTABLE CHEST - 1 VIEW  COMPARISON:  03/04/2013  FINDINGS: There is significantly improved aeration of both lungs with some residual atelectasis remaining in the left lower lobe. No overt edema or evidence of pneumothorax. No significant pleural effusions. The heart size and mediastinal contours are stable.  IMPRESSION: Significantly improved aeration of both lungs.   Electronically Signed   By: Irish Lack M.D.   On: 03/05/2013 08:24   Dg Chest Port 1 View  03/04/2013   CLINICAL DATA:  Pulmonary infiltrates.  EXAM: PORTABLE CHEST - 1 VIEW  COMPARISON:  03/03/2013 .  FINDINGS: Left PICC line in stable position. Persistent bilateral upper lobe and left lower lobe infiltrates are present. These appear increased in prominence from prior exam. Prior CABG. Cardiomegaly with pulmonary venous congestion and diffuse interstitial prominence with left pleural effusion noted. These findings are consistent with congestive heart failure. These findings are new. No pneumothorax or acute osseous abnormality.  IMPRESSION: 1. Stable PICC line position. 2. Persistent bilateral upper lobe and left lower lobe pulmonary infiltrates  which increased slightly from prior exam. 3. Prior CABG. New onset congestive heart failure with pulmonary interstitial edema and left-sided pleural effusion.    Electronically Signed   By: Marcello Moores  Register   On: 03/04/2013 07:31   Dg Chest Port 1 View  03/03/2013   CLINICAL DATA:  CABG.  EXAM: PORTABLE CHEST - 1 VIEW  COMPARISON:  03/02/2013.  FINDINGS: Central line noted in stable position. PICC line noted in stable position. Stable cardiomegaly. Prior CABG . Persistent bilateral upper lobe and left lower lobe infiltrates are present. Left-sided pleural effusion is present. No pneumothorax.  IMPRESSION: Persistent bilateral upper lobe and left lower lobe infiltrates.   Electronically Signed   By: Marcello Moores  Register   On: 03/03/2013 08:39   Dg Chest Port 1 View  03/02/2013   CLINICAL DATA:  Status post CABG.  EXAM: PORTABLE CHEST - 1 VIEW  COMPARISON:  03/01/2013  FINDINGS: There is improved aeration of the left lung with upper and lower lung infiltrates remaining. The right upper lobe infiltrate is slightly more dense. No large pleural effusions are identified. The heart size is stable. There is stable positioning of a left-sided PICC line.  IMPRESSION: Improved aeration of the left lung with residual upper and lower lung infiltrates. Slightly more dense right upper lobe infiltrate.   Electronically Signed   By: Aletta Edouard M.D.   On: 03/02/2013 07:52   Dg Chest Port 1 View  03/01/2013   CLINICAL DATA:  ARDS  EXAM: PORTABLE CHEST - 1 VIEW  COMPARISON:  02/28/2013  FINDINGS: Previous coronary bypass changes noted. Mild cardiomegaly with vascular congestion. Persistent bilateral upper lobe and left lower lobe airspace opacities. Minimal right base atelectasis. Small effusions remain. No significant interval change. No pneumothorax.  IMPRESSION: Stable asymmetric bilateral airspace process compatible with ARDS or pneumonia. No pneumothorax.   Electronically Signed   By: Daryll Brod M.D.   On: 03/01/2013 09:05   Dg Chest Port 1 View  02/28/2013   CLINICAL DATA:  ARDS  EXAM: PORTABLE CHEST - 1 VIEW  COMPARISON:  02/27/2013  FINDINGS: Prior coronary bypass  changes noted. Left PICC line tip at the SVC RA junction. Atherosclerosis present of the aorta. Asymmetric Stable airspace process throughout both lungs, most pronounced in the upper lobes and in the left lower lobe. No enlarging effusion or pneumothorax. No significant interval change.  IMPRESSION: Stable bilateral airspace process in the upper lobes and left lower lobe compatible with ARDS versus pneumonia.   Electronically Signed   By: Daryll Brod M.D.   On: 02/28/2013 08:22   Dg Chest Port 1 View  02/27/2013   CLINICAL DATA:  Shortness of breath, ARDS.  EXAM: PORTABLE CHEST - 1 VIEW  COMPARISON:  February 26, 2013  FINDINGS: The heart size and mediastinal contours are stable. Heart size is enlarged. Airspace consolidation of bilateral upper lobes and the left lung base not changed. The visualized skeletal structures are stable.  IMPRESSION: Persistent airspace infiltrates in bilateral upper lobes and in the left lung base unchanged compared to prior exam, favor pneumonia   Electronically Signed   By: Abelardo Diesel M.D.   On: 02/27/2013 07:58   Dg Chest Port 1 View  02/26/2013   CLINICAL DATA:  Pneumonitis, COPD, asthma  EXAM: PORTABLE CHEST - 1 VIEW  COMPARISON:  Portable exam [id number] hr compared to 02/25/2013  FINDINGS: Enlargement of cardiac silhouette post CABG and AVR.  Atherosclerotic calcification aorta.  Patchy airspace pulmonary infiltrates diffusely in both lungs,  greatest in upper lobes, question pneumonia or less likely edema.  No pleural effusion or pneumothorax.  Bones demineralized.  IMPRESSION: Enlargement of cardiac silhouette post CABG and AVR.  Persistent bilateral airspace infiltrates greatest in the upper lobes, favor pneumonia over edema.   Electronically Signed   By: Lavonia Dana M.D.   On: 02/26/2013 16:49   Dg Chest Port 1 View  02/23/2013   CLINICAL DATA:  Followup coronary bypass grafting  EXAM: PORTABLE CHEST - 1 VIEW  COMPARISON:  02/22/2013  FINDINGS: Cardiac shadow is stable.  Postoperative changes are again seen. A right jugular sheath is again noted and stable. Continued improved aeration is noted. No pneumothorax is seen. No significant pleural effusion is noted.  IMPRESSION: Postoperative changes.  No acute abnormality is noted.   Electronically Signed   By: Inez Catalina M.D.   On: 02/23/2013 07:43   Dg Chest Port 1 View  02/22/2013   CLINICAL DATA:  Status post cardiac surgery.  EXAM: PORTABLE CHEST - 1 VIEW  COMPARISON:  Chest x-ray 02/21/2013.  FINDINGS: Previously noted left-sided chest tube has been removed. Likewise, the previously noted Swan-Ganz catheter has been removed, while the right IJ central venous Cordis remains in position with tip terminating in the proximal superior vena cava. Previously noted mediastinal/ pericardial drain has also been removed. Lung volumes remain low, and there are persistent bibasilar opacities, presumably postoperative subsegmental atelectasis. Small left pleural effusion. No appreciable left pneumothorax following removal of chest tube. No evidence of pulmonary edema. Mild enlargement of the cardiopericardial silhouette, similar to the prior study and within normal limits in this postoperative patient. Atherosclerosis in the thoracic aorta. Status post median sternotomy for CABG.  IMPRESSION: 1. Postoperative changes and support apparatus, as above. 2. Small left pleural effusion. No significant pneumothorax following removal of left-sided chest tube. 3. Persistent bibasilar opacities in the setting of low lung volumes in this postoperative patient presumably represent postoperative subsegmental atelectasis.   Electronically Signed   By: Vinnie Langton M.D.   On: 02/22/2013 09:28   Dg Chest Portable 1 View In Am  02/21/2013   CLINICAL DATA:  Status post CABG and aortic valve replacement  EXAM: PORTABLE CHEST - 1 VIEW  COMPARISON:  Prior chest x-ray 02/20/2013  FINDINGS: The patient is heavily rotated to the right. The tip of the  Swan-Ganz catheter appears to have advanced and is now in the mid to distal right lower lobe pulmonary artery. The patient has been extubated and the nasogastric tube removed. Subxiphoid mediastinal drain and left thoracostomy tube remain in unchanged position. Patient is status post median sternotomy with evidence of aortic valve replacement and multivessel CABG. Atherosclerotic calcifications noted in the transverse aorta. Slightly lower inspiratory volumes with increased left basilar atelectasis. No overt pulmonary edema. No pneumothorax.  IMPRESSION: 1. The the tip of the Swan-Ganz catheter has advanced and is now in the mid to distal right lower lobe pulmonary artery. Recommend withdrawing this at least 3 cm to avoid right lower lobe pulmonary artery injury during balloon inflation. 2. Interval extubation with resultant slightly lower inspiratory volumes and increased left basilar atelectasis.   Electronically Signed   By: Jacqulynn Cadet M.D.   On: 02/21/2013 08:32   Dg Chest Portable 1 View  02/20/2013   CLINICAL DATA:  Postop  EXAM: PORTABLE CHEST - 1 VIEW  COMPARISON:  02/10/2013  FINDINGS: Mild cardiac enlargement similar to prior study. Endotracheal tube 3.9 cm above carina. Right Swan-Ganz catheter tip in right main  pulmonary artery. NG tube into the stomach. Left chest tube. Mediastinal drain. Prosthetic cardiac valve noted. Mitral annular calcification noted. Right lung is clear. On the left side, there is perihilar opacity. There is a small left pleural effusion. No significant pneumothorax.  IMPRESSION: Lines and tubes as described above.  Postoperative change, with small left effusion, left lower lobe consolidation likely representing atelectasis, and left perihilar opacity possibly representing asymmetric pulmonary edema, mild.   Electronically Signed   By: Skipper Cliche M.D.   On: 02/20/2013 14:40   Dg Swallowing Func-speech Pathology  02/26/2013   Orbie Pyo [place], CCC-SLP     02/26/2013   3:03 PM Objective Swallowing Evaluation: Modified Barium Swallowing Study   Patient Details  Name: NURI BILLEY MRN: [id number] Date of Birth: May 04, 1930  Today's Date: 02/26/2013 Time: [id number] SLP Time Calculation (min): 27 min  Past Medical History:  Past Medical History  Diagnosis Date  . Anemia   . Colon polyp   . COPD (chronic obstructive pulmonary disease)     Astmatic bronchitis  . Asthma with bronchitis   . Gout     PMH  . Osteoporosis   . Allergic rhinitis   . Skin cancer, basal cell     Dr [place] Crane, PMH  . Nephrolithiasis   . HLD (hyperlipidemia)   . Skin cancer (melanoma) 2012    Right neck  . Arthritis     SHOULDER  . Headache(784.0)   . RLS (restless legs syndrome)   . Dysrhythmia 08/2012    NEW ONSET ATRIAL FIBRILATION   Past Surgical History:  Past Surgical History  Procedure Laterality Date  . Finger surgery      for foreign body  . Colonoscopy w/ polypectomy      Dr Deatra Ina  . Inguinal hernia repair    . Nose surgery      Septal Deviation  . Neck surgery  12/2010    Melanoma ; [facility]   . Leg surgery  06/2010     Dr.Lawson, for blood clott. Left leg   . Tee without cardioversion N/A 01/23/2013    Procedure: TRANSESOPHAGEAL ECHOCARDIOGRAM (TEE);  Surgeon:  Jolaine Artist, MD;  Location: [facility] ENDOSCOPY;  Service:  Cardiovascular;  Laterality: N/A;  sign heart cath consent w/tee  consent  . Coronary artery bypass graft N/A 02/20/2013    Procedure: CORONARY ARTERY BYPASS GRAFTING (CABG);  Surgeon:  Ivin Poot, MD;  Location: [place];  Service: Open Heart  Surgery;  Laterality: N/A;  . Aortic valve replacement N/A 02/20/2013    Procedure: AORTIC VALVE REPLACEMENT (AVR);  Surgeon: Ivin Poot, MD;  Location: [facility];  Service: Open Heart Surgery;   Laterality: N/A;  . Intraoperative transesophageal echocardiogram N/A 02/20/2013    Procedure: INTRAOPERATIVE TRANSESOPHAGEAL ECHOCARDIOGRAM;   Surgeon: Ivin Poot, MD;  Location: [facility];  Service: Open  Heart Surgery;  Laterality: N/A;   HPI:  80 yr  old underwent CABG x3 and aortic valve replacement.    Associated small left pleural effusion.  Now with Altered mental  status confusion.  CXR New onset bilateral upper lobe and left  lower lobe pulmonary infiltrates suggesting pneumonia.  Lung  status worsening and suspicion of aspiration, therefore SLP  recommended MBS to fully assess.     Assessment / Plan / Recommendation Clinical Impression  Dysphagia Diagnosis: Mild oral phase dysphagia;Mild pharyngeal  phase dysphagia;Moderate pharyngeal phase dysphagia Clinical impression: Pt. exhibited mild oral dysphagia with  delayed transit exacerbated by  xerostomia and decreased  endurance.  Mild-mod sensory pharyngeal dysphagia with reduced  tongue base retraction and laryngeal elevation resulting in trace  vallecular/pyriform sinus/pharyngeal wall residue.  Observed one  episode of flash laryngeal penetration with thin with increased  work of breathing throughout study.  SLP educated pt. on  importance with clinical reasoning of swallow precautions (do not  eat if increased SOB, frequent rest breaks, small sips, cup sips  preferred).  Recommend continue regular texture, thin liquids  following swallow precautions with continued ST for reiteration  and safety with po's.      Treatment Recommendation  Therapy as outlined in treatment plan below    Diet Recommendation Regular;Thin liquid   Liquid Administration via: Cup;No straw Medication Administration: Whole meds with puree Supervision: Patient able to self feed;Intermittent supervision  to cue for compensatory strategies Compensations: Slow rate;Small sips/bites Postural Changes and/or Swallow Maneuvers: Seated upright 90  degrees;Upright 30-60 min after meal    Other  Recommendations Oral Care Recommendations: Oral care BID   Follow Up Recommendations  None    Frequency and Duration min 2x/week  2 weeks   Pertinent Vitals/Pain WDL         Reason for Referral Objectively evaluate swallowing function   Oral Phase  Oral Preparation/Oral Phase Oral Phase: Impaired Oral - Honey Oral - Honey Teaspoon: Delayed oral transit;Weak lingual  manipulation Oral - Nectar Oral - Nectar Cup: Delayed oral transit;Weak lingual manipulation Oral - Solids Oral - Puree: Delayed oral transit;Weak lingual manipulation   Pharyngeal Phase Pharyngeal Phase Pharyngeal Phase: Impaired Pharyngeal - Honey Pharyngeal - Honey Teaspoon: Pharyngeal residue -  valleculae;Reduced tongue base retraction Pharyngeal - Nectar Pharyngeal - Nectar Teaspoon: Delayed swallow  initiation;Premature spillage to valleculae Pharyngeal - Nectar Cup: Delayed swallow initiation;Premature  spillage to pyriform sinuses;Pharyngeal residue -  valleculae;Reduced tongue base retraction;Premature spillage to  valleculae Pharyngeal - Thin Pharyngeal - Thin Teaspoon: Delayed swallow initiation;Premature  spillage to valleculae Pharyngeal - Thin Cup: Premature spillage to valleculae;Delayed  swallow initiation;Premature spillage to pyriform  sinuses;Pharyngeal residue - valleculae;Reduced tongue base  retraction;Pharyngeal residue - pyriform  sinuses;Penetration/Aspiration during swallow (trace residue) Penetration/Aspiration details (thin cup): Material enters  airway, remains ABOVE vocal cords and not ejected out Pharyngeal - Thin Straw: Delayed swallow initiation;Premature  spillage to pyriform sinuses;Pharyngeal residue -  valleculae;Reduced tongue base retraction Pharyngeal - Solids Pharyngeal - Puree: Pharyngeal residue - valleculae;Reduced  tongue base retraction;Pharyngeal residue - posterior pharnyx  Cervical Esophageal Phase    GO    Cervical Esophageal Phase Cervical Esophageal Phase: Darryll Capers         Cranford Mon.Ed CCC-SLP Pager [phone]  02/26/2013     PERTINENT LAB RESULTS: CBC:  Recent Labs  03/16/13 0335  WBC 8.6  HGB 8.3*  HCT 25.5*  PLT 267   CMET CMP     Component Value Date/Time   NA 136* 03/16/2013 0335   K 4.5 03/16/2013 0335   CL 103  03/16/2013 0335   CO2 20 03/16/2013 0335   GLUCOSE 136* 03/16/2013 0335   BUN 23 03/16/2013 0335   CREATININE 1.18 03/16/2013 0335   CREATININE 1.31 07/31/2012 1520   CALCIUM 8.3* 03/16/2013 0335   PROT 6.8 03/14/2013 0411   ALBUMIN 2.1* 03/14/2013 0411   AST 24 03/14/2013 0411   ALT 36 03/14/2013 0411   ALKPHOS 133* 03/14/2013 0411   BILITOT 0.4 03/14/2013 0411   GFRNONAA 56* 03/16/2013 0335   GFRAA 65* 03/16/2013 0335    GFR Estimated Creatinine  Clearance: 43.4 ml/min (by C-G formula based on Cr of 1.18). No results found for this basename: LIPASE, AMYLASE,  in the last 72 hours No results found for this basename: CKTOTAL, CKMB, CKMBINDEX, TROPONINI,  in the last 72 hours No components found with this basename: POCBNP,  No results found for this basename: DDIMER,  in the last 72 hours  Recent Labs  03/15/13 1030  HGBA1C 6.4*   No results found for this basename: CHOL, HDL, LDLCALC, TRIG, CHOLHDL, LDLDIRECT,  in the last 72 hours No results found for this basename: TSH, T4TOTAL, FREET3, T3FREE, THYROIDAB,  in the last 72 hours No results found for this basename: VITAMINB12, FOLATE, FERRITIN, TIBC, IRON, RETICCTPCT,  in the last 72 hours Coags:  Recent Labs  03/16/13 0335 03/17/13 0410  INR 1.78* 1.68*   Microbiology: Recent Results (from the past 240 hour(s))  URINE CULTURE     Status: None   Collection Time    03/13/13  5:28 PM      Result Value Range Status   Specimen Description URINE, CLEAN CATCH   Final   Special Requests NONE   Final   Culture  Setup Time 03/13/2013 19:05   Final   Colony Count >=100,000 COLONIES/ML   Final   Culture ENTEROBACTER CLOACAE   Final   Report Status 03/17/2013 FINAL   Final   Organism ID, Bacteria ENTEROBACTER CLOACAE   Final  CULTURE, BLOOD (ROUTINE X 2)     Status: None   Collection Time    03/13/13  7:45 PM      Result Value Range Status   Specimen Description BLOOD HAND RIGHT   Final   Special Requests BOTTLES DRAWN AEROBIC AND  ANAEROBIC 10CC   Final   Culture  Setup Time     Final   Value: 03/14/2013 01:09     Performed at [facility]   Culture     Final   Value:        BLOOD CULTURE RECEIVED NO GROWTH TO DATE CULTURE WILL BE HELD FOR 5 DAYS BEFORE ISSUING A FINAL NEGATIVE REPORT     Performed at [facility]   Report Status PENDING   Incomplete  CULTURE, BLOOD (ROUTINE X 2)     Status: None   Collection Time    03/13/13  7:50 PM      Result Value Range Status   Specimen Description BLOOD HAND LEFT   Final   Special Requests BOTTLES DRAWN AEROBIC ONLY 10CC   Final   Culture  Setup Time     Final   Value: 03/14/2013 01:10     Performed at [facility]   Culture     Final   Value:        BLOOD CULTURE RECEIVED NO GROWTH TO DATE CULTURE WILL BE HELD FOR 5 DAYS BEFORE ISSUING A FINAL NEGATIVE REPORT     Performed at [facility]   Report Status PENDING   Incomplete     [place]:  Acute metabolic encephalopathy  -resolved and back to baseline. Patient to be deconditioned and weak, will require home health services on discharge. -secondary to UTI  -CT head neg on 1/23   UTI  - On admission started on Zosyn, mental status significantly improved. Patient is now afebrile, leukocytosis has completely resolved - Urine c/x positive for enterobacter sensitive to fluoroquinolones, will be discharged on Levaquin that should cover both UTI and possible pneumonia  Suspected HCAP  -  afebrile, leukocytosis has resolved.Encephalopathy resolved  -on Zosyn-since 1/23-will transition to  levofloxacin on discharge   Oral thrush  -on Difulcan since 1/23-plan on 10 day course   Anemia  -secondary to recent illness/acute illness  -no overt evidence of blood loss  -c/w Fe supplementation  -monitor Hb periodically, hopefully at next visit with PCP  Atrial fibrillation.  -rate controlled with cardizem and metoprolol  -Coumadin dosed per pharmacy while inpatient, INR still  subtherapeutic at the time of discharge, will be on Levaquin and fluconazole both which can interfere with INR. After discussion with the pharmacy team, current recommendations are for a 4 mg dose today, then 1 mg daily from tomorrow onwards. Have asked case management to make the home health RN aware that PT/INR should be drawn on 1/30 and have those results faxed out [facility]. Further dosing of Coumadin will be done by the Coumadin clinic.  Hx of CAD-Recent CABG and aortic valve replacement (tissue valve) on 02/20/13  -stable-sternotomy wound looks clean, no chest pain  -on ASA, Statins and Metoprolol  -coumadin as above  Dysphagia  -suspect 2/2 deconditioning  -SLP recommending Dys 2 diet, spoke with spouse at bedside on 1/26-willing to accept risk of aspiration  -will require outpatient SLP eval-will have case management arrange for home health services   History of COPD/Asthma  -lungs clear  -continue with Symbicort   Dyslipidemia  -continue on statin   DM  - Was on SSI while inpatient -A1C on 1/25-6.4, deferred to primary care practitioner whether or not to start oral hypoglycemic agents.   TODAY-DAY OF DISCHARGE:  Subjective:   Robert Richard today has no headache,no chest abdominal pain,no new weakness tingling or numbness, feels much better wants to go home today.   Objective:   Blood pressure 122/68, pulse 74, temperature 97.5 F (36.4 C), temperature source Oral, resp. rate 20, height 6' 0.84" (1.85 m), weight 61.5 kg (135 lb 9.3 oz), SpO2 96.00%.  Intake/Output Summary (Last 24 hours) at 03/17/13 1155 Last data filed at 03/17/13 1043  Gross per 24 hour  Intake   1010 ml  Output   2151 ml  Net  -1141 ml   Filed Weights   03/13/13 1921 03/13/13 2031 03/17/13 0309  Weight: 62.9 kg (138 lb 10.7 oz) 61.9 kg (136 lb 7.4 oz) 61.5 kg (135 lb 9.3 oz)    Exam Awake Alert, Oriented *3, No new F.N deficits, Normal affect [place].AT,PERRAL Supple Neck,No JVD, No  cervical lymphadenopathy appriciated.  Symmetrical Chest wall movement, Good air movement bilaterally, CTAB RRR,No Gallops,Rubs or new Murmurs, No Parasternal Heave +ve B.Sounds, Abd Soft, Non tender, No organomegaly appriciated, No rebound -guarding or rigidity. No Cyanosis, Clubbing or edema, No new Rash or bruise  DISCHARGE CONDITION: Stable  DISPOSITION: Home with home health services   DISCHARGE INSTRUCTIONS:    Activity:  As tolerated with Full fall precautions use walker/cane & assistance as needed  Diet recommendation: Diabetic Diet Heart Healthy diet Dysphagia 2 Aspiration precautions:yes  Discharge Orders   Future Appointments Provider Department Dept Phone   04/01/2013 1:30 PM Ivin Poot, MD [facility] [phone]   Future Orders Complete By Expires   Call MD for:  persistant nausea and vomiting  As directed    Call MD for:  redness, tenderness, or signs of infection (pain, swelling, redness, odor or green/yellow discharge around incision site)  As directed    Call MD for:  severe uncontrolled pain  As directed  Diet - low sodium heart healthy  As directed    Scheduling Instructions:     Dysphagia 2 diet. Standard  aspiration precautions.   Increase activity slowly  As directed       Follow-up Information   Follow up with Unice Cobble, MD. Schedule an appointment as soon as possible for a visit in 1 week.   Specialty:  Internal Medicine   Contact information:   [phone] [facility] [place] [address] [phone]       Follow up with Len Childs, MD On 04/01/2013. (appt at 1:30 pm)    Specialty:  Cardiothoracic Surgery   Contact information:   [address] [place] [address] [phone]       Total Time spent on discharge equals 45 minutes.  SignedOren Binet 03/17/2013 11:55 AM

## 2013-03-17 NOTE — Progress Notes (Signed)
[place] for :  Coumadin ; Vancomycin, Zosyn Indication:  atrial fibrillation ; Sepsis, presumed HCAP, enterobacter UTI  Dosing weight 62 kg  Labs: Lab Results  Component Value Date   CULT  Value:        BLOOD CULTURE RECEIVED NO GROWTH TO DATE CULTURE WILL BE HELD FOR 5 DAYS BEFORE ISSUING A FINAL NEGATIVE REPORT Performed at [facility] 03/13/2013   CULT  Value:        BLOOD CULTURE RECEIVED NO GROWTH TO DATE CULTURE WILL BE HELD FOR 5 DAYS BEFORE ISSUING A FINAL NEGATIVE REPORT Performed at [facility] 03/13/2013   CULT, URINE ENTEROBACTER CLOACAE *   *   *  Sensitive to: Cipro Levaquin Septra/Bactrim 03/13/2013   sensitive to  Recent Labs  03/15/13 0345 03/15/13 0802 03/16/13 0335 03/17/13 0410  HGB  --   --  8.3*  --   HCT  --   --  25.5*  --   PLT  --   --  267  --   CREATININE  --  1.19 1.18  --    Lab Results  Component Value Date   INR 1.68* 03/17/2013   INR 1.78* 03/16/2013   INR 1.93* 03/15/2013   Estimated Creatinine Clearance: 43.4 ml/min (by C-G formula based on Cr of 1.18).  Current Medication[s] Include: Scheduled:  Scheduled:  . aspirin EC  81 mg Oral Daily  . budesonide-formoterol  2 puff Inhalation BID  . busPIRone  7.5 mg Oral BID  . calcium-vitamin D  1 tablet Oral Daily  . cholecalciferol  1,000 Units Oral Daily  . diltiazem  240 mg Oral Daily  . famotidine  20 mg Oral Daily  . feeding supplement (ENSURE COMPLETE)  237 mL Oral BID BM  . ferrous sulfate  325 mg Oral Q breakfast  . fluconazole  50 mg Oral Daily  . fluticasone  1 spray Each Nare Daily  . insulin aspart  0-5 Units Subcutaneous QHS  . insulin aspart  0-9 Units Subcutaneous TID WC  . ketotifen  1 drop Both Eyes Daily  . loratadine  10 mg Oral Daily  . metoprolol tartrate  25 mg Oral BID  . multivitamin with minerals  1 tablet Oral Daily  . nystatin  5 mL Oral QID  . piperacillin-tazobactam (ZOSYN)  IV  3.375 g Intravenous Q8H  .  polyethylene glycol  17 g Oral Daily  . pravastatin  40 mg Oral q1800  . sodium chloride  3 mL Intravenous Q12H  . topiramate  75 mg Oral Daily  . vancomycin  500 mg Intravenous Q12H  . Warfarin - Pharmacist Dosing Inpatient   Does not apply q1800   Infusion[s]: Infusions:  None Antibiotic[s]: Anti-infectives   Start     Dose/Rate Route Frequency Ordered Stop   03/14/13 1000  fluconazole (DIFLUCAN) tablet 50 mg     50 mg Oral Daily 03/13/13 2027     03/14/13 0800  vancomycin (VANCOCIN) 500 mg in sodium chloride 0.9 % 100 mL IVPB     500 mg 100 mL/hr over 60 Minutes Intravenous Every 12 hours 03/13/13 2028     03/14/13 0400  piperacillin-tazobactam (ZOSYN) IVPB 3.375 g     3.375 g 12.5 mL/hr over 240 Minutes Intravenous Every 8 hours 03/13/13 2028     03/13/13 1830  vancomycin (VANCOCIN) IVPB 1000 mg/200 mL premix     1,000 mg 200 mL/hr over 60 Minutes Intravenous  Once  03/13/13 1824 03/14/13 0335   03/13/13 1830  piperacillin-tazobactam (ZOSYN) IVPB 3.375 g     3.375 g 100 mL/hr over 30 Minutes Intravenous  Once 03/13/13 1824 03/13/13 2031      Assessment:  Day # 4 of Vancomycin, Day # 5 Zosyn for Sepsis, presumed HCAP.   Urine culture reports Enterobacter ss to Cipro, Levoquin and Septra/Bactrim..   Patient also on oral fluconazole for thrush.    Renal function stable.   Continuing on Coumadin for history of atrial fibrillation and tissue AVR.   INR down despite dose increases.  No overt bleeding complications noted    Goal of Therapy:   INR 2-3  Vancomycin trough 15 - 20 mcg/ml   Plan/Recommendations:  1. Coumadin 4 mg po today.  Patient remains on fluconazole; will watch for large jump in INR while on fluconazole. 2. Recommend changing IV antibiotics to oral antibiotic, i.e. Cipro, Levaquin. Daily INR's, CBC.   Gerrianne Aydelott, Craig Guess, Pharm.D. 03/17/2013, 11:01 AM

## 2013-03-20 ENCOUNTER — Ambulatory Visit (INDEPENDENT_AMBULATORY_CARE_PROVIDER_SITE_OTHER): Payer: Medicare Other | Admitting: Cardiovascular Disease

## 2013-03-20 DIAGNOSIS — Z5181 Encounter for therapeutic drug level monitoring: Secondary | ICD-10-CM

## 2013-03-20 DIAGNOSIS — Z48812 Encounter for surgical aftercare following surgery on the circulatory system: Secondary | ICD-10-CM

## 2013-03-20 LAB — CULTURE, BLOOD (ROUTINE X 2)
Culture: NO GROWTH
Culture: NO GROWTH

## 2013-03-20 LAB — POCT INR: INR: 1.7

## 2013-03-22 NOTE — Discharge Summary (Signed)
patient examined and medical record reviewed,agree with above note. Robert Richard,Robert Richard 03/22/2013   

## 2013-03-23 ENCOUNTER — Other Ambulatory Visit (INDEPENDENT_AMBULATORY_CARE_PROVIDER_SITE_OTHER): Payer: Medicare Other

## 2013-03-23 ENCOUNTER — Ambulatory Visit (INDEPENDENT_AMBULATORY_CARE_PROVIDER_SITE_OTHER): Payer: Medicare Other | Admitting: Internal Medicine

## 2013-03-23 ENCOUNTER — Encounter: Payer: Self-pay | Admitting: Internal Medicine

## 2013-03-23 VITALS — BP 144/60 | HR 75 | Temp 96.6°F | Wt 147.0 lb

## 2013-03-23 DIAGNOSIS — N390 Urinary tract infection, site not specified: Secondary | ICD-10-CM

## 2013-03-23 DIAGNOSIS — J189 Pneumonia, unspecified organism: Secondary | ICD-10-CM | POA: Insufficient documentation

## 2013-03-23 DIAGNOSIS — D649 Anemia, unspecified: Secondary | ICD-10-CM

## 2013-03-23 DIAGNOSIS — E871 Hypo-osmolality and hyponatremia: Secondary | ICD-10-CM

## 2013-03-23 DIAGNOSIS — I4891 Unspecified atrial fibrillation: Secondary | ICD-10-CM

## 2013-03-23 LAB — BASIC METABOLIC PANEL WITH GFR
BUN: 23 mg/dL (ref 6–23)
CO2: 22 meq/L (ref 19–32)
Calcium: 9 mg/dL (ref 8.4–10.5)
Chloride: 109 meq/L (ref 96–112)
Creatinine, Ser: 1.2 mg/dL (ref 0.4–1.5)
GFR: 59.49 mL/min — ABNORMAL LOW
Glucose, Bld: 84 mg/dL (ref 70–99)
Potassium: 4.5 meq/L (ref 3.5–5.1)
Sodium: 138 meq/L (ref 135–145)

## 2013-03-23 LAB — CBC WITH DIFFERENTIAL/PLATELET
Basophils Absolute: 0 10*3/uL (ref 0.0–0.1)
Basophils Relative: 0.4 % (ref 0.0–3.0)
Eosinophils Absolute: 0.2 10*3/uL (ref 0.0–0.7)
Eosinophils Relative: 1.7 % (ref 0.0–5.0)
HCT: 33.6 % — ABNORMAL LOW (ref 39.0–52.0)
Hemoglobin: 10.9 g/dL — ABNORMAL LOW (ref 13.0–17.0)
Lymphocytes Relative: 23.9 % (ref 12.0–46.0)
Lymphs Abs: 2.7 10*3/uL (ref 0.7–4.0)
MCHC: 32.4 g/dL (ref 30.0–36.0)
MCV: 88.8 fl (ref 78.0–100.0)
Monocytes Absolute: 1.7 10*3/uL — ABNORMAL HIGH (ref 0.1–1.0)
Monocytes Relative: 14.7 % — ABNORMAL HIGH (ref 3.0–12.0)
Neutro Abs: 6.8 10*3/uL (ref 1.4–7.7)
Neutrophils Relative %: 59.3 % (ref 43.0–77.0)
Platelets: 312 10*3/uL (ref 150.0–400.0)
RBC: 3.79 Mil/uL — ABNORMAL LOW (ref 4.22–5.81)
RDW: 15.9 % — ABNORMAL HIGH (ref 11.5–14.6)
WBC: 11.4 10*3/uL — ABNORMAL HIGH (ref 4.5–10.5)

## 2013-03-23 NOTE — Assessment & Plan Note (Signed)
See order(s).

## 2013-03-23 NOTE — Progress Notes (Signed)
Pre visit review using our clinic review tool, if applicable. No additional management support is needed unless otherwise documented below in the visit note. 

## 2013-03-23 NOTE — Patient Instructions (Signed)
  Please employ incentive  spirometry every 2 hours while awake to help improve aeration in the left lower lobe. Please complete the chest x-ray 2/11 as scheduled. Your next office appointment will be determined based upon review of your pending labs & x-rays. Those instructions will be transmitted to you by mail . Followup as needed for your this acute issue. Please report any significant change in your symptoms.

## 2013-03-23 NOTE — Progress Notes (Signed)
   Subjective:    Patient ID: Robert Richard, male    DOB: [birth year], 80 y.o.   MRN: [id number]  [facility] records reviewed and summarized in the problem list. He is completing Levaquin for the Enterobacter urinary tract infection. & probable HCAP. He has taken 2 courses of nystatin for oral thrush with suboptimal response.  He was discharged on Levaquin 750 mg every other day. He has 2 pills left  He does have incentive spirometer and is employing it at home. The x-rays were reviewed personally. There was a significant left lower lobe infiltrate and effusion present 1/26.  Review of Systems  He describes being very weak; he has intermittent chest wall pain  He specifically denies fever, chills, or sweats  He states that the thrush was severe enough that he had difficulty eating  He denies frontal headache, facial pain, or nasal purulence  He did cough up some yellow sputum.  He denies any diarrhea.  He also denies dysuria, pyuria, or hematuria.     Objective:   Physical Exam Gen.: thin but adequately nourished in appearance. Alert, appropriate and cooperative throughout exam.Appears younger than stated age .Appears fatigued Head: Normocephalic without obvious abnormalities  Eyes: No corneal or conjunctival inflammation noted.  Ears: External  ear exam reveals no significant lesions or deformities. Canals clear .TMs normal. Hearing is grossly normal bilaterally. Nose: External nasal exam reveals no deformity or inflammation. Nasal mucosa are pink and moist. No lesions or exudates noted.  Mouth: Oral mucosa and oropharynx reveal no lesions or exudates. Teeth in good repair.Orophraynx dry;clinically no thrush Neck: No deformities, masses, or tenderness noted.  Lungs: Normal respiratory effort; chest expands symmetrically. LLL rales w/o wheezes or increased work of breathing.Decreased BS generally Heart: Normal rate ;respiratory variation in rhythm. Normal S1 and S2. No gallop,  click, or rub.  Abdomen: Bowel sounds normal; abdomen soft and nontender. No masses, organomegaly or hernias noted.                                 Musculoskeletal/extremities:   Accentuated curvature of  thoracic spine. No clubbing, cyanosis, edema, or significant extremity  deformity noted. Decreased strength generally. Hand joints normal. Fingernail  health good. Able to lie down & sit up with minimal help. Vascular: Carotid, radial artery, dorsalis pedis and  posterior tibial pulses are  Equal but pedal pulses decreased.No bruits present. Neurologic: Alert and oriented x3. Deep tendon reflexes symmetrical and normal.         Skin: Intact without suspicious lesions or rashes. Lymph: No cervical, axillary lymphadenopathy present. Psych: Mood and affect are normal. Normally interactive                                                                                        Assessment & Plan:  See Current Assessment & Plan in Problem List under specific Diagnosis

## 2013-03-23 NOTE — Assessment & Plan Note (Signed)
Repeat Cxray 2/11 @ [facility] office

## 2013-03-24 MED ORDER — WARFARIN SODIUM 1 MG PO TABS: 2.0000 mg | ORAL_TABLET | Freq: Every day | ORAL | Status: DC

## 2013-03-25 ENCOUNTER — Ambulatory Visit (INDEPENDENT_AMBULATORY_CARE_PROVIDER_SITE_OTHER): Payer: Medicare Other | Admitting: Pharmacist

## 2013-03-25 DIAGNOSIS — Z5181 Encounter for therapeutic drug level monitoring: Secondary | ICD-10-CM

## 2013-03-25 LAB — POCT INR: INR: 3.3

## 2013-03-26 ENCOUNTER — Encounter: Payer: Self-pay | Admitting: General Practice

## 2013-03-30 ENCOUNTER — Other Ambulatory Visit: Payer: Self-pay | Admitting: *Deleted

## 2013-03-30 DIAGNOSIS — I2510 Atherosclerotic heart disease of native coronary artery without angina pectoris: Secondary | ICD-10-CM

## 2013-04-01 ENCOUNTER — Encounter: Payer: Self-pay | Admitting: Cardiothoracic Surgery

## 2013-04-01 ENCOUNTER — Ambulatory Visit (INDEPENDENT_AMBULATORY_CARE_PROVIDER_SITE_OTHER): Payer: Self-pay | Admitting: Cardiothoracic Surgery

## 2013-04-01 ENCOUNTER — Ambulatory Visit
Admission: RE | Admit: 2013-04-01 | Discharge: 2013-04-01 | Disposition: A | Discharge status: Home / Self Care | Payer: Medicare Other | Source: Ambulatory Visit | Attending: Cardiothoracic Surgery | Admitting: Cardiothoracic Surgery

## 2013-04-01 ENCOUNTER — Ambulatory Visit (INDEPENDENT_AMBULATORY_CARE_PROVIDER_SITE_OTHER): Payer: Medicare Other | Admitting: Cardiovascular Disease

## 2013-04-01 VITALS — BP 110/60 | HR 66 | Resp 20 | Ht 73.0 in | Wt 147.0 lb

## 2013-04-01 DIAGNOSIS — Z5181 Encounter for therapeutic drug level monitoring: Secondary | ICD-10-CM

## 2013-04-01 DIAGNOSIS — I2510 Atherosclerotic heart disease of native coronary artery without angina pectoris: Secondary | ICD-10-CM

## 2013-04-01 DIAGNOSIS — Z951 Presence of aortocoronary bypass graft: Secondary | ICD-10-CM

## 2013-04-01 LAB — POCT INR: INR: 2.7

## 2013-04-01 NOTE — Progress Notes (Signed)
PCP is Unice Cobble, MD Referring Provider is Sherren Mocha, MD  Chief Complaint  Patient presents with  . Routine Post Op    3 week f/u wtih CXR, S/P CABG x 3 on 02/20/2013    HPI: 80 year old patient returns for his first postop visit after combined aVR-CABG. Patient had a prolonged postop recovery related to underlying COPD, hospital acquired influenza pneumonitis, anemia, urinary incontinence, weakness and deconditioning, mild renal insufficiency, atrial fibrillation. After proximally 5 days at home he was septally readmitted with UTI. He is been at home now for over 10 days and is gradually improving with increased ambulation, improved appetite. He denies angina or symptoms of CHF. The surgical incisions are healing well. His had no bleeding complications from his Coumadin.    Past Medical History  Diagnosis Date  . Anemia   . Colon polyp   . COPD (chronic obstructive pulmonary disease)     Astmatic bronchitis  . Asthma with bronchitis   . Gout     PMH  . Osteoporosis   . Allergic rhinitis   . Skin cancer, basal cell     Dr Nevada Crane, PMH  . Nephrolithiasis   . HLD (hyperlipidemia)   . Skin cancer (melanoma) 2012    Right neck  . Arthritis     SHOULDER  . Headache(784.0)   . RLS (restless legs syndrome)   . Dysrhythmia 08/2012    NEW ONSET ATRIAL FIBRILATION    Past Surgical History  Procedure Laterality Date  . Finger surgery      for foreign body  . Colonoscopy w/ polypectomy      Dr Deatra Ina  . Inguinal hernia repair    . Nose surgery      Septal Deviation  . Neck surgery  12/2010    Melanoma ; [facility]   . Leg surgery  06/2010     Dr.Lawson, for blood clott. Left leg   . Tee without cardioversion N/A 01/23/2013    Procedure: TRANSESOPHAGEAL ECHOCARDIOGRAM (TEE);  Surgeon: Jolaine Artist, MD;  Location: [facility] ENDOSCOPY;  Service: Cardiovascular;  Laterality: N/A;  sign heart cath consent w/tee consent  . Coronary artery bypass graft N/A 02/20/2013     Procedure: CORONARY ARTERY BYPASS GRAFTING (CABG);  Surgeon: Ivin Poot, MD;  Location: [place];  Service: Open Heart Surgery;  Laterality: N/A;  . Aortic valve replacement N/A 02/20/2013    Procedure: AORTIC VALVE REPLACEMENT (AVR);  Surgeon: Ivin Poot, MD;  Location: [place];  Service: Open Heart Surgery;  Laterality: N/A;  . Intraoperative transesophageal echocardiogram N/A 02/20/2013    Procedure: INTRAOPERATIVE TRANSESOPHAGEAL ECHOCARDIOGRAM;  Surgeon: Ivin Poot, MD;  Location: [place];  Service: Open Heart Surgery;  Laterality: N/A;    Family History  Problem Relation Age of Onset  . Allergies Mother   . Coronary artery disease Mother   . Allergy (severe) Mother   . Allergies Father   . Stroke Father   . Heart attack Father 70  . Asthma Father   . Allergy (severe) Father   . Allergies Sister   . Allergy (severe) Sister   . Allergies Brother     x2  . Allergy (severe) Brother   . Diabetes Brother   . Allergy (severe) Brother   . COPD Neg Hx   . Cancer Son     Social History History  Substance Use Topics  . Smoking status: Former Smoker -- 3.00 packs/day    Types: Cigarettes    Quit date:  02/19/1961  . Smokeless tobacco: Never Used     Comment: started at age 75  . Alcohol Use: No    Current Outpatient Prescriptions  Medication Sig Dispense Refill  . aspirin EC 81 MG tablet Take 81 mg by mouth daily.      . budesonide-formoterol (SYMBICORT) 160-4.5 MCG/ACT inhaler Inhale 2 puffs into the lungs 2 (two) times daily.  1 Inhaler  12  . busPIRone (BUSPAR) 15 MG tablet Take 7.5 mg by mouth 2 (two) times daily.      Marland Kitchen diltiazem (CARDIZEM CD) 240 MG 24 hr capsule Take 1 capsule (240 mg total) by mouth daily.  30 capsule  3  . diphenhydrAMINE (BENADRYL) 25 mg capsule Take 25 mg by mouth every 4 (four) hours as needed for allergies.      . feeding supplement, ENSURE COMPLETE, (ENSURE COMPLETE) LIQD Take 237 mLs by mouth 2 (two) times daily between meals.  60 Bottle  0   . ferrous sulfate 325 (65 FE) MG tablet Take 1 tablet (325 mg total) by mouth daily with breakfast.  30 tablet  0  . fluconazole (DIFLUCAN) 50 MG tablet Take 1 tablet (50 mg total) by mouth daily.  5 tablet  0  . hydrALAZINE (APRESOLINE) 10 MG tablet Take 1 tablet (10 mg total) by mouth 3 (three) times daily.  30 tablet  3  . ketotifen (ZADITOR) 0.025 % ophthalmic solution Place 1 drop into both eyes daily.       . Lidocaine HCl 4 % SOLN Apply 4 mLs topically as needed (for migraines).       . metoprolol tartrate (LOPRESSOR) 25 MG tablet Take 1 tablet (25 mg total) by mouth 2 (two) times daily.  60 tablet  3  . nitroGLYCERIN (NITROSTAT) 0.4 MG SL tablet Place 0.4 mg under the tongue every 5 (five) minutes as needed for chest pain.      Marland Kitchen nystatin (MYCOSTATIN) 100000 UNIT/ML suspension Take 5 mLs (500,000 Units total) by mouth 4 (four) times daily.  60 mL  0  . polyethylene glycol (MIRALAX / GLYCOLAX) packet Take 17 g by mouth daily.  14 each  0  . pravastatin (PRAVACHOL) 40 MG tablet Take 40 mg by mouth daily.      . ranitidine (ZANTAC) 150 MG tablet Take 150 mg by mouth 2 (two) times daily.      . SF 5000 PLUS 1.1 % CREA dental cream Place 1 application onto teeth at bedtime. As directed      . topiramate (TOPAMAX) 25 MG tablet Take 75 mg by mouth daily.      . traMADol (ULTRAM) 50 MG tablet Take 1-2 tablets (50-100 mg total) by mouth every 6 (six) hours as needed for moderate pain.  30 tablet  0  . warfarin (COUMADIN) 1 MG tablet Take 2 tablets (2 mg total) by mouth daily.  30 tablet  0   No current facility-administered medications for this visit.    Allergies  Allergen Reactions  . Ivp Dye [Iodinated Diagnostic Agents] Hives    Review of Systems he remains on multiple medications as listed. Chest x-ray today shows COPD no active infiltrate or edema or effusion  BP 110/60  Pulse 66  Resp 20  Ht 6\' 1"  (1.854 m)  Wt 147 lb (66.679 kg)  BMI 19.40 kg/m2  SpO2 97% Physical Exam   alert and comfortable appears to be feeling much better   lungs are clear Cardiac rhythm is regular without murmur of AI  Sternum is well-healed Abdomen is soft nontender Extremities are without edema or tenderness  Chest x-rays clear with COPD    Impression- now doing very well at home. Not ready to start driving. He is ready to start outpatient cardiac rehabilitation which should really accelerate his recovery  -   will wait for home PT to finish up home visits return for reassessment in 4 weeks   Plan:

## 2013-04-02 ENCOUNTER — Telehealth: Payer: Self-pay

## 2013-04-02 NOTE — Telephone Encounter (Signed)
Mrs Carsten called stating that her husband is not taking the RX hydralazine 10 mg tid. She mentioned yesterday at his appt. that he was taking it, but he is not. She states that his BP's have been normal. I recommended  to call his cardiologist- Dr Harrington Challenger about BP med's. She said she would call his office to make an appointment.

## 2013-04-06 ENCOUNTER — Other Ambulatory Visit: Payer: Self-pay | Admitting: Internal Medicine

## 2013-04-13 ENCOUNTER — Ambulatory Visit (INDEPENDENT_AMBULATORY_CARE_PROVIDER_SITE_OTHER): Payer: Medicare Other | Admitting: Pharmacist

## 2013-04-13 DIAGNOSIS — Z5181 Encounter for therapeutic drug level monitoring: Secondary | ICD-10-CM

## 2013-04-13 DIAGNOSIS — Z7901 Long term (current) use of anticoagulants: Secondary | ICD-10-CM

## 2013-04-13 DIAGNOSIS — I749 Embolism and thrombosis of unspecified artery: Secondary | ICD-10-CM

## 2013-04-13 LAB — POCT INR: INR: 3

## 2013-04-20 ENCOUNTER — Encounter: Payer: Self-pay | Admitting: Nurse Practitioner

## 2013-04-20 ENCOUNTER — Telehealth (HOSPITAL_COMMUNITY): Payer: Self-pay | Admitting: *Deleted

## 2013-04-20 ENCOUNTER — Ambulatory Visit (INDEPENDENT_AMBULATORY_CARE_PROVIDER_SITE_OTHER): Payer: Medicare Other | Admitting: Nurse Practitioner

## 2013-04-20 VITALS — BP 110/60 | HR 61 | Ht 73.0 in | Wt 146.8 lb

## 2013-04-20 DIAGNOSIS — E785 Hyperlipidemia, unspecified: Secondary | ICD-10-CM

## 2013-04-20 DIAGNOSIS — Z951 Presence of aortocoronary bypass graft: Secondary | ICD-10-CM

## 2013-04-20 LAB — BASIC METABOLIC PANEL
BUN: 22 mg/dL (ref 6–23)
CO2: 19 mEq/L (ref 19–32)
Calcium: 9.6 mg/dL (ref 8.4–10.5)
Chloride: 111 mEq/L (ref 96–112)
Creatinine, Ser: 1.1 mg/dL (ref 0.4–1.5)
GFR: 66.9 mL/min (ref >60.00)
Glucose, Bld: 89 mg/dL (ref 70–99)
Potassium: 3.7 mEq/L (ref 3.5–5.1)
Sodium: 140 mEq/L (ref 135–145)

## 2013-04-20 LAB — CBC
HCT: 36.8 % — ABNORMAL LOW (ref 39.0–52.0)
Hemoglobin: 11.9 g/dL — ABNORMAL LOW (ref 13.0–17.0)
MCHC: 32.3 g/dL (ref 30.0–36.0)
MCV: 88.7 fl (ref 78.0–100.0)
Platelets: 208 10*3/uL (ref 150.0–400.0)
RBC: 4.15 Mil/uL — ABNORMAL LOW (ref 4.22–5.81)
RDW: 15.4 % — ABNORMAL HIGH (ref 11.5–14.6)
WBC: 9.1 10*3/uL (ref 4.5–10.5)

## 2013-04-20 LAB — HEPATIC FUNCTION PANEL
ALT: 28 U/L (ref 0–53)
AST: 25 U/L (ref 0–37)
Albumin: 3.9 g/dL (ref 3.5–5.2)
Alkaline Phosphatase: 105 U/L (ref 39–117)
Bilirubin, Direct: 0 mg/dL (ref 0.0–0.3)
Total Bilirubin: 0.5 mg/dL (ref 0.3–1.2)
Total Protein: 8.6 g/dL — ABNORMAL HIGH (ref 6.0–8.3)

## 2013-04-20 LAB — LIPID PANEL
Cholesterol: 153 mg/dL (ref 0–200)
HDL: 35 mg/dL — ABNORMAL LOW (ref >39.00)
LDL Cholesterol: 94 mg/dL (ref 0–99)
Total CHOL/HDL Ratio: 4
Triglycerides: 122 mg/dL (ref 0.0–149.0)
VLDL: 24.4 mg/dL (ref 0.0–40.0)

## 2013-04-20 NOTE — Progress Notes (Signed)
Robert Richard Date of Birth: 25-Jan-1931 Medical Record #[id number]  History of Present Illness: Robert Richard is seen back today for a post hospital visit. Seen for Robert Richard. He has multiple medical issues. He was most recently readmitted with suspected HCAP and UTI with fever, and leukocytosis following a lengthy hospital course for CABG x 3 with LIMA to LAD, SVG to OM1, SVG to PD and AVR with a #63mm [facility] Ease pericardial valve - complicated by underlying COPD, hospital acquired influenza pneumonitis, anemia, urinary incontinence, weakness and deconditioning, mild renal insufficiency, atrial fibrillation. Switched from Xarelto to Coumadin given his AVR during that admision.     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  Comes in today. Here with his wife Robert Richard. Has seen Robert Richard last month - noted to be improving. He says he is doing ok. Taste is still an issue. Breathing ok. No chest pain. No fever or chills. Not on his Hydralazine - wife is pretty sure. BP has been great. He is not dizzy or lightheaded. Trying to increase his activity level. Home health has finished with him as of last week. He is not sure about going to cardiac rehab.   Current Outpatient Prescriptions  Medication Sig Dispense Refill  . acidophilus (RISAQUAD) CAPS capsule Take 1 capsule by mouth daily.      Marland Kitchen aspirin EC 81 MG tablet Take 81 mg by mouth daily.      . budesonide-formoterol (SYMBICORT) 160-4.5 MCG/ACT inhaler Inhale 2 puffs into the lungs 2 (two) times daily.  1 Inhaler  12  . busPIRone (BUSPAR) 15 MG tablet Take 7.5 mg by mouth 2 (two) times daily.      Marland Kitchen diltiazem (CARDIZEM CD) 240 MG 24 hr capsule Take 1 capsule (240 mg total) by mouth daily.  30 capsule  3  . diphenhydrAMINE (BENADRYL) 25 mg capsule Take 25 mg by mouth daily as needed for allergies.       . feeding  supplement, ENSURE COMPLETE, (ENSURE COMPLETE) LIQD Take 237 mLs by mouth 2 (two) times daily between meals.  60 Bottle  0  . ferrous sulfate 325 (65 FE) MG tablet Take 1 tablet (325 mg total) by mouth daily with breakfast.  30 tablet  0  . ketotifen (ZADITOR) 0.025 % ophthalmic solution Place 1 drop into both eyes daily.       . Lidocaine HCl 4 % SOLN Apply 4 mLs topically as needed (for migraines).       . metoprolol tartrate (LOPRESSOR) 25 MG tablet Take 1 tablet (25 mg total) by mouth 2 (two) times daily.  60 tablet  3  . nitroGLYCERIN (NITROSTAT) 0.4 MG SL tablet Place 0.4 mg under the tongue every 5 (five) minutes as needed for chest pain.      . polyethylene glycol (MIRALAX / GLYCOLAX) packet Take 17 g by mouth daily.  14 each  0  . pravastatin (PRAVACHOL) 40 MG tablet Take 40 mg by mouth daily.      . ranitidine (ZANTAC) 150 MG tablet Take 150 mg by mouth 2 (two) times daily.      . SF 5000 PLUS 1.1 % CREA dental cream Place 1 application onto teeth at bedtime. As directed      . topiramate (TOPAMAX) 25 MG tablet Take 75 mg by mouth daily.      . traMADol (  ULTRAM) 50 MG tablet Take 1-2 tablets (50-100 mg total) by mouth every 6 (six) hours as needed for moderate pain.  30 tablet  0  . warfarin (COUMADIN) 1 MG tablet TAKE TWO (2) TABLETS BY MOUTH DAILY  60 tablet  2   No current facility-administered medications for this visit.    Allergies  Allergen Reactions  . Ivp Dye [Iodinated Diagnostic Agents] Hives    Past Medical History  Diagnosis Date  . Anemia   . Colon polyp   . COPD (chronic obstructive pulmonary disease)     Astmatic bronchitis  . Asthma with bronchitis   . Gout     PMH  . Osteoporosis   . Allergic rhinitis   . Skin cancer, basal cell     Dr Nevada Crane, PMH  . Nephrolithiasis   . HLD (hyperlipidemia)   . Skin cancer (melanoma) 2012    Right neck  . Arthritis     SHOULDER  . Headache(784.0)   . RLS (restless legs syndrome)   . Dysrhythmia 08/2012    NEW  ONSET ATRIAL FIBRILATION    Past Surgical History  Procedure Laterality Date  . Finger surgery      for foreign body  . Colonoscopy w/ polypectomy      Dr Deatra Ina  . Inguinal hernia repair    . Nose surgery      Septal Deviation  . Neck surgery  12/2010    Melanoma ; [facility]   . Leg surgery  06/2010     RobertLawson, for blood clott. Left leg   . Tee without cardioversion N/A 01/23/2013    Procedure: TRANSESOPHAGEAL ECHOCARDIOGRAM (TEE);  Surgeon: Jolaine Artist, MD;  Location: [facility] ENDOSCOPY;  Service: Cardiovascular;  Laterality: N/A;  sign heart cath consent w/tee consent  . Coronary artery bypass graft N/A 02/20/2013    Procedure: CORONARY ARTERY BYPASS GRAFTING (CABG);  Surgeon: Ivin Poot, MD;  Location: [place];  Service: Open Heart Surgery;  Laterality: N/A;  . Aortic valve replacement N/A 02/20/2013    Procedure: AORTIC VALVE REPLACEMENT (AVR);  Surgeon: Ivin Poot, MD;  Location: [place];  Service: Open Heart Surgery;  Laterality: N/A;  . Intraoperative transesophageal echocardiogram N/A 02/20/2013    Procedure: INTRAOPERATIVE TRANSESOPHAGEAL ECHOCARDIOGRAM;  Surgeon: Ivin Poot, MD;  Location: [place];  Service: Open Heart Surgery;  Laterality: N/A;    History  Smoking status  . Former Smoker -- 3.00 packs/day  . Types: Cigarettes  . Quit date: 02/19/1961  Smokeless tobacco  . Never Used    Comment: started at age 44    History  Alcohol Use No    Family History  Problem Relation Age of Onset  . Allergies Mother   . Coronary artery disease Mother   . Allergy (severe) Mother   . Allergies Father   . Stroke Father   . Heart attack Father 35  . Asthma Father   . Allergy (severe) Father   . Allergies Sister   . Allergy (severe) Sister   . Allergies Brother     x2  . Allergy (severe) Brother   . Diabetes Brother   . Allergy (severe) Brother   . COPD Neg Hx   . Cancer Son     Review of Systems: The review of systems is per the HPI.  All other  systems were reviewed and are negative.  Physical Exam: BP 110/60  Pulse 61  Ht 6\' 1"  (1.854 m)  Wt 146 lb 12.8  oz (66.588 kg)  BMI 19.37 kg/m2  SpO2 99% Patient is very pleasant and in no acute distress. Looks a little frail and thin. Walking with a cane. Skin is warm and dry. Color is normal.  HEENT is unremarkable. Normocephalic/atraumatic. PERRL. Sclera are nonicteric. Neck is supple. No masses. No JVD. Lungs are coarse. Cardiac exam shows a regular rate and rhythm. Valve sounds ok. Abdomen is soft. Extremities are without edema. Gait and ROM are intact. No gross neurologic deficits noted.  LABORATORY DATA: PENDING  Lab Results  Component Value Date   WBC 11.4* 03/23/2013   HGB 10.9* 03/23/2013   HCT 33.6* 03/23/2013   PLT 312.0 03/23/2013   GLUCOSE 84 03/23/2013   CHOL 130 01/11/2012   TRIG 67.0 01/11/2012   HDL 44.60 01/11/2012   LDLCALC 72 01/11/2012   ALT 36 03/14/2013   AST 24 03/14/2013   NA 138 03/23/2013   K 4.5 03/23/2013   CL 109 03/23/2013   CREATININE 1.2 03/23/2013   BUN 23 03/23/2013   CO2 22 03/23/2013   TSH 2.547 09/15/2012   INR 3.0 04/13/2013   HGBA1C 6.4* 03/15/2013    Lab Results  Component Value Date   INR 3.0 04/13/2013   INR 2.7 04/01/2013   INR 3.3 03/25/2013    CXR IMPRESSION: Borderline enlargement of cardiac silhouette post CABG and AVR.  COPD changes with small subpulmonic left pleural effusion and improved left lower lobe atelectasis versus consolidation.   Electronically Signed By: Lavonia Dana M.D. On: 04/01/2013 13:12  Echo Study Conclusions  - Left ventricle: The cavity size was normal. Wall thickness was increased in a pattern of mild LVH. Systolic function was vigorous. The estimated ejection fraction was in the range of 65% to 70%. - Aortic valve: AV prosthesis is diffilcult to see. Peak and mean gradients through the prosthesis are 31 and 18 mm Hg respectively. Valve area: 1.42cm^2(VTI). Valve area: 1.27cm^2 (Vmax). - Mitral valve: Calcified  annulus. Moderately thickened, moderately calcified leaflets posterior. Valve area by continuity equation (using LVOT flow): 1.18cm^2. - Left atrium: The atrium was severely dilated. - Right atrium: The atrium was mildly dilated.    Assessment / Plan: 1. Post CABG/AVR - complicated by AF and generalized deconditioning - he is doing well and making steady progress. Will keep him on his current regimen. I have encouraged him to consider cardiac rehab to help continue with his progress.  2. HTN - BP looks ok. He is not on his Hydralazine and I do not think he needs this at time. Wife will monitor at home.   3. HLD - on statin therapy - recheck his labs today.   4. PAF - appears to be in sinus on exam today - will check EKG today  5. Chronic anticoagulation with coumadin - no problems noted. This will be long term for him.   6. Recent UTI associated with metabolic encephalopathy and suspected HCAP - resolved.   See him back in 6 weeks. He has already had an echo post surgery. Check his labs today. Overall he looks good.   Patient is agreeable to this plan and will call if any problems develop in the interim.   Burtis Junes, RN, [place] [address] [place], [place]  [phone]

## 2013-04-20 NOTE — Patient Instructions (Signed)
Stay on your current medicines  Check some blood pressures at home - keep a diary  I will send a note to cardiac rehab  We will check labs and an EKG today  See Dr. Harrington Challenger in 6 weeks  Call the [place] office at [phone] if you have any questions, problems or concerns.

## 2013-04-27 ENCOUNTER — Ambulatory Visit (INDEPENDENT_AMBULATORY_CARE_PROVIDER_SITE_OTHER): Payer: Medicare Other

## 2013-04-27 DIAGNOSIS — Z7901 Long term (current) use of anticoagulants: Secondary | ICD-10-CM

## 2013-04-27 DIAGNOSIS — I749 Embolism and thrombosis of unspecified artery: Secondary | ICD-10-CM

## 2013-04-27 DIAGNOSIS — Z5181 Encounter for therapeutic drug level monitoring: Secondary | ICD-10-CM

## 2013-04-27 LAB — POCT INR: INR: 1.7

## 2013-04-29 ENCOUNTER — Ambulatory Visit (INDEPENDENT_AMBULATORY_CARE_PROVIDER_SITE_OTHER): Payer: Self-pay | Admitting: Cardiothoracic Surgery

## 2013-04-29 ENCOUNTER — Encounter: Payer: Self-pay | Admitting: Cardiothoracic Surgery

## 2013-04-29 VITALS — BP 134/79 | HR 72 | Resp 20 | Ht 73.0 in | Wt 146.0 lb

## 2013-04-29 DIAGNOSIS — I359 Nonrheumatic aortic valve disorder, unspecified: Secondary | ICD-10-CM

## 2013-04-29 DIAGNOSIS — I2510 Atherosclerotic heart disease of native coronary artery without angina pectoris: Secondary | ICD-10-CM

## 2013-04-29 DIAGNOSIS — J449 Chronic obstructive pulmonary disease, unspecified: Secondary | ICD-10-CM

## 2013-04-29 DIAGNOSIS — Z954 Presence of other heart-valve replacement: Secondary | ICD-10-CM

## 2013-04-29 DIAGNOSIS — Z951 Presence of aortocoronary bypass graft: Secondary | ICD-10-CM

## 2013-04-29 DIAGNOSIS — I350 Nonrheumatic aortic (valve) stenosis: Secondary | ICD-10-CM

## 2013-04-29 DIAGNOSIS — Z952 Presence of prosthetic heart valve: Secondary | ICD-10-CM

## 2013-04-29 DIAGNOSIS — J4489 Other specified chronic obstructive pulmonary disease: Secondary | ICD-10-CM

## 2013-04-29 NOTE — Progress Notes (Signed)
PCP is Unice Cobble, MD Referring Provider is Sherren Mocha, MD  Chief Complaint  Patient presents with  . Routine Post Op    4 week f/u    HPI: 80 year old patient with COPD returns for 3 month followup after aVR-CABG using a biologic valve. He developed influenza pneumonitis postop prolonged his hospitalization. He also had problems of urinary retention and incontinence, and difficulty swallowing. After his discharge he developed a Enterobacter UTI and required readmission. He has now been at home for several weeks and is steadily improving. His walking daily and using a treadmill. His appetite is improved. His swallowing is back to normal and he is sleeping better. He does not wish to start hospital cardiac rehabilitation. The patient is been on anticoagulation for months (history of A. fib with arterial embolus) and was taking Xarelto before surgery but was switched to Coumadin for safety after surgery. He now could resume the Xarelto if his cardiologist, Dr. Harrington Challenger agrees.  Past Medical History  Diagnosis Date  . Anemia   . Colon polyp   . COPD (chronic obstructive pulmonary disease)     Astmatic bronchitis  . Asthma with bronchitis   . Gout     PMH  . Osteoporosis   . Allergic rhinitis   . Skin cancer, basal cell     Dr Nevada Crane, PMH  . Nephrolithiasis   . HLD (hyperlipidemia)   . Skin cancer (melanoma) 2012    Right neck  . Arthritis     SHOULDER  . Headache(784.0)   . RLS (restless legs syndrome)   . Dysrhythmia 08/2012    NEW ONSET ATRIAL FIBRILATION    Past Surgical History  Procedure Laterality Date  . Finger surgery      for foreign body  . Colonoscopy w/ polypectomy      Dr Deatra Ina  . Inguinal hernia repair    . Nose surgery      Septal Deviation  . Neck surgery  12/2010    Melanoma ; [facility]   . Leg surgery  06/2010     Dr.Lawson, for blood clott. Left leg   . Tee without cardioversion N/A 01/23/2013    Procedure: TRANSESOPHAGEAL ECHOCARDIOGRAM  (TEE);  Surgeon: Jolaine Artist, MD;  Location: [facility] ENDOSCOPY;  Service: Cardiovascular;  Laterality: N/A;  sign heart cath consent w/tee consent  . Coronary artery bypass graft N/A 02/20/2013    Procedure: CORONARY ARTERY BYPASS GRAFTING (CABG);  Surgeon: Ivin Poot, MD;  Location: [place];  Service: Open Heart Surgery;  Laterality: N/A;  . Aortic valve replacement N/A 02/20/2013    Procedure: AORTIC VALVE REPLACEMENT (AVR);  Surgeon: Ivin Poot, MD;  Location: [place];  Service: Open Heart Surgery;  Laterality: N/A;  . Intraoperative transesophageal echocardiogram N/A 02/20/2013    Procedure: INTRAOPERATIVE TRANSESOPHAGEAL ECHOCARDIOGRAM;  Surgeon: Ivin Poot, MD;  Location: [place];  Service: Open Heart Surgery;  Laterality: N/A;    Family History  Problem Relation Age of Onset  . Allergies Mother   . Coronary artery disease Mother   . Allergy (severe) Mother   . Allergies Father   . Stroke Father   . Heart attack Father 45  . Asthma Father   . Allergy (severe) Father   . Allergies Sister   . Allergy (severe) Sister   . Allergies Brother     x2  . Allergy (severe) Brother   . Diabetes Brother   . Allergy (severe) Brother   . COPD Neg Hx   .  Cancer Son     Social History History  Substance Use Topics  . Smoking status: Former Smoker -- 3.00 packs/day    Types: Cigarettes    Quit date: 02/19/1961  . Smokeless tobacco: Never Used     Comment: started at age 61  . Alcohol Use: No    Current Outpatient Prescriptions  Medication Sig Dispense Refill  . acidophilus (RISAQUAD) CAPS capsule Take 1 capsule by mouth daily.      Marland Kitchen aspirin EC 81 MG tablet Take 81 mg by mouth daily.      . budesonide-formoterol (SYMBICORT) 160-4.5 MCG/ACT inhaler Inhale 2 puffs into the lungs 2 (two) times daily.  1 Inhaler  12  . busPIRone (BUSPAR) 15 MG tablet Take 7.5 mg by mouth 2 (two) times daily.      Marland Kitchen diltiazem (CARDIZEM CD) 240 MG 24 hr capsule Take 1 capsule (240 mg total) by mouth  daily.  30 capsule  3  . diphenhydrAMINE (BENADRYL) 25 mg capsule Take 25 mg by mouth daily as needed for allergies.       . feeding supplement, ENSURE COMPLETE, (ENSURE COMPLETE) LIQD Take 237 mLs by mouth 2 (two) times daily between meals.  60 Bottle  0  . ferrous sulfate 325 (65 FE) MG tablet Take 1 tablet (325 mg total) by mouth daily with breakfast.  30 tablet  0  . ketotifen (ZADITOR) 0.025 % ophthalmic solution Place 1 drop into both eyes daily.       . Lidocaine HCl 4 % SOLN Apply 4 mLs topically as needed (for migraines).       . metoprolol tartrate (LOPRESSOR) 25 MG tablet Take 1 tablet (25 mg total) by mouth 2 (two) times daily.  60 tablet  3  . nitroGLYCERIN (NITROSTAT) 0.4 MG SL tablet Place 0.4 mg under the tongue every 5 (five) minutes as needed for chest pain.      . polyethylene glycol (MIRALAX / GLYCOLAX) packet Take 17 g by mouth daily.  14 each  0  . pravastatin (PRAVACHOL) 40 MG tablet Take 40 mg by mouth daily.      . ranitidine (ZANTAC) 150 MG tablet Take 150 mg by mouth 2 (two) times daily.      . SF 5000 PLUS 1.1 % CREA dental cream Place 1 application onto teeth at bedtime. As directed      . topiramate (TOPAMAX) 25 MG tablet Take 75 mg by mouth daily.      . traMADol (ULTRAM) 50 MG tablet Take 1-2 tablets (50-100 mg total) by mouth every 6 (six) hours as needed for moderate pain.  30 tablet  0  . warfarin (COUMADIN) 1 MG tablet TAKE TWO (2) TABLETS BY MOUTH DAILY  60 tablet  2   No current facility-administered medications for this visit.    Allergies  Allergen Reactions  . Ivp Dye [Iodinated Diagnostic Agents] Hives    Review of Systems much improved in all regards  BP 134/79  Pulse 72  Resp 20  Ht 6\' 1"  (1.854 m)  Wt 146 lb (66.225 kg)  BMI 19.27 kg/m2  SpO2 97% Physical Exam Alert and comfortable Lungs clear Sternal incision well-healed Heart rhythm regular No murmur or gallop No pedal edema  Diagnostic Tests: No chest x-ray  today Echocardiogram performed last month postop shows EF 65%, no AI  Impression: Good recovery now approximately 3 months after CABG and aVR He'll return to the care of his cardiologist Dr. Harrington Challenger He can be converted back  to the Xarelto  from Coumadin at any time for chronic anticoagulation  Plan: Return when necessary

## 2013-05-07 ENCOUNTER — Ambulatory Visit (INDEPENDENT_AMBULATORY_CARE_PROVIDER_SITE_OTHER): Payer: Medicare Other | Admitting: Pharmacist

## 2013-05-07 ENCOUNTER — Other Ambulatory Visit: Payer: Self-pay | Admitting: Internal Medicine

## 2013-05-07 DIAGNOSIS — Z5181 Encounter for therapeutic drug level monitoring: Secondary | ICD-10-CM

## 2013-05-07 DIAGNOSIS — Z7901 Long term (current) use of anticoagulants: Secondary | ICD-10-CM

## 2013-05-07 DIAGNOSIS — I749 Embolism and thrombosis of unspecified artery: Secondary | ICD-10-CM

## 2013-05-07 LAB — POCT INR: INR: 2.1

## 2013-05-07 NOTE — Telephone Encounter (Signed)
Refill done.  

## 2013-05-08 NOTE — Telephone Encounter (Signed)
Message copied by Gilad Dugger, Shan Levans on Fri May 08, 2013 11:45 AM ------      Message from: MATHIS, DEBRA W      Created: Thu May 07, 2013  3:59 PM      Regarding: FW: patient wants to start back on xarelto                   ----- Message -----         From: Fay Records, MD         Sent: 05/07/2013   1:01 PM           To: Fredia Beets, RN      Subject: RE: patient wants to start back on xarelto               Patient can stop coumadin and resume xarelto.      ----- Message -----         From: Zenovia Jarred, RN         Sent: 05/01/2013   9:48 AM           To: Fay Records, MD      Subject: FW: patient wants to start back on xarelto                Dr Harrington Challenger, please review Dr Lawson Fiscal note from 04/29/13, he states pt can switch back to Xarelto if ok'd by you, Cardiology. Please let [facility] know. Thank you.                    ----- Message -----         From: Fernande Boyden, RN         Sent: 04/30/2013   7:57 AM           To: Fay Records, MD, #      Subject: patient wants to start back on xarelto                   Appointment with coumadin clinic 05/07/2013 and Dr Harrington Challenger in April, he's a post CABG.            Dr Nils Pyle has released him, this is his note:      Impression:      Good recovery now approximately 3 months after CABG and aVR      He'll return to the care of his cardiologist Dr. Harrington Challenger      He can be converted back to the Xarelto  from Coumadin at any time for chronic anticoagulation            Thanks, Lovett Sox, RN             ------

## 2013-05-08 NOTE — Telephone Encounter (Signed)
Spoke with spouse pt would like to return to [place]. INR checked yesterday and it was 2.1, has not taken today's coumadin, thus pt will discontinue coumadin and restart Xarelto 20mg s today with largest meal. 37mth f/U scheduled on 06/01/13 after Dr Harrington Challenger' appt. Spouse states pt has refills.

## 2013-05-31 ENCOUNTER — Other Ambulatory Visit: Payer: Self-pay | Admitting: Internal Medicine

## 2013-06-01 ENCOUNTER — Ambulatory Visit (INDEPENDENT_AMBULATORY_CARE_PROVIDER_SITE_OTHER): Payer: Medicare Other | Admitting: Internal Medicine

## 2013-06-01 ENCOUNTER — Encounter: Payer: Self-pay | Admitting: Internal Medicine

## 2013-06-01 VITALS — BP 122/64 | HR 72 | Ht 73.0 in | Wt 150.0 lb

## 2013-06-01 DIAGNOSIS — I10 Essential (primary) hypertension: Secondary | ICD-10-CM

## 2013-06-01 DIAGNOSIS — I2510 Atherosclerotic heart disease of native coronary artery without angina pectoris: Secondary | ICD-10-CM

## 2013-06-01 LAB — CBC
HCT: 33.2 % — ABNORMAL LOW (ref 39.0–52.0)
Hemoglobin: 11 g/dL — ABNORMAL LOW (ref 13.0–17.0)
MCHC: 33.1 g/dL (ref 30.0–36.0)
MCV: 85.6 fl (ref 78.0–100.0)
Platelets: 216 10*3/uL (ref 150.0–400.0)
RBC: 3.87 Mil/uL — ABNORMAL LOW (ref 4.22–5.81)
RDW: 14.8 % — ABNORMAL HIGH (ref 11.5–14.6)
WBC: 9.1 10*3/uL (ref 4.5–10.5)

## 2013-06-01 NOTE — Patient Instructions (Signed)
Your physician recommends that you continue on your current medications as directed. Please refer to the Current Medication list given to you today. Your physician recommends that you schedule a follow-up appointment at the end of August, 2015 with Dr. Harrington Challenger.  Your physician recommends that you return for lab work in: TODAY (CBC)

## 2013-06-01 NOTE — Progress Notes (Addendum)
Robert Richard Date of Birth: Jan 14, 1931 Medical Record #[id number]  History of Present Illness:  Patinet is an 80 yo who is s/p CABG x 3 (LIMA to LAD; SVG to OM1; SVG to PDA) and AVR (#23 mm [facility] Ease pericardial valve.  Post op course long/complicated.  Patient last seen in clinic by L Gerhardt.  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 Since seen he has done OK  Breathing is OK  No CP  Slowly regaining strength but still weak in legs   Current Outpatient Prescriptions  Medication Sig Dispense Refill  . acidophilus (RISAQUAD) CAPS capsule Take 1 capsule by mouth daily.      Marland Kitchen aspirin EC 81 MG tablet Take 81 mg by mouth daily.      . budesonide-formoterol (SYMBICORT) 160-4.5 MCG/ACT inhaler Inhale 2 puffs into the lungs 2 (two) times daily.  1 Inhaler  12  . busPIRone (BUSPAR) 15 MG tablet Take 7.5 mg by mouth 2 (two) times daily.      . busPIRone (BUSPAR) 15 MG tablet TAKE ONE-HALF TABLET BY MOUTH TWICE A DAY  90 tablet  0  . diltiazem (CARDIZEM CD) 240 MG 24 hr capsule Take 1 capsule (240 mg total) by mouth daily.  30 capsule  3  . diphenhydrAMINE (BENADRYL) 25 mg capsule Take 25 mg by mouth daily as needed for allergies.       . feeding supplement, ENSURE COMPLETE, (ENSURE COMPLETE) LIQD Take 237 mLs by mouth 2 (two) times daily between meals.  60 Bottle  0  . ferrous sulfate 325 (65 FE) MG tablet Take 1 tablet (325 mg total) by mouth daily with breakfast.  30 tablet  0  . ketotifen (ZADITOR) 0.025 % ophthalmic solution Place 1 drop into both eyes daily.       . Lidocaine HCl 4 % SOLN Apply 4 mLs topically as needed (for migraines).       . metoprolol tartrate (LOPRESSOR) 25 MG tablet Take 1 tablet (25 mg total) by mouth 2 (two) times daily.  60 tablet  3  . nitroGLYCERIN (NITROSTAT) 0.4 MG SL tablet Place 0.4 mg under the tongue every 5 (five)  minutes as needed for chest pain.      . polyethylene glycol (MIRALAX / GLYCOLAX) packet Take 17 g by mouth daily.  14 each  0  . pravastatin (PRAVACHOL) 40 MG tablet Take 40 mg by mouth daily.      . pravastatin (PRAVACHOL) 40 MG tablet TAKE ONE (1) TABLET BY MOUTH EVERY DAY  90 tablet  0  . ranitidine (ZANTAC) 150 MG tablet Take 150 mg by mouth 2 (two) times daily.      . ranitidine (ZANTAC) 150 MG tablet TAKE ONE TABLET TWICE DAILY  180 tablet  0  . SF 5000 PLUS 1.1 % CREA dental cream Place 1 application onto teeth at bedtime. As directed      . topiramate (TOPAMAX) 25 MG tablet Take 75 mg by mouth daily.      . traMADol (ULTRAM) 50 MG tablet Take 1-2 tablets (50-100 mg total) by mouth every 6 (six) hours as needed for moderate pain.  30 tablet  0  . warfarin (COUMADIN) 1 MG tablet TAKE TWO (2) TABLETS BY MOUTH DAILY  60 tablet  2   No current facility-administered medications for this visit.    Allergies  Allergen Reactions  . Ivp Dye [Iodinated Diagnostic Agents] Hives    Past Medical History  Diagnosis Date  . Anemia   . Colon polyp   . COPD (chronic obstructive pulmonary disease)     Astmatic bronchitis  . Asthma with bronchitis   . Gout     PMH  . Osteoporosis   . Allergic rhinitis   . Skin cancer, basal cell     Dr Nevada Crane, PMH  . Nephrolithiasis   . HLD (hyperlipidemia)   . Skin cancer (melanoma) 2012    Right neck  . Arthritis     SHOULDER  . Headache(784.0)   . RLS (restless legs syndrome)   . Dysrhythmia 08/2012    NEW ONSET ATRIAL FIBRILATION    Past Surgical History  Procedure Laterality Date  . Finger surgery      for foreign body  . Colonoscopy w/ polypectomy      Dr Deatra Ina  . Inguinal hernia repair    . Nose surgery      Septal Deviation  . Neck surgery  12/2010    Melanoma ; [facility]   . Leg surgery  06/2010     Dr.Lawson, for blood clott. Left leg   . Tee without cardioversion N/A 01/23/2013    Procedure: TRANSESOPHAGEAL ECHOCARDIOGRAM  (TEE);  Surgeon: Jolaine Artist, MD;  Location: [facility] ENDOSCOPY;  Service: Cardiovascular;  Laterality: N/A;  sign heart cath consent w/tee consent  . Coronary artery bypass graft N/A 02/20/2013    Procedure: CORONARY ARTERY BYPASS GRAFTING (CABG);  Surgeon: Ivin Poot, MD;  Location: [place];  Service: Open Heart Surgery;  Laterality: N/A;  . Aortic valve replacement N/A 02/20/2013    Procedure: AORTIC VALVE REPLACEMENT (AVR);  Surgeon: Ivin Poot, MD;  Location: [place];  Service: Open Heart Surgery;  Laterality: N/A;  . Intraoperative transesophageal echocardiogram N/A 02/20/2013    Procedure: INTRAOPERATIVE TRANSESOPHAGEAL ECHOCARDIOGRAM;  Surgeon: Ivin Poot, MD;  Location: [place];  Service: Open Heart Surgery;  Laterality: N/A;    History  Smoking status  . Former Smoker -- 3.00 packs/day  . Types: Cigarettes  . Quit date: 02/19/1961  Smokeless tobacco  . Never Used    Comment: started at age 23    History  Alcohol Use No    Family History  Problem Relation Age of Onset  . Allergies Mother   . Coronary artery disease Mother   . Allergy (severe) Mother   . Allergies Father   . Stroke Father   . Heart attack Father 76  . Asthma Father   . Allergy (severe) Father   . Allergies Sister   . Allergy (severe) Sister   . Allergies Brother     x2  . Allergy (severe) Brother   . Diabetes Brother   . Allergy (severe) Brother   . COPD Neg Hx   . Cancer Son     Review of Systems: The review of systems is per the HPI.  All other systems were reviewed and are negative.  Physical Exam: There were no vitals taken for this visit. Patient is very pleasant and in no acute distress. Looks a little frail and thin.  HEENT is unremarkable. Normocephalic/atraumatic. PERRL. Sclera are nonicteric.  Neck is supple. No masses. No JVD.  Lungs are CTA . Cardiac exam shows a regular rate and rhythm  No mumurs  Abdomen is soft. Extremities are without edema.   LABORATORY DATA:  PENDING  Lab Results  Component Value  Date   WBC 9.1 04/20/2013   HGB 11.9* 04/20/2013   HCT 36.8* 04/20/2013   PLT 208.0 04/20/2013   GLUCOSE 89 04/20/2013   CHOL 153 04/20/2013   TRIG 122.0 04/20/2013   HDL 35.00* 04/20/2013   LDLCALC 94 04/20/2013   ALT 28 04/20/2013   AST 25 04/20/2013   NA 140 04/20/2013   K 3.7 04/20/2013   CL 111 04/20/2013   CREATININE 1.1 04/20/2013   BUN 22 04/20/2013   CO2 19 04/20/2013   TSH 2.547 09/15/2012   INR 2.1 05/07/2013   HGBA1C 6.4* 03/15/2013    Lab Results  Component Value Date   INR 2.1 05/07/2013   INR 1.7 04/27/2013   INR 3.0 04/13/2013    CXR IMPRESSION: Borderline enlargement of cardiac silhouette post CABG and AVR.  COPD changes with small subpulmonic left pleural effusion and improved left lower lobe atelectasis versus consolidation.   Electronically Signed By: Lavonia Dana M.D. On: 04/01/2013 13:12  Echo Study Conclusions  - Left ventricle: The cavity size was normal. Wall thickness was increased in a pattern of mild LVH. Systolic function was vigorous. The estimated ejection fraction was in the range of 65% to 70%. - Aortic valve: AV prosthesis is diffilcult to see. Peak and mean gradients through the prosthesis are 31 and 18 mm Hg respectively. Valve area: 1.42cm^2(VTI). Valve area: 1.27cm^2 (Vmax). - Mitral valve: Calcified annulus. Moderately thickened, moderately calcified leaflets posterior. Valve area by continuity equation (using LVOT flow): 1.18cm^2. - Left atrium: The atrium was severely dilated. - Right atrium: The atrium was mildly dilated.    Assessment / Plan: 1. Post CABG/AVR - complicated by AF and generalized deconditioning.  He is recovering  Still weak  Discussed exercise  Will refer to PT for strength traing.  2. HTN - BP looks ok. He is not on his Hydralazine and I do not think he needs this at time. Wife will monitor at home.   3. HLD - on statin therapy - recheck his labs in August.   4. PAF -Clinically in [place].    5.  Chronic anticoagulation with coumadin - no problems noted. This will be long term for him.

## 2013-06-08 ENCOUNTER — Other Ambulatory Visit: Payer: Self-pay | Admitting: *Deleted

## 2013-06-08 DIAGNOSIS — I10 Essential (primary) hypertension: Secondary | ICD-10-CM

## 2013-06-15 LAB — BASIC METABOLIC PANEL WITH GFR
BUN: 17 mg/dL (ref 6–23)
CO2: 22 mEq/L (ref 19–32)
Calcium: 9.6 mg/dL (ref 8.4–10.5)
Chloride: 106 mEq/L (ref 96–112)
Creat: 1.1 mg/dL (ref 0.50–1.35)
GFR, Est African American: 72 mL/min
GFR, Est Non African American: 63 mL/min
Glucose, Bld: 89 mg/dL (ref 70–99)
Potassium: 4.5 mEq/L (ref 3.5–5.3)
Sodium: 137 mEq/L (ref 135–145)

## 2013-07-06 ENCOUNTER — Other Ambulatory Visit: Payer: Self-pay | Admitting: Physician Assistant

## 2013-07-08 ENCOUNTER — Other Ambulatory Visit: Payer: Self-pay

## 2013-07-08 MED ORDER — DILTIAZEM HCL ER COATED BEADS 240 MG PO CP24: 240.0000 mg | ORAL_CAPSULE | Freq: Every day | ORAL | Status: DC

## 2013-07-08 MED ORDER — METOPROLOL TARTRATE 25 MG PO TABS: 25.0000 mg | ORAL_TABLET | Freq: Two times a day (BID) | ORAL | Status: DC

## 2013-07-09 ENCOUNTER — Other Ambulatory Visit: Payer: Self-pay

## 2013-07-09 MED ORDER — DILTIAZEM HCL ER COATED BEADS 240 MG PO CP24: 240.0000 mg | ORAL_CAPSULE | Freq: Every day | ORAL | Status: DC

## 2013-07-10 ENCOUNTER — Ambulatory Visit: Payer: Medicare Other | Admitting: Internal Medicine

## 2013-07-14 ENCOUNTER — Other Ambulatory Visit (INDEPENDENT_AMBULATORY_CARE_PROVIDER_SITE_OTHER): Payer: Medicare Other

## 2013-07-14 ENCOUNTER — Encounter: Payer: Self-pay | Admitting: Internal Medicine

## 2013-07-14 ENCOUNTER — Ambulatory Visit (INDEPENDENT_AMBULATORY_CARE_PROVIDER_SITE_OTHER)
Admission: RE | Admit: 2013-07-14 | Discharge: 2013-07-14 | Disposition: A | Discharge status: Home / Self Care | Payer: Medicare Other | Source: Ambulatory Visit | Attending: Internal Medicine | Admitting: Internal Medicine

## 2013-07-14 ENCOUNTER — Ambulatory Visit (INDEPENDENT_AMBULATORY_CARE_PROVIDER_SITE_OTHER): Payer: Medicare Other | Admitting: Internal Medicine

## 2013-07-14 VITALS — BP 116/72 | HR 89 | Temp 98.2°F | Resp 16 | Wt 143.0 lb

## 2013-07-14 DIAGNOSIS — T148 Other injury of unspecified body region: Secondary | ICD-10-CM

## 2013-07-14 DIAGNOSIS — R627 Adult failure to thrive: Secondary | ICD-10-CM

## 2013-07-14 DIAGNOSIS — R21 Rash and other nonspecific skin eruption: Secondary | ICD-10-CM

## 2013-07-14 DIAGNOSIS — R509 Fever, unspecified: Secondary | ICD-10-CM

## 2013-07-14 DIAGNOSIS — J441 Chronic obstructive pulmonary disease with (acute) exacerbation: Secondary | ICD-10-CM

## 2013-07-14 DIAGNOSIS — B370 Candidal stomatitis: Secondary | ICD-10-CM

## 2013-07-14 DIAGNOSIS — B3781 Candidal esophagitis: Secondary | ICD-10-CM

## 2013-07-14 DIAGNOSIS — W57XXXA Bitten or stung by nonvenomous insect and other nonvenomous arthropods, initial encounter: Secondary | ICD-10-CM

## 2013-07-14 LAB — CBC WITH DIFFERENTIAL/PLATELET
Basophils Absolute: 0 10*3/uL (ref 0.0–0.1)
Basophils Relative: 0.4 % (ref 0.0–3.0)
Eosinophils Absolute: 0 10*3/uL (ref 0.0–0.7)
Eosinophils Relative: 0.2 % (ref 0.0–5.0)
HCT: 33.7 % — ABNORMAL LOW (ref 39.0–52.0)
Hemoglobin: 11.3 g/dL — ABNORMAL LOW (ref 13.0–17.0)
Lymphocytes Relative: 22.6 % (ref 12.0–46.0)
Lymphs Abs: 1.6 10*3/uL (ref 0.7–4.0)
MCHC: 33.5 g/dL (ref 30.0–36.0)
MCV: 83.8 fl (ref 78.0–100.0)
Monocytes Absolute: 1.2 10*3/uL — ABNORMAL HIGH (ref 0.1–1.0)
Monocytes Relative: 16.4 % — ABNORMAL HIGH (ref 3.0–12.0)
Neutro Abs: 4.4 10*3/uL (ref 1.4–7.7)
Neutrophils Relative %: 60.4 % (ref 43.0–77.0)
Platelets: 238 10*3/uL (ref 150.0–400.0)
RBC: 4.03 Mil/uL — ABNORMAL LOW (ref 4.22–5.81)
RDW: 14 % (ref 11.5–15.5)
WBC: 7.2 10*3/uL (ref 4.0–10.5)

## 2013-07-14 LAB — BASIC METABOLIC PANEL
BUN: 25 mg/dL — ABNORMAL HIGH (ref 6–23)
CO2: 19 mEq/L (ref 19–32)
Calcium: 8.9 mg/dL (ref 8.4–10.5)
Chloride: 105 mEq/L (ref 96–112)
Creatinine, Ser: 1.4 mg/dL (ref 0.4–1.5)
GFR: 51.26 mL/min — ABNORMAL LOW (ref >60.00)
Glucose, Bld: 105 mg/dL — ABNORMAL HIGH (ref 70–99)
Potassium: 4.2 mEq/L (ref 3.5–5.1)
Sodium: 134 mEq/L — ABNORMAL LOW (ref 135–145)

## 2013-07-14 MED ORDER — NYSTATIN 100000 UNIT/ML MT SUSP: 5.0000 mL | Freq: Four times a day (QID) | OROMUCOSAL | Status: DC

## 2013-07-14 NOTE — Progress Notes (Signed)
Subjective:    Patient ID: Robert Richard, male    DOB: 1930-08-03, 80 y.o.   MRN: [id number]  HPI  He has been ill since he had pneumonia in January of this year. He describes intermittent fever over the past weekend with temperatures up to 102 this morning. Prior to this am he had not actually measured his temperature.  He's had some sore throat and rhinitis. There is mild head congestion but more significant chest congestion. Cough is productive of yellow sputum. The cough is associated with some wheezing and significant shortness of breath.  He has minor extrinsic symptoms of itchy, watery eyes, and sneezing  He has tried Mucinex and Zyrtec without benefit    Review of Systems He does have some myalgias particularly in the legs. He describes marked weakness generally. He has no appetite; this is attributed to oral thrush. He has been losing weight despite taking 2 Ensures a day. His wife asked about using Megace. He has removed a tick from the left chest approximately 6 weeks ago. He has noted an erythematous lesion on the left calf over the last 2 weeks.     Objective:   Physical Exam Significant findings include appearance of suboptimal nutrition. He appears somewhat chronically ill. He does have surprisingly good range of motion of the neck. He has severe thrush clinically. The oropharynx is markedly dry and erythematous. There is thoracic lordotic curve accentuation There is generalized limb wasting. There is slightly indurated lesion 6 x 3.5 cm of the left calf with a central eschar 2.5 x 2.5 mm. It is not hot or tender to touch.It suggests resolving ecchymotic process. Homans sign is negative. Pedal pulses are generally decreased.  Head: Normocephalic without obvious abnormalities;  no alopecia  Eyes: No corneal or conjunctival inflammation noted. Ears: External  ear exam reveals no significant lesions or deformities. Canals clear .TMs dull Nose: External nasal exam  reveals no deformity or inflammation. Nasal mucosa are pink and moist. No lesions or exudates noted.    Neck: No deformities, masses, or tenderness noted.  Thyroid normal.. Lungs: Normal respiratory effort; chest expands symmetrically but expansion decreased overall. Lungs reveal low grade scattered rhonchi w/o wheezes or rubs. BS decreased. Heart: Normal rate and rhythm. Normal S1 and S2. No gallop, click, or rub.  Abdomen: Bowel sounds normal; abdomen soft and nontender. No masses, organomegaly or hernias noted.                                Musculoskeletal/extremities:  No clubbing, cyanosis, edema, or significant extremity  deformity noted. Tone & strength decreased.   Fingernail  health good. Able to lie down & sit up w/o help. Negative SLR bilaterally Vascular: Carotid, radial artery, dorsalis pedis and  posterior tibial pulses are equal. No bruits present. Neurologic: Deep tendon reflexes symmetrical and normal.  Gait normal .      Skin: See entry above Lymph: No cervical, axillary lymphadenopathy present. Psych: Mood and affect are flat  Assessment & Plan:    #1 fever  #2 thrush, oral  #3 skin lesion in the context of history of tick bite. Presentation probably affected by Xarelto therapy.  #4 rhonchi , COPD with exacerbation  #5 adult failure to thrive  See orders

## 2013-07-14 NOTE — Progress Notes (Signed)
Pre visit review using our clinic review tool, if applicable. No additional management support is needed unless otherwise documented below in the visit note. 

## 2013-07-14 NOTE — Patient Instructions (Signed)
Your next office appointment will be determined based upon review of your pending labs & x-rays. Those instructions will be transmitted to you  by mail. Followup as needed for your acute issue. Please report any significant change in your symptoms. 

## 2013-07-14 NOTE — Progress Notes (Signed)
   Subjective:    Patient ID: DEVONN GIAMPIETRO, male    DOB: 14-Jun-1930, 80 y.o.   MRN: [id number]  HPI  Patient complains of fever this morning at 0230 that was 102 degrees and sweats during the night. His wife describes similar complaints that began over the weekend, but no temperature was obtained. Patient complains of weakness in legs that has made him unsteady walking since January's heart surgery. He denies falling.   His wife states that she pulled a tick off of his chest in late March.   He also has a indurated, erythematous, site on his left calf. It is 6 cm x 3.5 cm approximately. Denies pruritus.   Review of Systems    His first symptoms began in Jan. And were sore throat, rhinitis, head and chest congestion, cough with thin yellow sputum, itchy watery eyes, sneezing. He currently has fever, chills, sweats, sore throat with dysphagia, wheezing, SOB, and muscle/joint pain in lower extremeties. Temperature in office is 98.2. He has anorexia with weight loss, unsure of amount, but wife claims is significant.  Objective:   Physical Exam        Assessment & Plan:

## 2013-07-15 ENCOUNTER — Other Ambulatory Visit: Payer: Self-pay | Admitting: Internal Medicine

## 2013-07-15 DIAGNOSIS — J189 Pneumonia, unspecified organism: Secondary | ICD-10-CM

## 2013-07-15 LAB — ROCKY MTN SPOTTED FVR AB, IGM-BLOOD: ROCKY MTN SPOTTED FEVER, IGM: 0.28 IV

## 2013-07-15 MED ORDER — LEVOFLOXACIN 500 MG PO TABS: 500.0000 mg | ORAL_TABLET | Freq: Every day | ORAL | Status: DC

## 2013-07-22 ENCOUNTER — Ambulatory Visit (INDEPENDENT_AMBULATORY_CARE_PROVIDER_SITE_OTHER)
Admission: RE | Admit: 2013-07-22 | Discharge: 2013-07-22 | Disposition: A | Discharge status: Home / Self Care | Payer: Medicare Other | Source: Ambulatory Visit | Attending: Internal Medicine | Admitting: Internal Medicine

## 2013-07-22 DIAGNOSIS — J189 Pneumonia, unspecified organism: Secondary | ICD-10-CM

## 2013-07-23 ENCOUNTER — Other Ambulatory Visit: Payer: Self-pay | Admitting: Internal Medicine

## 2013-07-23 ENCOUNTER — Telehealth: Payer: Self-pay

## 2013-07-23 DIAGNOSIS — R918 Other nonspecific abnormal finding of lung field: Secondary | ICD-10-CM

## 2013-07-23 NOTE — Telephone Encounter (Signed)
Message copied by Shelly Coss on Thu Jul 23, 2013  9:40 AM ------      Message from: Robert Richard      Created: Thu Jul 23, 2013  7:24 AM       See Odette Horns results; Pulmonary consult requested ------

## 2013-07-23 NOTE — Telephone Encounter (Signed)
Patient 's wife has been notified  

## 2013-07-25 ENCOUNTER — Other Ambulatory Visit: Payer: Self-pay | Admitting: Internal Medicine

## 2013-07-27 ENCOUNTER — Other Ambulatory Visit: Payer: Self-pay | Admitting: Internal Medicine

## 2013-07-29 ENCOUNTER — Encounter: Payer: Self-pay | Admitting: Internal Medicine

## 2013-07-29 ENCOUNTER — Ambulatory Visit (INDEPENDENT_AMBULATORY_CARE_PROVIDER_SITE_OTHER): Payer: Medicare Other | Admitting: Internal Medicine

## 2013-07-29 VITALS — BP 112/60 | HR 83 | Temp 98.0°F | Ht 70.0 in | Wt 144.0 lb

## 2013-07-29 DIAGNOSIS — R918 Other nonspecific abnormal finding of lung field: Secondary | ICD-10-CM

## 2013-07-29 DIAGNOSIS — J45909 Unspecified asthma, uncomplicated: Secondary | ICD-10-CM

## 2013-07-29 MED ORDER — CLOTRIMAZOLE 10 MG MT TROC: 10.0000 mg | Freq: Every day | OROMUCOSAL | Status: DC

## 2013-07-29 NOTE — Progress Notes (Signed)
Subjective:    Patient ID: Robert Richard, male    DOB: 06-15-1930  MRN: [id number]  HPI   22 yowm quit smoking 1963 s apparent sequelae but did int the 1980s developed ? Asthma related to sinuses better p sinus surgery ? Late  1980s or early 1990ss and much  better s need for any resp meds  But much worse since cabg [facility] Jan 2015 referred to pulmonary clinic  07/29/2013 by Dr Linna Darner re pulmonary infiltrates new since 07/14/13 in setting of acute fever that resolved p rx with Levaquin 500 x 7 d  by Dr Linna Darner     07/29/2013 first pulmonary eval / [place] Chief Complaint  Patient presents with  . Pulmonary Consult    Referred per Dr. Unice Cobble for eval of pulmonary infiltrate. Pt c/o thrush since Jan 2015. He states that b/c of this it is hard to eat and swallow.   already on daily nystatin but also using symbicort 160 and multiple courses of abx for cough/ congestion. Very inactive but at present Not limited by breathing from desired activities    No obvious day to day or daytime variabilty or assoc excess/purulent sputum   or cp or chest tightness, subjective wheeze overt sinus or hb symptoms. No unusual exp hx or h/o childhood pna/ asthma or knowledge of premature birth.  Sleeping ok without nocturnal  or early am exacerbation  of respiratory  c/o's or need for noct saba. Also denies any obvious fluctuation of symptoms with weather or environmental changes or other aggravating or alleviating factors except as outlined above   Current Medications, Allergies, Complete Past Medical History, Past Surgical History, Family History, and Social History were reviewed in [employer] record.           Review of Systems  Constitutional: Positive for appetite change and unexpected weight change. Negative for fever, chills and activity change.  HENT: Positive for congestion and sneezing. Negative for dental problem, postnasal drip, rhinorrhea, sore throat, trouble  swallowing and voice change.   Eyes: Negative for visual disturbance.  Respiratory: Negative for cough, choking and shortness of breath.   Cardiovascular: Negative for chest pain and leg swelling.  Gastrointestinal: Negative for nausea, vomiting and abdominal pain.  Genitourinary: Negative for difficulty urinating.  Musculoskeletal: Negative for arthralgias.  Skin: Negative for rash.  Psychiatric/Behavioral: Negative for behavioral problems and confusion.       Objective:   Physical Exam  Hoarse amb wm nad at rest  Wt Readings from Last 3 Encounters:  07/29/13 144 lb (65.318 kg)  07/14/13 143 lb (64.864 kg)  06/01/13 150 lb (68.04 kg)      HEENT: nl dentition, turbinates,  Nl external ear canals without cough reflex Moderate oral thrush/ erythema    NECK :  without JVD/Nodes/TM/ nl carotid upstrokes bilaterally   LUNGS: chest with mod/severe thoracic kyphosis, no acc muscle use, clear to A and P bilaterally without cough on insp or exp maneuvers   CV:  RRR  no s3 or murmur or increase in P2, no edema   ABD:  soft and nontender with nl excursion in the supine position. No bruits or organomegaly, bowel sounds nl  MS:  warm without deformities, calf tenderness, cyanosis or clubbing  SKIN: warm and dry without lesions    NEURO:  alert, approp, no deficits     cxr 07/22/13  Persistent bilateral pulmonary infiltrates, left greater than right  show little change. No edema, pleural fluid  or pneumothorax. The  heart size is stable status post prior CABG. The bony thorax shows  stable degenerative disease and old compression fractures of the  thoracic spine.       Assessment & Plan:

## 2013-07-29 NOTE — Patient Instructions (Addendum)
Change zantac (ranitidine) to take after bfast and at bedtime   GERD (REFLUX)  is an extremely common cause of respiratory symptoms, many times with no significant heartburn at all.    It can be treated with medication, but also with lifestyle changes including avoidance of late meals, excessive alcohol, smoking cessation, and avoid fatty foods, chocolate, peppermint, colas, red wine, and acidic juices such as orange juice.  NO MINT OR MENTHOL PRODUCTS SO NO COUGH DROPS  USE SUGARLESS CANDY INSTEAD (jolley ranchers or Stover's)  NO OIL BASED VITAMINS - use powdered substitutes.   Clotrimazole troche 10 mg four times daily x 10 days  Stop symbicort  Please schedule a follow up office visit in 3 weeks, sooner if needed with a cxr

## 2013-07-31 DIAGNOSIS — R918 Other nonspecific abnormal finding of lung field: Secondary | ICD-10-CM | POA: Insufficient documentation

## 2013-07-31 NOTE — Assessment & Plan Note (Addendum)
Baseline cxr 04/01/13 ok p cabg, then 07/14/13 new L > R Post >> ant bilateral infiltrates with 102 fever > rx levaquin x 500 x 7 days  Fever has resolved but does still have FTT with dysphagia and could well have aspirated to start with (suggested by the bilateral post distribution albeit unusual here in that L > R ) and so most likely explanation for infiltrates now is residual ALI/ organizing pna but no need for further abx and certainly no role for steroids here at this point.  For now rx conservatively with acid suppression/ diet and f/u cxr in 3 wk

## 2013-07-31 NOTE — Assessment & Plan Note (Addendum)
Onset in 11s; triggers :seasonal allergies - not using symbicort effectively with refractory thrush > trial off 07/29/13 -in meantime max rx for GERD with acid suppression and diet reviewed as may have element of asp/gerd contributing to his chronic symptoms and ? Recurrent pna?

## 2013-08-04 ENCOUNTER — Other Ambulatory Visit: Payer: Self-pay | Admitting: Internal Medicine

## 2013-08-19 ENCOUNTER — Ambulatory Visit (INDEPENDENT_AMBULATORY_CARE_PROVIDER_SITE_OTHER): Payer: Medicare Other | Admitting: Internal Medicine

## 2013-08-19 ENCOUNTER — Other Ambulatory Visit (INDEPENDENT_AMBULATORY_CARE_PROVIDER_SITE_OTHER): Payer: Medicare Other

## 2013-08-19 ENCOUNTER — Telehealth: Payer: Self-pay | Admitting: *Deleted

## 2013-08-19 ENCOUNTER — Other Ambulatory Visit: Payer: Self-pay | Admitting: Internal Medicine

## 2013-08-19 ENCOUNTER — Encounter: Payer: Self-pay | Admitting: Internal Medicine

## 2013-08-19 ENCOUNTER — Ambulatory Visit (INDEPENDENT_AMBULATORY_CARE_PROVIDER_SITE_OTHER)
Admission: RE | Admit: 2013-08-19 | Discharge: 2013-08-19 | Disposition: A | Discharge status: Home / Self Care | Payer: Medicare Other | Source: Ambulatory Visit | Attending: Internal Medicine | Admitting: Internal Medicine

## 2013-08-19 VITALS — BP 112/60 | HR 61 | Temp 96.7°F | Ht 72.0 in | Wt 145.0 lb

## 2013-08-19 DIAGNOSIS — R918 Other nonspecific abnormal finding of lung field: Secondary | ICD-10-CM

## 2013-08-19 DIAGNOSIS — R070 Pain in throat: Secondary | ICD-10-CM

## 2013-08-19 DIAGNOSIS — J45909 Unspecified asthma, uncomplicated: Secondary | ICD-10-CM

## 2013-08-19 LAB — CBC WITH DIFFERENTIAL/PLATELET
Basophils Absolute: 0.1 10*3/uL (ref 0.0–0.1)
Basophils Relative: 0.5 % (ref 0.0–3.0)
Eosinophils Absolute: 0.3 10*3/uL (ref 0.0–0.7)
Eosinophils Relative: 3.1 % (ref 0.0–5.0)
HCT: 34.5 % — ABNORMAL LOW (ref 39.0–52.0)
Hemoglobin: 11.4 g/dL — ABNORMAL LOW (ref 13.0–17.0)
Lymphocytes Relative: 25.9 % (ref 12.0–46.0)
Lymphs Abs: 2.8 10*3/uL (ref 0.7–4.0)
MCHC: 33.1 g/dL (ref 30.0–36.0)
MCV: 84.1 fl (ref 78.0–100.0)
Monocytes Absolute: 1.5 10*3/uL — ABNORMAL HIGH (ref 0.1–1.0)
Monocytes Relative: 13.7 % — ABNORMAL HIGH (ref 3.0–12.0)
Neutro Abs: 6.2 10*3/uL (ref 1.4–7.7)
Neutrophils Relative %: 56.8 % (ref 43.0–77.0)
Platelets: 259 10*3/uL (ref 150.0–400.0)
RBC: 4.1 Mil/uL — ABNORMAL LOW (ref 4.22–5.81)
RDW: 15.3 % (ref 11.5–15.5)
WBC: 10.8 10*3/uL — ABNORMAL HIGH (ref 4.0–10.5)

## 2013-08-19 LAB — SEDIMENTATION RATE: Sed Rate: 91 mm/hr — ABNORMAL HIGH (ref 0–22)

## 2013-08-19 MED ORDER — PREDNISONE 10 MG PO TABS: ORAL_TABLET | ORAL | Status: DC

## 2013-08-19 NOTE — Assessment & Plan Note (Signed)
Partial response to zantac and clotrimazole and off symbicort > Referred back to Dr Melene Plan

## 2013-08-19 NOTE — Progress Notes (Signed)
Quick Note:  Spoke with spouse and notified of results ______

## 2013-08-19 NOTE — Assessment & Plan Note (Signed)
-   not using symbicort effectively with refractory thrush > trial off 07/29/13   If anything his symptoms are all better off symbiocrt so try to leave off

## 2013-08-19 NOTE — Progress Notes (Signed)
Subjective:    Patient ID: Robert Richard, male    DOB: 08-21-1930  MRN: [id number]     Brief patient profile:  83 yowm quit smoking 1963 s apparent sequelae but did int the 1980s developed ? Asthma related to sinuses better p sinus surgery ? Late  1980s or early 1990ss and much  better s need for any resp meds  But much worse since cabg at [facility] Jan 2015 referred to pulmonary clinic  07/29/2013 by Dr Linna Darner re pulmonary infiltrates new since 07/14/13 in setting of acute fever that resolved p rx with Levaquin 500 x 7 d  by Dr Linna Darner    History of Present Illness  07/29/2013 first pulmonary eval / Robert Richard Chief Complaint  Patient presents with  . Pulmonary Consult    Referred per Dr. Unice Cobble for eval of pulmonary infiltrate. Pt c/o thrush since Jan 2015. He states that b/c of this it is hard to eat and swallow.   already on daily nystatin but also using symbicort 160 and multiple courses of abx for cough/ congestion. Very inactive but at present Not limited by breathing from desired activities  rec Change zantac (ranitidine) to take after bfast and at bedtime  GERD diet  Clotrimazole troche 10 mg four times daily x 10 days Stop symbicort Please schedule a follow up office visit in 3 weeks, sooner if needed with a cxr    08/19/2013 f/u ov/Robert Richard re: pulmonary infiltrates with high esr/ symptoms improved off symbicort and p rx with clotrimazole but did not resolve Chief Complaint  Patient presents with  . Follow-up    C/o mild cough with intermittent yellow and grey mucous production. Pt still c/o thrush since Jan 2015.  Pt denies SOB and CP.  Not limited by breathing from desired activities   Pain in mouth in eat seafood is at least 50% better/ throat worse than tongue  Congested cough esp in am, no fever, chills No need for saba   No obvious day to day or daytime variabilty or assoc   cp or chest tightness, subjective wheeze overt sinus or hb symptoms. No unusual exp hx or h/o  childhood pna/ asthma or knowledge of premature birth.  Sleeping ok without nocturnal  or early am exacerbation  of respiratory  c/o's or need for noct saba. Also denies any obvious fluctuation of symptoms with weather or environmental changes or other aggravating or alleviating factors except as outlined above   Current Medications, Allergies, Complete Past Medical History, Past Surgical History, Family History, and Social History were reviewed in [employer] record.  ROS  The following are not active complaints unless bolded sore throat, dysphagia, dental problems, itching, sneezing,  nasal congestion or excess/ purulent secretions, ear ache,   fever, chills, sweats, unintended wt loss, pleuritic or exertional cp, hemoptysis,  orthopnea pnd or leg swelling, presyncope, palpitations, heartburn, abdominal pain, anorexia, nausea, vomiting, diarrhea  or change in bowel or urinary habits, change in stools or urine, dysuria,hematuria,  rash, arthralgias, visual complaints, headache, numbness weakness or ataxia or problems with walking or coordination,  change in mood/affect or memory.             Objective:  Physical Exam:  Less Hoarse amb wm nad at rest/ very evasive with questions re symptoms, prefers to use medical dx's  Wt Readings from Last 3 Encounters:  08/19/13 145 lb (65.772 kg)  07/29/13 144 lb (65.318 kg)  07/14/13 143 lb (64.864 kg)  12/2012  155   HEENT: nl dentition, turbinates,  Nl external ear canals without cough reflex No erythema / no typical thrush but slt hypopigmented plaques post tongue.   NECK :  without JVD/Nodes/TM/ nl carotid upstrokes bilaterally   LUNGS: chest with mod/severe thoracic kyphosis, no acc muscle use, clear to A and P bilaterally without cough on insp or exp maneuvers   CV:  RRR  no s3 or murmur or increase in P2, no edema   ABD:  soft and nontender with nl excursion in the supine position. No bruits or  organomegaly, bowel sounds nl  MS:  warm without deformities, calf tenderness, cyanosis or clubbing  SKIN: warm and dry without lesions    NEURO:  alert, approp, no deficits    CXR  08/19/2013 :  Interval improvement without resolution of the basilar airspace disease.     Lab Results  Component Value Date   ESRSEDRATE 91* 08/19/2013   ESRSEDRATE 128* 02/26/2013    08/19/2013 no sign eos     Assessment & Plan:

## 2013-08-19 NOTE — Telephone Encounter (Signed)
Spoke with the pt's spouse and notified of recs per MW She verbalized understanding and denied any questions  Rx for pred sent to pharm

## 2013-08-19 NOTE — Telephone Encounter (Signed)
Message copied by Rosana Berger on Wed Aug 19, 2013  2:41 PM ------      Message from: Christinia Gully B      Created: Wed Aug 19, 2013  1:06 PM       P  Chart review rec      Prednisone 10 mg take  4 each am x 2 days,   2 each am x 2 days,  1 each am x 2 days and stop  ------

## 2013-08-19 NOTE — Patient Instructions (Signed)
Please see patient coordinator before you leave today  to schedule eval by Dr Melene Plan and barium swallow  Please remember to go to the lab and x-ray department downstairs for your tests - we will call you with the results when they are available.  Please schedule a follow up office visit in 4 weeks, sooner if needed

## 2013-08-19 NOTE — Assessment & Plan Note (Signed)
Baseline cxr 04/01/13 ok p cabg, then 07/14/13 new L > R Post >> ant bilateral infiltrates with 102 fever > rx levaquin x 500 x 7 days> fever resolved - ESR 08/19/2013 = 91 > Prednisone 10 mg take  4 each am x 2 days,   2 each am x 2 days,  1 each am x 2 days and stop - BaSwallow ordered to r/o asp mechanism  Most likely this is just organized pna at this point and not BOOP so leave off furhter abx and just rx with very shortterm steroids

## 2013-08-24 ENCOUNTER — Ambulatory Visit (HOSPITAL_COMMUNITY)
Admission: RE | Admit: 2013-08-24 | Discharge: 2013-08-24 | Disposition: A | Discharge status: Home / Self Care | Payer: Medicare Other | Source: Ambulatory Visit | Attending: Internal Medicine | Admitting: Internal Medicine

## 2013-08-24 ENCOUNTER — Encounter: Payer: Self-pay | Admitting: Internal Medicine

## 2013-08-24 DIAGNOSIS — R1310 Dysphagia, unspecified: Secondary | ICD-10-CM | POA: Insufficient documentation

## 2013-08-24 DIAGNOSIS — R918 Other nonspecific abnormal finding of lung field: Secondary | ICD-10-CM

## 2013-08-24 NOTE — Progress Notes (Signed)
Quick Note:  Spoke with pt and notified of results per Dr. Wert. Pt verbalized understanding and denied any questions.  ______ 

## 2013-09-02 ENCOUNTER — Other Ambulatory Visit: Payer: Self-pay | Admitting: Internal Medicine

## 2013-09-02 NOTE — Telephone Encounter (Signed)
OK X1 

## 2013-09-16 ENCOUNTER — Other Ambulatory Visit (INDEPENDENT_AMBULATORY_CARE_PROVIDER_SITE_OTHER): Payer: Medicare Other

## 2013-09-16 ENCOUNTER — Encounter: Payer: Self-pay | Admitting: Internal Medicine

## 2013-09-16 ENCOUNTER — Ambulatory Visit (INDEPENDENT_AMBULATORY_CARE_PROVIDER_SITE_OTHER): Payer: Medicare Other | Admitting: Internal Medicine

## 2013-09-16 ENCOUNTER — Ambulatory Visit (INDEPENDENT_AMBULATORY_CARE_PROVIDER_SITE_OTHER)
Admission: RE | Admit: 2013-09-16 | Discharge: 2013-09-16 | Disposition: A | Discharge status: Home / Self Care | Payer: Medicare Other | Source: Ambulatory Visit | Attending: Internal Medicine | Admitting: Internal Medicine

## 2013-09-16 VITALS — BP 114/60 | HR 60 | Temp 98.0°F | Ht 72.0 in | Wt 144.6 lb

## 2013-09-16 DIAGNOSIS — R918 Other nonspecific abnormal finding of lung field: Secondary | ICD-10-CM

## 2013-09-16 DIAGNOSIS — R0600 Dyspnea, unspecified: Secondary | ICD-10-CM

## 2013-09-16 DIAGNOSIS — R0609 Other forms of dyspnea: Secondary | ICD-10-CM

## 2013-09-16 DIAGNOSIS — R0989 Other specified symptoms and signs involving the circulatory and respiratory systems: Secondary | ICD-10-CM

## 2013-09-16 DIAGNOSIS — Z23 Encounter for immunization: Secondary | ICD-10-CM

## 2013-09-16 LAB — CBC WITH DIFFERENTIAL/PLATELET
Basophils Absolute: 0 10*3/uL (ref 0.0–0.1)
Basophils Relative: 0.2 % (ref 0.0–3.0)
Eosinophils Absolute: 0.4 10*3/uL (ref 0.0–0.7)
Eosinophils Relative: 3.2 % (ref 0.0–5.0)
HCT: 37.2 % — ABNORMAL LOW (ref 39.0–52.0)
Hemoglobin: 12.1 g/dL — ABNORMAL LOW (ref 13.0–17.0)
Lymphocytes Relative: 16.4 % (ref 12.0–46.0)
Lymphs Abs: 2.3 10*3/uL (ref 0.7–4.0)
MCHC: 32.7 g/dL (ref 30.0–36.0)
MCV: 85.5 fl (ref 78.0–100.0)
Monocytes Absolute: 1.5 10*3/uL — ABNORMAL HIGH (ref 0.1–1.0)
Monocytes Relative: 11 % (ref 3.0–12.0)
Neutro Abs: 9.6 10*3/uL — ABNORMAL HIGH (ref 1.4–7.7)
Neutrophils Relative %: 69.2 % (ref 43.0–77.0)
Platelets: 198 10*3/uL (ref 150.0–400.0)
RBC: 4.35 Mil/uL (ref 4.22–5.81)
RDW: 17 % — ABNORMAL HIGH (ref 11.5–15.5)
WBC: 13.9 10*3/uL — ABNORMAL HIGH (ref 4.0–10.5)

## 2013-09-16 LAB — BRAIN NATRIURETIC PEPTIDE: Pro B Natriuretic peptide (BNP): 223 pg/mL — ABNORMAL HIGH (ref 0.0–100.0)

## 2013-09-16 LAB — SEDIMENTATION RATE: Sed Rate: 53 mm/hr — ABNORMAL HIGH (ref 0–22)

## 2013-09-16 LAB — TSH: TSH: 1.95 u[IU]/mL (ref 0.35–4.50)

## 2013-09-16 NOTE — Progress Notes (Signed)
Subjective:    Patient ID: Robert Richard, male    DOB: [birth year]  MRN: [id number]     Brief patient profile:  83 yowm quit smoking 1963 s apparent sequelae but did int the 1980s developed ? Asthma related to sinuses better p sinus surgery ? Late  1980s or early 1990ss and much  better s need for any resp meds  But much worse since [facility] Jan 2015 referred to pulmonary clinic  07/29/2013 by Dr Linna Darner re pulmonary infiltrates new since 07/14/13 in setting of acute fever that resolved p rx with Levaquin 500 x 7 d  by Dr Linna Darner    History of Present Illness  07/29/2013 first pulmonary eval / Robert Richard Chief Complaint  Patient presents with  . Pulmonary Consult    Referred per Dr. Unice Cobble for eval of pulmonary infiltrate. Pt c/o thrush since Jan 2015. He states that b/c of this it is hard to eat and swallow.   already on daily nystatin but also using symbicort 160 and multiple courses of abx for cough/ congestion. Very inactive but at present Not limited by breathing from desired activities  rec Change zantac (ranitidine) to take after bfast and at bedtime  GERD diet  Clotrimazole troche 10 mg four times daily x 10 days Stop symbicort Please schedule a follow up office visit in 3 weeks, sooner if needed with a cxr    08/19/2013 f/u ov/Robert Richard re: pulmonary infiltrates with high esr/ symptoms improved off symbicort and p rx with clotrimazole but did not resolve Chief Complaint  Patient presents with  . Follow-up    C/o mild cough with intermittent yellow and grey mucous production. Pt still c/o thrush since Jan 2015.  Pt denies SOB and CP.  Not limited by breathing from desired activities   Pain in mouth p eat seafood is at least 50% better/ throat worse than tongue  Congested cough esp in am, no fever, chills No need for saba  rec eval by [name] 76/14/15 > dx apthos ulcers, non specific glossitis> rx pred x 12 days    09/16/2013 f/u ov/Robert Richard re: last dose of prednisone was 09/13/13    Chief Complaint  Patient presents with  . Follow-up    Pt states that his cough is much improved. Has occ, min yellow sputum in the am's. No new co's today.   overall the best he's been in months, some night sweats off and on no def chills, no problem swallowing,  No documented fever.  Not limited by breathing from desired activities    No obvious day to day or daytime variabilty or assoc   cp or chest tightness, subjective wheeze overt sinus or hb symptoms. No unusual exp hx or h/o childhood pna/ asthma or knowledge of premature birth.  Sleeping ok without nocturnal  or early am exacerbation  of respiratory  c/o's or need for noct saba. Also denies any obvious fluctuation of symptoms with weather or environmental changes or other aggravating or alleviating factors except as outlined above   Current Medications, Allergies, Complete Past Medical History, Past Surgical History, Family History, and Social History were reviewed in [employer] record.  ROS  The following are not active complaints unless bolded sore throat, dysphagia, dental problems, itching, sneezing,  nasal congestion or excess/ purulent secretions, ear ache,   fever, chills, sweats off and on for years,  unintended wt loss, pleuritic or exertional cp, hemoptysis,  orthopnea pnd or leg swelling, presyncope, palpitations,  heartburn, abdominal pain, anorexia, nausea, vomiting, diarrhea  or change in bowel or urinary habits, change in stools or urine, dysuria,hematuria,  rash, arthralgias, visual complaints, headache, numbness weakness or ataxia or problems with walking or coordination,  change in mood/affect or memory.             Objective:  Physical Exam:  Less Hoarse amb wm nad at rest   09/16/2013        144  Wt Readings from Last 3 Encounters:  08/19/13 145 lb (65.772 kg)  07/29/13 144 lb (65.318 kg)  07/14/13 143 lb (64.864 kg)  12/2012           155   HEENT: nl dentition, turbinates,  Nl  external ear canals without cough reflex No erythema / no thrush     NECK :  without JVD/Nodes/TM/ nl carotid upstrokes bilaterally   LUNGS: chest with mod/severe thoracic kyphosis, no acc muscle use, clear to A and P bilaterally without cough on insp or exp maneuvers   CV:  RRR  no s3 or murmur or increase in P2, no edema   ABD:  soft and nontender with nl excursion in the supine position. No bruits or organomegaly, bowel sounds nl  MS:  warm without deformities, calf tenderness, cyanosis or clubbing  SKIN: warm and dry without lesions    NEURO:  alert, approp, no deficits      CXR  09/16/2013 : Mild persistent left lower lobe airspace disease. No new focal parenchymal opacities.    Lab Results  Component Value Date   ESRSEDRATE 53* 09/16/2013   ESRSEDRATE 91* 08/19/2013   ESRSEDRATE 128* 02/26/2013     Lab Results  Component Value Date   PROBNP 223.0* 09/16/2013     Recent Labs Lab 09/16/13 1113  HGB 12.1*  HCT 37.2*  WBC 13.9*  PLT 198.0           Assessment & Plan:

## 2013-09-16 NOTE — Progress Notes (Signed)
Quick Note:  LMTCB ______ 

## 2013-09-16 NOTE — Assessment & Plan Note (Addendum)
Baseline cxr 04/01/13 ok p cabg, then 07/14/13 new L > R Post >> ant bilateral infiltrates with 102 fever > rx levaquin x 500 x 7 days> fever resolved - ESR 08/19/2013 = 91 > Prednisone 10 mg take  4 each am x 2 days,   2 each am x 2 days,  1 each am x 2 days and stop - BaSwallow 08/24/2013 > Negative esophagram - 09/16/2013 ESR =  53   Most likely this is some form of inflammatory process that is steroid responsive and in the absence of obvious connective tissue dx or assoc eosinophia  it is most c/w BOOP pos ALI ? pna related but also could be idiopathic which is much more likely to flare off steroids for up to a year - will f/u with cxr in one month, sooner if regresses, but no further abx for now     Each maintenance medication was reviewed in detail including most importantly the difference between maintenance and as needed and under what circumstances the prns are to be used.  Please see instructions for details which were reviewed in writing and the patient given a copy.

## 2013-09-16 NOTE — Assessment & Plan Note (Signed)
No evidence of sign chf

## 2013-09-16 NOTE — Patient Instructions (Signed)
Please remember to go to the lab and x-ray department downstairs for your tests - we will call you with the results when they are available.      

## 2013-09-17 NOTE — Progress Notes (Signed)
Quick Note:  Spoke with pt's spouse and notified of results per Dr. Wert. She verbalized understanding and denied any questions.  ______ 

## 2013-10-04 NOTE — Progress Notes (Signed)
History of Present Illness:  Patient is an 80 yo who is s/p CABG x 3 (LIMA to LAD; SVG to OM1; SVG to PDA) and AVR (#23 mm [facility] Ease pericardial valve.  Post op course long/complicated.    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 Since seen he has done OK  Breathing is OK  No CP  Slowly regaining strength but still weak in legs  I saw the patient in clinic earlier this year.   He continues to move about slowly  Sprained ankle and is recovering.   Denies CP  Breathing is steady  Being followed in pulm by Selinda Orion for probable inflammatory lung process Appetite remains depressed  Current Outpatient Prescriptions  Medication Sig Dispense Refill  . aspirin EC 81 MG tablet Take 81 mg by mouth daily.      . busPIRone (BUSPAR) 15 MG tablet TAKE 1/2 TABLET 2 TIMES A DAY  90 tablet  0  . diltiazem (CARDIZEM CD) 240 MG 24 hr capsule Take 1 capsule (240 mg total) by mouth daily.  30 capsule  3  . diphenhydrAMINE (BENADRYL) 25 mg capsule Take 25 mg by mouth daily as needed for allergies.       . feeding supplement, ENSURE COMPLETE, (ENSURE COMPLETE) LIQD Take 237 mLs by mouth 2 (two) times daily between meals.  60 Bottle  0  . ketotifen (ZADITOR) 0.025 % ophthalmic solution Place 1 drop into both eyes daily.       . Lidocaine HCl 4 % SOLN Apply 4 mLs topically as needed (for migraines).       . metoprolol tartrate (LOPRESSOR) 25 MG tablet Take 1 tablet (25 mg total) by mouth 2 (two) times daily.  60 tablet  3  . nitroGLYCERIN (NITROSTAT) 0.4 MG SL tablet Place 0.4 mg under the tongue every 5 (five) minutes as needed for chest pain.      . polyethylene glycol (MIRALAX / GLYCOLAX) packet Take 17 g by mouth daily.  14 each  0  . pravastatin (PRAVACHOL) 40 MG tablet TAKE ONE (1) TABLET EACH DAY  90 tablet  3  . ranitidine (ZANTAC) 150 MG tablet TAKE ONE TABLET TWICE  DAILY  180 tablet  3  . topiramate (TOPAMAX) 25 MG tablet Take 75 mg by mouth daily.      . traMADol (ULTRAM) 50 MG tablet Take 1-2 tablets (50-100 mg total) by mouth every 6 (six) hours as needed for moderate pain.  30 tablet  0  . XARELTO 20 MG TABS tablet TAKE ONE TABLET BY MOUTH DAILY WITH SUPPER  30 tablet  6   No current facility-administered medications for this visit.    Allergies  Allergen Reactions  . Ivp Dye [Iodinated Diagnostic Agents] Hives    Past Medical History  Diagnosis Date  . Anemia   . Colon polyp   . COPD (chronic obstructive pulmonary disease)     Astmatic bronchitis  . Asthma with bronchitis   . Gout     PMH  . Osteoporosis   . Allergic rhinitis   . Skin cancer, basal cell     Dr Nevada Crane, PMH  . Nephrolithiasis   . HLD (hyperlipidemia)   . Skin cancer (melanoma) 2012    Right neck  . Arthritis     SHOULDER  . Headache(784.0)   . RLS (restless legs syndrome)   .  Dysrhythmia 08/2012    NEW ONSET ATRIAL FIBRILATION    Past Surgical History  Procedure Laterality Date  . Finger surgery      for foreign body  . Colonoscopy w/ polypectomy      Dr Deatra Ina  . Inguinal hernia repair    . Nose surgery      Septal Deviation  . Neck surgery  12/2010    Melanoma ; [facility]   . Leg surgery  06/2010     Dr.Lawson, for blood clott. Left leg   . Tee without cardioversion N/A 01/23/2013    Procedure: TRANSESOPHAGEAL ECHOCARDIOGRAM (TEE);  Surgeon: Jolaine Artist, MD;  Location: [facility] ENDOSCOPY;  Service: Cardiovascular;  Laterality: N/A;  sign heart cath consent w/tee consent  . Coronary artery bypass graft N/A 02/20/2013    Procedure: CORONARY ARTERY BYPASS GRAFTING (CABG);  Surgeon: Ivin Poot, MD;  Location: [place];  Service: Open Heart Surgery;  Laterality: N/A;  . Aortic valve replacement N/A 02/20/2013    Procedure: AORTIC VALVE REPLACEMENT (AVR);  Surgeon: Ivin Poot, MD;  Location: [place];  Service: Open Heart Surgery;  Laterality: N/A;  .  Intraoperative transesophageal echocardiogram N/A 02/20/2013    Procedure: INTRAOPERATIVE TRANSESOPHAGEAL ECHOCARDIOGRAM;  Surgeon: Ivin Poot, MD;  Location: [place];  Service: Open Heart Surgery;  Laterality: N/A;    History  Smoking status  . Former Smoker -- 3.00 packs/day for 15 years  . Types: Cigarettes  . Quit date: 02/19/1961  Smokeless tobacco  . Never Used    History  Alcohol Use No    Family History  Problem Relation Age of Onset  . Allergies Mother   . Coronary artery disease Mother   . Allergy (severe) Mother   . Allergies Father   . Stroke Father   . Heart attack Father 74  . Asthma Father   . Allergy (severe) Father   . Allergies Sister   . Allergy (severe) Sister   . Allergies Brother     x2  . Allergy (severe) Brother   . Diabetes Brother   . Allergy (severe) Brother   . Emphysema Brother     smoked  . Cancer Son     Review of Systems: The review of systems is per the HPI.  All other systems were reviewed and are negative.  Physical Exam: BP 122/70  Pulse 64  Ht 6' (1.829 m)  Wt 146 lb (66.225 kg)  BMI 19.80 kg/m2  SpO2 98% Patient is very pleasant and in no acute distress. Looks a little frail and thin.  HEENT is unremarkable. Normocephalic/atraumatic. PERRL. Sclera are nonicteric.  Neck is supple. No masses. No JVD.  Lungs are CTA . Cardiac exam shows a regular rate and rhythm  No mumurs  Abdomen is soft. Extremities are without edema.   LABORATORY DATA: PENDING  Lab Results  Component Value Date   WBC 13.9* 09/16/2013   HGB 12.1* 09/16/2013   HCT 37.2* 09/16/2013   PLT 198.0 09/16/2013   GLUCOSE 105* 07/14/2013   CHOL 153 04/20/2013   TRIG 122.0 04/20/2013   HDL 35.00* 04/20/2013   LDLCALC 94 04/20/2013   ALT 28 04/20/2013   AST 25 04/20/2013   NA 134* 07/14/2013   K 4.2 07/14/2013   CL 105 07/14/2013   CREATININE 1.4 07/14/2013   BUN 25* 07/14/2013   CO2 19 07/14/2013   TSH 1.95 09/16/2013   INR 2.1 05/07/2013   HGBA1C 6.4* 03/15/2013     Lab  Results  Component Value Date   INR 2.1 05/07/2013   INR 1.7 04/27/2013   INR 3.0 04/13/2013   Echo Study Conclusions  - Left ventricle: The cavity size was normal. Wall thickness was increased in a pattern of mild LVH. Systolic function was vigorous. The estimated ejection fraction was in the range of 65% to 70%. - Aortic valve: AV prosthesis is diffilcult to see. Peak and mean gradients through the prosthesis are 31 and 18 mm Hg respectively. Valve area: 1.42cm^2(VTI). Valve area: 1.27cm^2 (Vmax). - Mitral valve: Calcified annulus. Moderately thickened, moderately calcified leaflets posterior. Valve area by continuity equation (using LVOT flow): 1.18cm^2. - Left atrium: The atrium was severely dilated. - Right atrium: The atrium was mildly dilated.    Assessment / Plan: 1. Post CABG/AVR - Slowly recovering  2. HTN - Good control  3. HLD - on statin   4. PAF -Clinically in [place]  ON Xarelto   5.  Pulm  Continue follow up with Selinda Orion.

## 2013-10-05 ENCOUNTER — Encounter: Payer: Self-pay | Admitting: Internal Medicine

## 2013-10-05 ENCOUNTER — Ambulatory Visit (INDEPENDENT_AMBULATORY_CARE_PROVIDER_SITE_OTHER): Payer: Medicare Other | Admitting: Internal Medicine

## 2013-10-05 VITALS — BP 122/70 | HR 64 | Ht 72.0 in | Wt 146.0 lb

## 2013-10-05 DIAGNOSIS — I25810 Atherosclerosis of coronary artery bypass graft(s) without angina pectoris: Secondary | ICD-10-CM

## 2013-10-05 NOTE — Patient Instructions (Addendum)
Your physician recommends that you continue on your current medications as directed. Please refer to the Current Medication list given to you today.  Your physician wants you to follow-up in: 6 MONTHS WITH DR. ROSS.   You will receive a reminder letter in the mail two months in advance. If you don't receive a letter, please call our office to schedule the follow-up appointment.  

## 2013-10-19 ENCOUNTER — Encounter: Payer: Self-pay | Admitting: Internal Medicine

## 2013-10-19 ENCOUNTER — Ambulatory Visit (INDEPENDENT_AMBULATORY_CARE_PROVIDER_SITE_OTHER)
Admission: RE | Admit: 2013-10-19 | Discharge: 2013-10-19 | Disposition: A | Discharge status: Home / Self Care | Payer: Medicare Other | Source: Ambulatory Visit | Attending: Internal Medicine | Admitting: Internal Medicine

## 2013-10-19 ENCOUNTER — Ambulatory Visit (INDEPENDENT_AMBULATORY_CARE_PROVIDER_SITE_OTHER): Payer: Medicare Other | Admitting: Internal Medicine

## 2013-10-19 ENCOUNTER — Other Ambulatory Visit (INDEPENDENT_AMBULATORY_CARE_PROVIDER_SITE_OTHER): Payer: Medicare Other

## 2013-10-19 VITALS — BP 120/60 | HR 70 | Temp 98.0°F | Ht 72.0 in | Wt 145.0 lb

## 2013-10-19 DIAGNOSIS — R918 Other nonspecific abnormal finding of lung field: Secondary | ICD-10-CM

## 2013-10-19 DIAGNOSIS — J449 Chronic obstructive pulmonary disease, unspecified: Secondary | ICD-10-CM

## 2013-10-19 DIAGNOSIS — J189 Pneumonia, unspecified organism: Secondary | ICD-10-CM

## 2013-10-19 LAB — SEDIMENTATION RATE: Sed Rate: 96 mm/hr — ABNORMAL HIGH (ref 0–22)

## 2013-10-19 NOTE — Progress Notes (Signed)
Subjective:    Patient ID: Robert Richard, male    DOB: 01-Oct-1930  MRN: [id number]     Brief patient profile:  83 yowm quit smoking 1963 s apparent sequelae but in the 1980s developed ? Asthma related to sinuses better p sinus surgery ? Late  1980s or early 1990s and much  better s need for any resp meds  But much worse since cabg [facility] Jan 2015 referred to pulmonary clinic  07/29/2013 by Dr Robert Richard re pulmonary infiltrates new since 07/14/13 in setting of acute fever that resolved p rx with Levaquin 500 x 7 d  by Dr Robert Richard    History of Present Illness  07/29/2013 first pulmonary eval / Robert Richard Chief Complaint  Patient presents with  . Pulmonary Consult    Referred per Dr. Unice Richard for eval of pulmonary infiltrate. Pt c/o thrush since Jan 2015. He states that b/c of this it is hard to eat and swallow.   already on daily nystatin but also using symbicort 160 and multiple courses of abx for cough/ congestion. Very inactive but at present Not limited by breathing from desired activities  rec Change zantac (ranitidine) to take after bfast and at bedtime  GERD diet  Clotrimazole troche 10 mg four times daily x 10 days Stop symbicort      08/19/2013 f/u ov/Robert Richard re: pulmonary infiltrates with high esr/ symptoms improved off symbicort and p rx with clotrimazole but did not resolve Chief Complaint  Patient presents with  . Follow-up    C/o mild cough with intermittent yellow and grey mucous production. Pt still c/o thrush since Jan 2015.  Pt denies SOB and CP.  Not limited by breathing from desired activities   Pain in mouth p eat seafood is at least 50% better/ throat worse than tongue  Congested cough esp in am, no fever, chills No need for saba  rec eval by Robert Richard 76/14/15 > dx apthos ulcers, non specific glossitis> rx pred x 12 days    09/16/2013 f/u ov/Robert Richard re: last dose of prednisone was 09/13/13  Chief Complaint  Patient presents with  . Follow-up    Pt states that his cough is  much improved. Has occ, min yellow sputum in the am's. No new co's today.   overall the best he's been in months, some night sweats off and on no def chills, no problem swallowing,  No documented fever.  Not limited by breathing from desired activities   rec No change rx   10/19/2013 f/u ov/Robert Richard re:  Chief Complaint  Patient presents with  . Follow-up    Pt states his breathing is doing well and he denies any new co's today.   mailbox and back ok Grocery store shopping ok  Can't identify one activity he wants to do that he can't but wife says "too tired to do anything and sleeps a lot.   No obvious day to day or daytime variabilty or assoc cough or  cp or chest tightness, subjective wheeze overt sinus or hb symptoms. No unusual exp hx or h/o childhood pna/ asthma or knowledge of premature birth.  Sleeping ok without nocturnal  or early am exacerbation  of respiratory  c/o's or need for noct saba. Also denies any obvious fluctuation of symptoms with weather or environmental changes or other aggravating or alleviating factors except as outlined above   Current Medications, Allergies, Complete Past Medical History, Past Surgical History, Family History, and Social History were reviewed in [employer]  electronic medical record.  ROS  The following are not active complaints unless bolded sore throat, dysphagia, dental problems, itching, sneezing,  nasal congestion or excess/ purulent secretions, ear ache,   fever, chills, sweats off and on for years better,  unintended wt loss, pleuritic or exertional cp, hemoptysis,  orthopnea pnd or leg swelling, presyncope, palpitations, heartburn, abdominal pain, anorexia, nausea, vomiting, diarrhea  or change in bowel or urinary habits, change in stools or urine, dysuria,hematuria,  rash, arthralgias, visual complaints, headache, numbness weakness or ataxia or problems with walking or coordination,  change in mood/affect or memory.               Objective:  Physical Exam:  Mildly  Hoarse amb wm nad at rest   09/16/2013        144  > 10/19/2013  145  Wt Readings from Last 3 Encounters:  08/19/13 145 lb (65.772 kg)  07/29/13 144 lb (65.318 kg)  07/14/13 143 lb (64.864 kg)  12/2012           155   HEENT: nl dentition, turbinates,  Nl external ear canals without cough reflex No erythema / no thrush     NECK :  without JVD/Nodes/TM/ nl carotid upstrokes bilaterally   LUNGS: chest with mod/severe thoracic kyphosis, no acc muscle use, clear to A and P bilaterally without cough on insp or exp maneuvers   CV:  RRR  no s3 or murmur or increase in P2, no edema   ABD:  soft and nontender with nl excursion in the supine position. No bruits or organomegaly, bowel sounds nl  MS:  warm without deformities, calf tenderness, cyanosis or clubbing  SKIN: warm and dry without lesions    NEURO:  alert, approp, no deficits      CXR  10/19/2013 :  Central mild bronchitic changes. No segmental infiltrate or pulmonary edema. Persistent streaky left lower lobe medially atelectasis, scarring or residual infiltrate. My review: vs 07/22/13 marked interval/serial improvement aeration bilaterally   Lab Results  Component Value Date   ESRSEDRATE 96* 10/19/2013       Lab Results  Component Value Date   ESRSEDRATE 53* 09/16/2013   ESRSEDRATE 91* 08/19/2013   ESRSEDRATE 128* 02/26/2013        Assessment & Plan:

## 2013-10-19 NOTE — Assessment & Plan Note (Signed)
No evidence of pna

## 2013-10-19 NOTE — Progress Notes (Signed)
Quick Note:  LMTCB ______ 

## 2013-10-19 NOTE — Patient Instructions (Addendum)
Stop benadryl and take Clariton 10 mg one daily as needed for itching sneezing runny nose  Please remember to go to the lab and x-ray department downstairs for your tests - we will call you with the results when they are available.  Increase your activity level as tolerated - pulmonary follow up is as needed

## 2013-10-19 NOTE — Assessment & Plan Note (Signed)
Baseline cxr 04/01/13 ok p cabg, then 07/14/13 new L > R Post >> ant bilateral infiltrates with 102 fever > rx levaquin x 500 x 7 days> fever resolved > cxr back to baseline 10/19/2013  - ESR 08/19/2013 = 91 > Prednisone 10 mg take  4 each am x 2 days,   2 each am x 2 days,  1 each am x 2 days and stop - BaSwallow 08/24/2013 > Negative esophagram  Most likely this was CAP or HCAP complicated by organizing pna / BOOP like but has resolved radiographically and no relapse off steroids- ESR back up off pred with lots of fatigue but no myalgias/arthralgias so no clear source for this but doing so well no further pulmonary f/u planned

## 2013-10-20 ENCOUNTER — Telehealth: Payer: Self-pay | Admitting: Internal Medicine

## 2013-10-20 NOTE — Progress Notes (Signed)
Quick Note:  Spoke with pt and notified of results per Dr. Wert. Pt verbalized understanding and denied any questions.  ______ 

## 2013-10-20 NOTE — Telephone Encounter (Signed)
Spoke with pt and notified of results per Dr. Melvyn Novas. Pt verbalized understanding and nothing further needed

## 2013-11-05 ENCOUNTER — Other Ambulatory Visit: Payer: Self-pay | Admitting: Internal Medicine

## 2013-11-10 ENCOUNTER — Ambulatory Visit (INDEPENDENT_AMBULATORY_CARE_PROVIDER_SITE_OTHER): Payer: Medicare Other | Admitting: Nurse Practitioner

## 2013-11-10 ENCOUNTER — Encounter: Payer: Self-pay | Admitting: Nurse Practitioner

## 2013-11-10 VITALS — BP 130/62 | HR 62 | Temp 97.4°F | Ht 72.0 in | Wt 146.4 lb

## 2013-11-10 DIAGNOSIS — Z23 Encounter for immunization: Secondary | ICD-10-CM

## 2013-11-10 DIAGNOSIS — Z0000 Encounter for general adult medical examination without abnormal findings: Secondary | ICD-10-CM

## 2013-11-10 NOTE — Patient Instructions (Signed)
Follow up with Audiologist and Opthalmologist Keep appointment with Dr. Linna Darner Continue current exercise and supplement diet 2 cans of ensure daily.    Advance Directive Advance directives are the legal documents that allow you to make choices about your health care and medical treatment if you cannot speak for yourself. Advance directives are a way for you to communicate your wishes to family, friends, and health care providers. The specified people can then convey your decisions about end-of-life care to avoid confusion if you should become unable to communicate. Ideally, the process of discussing and writing advance directives should happen over time rather than making decisions all at once. Advance directives can be modified as your situation changes, and you can change your mind at any time, even after you have signed the advance directives. Each state has its own laws regarding advance directives. You may want to check with your health care provider, attorney, or state representative about the law in your state. Below are some examples of advance directives. LIVING WILL A living will is a set of instructions documenting your wishes about medical care when you cannot care for yourself. It is used if you become:  Terminally ill.  Incapacitated.  Unable to communicate.  Unable to make decisions. Items to consider in your living will include:  The use or non-use of life-sustaining equipment, such as dialysis machines and breathing machines (ventilators).  A do not resuscitate (DNR) order, which is the instruction not to use cardiopulmonary resuscitation (CPR) if breathing or heartbeat stops.  Tube feeding.  Withholding of food and fluids.  Comfort (palliative) care when the goal becomes comfort rather than a cure.  Organ and tissue donation. A living will does not give instructions about distribution of your money and property if you should pass away. It is advisable to seek the  expert advice of a lawyer in drawing up a will regarding your possessions. Decisions about taxes, beneficiaries, and asset distribution will be legally binding. This process can relieve your family and friends of any burdens surrounding disputes or questions that may come up about the allocation of your assets. DO NOT RESUSCITATE (DNR) A do not resuscitate (DNR) order is a request to not have CPR in the event that your heart stops beating or you stop breathing. Unless given other instructions, a health care provider will try to help any patient whose heart has stopped or who has stopped breathing.  HEALTH CARE PROXY AND DURABLE POWER OF ATTORNEY FOR HEALTH CARE A health care proxy is a person (agent) appointed to make medical decisions for you if you cannot. Generally, people choose someone they know well and trust to represent their preferences when they can no longer do so. You should be sure to ask this person for agreement to act as your agent. An agent may have to exercise judgment in the event of a medical decision for which your wishes are not known. The durable power of attorney for health care is the legal document that names your health care proxy. Once written, it should be:  Signed.  Notarized.  Dated.  Copied.  Witnessed.  Incorporated into your medical record. You may also want to appoint someone to manage your financial affairs if you cannot. This is called a durable power of attorney for finances. It is a separate legal document from the durable power of attorney for health care. You may choose the same person or someone different from your health care proxy to act as your agent in  financial matters. Document Released: 05/15/2007 Document Revised: 02/10/2013 Document Reviewed: 06/25/2012 [employer] Patient Information 2015 [place], [place]. This information is not intended to replace advice given to you by your health care provider. Make sure you discuss any questions you have with  your health care provider.

## 2013-11-10 NOTE — Progress Notes (Signed)
Pre visit review using our clinic review tool, if applicable. No additional management support is needed unless otherwise documented below in the visit note. 

## 2013-11-10 NOTE — Progress Notes (Signed)
Subjective:    Robert Richard is a 80 y.o. male who presents for Medicare Annual/Subsequent preventive examination.   Preventive Screening-Counseling & Management  Tobacco History  Smoking status  . Former Smoker -- 3.00 packs/day for 15 years  . Types: Cigarettes  . Quit date: 02/19/1961  Smokeless tobacco  . Never Used    Problems Prior to Visit 1.   Current Problems (verified) Patient Active Problem List   Diagnosis Date Noted  . Dyspnea 09/16/2013  . Throat pain 08/19/2013  . Pulmonary infiltrates 07/31/2013  . HCAP (healthcare-associated pneumonia) 03/23/2013  . Encounter for therapeutic drug monitoring 03/20/2013  . UTI (lower urinary tract infection) 03/13/2013  . CAD (coronary artery disease) 03/13/2013  . Atrial fibrillation, controlled 03/13/2013  . Protein-calorie malnutrition, severe 02/27/2013  . S/P CABG x 3 02/20/2013  . History of embolectomy 09/22/2012  . Renal insufficiency, mild 09/22/2012  . New onset atrial fibrillation 09/15/2012  . Syncope 09/15/2012  . Peripheral vascular disease, unspecified 07/08/2012  . Mitral stenosis 07/27/2010  . HYPERLIPIDEMIA 10/25/2009  . HYPERTENSION 10/25/2009  . NEPHROLITHIASIS, HX OF 10/25/2009  . SLEEP DISORDER 11/29/2008  . SNORING 10/14/2008  . FATIGUE 10/05/2008  . APNEA 10/05/2008  . SKIN CANCER, HX OF 10/05/2008  . GASTROENTERITIS 05/06/2008  . FASTING HYPERGLYCEMIA 05/06/2008  . RESTLESS LEG SYNDROME 09/11/2007  . Allergic Rhinitis, Cause Unspecified 09/11/2007  . GOUT 03/15/2006  . ANEMIA-NOS 03/15/2006  . ASTHMA 03/15/2006  . COPD 03/15/2006  . OSTEOPOROSIS 03/15/2006  . COLONIC POLYPS, HX OF 03/15/2006    Medications Prior to Visit Current Outpatient Prescriptions on File Prior to Visit  Medication Sig Dispense Refill  . aspirin EC 81 MG tablet Take 81 mg by mouth daily.      . busPIRone (BUSPAR) 15 MG tablet TAKE 1/2 TABLET 2 TIMES A DAY  90 tablet  0  . CARTIA XT 240 MG 24 hr capsule  TAKE ONE CAPSULE BY MOUTH DAILY  30 capsule  5  . feeding supplement, ENSURE COMPLETE, (ENSURE COMPLETE) LIQD Take 237 mLs by mouth 2 (two) times daily between meals.  60 Bottle  0  . ketotifen (ZADITOR) 0.025 % ophthalmic solution Place 1 drop into both eyes daily.       . Lidocaine HCl 4 % SOLN Apply 4 mLs topically as needed (for migraines).       . metoprolol tartrate (LOPRESSOR) 25 MG tablet TAKE ONE TABLET BY MOUTH TWICE A DAY  60 tablet  5  . nitroGLYCERIN (NITROSTAT) 0.4 MG SL tablet Place 0.4 mg under the tongue every 5 (five) minutes as needed for chest pain.      . polyethylene glycol (MIRALAX / GLYCOLAX) packet Take 17 g by mouth daily.  14 each  0  . pravastatin (PRAVACHOL) 40 MG tablet TAKE ONE (1) TABLET EACH DAY  90 tablet  3  . ranitidine (ZANTAC) 150 MG tablet TAKE ONE TABLET TWICE DAILY  180 tablet  3  . topiramate (TOPAMAX) 25 MG tablet Take 75 mg by mouth daily.      . traMADol (ULTRAM) 50 MG tablet Take 1-2 tablets (50-100 mg total) by mouth every 6 (six) hours as needed for moderate pain.  30 tablet  0  . XARELTO 20 MG TABS tablet TAKE ONE TABLET BY MOUTH DAILY WITH SUPPER  30 tablet  6   No current facility-administered medications on file prior to visit.    Current Medications (verified) Current Outpatient Prescriptions  Medication Sig Dispense Refill  .  aspirin EC 81 MG tablet Take 81 mg by mouth daily.      . busPIRone (BUSPAR) 15 MG tablet TAKE 1/2 TABLET 2 TIMES A DAY  90 tablet  0  . CARTIA XT 240 MG 24 hr capsule TAKE ONE CAPSULE BY MOUTH DAILY  30 capsule  5  . feeding supplement, ENSURE COMPLETE, (ENSURE COMPLETE) LIQD Take 237 mLs by mouth 2 (two) times daily between meals.  60 Bottle  0  . ketotifen (ZADITOR) 0.025 % ophthalmic solution Place 1 drop into both eyes daily.       . Lidocaine HCl 4 % SOLN Apply 4 mLs topically as needed (for migraines).       . metoprolol tartrate (LOPRESSOR) 25 MG tablet TAKE ONE TABLET BY MOUTH TWICE A DAY  60 tablet  5  .  nitroGLYCERIN (NITROSTAT) 0.4 MG SL tablet Place 0.4 mg under the tongue every 5 (five) minutes as needed for chest pain.      . polyethylene glycol (MIRALAX / GLYCOLAX) packet Take 17 g by mouth daily.  14 each  0  . pravastatin (PRAVACHOL) 40 MG tablet TAKE ONE (1) TABLET EACH DAY  90 tablet  3  . ranitidine (ZANTAC) 150 MG tablet TAKE ONE TABLET TWICE DAILY  180 tablet  3  . topiramate (TOPAMAX) 25 MG tablet Take 75 mg by mouth daily.      . traMADol (ULTRAM) 50 MG tablet Take 1-2 tablets (50-100 mg total) by mouth every 6 (six) hours as needed for moderate pain.  30 tablet  0  . XARELTO 20 MG TABS tablet TAKE ONE TABLET BY MOUTH DAILY WITH SUPPER  30 tablet  6   No current facility-administered medications for this visit.     Allergies (verified) Ivp dye   PAST HISTORY  Family History Family History  Problem Relation Age of Onset  . Allergies Mother   . Coronary artery disease Mother   . Allergy (severe) Mother   . Allergies Father   . Stroke Father   . Heart attack Father 46  . Asthma Father   . Allergy (severe) Father   . Allergies Sister   . Allergy (severe) Sister   . Allergies Brother     x2  . Allergy (severe) Brother   . Diabetes Brother   . Allergy (severe) Brother   . Emphysema Brother     smoked  . Cancer Son     Social History History  Substance Use Topics  . Smoking status: Former Smoker -- 3.00 packs/day for 15 years    Types: Cigarettes    Quit date: 02/19/1961  . Smokeless tobacco: Never Used  . Alcohol Use: No    Are there smokers in your home (other than you)?  No  Risk Factors Current exercise habits: Home exercise routine includes walking walks 20 minutes 7 days a week hrs per week.  Dietary issues discussed: eats a well balanced diet   Cardiac risk factors: advanced age (older than 57 for men, 74 for women), dyslipidemia and hypertension.  Depression Screen (Note: if answer to either of the following is "Yes", a more complete  depression screening is indicated)   Q1: Over the past two weeks, have you felt down, depressed or hopeless? No  Q2: Over the past two weeks, have you felt little interest or pleasure in doing things? No  Have you lost interest or pleasure in daily life? No  Do you often feel hopeless? No  Do you cry easily  over simple problems? No  Activities of Daily Living In your present state of health, do you have any difficulty performing the following activities?:  Driving? Yes Managing money?  No Feeding yourself? No Getting from bed to chair? No Climbing a flight of stairs? No Preparing food and eating?: No Bathing or showering? No Getting dressed: No Getting to the toilet? No Using the toilet:No Moving around from place to place: No In the past year have you fallen or had a near fall?:Yes   Are you sexually active?  No  Do you have more than one partner?  No  Hearing Difficulties: Yes Do you often ask people to speak up or repeat themselves? Yes Do you experience ringing or noises in your ears? Yes Do you have difficulty understanding soft or whispered voices? Yes   Do you feel that you have a problem with memory? No  Do you often misplace items? No  Do you feel safe at home?  Yes  Cognitive Testing  Alert? Yes  Normal Appearance?Yes  Oriented to person? Yes  Place? Yes   Time? Yes  Recall of three objects?  yes  Can perform simple calculations? No  Displays appropriate judgment?Yes  Can read the correct time from a watch face?Yes   Advanced Directives have been discussed with the patient? Yes   List the Names of Other Physician/Practitioners you currently use: 1.  Patient Care Team: Hendricks Limes, MD as PCP - General   Indicate any recent Medical Services you may have received from other than [facility] providers in the past year (date may be approximate).  Immunization History  Administered Date(s) Administered  . Influenza Split 11/28/2010, 11/13/2011  . Influenza  Whole 12/03/2006, 11/12/2007, 11/25/2008, 12/09/2009  . Influenza, High Dose Seasonal PF 11/20/2012  . Influenza,inj,Quad PF,36+ Mos 11/10/2013  . Pneumococcal Conjugate-13 09/16/2013  . Pneumococcal Polysaccharide-23 05/20/2006    Screening Tests Health Maintenance  Topic Date Due  . Colonoscopy  05/26/1982  . Influenza Vaccine  09/19/2013  . Zostavax  03/23/2014 (Originally 05/25/1992)  . Tetanus/tdap  03/23/2014 (Originally 05/26/1949)  . Pneumococcal Polysaccharide Vaccine Age 2 And Over  Completed    All answers were reviewed with the patient and necessary referrals were made:  Carmelina Paddock, NP   11/10/2013   History reviewed: allergies, current medications, past family history, past medical history, past social history and past surgical history  Review of Systems not indicated today    Objective:     Vision by Snellen chart: right DGL:OVFIEPP is due for eye exam, left IRJ:JOACZ glasses due for eye exam Blood pressure 130/62, pulse 62, temperature 97.4 F (36.3 C), temperature source Oral, height 6' (1.829 m), weight 146 lb 6.4 oz (66.407 kg), SpO2 98.00%. Body mass index is 19.85 kg/(m^2).  No exam performed today, not indicated.     Assessment:     Patient presents for yearly preventative medicine examination. Medicare questionnaire was completed  All immunizations and health maintenance protocols were reviewed with the patient and needed orders were placed.  Appropriate screening laboratory values were ordered for the patient including screening of hyperlipidemia, renal function and hepatic function. If indicated by BPH, a PSA was ordered.  Medication reconciliation,  past medical history, social history, problem list and allergies were reviewed in detail with the patient  Goals were established with regard to weight loss, exercise, and  diet in compliance with medications  End of life planning was discussed.       Plan:  During the course of the  visit the patient was educated and counseled about appropriate screening and preventive services including:    Influenza vaccine  Nutrition counseling   Advanced directives: has NO advanced directive  - add't info requested. Referral to SW: yes  Diet review for nutrition referral? Yes ____  Not Indicated __x__   Patient Instructions (the written plan) was given to the patient.  Medicare Attestation I have personally reviewed: The patient's medical and social history Their use of alcohol, tobacco or illicit drugs Their current medications and supplements The patient's functional ability including ADLs,fall risks, home safety risks, cognitive, and hearing and visual impairment Diet and physical activities Evidence for depression or mood disorders  The patient's weight, height, BMI, and visual acuity have been recorded in the chart.  I have made referrals, counseling, and provided education to the patient based on review of the above and I have provided the patient with a written personalized care plan for preventive services.   Follow up with Opthalmologist and Audiologist patient has previously been seen and  Wife will make appointment  For patient with established physicians Continue 2 cans of ensure a day to supplement meals.  Continue walking daily Follow up with Dr. Linna Darner as scheduled.  Influenza vaccine given today.  Wife primary care giver and support.  Advanced directive info given Call clinic with questions and concerns.   Steva Colder A, NP   11/10/2013

## 2013-11-27 ENCOUNTER — Other Ambulatory Visit: Payer: Self-pay | Admitting: Internal Medicine

## 2014-01-28 ENCOUNTER — Encounter (HOSPITAL_COMMUNITY): Payer: Self-pay | Admitting: Cardiovascular Disease

## 2014-02-26 ENCOUNTER — Other Ambulatory Visit: Payer: Self-pay | Admitting: Internal Medicine

## 2014-03-08 ENCOUNTER — Telehealth: Payer: Self-pay | Admitting: Internal Medicine

## 2014-03-08 NOTE — Telephone Encounter (Signed)
New problem   Pt need prior auth for medication Xarelto 20mg ..please advise.

## 2014-03-09 ENCOUNTER — Other Ambulatory Visit: Payer: Self-pay | Admitting: Internal Medicine

## 2014-03-09 NOTE — Telephone Encounter (Signed)
Pt was notified by [facility] that [place] may no longer be covered unless Dr. Harrington Challenger requests authorization. Pt is not currently out of medication.

## 2014-03-09 NOTE — Telephone Encounter (Signed)
OK X 3 mos 

## 2014-03-19 ENCOUNTER — Telehealth: Payer: Self-pay | Admitting: Internal Medicine

## 2014-03-19 NOTE — Telephone Encounter (Signed)
New Msg         Pt wife calling, about prior auth for Xarelto.    Please return call to [place] at home.

## 2014-03-22 NOTE — Telephone Encounter (Signed)
Samples placed at front desk for patient xarelto 20 mg. Prior auth not completed as of this date

## 2014-03-29 NOTE — Telephone Encounter (Signed)
Prior auth pending for xarelto 20 mg daily. [facility] will fax final decision with in 72 hours. Ref # is [id number]

## 2014-04-07 NOTE — Progress Notes (Signed)
Cardiology Office Note   Date:  04/08/2014   ID:  Robert Richard, DOB 1930/03/22, MRN [id number]  PCP:  Unice Cobble, MD  Cardiologist:   Dorris Carnes, MD   Patient comes in today for continued evaluation of CP and CAD     History of Present Illness: Robert Richard is a 81 y.o. male with a history of CAD,  s/p CABG x 3 (LIMA to LAD; SVG to OM1; SVG to PDA) and AVR (#23 mm [facility] Ease pericardial valve. Post op course long/complicated. 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 I saw him back in Aug 2015 Patient denies CP  No dizziness  Breathing  OK  Rides stationaly bike    No palpitations       Current Outpatient Prescriptions  Medication Sig Dispense Refill  . aspirin EC 81 MG tablet Take 81 mg by mouth daily.    . busPIRone (BUSPAR) 15 MG tablet TAKE 1/2 TABLET TWICE DAILY 90 tablet 0  . CARTIA XT 240 MG 24 hr capsule TAKE ONE CAPSULE BY MOUTH DAILY 30 capsule 5  . feeding supplement, ENSURE COMPLETE, (ENSURE COMPLETE) LIQD Take 237 mLs by mouth 2 (two) times daily between meals. 60 Bottle 0  . ketotifen (ZADITOR) 0.025 % ophthalmic solution Place 1 drop into both eyes daily.     . Lidocaine HCl 4 % SOLN Apply 4 mLs topically as needed (for migraines).     . metoprolol tartrate (LOPRESSOR) 25 MG tablet TAKE ONE TABLET BY MOUTH TWICE A DAY 60 tablet 5  . Multiple Vitamins-Minerals (DAILY MULTIVITAMIN) CAPS Take 1 capsule by mouth daily.     . nitroGLYCERIN (NITROSTAT) 0.4 MG SL tablet Place 0.4 mg under the tongue every 5 (five) minutes as needed for chest pain.    . polyethylene glycol (MIRALAX / GLYCOLAX) packet Take 17 g by mouth daily. 14 each 0  . pravastatin (PRAVACHOL) 40 MG tablet TAKE ONE (1) TABLET EACH DAY 90 tablet 3  . ranitidine (ZANTAC) 150 MG tablet TAKE ONE TABLET TWICE DAILY 180 tablet 3  . topiramate (TOPAMAX) 25 MG  tablet Take 75 mg by mouth daily.    . traMADol (ULTRAM) 50 MG tablet Take 1-2 tablets (50-100 mg total) by mouth every 6 (six) hours as needed for moderate pain. 30 tablet 0  . XARELTO 20 MG TABS tablet TAKE ONE TABLET ONCE DAILY WITH SUPPER 30 tablet 6   No current facility-administered medications for this visit.    Allergies:   Ivp dye   Past Medical History  Diagnosis Date  . Anemia   . Colon polyp   . COPD (chronic obstructive pulmonary disease)     Astmatic bronchitis  . Asthma with bronchitis   . Gout     PMH  . Osteoporosis   . Allergic rhinitis   . Skin cancer, basal cell     Dr Nevada Crane, PMH  . Nephrolithiasis   . HLD (hyperlipidemia)   . Skin cancer (melanoma) 2012    Right neck  . Arthritis     SHOULDER  . Headache(784.0)   . RLS (restless legs syndrome)   . Dysrhythmia 08/2012    NEW ONSET ATRIAL FIBRILATION    Past Surgical History  Procedure Laterality Date  . Finger surgery      for foreign body  . Colonoscopy w/ polypectomy  Dr Deatra Ina  . Inguinal hernia repair    . Nose surgery      Septal Deviation  . Neck surgery  12/2010    Melanoma ; [facility]   . Leg surgery  06/2010     Dr.Lawson, for blood clott. Left leg   . Tee without cardioversion N/A 01/23/2013    Procedure: TRANSESOPHAGEAL ECHOCARDIOGRAM (TEE);  Surgeon: Jolaine Artist, MD;  Location: [facility] ENDOSCOPY;  Service: Cardiovascular;  Laterality: N/A;  sign heart cath consent w/tee consent  . Coronary artery bypass graft N/A 02/20/2013    Procedure: CORONARY ARTERY BYPASS GRAFTING (CABG);  Surgeon: Ivin Poot, MD;  Location: [place];  Service: Open Heart Surgery;  Laterality: N/A;  . Aortic valve replacement N/A 02/20/2013    Procedure: AORTIC VALVE REPLACEMENT (AVR);  Surgeon: Ivin Poot, MD;  Location: [facility];  Service: Open Heart Surgery;  Laterality: N/A;  . Intraoperative transesophageal echocardiogram N/A 02/20/2013    Procedure: INTRAOPERATIVE TRANSESOPHAGEAL ECHOCARDIOGRAM;   Surgeon: Ivin Poot, MD;  Location: [place];  Service: Open Heart Surgery;  Laterality: N/A;  . Left and right heart catheterization with coronary angiogram N/A 01/05/2013    Procedure: LEFT AND RIGHT HEART CATHETERIZATION WITH CORONARY ANGIOGRAM;  Surgeon: Blane Ohara, MD;  Location[facility] CATH LAB;  Service: Cardiovascular;  Laterality: N/A;  . Right heart catheterization N/A 01/23/2013    Procedure: RIGHT HEART CATH;  Surgeon: Jolaine Artist, MD;  Location: [facility] CATH LAB;  Service: Cardiovascular;  Laterality: N/A;     Social History:  The patient  reports that he quit smoking about 53 years ago. His smoking use included Cigarettes. He has a 45 pack-year smoking history. He has never used smokeless tobacco. He reports that he does not drink alcohol or use illicit drugs.   Family History:  The patient's family history includes Allergies in his brother, father, mother, and sister; Allergy (severe) in his brother, brother, father, mother, and sister; Asthma in his father; Cancer in his son; Coronary artery disease in his mother; Diabetes in his brother; Emphysema in his brother; Heart attack (age of onset: 65) in his father; Stroke in his father.    ROS:  Please see the history of present illness. All other systems are reviewed and  Negative to the above problem except as noted.    PHYSICAL EXAM: VS:  BP 118/66 mmHg  Pulse 76  Ht 6' (1.829 m)  Wt 148 lb 3.2 oz (67.223 kg)  BMI 20.10 kg/m2  GEN: Well nourished, well developed, in no acute distress HEENT: normal Neck: no JVD, carotid bruits, or masses Cardiac: RRR; no murmurs, rubs, or gallops,no edema  Respiratory:  clear to auscultation bilaterally, normal work of breathing GI: soft, nontender, nondistended, + BS  No hepatomegaly  MS: no deformity Moving all extremities   Skin: warm and dry, no rash Neuro:  Strength and sensation are intact Psych: euthymic mood, full affect   EKG:  EKG is ordered today.  Atrial flutter  76 bpm   Septal infarct   Lipid Panel    Component Value Date/Time   CHOL 153 04/20/2013 0838   TRIG 122.0 04/20/2013 0838   HDL 35.00* 04/20/2013 0838   CHOLHDL 4 04/20/2013 0838   VLDL 24.4 04/20/2013 0838   LDLCALC 94 04/20/2013 0838      Wt Readings from Last 3 Encounters:  04/08/14 148 lb 3.2 oz (67.223 kg)  11/10/13 146 lb 6.4 oz (66.407 kg)  10/19/13 145 lb (65.772  kg)      ASSESSMENT AND PLAN: 1  CAD  Doing well  No symptoms of angina  2.  S/p AVR  Pericardial valve  No murmurs  3.  Atrial flutter (L sided ) / Afib  Asymptoamtic  Would continue Xarelto   Doing well  I do not think related to #2 (not valve related)  4.  HL  Check lpids   Continue statin     Current medicines are reviewed at length with the patient today.  The patient does not have concerns regarding medicines.  The following changes have been made: No Changes    Labs/ tests ordered today include:  BMET, CBC, Lipid, TSH No orders of the defined types were placed in this encounter.     Disposition:   FU with  in  November  Signed, Chriss Redel, MD  04/08/2014 9:37 AM    [address], [place], [place]  [address] Phone: [phone]; Fax: [phone]

## 2014-04-08 ENCOUNTER — Ambulatory Visit (INDEPENDENT_AMBULATORY_CARE_PROVIDER_SITE_OTHER): Payer: Medicare Other | Admitting: Internal Medicine

## 2014-04-08 ENCOUNTER — Encounter: Payer: Self-pay | Admitting: Internal Medicine

## 2014-04-08 VITALS — BP 118/66 | HR 76 | Ht 72.0 in | Wt 148.2 lb

## 2014-04-08 DIAGNOSIS — I10 Essential (primary) hypertension: Secondary | ICD-10-CM | POA: Diagnosis not present

## 2014-04-08 DIAGNOSIS — I4891 Unspecified atrial fibrillation: Secondary | ICD-10-CM | POA: Diagnosis not present

## 2014-04-08 DIAGNOSIS — E785 Hyperlipidemia, unspecified: Secondary | ICD-10-CM | POA: Diagnosis not present

## 2014-04-08 LAB — BASIC METABOLIC PANEL
BUN: 25 mg/dL — ABNORMAL HIGH (ref 6–23)
CO2: 25 mEq/L (ref 19–32)
Calcium: 9.9 mg/dL (ref 8.4–10.5)
Chloride: 110 mEq/L (ref 96–112)
Creatinine, Ser: 1.48 mg/dL (ref 0.40–1.50)
GFR: 48.38 mL/min — ABNORMAL LOW (ref >60.00)
Glucose, Bld: 77 mg/dL (ref 70–99)
Potassium: 4.5 mEq/L (ref 3.5–5.1)
Sodium: 140 mEq/L (ref 135–145)

## 2014-04-08 LAB — LIPID PANEL
Cholesterol: 117 mg/dL (ref 0–200)
HDL: 30 mg/dL — ABNORMAL LOW (ref >39.00)
LDL Cholesterol: 67 mg/dL (ref 0–99)
NonHDL: 87
Total CHOL/HDL Ratio: 4
Triglycerides: 100 mg/dL (ref 0.0–149.0)
VLDL: 20 mg/dL (ref 0.0–40.0)

## 2014-04-08 LAB — CBC
HCT: 37.2 % — ABNORMAL LOW (ref 39.0–52.0)
Hemoglobin: 12.6 g/dL — ABNORMAL LOW (ref 13.0–17.0)
MCHC: 33.7 g/dL (ref 30.0–36.0)
MCV: 84 fl (ref 78.0–100.0)
Platelets: 206 10*3/uL (ref 150.0–400.0)
RBC: 4.43 Mil/uL (ref 4.22–5.81)
RDW: 14.1 % (ref 11.5–15.5)
WBC: 8.7 10*3/uL (ref 4.0–10.5)

## 2014-04-08 LAB — TSH: TSH: 3.17 u[IU]/mL (ref 0.35–4.50)

## 2014-04-08 NOTE — Telephone Encounter (Signed)
Spoke with patient today at clinic appointment. He received letter that authorization is complete for xarelto. He is approved for 12 months.

## 2014-04-08 NOTE — Patient Instructions (Signed)
Your physician recommends that you continue on your current medications as directed. Please refer to the Current Medication list given to you today. Your physician recommends that you return for lab work in: TODAY (CBC, BMET, TSH, LIPIDS) Your physician wants you to follow-up in: November 2016 [place].  You will receive a reminder letter in the mail two months in advance. If you don't receive a letter, please call our office to schedule the follow-up appointment.

## 2014-04-20 ENCOUNTER — Ambulatory Visit: Payer: Self-pay | Admitting: Internal Medicine

## 2014-04-20 DIAGNOSIS — Z5181 Encounter for therapeutic drug level monitoring: Secondary | ICD-10-CM

## 2014-04-26 DIAGNOSIS — G43719 Chronic migraine without aura, intractable, without status migrainosus: Secondary | ICD-10-CM | POA: Diagnosis not present

## 2014-04-26 DIAGNOSIS — Z79899 Other long term (current) drug therapy: Secondary | ICD-10-CM | POA: Diagnosis not present

## 2014-04-26 DIAGNOSIS — R51 Headache: Secondary | ICD-10-CM | POA: Diagnosis not present

## 2014-05-07 ENCOUNTER — Other Ambulatory Visit: Payer: Self-pay | Admitting: Internal Medicine

## 2014-05-14 ENCOUNTER — Other Ambulatory Visit: Payer: Self-pay | Admitting: Internal Medicine

## 2014-06-07 ENCOUNTER — Other Ambulatory Visit: Payer: Self-pay | Admitting: Internal Medicine

## 2014-06-08 ENCOUNTER — Other Ambulatory Visit: Payer: Self-pay

## 2014-06-08 NOTE — Telephone Encounter (Signed)
Received approval from Medicare D for Xarelto 20 mg tabs as directed, thru 03/30/2015.

## 2014-06-10 ENCOUNTER — Encounter: Payer: Self-pay | Admitting: Internal Medicine

## 2014-06-10 ENCOUNTER — Ambulatory Visit (INDEPENDENT_AMBULATORY_CARE_PROVIDER_SITE_OTHER): Payer: Medicare Other | Admitting: Internal Medicine

## 2014-06-10 VITALS — BP 120/70 | HR 65 | Temp 97.5°F | Resp 16 | Ht 72.0 in | Wt 149.8 lb

## 2014-06-10 DIAGNOSIS — J3089 Other allergic rhinitis: Secondary | ICD-10-CM

## 2014-06-10 DIAGNOSIS — J209 Acute bronchitis, unspecified: Secondary | ICD-10-CM

## 2014-06-10 MED ORDER — AZITHROMYCIN 250 MG PO TABS: ORAL_TABLET | ORAL | Status: DC

## 2014-06-10 NOTE — Progress Notes (Signed)
Pre visit review using our clinic review tool, if applicable. No additional management support is needed unless otherwise documented below in the visit note. 

## 2014-06-10 NOTE — Progress Notes (Signed)
   Subjective:    Patient ID: Robert Richard, male    DOB: 31-May-1930, 81 y.o.   MRN: [id number]  HPI  He's had a sore throat and cough for a week. This is in the context of postnasal drainage. He has head congestion as well as fatigue. The cough is now productive of yellow sputum in the morning. He also has extrinsic symptoms of itchy, watery eyes. A low-grade fever with chills and sweats have been present. He has exertional dyspnea but this is chronic.  He's had limited benefit with Zyrtec, Mucinex, and Alka-Seltzer.    Review of Systems He denies frontal headache, facial pain, nasal purulence, otic pain, otic discharge, or dental pain. The dyspnea is not associated with significant wheezing or dyspnea at rest. He has no paroxysmal nocturnal dyspnea.    Objective:   Physical Exam  General appearance:Thin but adequately nourished; no acute distress or increased work of breathing is present.    Lymphatic: No  lymphadenopathy about the head, neck, or axilla .  Eyes: No conjunctival inflammation or lid edema is present. There is no scleral icterus.  Ears:  External ear exam shows no significant lesions or deformities.  Otoscopic examination reveals clear canals, tympanic membranes are intact bilaterally without bulging, retraction, inflammation or discharge.  Nose:  External nasal examination shows no deformity or inflammation. Nasal mucosa are slightly erythematous without lesions or exudates No septal dislocation or deviation.No obstruction to airflow.   Oral exam: Dental hygiene is good; lips and gums are healthy appearing.There is a punctate white patch in the right oropharynx. Voice is raspy .  Neck:  No deformities, thyromegaly, masses, or tenderness noted.     Heart:  Normal rate and regular rhythm. S1 normal; S2 accentuated without gallop, click, rub or other extra sounds. Raspy grade 3/8-7 systolic murmur  Lungs:Chest clear to auscultation; no wheezes, rhonchi,rales ,or rubs  present.  Extremities:  No cyanosis, edema, or clubbing  noted   Accentuated curvature of the upper thoracic spine   Skin: Warm & dry w/o tenting or jaundice. No significant lesions or rash.       Assessment & Plan:  #1 acute bronchitis w/o bronchospasm #2 allergic rhinitis Plan: See orders and recommendations

## 2014-06-10 NOTE — Patient Instructions (Signed)
Gargle with saline twice a day. Zicam Melts or Zinc lozenges as per package label for sore throat .    Plain Mucinex (NOT D) for thick secretions ;force NON dairy fluids .   Nasal cleansing in the shower as discussed with lather of mild shampoo.After 10 seconds wash off lather while  exhaling through nostrils. Make sure that all residual soap is removed to prevent irritation.  Flonase OR Nasacort AQ 1 spray in each nostril twice a day as needed. Use the "crossover" technique into opposite nostril spraying toward opposite ear @ 45 degree angle, not straight up into nostril.  Plain Allegra (NOT D )  160 daily , Loratidine 10 mg , OR Zyrtec 10 mg @ bedtime  as needed for itchy eyes & sneezing.

## 2014-07-02 DIAGNOSIS — C434 Malignant melanoma of scalp and neck: Secondary | ICD-10-CM | POA: Diagnosis not present

## 2014-07-05 DIAGNOSIS — C434 Malignant melanoma of scalp and neck: Secondary | ICD-10-CM | POA: Insufficient documentation

## 2014-07-25 ENCOUNTER — Other Ambulatory Visit: Payer: Self-pay | Admitting: Internal Medicine

## 2014-08-03 ENCOUNTER — Other Ambulatory Visit (INDEPENDENT_AMBULATORY_CARE_PROVIDER_SITE_OTHER): Payer: Medicare Other

## 2014-08-03 ENCOUNTER — Ambulatory Visit (INDEPENDENT_AMBULATORY_CARE_PROVIDER_SITE_OTHER): Payer: Medicare Other | Admitting: Internal Medicine

## 2014-08-03 ENCOUNTER — Encounter: Payer: Self-pay | Admitting: Internal Medicine

## 2014-08-03 VITALS — BP 120/70 | HR 69 | Temp 97.6°F | Resp 18 | Wt 145.0 lb

## 2014-08-03 DIAGNOSIS — R319 Hematuria, unspecified: Secondary | ICD-10-CM

## 2014-08-03 DIAGNOSIS — R5383 Other fatigue: Secondary | ICD-10-CM | POA: Diagnosis not present

## 2014-08-03 LAB — TSH: TSH: 2.91 u[IU]/mL (ref 0.35–4.50)

## 2014-08-03 LAB — URINALYSIS, ROUTINE W REFLEX MICROSCOPIC
Bilirubin Urine: NEGATIVE
Ketones, ur: NEGATIVE
Nitrite: NEGATIVE
Specific Gravity, Urine: 1.025 (ref 1.000–1.030)
Urine Glucose: NEGATIVE
Urobilinogen, UA: 1 (ref 0.0–1.0)
pH: 6.5 (ref 5.0–8.0)

## 2014-08-03 LAB — CBC WITH DIFFERENTIAL/PLATELET
Basophils Absolute: 0.1 10*3/uL (ref 0.0–0.1)
Basophils Relative: 0.6 % (ref 0.0–3.0)
Eosinophils Absolute: 0.1 10*3/uL (ref 0.0–0.7)
Eosinophils Relative: 1.2 % (ref 0.0–5.0)
HCT: 36 % — ABNORMAL LOW (ref 39.0–52.0)
Hemoglobin: 11.8 g/dL — ABNORMAL LOW (ref 13.0–17.0)
Lymphocytes Relative: 22.8 % (ref 12.0–46.0)
Lymphs Abs: 2 10*3/uL (ref 0.7–4.0)
MCHC: 32.8 g/dL (ref 30.0–36.0)
MCV: 86.3 fl (ref 78.0–100.0)
Monocytes Absolute: 1.2 10*3/uL — ABNORMAL HIGH (ref 0.1–1.0)
Monocytes Relative: 13.5 % — ABNORMAL HIGH (ref 3.0–12.0)
Neutro Abs: 5.4 10*3/uL (ref 1.4–7.7)
Neutrophils Relative %: 61.9 % (ref 43.0–77.0)
Platelets: 204 10*3/uL (ref 150.0–400.0)
RBC: 4.17 Mil/uL — ABNORMAL LOW (ref 4.22–5.81)
RDW: 14.1 % (ref 11.5–15.5)
WBC: 8.8 10*3/uL (ref 4.0–10.5)

## 2014-08-03 NOTE — Progress Notes (Signed)
   Subjective:    Patient ID: Robert Richard, male    DOB: 1930-06-25, 81 y.o.   MRN: [id number]  HPI  Since Saturday 6/11 he's had frank hematuria each time he has urinated. This is painless. The hematuria has improved significantly today. Other than the hematuria and nocturia, he has no other genitourinary symptoms or bleeding dyscrasia.  Significantly he is on a baby aspirin as well as Xarelto following embolectomy in the context of atrial fibrillation. He has had three-vessel bypass as well as aortic valve replacement.  He has a history of multiple renal calculi. These resolved after dietary changes.  Review of Systems  Anorexia is a chronic issue as is malaise since he had his heart surgery. His TSH was 3.17 on 04/08/14. Dysuria, pyuria, frequency,  or polyuria are denied. Epistaxis, hemoptysis, melena, or rectal bleeding denied. No unexplained weight loss, significant dyspepsia,dysphagia, or abdominal pain.  There is no abnormal bruising , bleeding, or difficulty stopping bleeding with injury.      Objective:   Physical Exam  Pertinent or positive findings include: He has a mustache and some pattern alopecia. He is thin and appears somewhat chronically ill. There is splitting of the first heart sound and increase in the second heart sound. Ventral hernias present. He has DIP osteoarthritic changes.   General appearance :adequately nourished; in no distress.  Eyes: No conjunctival inflammation or scleral icterus is present.  Oral exam:  Lips and gums are healthy appearing.There is no oropharyngeal erythema or exudate noted. Dental hygiene is good.  Heart:  Normal rate and regular rhythm. S1 and S2 normal without gallop, murmur, click, rub or other extra sounds    Lungs:Chest clear to auscultation; no wheezes, rhonchi,rales ,or rubs present.No increased work of breathing.   Abdomen: bowel sounds normal, soft and non-tender without masses, organomegaly or hernias noted.  No  guarding or rebound.   Genitourinary: Genitalia normal except for  varices in left scrotum..  Vascular : all pulses equal ; no bruits present.  Skin:Warm & dry.  Intact without suspicious lesions or rashes ; no tenting or jaundice   Lymphatic: No lymphadenopathy is noted about the head, neck, axilla, or inguinal areas.   Neuro: Strength, tone decreased        Assessment & Plan:  #1 hematuria in the context of Xarelto and baby aspirin  #2 history of renal calculi    #3 status post aortic valve replacement  Plan: See orders and recommendations

## 2014-08-03 NOTE — Patient Instructions (Signed)
STOP baby Aspirin.  Your next office appointment will be determined based upon review of your pending labs  and  xrays  Those written interpretation of the lab results and instructions will be transmitted to you by mail for your records.  Critical results will be called.   Followup as needed for any active or acute issue. Please report any significant change in your symptoms.

## 2014-08-03 NOTE — Progress Notes (Signed)
Pre visit review using our clinic review tool, if applicable. No additional management support is needed unless otherwise documented below in the visit note. 

## 2014-08-05 ENCOUNTER — Other Ambulatory Visit: Payer: Self-pay | Admitting: Internal Medicine

## 2014-08-05 ENCOUNTER — Encounter: Payer: Self-pay | Admitting: Internal Medicine

## 2014-08-05 DIAGNOSIS — R319 Hematuria, unspecified: Secondary | ICD-10-CM

## 2014-08-05 LAB — URINE CULTURE
Colony Count: NO GROWTH
Organism ID, Bacteria: NO GROWTH

## 2014-08-26 ENCOUNTER — Other Ambulatory Visit: Payer: Self-pay | Admitting: Internal Medicine

## 2014-08-27 ENCOUNTER — Encounter: Payer: Self-pay | Admitting: Internal Medicine

## 2014-09-05 ENCOUNTER — Other Ambulatory Visit: Payer: Self-pay | Admitting: Internal Medicine

## 2014-09-06 ENCOUNTER — Other Ambulatory Visit: Payer: Self-pay | Admitting: Emergency Medicine

## 2014-09-06 MED ORDER — BUSPIRONE HCL 15 MG PO TABS: 7.5000 mg | ORAL_TABLET | Freq: Two times a day (BID) | ORAL | Status: DC

## 2014-09-20 DIAGNOSIS — Z961 Presence of intraocular lens: Secondary | ICD-10-CM | POA: Diagnosis not present

## 2014-09-20 DIAGNOSIS — H43813 Vitreous degeneration, bilateral: Secondary | ICD-10-CM | POA: Diagnosis not present

## 2014-09-20 DIAGNOSIS — H5022 Vertical strabismus, left eye: Secondary | ICD-10-CM | POA: Diagnosis not present

## 2014-10-01 IMAGING — CR DG CHEST 1V PORT
1 series · 1 of 1 positions shown · non-contrast
Comparison: 03/02/2013.

CLINICAL DATA: CABG.

EXAM:
PORTABLE CHEST - 1 VIEW

[AP]
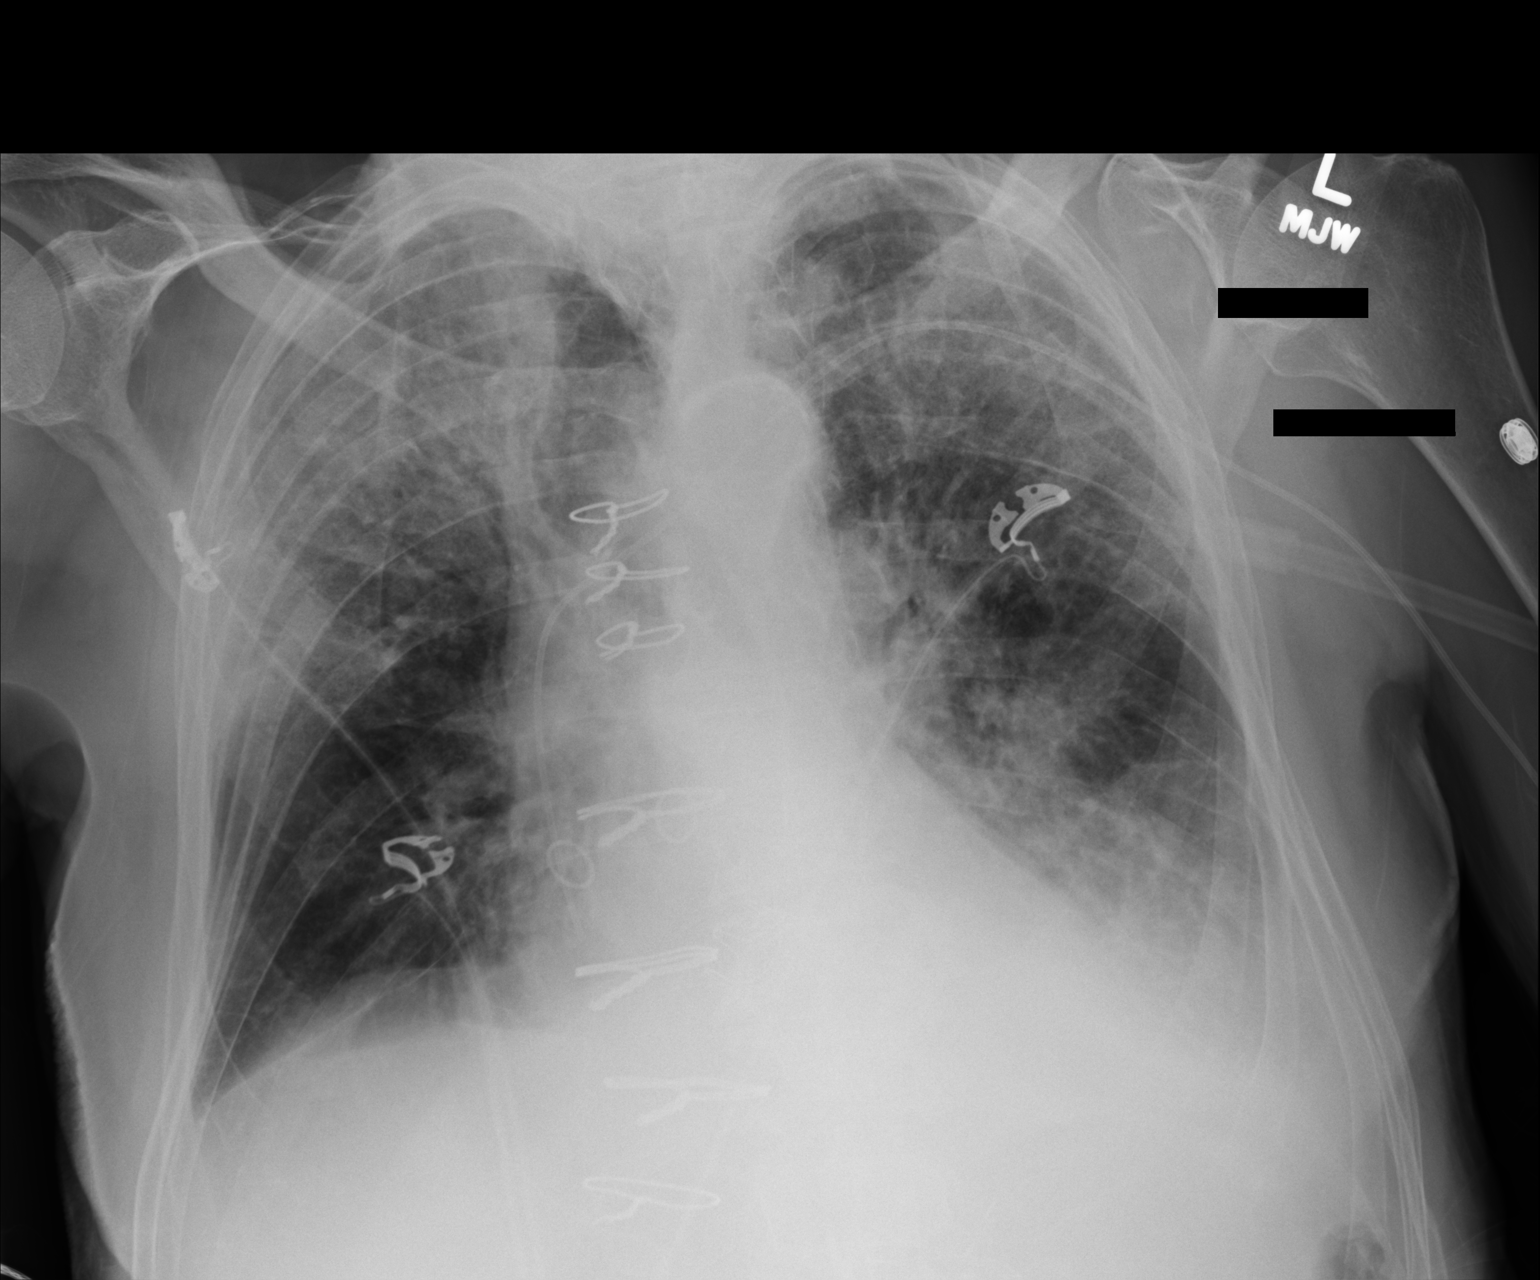

[1 of 1 positions shown; findings below may reference images not displayed]

FINDINGS: Central line noted in stable position. PICC line noted in stable
position. Stable cardiomegaly. Prior CABG . Persistent bilateral
upper lobe and left lower lobe infiltrates are present. Left-sided
pleural effusion is present. No pneumothorax.
IMPRESSION: Persistent bilateral upper lobe and left lower lobe infiltrates.

## 2014-10-04 ENCOUNTER — Other Ambulatory Visit: Payer: Self-pay | Admitting: Internal Medicine

## 2014-10-04 IMAGING — CR DG CHEST 2V
2 series · 2 of 2 positions shown · non-contrast
Comparison: 03/05/2013

CLINICAL DATA: Postop CABG.

EXAM:
CHEST  2 VIEW

[w chest pa]
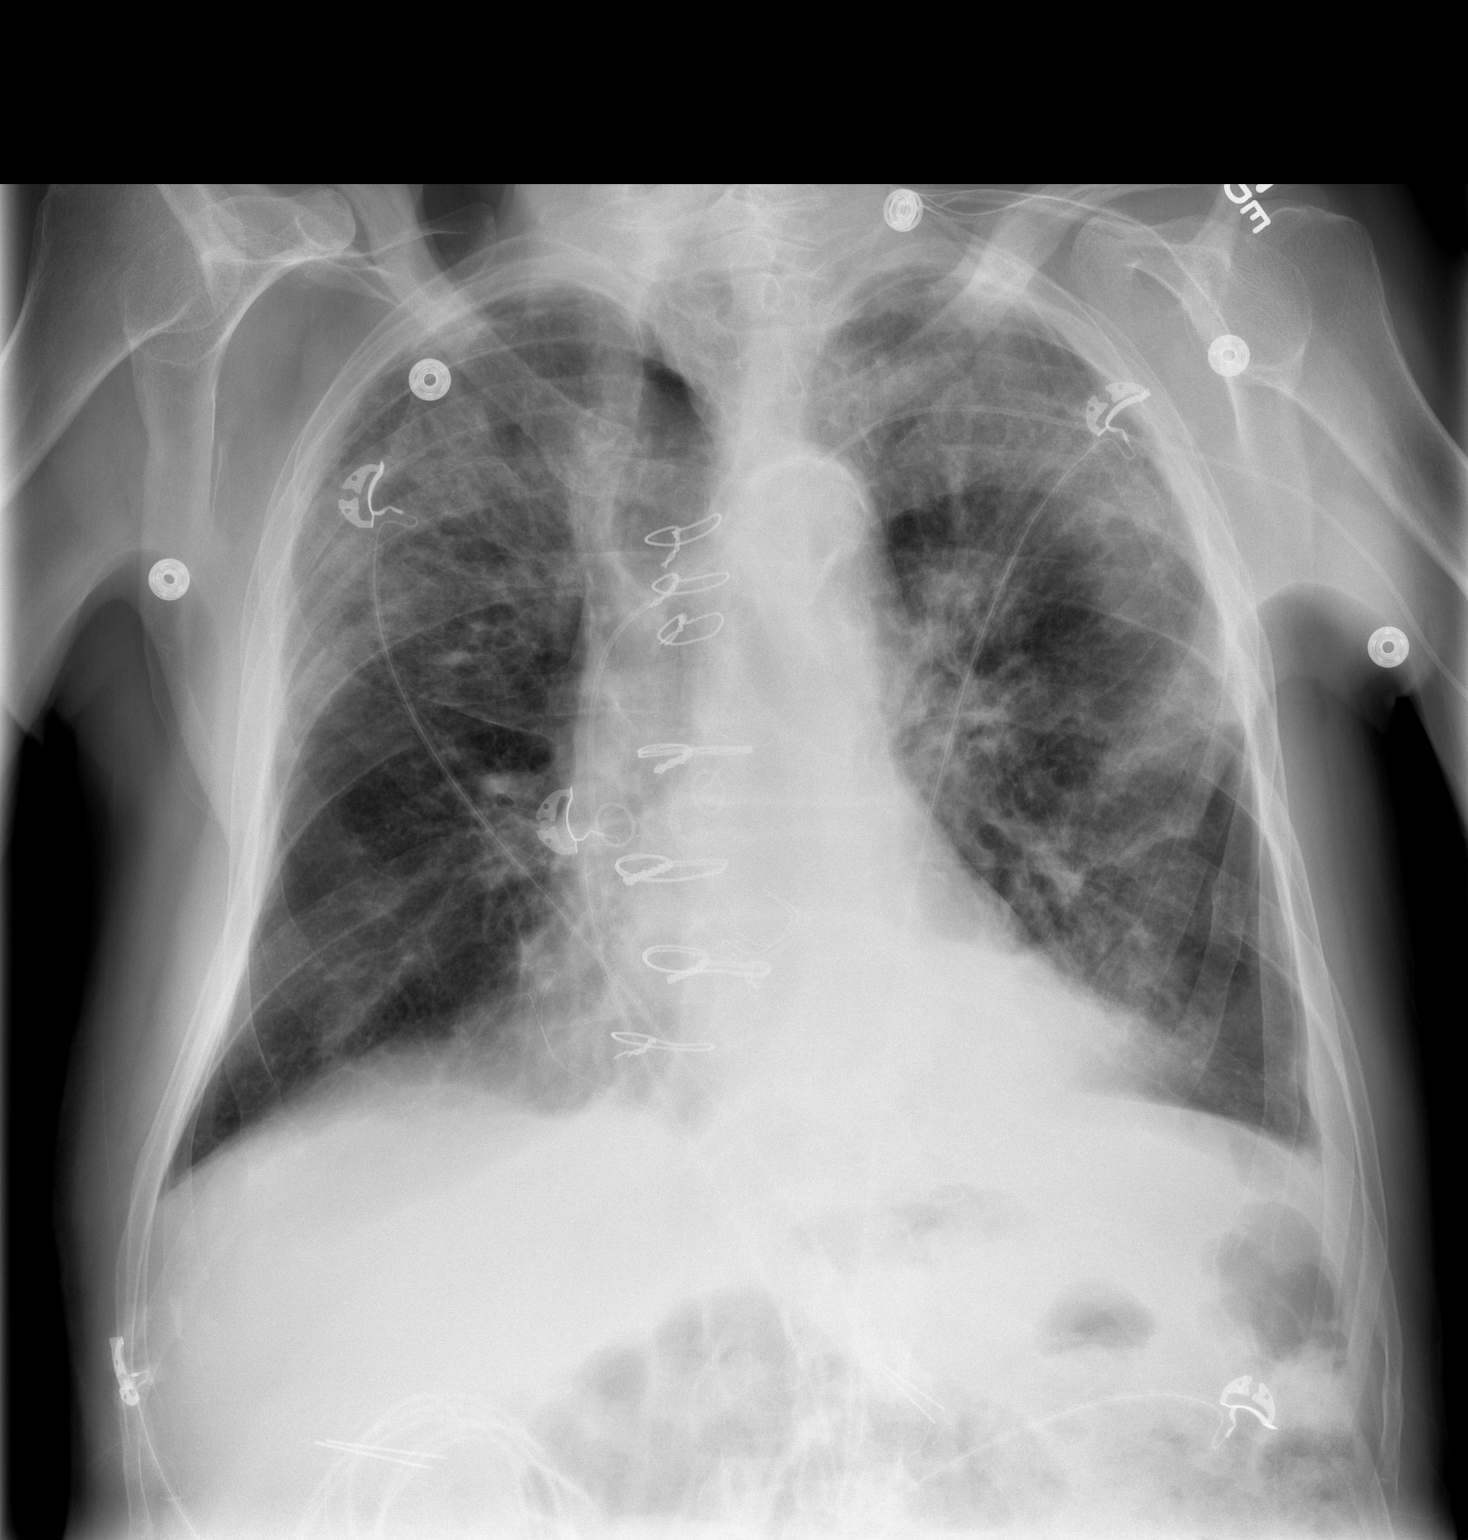

[w chest lat]
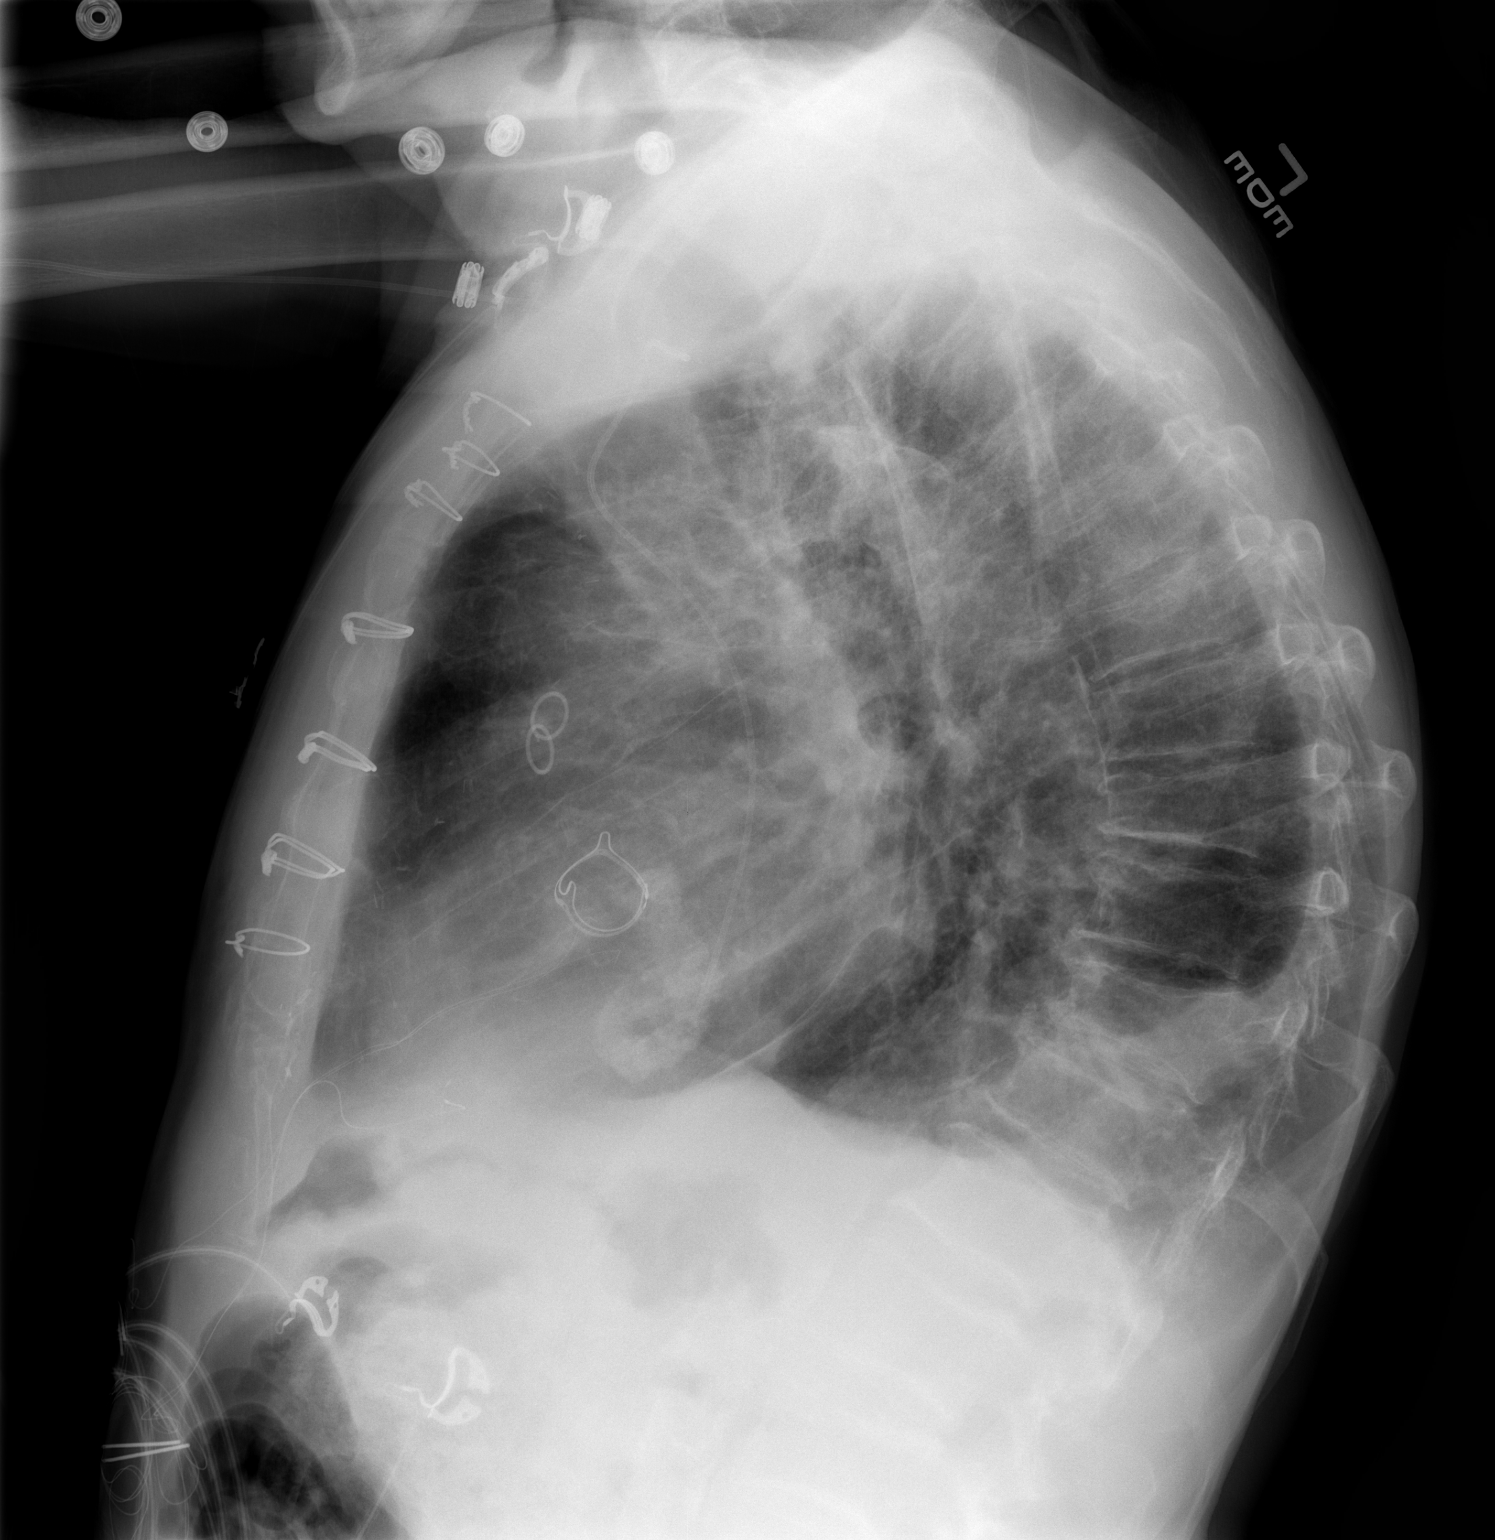

[2 of 2 positions shown; findings below may reference images not displayed]

FINDINGS: Left arm PICC tip at the cavoatrial junction. Aortic valve
replacement. CABG.

Improved aeration in the lung apices with decreased infiltrate or
edema bilaterally. Mild improvement in left lower lobe atelectasis
and small left effusion.
IMPRESSION: Improved aeration in the lung apices bilaterally. Improvement in
left lower lobe atelectasis.

## 2014-10-06 DIAGNOSIS — R310 Gross hematuria: Secondary | ICD-10-CM | POA: Diagnosis not present

## 2014-10-06 DIAGNOSIS — N200 Calculus of kidney: Secondary | ICD-10-CM | POA: Diagnosis not present

## 2014-10-11 IMAGING — CT CT HEAD W/O CM
1 series · 16 of 30 positions shown, 20 images · non-contrast
Comparison: CT head without contrast 03/02/2013.

CLINICAL DATA: Altered mental status. Recent cardiac bypass
surgery. The patient was discharged to the [HOSPITAL] yesterday.

EXAM:
CT HEAD WITHOUT CONTRAST
TECHNIQUE: Contiguous axial images were obtained from the base of the skull
through the vertex without intravenous contrast.

[Series 2: head 5.0 h30s · axial · 0.43mm/px · z∈[-170,-30]mm · 16 of 32 slices shown, 20 images]
[im 2/32  brain]
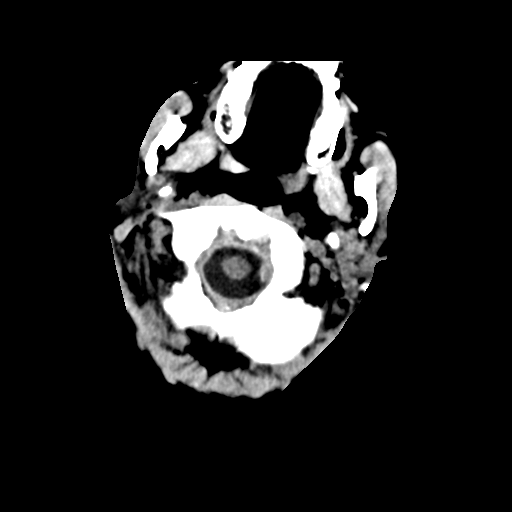
[im 2/32  bone]
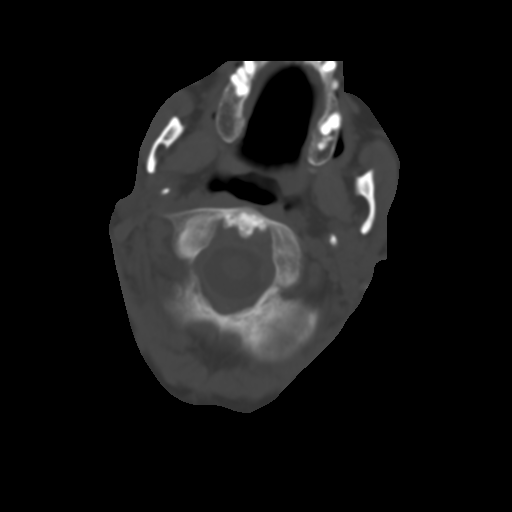
[im 4/32  brain]
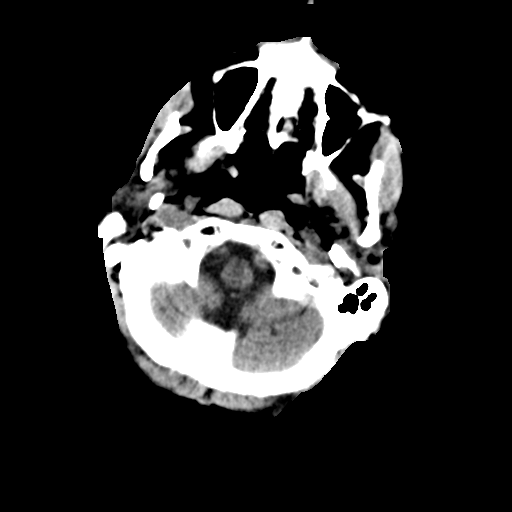
[im 6/32  brain]
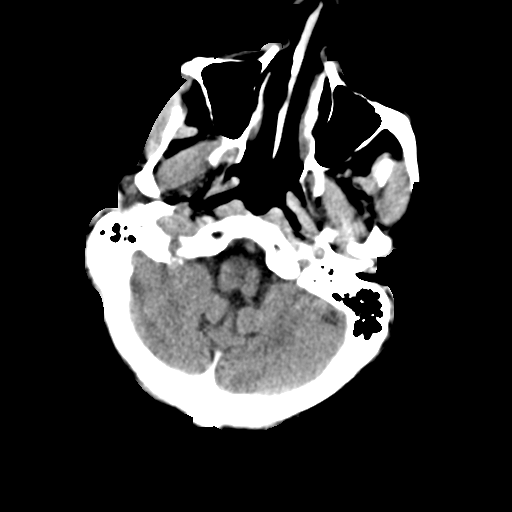
[im 8/32  brain]
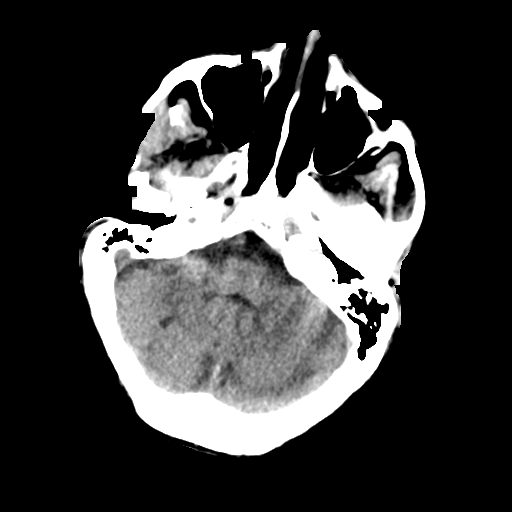
[im 9/32  brain]
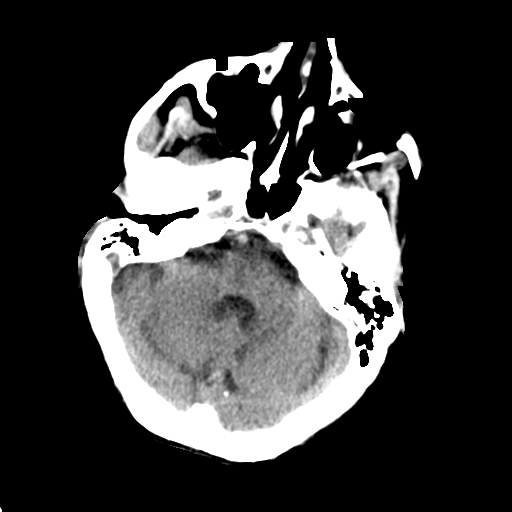
[im 9/32  bone]
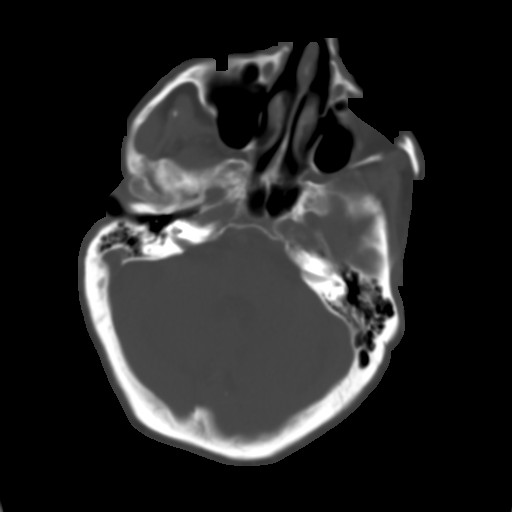
[im 11/32  brain]
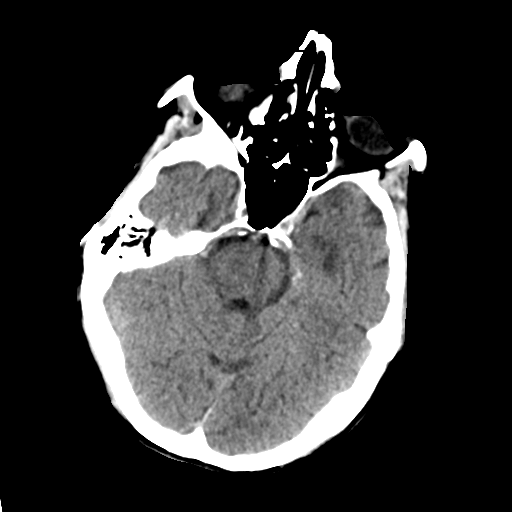
[im 13/32  brain]
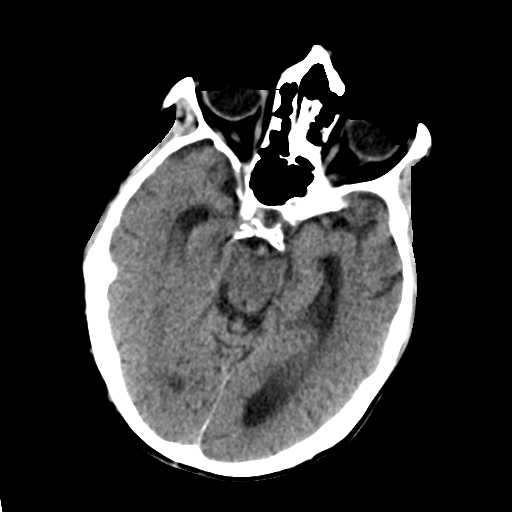
[im 15/32  brain]
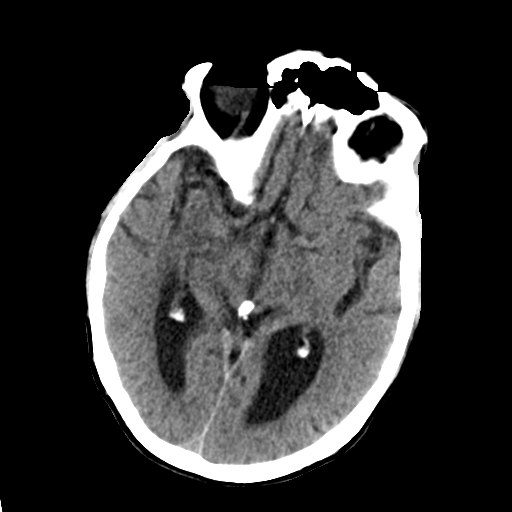
[im 17/32  brain]
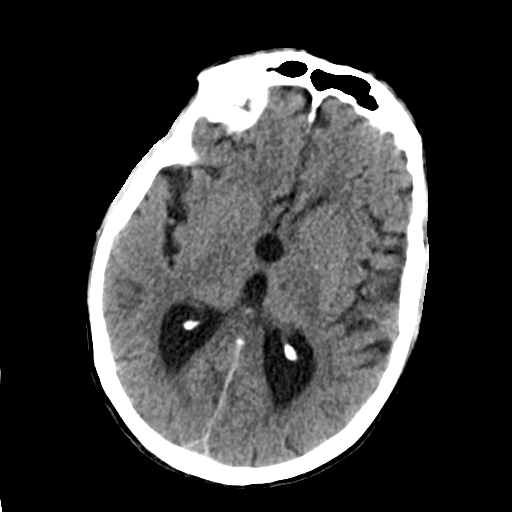
[im 17/32  bone]
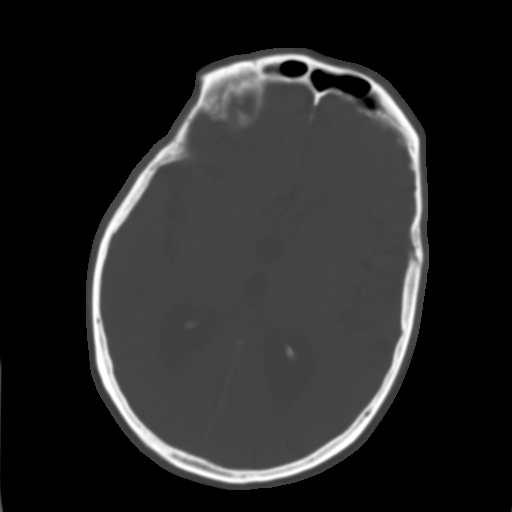
[im 19/32  brain]
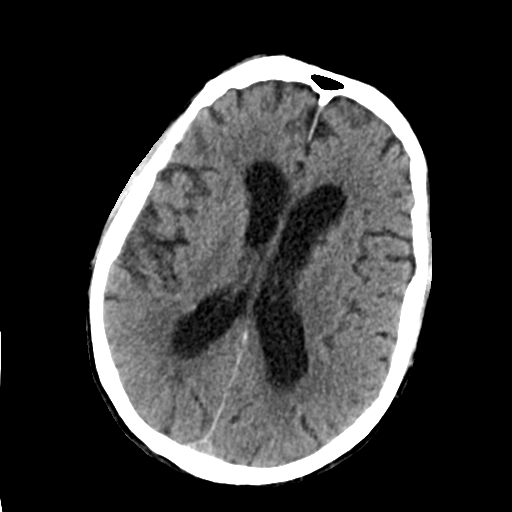
[im 21/32  brain]
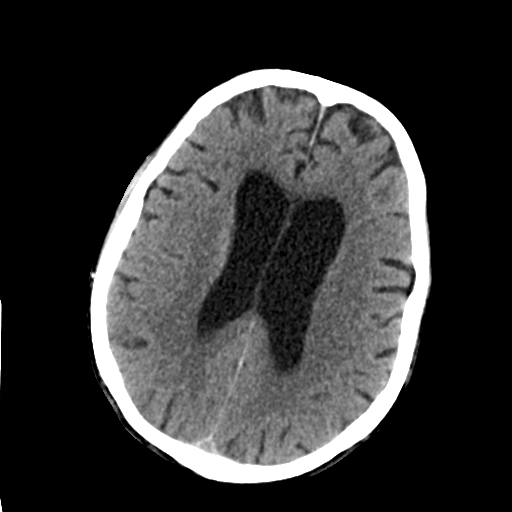
[im 23/32  brain]
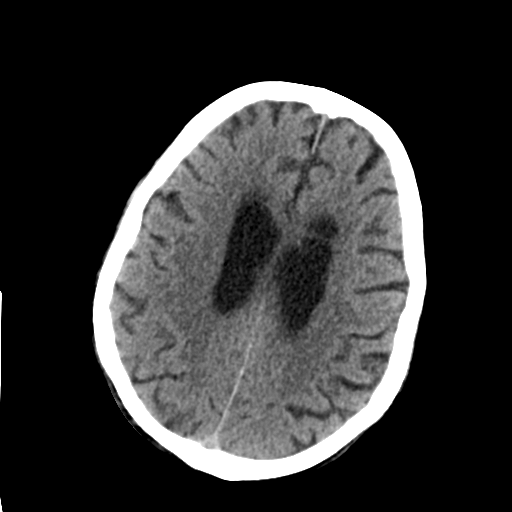
[im 24/32  brain]
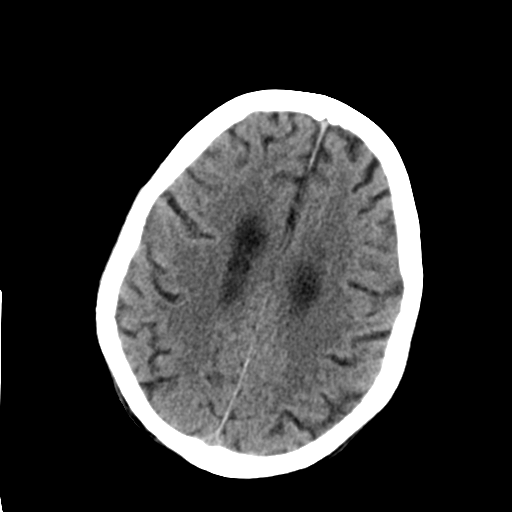
[im 24/32  bone]
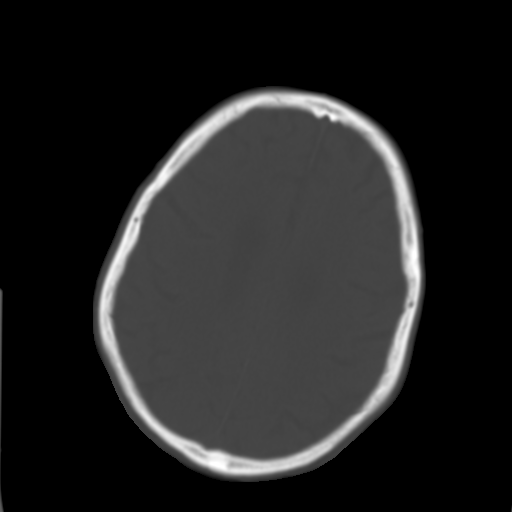
[im 26/32  brain]
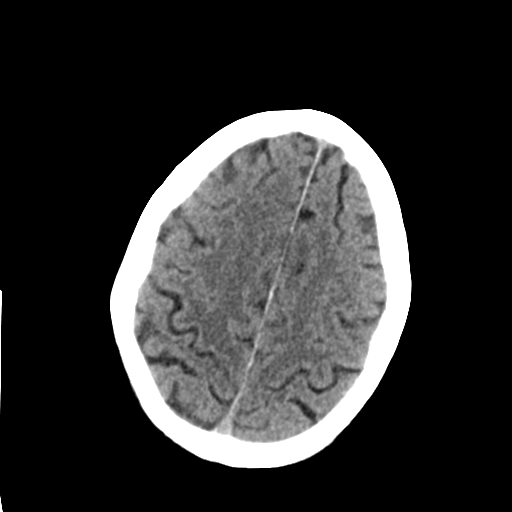
[im 28/32  brain]
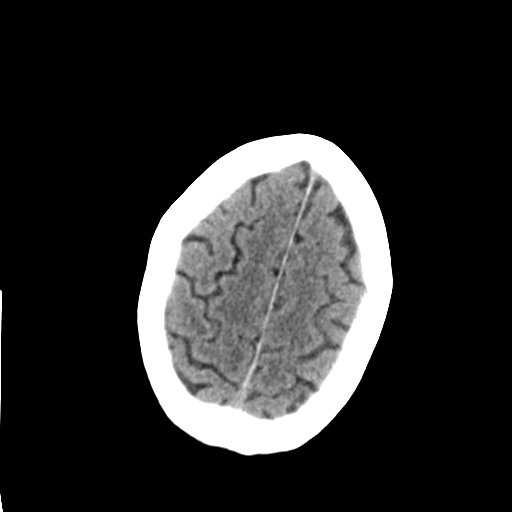
[im 30/32  brain]
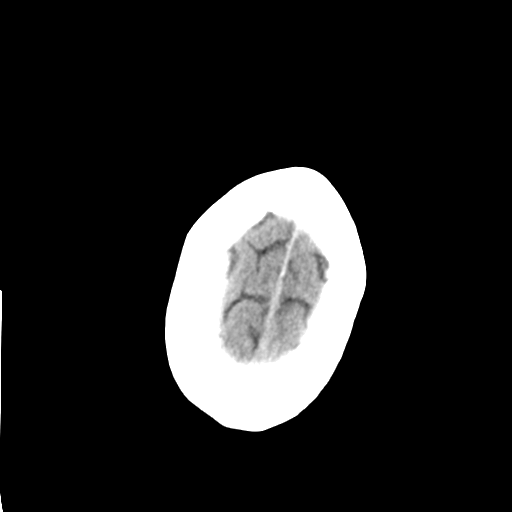

[16 of 30 positions shown; findings below may reference images not displayed]

FINDINGS: Remote lacunar infarcts are again noted within the basal ganglia and
scratch the remote lacunar infarcts are again noted within the
cerebellum. No acute cortical infarct, hemorrhage, or mass lesion is
present. The ventricles are proportionate to the degree of atrophy
and stable. No significant extra-axial fluid collection is present.

The paranasal sinuses and mastoid air cells are clear. The osseous
skull is intact.
IMPRESSION: 1. No acute intracranial abnormality or significant interval change.
2. Stable remote lacunar infarcts of the cerebellum.

## 2014-11-02 DIAGNOSIS — G43719 Chronic migraine without aura, intractable, without status migrainosus: Secondary | ICD-10-CM | POA: Diagnosis not present

## 2014-11-19 ENCOUNTER — Ambulatory Visit (INDEPENDENT_AMBULATORY_CARE_PROVIDER_SITE_OTHER): Payer: Medicare Other

## 2014-11-19 DIAGNOSIS — Z23 Encounter for immunization: Secondary | ICD-10-CM | POA: Diagnosis not present

## 2014-12-02 ENCOUNTER — Other Ambulatory Visit: Payer: Self-pay | Admitting: Internal Medicine

## 2014-12-07 ENCOUNTER — Other Ambulatory Visit (INDEPENDENT_AMBULATORY_CARE_PROVIDER_SITE_OTHER): Payer: Medicare Other

## 2014-12-07 ENCOUNTER — Encounter: Payer: Self-pay | Admitting: Internal Medicine

## 2014-12-07 ENCOUNTER — Ambulatory Visit (INDEPENDENT_AMBULATORY_CARE_PROVIDER_SITE_OTHER): Payer: Medicare Other | Admitting: Internal Medicine

## 2014-12-07 VITALS — BP 98/54 | HR 60 | Temp 98.0°F

## 2014-12-07 DIAGNOSIS — J209 Acute bronchitis, unspecified: Secondary | ICD-10-CM | POA: Diagnosis not present

## 2014-12-07 DIAGNOSIS — I9589 Other hypotension: Secondary | ICD-10-CM

## 2014-12-07 DIAGNOSIS — R413 Other amnesia: Secondary | ICD-10-CM

## 2014-12-07 DIAGNOSIS — R5383 Other fatigue: Secondary | ICD-10-CM

## 2014-12-07 DIAGNOSIS — R001 Bradycardia, unspecified: Secondary | ICD-10-CM

## 2014-12-07 LAB — HEPATIC FUNCTION PANEL
ALT: 11 U/L (ref 0–53)
AST: 16 U/L (ref 0–37)
Albumin: 4 g/dL (ref 3.5–5.2)
Alkaline Phosphatase: 87 U/L (ref 39–117)
Bilirubin, Direct: 0.1 mg/dL (ref 0.0–0.3)
Total Bilirubin: 0.4 mg/dL (ref 0.2–1.2)
Total Protein: 7.9 g/dL (ref 6.0–8.3)

## 2014-12-07 LAB — CBC WITH DIFFERENTIAL/PLATELET
Basophils Absolute: 0 10*3/uL (ref 0.0–0.1)
Basophils Relative: 0.4 % (ref 0.0–3.0)
Eosinophils Absolute: 0.3 10*3/uL (ref 0.0–0.7)
Eosinophils Relative: 2.7 % (ref 0.0–5.0)
HCT: 36.3 % — ABNORMAL LOW (ref 39.0–52.0)
Hemoglobin: 12.1 g/dL — ABNORMAL LOW (ref 13.0–17.0)
Lymphocytes Relative: 21.8 % (ref 12.0–46.0)
Lymphs Abs: 2.1 10*3/uL (ref 0.7–4.0)
MCHC: 33.2 g/dL (ref 30.0–36.0)
MCV: 86.9 fl (ref 78.0–100.0)
Monocytes Absolute: 1.3 10*3/uL — ABNORMAL HIGH (ref 0.1–1.0)
Monocytes Relative: 13.6 % — ABNORMAL HIGH (ref 3.0–12.0)
Neutro Abs: 6 10*3/uL (ref 1.4–7.7)
Neutrophils Relative %: 61.5 % (ref 43.0–77.0)
Platelets: 181 10*3/uL (ref 150.0–400.0)
RBC: 4.18 Mil/uL — ABNORMAL LOW (ref 4.22–5.81)
RDW: 13.9 % (ref 11.5–15.5)
WBC: 9.8 10*3/uL (ref 4.0–10.5)

## 2014-12-07 LAB — BASIC METABOLIC PANEL
BUN: 21 mg/dL (ref 6–23)
CO2: 25 mEq/L (ref 19–32)
Calcium: 9.5 mg/dL (ref 8.4–10.5)
Chloride: 109 mEq/L (ref 96–112)
Creatinine, Ser: 1.5 mg/dL (ref 0.40–1.50)
GFR: 47.56 mL/min — ABNORMAL LOW (ref >60.00)
Glucose, Bld: 104 mg/dL — ABNORMAL HIGH (ref 70–99)
Potassium: 4.8 mEq/L (ref 3.5–5.1)
Sodium: 140 mEq/L (ref 135–145)

## 2014-12-07 LAB — TSH: TSH: 2.15 u[IU]/mL (ref 0.35–4.50)

## 2014-12-07 LAB — VITAMIN B12: Vitamin B-12: 584 pg/mL (ref 211–911)

## 2014-12-07 MED ORDER — AMOXICILLIN-POT CLAVULANATE 500-125 MG PO TABS: ORAL_TABLET | ORAL | Status: DC

## 2014-12-07 NOTE — Progress Notes (Signed)
   Subjective:    Patient ID: Robert Richard, male    DOB: 30-Oct-1930, 81 y.o.   MRN: [id number]  HPI He presents with a productive cough for 2 weeks associated with yellow sputum. He also describes profound malaise and lethargy. His wife describes anhedonia and anorexia. She's noted some slurred speech the last 3 weeks. She's noted decreased verbal interaction the last week. There's been some deterioration in his gait the last 2 weeks as well. She denies any falls or head trauma.  He is on a beta blocker as well as a calcium channel blocker. He is also on the blood thinner Xarelto.  His wife believes that he may have had some dark stool. He has no other bleeding dyscrasias.  Review of Systems  Frontal headache, facial pain , nasal purulence, dental pain, sore throat , otic pain or otic discharge denied. No fever , chills or sweats.  Epistaxis, hemoptysis, hematuria,  or rectal bleeding denied. No unexplained weight loss, significant dyspepsia,dysphagia, or abdominal pain.  There is no abnormal bruising , bleeding, or difficulty stopping bleeding with injury.     Objective:   Physical Exam  Pertinent or positive findings include: He is profoundly lethargic with minimal verbal interaction. Tongue is somewhat beefy red. There is accentuated curvature of the thoracic spine. Breath sounds are somewhat distant. Pedal pulses are decreased. His gait is very slow and deliberate; he uses a cane. General appearance : in no distress.  Eyes: No conjunctival inflammation or scleral icterus is present.  Oral exam:  Lips and gums are healthy appearing.There is no oropharyngeal erythema or exudate noted. Dental hygiene is good.  Heart:  Slow rate and regular rhythm. S1 and S2 normal without gallop, murmur, click, rub or other extra sounds    Lungs:Chest clear to auscultation; no wheezes, rhonchi,rales ,or rubs present.No increased work of breathing.   Abdomen: bowel sounds normal, soft and non-tender  without masses, organomegaly or hernias noted.  No guarding or rebound.   Vascular : all pulses equal ; no bruits present.  Skin:Warm & dry.  Intact without suspicious lesions or rashes ; no tenting or jaundice   Lymphatic: No lymphadenopathy is noted about the head, neck, axilla.   Neuro: Strength, tone profoundly decreased. He scored 18 on out of 30 on the Mini-Mental state exam      Assessment & Plan:  #1 acute bronchitis  #2 mental status changes with memory deficit  #3 bradycardia and hypotension in the context of combined beta blocker as well as calcium channel blocker therapy  #4 possible melena  Plan: See orders and recommendations

## 2014-12-07 NOTE — Patient Instructions (Signed)
  Your next office appointment will be determined based upon review of your pending labs  and  xrays  Those written interpretation of the lab results and instructions will be transmitted to you by mail for your records.  Critical results will be called.   Followup as needed for any active or acute issue. Please report any significant change in your symptoms. 

## 2014-12-08 LAB — VDRL: VDRL, Serum: NONREACTIVE

## 2014-12-09 ENCOUNTER — Other Ambulatory Visit: Payer: Self-pay | Admitting: Internal Medicine

## 2014-12-10 ENCOUNTER — Other Ambulatory Visit: Payer: Self-pay | Admitting: Emergency Medicine

## 2014-12-10 MED ORDER — BUSPIRONE HCL 15 MG PO TABS: 7.5000 mg | ORAL_TABLET | Freq: Two times a day (BID) | ORAL | Status: DC

## 2014-12-14 ENCOUNTER — Ambulatory Visit (INDEPENDENT_AMBULATORY_CARE_PROVIDER_SITE_OTHER): Payer: Medicare Other | Admitting: Internal Medicine

## 2014-12-14 ENCOUNTER — Ambulatory Visit: Payer: Medicare Other | Admitting: Internal Medicine

## 2014-12-14 VITALS — BP 122/58 | HR 80 | Temp 98.1°F | Wt 149.0 lb

## 2014-12-14 DIAGNOSIS — I10 Essential (primary) hypertension: Secondary | ICD-10-CM | POA: Diagnosis not present

## 2014-12-14 DIAGNOSIS — Z7901 Long term (current) use of anticoagulants: Secondary | ICD-10-CM

## 2014-12-14 DIAGNOSIS — F05 Delirium due to known physiological condition: Secondary | ICD-10-CM

## 2014-12-14 DIAGNOSIS — Z9181 History of falling: Secondary | ICD-10-CM | POA: Diagnosis not present

## 2014-12-14 DIAGNOSIS — R410 Disorientation, unspecified: Secondary | ICD-10-CM

## 2014-12-14 NOTE — Progress Notes (Signed)
Pre visit review using our clinic review tool, if applicable. No additional management support is needed unless otherwise documented below in the visit note. 

## 2014-12-14 NOTE — Patient Instructions (Signed)

## 2014-12-14 NOTE — Progress Notes (Signed)
   Subjective:    Patient ID: Robert Richard, male    DOB: 1930-07-15, 81 y.o.   MRN: [id number]  HPI His wife states that his mental status has improved. He is more communicative and alert. She believes his gait is more stable. She definitely feels confusion has improved but is still present to some degree.  He continues the broad-spectrum antibiotic. He's been coughing up yellow sputum. He also has extrinsic symptoms of itchy eyes for last 3 days.  They are not monitoring his blood pressure off the metoprolol.  Extensive labs were reviewed. GFR was 47.56. Anemia was present but stable. B12 and RPR were normal.  He has a history of recurrent falling but not recently. He is on a novel anticoagulant,Xarelto.  Review of Systems Chest pain, palpitations, tachycardia, paroxysmal nocturnal dyspnea, claudication or edema are absent. Unexplained weight loss, abdominal pain, significant dyspepsia, dysphagia, melena, rectal bleeding, or persistently small caliber stools are denied. Frontal headache, facial pain , nasal purulence, dental pain, sore throat , otic pain or otic discharge denied. No fever , chills or sweats.    Objective:   Physical Exam Pertinent or positive findings include: He is much more alert today. He was unable to give his name , birthdate, or President Obama's name. He gave the date as Tuesday the 25th. He incorrectly identified his wedding anniversary date. There is accentuated curvature of the thoracic spine. The second heart sound is accentuated. Breath sounds are decreased. He has decreased pedal pulses. Interosseous wasting of the hands is present but strength is surprisingly good.  General appearance :Thin but adequately nourished; in no distress.  Eyes: No conjunctival inflammation or scleral icterus is present.  Oral exam:  Lips and gums are healthy appearing.There is no oropharyngeal erythema or exudate noted. Dental hygiene is good.  Heart:  Normal rate and  regular rhythm. S1 normal without gallop, murmur, click, rub or other extra sounds    Lungs:Chest clear to auscultation; no wheezes, rhonchi,rales ,or rubs present.No increased work of breathing.   Abdomen: bowel sounds normal, soft and non-tender without masses, organomegaly or hernias noted.  No guarding or rebound.   Vascular : all pulses equal ; no bruits present.  Skin:Warm & dry.  Intact without suspicious lesions or rashes ; no tenting or jaundice   Lymphatic: No lymphadenopathy is noted about the head, neck, axilla   Neuro:  tone & DTRs normal.      Assessment & Plan:  #1 hypotension, improved off beta blocker   #2 confusion, improved but still significant  #3 history of recurrent falls  #4 chronic anticoagulation  Plan: See orders and recommendations

## 2014-12-17 ENCOUNTER — Ambulatory Visit (INDEPENDENT_AMBULATORY_CARE_PROVIDER_SITE_OTHER)
Admission: RE | Admit: 2014-12-17 | Discharge: 2014-12-17 | Disposition: A | Discharge status: Home / Self Care | Payer: Medicare Other | Source: Ambulatory Visit | Attending: Internal Medicine | Admitting: Internal Medicine

## 2014-12-17 DIAGNOSIS — R410 Disorientation, unspecified: Secondary | ICD-10-CM

## 2014-12-17 DIAGNOSIS — Z9181 History of falling: Secondary | ICD-10-CM | POA: Diagnosis not present

## 2014-12-17 DIAGNOSIS — Z7901 Long term (current) use of anticoagulants: Secondary | ICD-10-CM

## 2014-12-17 DIAGNOSIS — F05 Delirium due to known physiological condition: Secondary | ICD-10-CM

## 2014-12-17 DIAGNOSIS — R4182 Altered mental status, unspecified: Secondary | ICD-10-CM | POA: Diagnosis not present

## 2014-12-21 DIAGNOSIS — Z1283 Encounter for screening for malignant neoplasm of skin: Secondary | ICD-10-CM | POA: Diagnosis not present

## 2014-12-21 DIAGNOSIS — Z85820 Personal history of malignant melanoma of skin: Secondary | ICD-10-CM | POA: Diagnosis not present

## 2014-12-21 DIAGNOSIS — Z08 Encounter for follow-up examination after completed treatment for malignant neoplasm: Secondary | ICD-10-CM | POA: Diagnosis not present

## 2015-01-04 ENCOUNTER — Encounter: Payer: Self-pay | Admitting: *Deleted

## 2015-01-07 ENCOUNTER — Ambulatory Visit (INDEPENDENT_AMBULATORY_CARE_PROVIDER_SITE_OTHER): Payer: Medicare Other | Admitting: Internal Medicine

## 2015-01-07 ENCOUNTER — Encounter: Payer: Self-pay | Admitting: Internal Medicine

## 2015-01-07 VITALS — BP 122/60 | HR 100 | Ht 72.0 in | Wt 148.8 lb

## 2015-01-07 DIAGNOSIS — I4891 Unspecified atrial fibrillation: Secondary | ICD-10-CM

## 2015-01-07 DIAGNOSIS — R002 Palpitations: Secondary | ICD-10-CM

## 2015-01-07 NOTE — Progress Notes (Signed)
Cardiology Office Note   Date:  01/07/2015   ID:  Robert Richard, DOB Feb 04, 1931, MRN [id number]  PCP:  Robert Cobble, MD  Cardiologist:   Robert Carnes, MD   F/U of CAD     History of Present Illness: Robert Richard is a 81 y.o. male with a history of CAD, s/p CABG x 3 (LIMA to LAD; SVG to OM1; SVG to PDA) and AVR (#23 mm [facility] Ease pericardial valve. Post op course long/complicated. 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 I saw him back in Feb 2016 He remains weak  Denies CP  Breathing is OK      Current Outpatient Prescriptions  Medication Sig Dispense Refill  . amoxicillin-clavulanate (AUGMENTIN) 500-125 MG tablet 1 every 12 hours after a full meal 20 tablet 0  . busPIRone (BUSPAR) 15 MG tablet Take 0.5 tablets (7.5 mg total) by mouth 2 (two) times daily. 90 tablet 0  . diltiazem (CARDIZEM CD) 240 MG 24 hr capsule TAKE ONE CAPSULE BY MOUTH DAILY 30 capsule 11  . ketotifen (ZADITOR) 0.025 % ophthalmic solution Place 1 drop into both eyes daily.     . Lidocaine HCl 4 % SOLN Apply 4 mLs topically as needed (for migraines).     . Multiple Vitamins-Minerals (DAILY MULTIVITAMIN) CAPS Take 1 capsule by mouth daily.     . nitroGLYCERIN (NITROSTAT) 0.4 MG SL tablet Place 0.4 mg under the tongue every 5 (five) minutes as needed for chest pain.    . polyethylene glycol (MIRALAX / GLYCOLAX) packet Take 17 g by mouth daily. 14 each 0  . pravastatin (PRAVACHOL) 40 MG tablet TAKE ONE (1) TABLET EACH DAY 90 tablet 3  . ranitidine (ZANTAC) 150 MG tablet TAKE ONE TABLET TWICE DAILY 180 tablet 3  . topiramate (TOPAMAX) 25 MG tablet Take 75 mg by mouth daily.    Robert Richard 20 MG TABS tablet TAKE ONE (1) TABLET BY MOUTH EVERY DAY WITH SUPPER 30 tablet 11   No current facility-administered medications for this visit.    Allergies:   Ivp dye   Past  Medical History  Diagnosis Date  . Anemia   . Colon polyp   . COPD (chronic obstructive pulmonary disease) (HCC)     Astmatic bronchitis  . Asthma with bronchitis   . Gout     PMH  . Osteoporosis   . Allergic rhinitis   . Skin cancer, basal cell     Dr Robert Richard, PMH  . Nephrolithiasis   . HLD (hyperlipidemia)   . Skin cancer (melanoma) ([place]) 2012    Right neck  . Arthritis     SHOULDER  . Headache(784.0)   . RLS (restless legs syndrome)   . Dysrhythmia 08/2012    NEW ONSET ATRIAL FIBRILATION    Past Surgical History  Procedure Laterality Date  . Finger surgery      for foreign body  . Colonoscopy w/ polypectomy      Dr Deatra Ina  . Inguinal hernia repair    . Nose surgery      Septal Deviation  . Neck surgery  12/2010    Melanoma ; [facility]   . Leg surgery  06/2010     Dr.Lawson, for blood clott. Left leg   . Tee without cardioversion N/A 01/23/2013    Procedure: TRANSESOPHAGEAL ECHOCARDIOGRAM (TEE);  Surgeon: Shaune Pascal  Bensimhon, MD;  Location: [place];  Service: Cardiovascular;  Laterality: N/A;  sign heart cath consent w/tee consent  . Coronary artery bypass graft N/A 02/20/2013    Procedure: CORONARY ARTERY BYPASS GRAFTING (CABG);  Surgeon: Ivin Poot, MD;  Location: [place];  Service: Open Heart Surgery;  Laterality: N/A;  . Aortic valve replacement N/A 02/20/2013    Procedure: AORTIC VALVE REPLACEMENT (AVR);  Surgeon: Ivin Poot, MD;  Location: [place];  Service: Open Heart Surgery;  Laterality: N/A;  . Intraoperative transesophageal echocardiogram N/A 02/20/2013    Procedure: INTRAOPERATIVE TRANSESOPHAGEAL ECHOCARDIOGRAM;  Surgeon: Ivin Poot, MD;  Location: [place];  Service: Open Heart Surgery;  Laterality: N/A;  . Left and right heart catheterization with coronary angiogram N/A 01/05/2013    Procedure: LEFT AND RIGHT HEART CATHETERIZATION WITH CORONARY ANGIOGRAM;  Surgeon: Blane Ohara, MD;  Location: [facility] CATH LAB;  Service: Cardiovascular;   Laterality: N/A;  . Right heart catheterization N/A 01/23/2013    Procedure: RIGHT HEART CATH;  Surgeon: Jolaine Artist, MD;  Location: [facility] CATH LAB;  Service: Cardiovascular;  Laterality: N/A;     Social History:  The patient  reports that he quit smoking about 53 years ago. His smoking use included Cigarettes. He has a 45 pack-year smoking history. He has never used smokeless tobacco. He reports that he does not drink alcohol or use illicit drugs.   Family History:  The patient's family history includes Allergies in his brother, father, mother, and sister; Allergy (severe) in his brother, brother, father, mother, and sister; Asthma in his father; Cancer in his son; Coronary artery disease in his mother; Diabetes in his brother; Emphysema in his brother; Heart attack (age of onset: 79) in his father; Stroke in his father.    ROS:  Please see the history of present illness. All other systems are reviewed and  Negative to the above problem except as noted.    PHYSICAL EXAM: VS:  BP 122/60 mmHg  Pulse 100  Ht 6' (1.829 m)  Wt 67.495 kg (148 lb 12.8 oz)  BMI 20.18 kg/m2  SpO2 99%  GEN: Well nourished, thin 81 yo in no acute distress HEENT: normal Neck: no JVD, carotid bruits, or masses Cardiac:  irreg rate/rhythm  ; no murmurs, rubs, or gallops,Tr edema  Respiratory:  clear to auscultation bilaterally, normal work of breathing GI: soft, nontender, nondistended, + BS  No hepatomegaly  MS: no deformity Moving all extremities   Skin: warm and dry, no rash Neuro:  Strength and sensation are intact Psych: euthymic mood, full affect   EKG:  EKG is not ordered today.   Lipid Panel    Component Value Date/Time   CHOL 117 04/08/2014 1018   TRIG 100.0 04/08/2014 1018   HDL 30.00* 04/08/2014 1018   CHOLHDL 4 04/08/2014 1018   VLDL 20.0 04/08/2014 1018   LDLCALC 67 04/08/2014 1018      Wt Readings from Last 3 Encounters:  01/07/15 67.495 kg (148 lb 12.8 oz)  12/14/14 67.586 kg  (149 lb)  08/03/14 65.772 kg (145 lb)      ASSESSMENT AND PLAN:  1  CAD  I am not convinced current symtpoms of fatigue represent angina    2.  PAF   Pt appears to be in afib on exam  Would get holter monitor  Not sure if rates controlled or if are this explains symtpoms    3.  PVOD  4  HTN  Adequate control  Signed, Robert Carnes, MD  01/07/2015 9:51 AM    [address], [place], [place]  [address] Phone: [phone]; Fax: [phone]

## 2015-01-07 NOTE — Patient Instructions (Signed)
Your physician recommends that you continue on your current medications as directed. Please refer to the Current Medication list given to you today. Your physician has recommended that you wear a holter monitor. Holter monitors are medical devices that record the heart's electrical activity. Doctors most often use these monitors to diagnose arrhythmias. Arrhythmias are problems with the speed or rhythm of the heartbeat. The monitor is a small, portable device. You can wear one while you do your normal daily activities. This is usually used to diagnose what is causing palpitations/syncope (passing out).  Your physician wants you to follow-up in: 4-5 months with Dr. Harrington Challenger.  You will receive a reminder letter in the mail two months in advance. If you don't receive a letter, please call our office to schedule the follow-up appointment.

## 2015-01-10 ENCOUNTER — Ambulatory Visit (INDEPENDENT_AMBULATORY_CARE_PROVIDER_SITE_OTHER): Payer: Medicare Other

## 2015-01-10 DIAGNOSIS — I4891 Unspecified atrial fibrillation: Secondary | ICD-10-CM

## 2015-01-10 DIAGNOSIS — R002 Palpitations: Secondary | ICD-10-CM | POA: Diagnosis not present

## 2015-01-26 ENCOUNTER — Telehealth: Payer: Self-pay | Admitting: *Deleted

## 2015-01-26 MED ORDER — DILTIAZEM HCL ER COATED BEADS 120 MG PO CP24: 120.0000 mg | ORAL_CAPSULE | Freq: Every day | ORAL | Status: DC

## 2015-01-26 NOTE — Telephone Encounter (Signed)
Informed patient's wife. She verbalizes understanding and agreement and will monitor BP and HR. Follow up made for end of January.

## 2015-01-26 NOTE — Telephone Encounter (Signed)
-----   Message from M[place], MD sent at 01/25/2015  3:09 PM EST ----- Pt on 240 mg diltiazem  WOuld increase to 360 mg per day Add as 240 and 120 so that if he doesn't tolerate it he can stop

## 2015-03-09 ENCOUNTER — Other Ambulatory Visit: Payer: Self-pay | Admitting: Internal Medicine

## 2015-03-21 ENCOUNTER — Ambulatory Visit (INDEPENDENT_AMBULATORY_CARE_PROVIDER_SITE_OTHER): Payer: Medicare Other | Admitting: Internal Medicine

## 2015-03-21 VITALS — BP 132/78 | HR 104 | Ht 72.0 in | Wt 147.0 lb

## 2015-03-21 DIAGNOSIS — I4891 Unspecified atrial fibrillation: Secondary | ICD-10-CM | POA: Diagnosis not present

## 2015-03-21 DIAGNOSIS — R000 Tachycardia, unspecified: Secondary | ICD-10-CM

## 2015-03-21 DIAGNOSIS — I10 Essential (primary) hypertension: Secondary | ICD-10-CM | POA: Diagnosis not present

## 2015-03-21 DIAGNOSIS — I2510 Atherosclerotic heart disease of native coronary artery without angina pectoris: Secondary | ICD-10-CM | POA: Diagnosis not present

## 2015-03-21 NOTE — Patient Instructions (Signed)
Your physician recommends that you continue on your current medications as directed. Please refer to the Current Medication list given to you today. Your physician has recommended that you wear a holter monitor. Holter monitors are medical devices that record the heart's electrical activity. Doctors most often use these monitors to diagnose arrhythmias. Arrhythmias are problems with the speed or rhythm of the heartbeat. The monitor is a small, portable device. You can wear one while you do your normal daily activities. This is usually used to diagnose what is causing palpitations/syncope (passing out). PLEASE SCHEDULE FOR EARLY APRIL  Your physician wants you to follow-up in: September, 2017 WITH DR. Harrington Challenger.  You will receive a reminder letter in the mail two months in advance. If you don't receive a letter, please call our office to schedule the follow-up appointment.

## 2015-03-21 NOTE — Progress Notes (Signed)
Cardiology Office Note   Date:  03/21/2015   ID:  KENE WOOLDRIDGE, DOB 22-Nov-1930, MRN [id number]  PCP:  [name], MD  Cardiologist:   Dorris Carnes, MD    F/U of afib and CAD      History of Present Illness: Robert Richard is a 82 y.o. male with a history of  CAD, s/p CABG x 3 (LIMA to LAD; SVG to OM1; SVG to PDA) and AVR (#23 mm [facility] Ease pericardial valve. Post op course long/complicated. 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  I asw him back in November  In afib at time  Holter ordered.  Avg HR 104  I recomm adding metoprolol  He did not tolearte  Incrased dilt to 306   Since I saw him he denies CP  Breathing is OK  Just doesn't have energy  No dizzienss  Has felt likethis since he had surg in 2015  Current Outpatient Prescriptions  Medication Sig Dispense Refill  . amoxicillin-clavulanate (AUGMENTIN) 500-125 MG tablet 1 every 12 hours after a full meal 20 tablet 0  . busPIRone (BUSPAR) 15 MG tablet TAKE ONE-HALF TABLET BY MOUTH TWO TIMES DAILY 90 tablet 0  . diltiazem (CARDIZEM CD) 120 MG 24 hr capsule Take 1 capsule (120 mg total) by mouth daily. Take in addition to 240 mg tablet daily 30 capsule 6  . diltiazem (CARDIZEM CD) 240 MG 24 hr capsule TAKE ONE CAPSULE BY MOUTH DAILY 30 capsule 11  . ketotifen (ZADITOR) 0.025 % ophthalmic solution Place 1 drop into both eyes daily.     . Lidocaine HCl 4 % SOLN Apply 4 mLs topically as needed (for migraines).     . Multiple Vitamins-Minerals (DAILY MULTIVITAMIN) CAPS Take 1 capsule by mouth daily.     . nitroGLYCERIN (NITROSTAT) 0.4 MG SL tablet Place 0.4 mg under the tongue every 5 (five) minutes as needed for chest pain.    . polyethylene glycol (MIRALAX / GLYCOLAX) packet Take 17 g by mouth daily. 14 each 0  . pravastatin (PRAVACHOL) 40 MG tablet TAKE ONE (1) TABLET EACH DAY 90 tablet 3   . ranitidine (ZANTAC) 150 MG tablet TAKE ONE TABLET TWICE DAILY 180 tablet 3  . topiramate (TOPAMAX) 25 MG tablet Take 75 mg by mouth daily.    [name] 20 MG TABS tablet TAKE ONE (1) TABLET BY MOUTH EVERY DAY WITH SUPPER 30 tablet 11   No current facility-administered medications for this visit.    Allergies:   Ivp dye   Past Medical History  Diagnosis Date  . Anemia   . Colon polyp   . COPD (chronic obstructive pulmonary disease) (HCC)     Astmatic bronchitis  . Asthma with bronchitis   . Gout     PMH  . Osteoporosis   . Allergic rhinitis   . Skin cancer, basal cell     Dr [place] Crane, PMH  . Nephrolithiasis   . HLD (hyperlipidemia)   . Skin cancer (melanoma) ([place]) 2012    Right neck  . Arthritis     SHOULDER  . Headache(784.0)   . RLS (restless legs syndrome)   . Dysrhythmia 08/2012    NEW ONSET ATRIAL FIBRILATION    Past Surgical History  Procedure Laterality Date  . Finger surgery      for foreign body  . Colonoscopy  w/ polypectomy      Dr Deatra Ina  . Inguinal hernia repair    . Nose surgery      Septal Deviation  . Neck surgery  12/2010    Melanoma ; [facility]   . Leg surgery  06/2010     Dr.Lawson, for blood clott. Left leg   . Tee without cardioversion N/A 01/23/2013    Procedure: TRANSESOPHAGEAL ECHOCARDIOGRAM (TEE);  Surgeon: Jolaine Artist, MD;  Location: [facility] ENDOSCOPY;  Service: Cardiovascular;  Laterality: N/A;  sign heart cath consent w/tee consent  . Coronary artery bypass graft N/A 02/20/2013    Procedure: CORONARY ARTERY BYPASS GRAFTING (CABG);  Surgeon: Ivin Poot, MD;  Location: [place];  Service: Open Heart Surgery;  Laterality: N/A;  . Aortic valve replacement N/A 02/20/2013    Procedure: AORTIC VALVE REPLACEMENT (AVR);  Surgeon: Ivin Poot, MD;  Location: [place];  Service: Open Heart Surgery;  Laterality: N/A;  . Intraoperative transesophageal echocardiogram N/A 02/20/2013    Procedure: INTRAOPERATIVE TRANSESOPHAGEAL ECHOCARDIOGRAM;   Surgeon: Ivin Poot, MD;  Location: [place];  Service: Open Heart Surgery;  Laterality: N/A;  . Left and right heart catheterization with coronary angiogram N/A 01/05/2013    Procedure: LEFT AND RIGHT HEART CATHETERIZATION WITH CORONARY ANGIOGRAM;  Surgeon: Blane Ohara, MD;  Location: [facility] CATH LAB;  Service: Cardiovascular;  Laterality: N/A;  . Right heart catheterization N/A 01/23/2013    Procedure: RIGHT HEART CATH;  Surgeon: Jolaine Artist, MD;  Location: [facility] CATH LAB;  Service: Cardiovascular;  Laterality: N/A;     Social History:  The patient  reports that he quit smoking about 54 years ago. His smoking use included Cigarettes. He has a 45 pack-year smoking history. He has never used smokeless tobacco. He reports that he does not drink alcohol or use illicit drugs.   Family History:  The patient's family history includes Allergies in his brother, father, mother, and sister; Allergy (severe) in his brother, brother, father, mother, and sister; Asthma in his father; Cancer in his son; Coronary artery disease in his mother; Diabetes in his brother; Emphysema in his brother; Heart attack (age of onset: 73) in his father; Stroke in his father.    ROS:  Please see the history of present illness. All other systems are reviewed and  Negative to the above problem except as noted.    PHYSICAL EXAM: VS:  BP 132/78 mmHg  Pulse 104  Ht 6' (1.829 m)  Wt 147 lb (66.679 kg)  BMI 19.93 kg/m2  GEN: Well nourished, well developed, in no acute distress HEENT: normal Neck: no JVD, carotid bruits, or masses Cardiac: RRR; no murmurs, rubs, or gallops,no edema  Respiratory:  clear to auscultation bilaterally, normal work of breathing GI: soft, nontender, nondistended, + BS  No hepatomegaly  MS: no deformity Moving all extremities   Skin: warm and dry, no rash Neuro:  Strength and sensation are intact Psych: euthymic mood, full affect   EKG:  EKG is ordered today.  Atrial fib 103 bpm   Nonspecific ST T wave chages     Lipid Panel    Component Value Date/Time   CHOL 117 04/08/2014 1018   TRIG 100.0 04/08/2014 1018   HDL 30.00* 04/08/2014 1018   CHOLHDL 4 04/08/2014 1018   VLDL 20.0 04/08/2014 1018   LDLCALC 67 04/08/2014 1018      Wt Readings from Last 3 Encounters:  03/21/15 147 lb (66.679 kg)  01/07/15 148 lb 12.8 oz (  67.495 kg)  12/14/14 149 lb (67.586 kg)      ASSESSMENT AND PLAN: 1.  CAD  No symtoms to sugg angina Keep on current regimen  2.  Atrial fib  Now on higher dose of dilt  Will get holter in a couple months  I am not convinced pt's fatigue is due to afib  May represent some depression and deconditioning  Follow  Keep on anticoag  3.  HTN  Adequae control.   Current medicines are reviewed at length with the patient today.  The patient does not have concerns regarding medicines.  The following changes have been made:   Labs/ tests ordered today include:  Orders Placed This Encounter  Procedures  . Holter monitor - 24 hour  . EKG 12-Lead     Disposition:   FU with me in the fall  Signed, Dorris Carnes, MD  03/21/2015 10:03 AM    [facility] [address], [place], [place]  [address] Phone: [phone]; Fax: [phone]

## 2015-04-04 ENCOUNTER — Telehealth: Payer: Self-pay

## 2015-04-04 NOTE — Telephone Encounter (Signed)
Prior auth for Xarelto 20mg [facility]. 

## 2015-04-06 ENCOUNTER — Telehealth: Payer: Self-pay

## 2015-04-06 DIAGNOSIS — Z951 Presence of aortocoronary bypass graft: Secondary | ICD-10-CM

## 2015-04-06 DIAGNOSIS — Z952 Presence of prosthetic heart valve: Secondary | ICD-10-CM

## 2015-04-06 NOTE — Telephone Encounter (Signed)
Xarelto was denied by [facility]. States "Not FDA supported for atrial fibrillation with an aortic valve" He has been on this several years, not sure how he has been getting it. Wife advised. I have left 2 weeks worth of samples for the patient.

## 2015-04-06 NOTE — Telephone Encounter (Signed)
He will need to transition to coumadin SHould also have f/u echo.  Hx CABG, AV replacement

## 2015-04-07 NOTE — Telephone Encounter (Signed)
Order for echo. Spoke with patient's wife. Pt has taken warfarin in the past and is familiar with this. We could set up in [place] for convenience but she states they prefer to come to [place].  Adv someone will call to schedule a new Coumadin appointment as well as echo. Mrs. filadelfio mceuen understanding and appreciation for the call.

## 2015-04-13 ENCOUNTER — Other Ambulatory Visit (HOSPITAL_COMMUNITY): Payer: Medicare Other

## 2015-04-14 ENCOUNTER — Telehealth: Payer: Self-pay | Admitting: *Deleted

## 2015-04-14 MED ORDER — WARFARIN SODIUM 2 MG PO TABS: 2.0000 mg | ORAL_TABLET | ORAL | Status: DC

## 2015-04-14 NOTE — Telephone Encounter (Signed)
Coumadin dose will be 2mg s daily except 3mg  on Tues, Thurs and Sun to equal 17mg s that was his previous weekly dosage.

## 2015-04-14 NOTE — Telephone Encounter (Signed)
Spoke with spouse, pt has previously been on coumadin with [place] in the past. Thus, Rx called into pharmacy for Coumadin 2mg s tablet, pt to overlap Coumadin with Xarelto starting today for 3 days, then discontinue Xarelto. Pt will start Coumadin 2mg s QD except 3mg s  On Sunday. Follow up appt moved to Wednesday.

## 2015-04-18 ENCOUNTER — Telehealth: Payer: Self-pay | Admitting: *Deleted

## 2015-04-18 NOTE — Telephone Encounter (Signed)
[name] called stating scheduled for ECHO on Thursday so wanted his appt changed to Thursday and wife asked what dose of coumadin should he take on Wednesday and  instructed to have him take coumadin 2mg  on Wednesday and she states understanding

## 2015-04-21 ENCOUNTER — Ambulatory Visit (INDEPENDENT_AMBULATORY_CARE_PROVIDER_SITE_OTHER): Payer: Medicare Other | Admitting: *Deleted

## 2015-04-21 ENCOUNTER — Ambulatory Visit (HOSPITAL_COMMUNITY): Payer: Medicare Other | Attending: Cardiology

## 2015-04-21 ENCOUNTER — Other Ambulatory Visit: Payer: Self-pay

## 2015-04-21 DIAGNOSIS — I359 Nonrheumatic aortic valve disorder, unspecified: Secondary | ICD-10-CM | POA: Diagnosis present

## 2015-04-21 DIAGNOSIS — I5189 Other ill-defined heart diseases: Secondary | ICD-10-CM | POA: Insufficient documentation

## 2015-04-21 DIAGNOSIS — I340 Nonrheumatic mitral (valve) insufficiency: Secondary | ICD-10-CM | POA: Insufficient documentation

## 2015-04-21 DIAGNOSIS — Z952 Presence of prosthetic heart valve: Secondary | ICD-10-CM

## 2015-04-21 DIAGNOSIS — I059 Rheumatic mitral valve disease, unspecified: Secondary | ICD-10-CM | POA: Insufficient documentation

## 2015-04-21 DIAGNOSIS — I351 Nonrheumatic aortic (valve) insufficiency: Secondary | ICD-10-CM | POA: Insufficient documentation

## 2015-04-21 DIAGNOSIS — Z953 Presence of xenogenic heart valve: Secondary | ICD-10-CM | POA: Insufficient documentation

## 2015-04-21 DIAGNOSIS — Z5181 Encounter for therapeutic drug level monitoring: Secondary | ICD-10-CM | POA: Diagnosis not present

## 2015-04-21 DIAGNOSIS — I517 Cardiomegaly: Secondary | ICD-10-CM | POA: Diagnosis not present

## 2015-04-21 DIAGNOSIS — I4891 Unspecified atrial fibrillation: Secondary | ICD-10-CM | POA: Diagnosis not present

## 2015-04-21 DIAGNOSIS — Z951 Presence of aortocoronary bypass graft: Secondary | ICD-10-CM | POA: Diagnosis not present

## 2015-04-21 DIAGNOSIS — Z954 Presence of other heart-valve replacement: Secondary | ICD-10-CM

## 2015-04-21 LAB — POCT INR: INR: 2.6

## 2015-04-26 ENCOUNTER — Ambulatory Visit (INDEPENDENT_AMBULATORY_CARE_PROVIDER_SITE_OTHER): Payer: Medicare Other | Admitting: Internal Medicine

## 2015-04-26 ENCOUNTER — Encounter: Payer: Self-pay | Admitting: Internal Medicine

## 2015-04-26 VITALS — BP 130/68 | HR 77 | Temp 98.2°F | Resp 16 | Wt 146.0 lb

## 2015-04-26 DIAGNOSIS — I4891 Unspecified atrial fibrillation: Secondary | ICD-10-CM | POA: Diagnosis not present

## 2015-04-26 DIAGNOSIS — I10 Essential (primary) hypertension: Secondary | ICD-10-CM | POA: Diagnosis not present

## 2015-04-26 DIAGNOSIS — N289 Disorder of kidney and ureter, unspecified: Secondary | ICD-10-CM

## 2015-04-26 DIAGNOSIS — G43909 Migraine, unspecified, not intractable, without status migrainosus: Secondary | ICD-10-CM | POA: Insufficient documentation

## 2015-04-26 DIAGNOSIS — I2510 Atherosclerotic heart disease of native coronary artery without angina pectoris: Secondary | ICD-10-CM

## 2015-04-26 DIAGNOSIS — E785 Hyperlipidemia, unspecified: Secondary | ICD-10-CM | POA: Diagnosis not present

## 2015-04-26 DIAGNOSIS — G43809 Other migraine, not intractable, without status migrainosus: Secondary | ICD-10-CM

## 2015-04-26 NOTE — Progress Notes (Signed)
Subjective:    Patient ID: Robert Richard, male    DOB: [birth year], 82 y.o.   MRN: [id number]  HPI He is here to establish with a new pcp.  He is here for followup.   Afib, CAD, Hypertension: He is taking his medication daily. He is compliant with a low sodium diet.  He denies chest pain, palpitations, edema, shortness of breath and regular headaches. He is exercising regularly.  He does not monitor his blood pressure at home.    Hyperlipidemia: He is taking his medication daily. He is compliant with a low fat/cholesterol diet. He is exercising regularly. He denies myalgias.   GERD:  He is taking his medication daily as prescribed.  He denies any GERD symptoms and feels his GERD is well controlled.    Migraines:  He takes topamax dialy.  He feels the migraines are controlled.  Legs feel weak sometimes when he walks.  He does not walk as much as used to like he did prior to his heart surgery.      Medications and allergies reviewed with patient and updated if appropriate.  Patient Active Problem List   Diagnosis Date Noted  . Aortic valve replaced 04/21/2015  . Subacute confusional state 12/14/2014  . Dyspnea 09/16/2013  . Throat pain 08/19/2013  . Pulmonary infiltrates 07/31/2013  . HCAP (healthcare-associated pneumonia) 03/23/2013  . Encounter for therapeutic drug monitoring 03/20/2013  . UTI (lower urinary tract infection) 03/13/2013  . CAD (coronary artery disease) 03/13/2013  . Atrial fibrillation, controlled ([place]) 03/13/2013  . Protein-calorie malnutrition, severe ([place]) 02/27/2013  . S/P CABG x 3 02/20/2013  . History of embolectomy 09/22/2012  . Renal insufficiency, mild 09/22/2012  . New onset atrial fibrillation ([place]) 09/15/2012  . Syncope 09/15/2012  . Peripheral vascular disease, unspecified ([place]) 07/08/2012  . Mitral stenosis 07/27/2010  . Hyperlipidemia 10/25/2009  . Essential hypertension 10/25/2009  . NEPHROLITHIASIS, HX OF 10/25/2009  . SLEEP DISORDER  11/29/2008  . SNORING 10/14/2008  . FATIGUE 10/05/2008  . APNEA 10/05/2008  . SKIN CANCER, HX OF 10/05/2008  . FASTING HYPERGLYCEMIA 05/06/2008  . RESTLESS LEG SYNDROME 09/11/2007  . Allergic rhinitis, cause unspecified 09/11/2007  . GOUT 03/15/2006  . ANEMIA-NOS 03/15/2006  . ASTHMA 03/15/2006  . COPD 03/15/2006  . OSTEOPOROSIS 03/15/2006  . COLONIC POLYPS, HX OF 03/15/2006    Current Outpatient Prescriptions on File Prior to Visit  Medication Sig Dispense Refill  . busPIRone (BUSPAR) 15 MG tablet TAKE ONE-HALF TABLET BY MOUTH TWO TIMES DAILY 90 tablet 0  . diltiazem (CARDIZEM CD) 120 MG 24 hr capsule Take 1 capsule (120 mg total) by mouth daily. Take in addition to 240 mg tablet daily 30 capsule 6  . diltiazem (CARDIZEM CD) 240 MG 24 hr capsule TAKE ONE CAPSULE BY MOUTH DAILY 30 capsule 11  . ketotifen (ZADITOR) 0.025 % ophthalmic solution Place 1 drop into both eyes daily.     . Lidocaine HCl 4 % SOLN Apply 4 mLs topically as needed (for migraines).     . Multiple Vitamins-Minerals (DAILY MULTIVITAMIN) CAPS Take 1 capsule by mouth daily.     . nitroGLYCERIN (NITROSTAT) 0.4 MG SL tablet Place 0.4 mg under the tongue every 5 (five) minutes as needed for chest pain.    . polyethylene glycol (MIRALAX / GLYCOLAX) packet Take 17 g by mouth daily. 14 each 0  . pravastatin (PRAVACHOL) 40 MG tablet TAKE ONE (1) TABLET EACH DAY 90 tablet 3  . ranitidine (ZANTAC) 150 MG  tablet TAKE ONE TABLET TWICE DAILY 180 tablet 3  . topiramate (TOPAMAX) 25 MG tablet Take 75 mg by mouth daily.    Marland Kitchen warfarin (COUMADIN) 2 MG tablet Take 1 tablet (2 mg total) by mouth as directed. 40 tablet 3   No current facility-administered medications on file prior to visit.    Past Medical History  Diagnosis Date  . Anemia   . Colon polyp   . COPD (chronic obstructive pulmonary disease) (HCC)     Astmatic bronchitis  . Asthma with bronchitis   . Gout     PMH  . Osteoporosis   . Allergic rhinitis   . Skin  cancer, basal cell     Dr [place] Crane, PMH  . Nephrolithiasis   . HLD (hyperlipidemia)   . Skin cancer (melanoma) ([place]) 2012    Right neck  . Arthritis     SHOULDER  . Headache(784.0)   . RLS (restless legs syndrome)   . Dysrhythmia 08/2012    NEW ONSET ATRIAL FIBRILATION    Past Surgical History  Procedure Laterality Date  . Finger surgery      for foreign body  . Colonoscopy w/ polypectomy      Dr Deatra Ina  . Inguinal hernia repair    . Nose surgery      Septal Deviation  . Neck surgery  12/2010    Melanoma ; [facility]   . Leg surgery  06/2010     Dr.Lawson, for blood clott. Left leg   . Tee without cardioversion N/A 01/23/2013    Procedure: TRANSESOPHAGEAL ECHOCARDIOGRAM (TEE);  Surgeon: Jolaine Artist, MD;  Location: [facility] ENDOSCOPY;  Service: Cardiovascular;  Laterality: N/A;  sign heart cath consent w/tee consent  . Coronary artery bypass graft N/A 02/20/2013    Procedure: CORONARY ARTERY BYPASS GRAFTING (CABG);  Surgeon: Ivin Poot, MD;  Location: [place];  Service: Open Heart Surgery;  Laterality: N/A;  . Aortic valve replacement N/A 02/20/2013    Procedure: AORTIC VALVE REPLACEMENT (AVR);  Surgeon: Ivin Poot, MD;  Location: [place];  Service: Open Heart Surgery;  Laterality: N/A;  . Intraoperative transesophageal echocardiogram N/A 02/20/2013    Procedure: INTRAOPERATIVE TRANSESOPHAGEAL ECHOCARDIOGRAM;  Surgeon: Ivin Poot, MD;  Location: [place];  Service: Open Heart Surgery;  Laterality: N/A;  . Left and right heart catheterization with coronary angiogram N/A 01/05/2013    Procedure: LEFT AND RIGHT HEART CATHETERIZATION WITH CORONARY ANGIOGRAM;  Surgeon: Blane Ohara, MD;  Location: [facility] CATH LAB;  Service: Cardiovascular;  Laterality: N/A;  . Right heart catheterization N/A 01/23/2013    Procedure: RIGHT HEART CATH;  Surgeon: Jolaine Artist, MD;  Location: [facility] CATH LAB;  Service: Cardiovascular;  Laterality: N/A;    Social History   Social History  .  Marital Status: Married    Spouse Name: N/A  . Number of Children: N/A  . Years of Education: N/A   Occupational History  . retired Astron[employer]   Social History Main Topics  . Smoking status: Former Smoker -- 3.00 packs/day for 15 years    Types: Cigarettes    Quit date: 02/19/1961  . Smokeless tobacco: Never Used  . Alcohol Use: No  . Drug Use: No  . Sexual Activity: Not on file   Other Topics Concern  . Not on file   Social History Narrative    Family History  Problem Relation Age of Onset  . Allergies Mother   . Coronary artery disease Mother   .  Allergy (severe) Mother   . Allergies Father   . Stroke Father   . Heart attack Father 64  . Asthma Father   . Allergy (severe) Father   . Allergies Sister   . Allergy (severe) Sister   . Allergies Brother     x2  . Allergy (severe) Brother   . Diabetes Brother   . Allergy (severe) Brother   . Emphysema Brother     smoked  . Cancer Son     Review of Systems  Constitutional: Negative for fever, appetite change and unexpected weight change.  Respiratory: Negative for cough, shortness of breath and wheezing.   Cardiovascular: Negative for chest pain, palpitations and leg swelling.  Gastrointestinal: Negative for nausea and abdominal pain.       NO GERD  Neurological: Negative for dizziness, light-headedness and headaches.       Objective:   Filed Vitals:   04/26/15 1427  BP: 130/68  Pulse: 77  Temp: 98.2 F (36.8 C)  Resp: 16   Filed Weights   04/26/15 1427  Weight: 146 lb (66.225 kg)   Body mass index is 19.8 kg/(m^2).   Physical Exam Constitutional: Appears well-developed and well-nourished. No distress.  Neck: Neck supple. No tracheal deviation present. No thyromegaly present.  No carotid bruit. No cervical adenopathy.   Cardiovascular: Normal rate, regular rhythm and normal heart sounds.   2/6 systolic murmur.  No edema Pulmonary/Chest: Effort normal and breath sounds normal. No  respiratory distress. No wheezes.           Assessment & Plan:   Leg weakness Deconditioning Not exercising Deferred PT at this time Will start exercising  See Problem List for Assessment and Plan of chronic medical problems.

## 2015-04-26 NOTE — Assessment & Plan Note (Signed)
No symptoms On warfarin, CCB, statin Follows with cardiology Continue current medications

## 2015-04-26 NOTE — Assessment & Plan Note (Signed)
Check lipid panel, cmp On pravastatin

## 2015-04-26 NOTE — Assessment & Plan Note (Signed)
Check cmp 

## 2015-04-26 NOTE — Progress Notes (Signed)
Pre visit review using our clinic review tool, if applicable. No additional management support is needed unless otherwise documented below in the visit note. 

## 2015-04-26 NOTE — Assessment & Plan Note (Signed)
Rate controlled, in sinus rhythm Follows with cardiology Continue current medications

## 2015-04-26 NOTE — Patient Instructions (Signed)
Have fasting blood work done at [facility].   Test(s) ordered today. Your results will be released to [facility] (or called to you) after review, usually within 72hours after test completion. If any changes need to be made, you will be notified at that same time.    No immunizations administered today.   Medications reviewed and updated.   No changes recommended at this time.    Please followup annually

## 2015-04-26 NOTE — Assessment & Plan Note (Signed)
Controlled with topamax Continue current dose

## 2015-04-26 NOTE — Assessment & Plan Note (Signed)
BP controlled here Continue current medication Check cmp

## 2015-04-28 ENCOUNTER — Ambulatory Visit (INDEPENDENT_AMBULATORY_CARE_PROVIDER_SITE_OTHER): Payer: Medicare Other | Admitting: *Deleted

## 2015-04-28 DIAGNOSIS — I4891 Unspecified atrial fibrillation: Secondary | ICD-10-CM | POA: Diagnosis not present

## 2015-04-28 DIAGNOSIS — Z5181 Encounter for therapeutic drug level monitoring: Secondary | ICD-10-CM

## 2015-04-28 DIAGNOSIS — Z952 Presence of prosthetic heart valve: Secondary | ICD-10-CM

## 2015-04-28 DIAGNOSIS — Z954 Presence of other heart-valve replacement: Secondary | ICD-10-CM | POA: Diagnosis not present

## 2015-04-28 LAB — POCT INR: INR: 4.2

## 2015-05-05 ENCOUNTER — Ambulatory Visit (INDEPENDENT_AMBULATORY_CARE_PROVIDER_SITE_OTHER): Payer: Medicare Other | Admitting: Pharmacist

## 2015-05-05 DIAGNOSIS — Z954 Presence of other heart-valve replacement: Secondary | ICD-10-CM

## 2015-05-05 DIAGNOSIS — Z5181 Encounter for therapeutic drug level monitoring: Secondary | ICD-10-CM

## 2015-05-05 DIAGNOSIS — Z952 Presence of prosthetic heart valve: Secondary | ICD-10-CM

## 2015-05-05 DIAGNOSIS — I4891 Unspecified atrial fibrillation: Secondary | ICD-10-CM

## 2015-05-05 LAB — POCT INR: INR: 2.5

## 2015-05-12 ENCOUNTER — Ambulatory Visit (INDEPENDENT_AMBULATORY_CARE_PROVIDER_SITE_OTHER): Payer: Medicare Other | Admitting: *Deleted

## 2015-05-12 DIAGNOSIS — I4891 Unspecified atrial fibrillation: Secondary | ICD-10-CM

## 2015-05-12 DIAGNOSIS — Z954 Presence of other heart-valve replacement: Secondary | ICD-10-CM

## 2015-05-12 DIAGNOSIS — Z952 Presence of prosthetic heart valve: Secondary | ICD-10-CM

## 2015-05-12 DIAGNOSIS — Z5181 Encounter for therapeutic drug level monitoring: Secondary | ICD-10-CM

## 2015-05-12 LAB — POCT INR: INR: 3.1

## 2015-05-16 ENCOUNTER — Ambulatory Visit: Payer: Medicare Other | Admitting: Internal Medicine

## 2015-05-23 ENCOUNTER — Ambulatory Visit (INDEPENDENT_AMBULATORY_CARE_PROVIDER_SITE_OTHER): Payer: Medicare Other | Admitting: *Deleted

## 2015-05-23 ENCOUNTER — Ambulatory Visit (INDEPENDENT_AMBULATORY_CARE_PROVIDER_SITE_OTHER): Payer: Medicare Other

## 2015-05-23 DIAGNOSIS — Z954 Presence of other heart-valve replacement: Secondary | ICD-10-CM | POA: Diagnosis not present

## 2015-05-23 DIAGNOSIS — Z5181 Encounter for therapeutic drug level monitoring: Secondary | ICD-10-CM | POA: Diagnosis not present

## 2015-05-23 DIAGNOSIS — R000 Tachycardia, unspecified: Secondary | ICD-10-CM | POA: Diagnosis not present

## 2015-05-23 DIAGNOSIS — I4891 Unspecified atrial fibrillation: Secondary | ICD-10-CM

## 2015-05-23 DIAGNOSIS — Z952 Presence of prosthetic heart valve: Secondary | ICD-10-CM

## 2015-05-23 LAB — POCT INR: INR: 3.2

## 2015-05-24 ENCOUNTER — Other Ambulatory Visit: Payer: Self-pay | Admitting: Internal Medicine

## 2015-06-03 ENCOUNTER — Other Ambulatory Visit: Payer: Self-pay | Admitting: Internal Medicine

## 2015-06-06 ENCOUNTER — Ambulatory Visit (INDEPENDENT_AMBULATORY_CARE_PROVIDER_SITE_OTHER): Payer: Medicare Other | Admitting: *Deleted

## 2015-06-06 DIAGNOSIS — Z954 Presence of other heart-valve replacement: Secondary | ICD-10-CM

## 2015-06-06 DIAGNOSIS — Z5181 Encounter for therapeutic drug level monitoring: Secondary | ICD-10-CM | POA: Diagnosis not present

## 2015-06-06 DIAGNOSIS — I4891 Unspecified atrial fibrillation: Secondary | ICD-10-CM | POA: Diagnosis not present

## 2015-06-06 DIAGNOSIS — Z952 Presence of prosthetic heart valve: Secondary | ICD-10-CM

## 2015-06-06 LAB — POCT INR: INR: 2.4

## 2015-06-20 ENCOUNTER — Ambulatory Visit (INDEPENDENT_AMBULATORY_CARE_PROVIDER_SITE_OTHER): Payer: Medicare Other | Admitting: *Deleted

## 2015-06-20 DIAGNOSIS — Z952 Presence of prosthetic heart valve: Secondary | ICD-10-CM

## 2015-06-20 DIAGNOSIS — Z954 Presence of other heart-valve replacement: Secondary | ICD-10-CM | POA: Diagnosis not present

## 2015-06-20 DIAGNOSIS — Z5181 Encounter for therapeutic drug level monitoring: Secondary | ICD-10-CM

## 2015-06-20 DIAGNOSIS — I4891 Unspecified atrial fibrillation: Secondary | ICD-10-CM | POA: Diagnosis not present

## 2015-06-20 LAB — POCT INR: INR: 3.2

## 2015-07-04 ENCOUNTER — Ambulatory Visit (INDEPENDENT_AMBULATORY_CARE_PROVIDER_SITE_OTHER): Payer: Medicare Other | Admitting: *Deleted

## 2015-07-04 DIAGNOSIS — Z952 Presence of prosthetic heart valve: Secondary | ICD-10-CM

## 2015-07-04 DIAGNOSIS — Z5181 Encounter for therapeutic drug level monitoring: Secondary | ICD-10-CM

## 2015-07-04 DIAGNOSIS — I4891 Unspecified atrial fibrillation: Secondary | ICD-10-CM | POA: Diagnosis not present

## 2015-07-04 DIAGNOSIS — Z954 Presence of other heart-valve replacement: Secondary | ICD-10-CM

## 2015-07-04 LAB — POCT INR: INR: 2.6

## 2015-07-25 ENCOUNTER — Ambulatory Visit (INDEPENDENT_AMBULATORY_CARE_PROVIDER_SITE_OTHER): Payer: Medicare Other | Admitting: *Deleted

## 2015-07-25 DIAGNOSIS — I4891 Unspecified atrial fibrillation: Secondary | ICD-10-CM

## 2015-07-25 DIAGNOSIS — Z954 Presence of other heart-valve replacement: Secondary | ICD-10-CM | POA: Diagnosis not present

## 2015-07-25 DIAGNOSIS — Z5181 Encounter for therapeutic drug level monitoring: Secondary | ICD-10-CM | POA: Diagnosis not present

## 2015-07-25 DIAGNOSIS — Z952 Presence of prosthetic heart valve: Secondary | ICD-10-CM

## 2015-07-25 LAB — POCT INR: INR: 3.2

## 2015-07-26 ENCOUNTER — Other Ambulatory Visit: Payer: Self-pay | Admitting: Emergency Medicine

## 2015-07-26 MED ORDER — RANITIDINE HCL 150 MG PO TABS: 150.0000 mg | ORAL_TABLET | Freq: Two times a day (BID) | ORAL | Status: DC

## 2015-08-15 ENCOUNTER — Ambulatory Visit (INDEPENDENT_AMBULATORY_CARE_PROVIDER_SITE_OTHER): Payer: Medicare Other | Admitting: *Deleted

## 2015-08-15 DIAGNOSIS — Z952 Presence of prosthetic heart valve: Secondary | ICD-10-CM

## 2015-08-15 DIAGNOSIS — Z5181 Encounter for therapeutic drug level monitoring: Secondary | ICD-10-CM | POA: Diagnosis not present

## 2015-08-15 DIAGNOSIS — I4891 Unspecified atrial fibrillation: Secondary | ICD-10-CM

## 2015-08-15 DIAGNOSIS — Z954 Presence of other heart-valve replacement: Secondary | ICD-10-CM | POA: Diagnosis not present

## 2015-08-15 LAB — POCT INR: INR: 2.8

## 2015-08-22 ENCOUNTER — Other Ambulatory Visit: Payer: Self-pay | Admitting: Internal Medicine

## 2015-08-24 ENCOUNTER — Other Ambulatory Visit: Payer: Self-pay | Admitting: *Deleted

## 2015-08-24 MED ORDER — DILTIAZEM HCL ER COATED BEADS 120 MG PO CP24: 120.0000 mg | ORAL_CAPSULE | Freq: Every day | ORAL | Status: DC

## 2015-09-03 ENCOUNTER — Telehealth: Payer: Self-pay

## 2015-09-03 NOTE — Telephone Encounter (Signed)
Patient is on the list for [facility] 2017 and may be a good candidate for an AWV in 2017. Please let me know if/when appt is scheduled.   

## 2015-09-05 NOTE — Telephone Encounter (Signed)
Call to Ms. Bhavsar, scheduled AWV for 8/1 at 3pm;

## 2015-09-07 ENCOUNTER — Other Ambulatory Visit: Payer: Self-pay | Admitting: Internal Medicine

## 2015-09-08 ENCOUNTER — Telehealth: Payer: Self-pay | Admitting: Internal Medicine

## 2015-09-08 ENCOUNTER — Other Ambulatory Visit: Payer: Self-pay | Admitting: Internal Medicine

## 2015-09-08 NOTE — Telephone Encounter (Signed)
Robert Richard is calling because she is needing specific directions for the patient Robert Richard 2mg  . Please call   Thanks

## 2015-09-08 NOTE — Telephone Encounter (Signed)
Pt request refill for pravastatin (PRAVACHOL) 40 MG tablet send to [place]. Please help

## 2015-09-08 NOTE — Telephone Encounter (Signed)
Returned phone call and clarified Coumadin instructions.

## 2015-09-09 MED ORDER — PRAVASTATIN SODIUM 40 MG PO TABS: ORAL_TABLET | ORAL | Status: DC

## 2015-09-09 NOTE — Telephone Encounter (Signed)
RX sent to [facility] 

## 2015-09-12 ENCOUNTER — Ambulatory Visit (INDEPENDENT_AMBULATORY_CARE_PROVIDER_SITE_OTHER): Payer: Medicare Other

## 2015-09-12 DIAGNOSIS — Z5181 Encounter for therapeutic drug level monitoring: Secondary | ICD-10-CM | POA: Diagnosis not present

## 2015-09-12 DIAGNOSIS — Z952 Presence of prosthetic heart valve: Secondary | ICD-10-CM

## 2015-09-12 DIAGNOSIS — I4891 Unspecified atrial fibrillation: Secondary | ICD-10-CM

## 2015-09-12 DIAGNOSIS — Z954 Presence of other heart-valve replacement: Secondary | ICD-10-CM

## 2015-09-12 LAB — POCT INR: INR: 2.8

## 2015-09-19 NOTE — Progress Notes (Addendum)
Subjective:   Robert Richard is a 82 y.o. male who presents for Medicare Annual/Subsequent preventive examination.  Review of Systems:   HRA assessment completed during this visit with   The Patient was informed that the wellness visit is to identify future health risk and educate and initiate measures that can reduce risk for increased disease through the lifespan.    NO ROS; Medicare Wellness Visit Last OV:  04/2015 Labs completed: to be completed   Pain; in feet   Walking is difficult since CABG Jan 2015  Declines PT  Psychosocial: lives with spouse/ one son  One grandson helps on farm;    Medications reviewed for issues; compliance; otc meds   BMI: 19; lost weight since cabg but has slowly gained; Dropped to 136 in the hospital Appetite is very good; Spouse cooks   Diet;   Hx of gout;  Greens 3 times a week due to INR Breakfast; cereal;  Lunch; vary;  Meat, vegetables at dinner   Teeth or Denture issues? Seen dentist last week ;  Exercise;   Walks out to the road to get the paper (278ft)  Walks to the barn and watches the swallows. Had Organic garden- plans to plant another   Did raise horses; rents out land now for cows    HOME SAFETY plans to stay in home long term [place] a pond[place]  Walks around property;  Fell off the table; climbed table to change bulb One level and basement; goes up and down stairs; discussed lift but did not feel this was necessary Bathrooms; use the shower downstairs;    Personal safety issues reviewed for risk such as safe community; smoke detector; firearms safety if applicable; protection when in the sun; driving safety for seniors or any recent accidents. Does not drive since surgery.  Has picnic shelter in the back where he stays  Skin cancers from working when young; covers now or wears a hat Fall hx; x 1   UA or BOWEL incontinence; no  Functional losses from last year to this year? Not  really  Given education on "Fall Prevention in the Home" for more safety tips the patient can apply as appropriate.  No more climbing;   Risk for Depression reviewed: Any emotional problems? Anxious, depressed, irritable, sad or blue? no Denies feeling depressed or hopeless; voices pleasure in daily life How many social activities have you been engaged in within the last 2 weeks? no  Sleep; sleeps well;   Cognitive; no memory issues  Manages checkbook, medications; no failures of task Ad8 score reviewed for issues;  Issues making decisions; no  Less interest in hobbies / activities" no  Repeats questions, stories; family complaining: NO  Trouble using ordinary gadgets; microwave; computer: no  Forgets the month or year: no  Mismanaging finances: no  Missing apt: no but does write them down  Daily problems with thinking of memory NO Ad8 score is 0   Advanced Directive addressed; Completed or educated yes have completed   Counseling Health Maintenance Gaps:  Colonoscopy; aged out  EKG: 03/21/2015; apt and fup with cardiology   Prostate cancer screening: checked by dr. Blair Dolphin with [facility]  Hearing screen; hearing aid for right;  Tinnitus; in left ear   Ophthalmology exam/ has double vision; glasses helps  Dr. Katy Fitch needs to go back this month    Immunizations Due: (Vaccines reviewed and educated regarding any overdue)  Zostavax;  TD; doe  not think it has been 10 years  Will defer another year; will discuss with Dr. Quay Burow   Established and updated Risk reviewed and appropriate referral made or health recommendations:  Current Care Team reviewed and updated   Cardiac Risk Factors include: advanced age (>37men, >31 women);dyslipidemia;family history of premature cardiovascular disease;hypertension;male gender;sedentary lifestyle     Objective:    Vitals: Ht 6\' 1"  (1.854 m)   Wt 148 lb (67.1 kg)   BMI 19.53 kg/m   Body mass index is 19.53  kg/m.  Tobacco History  Smoking Status  . Former Smoker  . Packs/day: 3.00  . Years: 15.00  . Types: Cigarettes  . Quit date: 02/19/1961  Smokeless Tobacco  . Never Used     Counseling given: Not Answered   Past Medical History:  Diagnosis Date  . Allergic rhinitis   . Anemia   . Arthritis    SHOULDER  . Asthma with bronchitis   . Colon polyp   . COPD (chronic obstructive pulmonary disease) (HCC)    Astmatic bronchitis  . Dysrhythmia 08/2012   NEW ONSET ATRIAL FIBRILATION  . Gout    PMH  . Headache(784.0)   . HLD (hyperlipidemia)   . Nephrolithiasis   . Osteoporosis   . RLS (restless legs syndrome)   . Skin cancer (melanoma) ([place]) 2012   Right neck  . Skin cancer, basal cell    Dr Nevada Crane, [facility]   Past Surgical History:  Procedure Laterality Date  . AORTIC VALVE REPLACEMENT N/A 02/20/2013   Procedure: AORTIC VALVE REPLACEMENT (AVR);  Surgeon: Ivin Poot, MD;  Location: [place];  Service: Open Heart Surgery;  Laterality: N/A;  . COLONOSCOPY W/ POLYPECTOMY     Dr Deatra Ina  . CORONARY ARTERY BYPASS GRAFT N/A 02/20/2013   Procedure: CORONARY ARTERY BYPASS GRAFTING (CABG);  Surgeon: Ivin Poot, MD;  Location: [place];  Service: Open Heart Surgery;  Laterality: N/A;  . FINGER SURGERY     for foreign body  . INGUINAL HERNIA REPAIR    . INTRAOPERATIVE TRANSESOPHAGEAL ECHOCARDIOGRAM N/A 02/20/2013   Procedure: INTRAOPERATIVE TRANSESOPHAGEAL ECHOCARDIOGRAM;  Surgeon: Ivin Poot, MD;  Location: [place];  Service: Open Heart Surgery;  Laterality: N/A;  . LEFT AND RIGHT HEART CATHETERIZATION WITH CORONARY ANGIOGRAM N/A 01/05/2013   Procedure: LEFT AND RIGHT HEART CATHETERIZATION WITH CORONARY ANGIOGRAM;  Surgeon: Blane Ohara, MD;  Location[facility] C[facility] LAB;  Service: Cardiovascular;  Laterality: N/A;  . LEG SURGERY  06/2010    Dr.Lawson, for blood clott. Left leg   . NECK SURGERY  12/2010   Melanoma ; [facility]   . NOSE SURGERY     Septal Deviation  . RIGHT HEART  CATHETERIZATION N/A 01/23/2013   Procedure: RIGHT HEART CATH;  Surgeon: Jolaine Artist, MD;  Location[facility] C[facility] LAB;  Service: Cardiovascular;  Laterality: N/A;  . TEE WITHOUT CARDIOVERSION N/A 01/23/2013   Procedure: TRANSESOPHAGEAL ECHOCARDIOGRAM (TEE);  Surgeon: Jolaine Artist, MD;  Location: [facility] ENDOSCOPY;  Service: Cardiovascular;  Laterality: N/A;  sign heart cath consent w/tee consent   Family History  Problem Relation Age of Onset  . Allergies Mother   . Coronary artery disease Mother   . Allergy (severe) Mother   . Allergies Father   . Stroke Father   . Heart attack Father 55  . Asthma Father   . Allergy (severe) Father   . Allergies Sister   . Allergy (severe) Sister   . Allergies Brother  x2  . Allergy (severe) Brother   . Diabetes Brother   . Allergy (severe) Brother   . Emphysema Brother     smoked  . Cancer Son    History  Sexual Activity  . Sexual activity: Not on file    Outpatient Encounter Prescriptions as of 09/20/2015  Medication Sig  . busPIRone (BUSPAR) 15 MG tablet TAKE ONE-HALF TABLET BY MOUTH TWICE A DAY  . diltiazem (CARDIZEM CD) 120 MG 24 hr capsule Take 1 capsule (120 mg total) by mouth daily. Take in addition to 240 mg tablet daily  . diltiazem (CARDIZEM CD) 240 MG 24 hr capsule Take 1 capsule (240 mg total) by mouth daily.  Marland Kitchen ketotifen (ZADITOR) 0.025 % ophthalmic solution Place 1 drop into both eyes daily.   . Lidocaine HCl 4 % SOLN Apply 4 mLs topically as needed (for migraines).   . Multiple Vitamins-Minerals (DAILY MULTIVITAMIN) CAPS Take 1 capsule by mouth daily.   . nitroGLYCERIN (NITROSTAT) 0.4 MG SL tablet Place 0.4 mg under the tongue every 5 (five) minutes as needed for chest pain.  . polyethylene glycol (MIRALAX / GLYCOLAX) packet Take 17 g by mouth daily.  . pravastatin (PRAVACHOL) 40 MG tablet TAKE ONE (1) TABLET EACH DAY  . ranitidine (ZANTAC) 150 MG tablet Take 1 tablet (150 mg total) by mouth 2 (two) times daily.  Marland Kitchen  topiramate (TOPAMAX) 25 MG tablet Take 75 mg by mouth daily.  Marland Kitchen warfarin (COUMADIN) 2 MG tablet TAKE 1 TABLET (2MG ) BY MOUTH AS DIRECTED   No facility-administered encounter medications on file as of 09/20/2015.     Activities of Daily Living In your present state of health, do you have any difficulty performing the following activities: 09/20/2015  Hearing? Y  Vision? Y  Difficulty concentrating or making decisions? N  Walking or climbing stairs? N  Dressing or bathing? N  Doing errands, shopping? N  Preparing Food and eating ? N  Using the Toilet? N  In the past six months, have you accidently leaked urine? N  Do you have problems with loss of bowel control? N  Managing your Medications? N  Managing your Finances? N  Housekeeping or managing your Housekeeping? N  Some recent data might be hidden    Patient Care Team: [name], MD as PCP - General (Internal Medicine)   Assessment:     Exercise Activities and Dietary recommendations Current Exercise Habits: Home exercise routine, Time (Minutes): 20 (walks around the yard ), Intensity: Mild  Goals    . patient          Plant a garden;  Network engineer, cucumber; spaghetti squash;  Water melon       Fall Risk Fall Risk  11/10/2013 03/23/2013  Falls in the past year? Yes No  Number falls in past yr: 1 -  Injury with Fall? No -  Risk for fall due to : Impaired vision -   Depression Screen PHQ 2/9 Scores 11/10/2013 03/23/2013  PHQ - 2 Score 0 0    Cognitive Testing MMSE - Mini Mental State Exam 09/20/2015  Not completed: (No Data)   Ad8 score 0  Immunization History  Administered Date(s) Administered  . Influenza Split 11/28/2010, 11/13/2011  . Influenza Whole 12/03/2006, 11/12/2007, 11/25/2008, 12/09/2009  . Influenza, High Dose Seasonal PF 11/20/2012  . Influenza,inj,Quad PF,36+ Mos 11/10/2013, 11/19/2014  . Pneumococcal Conjugate-13 09/16/2013  . Pneumococcal Polysaccharide-23 05/20/2006   Screening Tests Health  Maintenance  Topic Date Due  . TETANUS/TDAP  05/26/1949  . ZOSTAVAX  05/25/1992  . INFLUENZA VACCINE  09/20/2015  . PNA vac Low Risk Adult  Completed      Plan:      Will discuss tetanus with Dr. Quay Burow; or defer x 1 year  Flu shot in the fall   Will fup with Dr. Quay Burow on lab work and should they wait one year prior to repeating. Last OV states to come in annually;  Will have labs drawn anytime but need to confirm annual for date after October; or in March.  During the course of the visit the patient was educated and counseled about the following appropriate screening and preventive services:   Vaccines to include Pneumoccal, Influenza, Hepatitis B, Td, Zostavax, HCV  Hold Td / or TDAP; wife feels he had this prior to 10 years   Electrocardiogram/ deferred to cardiology apt in fall   Cardiovascular Disease/ deferred to cardiology apt in the fall  Colorectal cancer screening- aged out  Diabetes screening/ to check A1c at next blood draw   Prostate Cancer Screening/ deferred   Glaucoma screening/ Dr Katy Fitch; will go back this month  Nutrition counseling / gaining weight slowly which is his preference Smoking cessation counseling/ quit many years ago  Patient Instructions (the written plan) was given to the patient.    [id number], RN  09/20/2015  Medical screening examination/treatment/procedure(s) were performed by non-physician practitioner and as supervising physician I was immediately available for consultation/collaboration. I agree with above. [name], MD

## 2015-09-20 ENCOUNTER — Ambulatory Visit (INDEPENDENT_AMBULATORY_CARE_PROVIDER_SITE_OTHER): Payer: Medicare Other

## 2015-09-20 ENCOUNTER — Telehealth: Payer: Self-pay

## 2015-09-20 VITALS — Ht 73.0 in | Wt 148.0 lb

## 2015-09-20 DIAGNOSIS — Z0000 Encounter for general adult medical examination without abnormal findings: Secondary | ICD-10-CM

## 2015-09-20 NOTE — Telephone Encounter (Signed)
The patient was in for AWV. Blood ordered when seeing Dr. Quay Burow in March but has not completed  CPE generally in the fall.  Asked if he needs blood work now?  Or wait until one year post last labs 12/07/14 and then schedule a CPE of wait until march 18' for CPE?

## 2015-09-20 NOTE — Telephone Encounter (Signed)
Should get blood work relatively soon

## 2015-09-20 NOTE — Patient Instructions (Addendum)
Robert Richard , Thank you for taking time to come for your Medicare Wellness Visit. I appreciate your ongoing commitment to your health goals. Please review the following plan we discussed and let me know if I can assist you in the future.   Will discuss tetanus with Dr. Quay Burow; or defer x 1 year  Flu shot in the fall     These are the goals we discussed: Goals    . patient          Plant a garden;  Network engineer, cucumber; spaghetti squash;  Water melon        This is a list of the screening recommended for you and due dates:  Health Maintenance  Topic Date Due  . Tetanus Vaccine  05/26/1949  . Shingles Vaccine  05/25/1992  . Flu Shot  09/20/2015  . Pneumonia vaccines  Completed       Fall Prevention in the Home  Falls can cause injuries. They can happen to people of all ages. There are many things you can do to make your home safe and to help prevent falls.  WHAT CAN I DO ON THE OUTSIDE OF MY HOME?  Regularly fix the edges of walkways and driveways and fix any cracks.  Remove anything that might make you trip as you walk through a door, such as a raised step or threshold.  Trim any bushes or trees on the path to your home.  Use bright outdoor lighting.  Clear any walking paths of anything that might make someone trip, such as rocks or tools.  Regularly check to see if handrails are loose or broken. Make sure that both sides of any steps have handrails.  Any raised decks and porches should have guardrails on the edges.  Have any leaves, snow, or ice cleared regularly.  Use sand or salt on walking paths during winter.  Clean up any spills in your garage right away. This includes oil or grease spills. WHAT CAN I DO IN THE BATHROOM?   Use night lights.  Install grab bars by the toilet and in the tub and shower. Do not use towel bars as grab bars.  Use non-skid mats or decals in the tub or shower.  If you need to sit down in the shower, use a plastic, non-slip  stool.  Keep the floor dry. Clean up any water that spills on the floor as soon as it happens.  Remove soap buildup in the tub or shower regularly.  Attach bath mats securely with double-sided non-slip rug tape.  Do not have throw rugs and other things on the floor that can make you trip. WHAT CAN I DO IN THE BEDROOM?  Use night lights.  Make sure that you have a light by your bed that is easy to reach.  Do not use any sheets or blankets that are too big for your bed. They should not hang down onto the floor.  Have a firm chair that has side arms. You can use this for support while you get dressed.  Do not have throw rugs and other things on the floor that can make you trip. WHAT CAN I DO IN THE KITCHEN?  Clean up any spills right away.  Avoid walking on wet floors.  Keep items that you use a lot in easy-to-reach places.  If you need to reach something above you, use a strong step stool that has a grab bar.  Keep electrical cords out of the way.  Do not  use floor polish or wax that makes floors slippery. If you must use wax, use non-skid floor wax.  Do not have throw rugs and other things on the floor that can make you trip. WHAT CAN I DO WITH MY STAIRS?  Do not leave any items on the stairs.  Make sure that there are handrails on both sides of the stairs and use them. Fix handrails that are broken or loose. Make sure that handrails are as long as the stairways.  Check any carpeting to make sure that it is firmly attached to the stairs. Fix any carpet that is loose or worn.  Avoid having throw rugs at the top or bottom of the stairs. If you do have throw rugs, attach them to the floor with carpet tape.  Make sure that you have a light switch at the top of the stairs and the bottom of the stairs. If you do not have them, ask someone to add them for you. WHAT ELSE CAN I DO TO HELP PREVENT FALLS?  Wear shoes that:  Do not have high heels.  Have rubber bottoms.  Are  comfortable and fit you well.  Are closed at the toe. Do not wear sandals.  If you use a stepladder:  Make sure that it is fully opened. Do not climb a closed stepladder.  Make sure that both sides of the stepladder are locked into place.  Ask someone to hold it for you, if possible.  Clearly mark and make sure that you can see:  Any grab bars or handrails.  First and last steps.  Where the edge of each step is.  Use tools that help you move around (mobility aids) if they are needed. These include:  Canes.  Walkers.  Scooters.  Crutches.  Turn on the lights when you go into a dark area. Replace any light bulbs as soon as they burn out.  Set up your furniture so you have a clear path. Avoid moving your furniture around.  If any of your floors are uneven, fix them.  If there are any pets around you, be aware of where they are.  Review your medicines with your doctor. Some medicines can make you feel dizzy. This can increase your chance of falling. Ask your doctor what other things that you can do to help prevent falls.   This information is not intended to replace advice given to you by your health care provider. Make sure you discuss any questions you have with your health care provider.   Document Released: 12/02/2008 Document Revised: 06/22/2014 Document Reviewed: 03/12/2014 Elsevier Interactive Patient Education 2016 [place] Maintenance, Male A healthy lifestyle and preventative care can promote health and wellness.  Maintain regular health, dental, and eye exams.  Eat a healthy diet. Foods like vegetables, fruits, whole grains, low-fat dairy products, and lean protein foods contain the nutrients you need and are low in calories. Decrease your intake of foods high in solid fats, added sugars, and salt. Get information about a proper diet from your health care provider, if necessary.  Regular physical exercise is one of the most important things  you can do for your health. Most adults should get at least 150 minutes of moderate-intensity exercise (any activity that increases your heart rate and causes you to sweat) each week. In addition, most adults need muscle-strengthening exercises on 2 or more days a week.   Maintain a healthy weight. The body mass index (BMI) is a screening tool  to identify possible weight problems. It provides an estimate of body fat based on height and weight. Your health care provider can find your BMI and can help you achieve or maintain a healthy weight. For males 20 years and older:  A BMI below 18.5 is considered underweight.  A BMI of 18.5 to 24.9 is normal.  A BMI of 25 to 29.9 is considered overweight.  A BMI of 30 and above is considered obese.  Maintain normal blood lipids and cholesterol by exercising and minimizing your intake of saturated fat. Eat a balanced diet with plenty of fruits and vegetables. Blood tests for lipids and cholesterol should begin at age 54 and be repeated every 5 years. If your lipid or cholesterol levels are high, you are over age 14, or you are at high risk for heart disease, you may need your cholesterol levels checked more frequently.Ongoing high lipid and cholesterol levels should be treated with medicines if diet and exercise are not working.  If you smoke, find out from your health care provider how to quit. If you do not use tobacco, do not start.  Lung cancer screening is recommended for adults aged 22-80 years who are at high risk for developing lung cancer because of a history of smoking. A yearly low-dose CT scan of the lungs is recommended for people who have at least a 30-pack-year history of smoking and are current smokers or have quit within the past 15 years. A pack year of smoking is smoking an average of 1 pack of cigarettes a day for 1 year (for example, a 30-pack-year history of smoking could mean smoking 1 pack a day for 30 years or 2 packs a day for 15  years). Yearly screening should continue until the smoker has stopped smoking for at least 15 years. Yearly screening should be stopped for people who develop a health problem that would prevent them from having lung cancer treatment.  If you choose to drink alcohol, do not have more than 2 drinks per day. One drink is considered to be 12 oz (360 mL) of beer, 5 oz (150 mL) of wine, or 1.5 oz (45 mL) of liquor.  Avoid the use of street drugs. Do not share needles with anyone. Ask for help if you need support or instructions about stopping the use of drugs.  High blood pressure causes heart disease and increases the risk of stroke. High blood pressure is more likely to develop in:  People who have blood pressure in the end of the normal range (100-139/85-89 mm Hg).  People who are overweight or obese.  People who are African American.  If you are 51-81 years of age, have your blood pressure checked every 3-5 years. If you are 31 years of age or older, have your blood pressure checked every year. You should have your blood pressure measured twice--once when you are at a hospital or clinic, and once when you are not at a hospital or clinic. Record the average of the two measurements. To check your blood pressure when you are not at a hospital or clinic, you can use:  An automated blood pressure machine at a pharmacy.  A home blood pressure monitor.  If you are 74-61 years old, ask your health care provider if you should take aspirin to prevent heart disease.  Diabetes screening involves taking a blood sample to check your fasting blood sugar level. This should be done once every 3 years after age 70 if you are  at a normal weight and without risk factors for diabetes. Testing should be considered at a younger age or be carried out more frequently if you are overweight and have at least 1 risk factor for diabetes.  Colorectal cancer can be detected and often prevented. Most routine colorectal  cancer screening begins at the age of 5 and continues through age 40. However, your health care provider may recommend screening at an earlier age if you have risk factors for colon cancer. On a yearly basis, your health care provider may provide home test kits to check for hidden blood in the stool. A small camera at the end of a tube may be used to directly examine the colon (sigmoidoscopy or colonoscopy) to detect the earliest forms of colorectal cancer. Talk to your health care provider about this at age 62 when routine screening begins. A direct exam of the colon should be repeated every 5-10 years through age 51, unless early forms of precancerous polyps or small growths are found.  People who are at an increased risk for hepatitis B should be screened for this virus. You are considered at high risk for hepatitis B if:  You were born in a country where hepatitis B occurs often. Talk with your health care provider about which countries are considered high risk.  Your parents were born in a high-risk country and you have not received a shot to protect against hepatitis B (hepatitis B vaccine).  You have HIV or AIDS.  You use needles to inject street drugs.  You live with, or have sex with, someone who has hepatitis B.  You are a man who has sex with other men (MSM).  You get hemodialysis treatment.  You take certain medicines for conditions like cancer, organ transplantation, and autoimmune conditions.  Hepatitis C blood testing is recommended for all people born from [birth year] through 1963 and any individual with known risk factors for hepatitis C.  Healthy men should no longer receive prostate-specific antigen (PSA) blood tests as part of routine cancer screening. Talk to your health care provider about prostate cancer screening.  Testicular cancer screening is not recommended for adolescents or adult males who have no symptoms. Screening includes self-exam, a health care provider exam, and  other screening tests. Consult with your health care provider about any symptoms you have or any concerns you have about testicular cancer.  Practice safe sex. Use condoms and avoid high-risk sexual practices to reduce the spread of sexually transmitted infections (STIs).  You should be screened for STIs, including gonorrhea and chlamydia if:  You are sexually active and are younger than 24 years.  You are older than 24 years, and your health care provider tells you that you are at risk for this type of infection.  Your sexual activity has changed since you were last screened, and you are at an increased risk for chlamydia or gonorrhea. Ask your health care provider if you are at risk.  If you are at risk of being infected with HIV, it is recommended that you take a prescription medicine daily to prevent HIV infection. This is called pre-exposure prophylaxis (PrEP). You are considered at risk if:  You are a man who has sex with other men (MSM).  You are a heterosexual man who is sexually active with multiple partners.  You take drugs by injection.  You are sexually active with a partner who has HIV.  Talk with your health care provider about whether you are at high risk  of being infected with HIV. If you choose to begin PrEP, you should first be tested for HIV. You should then be tested every 3 months for as long as you are taking PrEP.  Use sunscreen. Apply sunscreen liberally and repeatedly throughout the day. You should seek shade when your shadow is shorter than you. Protect yourself by wearing long sleeves, pants, a wide-brimmed hat, and sunglasses year round whenever you are outdoors.  Tell your health care provider of new moles or changes in moles, especially if there is a change in shape or color. Also, tell your health care provider if a mole is larger than the size of a pencil eraser.  A one-time screening for abdominal aortic aneurysm (AAA) and surgical repair of large AAAs by  ultrasound is recommended for men aged 1-75 years who are current or former smokers.  Stay current with your vaccines (immunizations).   This information is not intended to replace advice given to you by your health care provider. Make sure you discuss any questions you have with your health care provider.   Document Released: 08/04/2007 Document Revised: 02/26/2014 Document Reviewed: 07/03/2010 Elsevier Interactive Patient Education [employer].

## 2015-09-21 NOTE — Telephone Encounter (Signed)
Call to Ms. Mota Stated labs need to be drawn soon; NPO p mn x water Also  Can schedule exam for March for annual  To note; They do not use my chart; labs will need to be called in.

## 2015-09-22 ENCOUNTER — Other Ambulatory Visit (INDEPENDENT_AMBULATORY_CARE_PROVIDER_SITE_OTHER): Payer: Medicare Other

## 2015-09-22 DIAGNOSIS — I4891 Unspecified atrial fibrillation: Secondary | ICD-10-CM

## 2015-09-22 DIAGNOSIS — I2510 Atherosclerotic heart disease of native coronary artery without angina pectoris: Secondary | ICD-10-CM

## 2015-09-22 DIAGNOSIS — E785 Hyperlipidemia, unspecified: Secondary | ICD-10-CM | POA: Diagnosis not present

## 2015-09-22 DIAGNOSIS — I10 Essential (primary) hypertension: Secondary | ICD-10-CM

## 2015-09-22 LAB — CBC WITH DIFFERENTIAL/PLATELET
Basophils Absolute: 0 10*3/uL (ref 0.0–0.1)
Basophils Relative: 0.4 % (ref 0.0–3.0)
Eosinophils Absolute: 0.1 10*3/uL (ref 0.0–0.7)
Eosinophils Relative: 1.4 % (ref 0.0–5.0)
HCT: 34.9 % — ABNORMAL LOW (ref 39.0–52.0)
Hemoglobin: 11.7 g/dL — ABNORMAL LOW (ref 13.0–17.0)
Lymphocytes Relative: 24.2 % (ref 12.0–46.0)
Lymphs Abs: 2.2 10*3/uL (ref 0.7–4.0)
MCHC: 33.6 g/dL (ref 30.0–36.0)
MCV: 85.9 fl (ref 78.0–100.0)
Monocytes Absolute: 1.1 10*3/uL — ABNORMAL HIGH (ref 0.1–1.0)
Monocytes Relative: 12.1 % — ABNORMAL HIGH (ref 3.0–12.0)
Neutro Abs: 5.6 10*3/uL (ref 1.4–7.7)
Neutrophils Relative %: 61.9 % (ref 43.0–77.0)
Platelets: 201 10*3/uL (ref 150.0–400.0)
RBC: 4.06 Mil/uL — ABNORMAL LOW (ref 4.22–5.81)
RDW: 13.6 % (ref 11.5–15.5)
WBC: 9.1 10*3/uL (ref 4.0–10.5)

## 2015-09-22 LAB — COMPREHENSIVE METABOLIC PANEL
ALT: 11 U/L (ref 0–53)
AST: 17 U/L (ref 0–37)
Albumin: 4.1 g/dL (ref 3.5–5.2)
Alkaline Phosphatase: 95 U/L (ref 39–117)
BUN: 21 mg/dL (ref 6–23)
CO2: 22 mEq/L (ref 19–32)
Calcium: 9.7 mg/dL (ref 8.4–10.5)
Chloride: 111 mEq/L (ref 96–112)
Creatinine, Ser: 1.43 mg/dL (ref 0.40–1.50)
GFR: 50.16 mL/min — ABNORMAL LOW (ref >60.00)
Glucose, Bld: 101 mg/dL — ABNORMAL HIGH (ref 70–99)
Potassium: 4.6 mEq/L (ref 3.5–5.1)
Sodium: 141 mEq/L (ref 135–145)
Total Bilirubin: 0.6 mg/dL (ref 0.2–1.2)
Total Protein: 8.3 g/dL (ref 6.0–8.3)

## 2015-09-22 LAB — LIPID PANEL
Cholesterol: 107 mg/dL (ref 0–200)
HDL: 34.2 mg/dL — ABNORMAL LOW (ref >39.00)
LDL Cholesterol: 59 mg/dL (ref 0–99)
NonHDL: 72.99
Total CHOL/HDL Ratio: 3
Triglycerides: 69 mg/dL (ref 0.0–149.0)
VLDL: 13.8 mg/dL (ref 0.0–40.0)

## 2015-09-22 LAB — HEMOGLOBIN A1C: Hgb A1c MFr Bld: 6.1 % (ref 4.6–6.5)

## 2015-09-28 ENCOUNTER — Encounter: Payer: Self-pay | Admitting: Emergency Medicine

## 2015-10-10 ENCOUNTER — Ambulatory Visit (INDEPENDENT_AMBULATORY_CARE_PROVIDER_SITE_OTHER): Payer: Medicare Other | Admitting: *Deleted

## 2015-10-10 DIAGNOSIS — Z5181 Encounter for therapeutic drug level monitoring: Secondary | ICD-10-CM

## 2015-10-10 DIAGNOSIS — Z954 Presence of other heart-valve replacement: Secondary | ICD-10-CM

## 2015-10-10 DIAGNOSIS — I4891 Unspecified atrial fibrillation: Secondary | ICD-10-CM

## 2015-10-10 DIAGNOSIS — Z952 Presence of prosthetic heart valve: Secondary | ICD-10-CM

## 2015-10-10 LAB — POCT INR: INR: 3.1

## 2015-11-07 ENCOUNTER — Ambulatory Visit (INDEPENDENT_AMBULATORY_CARE_PROVIDER_SITE_OTHER): Payer: Medicare Other | Admitting: Pharmacist

## 2015-11-07 DIAGNOSIS — I4891 Unspecified atrial fibrillation: Secondary | ICD-10-CM | POA: Diagnosis not present

## 2015-11-07 DIAGNOSIS — Z5181 Encounter for therapeutic drug level monitoring: Secondary | ICD-10-CM

## 2015-11-07 DIAGNOSIS — Z952 Presence of prosthetic heart valve: Secondary | ICD-10-CM

## 2015-11-07 DIAGNOSIS — Z954 Presence of other heart-valve replacement: Secondary | ICD-10-CM | POA: Diagnosis not present

## 2015-11-07 LAB — POCT INR: INR: 3

## 2015-11-18 ENCOUNTER — Ambulatory Visit (INDEPENDENT_AMBULATORY_CARE_PROVIDER_SITE_OTHER): Payer: Medicare Other | Admitting: Internal Medicine

## 2015-11-18 ENCOUNTER — Encounter: Payer: Self-pay | Admitting: Internal Medicine

## 2015-11-18 VITALS — BP 142/72 | HR 71 | Ht 73.0 in | Wt 147.0 lb

## 2015-11-18 DIAGNOSIS — Z954 Presence of other heart-valve replacement: Secondary | ICD-10-CM

## 2015-11-18 DIAGNOSIS — I25810 Atherosclerosis of coronary artery bypass graft(s) without angina pectoris: Secondary | ICD-10-CM

## 2015-11-18 DIAGNOSIS — Z952 Presence of prosthetic heart valve: Secondary | ICD-10-CM

## 2015-11-18 NOTE — Patient Instructions (Signed)
Your physician recommends that you continue on your current medications as directed. Please refer to the Current Medication list given to you today. Your physician wants you to follow-up in: 9 months with Dr. Ross.  You will receive a reminder letter in the mail two months in advance. If you don't receive a letter, please call our office to schedule the follow-up appointment.  

## 2015-11-18 NOTE — Progress Notes (Signed)
Cardiology Office Note   Date:  11/18/2015   ID:  Robert Richard, DOB 1930-08-05, MRN [id number]  PCP:  [name], MD  Cardiologist:   Dorris Carnes, MD   F?U of CAD     History of Present Illness: Robert Richard is a 82 y.o. male with a history of  CAD, s/p CABG x 3 (LIMA to LAD; SVG to OM1; SVG to PDA) and AVR (#23 mm [facility] Ease pericardial valve. Post op course long/complicated. 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  I saw the pt back in January  HR was good at time   He denies CP  Breathing is OK       Outpatient Medications Prior to Visit  Medication Sig Dispense Refill  . busPIRone (BUSPAR) 15 MG tablet TAKE ONE-HALF TABLET BY MOUTH TWICE A DAY 90 tablet 1  . diltiazem (CARDIZEM CD) 120 MG 24 hr capsule Take 1 capsule (120 mg total) by mouth daily. Take in addition to 240 mg tablet daily 30 capsule 5  . diltiazem (CARDIZEM CD) 240 MG 24 hr capsule Take 1 capsule (240 mg total) by mouth daily. 30 capsule 6  . ketotifen (ZADITOR) 0.025 % ophthalmic solution Place 1 drop into both eyes daily.     . Lidocaine HCl 4 % SOLN Apply 4 mLs topically as needed (for migraines).     . Multiple Vitamins-Minerals (DAILY MULTIVITAMIN) CAPS Take 1 capsule by mouth daily.     . nitroGLYCERIN (NITROSTAT) 0.4 MG SL tablet Place 0.4 mg under the tongue every 5 (five) minutes as needed for chest pain.    . polyethylene glycol (MIRALAX / GLYCOLAX) packet Take 17 g by mouth daily. 14 each 0  . pravastatin (PRAVACHOL) 40 MG tablet TAKE ONE (1) TABLET EACH DAY 90 tablet 3  . ranitidine (ZANTAC) 150 MG tablet Take 1 tablet (150 mg total) by mouth 2 (two) times daily. 180 tablet 3  . topiramate (TOPAMAX) 25 MG tablet Take 75 mg by mouth daily.    Marland Kitchen warfarin (COUMADIN) 2 MG tablet TAKE 1 TABLET (2MG ) BY MOUTH AS DIRECTED 40 tablet 2   No  facility-administered medications prior to visit.      Allergies:   Ivp dye [iodinated diagnostic agents]   Past Medical History:  Diagnosis Date  . Allergic rhinitis   . Anemia   . Arthritis    SHOULDER  . Asthma with bronchitis   . Colon polyp   . COPD (chronic obstructive pulmonary disease) (HCC)    Astmatic bronchitis  . Dysrhythmia 08/2012   NEW ONSET ATRIAL FIBRILATION  . Gout    PMH  . Headache(784.0)   . HLD (hyperlipidemia)   . Nephrolithiasis   . Osteoporosis   . RLS (restless legs syndrome)   . Skin cancer (melanoma) ([place]) 2012   Right neck  . Skin cancer, basal cell    Dr Nevada Crane, [facility]    Past Surgical History:  Procedure Laterality Date  . AORTIC VALVE REPLACEMENT N/A 02/20/2013   Procedure: AORTIC VALVE REPLACEMENT (AVR);  Surgeon: Ivin Poot, MD;  Location: [place];  Service: Open Heart Surgery;  Laterality: N/A;  . COLONOSCOPY W/ POLYPECTOMY     Dr Deatra Ina  . CORONARY ARTERY BYPASS GRAFT N/A 02/20/2013   Procedure: CORONARY ARTERY BYPASS GRAFTING (CABG);  Surgeon: Ivin Poot, MD;  Location: [facility] OR;  Service: Open Heart Surgery;  Laterality: N/A;  . FINGER SURGERY     for foreign body  . INGUINAL HERNIA REPAIR    . INTRAOPERATIVE TRANSESOPHAGEAL ECHOCARDIOGRAM N/A 02/20/2013   Procedure: INTRAOPERATIVE TRANSESOPHAGEAL ECHOCARDIOGRAM;  Surgeon: Ivin Poot, MD;  Location: [place];  Service: Open Heart Surgery;  Laterality: N/A;  . LEFT AND RIGHT HEART CATHETERIZATION WITH CORONARY ANGIOGRAM N/A 01/05/2013   Procedure: LEFT AND RIGHT HEART CATHETERIZATION WITH CORONARY ANGIOGRAM;  Surgeon: Blane Ohara, MD;  Location: [facility] CATH LAB;  Service: Cardiovascular;  Laterality: N/A;  . LEG SURGERY  06/2010    Dr.Lawson, for blood clott. Left leg   . NECK SURGERY  12/2010   Melanoma ; [facility]   . NOSE SURGERY     Septal Deviation  . RIGHT HEART CATHETERIZATION N/A 01/23/2013   Procedure: RIGHT HEART CATH;  Surgeon: Jolaine Artist, MD;  Location: [facility]  CATH LAB;  Service: Cardiovascular;  Laterality: N/A;  . TEE WITHOUT CARDIOVERSION N/A 01/23/2013   Procedure: TRANSESOPHAGEAL ECHOCARDIOGRAM (TEE);  Surgeon: Jolaine Artist, MD;  Location: [facility] ENDOSCOPY;  Service: Cardiovascular;  Laterality: N/A;  sign heart cath consent w/tee consent     Social History:  The patient  reports that he quit smoking about 54 years ago. His smoking use included Cigarettes. He has a 45.00 pack-year smoking history. He has never used smokeless tobacco. He reports that he does not drink alcohol or use drugs.   Family History:  The patient's family history includes Allergies in his brother, father, mother, and sister; Allergy (severe) in his brother, brother, father, mother, and sister; Asthma in his father; Cancer in his son; Coronary artery disease in his mother; Diabetes in his brother; Emphysema in his brother; Heart attack (age of onset: 60) in his father; Stroke in his father.    ROS:  Please see the history of present illness. All other systems are reviewed and  Negative to the above problem except as noted.    PHYSICAL EXAM: VS:  BP (!) 142/72   Pulse 71   Ht 6\' 1"  (1.854 m)   Wt 147 lb (66.7 kg)   SpO2 96%   BMI 19.39 kg/m   GEN: Well nourished, well developed, in no acute distress  HEENT: normal  Neck: no JVD, carotid bruits, or masses Cardiac: RRR; no murmurs, rubs, or gallops,no edema  Respiratory:  clear to auscultation bilaterally, normal work of breathing GI: soft, nontender, nondistended, + BS  No hepatomegaly  MS: no deformity Moving all extremities   Skin: warm and dry, no rash Neuro:  Strength and sensation are intact Psych: euthymic mood, full affect   EKG:  EKG is not  ordered today.   Lipid Panel    Component Value Date/Time   CHOL 107 09/22/2015 0952   TRIG 69.0 09/22/2015 0952   HDL 34.20 (L) 09/22/2015 0952   CHOLHDL 3 09/22/2015 0952   VLDL 13.8 09/22/2015 0952   LDLCALC 59 09/22/2015 0952      Wt Readings from  Last 3 Encounters:  11/18/15 147 lb (66.7 kg)  09/20/15 148 lb (67.1 kg)  04/26/15 146 lb (66.2 kg)      ASSESSMENT AND PLAN:  1CAD No symptoms to sugg angina  Keep on same regimen  2  Atrial fib  WOuld continue anticoagulation.    3  HTN  BP is adequately controlled  Continue to follow    Signed, Dorris Carnes, MD  11/18/2015 4:30 PM  [facility] [address], [place], [place]  [address] Phone: [phone]; Fax: [phone]

## 2015-11-22 ENCOUNTER — Ambulatory Visit (INDEPENDENT_AMBULATORY_CARE_PROVIDER_SITE_OTHER): Payer: Medicare Other

## 2015-11-22 DIAGNOSIS — Z23 Encounter for immunization: Secondary | ICD-10-CM

## 2015-12-05 ENCOUNTER — Ambulatory Visit (INDEPENDENT_AMBULATORY_CARE_PROVIDER_SITE_OTHER): Payer: Medicare Other

## 2015-12-05 DIAGNOSIS — I4891 Unspecified atrial fibrillation: Secondary | ICD-10-CM | POA: Diagnosis not present

## 2015-12-05 DIAGNOSIS — Z952 Presence of prosthetic heart valve: Secondary | ICD-10-CM | POA: Diagnosis not present

## 2015-12-05 DIAGNOSIS — Z5181 Encounter for therapeutic drug level monitoring: Secondary | ICD-10-CM | POA: Diagnosis not present

## 2015-12-05 LAB — POCT INR: INR: 2.8

## 2015-12-27 ENCOUNTER — Other Ambulatory Visit: Payer: Self-pay | Admitting: Internal Medicine

## 2015-12-27 MED ORDER — DILTIAZEM HCL ER COATED BEADS 240 MG PO CP24: 240.0000 mg | ORAL_CAPSULE | Freq: Every day | ORAL | 9 refills | Status: DC

## 2015-12-28 ENCOUNTER — Telehealth: Payer: Self-pay | Admitting: Internal Medicine

## 2015-12-28 NOTE — Telephone Encounter (Signed)
Returned call to [facility] with [place] informed her of the pt's current dose of Warfarin/Coumadin.  Advised that the pt takes 1 tablet to 1 1/2 tablets daily & informed her that the dose changes dependent upon INR & she verbalized understanding & will use information for insurance purposes.

## 2015-12-28 NOTE — Telephone Encounter (Signed)
Melissa from [facility] called, she states would like to have the actual directions of how pt is to take the coumadin for insurance purposes. Lenna Sciara is aware that this message will be send to the coumadin clinic for reviewing.

## 2015-12-28 NOTE — Telephone Encounter (Signed)
New Message  Melissa from Pharmacy voiced she has questions about the escript/prescription that was sent over yesterday.  Please f/u

## 2016-01-02 ENCOUNTER — Ambulatory Visit (INDEPENDENT_AMBULATORY_CARE_PROVIDER_SITE_OTHER): Payer: Medicare Other | Admitting: *Deleted

## 2016-01-02 DIAGNOSIS — I4891 Unspecified atrial fibrillation: Secondary | ICD-10-CM | POA: Diagnosis not present

## 2016-01-02 DIAGNOSIS — Z5181 Encounter for therapeutic drug level monitoring: Secondary | ICD-10-CM | POA: Diagnosis not present

## 2016-01-02 DIAGNOSIS — Z952 Presence of prosthetic heart valve: Secondary | ICD-10-CM | POA: Diagnosis not present

## 2016-01-02 LAB — POCT INR: INR: 3

## 2016-02-06 ENCOUNTER — Ambulatory Visit (INDEPENDENT_AMBULATORY_CARE_PROVIDER_SITE_OTHER): Payer: Medicare Other | Admitting: *Deleted

## 2016-02-06 DIAGNOSIS — Z952 Presence of prosthetic heart valve: Secondary | ICD-10-CM | POA: Diagnosis not present

## 2016-02-06 DIAGNOSIS — I4891 Unspecified atrial fibrillation: Secondary | ICD-10-CM | POA: Diagnosis not present

## 2016-02-06 DIAGNOSIS — Z5181 Encounter for therapeutic drug level monitoring: Secondary | ICD-10-CM | POA: Diagnosis not present

## 2016-02-06 LAB — POCT INR: INR: 3.5

## 2016-02-22 ENCOUNTER — Ambulatory Visit (INDEPENDENT_AMBULATORY_CARE_PROVIDER_SITE_OTHER): Payer: Medicare Other | Admitting: *Deleted

## 2016-02-22 DIAGNOSIS — Z952 Presence of prosthetic heart valve: Secondary | ICD-10-CM | POA: Diagnosis not present

## 2016-02-22 DIAGNOSIS — Z5181 Encounter for therapeutic drug level monitoring: Secondary | ICD-10-CM | POA: Diagnosis not present

## 2016-02-22 DIAGNOSIS — I4891 Unspecified atrial fibrillation: Secondary | ICD-10-CM

## 2016-02-22 LAB — POCT INR: INR: 2.9

## 2016-03-02 ENCOUNTER — Other Ambulatory Visit: Payer: Self-pay | Admitting: Internal Medicine

## 2016-03-10 ENCOUNTER — Other Ambulatory Visit: Payer: Self-pay | Admitting: Internal Medicine

## 2016-03-14 ENCOUNTER — Ambulatory Visit (INDEPENDENT_AMBULATORY_CARE_PROVIDER_SITE_OTHER): Payer: Medicare Other | Admitting: *Deleted

## 2016-03-14 DIAGNOSIS — I4891 Unspecified atrial fibrillation: Secondary | ICD-10-CM | POA: Diagnosis not present

## 2016-03-14 DIAGNOSIS — Z5181 Encounter for therapeutic drug level monitoring: Secondary | ICD-10-CM

## 2016-03-14 DIAGNOSIS — Z952 Presence of prosthetic heart valve: Secondary | ICD-10-CM | POA: Diagnosis not present

## 2016-03-14 LAB — POCT INR: INR: 3.2

## 2016-04-04 ENCOUNTER — Ambulatory Visit (INDEPENDENT_AMBULATORY_CARE_PROVIDER_SITE_OTHER): Payer: Medicare Other | Admitting: *Deleted

## 2016-04-04 DIAGNOSIS — I4891 Unspecified atrial fibrillation: Secondary | ICD-10-CM | POA: Diagnosis not present

## 2016-04-04 DIAGNOSIS — Z5181 Encounter for therapeutic drug level monitoring: Secondary | ICD-10-CM | POA: Diagnosis not present

## 2016-04-04 DIAGNOSIS — Z952 Presence of prosthetic heart valve: Secondary | ICD-10-CM

## 2016-04-04 LAB — POCT INR: INR: 1.7

## 2016-04-25 ENCOUNTER — Ambulatory Visit (INDEPENDENT_AMBULATORY_CARE_PROVIDER_SITE_OTHER): Payer: Medicare Other | Admitting: *Deleted

## 2016-04-25 DIAGNOSIS — Z952 Presence of prosthetic heart valve: Secondary | ICD-10-CM | POA: Diagnosis not present

## 2016-04-25 DIAGNOSIS — I4891 Unspecified atrial fibrillation: Secondary | ICD-10-CM

## 2016-04-25 DIAGNOSIS — Z5181 Encounter for therapeutic drug level monitoring: Secondary | ICD-10-CM

## 2016-04-25 LAB — POCT INR: INR: 2.7

## 2016-04-26 ENCOUNTER — Telehealth: Payer: Self-pay | Admitting: Internal Medicine

## 2016-04-26 ENCOUNTER — Other Ambulatory Visit: Payer: Self-pay | Admitting: Internal Medicine

## 2016-04-26 NOTE — Telephone Encounter (Signed)
Returned call to pharmacy to clarify Coumadin instructions.

## 2016-04-26 NOTE — Telephone Encounter (Signed)
Melissa, Pharmacist at [facility] and calling about patient's Warfarin medication. She would like to know if Dr. Harrington Challenger would like to keep the directions the same. Thanks.

## 2016-05-01 ENCOUNTER — Encounter: Payer: Self-pay | Admitting: Internal Medicine

## 2016-05-01 ENCOUNTER — Ambulatory Visit (INDEPENDENT_AMBULATORY_CARE_PROVIDER_SITE_OTHER): Payer: Medicare Other | Admitting: Internal Medicine

## 2016-05-01 VITALS — BP 130/68 | HR 72 | Temp 97.5°F | Resp 16 | Ht 73.0 in | Wt 147.0 lb

## 2016-05-01 DIAGNOSIS — N289 Disorder of kidney and ureter, unspecified: Secondary | ICD-10-CM

## 2016-05-01 DIAGNOSIS — M79604 Pain in right leg: Secondary | ICD-10-CM

## 2016-05-01 DIAGNOSIS — E7800 Pure hypercholesterolemia, unspecified: Secondary | ICD-10-CM

## 2016-05-01 DIAGNOSIS — D649 Anemia, unspecified: Secondary | ICD-10-CM

## 2016-05-01 DIAGNOSIS — I10 Essential (primary) hypertension: Secondary | ICD-10-CM

## 2016-05-01 DIAGNOSIS — M810 Age-related osteoporosis without current pathological fracture: Secondary | ICD-10-CM

## 2016-05-01 DIAGNOSIS — G43809 Other migraine, not intractable, without status migrainosus: Secondary | ICD-10-CM

## 2016-05-01 DIAGNOSIS — R7303 Prediabetes: Secondary | ICD-10-CM

## 2016-05-01 DIAGNOSIS — H9193 Unspecified hearing loss, bilateral: Secondary | ICD-10-CM | POA: Diagnosis not present

## 2016-05-01 DIAGNOSIS — I4891 Unspecified atrial fibrillation: Secondary | ICD-10-CM

## 2016-05-01 DIAGNOSIS — I2510 Atherosclerotic heart disease of native coronary artery without angina pectoris: Secondary | ICD-10-CM | POA: Diagnosis not present

## 2016-05-01 DIAGNOSIS — M79605 Pain in left leg: Secondary | ICD-10-CM

## 2016-05-01 DIAGNOSIS — Z0000 Encounter for general adult medical examination without abnormal findings: Secondary | ICD-10-CM | POA: Diagnosis not present

## 2016-05-01 DIAGNOSIS — E119 Type 2 diabetes mellitus without complications: Secondary | ICD-10-CM | POA: Insufficient documentation

## 2016-05-01 MED ORDER — BUSPIRONE HCL 10 MG PO TABS: 20.0000 mg | ORAL_TABLET | Freq: Two times a day (BID) | ORAL | 5 refills | Status: DC

## 2016-05-01 NOTE — Assessment & Plan Note (Signed)
Referred to audiology

## 2016-05-01 NOTE — Assessment & Plan Note (Signed)
Controlled with topamax

## 2016-05-01 NOTE — Assessment & Plan Note (Signed)
With walking, better at rest ? Vascular or neurological Discussed PT - deferred today Refer to neuro ABI's ordered

## 2016-05-01 NOTE — Patient Instructions (Signed)
Test(s) ordered today. Your results will be released to [place] (or called to you) after review, usually within 72hours after test completion. If any changes need to be made, you will be notified at that same time.  All other Health Maintenance issues reviewed.   All recommended immunizations and age-appropriate screenings are up-to-date or discussed.  No immunizations administered today.   Medications reviewed and updated.  Changes include increase buspirone to 20 mg twice daily.  Your prescription(s) have been submitted to your pharmacy. Please take as directed and contact our office if you believe you are having problem(s) with the medication(s).  A vascular study to assess your circulation in your legs was ordered.  A Dexa was ordered.  A referral was ordered for neurology, audiology.  Please followup in 6 months    Health Maintenance, Male A healthy lifestyle and preventive care is important for your health and wellness. Ask your health care provider about what schedule of regular examinations is right for you. What should I know about weight and diet?  Eat a Healthy Diet  Eat plenty of vegetables, fruits, whole grains, low-fat dairy products, and lean protein.  Do not eat a lot of foods high in solid fats, added sugars, or salt. Maintain a Healthy Weight  Regular exercise can help you achieve or maintain a healthy weight. You should:  Do at least 150 minutes of exercise each week. The exercise should increase your heart rate and make you sweat (moderate-intensity exercise).  Do strength-training exercises at least twice a week. Watch Your Levels of Cholesterol and Blood Lipids  Have your blood tested for lipids and cholesterol every 5 years starting at 81 years of age. If you are at high risk for heart disease, you should start having your blood tested when you are 81 years old. You may need to have your cholesterol levels checked more often if:  Your lipid or cholesterol  levels are high.  You are older than 81 years of age.  You are at high risk for heart disease. What should I know about cancer screening? Many types of cancers can be detected early and may often be prevented. Lung Cancer  You should be screened every year for lung cancer if:  You are a current smoker who has smoked for at least 30 years.  You are a former smoker who has quit within the past 15 years.  Talk to your health care provider about your screening options, when you should start screening, and how often you should be screened. Colorectal Cancer  Routine colorectal cancer screening usually begins at 81 years of age and should be repeated every 5-10 years until you are 81 years old. You may need to be screened more often if early forms of precancerous polyps or small growths are found. Your health care provider may recommend screening at an earlier age if you have risk factors for colon cancer.  Your health care provider may recommend using home test kits to check for hidden blood in the stool.  A small camera at the end of a tube can be used to examine your colon (sigmoidoscopy or colonoscopy). This checks for the earliest forms of colorectal cancer. Prostate and Testicular Cancer  Depending on your age and overall health, your health care provider may do certain tests to screen for prostate and testicular cancer.  Talk to your health care provider about any symptoms or concerns you have about testicular or prostate cancer. Skin Cancer  Check your skin from  head to toe regularly.  Tell your health care provider about any new moles or changes in moles, especially if:  There is a change in a mole's size, shape, or color.  You have a mole that is larger than a pencil eraser.  Always use sunscreen. Apply sunscreen liberally and repeat throughout the day.  Protect yourself by wearing long sleeves, pants, a wide-brimmed hat, and sunglasses when outside. What should I know  about heart disease, diabetes, and high blood pressure?  If you are 12-45 years of age, have your blood pressure checked every 3-5 years. If you are 12 years of age or older, have your blood pressure checked every year. You should have your blood pressure measured twice-once when you are at a hospital or clinic, and once when you are not at a hospital or clinic. Record the average of the two measurements. To check your blood pressure when you are not at a hospital or clinic, you can use:  An automated blood pressure machine at a pharmacy.  A home blood pressure monitor.  Talk to your health care provider about your target blood pressure.  If you are between 53-68 years old, ask your health care provider if you should take aspirin to prevent heart disease.  Have regular diabetes screenings by checking your fasting blood sugar level.  If you are at a normal weight and have a low risk for diabetes, have this test once every three years after the age of 41.  If you are overweight and have a high risk for diabetes, consider being tested at a younger age or more often.  A one-time screening for abdominal aortic aneurysm (AAA) by ultrasound is recommended for men aged 62-75 years who are current or former smokers. What should I know about preventing infection? Hepatitis B  If you have a higher risk for hepatitis B, you should be screened for this virus. Talk with your health care provider to find out if you are at risk for hepatitis B infection. Hepatitis C  Blood testing is recommended for:  Everyone born from [birth year] through 1963.  Anyone with known risk factors for hepatitis C. Sexually Transmitted Diseases (STDs)  You should be screened each year for STDs including gonorrhea and chlamydia if:  You are sexually active and are younger than 81 years of age.  You are older than 81 years of age and your health care provider tells you that you are at risk for this type of infection.  Your  sexual activity has changed since you were last screened and you are at an increased risk for chlamydia or gonorrhea. Ask your health care provider if you are at risk.  Talk with your health care provider about whether you are at high risk of being infected with HIV. Your health care provider may recommend a prescription medicine to help prevent HIV infection. What else can I do?  Schedule regular health, dental, and eye exams.  Stay current with your vaccines (immunizations).  Do not use any tobacco products, such as cigarettes, chewing tobacco, and e-cigarettes. If you need help quitting, ask your health care provider.  Limit alcohol intake to no more than 2 drinks per day. One drink equals 12 ounces of beer, 5 ounces of wine, or 1 ounces of hard liquor.  Do not use street drugs.  Do not share needles.  Ask your health care provider for help if you need support or information about quitting drugs.  Tell your health care provider if  you often feel depressed.  Tell your health care provider if you have ever been abused or do not feel safe at home. This information is not intended to replace advice given to you by your health care provider. Make sure you discuss any questions you have with your health care provider. Document Released: 08/04/2007 Document Revised: 10/05/2015 Document Reviewed: 11/09/2014 Elsevier Interactive Patient Education  2017 [employer].

## 2016-05-01 NOTE — Progress Notes (Signed)
Pre visit review using our clinic review tool, if applicable. No additional management support is needed unless otherwise documented below in the visit note. 

## 2016-05-01 NOTE — Assessment & Plan Note (Signed)
dexa ordered Check vitamin d level - taking a MVI daily Encouraged regular walking,

## 2016-05-01 NOTE — Assessment & Plan Note (Signed)
cmp

## 2016-05-01 NOTE — Assessment & Plan Note (Signed)
BP well controlled Current regimen effective and well tolerated Continue current medications at current doses  

## 2016-05-01 NOTE — Progress Notes (Signed)
Subjective:    Patient ID: Robert Richard, male    DOB: 20-Sep-1930, 83 y.o.   MRN: [id number]  HPI He is here for a physical exam.   OP:  He was on fosamax in the past and had reclast at least once.  His last dexa was a while ago. He is not exercising regularly.    B/l leg pain - from knees down.  It hurts when he walks.  It feels like a cramping sensation.  Some numbness/tingling.  Stopping walking helps the pain. It started after this open hart surgery.  He has occ back pain, but it is minimal.  Sometimes he has a burning sensation in his feet.  When he walks he shuffles his feet per his wife. He is not a good historian and further history is difficult to obtain.  He denies falls.   His wife feels his anxiety in not well controlled - he feels it is.  She wonders about increasing the buspar.   Medications and allergies reviewed with patient and updated if appropriate.  Patient Active Problem List   Diagnosis Date Noted  . Prediabetes 05/01/2016  . Migraine 04/26/2015  . Aortic valve replaced 04/21/2015  . Subacute confusional state 12/14/2014  . Dyspnea 09/16/2013  . Throat pain 08/19/2013  . Pulmonary infiltrates 07/31/2013  . HCAP (healthcare-associated pneumonia) 03/23/2013  . Encounter for therapeutic drug monitoring 03/20/2013  . CAD (coronary artery disease) 03/13/2013  . Atrial fibrillation, controlled ([place]) 03/13/2013  . Protein-calorie malnutrition, severe ([place]) 02/27/2013  . S/P CABG x 3 02/20/2013  . History of embolectomy 09/22/2012  . Renal insufficiency, mild 09/22/2012  . Syncope 09/15/2012  . Peripheral vascular disease, unspecified 07/08/2012  . Mitral stenosis 07/27/2010  . Hyperlipidemia 10/25/2009  . Essential hypertension 10/25/2009  . NEPHROLITHIASIS, HX OF 10/25/2009  . SLEEP DISORDER 11/29/2008  . SNORING 10/14/2008  . FATIGUE 10/05/2008  . APNEA 10/05/2008  . SKIN CANCER, HX OF 10/05/2008  . RESTLESS LEG SYNDROME 09/11/2007  . Allergic  rhinitis, cause unspecified 09/11/2007  . GOUT 03/15/2006  . ANEMIA-NOS 03/15/2006  . ASTHMA 03/15/2006  . COPD 03/15/2006  . OSTEOPOROSIS 03/15/2006  . COLONIC POLYPS, HX OF 03/15/2006    Current Outpatient Prescriptions on File Prior to Visit  Medication Sig Dispense Refill  . busPIRone (BUSPAR) 15 MG tablet TAKE ONE-HALF TABLET BY MOUTH TWICE A DAY. 90 tablet 0  . diltiazem (CARDIZEM CD) 120 MG 24 hr capsule TAKE 1 CAPSULE (120 MG TOTAL) BY MOUTH DAILY. TAKE IN ADDITION TO 240MG  DAILY 30 capsule 7  . diltiazem (CARDIZEM CD) 240 MG 24 hr capsule Take 1 capsule (240 mg total) by mouth daily. 30 capsule 6  . ketotifen (ZADITOR) 0.025 % ophthalmic solution Place 1 drop into both eyes daily.     . Lidocaine HCl 4 % SOLN Apply 4 mLs topically as needed (for migraines).     . Multiple Vitamins-Minerals (DAILY MULTIVITAMIN) CAPS Take 1 capsule by mouth daily.     . nitroGLYCERIN (NITROSTAT) 0.4 MG SL tablet Place 0.4 mg under the tongue every 5 (five) minutes as needed for chest pain.    . polyethylene glycol (MIRALAX / GLYCOLAX) packet Take 17 g by mouth daily. 14 each 0  . pravastatin (PRAVACHOL) 40 MG tablet TAKE ONE (1) TABLET EACH DAY 90 tablet 3  . ranitidine (ZANTAC) 150 MG tablet Take 1 tablet (150 mg total) by mouth 2 (two) times daily. 180 tablet 3  . topiramate (TOPAMAX) 25 MG  tablet Take 75 mg by mouth daily.    Marland Kitchen warfarin (COUMADIN) 2 MG tablet Take as directed by [facility] 40 tablet 3   No current facility-administered medications on file prior to visit.     Past Medical History:  Diagnosis Date  . Allergic rhinitis   . Anemia   . Arthritis    SHOULDER  . Asthma with bronchitis   . Colon polyp   . COPD (chronic obstructive pulmonary disease) (HCC)    Astmatic bronchitis  . Dysrhythmia 08/2012   NEW ONSET ATRIAL FIBRILATION  . Gout    PMH  . Headache(784.0)   . HLD (hyperlipidemia)   . Nephrolithiasis   . Osteoporosis   . RLS (restless legs syndrome)   .  Skin cancer (melanoma) ([place]) 2012   Right neck  . Skin cancer, basal cell    Dr Nevada Crane, [facility]    Past Surgical History:  Procedure Laterality Date  . AORTIC VALVE REPLACEMENT N/A 02/20/2013   Procedure: AORTIC VALVE REPLACEMENT (AVR);  Surgeon: Ivin Poot, MD;  Location: [place];  Service: Open Heart Surgery;  Laterality: N/A;  . COLONOSCOPY W/ POLYPECTOMY     Dr Deatra Ina  . CORONARY ARTERY BYPASS GRAFT N/A 02/20/2013   Procedure: CORONARY ARTERY BYPASS GRAFTING (CABG);  Surgeon: Ivin Poot, MD;  Location: [place];  Service: Open Heart Surgery;  Laterality: N/A;  . FINGER SURGERY     for foreign body  . INGUINAL HERNIA REPAIR    . INTRAOPERATIVE TRANSESOPHAGEAL ECHOCARDIOGRAM N/A 02/20/2013   Procedure: INTRAOPERATIVE TRANSESOPHAGEAL ECHOCARDIOGRAM;  Surgeon: Ivin Poot, MD;  Location: [place];  Service: Open Heart Surgery;  Laterality: N/A;  . LEFT AND RIGHT HEART CATHETERIZATION WITH CORONARY ANGIOGRAM N/A 01/05/2013   Procedure: LEFT AND RIGHT HEART CATHETERIZATION WITH CORONARY ANGIOGRAM;  Surgeon: Blane Ohara, MD;  [facility] LAB;  Service: Cardiovascular;  Laterality: N/A;  . LEG SURGERY  06/2010    Dr.Lawson, for blood clott. Left leg   . NECK SURGERY  12/2010   Melanoma ; [facility]   . NOSE SURGERY     Septal Deviation  . RIGHT HEART CATHETERIZATION N/A 01/23/2013   Procedure: RIGHT HEART CATH;  Surgeon: Jolaine Artist, MD;  Location: [facility] CATH LAB;  Service: Cardiovascular;  Laterality: N/A;  . TEE WITHOUT CARDIOVERSION N/A 01/23/2013   Procedure: TRANSESOPHAGEAL ECHOCARDIOGRAM (TEE);  Surgeon: Jolaine Artist, MD;  [facility] END[facility]COPY;  Service: Cardiovascular;  Laterality: N/A;  sign heart cath consent w/tee consent    Social History   Social History  . Marital status: Married    Spouse name: N/A  . Number of children: N/A  . Years of education: N/A   Occupational History  . retired Astronomer [employer]   Social History Main Topics  . Smoking  status: Former Smoker    Packs/day: 3.00    Years: 15.00    Types: Cigarettes    Quit date: 02/19/1961  . Smokeless tobacco: Never Used  . Alcohol use No  . Drug use: No  . Sexual activity: Not Asked   Other Topics Concern  . None   Social History Narrative  . None    Family History  Problem Relation Age of Onset  . Allergies Mother   . Coronary artery disease Mother   . Allergy (severe) Mother   . Allergies Father   . Stroke Father   . Heart attack Father 16  . Asthma Father   . Allergy (severe) Father   .  Allergies Sister   . Allergy (severe) Sister   . Allergies Brother     x2  . Allergy (severe) Brother   . Diabetes Brother   . Allergy (severe) Brother   . Emphysema Brother     smoked  . Cancer Son     Review of Systems  Constitutional: Negative for appetite change, chills, fatigue and fever.  HENT: Positive for postnasal drip.   Eyes: Negative for visual disturbance.  Respiratory: Positive for cough (takes mucinex, zyrtec). Negative for shortness of breath and wheezing.   Cardiovascular: Negative for chest pain, palpitations and leg swelling.  Gastrointestinal: Positive for constipation (miralax - controlled). Negative for abdominal pain, blood in stool, diarrhea and nausea.       No gerd  Genitourinary: Negative for dysuria and hematuria.  Musculoskeletal: Positive for back pain (intermittent). Negative for arthralgias and myalgias.  Neurological: Positive for weakness and numbness (? in feet). Negative for dizziness, light-headedness and headaches.  Psychiatric/Behavioral: Negative for dysphoric mood and sleep disturbance. The patient is nervous/anxious.        Objective:   Vitals:   05/01/16 1400  BP: 130/68  Pulse: 72  Resp: 16  Temp: 97.5 F (36.4 C)   Filed Weights   05/01/16 1400  Weight: 147 lb (66.7 kg)   Body mass index is 19.39 kg/m.  Wt Readings from Last 3 Encounters:  05/01/16 147 lb (66.7 kg)  11/18/15 147 lb (66.7 kg)    09/20/15 148 lb (67.1 kg)     Physical Exam Constitutional: He appears well-developed and well-nourished. No distress.  HENT:  Head: Normocephalic and atraumatic.  Right Ear: External ear normal.  Left Ear: External ear normal.  Mouth/Throat: Oropharynx is clear and moist.  Normal ear canals and TM b/l  Eyes: Conjunctivae and EOM are normal.  Neck: Neck supple. No tracheal deviation present. No thyromegaly present.  No carotid bruit  Cardiovascular: Normal rate, regular rhythm, normal heart sounds and intact distal pulses.   No murmur heard. Pulmonary/Chest: Effort normal and breath sounds normal. No respiratory distress. He has no wheezes. He has no rales.  Abdominal: Soft. Bowel sounds are normal. He exhibits no distension. There is no tenderness.  Genitourinary: deferred  Musculoskeletal: He exhibits no edema.  Lymphadenopathy:    He has no cervical adenopathy.  Skin: Skin is warm and dry. He is not diaphoretic.  Psychiatric: He has a normal mood and affect. His behavior is normal.         Assessment & Plan:   Physical exam: Screening blood work Immunizations Colonoscopy  - no longer needed at his age Eye exams  Up to date   EKG - done by cardiology Exercise - none right now, encouraged regular walking - gait instablity - discussed PT - deferred today Weight - BMI low - appears malnutritioned Skin    - sees derm twice a year Substance abuse   None   See Problem List for Assessment and Plan of chronic medical problems.

## 2016-05-01 NOTE — Assessment & Plan Note (Signed)
Check a1c Low sugar / carb diet Stressed regular exercise   

## 2016-05-01 NOTE — Assessment & Plan Note (Signed)
No chest pain, sob Following with cardiology Continue current medications

## 2016-05-01 NOTE — Assessment & Plan Note (Signed)
Check lipid panel  Continue daily statin Regular exercise and healthy diet encouraged  

## 2016-05-01 NOTE — Assessment & Plan Note (Signed)
Sees derm 2/year 

## 2016-05-03 ENCOUNTER — Ambulatory Visit (INDEPENDENT_AMBULATORY_CARE_PROVIDER_SITE_OTHER)
Admission: RE | Admit: 2016-05-03 | Discharge: 2016-05-03 | Disposition: A | Discharge status: Home / Self Care | Payer: Medicare Other | Source: Ambulatory Visit | Attending: Internal Medicine | Admitting: Internal Medicine

## 2016-05-03 ENCOUNTER — Other Ambulatory Visit (INDEPENDENT_AMBULATORY_CARE_PROVIDER_SITE_OTHER): Payer: Medicare Other

## 2016-05-03 ENCOUNTER — Encounter: Payer: Self-pay | Admitting: Neurology

## 2016-05-03 DIAGNOSIS — I4891 Unspecified atrial fibrillation: Secondary | ICD-10-CM | POA: Diagnosis not present

## 2016-05-03 DIAGNOSIS — M810 Age-related osteoporosis without current pathological fracture: Secondary | ICD-10-CM

## 2016-05-03 DIAGNOSIS — Z0000 Encounter for general adult medical examination without abnormal findings: Secondary | ICD-10-CM

## 2016-05-03 DIAGNOSIS — R7303 Prediabetes: Secondary | ICD-10-CM

## 2016-05-03 DIAGNOSIS — E7800 Pure hypercholesterolemia, unspecified: Secondary | ICD-10-CM

## 2016-05-03 DIAGNOSIS — M79604 Pain in right leg: Secondary | ICD-10-CM | POA: Diagnosis not present

## 2016-05-03 DIAGNOSIS — D649 Anemia, unspecified: Secondary | ICD-10-CM | POA: Diagnosis not present

## 2016-05-03 DIAGNOSIS — N289 Disorder of kidney and ureter, unspecified: Secondary | ICD-10-CM | POA: Diagnosis not present

## 2016-05-03 DIAGNOSIS — I10 Essential (primary) hypertension: Secondary | ICD-10-CM | POA: Diagnosis not present

## 2016-05-03 DIAGNOSIS — M79605 Pain in left leg: Secondary | ICD-10-CM

## 2016-05-03 DIAGNOSIS — I2510 Atherosclerotic heart disease of native coronary artery without angina pectoris: Secondary | ICD-10-CM

## 2016-05-03 LAB — VITAMIN D 25 HYDROXY (VIT D DEFICIENCY, FRACTURES): VITD: 48.66 ng/mL (ref 30.00–100.00)

## 2016-05-03 LAB — CBC WITH DIFFERENTIAL/PLATELET
Basophils Absolute: 0.1 10*3/uL (ref 0.0–0.1)
Basophils Relative: 0.7 % (ref 0.0–3.0)
Eosinophils Absolute: 0.1 10*3/uL (ref 0.0–0.7)
Eosinophils Relative: 1.5 % (ref 0.0–5.0)
HCT: 34.8 % — ABNORMAL LOW (ref 39.0–52.0)
Hemoglobin: 11.6 g/dL — ABNORMAL LOW (ref 13.0–17.0)
Lymphocytes Relative: 25.1 % (ref 12.0–46.0)
Lymphs Abs: 2.2 10*3/uL (ref 0.7–4.0)
MCHC: 33.2 g/dL (ref 30.0–36.0)
MCV: 87.8 fl (ref 78.0–100.0)
Monocytes Absolute: 1.1 10*3/uL — ABNORMAL HIGH (ref 0.1–1.0)
Monocytes Relative: 12.4 % — ABNORMAL HIGH (ref 3.0–12.0)
Neutro Abs: 5.4 10*3/uL (ref 1.4–7.7)
Neutrophils Relative %: 60.3 % (ref 43.0–77.0)
Platelets: 187 10*3/uL (ref 150.0–400.0)
RBC: 3.96 Mil/uL — ABNORMAL LOW (ref 4.22–5.81)
RDW: 13.8 % (ref 11.5–15.5)
WBC: 8.9 10*3/uL (ref 4.0–10.5)

## 2016-05-03 LAB — COMPREHENSIVE METABOLIC PANEL
ALT: 12 U/L (ref 0–53)
AST: 17 U/L (ref 0–37)
Albumin: 4.2 g/dL (ref 3.5–5.2)
Alkaline Phosphatase: 84 U/L (ref 39–117)
BUN: 20 mg/dL (ref 6–23)
CO2: 20 mEq/L (ref 19–32)
Calcium: 9.8 mg/dL (ref 8.4–10.5)
Chloride: 112 mEq/L (ref 96–112)
Creatinine, Ser: 1.73 mg/dL — ABNORMAL HIGH (ref 0.40–1.50)
GFR: 40.2 mL/min — ABNORMAL LOW (ref >60.00)
Glucose, Bld: 105 mg/dL — ABNORMAL HIGH (ref 70–99)
Potassium: 4.4 mEq/L (ref 3.5–5.1)
Sodium: 143 mEq/L (ref 135–145)
Total Bilirubin: 0.5 mg/dL (ref 0.2–1.2)
Total Protein: 8.2 g/dL (ref 6.0–8.3)

## 2016-05-03 LAB — HEMOGLOBIN A1C: Hgb A1c MFr Bld: 6.3 % (ref 4.6–6.5)

## 2016-05-03 LAB — LIPID PANEL
Cholesterol: 105 mg/dL (ref 0–200)
HDL: 31.2 mg/dL — ABNORMAL LOW (ref >39.00)
LDL Cholesterol: 59 mg/dL (ref 0–99)
NonHDL: 73.96
Total CHOL/HDL Ratio: 3
Triglycerides: 73 mg/dL (ref 0.0–149.0)
VLDL: 14.6 mg/dL (ref 0.0–40.0)

## 2016-05-03 LAB — VITAMIN B12: Vitamin B-12: 559 pg/mL (ref 211–911)

## 2016-05-03 LAB — TSH: TSH: 3.38 u[IU]/mL (ref 0.35–4.50)

## 2016-05-16 ENCOUNTER — Ambulatory Visit (INDEPENDENT_AMBULATORY_CARE_PROVIDER_SITE_OTHER): Payer: Medicare Other | Admitting: *Deleted

## 2016-05-16 DIAGNOSIS — Z952 Presence of prosthetic heart valve: Secondary | ICD-10-CM | POA: Diagnosis not present

## 2016-05-16 DIAGNOSIS — I4891 Unspecified atrial fibrillation: Secondary | ICD-10-CM | POA: Diagnosis not present

## 2016-05-16 DIAGNOSIS — Z5181 Encounter for therapeutic drug level monitoring: Secondary | ICD-10-CM

## 2016-05-16 LAB — POCT INR: INR: 1.9

## 2016-05-28 ENCOUNTER — Other Ambulatory Visit: Payer: Self-pay | Admitting: Internal Medicine

## 2016-05-28 DIAGNOSIS — M79604 Pain in right leg: Secondary | ICD-10-CM

## 2016-05-28 DIAGNOSIS — M79605 Pain in left leg: Principal | ICD-10-CM

## 2016-05-30 ENCOUNTER — Encounter: Payer: Self-pay | Admitting: Internal Medicine

## 2016-05-30 ENCOUNTER — Ambulatory Visit (INDEPENDENT_AMBULATORY_CARE_PROVIDER_SITE_OTHER): Payer: Medicare Other | Admitting: *Deleted

## 2016-05-30 ENCOUNTER — Ambulatory Visit (INDEPENDENT_AMBULATORY_CARE_PROVIDER_SITE_OTHER): Payer: Medicare Other | Admitting: Internal Medicine

## 2016-05-30 VITALS — BP 130/64 | HR 77 | Temp 97.7°F | Ht 73.0 in | Wt 146.0 lb

## 2016-05-30 DIAGNOSIS — Z952 Presence of prosthetic heart valve: Secondary | ICD-10-CM

## 2016-05-30 DIAGNOSIS — I4891 Unspecified atrial fibrillation: Secondary | ICD-10-CM | POA: Diagnosis not present

## 2016-05-30 DIAGNOSIS — Z5181 Encounter for therapeutic drug level monitoring: Secondary | ICD-10-CM | POA: Diagnosis not present

## 2016-05-30 DIAGNOSIS — M810 Age-related osteoporosis without current pathological fracture: Secondary | ICD-10-CM

## 2016-05-30 LAB — POCT INR: INR: 2.2

## 2016-05-30 NOTE — Patient Instructions (Addendum)
Goal 1200 mg of calcium / day - take 600mg  at one time.  Goal of 1000-2000 units of vitamin D daily.   Continue multivitamin Start calcium - vitamin D pill 500-600 mg of calcium and 1000-1500 mg of vitamin D daily   We will call you regarding starting prolia.     information regarding prolia:  Official reprint from UpToDate  www.uptodate.com 2017 UpToDate   The content on the UpToDate website is not intended nor recommended as a substitute for medical advice, diagnosis, or treatment. Always seek the advice of your own physician or other qualified health care professional regarding any medical questions or conditions. The use of UpToDate content is governed by the UpToDate Terms of Use. 2017 UpToDate, [place] rights reserved.  Denosumab: Patient drug information  Copyright [phone] [employer], [place] rights reserved.  (For additional information see "Denosumab: Drug information" and see "Denosumab: Pediatric drug information") Brand Names: [place]  Prolia;  Xgeva What is this drug used for?   It is used to treat soft, brittle bones (osteoporosis).   It is used for bone growth.   It is used when treating some cancers.   It is used to treat high calcium levels in patients with cancer.   It may be given to you for other reasons. Talk with the doctor. What do I need to tell my doctor BEFORE I take this drug?   If you have an allergy to denosumab or any other part of this drug.   If you are allergic to any drugs like this one, any other drugs, foods, or other substances. Tell your doctor about the allergy and what signs you had, like rash; hives; itching; shortness of breath; wheezing; cough; swelling of face, lips, tongue, or throat; or any other signs.   If you have low calcium levels.   If you are breast-feeding or plan to breast-feed.   If you are pregnant or may be pregnant. Some brands of this drug are not for use during pregnancy.  This is not a list of all drugs or  health problems that interact with this drug.  Tell your doctor and pharmacist about all of your drugs (prescription or OTC, natural products, vitamins) and health problems. You must check to make sure that it is safe for you to take this drug with all of your drugs and health problems. Do not start, stop, or change the dose of any drug without checking with your doctor. What are some things I need to know or do while I take this drug?  All products:   Tell all of your health care providers that you take this drug. This includes your doctors, nurses, pharmacists, and dentists.   This drug may raise the chance of a broken leg. Talk with the doctor.   Have blood work checked as you have been told by the doctor. Talk with the doctor.   Have a bone density test as you have been told by your doctor. Talk with your doctor.   Take calcium and vitamin D as you were told by your doctor.   Have a dental exam before starting this drug.   Take good care of your teeth. See a dentist often.   If you smoke, talk with your doctor.   If you are a man and your sex partner is pregnant or gets pregnant at any time while you are being treated, talk with your doctor.   This drug may cause harm to the unborn baby if  you take it while you are pregnant.  Xgeva:   Use birth control you can trust to prevent pregnancy while you are taking this drug and for 5 months after you stop taking it.   If you get pregnant while taking this drug or within 5 months after your last dose, call your doctor right away.  Prolia:   Very bad infections have been reported with use of this drug. If you have any infection, are taking antibiotics now or in the recent past, or have many infections, talk with your doctor.   You may have more chance of getting an infection. Wash hands often. Stay away from people with infections, colds, or flu.   This drug may lower blood calcium levels. If you already have low blood calcium, it may  get worse with this drug. Sometimes, blood calcium levels have stayed low for weeks or months after use of this drug. Talk with the doctor.   After this drug is stopped, the chance of a broken bone is raised. This includes bones in the spine. The chance of having more than 1 broken bone in the spine is raised if you have ever had a broken bone in your spine. Do not stop treatment with this drug without talking to your doctor.   Use birth control that you can trust to prevent pregnancy while taking this drug.   If you are pregnant or you get pregnant while taking this drug, call your doctor right away. What are some side effects that I need to call my doctor about right away?  WARNING/CAUTION: Even though it may be rare, some people may have very bad and sometimes deadly side effects when taking a drug. Tell your doctor or get medical help right away if you have any of the following signs or symptoms that may be related to a very bad side effect:  All products:   Signs of an allergic reaction, like rash; hives; itching; red, swollen, blistered, or peeling skin with or without fever; wheezing; tightness in the chest or throat; trouble breathing or talking; unusual hoarseness; or swelling of the mouth, face, lips, tongue, or throat.   Signs of low calcium levels like muscle cramps or spasms, numbness and tingling, or seizures.   Mouth sores.   Swelling in the arms or legs.   Feeling very tired or weak.   Any new or strange groin, hip, or thigh pain.   Very bad bone, joint, or muscle pain.   This drug may cause jawbone problems. The chance may be higher the longer you take this drug. The chance may be higher if you have cancer, dental problems, dentures that do not fit well, anemia, blood clotting problems, or an infection. The chance may also be higher if you are having dental work, getting chemo or radiation, or taking other drugs that may cause jawbone problems like some steroid drugs. There  are many drugs that can do this. Ask your doctor or pharmacist if you are not sure. Call your doctor right away if you have jaw swelling or pain.  Xgeva:   Muscle pain or weakness.   Seizures.   Shortness of breath.   High calcium levels have happened after this drug was stopped in people whose bones were still growing. Call your doctor right away if you have signs of high calcium levels like weakness, confusion, feeling tired, headache, upset stomach or throwing up, constipation, or bone pain.  Prolia:   Signs of infection like fever,  chills, very bad sore throat, ear or sinus pain, cough, more sputum or change in color of sputum, pain with passing urine, mouth sores, or wound that will not heal.   Signs of a pancreas problem (pancreatitis) like very bad stomach pain, very bad back pain, or very bad upset stomach or throwing up.   Chest pain.   A heartbeat that does not feel normal.   Very bad skin irritation.   Bladder pain or pain when passing urine or change in how much urine is passed.   Passing urine more often. What are some other side effects of this drug?  All drugs may cause side effects. However, many people have no side effects or only have minor side effects. Call your doctor or get medical help if any of these side effects or any other side effects bother you or do not go away:  All products:   Back pain.  Xgeva:   Hard stools (constipation).   Not hungry.   Joint pain.   Feeling tired or weak.   Headache.   Upset stomach or throwing up.   Loose stools (diarrhea).  Prolia:   Muscle or joint pain.   Sore throat.   Runny nose.   Pain in arms or legs.  These are not all of the side effects that may occur. If you have questions about side effects, call your doctor. Call your doctor for medical advice about side effects.  You may report side effects to your national health agency. How is this drug best taken?  Use this drug as ordered by your  doctor. Read all information given to you. Follow all instructions closely.   It is given as a shot into the fatty part of the skin. What do I do if I miss a dose?   Call your doctor to find out what to do. How do I store and/or throw out this drug?   If you need to store this drug at home, talk with your doctor, nurse, or pharmacist about how to store it. General drug facts   If your symptoms or health problems do not get better or if they become worse, call your doctor.   Do not share your drugs with others and do not take anyone else's drugs.   Keep a list of all your drugs (prescription, natural products, vitamins, OTC) with you. Give this list to your doctor.   Talk with the doctor before starting any new drug, including prescription or OTC, natural products, or vitamins.   Keep all drugs in a safe place. Keep all drugs out of the reach of children and pets.   Check with your pharmacist about how to throw out unused drugs.   Some drugs may have another patient information leaflet. If you have any questions about this drug, please talk with your doctor, nurse, pharmacist, or other health care provider.   If you think there has been an overdose, call your poison control center or get medical care right away. Be ready to tell or show what was taken, how much, and when it happened. Use of UpToDate is subject to the Subscription and License Agreement.  Topic 15576 Version 104.0   Zoledronic Acid injection (Reclast - Osteoporosis) What is this medicine? ZOLEDRONIC ACID (ZOE le dron ik AS id) lowers the amount of calcium loss from bone. It is used to treat Paget's disease and osteoporosis in women. This medicine may be used for other purposes; ask your health care provider or  pharmacist if you have questions. COMMON BRAND NAME(S): Reclast, Zometa What should I tell my health care provider before I take this medicine? They need to know if you have any of these  conditions: -aspirin-sensitive asthma -cancer, especially if you are receiving medicines used to treat cancer -dental disease or wear dentures -infection -kidney disease -low levels of calcium in the blood -past surgery on the parathyroid gland or intestines -receiving corticosteroids like dexamethasone or prednisone -an unusual or allergic reaction to zoledronic acid, other medicines, foods, dyes, or preservatives -pregnant or trying to get pregnant -breast-feeding How should I use this medicine? This medicine is for infusion into a vein. It is given by a health care professional in a hospital or clinic setting. Talk to your pediatrician regarding the use of this medicine in children. This medicine is not approved for use in children. Overdosage: If you think you have taken too much of this medicine contact a poison control center or emergency room at once. NOTE: This medicine is only for you. Do not share this medicine with others. What if I miss a dose? It is important not to miss your dose. Call your doctor or health care professional if you are unable to keep an appointment. What may interact with this medicine? -certain antibiotics given by injection -NSAIDs, medicines for pain and inflammation, like ibuprofen or naproxen -some diuretics like bumetanide, furosemide -teriparatide This list may not describe all possible interactions. Give your health care provider a list of all the medicines, herbs, non-prescription drugs, or dietary supplements you use. Also tell them if you smoke, drink alcohol, or use illegal drugs. Some items may interact with your medicine. What should I watch for while using this medicine? Visit your doctor or health care professional for regular checkups. It may be some time before you see the benefit from this medicine. Do not stop taking your medicine unless your doctor tells you to. Your doctor may order blood tests or other tests to see how you are  doing. Women should inform their doctor if they wish to become pregnant or think they might be pregnant. There is a potential for serious side effects to an unborn child. Talk to your health care professional or pharmacist for more information. You should make sure that you get enough calcium and vitamin D while you are taking this medicine. Discuss the foods you eat and the vitamins you take with your health care professional. Some people who take this medicine have severe bone, joint, and/or muscle pain. This medicine may also increase your risk for jaw problems or a broken thigh bone. Tell your doctor right away if you have severe pain in your jaw, bones, joints, or muscles. Tell your doctor if you have any pain that does not go away or that gets worse. Tell your dentist and dental surgeon that you are taking this medicine. You should not have major dental surgery while on this medicine. See your dentist to have a dental exam and fix any dental problems before starting this medicine. Take good care of your teeth while on this medicine. Make sure you see your dentist for regular follow-up appointments. What side effects may I notice from receiving this medicine? Side effects that you should report to your doctor or health care professional as soon as possible: -allergic reactions like skin rash, itching or hives, swelling of the face, lips, or tongue -anxiety, confusion, or depression -breathing problems -changes in vision -eye pain -feeling faint or lightheaded, falls -jaw pain,  especially after dental work -mouth sores -muscle cramps, stiffness, or weakness -redness, blistering, peeling or loosening of the skin, including inside the mouth -trouble passing urine or change in the amount of urine Side effects that usually do not require medical attention (report to your doctor or health care professional if they continue or are bothersome): -bone, joint, or muscle  pain -constipation -diarrhea -fever -hair loss -irritation at site where injected -loss of appetite -nausea, vomiting -stomach upset -trouble sleeping -trouble swallowing -weak or tired This list may not describe all possible side effects. Call your doctor for medical advice about side effects. You may report side effects to FDA at 1-800-FDA-1088. Where should I keep my medicine? This drug is given in a hospital or clinic and will not be stored at home. NOTE: This sheet is a summary. It may not cover all possible information. If you have questions about this medicine, talk to your doctor, pharmacist, or health care provider.  2018 Elsevier/Gold Standard (2013-07-04 14:19:57)

## 2016-05-30 NOTE — Progress Notes (Signed)
Subjective:    Patient ID: Robert Richard, male    DOB: November 05, 1930, 83 y.o.   MRN: [id number]  HPI He is here for follow up of osteoporosis:    He was on fosamax for more than 5 years and did reclast in 2013 - they are unsure how many infusions he had.    Date of study: 05/03/2016 Indication: f/u for osteoporosis Comparison: 06/26/2011 Clinical data: Pt is a 83 y.o. male with history of clavicle, foot, finger fractures. On Calcium and vitamin D.   Results:  Lumbar spine L1-L4(L3) Femoral neck (FN)  T-score - 0.8 RFN: - 2.1 LFN: - 2.5  Change in BMD from previous DXA test (%) - 5.4%* + 0.7%   (*) statistically significant  Assessment: Patient has OSTEOPOROSIS according to the [facility] classification for osteoporosis (see below). Fracture risk: high   He does have GERD, but it is currently controlled.   He is doing minimal exercise.  He is taking a multivitamin.    After being on fosamax his bone density continued to decrease - that is why he was switched to reclast.   Medications and allergies reviewed with patient and updated if appropriate.  Patient Active Problem List   Diagnosis Date Noted  . Prediabetes 05/01/2016  . Pain in both lower extremities 05/01/2016  . Bilateral hearing loss 05/01/2016  . Migraine 04/26/2015  . Aortic valve replaced 04/21/2015  . Subacute confusional state 12/14/2014  . Dyspnea 09/16/2013  . Pulmonary infiltrates 07/31/2013  . Encounter for therapeutic drug monitoring 03/20/2013  . CAD (coronary artery disease) 03/13/2013  . Atrial fibrillation, controlled ([place]) 03/13/2013  . Protein-calorie malnutrition, severe ([place]) 02/27/2013  . S/P CABG x 3 02/20/2013  . History of embolectomy 09/22/2012  . Renal insufficiency, mild 09/22/2012  . Syncope 09/15/2012  . Peripheral vascular disease, unspecified 07/08/2012  . Mitral stenosis 07/27/2010  . Hyperlipidemia 10/25/2009  . Essential hypertension 10/25/2009  . NEPHROLITHIASIS, HX OF  10/25/2009  . SLEEP DISORDER 11/29/2008  . SNORING 10/14/2008  . APNEA 10/05/2008  . SKIN CANCER, HX OF 10/05/2008  . RESTLESS LEG SYNDROME 09/11/2007  . Allergic rhinitis, cause unspecified 09/11/2007  . GOUT 03/15/2006  . ASTHMA 03/15/2006  . COPD 03/15/2006  . Osteoporosis 03/15/2006  . COLONIC POLYPS, HX OF 03/15/2006    Current Outpatient Prescriptions on File Prior to Visit  Medication Sig Dispense Refill  . busPIRone (BUSPAR) 10 MG tablet Take 2 tablets (20 mg total) by mouth 2 (two) times daily. 120 tablet 5  . diltiazem (CARDIZEM CD) 120 MG 24 hr capsule TAKE 1 CAPSULE (120 MG TOTAL) BY MOUTH DAILY. TAKE IN ADDITION TO 240MG  DAILY 30 capsule 7  . diltiazem (CARDIZEM CD) 240 MG 24 hr capsule Take 1 capsule (240 mg total) by mouth daily. 30 capsule 6  . ketotifen (ZADITOR) 0.025 % ophthalmic solution Place 1 drop into both eyes daily.     . Lidocaine HCl 4 % SOLN Apply 4 mLs topically as needed (for migraines).     . Multiple Vitamins-Minerals (DAILY MULTIVITAMIN) CAPS Take 1 capsule by mouth daily.     . nitroGLYCERIN (NITROSTAT) 0.4 MG SL tablet Place 0.4 mg under the tongue every 5 (five) minutes as needed for chest pain.    . polyethylene glycol (MIRALAX / GLYCOLAX) packet Take 17 g by mouth daily. 14 each 0  . pravastatin (PRAVACHOL) 40 MG tablet TAKE ONE (1) TABLET EACH DAY 90 tablet 3  . ranitidine (ZANTAC) 150 MG tablet Take  1 tablet (150 mg total) by mouth 2 (two) times daily. 180 tablet 3  . topiramate (TOPAMAX) 25 MG tablet Take 75 mg by mouth daily.    Marland Kitchen warfarin (COUMADIN) 2 MG tablet Take as directed by [facility] 40 tablet 3   No current facility-administered medications on file prior to visit.     Past Medical History:  Diagnosis Date  . Allergic rhinitis   . Anemia   . Arthritis    SHOULDER  . Asthma with bronchitis   . Colon polyp   . COPD (chronic obstructive pulmonary disease) (HCC)    Astmatic bronchitis  . Dysrhythmia 08/2012   NEW ONSET  ATRIAL FIBRILATION  . Gout    PMH  . Headache(784.0)   . HLD (hyperlipidemia)   . Nephrolithiasis   . Osteoporosis   . RLS (restless legs syndrome)   . Skin cancer (melanoma) ([place]) 2012   Right neck  . Skin cancer, basal cell    Dr Nevada Crane, [facility]    Past Surgical History:  Procedure Laterality Date  . AORTIC VALVE REPLACEMENT N/A 02/20/2013   Procedure: AORTIC VALVE REPLACEMENT (AVR);  Surgeon: Ivin Poot, MD;  Location: [place];  Service: Open Heart Surgery;  Laterality: N/A;  . COLONOSCOPY W/ POLYPECTOMY     Dr Deatra Ina  . CORONARY ARTERY BYPASS GRAFT N/A 02/20/2013   Procedure: CORONARY ARTERY BYPASS GRAFTING (CABG);  Surgeon: Ivin Poot, MD;  Location: [place];  Service: Open Heart Surgery;  Laterality: N/A;  . FINGER SURGERY     for foreign body  . INGUINAL HERNIA REPAIR    . INTRAOPERATIVE TRANSESOPHAGEAL ECHOCARDIOGRAM N/A 02/20/2013   Procedure: INTRAOPERATIVE TRANSESOPHAGEAL ECHOCARDIOGRAM;  Surgeon: Ivin Poot, MD;  Location: [place];  Service: Open Heart Surgery;  Laterality: N/A;  . LEFT AND RIGHT HEART CATHETERIZATION WITH CORONARY ANGIOGRAM N/A 01/05/2013   Procedure: LEFT AND RIGHT HEART CATHETERIZATION WITH CORONARY ANGIOGRAM;  Surgeon: Blane Ohara, MD;  Location: [facility] CATH LAB;  Service: Cardiovascular;  Laterality: N/A;  . LEG SURGERY  06/2010    Dr.Lawson, for blood clott. Left leg   . NECK SURGERY  12/2010   Melanoma ; [facility]   . NOSE SURGERY     Septal Deviation  . RIGHT HEART CATHETERIZATION N/A 01/23/2013   Procedure: RIGHT HEART CATH;  Surgeon: Jolaine Artist, MD;  Location[facility] LAB;  Service: Cardiovascular;  Laterality: N/A;  . TEE WITHOUT CARDIOVERSION N/A 01/23/2013   Procedure: TRANSESOPHAGEAL ECHOCARDIOGRAM (TEE);  Surgeon: Jolaine Artist, MD;  Location: [facility] ENDOSCOPY;  Service: Cardiovascular;  Laterality: N/A;  sign heart cath consent w/tee consent    Social History   Social History  . Marital status: Married    Spouse  name: N/A  . Number of children: N/A  . Years of education: N/A   Occupational History  . retired Astron[employer]   Social History Main Topics  . Smoking status: Former Smoker    Packs/day: 3.00    Years: 15.00    Types: Cigarettes    Quit date: 02/19/1961  . Smokeless tobacco: Never Used  . Alcohol use No  . Drug use: No  . Sexual activity: Not Asked   Other Topics Concern  . None   Social History Narrative  . None    Family History  Problem Relation Age of Onset  . Allergies Mother   . Coronary artery disease Mother   . Allergy (severe) Mother   . Allergies Father   .  Stroke Father   . Heart attack Father 69  . Asthma Father   . Allergy (severe) Father   . Allergies Sister   . Allergy (severe) Sister   . Allergies Brother     x2  . Allergy (severe) Brother   . Diabetes Brother   . Allergy (severe) Brother   . Emphysema Brother     smoked  . Cancer Son     Review of Systems     Objective:   Vitals:   05/30/16 1537  BP: 130/64  Pulse: 77  Temp: 97.7 F (36.5 C)   Filed Weights   05/30/16 1537  Weight: 146 lb (66.2 kg)   Body mass index is 19.26 kg/m.  Wt Readings from Last 3 Encounters:  05/30/16 146 lb (66.2 kg)  05/01/16 147 lb (66.7 kg)  11/18/15 147 lb (66.7 kg)     Physical Exam        Assessment & Plan:   25 minutes were spent face-to-face with the patient, over 50% of which was spent counseling regarding cause of osteoporosis and treatment    See Problem List for Assessment and Plan of chronic medical problems.

## 2016-05-30 NOTE — Progress Notes (Signed)
Pre visit review using our clinic review tool, if applicable. No additional management support is needed unless otherwise documented below in the visit note. 

## 2016-05-30 NOTE — Assessment & Plan Note (Signed)
Was on fosamax > 5 years - still had dec in bone density, started on reclast Has gerd Best options for him are prolia or reclast Increase calcium intake - start calcium / vitamin d supplement once daily Will check D level with next blood draw Increase exercise Will look into prolia

## 2016-05-31 ENCOUNTER — Telehealth: Payer: Self-pay

## 2016-05-31 NOTE — Telephone Encounter (Signed)
-----   Message from [name], MD sent at 05/30/2016  9:25 PM EDT ----- He has osteoporosis.  Was on fosamax in past - still had dec bone density on medication.  Did reclast.  Has gerd.   Would like to see if he is eligible for prolia.  Advised his wife/him you would be in touch and it may take a couple of weeks.

## 2016-05-31 NOTE — Telephone Encounter (Signed)
Robert Richard will be submitting information into insurance for verification and summary of benefits, I will call patient after I receive that information back---can talk with [name] if any questions

## 2016-06-01 ENCOUNTER — Ambulatory Visit (HOSPITAL_COMMUNITY)
Admission: RE | Admit: 2016-06-01 | Discharge: 2016-06-01 | Disposition: A | Discharge status: Home / Self Care | Payer: Medicare Other | Source: Ambulatory Visit | Attending: Cardiology | Admitting: Cardiology

## 2016-06-01 DIAGNOSIS — I2510 Atherosclerotic heart disease of native coronary artery without angina pectoris: Secondary | ICD-10-CM | POA: Diagnosis not present

## 2016-06-01 DIAGNOSIS — M79605 Pain in left leg: Secondary | ICD-10-CM | POA: Insufficient documentation

## 2016-06-01 DIAGNOSIS — R938 Abnormal findings on diagnostic imaging of other specified body structures: Secondary | ICD-10-CM | POA: Diagnosis not present

## 2016-06-01 DIAGNOSIS — M79604 Pain in right leg: Secondary | ICD-10-CM | POA: Diagnosis not present

## 2016-06-01 DIAGNOSIS — Z951 Presence of aortocoronary bypass graft: Secondary | ICD-10-CM | POA: Diagnosis not present

## 2016-06-01 DIAGNOSIS — I10 Essential (primary) hypertension: Secondary | ICD-10-CM | POA: Diagnosis not present

## 2016-06-01 DIAGNOSIS — Z87891 Personal history of nicotine dependence: Secondary | ICD-10-CM | POA: Diagnosis not present

## 2016-06-01 DIAGNOSIS — I739 Peripheral vascular disease, unspecified: Secondary | ICD-10-CM | POA: Diagnosis not present

## 2016-06-20 ENCOUNTER — Ambulatory Visit (INDEPENDENT_AMBULATORY_CARE_PROVIDER_SITE_OTHER): Payer: Medicare Other | Admitting: *Deleted

## 2016-06-20 DIAGNOSIS — Z952 Presence of prosthetic heart valve: Secondary | ICD-10-CM | POA: Diagnosis not present

## 2016-06-20 DIAGNOSIS — Z5181 Encounter for therapeutic drug level monitoring: Secondary | ICD-10-CM | POA: Diagnosis not present

## 2016-06-20 DIAGNOSIS — I4891 Unspecified atrial fibrillation: Secondary | ICD-10-CM

## 2016-06-20 LAB — POCT INR: INR: 2.7

## 2016-07-04 DIAGNOSIS — R51 Headache: Secondary | ICD-10-CM | POA: Diagnosis not present

## 2016-07-04 DIAGNOSIS — G43719 Chronic migraine without aura, intractable, without status migrainosus: Secondary | ICD-10-CM | POA: Diagnosis not present

## 2016-07-06 ENCOUNTER — Ambulatory Visit (INDEPENDENT_AMBULATORY_CARE_PROVIDER_SITE_OTHER): Payer: Medicare Other | Admitting: Neurology

## 2016-07-06 ENCOUNTER — Encounter: Payer: Self-pay | Admitting: Neurology

## 2016-07-06 VITALS — BP 122/60 | HR 65 | Ht 73.0 in | Wt 150.0 lb

## 2016-07-06 DIAGNOSIS — M79605 Pain in left leg: Secondary | ICD-10-CM

## 2016-07-06 DIAGNOSIS — M79604 Pain in right leg: Secondary | ICD-10-CM

## 2016-07-06 MED ORDER — GABAPENTIN 100 MG PO CAPS: ORAL_CAPSULE | ORAL | 5 refills | Status: DC

## 2016-07-06 NOTE — Patient Instructions (Signed)
Start gabapentin 100mg  at bedtime for one week, then increase to 100mg  twice daily  Return to clinic 4 months

## 2016-07-06 NOTE — Progress Notes (Signed)
[facility] Clinic Note - Initial Visit   Date: 07/06/16  Robert Richard MRN: [id number] DOB: 06-10-1930   Dear Dr. Quay Burow:  Thank you for your kind referral of Robert Richard for consultation of bilateral leg pain. Although his history is well known to you, please allow [place] to reiterate it for the purpose of our medical record. The patient was accompanied to the clinic by wife.    History of Present Illness: Robert Richard is a 83 y.o. right-handed Caucasian male with CAD s/p CABG, PAF, PVD, HPL, HTN, RLS, anemia, asthma, COPD, history of cerebellar stroke (2015), and CKD presenting for evaluation of bilateral leg pain.    For the past 5-10 years, he had had dull achy pain of the lower legs from the knee down.  He denies numbness, burning, stabbing, or tingling. Pain is present at rest and with activity and there is nothing that makes it better.  Sometimes, walking can help but not always. It is worse during the daytime.  He does not have discomfort such as crawling sensation of the legs. There is no muscle tenderness.  He has arterial studies of the legs which were normal.  His has some imbalance and has been walking with a cane for the past 3 years.  He has not suffered any recent falls.  He also has generalized weakness of the arm and legs.  He was much more active prior to his CABG, but since this time, he has become sedentary.  Out-side paper records, electronic medical record, and images have been reviewed where available and summarized as:  Vascular studies of the legs 06/01/2016: No evidence of segmental lower extremity arterial disease at rest, bilaterally. ABI's are inaccurate; non compressible arteries due to medial calcifications. Abnormal right great toe pressure. Normal left great toe pressure.  Past Medical History:  Diagnosis Date  . Allergic rhinitis   . Anemia   . Arthritis    SHOULDER  . Asthma with bronchitis   . Colon polyp   . COPD  (chronic obstructive pulmonary disease) (HCC)    Astmatic bronchitis  . Dysrhythmia 08/2012   NEW ONSET ATRIAL FIBRILATION  . Gout    PMH  . Headache(784.0)   . HLD (hyperlipidemia)   . Nephrolithiasis   . Osteoporosis   . RLS (restless legs syndrome)   . Skin cancer (melanoma) ([place]) 2012   Right neck  . Skin cancer, basal cell    Dr Nevada Crane, [facility]    Past Surgical History:  Procedure Laterality Date  . AORTIC VALVE REPLACEMENT N/A 02/20/2013   Procedure: AORTIC VALVE REPLACEMENT (AVR);  Surgeon: Ivin Poot, MD;  Location: [place];  Service: Open Heart Surgery;  Laterality: N/A;  . COLONOSCOPY W/ POLYPECTOMY     Dr Deatra Ina  . CORONARY ARTERY BYPASS GRAFT N/A 02/20/2013   Procedure: CORONARY ARTERY BYPASS GRAFTING (CABG);  Surgeon: Ivin Poot, MD;  Location: [place];  Service: Open Heart Surgery;  Laterality: N/A;  . FINGER SURGERY     for foreign body  . INGUINAL HERNIA REPAIR    . INTRAOPERATIVE TRANSESOPHAGEAL ECHOCARDIOGRAM N/A 02/20/2013   Procedure: INTRAOPERATIVE TRANSESOPHAGEAL ECHOCARDIOGRAM;  Surgeon: Ivin Poot, MD;  Location: [place];  Service: Open Heart Surgery;  Laterality: N/A;  . LEFT AND RIGHT HEART CATHETERIZATION WITH CORONARY ANGIOGRAM N/A 01/05/2013   Procedure: LEFT AND RIGHT HEART CATHETERIZATION WITH CORONARY ANGIOGRAM;  Surgeon: Blane Ohara, MD;  Location: [facility] C[facility] LAB;  Service: Cardiovascular;  Laterality: N/A;  . LEG SURGERY  06/2010    Dr.Lawson, for blood clott. Left leg   . NECK SURGERY  12/2010   Melanoma ; [facility]   . NOSE SURGERY     Septal Deviation  . RIGHT HEART CATHETERIZATION N/A 01/23/2013   Procedure: RIGHT HEART CATH;  Surgeon: Jolaine Artist, MD;  Location: [facility] CATH LAB;  Service: Cardiovascular;  Laterality: N/A;  . TEE WITHOUT CARDIOVERSION N/A 01/23/2013   Procedure: TRANSESOPHAGEAL ECHOCARDIOGRAM (TEE);  Surgeon: Jolaine Artist, MD;  Location: [facility] ENDOSCOPY;  Service: Cardiovascular;  Laterality: N/A;  sign heart  cath consent w/tee consent     Medications:  Outpatient Encounter Prescriptions as of 07/06/2016  Medication Sig Note  . busPIRone (BUSPAR) 10 MG tablet Take 2 tablets (20 mg total) by mouth 2 (two) times daily.   Marland Kitchen diltiazem (CARDIZEM CD) 120 MG 24 hr capsule TAKE 1 CAPSULE (120 MG TOTAL) BY MOUTH DAILY. TAKE IN ADDITION TO 240MG  DAILY   . diltiazem (CARDIZEM CD) 240 MG 24 hr capsule Take 1 capsule (240 mg total) by mouth daily.   Marland Kitchen ketotifen (ZADITOR) 0.025 % ophthalmic solution Place 1 drop into both eyes daily.    . Lidocaine HCl 4 % SOLN Apply 4 mLs topically as needed (for migraines).    . Multiple Vitamins-Minerals (DAILY MULTIVITAMIN) CAPS Take 1 capsule by mouth daily.  04/08/2014: Received from: [facility]  . nitroGLYCERIN (NITROSTAT) 0.4 MG SL tablet Place 0.4 mg under the tongue every 5 (five) minutes as needed for chest pain.   . polyethylene glycol (MIRALAX / GLYCOLAX) packet Take 17 g by mouth daily.   . pravastatin (PRAVACHOL) 40 MG tablet TAKE ONE (1) TABLET EACH DAY   . ranitidine (ZANTAC) 150 MG tablet Take 1 tablet (150 mg total) by mouth 2 (two) times daily.   Marland Kitchen topiramate (TOPAMAX) 25 MG tablet Take 75 mg by mouth daily.   Marland Kitchen warfarin (COUMADIN) 2 MG tablet Take as directed by [facility]   . gabapentin (NEURONTIN) 100 MG capsule Take 1 tablet at bedtime x 1 week, then increase to 1 tablet twice daily    No facility-administered encounter medications on file as of 07/06/2016.      Allergies:  Allergies  Allergen Reactions  . Ivp Dye [Iodinated Diagnostic Agents] Hives    Other reaction(s): RASH    Family History: Family History  Problem Relation Age of Onset  . Allergies Mother   . Coronary artery disease Mother   . Allergy (severe) Mother   . Allergies Father   . Stroke Father   . Heart attack Father 46  . Asthma Father   . Allergy (severe) Father   . Allergies Sister   . Allergy (severe) Sister   . Allergies Brother        x2  . Allergy  (severe) Brother   . Diabetes Brother   . Allergy (severe) Brother   . Emphysema Brother        smoked  . Cancer Son     Social History: Social History  Substance Use Topics  . Smoking status: Former Smoker    Packs/day: 3.00    Years: 15.00    Types: Cigarettes    Quit date: 02/19/1961  . Smokeless tobacco: Never Used  . Alcohol use No   Social History   Social History Narrative  . No narrative on file    Review of Systems:  CONSTITUTIONAL: No fevers, chills, night sweats, or weight loss.  EYES: No visual changes or eye pain ENT: No hearing changes.  No history of nose bleeds.   RESPIRATORY: No cough, wheezing and shortness of breath.   CARDIOVASCULAR: Negative for chest pain, and palpitations.   GI: Negative for abdominal discomfort, blood in stools or black stools.  No recent change in bowel habits.   GU:  No history of incontinence.   MUSCLOSKELETAL: +history of joint pain or swelling.  No myalgias.   SKIN: Negative for lesions, rash, and itching.   HEMATOLOGY/ONCOLOGY: Negative for prolonged bleeding, bruising easily, and swollen nodes.   ENDOCRINE: Negative for cold or heat intolerance, polydipsia or goiter.   PSYCH:  +depression or anxiety symptoms.   NEURO: As Above.   Vital Signs:  BP 122/60   Pulse 65   Ht 6\' 1"  (1.854 m)   Wt 150 lb (68 kg)   SpO2 99%   BMI 19.79 kg/m  Pain Scale: 4 on a scale of 0-10   General Medical Exam:   General:  Well appearing, comfortable.   Eyes/ENT: see cranial nerve examination.   Neck: No masses appreciated.  Full range of motion without tenderness.  No carotid bruits. Respiratory:  Clear to auscultation, good air entry bilaterally.   Cardiac:  Regular rate and rhythm, no murmur.   Extremities:  No deformities, edema, or skin discoloration.  Skin:  No rashes or lesions.  Neurological Exam: MENTAL STATUS including orientation to time, place, person, recent and remote memory, attention span and concentration, language,  and fund of knowledge is normal.  Speech is not dysarthric.  Blunted affect.   CRANIAL NERVES: II:  No visual field defects.  Unremarkable fundi.   III-IV-VI: Pupils equal round and reactive to light.  Normal conjugate, extra-ocular eye movements in all directions of gaze.  No nystagmus.   V:  Normal facial sensation.   VII:  Normal facial symmetry and movements.  No pathologic facial reflexes.  VIII:  Normal hearing and vestibular function.   IX-X:  Normal palatal movement.   XI:  Normal shoulder shrug and head rotation.   XII:  Normal tongue strength and range of motion, no deviation or fasciculation.  MOTOR:  Generalized loss of muscle bulk. No fasciculations or abnormal movements.  No pronator drift.  Tone is normal.    Right Upper Extremity:    Left Upper Extremity:    Deltoid  5/5   Deltoid  5/5   Biceps  5/5   Biceps  5/5   Triceps  5/5   Triceps  5/5   Wrist extensors  5/5   Wrist extensors  5/5   Wrist flexors  5/5   Wrist flexors  5/5   Finger extensors  5/5   Finger extensors  5/5   Finger flexors  5/5   Finger flexors  5/5   Dorsal interossei  5/5   Dorsal interossei  5/5   Abductor pollicis  5/5   Abductor pollicis  5/5   Tone (Ashworth scale)  0  Tone (Ashworth scale)  0   Right Lower Extremity:    Left Lower Extremity:    Hip flexors  5/5   Hip flexors  5/5   Hip extensors  5/5   Hip extensors  5/5   Knee flexors  5/5   Knee flexors  5/5   Knee extensors  5/5   Knee extensors  5/5   Dorsiflexors  5/5   Dorsiflexors  5/5   Plantarflexors  5/5   Plantarflexors  5/5  Toe extensors  5/5   Toe extensors  5/5   Toe flexors  5/5   Toe flexors  5/5   Tone (Ashworth scale)  0  Tone (Ashworth scale)  0   MSRs:  Right                                                                 Left brachioradialis 2+  brachioradialis 2+  biceps 2+  biceps 2+  triceps 2+  triceps 2+  patellar 2+  patellar 2+  ankle jerk 1+  ankle jerk 1+  Hoffman no  Hoffman no  plantar response  down  plantar response down   SENSORY:  Vibration is reduced distal to ankles bilaterally (age-appropriate).  Pin prick, temperature, and light touch intact.  COORDINATION/GAIT: Normal finger-to- nose-finger.  Intact rapid alternating movements bilaterally.   Gait wide-based and stable, assisted with cane.   IMPRESSION: Robert Richard is a 83 year-old man referred for evaluation of nonspecific achy leg pain. The nature of his pain does not have characteristic features of neuropathic pain.  He specifically denies numbness, tingling, or shooting pain.  He has achy leg pain which is more suggestive of musculoskeletal pain.  Although restless leg syndrome is a possibility, he denies worsening of symptoms at rest and nighttime and does not have the urge to move the legs.  Nevertheless, even though I have a low clinical suspicion for a primary neurological etiology for this pain, I offered him a trial of gabapentin 100mg  twice daily to see if there is any benefit.  NCS/EMG was discussed but the likelihood of having a false positive result is extremely high in a patient of this age with a low pretest probability.   Return to clinic in 4 months.   The duration of this appointment visit was 40 minutes of face-to-face time with the patient.  Greater than 50% of this time was spent in counseling, explanation of diagnosis, planning of further management, and coordination of care.   Thank you for allowing me to participate in patient's care.  If I can answer any additional questions, I would be pleased to do so.    Sincerely,    Ziasia Lenoir K. Posey Pronto, DO

## 2016-07-18 ENCOUNTER — Ambulatory Visit (INDEPENDENT_AMBULATORY_CARE_PROVIDER_SITE_OTHER): Payer: Medicare Other | Admitting: *Deleted

## 2016-07-18 DIAGNOSIS — I4891 Unspecified atrial fibrillation: Secondary | ICD-10-CM | POA: Diagnosis not present

## 2016-07-18 DIAGNOSIS — Z952 Presence of prosthetic heart valve: Secondary | ICD-10-CM | POA: Diagnosis not present

## 2016-07-18 DIAGNOSIS — Z5181 Encounter for therapeutic drug level monitoring: Secondary | ICD-10-CM

## 2016-07-18 LAB — POCT INR: INR: 2.2

## 2016-07-23 ENCOUNTER — Ambulatory Visit: Payer: Medicare Other | Admitting: Audiology

## 2016-07-27 ENCOUNTER — Ambulatory Visit (INDEPENDENT_AMBULATORY_CARE_PROVIDER_SITE_OTHER): Payer: Medicare Other

## 2016-07-27 DIAGNOSIS — M810 Age-related osteoporosis without current pathological fracture: Secondary | ICD-10-CM

## 2016-07-27 MED ORDER — DENOSUMAB 60 MG/ML ~~LOC~~ SOLN
60.0000 mg | Freq: Once | SUBCUTANEOUS | Status: AC
  Administered 2016-07-27: 60 mg via SUBCUTANEOUS

## 2016-07-27 NOTE — Progress Notes (Signed)
prolia Injection given.   Robert Richard J Sherilee Smotherman, MD  

## 2016-08-04 ENCOUNTER — Other Ambulatory Visit: Payer: Self-pay | Admitting: Internal Medicine

## 2016-08-16 ENCOUNTER — Ambulatory Visit (INDEPENDENT_AMBULATORY_CARE_PROVIDER_SITE_OTHER): Payer: Medicare Other | Admitting: Pharmacist

## 2016-08-16 ENCOUNTER — Ambulatory Visit (INDEPENDENT_AMBULATORY_CARE_PROVIDER_SITE_OTHER): Payer: Medicare Other | Admitting: Internal Medicine

## 2016-08-16 ENCOUNTER — Encounter (INDEPENDENT_AMBULATORY_CARE_PROVIDER_SITE_OTHER): Payer: Self-pay

## 2016-08-16 ENCOUNTER — Encounter: Payer: Self-pay | Admitting: Internal Medicine

## 2016-08-16 VITALS — BP 118/70 | HR 69 | Ht 73.0 in | Wt 143.1 lb

## 2016-08-16 DIAGNOSIS — R2689 Other abnormalities of gait and mobility: Secondary | ICD-10-CM | POA: Diagnosis not present

## 2016-08-16 DIAGNOSIS — Z952 Presence of prosthetic heart valve: Secondary | ICD-10-CM

## 2016-08-16 DIAGNOSIS — I4892 Unspecified atrial flutter: Secondary | ICD-10-CM | POA: Diagnosis not present

## 2016-08-16 DIAGNOSIS — R531 Weakness: Secondary | ICD-10-CM

## 2016-08-16 DIAGNOSIS — Z951 Presence of aortocoronary bypass graft: Secondary | ICD-10-CM

## 2016-08-16 DIAGNOSIS — Z5181 Encounter for therapeutic drug level monitoring: Secondary | ICD-10-CM | POA: Diagnosis not present

## 2016-08-16 DIAGNOSIS — I4891 Unspecified atrial fibrillation: Secondary | ICD-10-CM

## 2016-08-16 LAB — POCT INR: INR: 1.8

## 2016-08-16 NOTE — Patient Instructions (Addendum)
Your physician recommends that you continue on your current medications as directed. Please refer to the Current Medication list given to you today.  You have been referred to Physical Therapy for strengthening, conditioning.  Your physician wants you to follow-up in: February, 2019 with Dr. Harrington Challenger.  You will receive a reminder letter in the mail two months in advance. If you don't receive a letter, please call our office to schedule the follow-up appointment.

## 2016-08-16 NOTE — Progress Notes (Signed)
Cardiology Office Note   Date:  08/18/2016   ID:  Robert Richard, DOB [birth year], MRN [id number]  PCP:  [name], MD  Cardiologist:   Dorris Carnes, MD   F/U of CAD       History of Present Illness: Robert Richard is a 83 y.o. male with a history ofCAD, s/p CABG x 3 (LIMA to LAD; SVG to OM1; SVG to PDA) and AVR (#23 mm [facility] Ease pericardial valve. Post op course long/complicated. Also has history of atrial flutter , PVD, HLD, HTN, restless leg syndrome, anemia, asthma, COPD, prior bilateral chronic infarcts in the cerebellum noted on CT scan from 2015 and CKD.  I saw the pt in September 2017  Since seen the pt denies CP  Breathing is OK His wife says he has really slowed down since CABG  Not the same  Says his activity is way down     Current Meds  Medication Sig  . busPIRone (BUSPAR) 10 MG tablet Take 2 tablets (20 mg total) by mouth 2 (two) times daily.  Marland Kitchen diltiazem (CARDIZEM CD) 120 MG 24 hr capsule TAKE 1 CAPSULE (120 MG TOTAL) BY MOUTH DAILY. TAKE IN ADDITION TO 240MG  DAILY  . diltiazem (CARDIZEM CD) 240 MG 24 hr capsule Take 1 capsule (240 mg total) by mouth daily.  Marland Kitchen gabapentin (NEURONTIN) 100 MG capsule Take 1 tablet at bedtime x 1 week, then increase to 1 tablet twice daily  . ketotifen (ZADITOR) 0.025 % ophthalmic solution Place 1 drop into both eyes daily.   . Lidocaine HCl 4 % SOLN Apply 4 mLs topically as needed (for migraines).   . Multiple Vitamins-Minerals (DAILY MULTIVITAMIN) CAPS Take 1 capsule by mouth daily.   . nitroGLYCERIN (NITROSTAT) 0.4 MG SL tablet Place 0.4 mg under the tongue every 5 (five) minutes as needed for chest pain.  . polyethylene glycol (MIRALAX / GLYCOLAX) packet Take 17 g by mouth daily.  . pravastatin (PRAVACHOL) 40 MG tablet TAKE ONE (1) TABLET EACH DAY  . ranitidine (ZANTAC) 150 MG tablet TAKE ONE TABLET BY MOUTH TWICE A DAY  . topiramate (TOPAMAX) 25 MG tablet Take 75 mg by mouth daily.  Marland Kitchen warfarin (COUMADIN) 2 MG  tablet Take as directed by Coumadin Clinic     Allergies:   Ivp dye [iodinated diagnostic agents]   Past Medical History:  Diagnosis Date  . Allergic rhinitis   . Anemia   . Arthritis    SHOULDER  . Asthma with bronchitis   . Colon polyp   . COPD (chronic obstructive pulmonary disease) (HCC)    Astmatic bronchitis  . Dysrhythmia 08/2012   NEW ONSET ATRIAL FIBRILATION  . Gout    PMH  . Headache(784.0)   . HLD (hyperlipidemia)   . Nephrolithiasis   . Osteoporosis   . RLS (restless legs syndrome)   . Skin cancer (melanoma) ([place]) 2012   Right neck  . Skin cancer, basal cell    Dr Nevada Crane, [facility]    Past Surgical History:  Procedure Laterality Date  . AORTIC VALVE REPLACEMENT N/A 02/20/2013   Procedure: AORTIC VALVE REPLACEMENT (AVR);  Surgeon: Ivin Poot, MD;  Location: [place];  Service: Open Heart Surgery;  Laterality: N/A;  . COLONOSCOPY W/ POLYPECTOMY     Dr Deatra Ina  . CORONARY ARTERY BYPASS GRAFT N/A 02/20/2013   Procedure: CORONARY ARTERY BYPASS GRAFTING (CABG);  Surgeon: Ivin Poot, MD;  Location: [place];  Service: Open Heart Surgery;  Laterality:  N/A;  . FINGER SURGERY     for foreign body  . INGUINAL HERNIA REPAIR    . INTRAOPERATIVE TRANSESOPHAGEAL ECHOCARDIOGRAM N/A 02/20/2013   Procedure: INTRAOPERATIVE TRANSESOPHAGEAL ECHOCARDIOGRAM;  Surgeon: Ivin Poot, MD;  Location: [place];  Service: Open Heart Surgery;  Laterality: N/A;  . LEFT AND RIGHT HEART CATHETERIZATION WITH CORONARY ANGIOGRAM N/A 01/05/2013   Procedure: LEFT AND RIGHT HEART CATHETERIZATION WITH CORONARY ANGIOGRAM;  Surgeon: Blane Ohara, MD;  Location: [facility] CATH LAB;  Service: Cardiovascular;  Laterality: N/A;  . LEG SURGERY  06/2010    Dr.Lawson, for blood clott. Left leg   . NECK SURGERY  12/2010   Melanoma ; [facility]   . NOSE SURGERY     Septal Deviation  . RIGHT HEART CATHETERIZATION N/A 01/23/2013   Procedure: RIGHT HEART CATH;  Surgeon: Jolaine Artist, MD;  Location: [facility] CATH  LAB;  Service: Cardiovascular;  Laterality: N/A;  . TEE WITHOUT CARDIOVERSION N/A 01/23/2013   Procedure: TRANSESOPHAGEAL ECHOCARDIOGRAM (TEE);  Surgeon: Jolaine Artist, MD;  Location: [facility] ENDOSCOPY;  Service: Cardiovascular;  Laterality: N/A;  sign heart cath consent w/tee consent     Social History:  The patient  reports that he quit smoking about 55 years ago. His smoking use included Cigarettes. He has a 45.00 pack-year smoking history. He has never used smokeless tobacco. He reports that he does not drink alcohol or use drugs.   Family History:  The patient's family history includes Allergies in his brother, father, mother, and sister; Allergy (severe) in his brother, brother, father, mother, and sister; Asthma in his father; Cancer in his son; Coronary artery disease in his mother; Diabetes in his brother; Emphysema in his brother; Heart attack (age of onset: 28) in his father; Stroke in his father.    ROS:  Please see the history of present illness. All other systems are reviewed and  Negative to the above problem except as noted.    PHYSICAL EXAM: VS:  BP 118/70   Pulse 69   Ht 6\' 1"  (1.854 m)   Wt 64.9 kg (143 lb 1.9 oz)   BMI 18.88 kg/m   GEN: Thin 83 yo  in no acute distress  HEENT: normal  Neck: no JVD, carotid bruits, or masses Cardiac: RRR; no murmurs, rubs, or gallops,no edema  Respiratory:  clear to auscultation bilaterally, normal work of breathing GI: soft, nontender, nondistended, + BS  No hepatomegaly  MS: no deformity Moving all extremities   Skin: warm and dry, no rash Neuro:  Strength and sensation are intact Psych: euthymic mood, full affect   EKG:  EKG is ordered today.  Atrail flutter   (atypcial) 69 bpm  LAFB  LVH  ANterior MI ST chagnes consistent with early repolarizaion   Lipid Panel    Component Value Date/Time   CHOL 105 05/03/2016 0842   TRIG 73.0 05/03/2016 0842   HDL 31.20 (L) 05/03/2016 0842   CHOLHDL 3 05/03/2016 0842   VLDL 14.6  05/03/2016 0842   LDLCALC 59 05/03/2016 0842      Wt Readings from Last 3 Encounters:  08/16/16 64.9 kg (143 lb 1.9 oz)  07/06/16 68 kg (150 lb)  05/30/16 66.2 kg (146 lb)      ASSESSMENT AND PLAN:  1  CAD   I am not convinced of active angina   Continue current meds  2  Hx AV dz  S/p AVR  3  PAF/flutter  Continue rate control and anticoag  4  HL  Keep on meds  5  Rehab  Pt walked in hallway off office  Appeared weak but OK Uses cane.  Will refer to physical therapy  I will f/u in feb in clinic    Current medicines are reviewed at length with the patient today.  The patient does not have concerns regarding medicines.  Signed, Dorris Carnes, MD  08/18/2016 4:59 PM    [facility] [address], [place], [place]  [address] Phone: [phone]; Fax: [phone]

## 2016-08-20 ENCOUNTER — Ambulatory Visit: Payer: Medicare Other | Admitting: Physical Therapy

## 2016-08-24 ENCOUNTER — Ambulatory Visit: Payer: Medicare Other | Attending: Internal Medicine | Admitting: Physical Therapy

## 2016-08-24 ENCOUNTER — Encounter: Payer: Self-pay | Admitting: Physical Therapy

## 2016-08-24 DIAGNOSIS — R2681 Unsteadiness on feet: Secondary | ICD-10-CM | POA: Insufficient documentation

## 2016-08-24 DIAGNOSIS — R293 Abnormal posture: Secondary | ICD-10-CM | POA: Diagnosis not present

## 2016-08-24 DIAGNOSIS — R2689 Other abnormalities of gait and mobility: Secondary | ICD-10-CM

## 2016-08-24 DIAGNOSIS — R29898 Other symptoms and signs involving the musculoskeletal system: Secondary | ICD-10-CM | POA: Insufficient documentation

## 2016-08-25 NOTE — Therapy (Signed)
[facility] [address] [place], [place], [address] Phone: [phone]   Fax:  [phone]  Physical Therapy Evaluation  Patient Details  Name: Robert Richard MRN: [id number] Date of Birth: 1930/11/03 Referring Provider: Dorris Carnes  Encounter Date: 08/24/2016      PT End of Session - 08/24/16 1700    Visit Number 1   Number of Visits 17   Date for PT Re-Evaluation 10/23/16   Authorization Type U[employer] Medicare  G-code every 10th visit   Authorization Time Period 08/24/16 to 10/23/16   PT Start Time 1405   PT Stop Time 1445   PT Time Calculation (min) 40 min   Activity Tolerance Patient tolerated treatment well   Behavior During Therapy [facility] for tasks assessed/performed      Past Medical History:  Diagnosis Date  . Allergic rhinitis   . Anemia   . Arthritis    SHOULDER  . Asthma with bronchitis   . Colon polyp   . COPD (chronic obstructive pulmonary disease) (HCC)    Astmatic bronchitis  . Dysrhythmia 08/2012   NEW ONSET ATRIAL FIBRILATION  . Gout    PMH  . Headache(784.0)   . HLD (hyperlipidemia)   . Nephrolithiasis   . Osteoporosis   . RLS (restless legs syndrome)   . Skin cancer (melanoma) ([place]) 2012   Right neck  . Skin cancer, basal cell    Dr Nevada Crane, [facility]    Past Surgical History:  Procedure Laterality Date  . AORTIC VALVE REPLACEMENT N/A 02/20/2013   Procedure: AORTIC VALVE REPLACEMENT (AVR);  Surgeon: Ivin Poot, MD;  Location: [place];  Service: Open Heart Surgery;  Laterality: N/A;  . COLONOSCOPY W/ POLYPECTOMY     Dr Deatra Ina  . CORONARY ARTERY BYPASS GRAFT N/A 02/20/2013   Procedure: CORONARY ARTERY BYPASS GRAFTING (CABG);  Surgeon: Ivin Poot, MD;  Location: [place];  Service: Open Heart Surgery;  Laterality: N/A;  . FINGER SURGERY     for foreign body  . INGUINAL HERNIA REPAIR    . INTRAOPERATIVE TRANSESOPHAGEAL ECHOCARDIOGRAM N/A 02/20/2013   Procedure: INTRAOPERATIVE TRANSESOPHAGEAL  ECHOCARDIOGRAM;  Surgeon: Ivin Poot, MD;  Location: [place];  Service: Open Heart Surgery;  Laterality: N/A;  . LEFT AND RIGHT HEART CATHETERIZATION WITH CORONARY ANGIOGRAM N/A 01/05/2013   Procedure: LEFT AND RIGHT HEART CATHETERIZATION WITH CORONARY ANGIOGRAM;  Surgeon: Blane Ohara, MD;  Location: [facility] CATH LAB;  Service: Cardiovascular;  Laterality: N/A;  . LEG SURGERY  06/2010    Dr.Lawson, for blood clott. Left leg   . NECK SURGERY  12/2010   Melanoma ; [facility]   . NOSE SURGERY     Septal Deviation  . RIGHT HEART CATHETERIZATION N/A 01/23/2013   Procedure: RIGHT HEART CATH;  Surgeon: Jolaine Artist, MD;  Location: [facility] CATH LAB;  Service: Cardiovascular;  Laterality: N/A;  . TEE WITHOUT CARDIOVERSION N/A 01/23/2013   Procedure: TRANSESOPHAGEAL ECHOCARDIOGRAM (TEE);  Surgeon: Jolaine Artist, MD;  Location: [facility] ENDOSCOPY;  Service: Cardiovascular;  Laterality: N/A;  sign heart cath consent w/tee consent    There were no vitals filed for this visit.       Subjective Assessment - 08/24/16 1413    Subjective Dr. Harrington Challenger thought I'm not walking like I ought to. We need to work on these legs. Reports he has not walked as well since his heart surgery 3 yrs ago.    Limitations Walking   How long can you walk  comfortably? varies day to day; can walk driveway 3-4 times some days, other days only once; leg fatigue causes him to sit   Patient Stated Goals Walking better; walk without the cane (has used for 3 yrs)   Currently in Pain? No/denies            [facility] PT Assessment - 08/25/16 0001      Assessment   Medical Diagnosis weakness, shuffling gait   Referring Provider Dorris Carnes   Onset Date/Surgical Date --  3 yrs ago, since CABG   Hand Dominance Right   Prior Therapy none     Precautions   Precautions None     Balance Screen   Has the patient fallen in the past 6 months No   Has the patient had a decrease in activity level because of a fear of falling?  No    Is the patient reluctant to leave their home because of a fear of falling?  No     Home Environment   Living Environment Private residence   Living Arrangements Spouse/significant other   Available Help at Discharge Family;Available 24 hours/day   Type of Home House   Home Access Stairs to enter   Entrance Stairs-Number of Steps 3   Entrance Stairs-Rails Left   Home Layout Two level;Laundry or work area in basement   Alternate Therapist, sports of Steps flight   Alternate [place] - single point;Walker - 2 wheels  regular height toilet     Prior Function   Level of Independence Independent with household mobility without device;Independent with community mobility with device  wife mows yard; son uses chainsaw   Vocation Retired   Psychologist, counselling, worked his farm (cattle, horses)   Leisure raise purple martins (birds migrate from [place])      Cognition   Overall Cognitive Status Within Functional Limits for tasks assessed  difficulty subtracting for TUG cognitive     Observation/Other Assessments   Focus on Therapeutic Outcomes (FOTO)  yes     Sensation   Additional Comments light touch intact     Coordination   Heel Shin Test WNL bil     Posture/Postural Control   Posture/Postural Control Postural limitations   Postural Limitations Forward head;Rounded Shoulders;Increased thoracic kyphosis     ROM / Strength   AROM / PROM / Strength AROM;Strength     AROM   Overall AROM  Deficits   Overall AROM Comments WNL except rt ankle DF 20 degrees less than lt     Strength   Overall Strength Deficits   Overall Strength Comments hip flexion 5/5, knee extension 5/5, knee flexion 4/5, ankle DF 5/5     Transfers   Transfers Sit to Stand;Stand to Sit   Sit to Stand 6: Modified independent (Device/Increase time)   Stand to Sit 6: Modified independent (Device/Increase time)   Transfer Cueing scoot to edge of chair to  prevent pushing chair backwards and losing balance    Comments able to stand without use of arms with minguard assist     Ambulation/Gait   Ambulation/Gait Yes   Ambulation/Gait Assistance 6: Modified independent (Device/Increase time)   Ambulation Distance (Feet) 80 Feet  50, 40, 50   Assistive device Straight cane   Gait Pattern Step-through pattern;Decreased arm swing - left;Decreased step length - right;Decreased step length - left;Decreased dorsiflexion - right;Right flexed knee in stance;Left flexed knee in stance;Poor foot clearance - right;Poor foot clearance - left  Ambulation Surface Level   Gait velocity 32.8 ft/13.47 sec=2.44 ft/sec     Standardized Balance Assessment   Standardized Balance Assessment Five Times Sit to Stand;Timed Up and Go Test   Five times sit to stand comments  19.00     Timed Up and Go Test   Normal TUG (seconds) 14.44   Cognitive TUG (seconds) 19            Objective measurements completed on examination: See above findings.                  PT Education - 08/25/16 [id number]    Education provided Yes   Education Details results of PT eval and PT POC   Person(s) Educated Patient   Methods Explanation   Comprehension Verbalized understanding          PT Short Term Goals - 08/25/16 0827      PT SHORT TERM GOAL #1   Title Patient/spouse will demonstrate independence in HEP for balance, ROM, and strengthening. (Target date for all STGs 09/21/16)   Time 4   Period Weeks   Status New     PT SHORT TERM GOAL #2   Title Patient will demonstrate improved LE strength and balance with improved 5 times sit to stand time to <17 seconds.   Time 4   Period Weeks   Status New     PT SHORT TERM GOAL #3   Title Patient will improve gait velocity to >= 2.62 ft/sec (safe community ambulator velocity) while demonstrating appropriate use of assistive device.   Time 4   Period Weeks   Status New     PT SHORT TERM GOAL #4   Title Patient  will improve FGA score by 3 points from his baseline to indicate a lesser risk of falling.    Time 4   Period Weeks   Status New     PT SHORT TERM GOAL #5   Title Patient/spouse will complete education on fall prevention.    Time 4   Period Weeks   Status New           PT Long Term Goals - 08/25/16 0836      PT LONG TERM GOAL #1   Title Patient/spouse will demonstrate independence in updated/final HEP for balance, ROM, and strengthening. (Target date for all LTGs 10/23/16)   Time 8   Period Weeks   Status New     PT LONG TERM GOAL #2   Title Patient will improve TUG normal time to <13.5 seconds, indicative of a lesser risk of falling.    Time 8   Period Weeks   Status New     PT LONG TERM GOAL #3   Title Patient will improve gait velocity to >=2.86 ft/sec (10% less than normal speed for healthy 80-89 yr olds, 3.18 ft/sec).   Time 8   Period Weeks   Status New     PT LONG TERM GOAL #4   Title Patient will improve his FGA score by 2 points compared to score at 4 weeks, indicative of a lesser fall risk.   Time 8   Period Weeks   Status New                Plan - 08/25/16 0815    Clinical Impression Statement 83 yo gentleman presents with difficulty walking and decreased balance that began after his open heart surgery and has slowly declined since that time. He has not fallen, however  presents to PT hoping to reduce his risk of falling. He presents with the problems outlined below and should benefit from PT via the interventions listed below.    History and Personal Factors relevant to plan of care: PMHx-CAD, CABG, PVD, OA, Osteoporosis, COPD, afib, gout, RLS, cerebellar CVA '15; recent neurology consult for leg pain and started on gabapentin (pt reports this has helped); Personal factors: age, learning preferences (needs assistance understanding written medical materials),    Clinical Presentation Evolving   Clinical Presentation due to: decline in gait and balance  has been progressing   Clinical Decision Making Moderate   Rehab Potential Good   Clinical Impairments Affecting Rehab Potential decreased ability to understand written materials (does have a supportive wife that will assist him)   PT Frequency 2x / week   PT Duration 8 weeks   PT Treatment/Interventions ADLs/Self Care Home Management;DME Instruction;Gait training;Stair training;Functional mobility training;Therapeutic activities;Therapeutic exercise;Balance training;Neuromuscular re-education;Patient/family education;Manual techniques;Passive range of motion   PT Next Visit Plan complete FGA and update goal; initiate HEP for balance and increasing rt ankle DF ROM   Consulted and Agree with Plan of Care Patient      Patient will benefit from skilled therapeutic intervention in order to improve the following deficits and impairments:  Abnormal gait, Decreased balance, Decreased knowledge of use of DME, Decreased mobility, Decreased range of motion, Impaired flexibility, Postural dysfunction  Visit Diagnosis: Abnormal posture - Plan: PT PLAN OF CARE CERT/RE-CERT  Other abnormalities of gait and mobility - Plan: PT PLAN OF CARE CERT/RE-CERT  Unsteadiness on feet - Plan: PT PLAN OF CARE CERT/RE-CERT  Other symptoms and signs involving the musculoskeletal system - Plan: PT PLAN OF CARE CERT/RE-CERT      G-Codes - 09/62/83 0853    Functional Assessment Tool Used (Outpatient Only) TUG 14.44, TUG cognitive 19.00, gait velocity 2.44 ft/sec, 5x sit to stand 19.00   Functional Limitation Mobility: Walking and moving around   Mobility: Walking and Moving Around Current Status [phone]) At least 20 percent but less than 40 percent impaired, limited or restricted   Mobility: Walking and Moving Around Goal Status [phone]) At least 1 percent but less than 20 percent impaired, limited or restricted       Problem List Patient Active Problem List   Diagnosis Date Noted  . Prediabetes 05/01/2016  .  Pain in both lower extremities 05/01/2016  . Bilateral hearing loss 05/01/2016  . Migraine 04/26/2015  . Aortic valve replaced 04/21/2015  . Subacute confusional state 12/14/2014  . Dyspnea 09/16/2013  . Pulmonary infiltrates 07/31/2013  . Encounter for therapeutic drug monitoring 03/20/2013  . CAD (coronary artery disease) 03/13/2013  . Atrial fibrillation, controlled ([place]) 03/13/2013  . Protein-calorie malnutrition, severe ([place]) 02/27/2013  . S/P CABG x 3 02/20/2013  . History of embolectomy 09/22/2012  . Renal insufficiency, mild 09/22/2012  . Syncope 09/15/2012  . Peripheral vascular disease, unspecified ([place]) 07/08/2012  . Mitral stenosis 07/27/2010  . Hyperlipidemia 10/25/2009  . Essential hypertension 10/25/2009  . NEPHROLITHIASIS, HX OF 10/25/2009  . SLEEP DISORDER 11/29/2008  . SNORING 10/14/2008  . APNEA 10/05/2008  . SKIN CANCER, HX OF 10/05/2008  . RESTLESS LEG SYNDROME 09/11/2007  . Allergic rhinitis, cause unspecified 09/11/2007  . GOUT 03/15/2006  . ASTHMA 03/15/2006  . COPD 03/15/2006  . Osteoporosis 03/15/2006  . COLONIC POLYPS, HX OF 03/15/2006    Rexanne Mano, PT 08/25/2016, 9:00 AM  [facility] [address] [place], [place],  [address] Phone: [phone]   Fax:  [phone]  Name: Robert Richard MRN: [id number] Date of Birth: [birth year]

## 2016-08-28 ENCOUNTER — Ambulatory Visit: Payer: Medicare Other | Admitting: Physical Therapy

## 2016-08-28 ENCOUNTER — Encounter: Payer: Self-pay | Admitting: Physical Therapy

## 2016-08-28 DIAGNOSIS — R29898 Other symptoms and signs involving the musculoskeletal system: Secondary | ICD-10-CM | POA: Diagnosis not present

## 2016-08-28 DIAGNOSIS — R293 Abnormal posture: Secondary | ICD-10-CM | POA: Diagnosis not present

## 2016-08-28 DIAGNOSIS — R2681 Unsteadiness on feet: Secondary | ICD-10-CM

## 2016-08-28 DIAGNOSIS — R2689 Other abnormalities of gait and mobility: Secondary | ICD-10-CM | POA: Diagnosis not present

## 2016-08-28 NOTE — Therapy (Signed)
[facility] [address] [place], [place], [address] Phone: [phone]   Fax:  [phone]  Physical Therapy Treatment  Patient Details  Name: Robert Richard MRN: [id number] Date of Birth: 01-09-1931 Referring Provider: Dorris Carnes  Encounter Date: 08/28/2016      PT End of Session - 08/28/16 1410    Visit Number 2   Number of Visits 17   Date for PT Re-Evaluation 10/23/16   Authorization Type UHC Medicare  G-code every 10th visit   Authorization Time Period 08/24/16 to 10/23/16   PT Start Time 1320   PT Stop Time 1402   PT Time Calculation (min) 42 min   Equipment Utilized During Treatment Gait belt   Activity Tolerance Patient tolerated treatment well   Behavior During Therapy [facility] for tasks assessed/performed      Past Medical History:  Diagnosis Date  . Allergic rhinitis   . Anemia   . Arthritis    SHOULDER  . Asthma with bronchitis   . Colon polyp   . COPD (chronic obstructive pulmonary disease) (HCC)    Astmatic bronchitis  . Dysrhythmia 08/2012   NEW ONSET ATRIAL FIBRILATION  . Gout    PMH  . Headache(784.0)   . HLD (hyperlipidemia)   . Nephrolithiasis   . Osteoporosis   . RLS (restless legs syndrome)   . Skin cancer (melanoma) ([place]) 2012   Right neck  . Skin cancer, basal cell    Dr Nevada Crane, [facility]    Past Surgical History:  Procedure Laterality Date  . AORTIC VALVE REPLACEMENT N/A 02/20/2013   Procedure: AORTIC VALVE REPLACEMENT (AVR);  Surgeon: Ivin Poot, MD;  Location: [place];  Service: Open Heart Surgery;  Laterality: N/A;  . COLONOSCOPY W/ POLYPECTOMY     Dr Deatra Ina  . CORONARY ARTERY BYPASS GRAFT N/A 02/20/2013   Procedure: CORONARY ARTERY BYPASS GRAFTING (CABG);  Surgeon: Ivin Poot, MD;  Location: [place];  Service: Open Heart Surgery;  Laterality: N/A;  . FINGER SURGERY     for foreign body  . INGUINAL HERNIA REPAIR    . INTRAOPERATIVE TRANSESOPHAGEAL ECHOCARDIOGRAM N/A 02/20/2013    Procedure: INTRAOPERATIVE TRANSESOPHAGEAL ECHOCARDIOGRAM;  Surgeon: Ivin Poot, MD;  Location: [place];  Service: Open Heart Surgery;  Laterality: N/A;  . LEFT AND RIGHT HEART CATHETERIZATION WITH CORONARY ANGIOGRAM N/A 01/05/2013   Procedure: LEFT AND RIGHT HEART CATHETERIZATION WITH CORONARY ANGIOGRAM;  Surgeon: Blane Ohara, MD;  Location: [facility] CATH LAB;  Service: Cardiovascular;  Laterality: N/A;  . LEG SURGERY  06/2010    Dr.Lawson, for blood clott. Left leg   . NECK SURGERY  12/2010   Melanoma ; [facility]   . NOSE SURGERY     Septal Deviation  . RIGHT HEART CATHETERIZATION N/A 01/23/2013   Procedure: RIGHT HEART CATH;  Surgeon: Jolaine Artist, MD;  Location: [facility] CATH LAB;  Service: Cardiovascular;  Laterality: N/A;  . TEE WITHOUT CARDIOVERSION N/A 01/23/2013   Procedure: TRANSESOPHAGEAL ECHOCARDIOGRAM (TEE);  Surgeon: Jolaine Artist, MD;  Location: [facility] ENDOSCOPY;  Service: Cardiovascular;  Laterality: N/A;  sign heart cath consent w/tee consent    There were no vitals filed for this visit.      Subjective Assessment - 08/28/16 1320    Subjective No falls or pain to report today.    How long can you walk comfortably? varies day to day; can walk driveway 3-4 times some days, other days only once; leg fatigue causes him to  sit   Patient Stated Goals Walking better; walk without the cane (has used for 3 yrs)   Currently in Pain? No/denies            [facility] PT Assessment - 08/28/16 1322      Functional Gait  Assessment   Gait assessed  Yes   Gait Level Surface Walks 20 ft, slow speed, abnormal gait pattern, evidence for imbalance or deviates 10-15 in outside of the 12 in walkway width. Requires more than 7 sec to ambulate 20 ft.   Change in Gait Speed Able to change speed, demonstrates mild gait deviations, deviates 6-10 in outside of the 12 in walkway width, or no gait deviations, unable to achieve a major change in velocity, or uses a change in velocity, or uses  an assistive device.   Gait with Horizontal Head Turns Performs head turns smoothly with no change in gait. Deviates no more than 6 in outside 12 in walkway width   Gait with Vertical Head Turns Performs task with slight change in gait velocity (eg, minor disruption to smooth gait path), deviates 6 - 10 in outside 12 in walkway width or uses assistive device   Gait and Pivot Turn Pivot turns safely in greater than 3 sec and stops with no loss of balance, or pivot turns safely within 3 sec and stops with mild imbalance, requires small steps to catch balance.   Step Over Obstacle Is able to step over 2 stacked shoe boxes taped together (9 in total height) without changing gait speed. No evidence of imbalance.   Gait with Narrow Base of Support Ambulates less than 4 steps heel to toe or cannot perform without assistance.   Gait with Eyes Closed Walks 20 ft, slow speed, abnormal gait pattern, evidence for imbalance, deviates 10-15 in outside 12 in walkway width. Requires more than 9 sec to ambulate 20 ft.   Ambulating Backwards Cannot walk 20 ft without assistance, severe gait deviations or imbalance, deviates greater than 15 in outside 12 in walkway width or will not attempt task.   Steps Alternating feet, must use rail.   Total Score 16               PT Education - 08/28/16 1410    Education provided Yes   Education Details Discussed HEP and provided and copy to pt.    Person(s) Educated Patient   Methods Explanation;Demonstration;Handout   Comprehension Verbalized understanding;Returned demonstration          PT Short Term Goals - 08/28/16 1418      PT SHORT TERM GOAL #1   Title Patient/spouse will demonstrate independence in HEP for balance, ROM, and strengthening. (Target date for all STGs 09/21/16)   Time 4   Period Weeks   Status New     PT SHORT TERM GOAL #2   Title Patient will demonstrate improved LE strength and balance with improved 5 times sit to stand time to <17  seconds.   Time 4   Period Weeks   Status New     PT SHORT TERM GOAL #3   Title Patient will improve gait velocity to >= 2.62 ft/sec (safe community ambulator velocity) while demonstrating appropriate use of assistive device.   Time 4   Period Weeks   Status New     PT SHORT TERM GOAL #4   Title Patient will improve FGA score by 3 points from his baseline to indicate a lesser risk of falling.    Baseline  08/28/16 scored 16/30    Time 4   Period Weeks   Status New     PT SHORT TERM GOAL #5   Title Patient/spouse will complete education on fall prevention.    Time 4   Period Weeks   Status New           PT Long Term Goals - 08/25/16 0836      PT LONG TERM GOAL #1   Title Patient/spouse will demonstrate independence in updated/final HEP for balance, ROM, and strengthening. (Target date for all LTGs 10/23/16)   Time 8   Period Weeks   Status New     PT LONG TERM GOAL #2   Title Patient will improve TUG normal time to <13.5 seconds, indicative of a lesser risk of falling.    Time 8   Period Weeks   Status New     PT LONG TERM GOAL #3   Title Patient will improve gait velocity to >=2.86 ft/sec (10% less than normal speed for healthy 80-89 yr olds, 3.18 ft/sec).   Time 8   Period Weeks   Status New     PT LONG TERM GOAL #4   Title Patient will improve his FGA score by 2 points compared to score at 4 weeks, indicative of a lesser fall risk.   Time 8   Period Weeks   Status New               Plan - 08/28/16 1411    Clinical Impression Statement Today's session focused on assessing FGA  as well as creating and demonstrating HEP exercises based on intial eval and today's score. Patient scored 16/30 on the FGA demonstrating a high fall risk. He completed gatroc stretches to help with his R ankle ROM. We also completed dynamic gait activities at the counter to assess patient's ability to complete at home. All exercises were sent home with patient in his HEP. Patient  would benefit from continued PT to address set goals.    Clinical Impairments Affecting Rehab Potential decreased ability to understand written materials (does have a supportive wife that will assist him)   PT Frequency 2x / week   PT Duration 8 weeks   PT Treatment/Interventions ADLs/Self Care Home Management;DME Instruction;Gait training;Stair training;Functional mobility training;Therapeutic activities;Therapeutic exercise;Balance training;Neuromuscular re-education;Patient/family education;Manual techniques;Passive range of motion   PT Next Visit Plan Work on posture, LE strengthening, high level balance and gait possibly on compliant/outdoor surfaces,    Consulted and Agree with Plan of Care Patient      Patient will benefit from skilled therapeutic intervention in order to improve the following deficits and impairments:  Abnormal gait, Decreased balance, Decreased knowledge of use of DME, Decreased mobility, Decreased range of motion, Impaired flexibility, Postural dysfunction  Visit Diagnosis: Abnormal posture  Other abnormalities of gait and mobility  Unsteadiness on feet  Other symptoms and signs involving the musculoskeletal system     Problem List Patient Active Problem List   Diagnosis Date Noted  . Prediabetes 05/01/2016  . Pain in both lower extremities 05/01/2016  . Bilateral hearing loss 05/01/2016  . Migraine 04/26/2015  . Aortic valve replaced 04/21/2015  . Subacute confusional state 12/14/2014  . Dyspnea 09/16/2013  . Pulmonary infiltrates 07/31/2013  . Encounter for therapeutic drug monitoring 03/20/2013  . CAD (coronary artery disease) 03/13/2013  . Atrial fibrillation, controlled ([place]) 03/13/2013  . Protein-calorie malnutrition, severe ([place]) 02/27/2013  . S/P CABG x 3 02/20/2013  . History of embolectomy 09/22/2012  .  Renal insufficiency, mild 09/22/2012  . Syncope 09/15/2012  . Peripheral vascular disease, unspecified ([place]) 07/08/2012  . Mitral  stenosis 07/27/2010  . Hyperlipidemia 10/25/2009  . Essential hypertension 10/25/2009  . NEPHROLITHIASIS, HX OF 10/25/2009  . SLEEP DISORDER 11/29/2008  . SNORING 10/14/2008  . APNEA 10/05/2008  . SKIN CANCER, HX OF 10/05/2008  . RESTLESS LEG SYNDROME 09/11/2007  . Allergic rhinitis, cause unspecified 09/11/2007  . GOUT 03/15/2006  . ASTHMA 03/15/2006  . COPD 03/15/2006  . Osteoporosis 03/15/2006  . COLONIC POLYPS, HX OF 03/15/2006    Skip Estimable, SPTA 08/28/2016, 2:25 PM  [facility] [address] [place], [place], [address] Phone: [phone]   Fax:  [phone]  Name: THERMON ZULAUF MRN: [id number] Date of Birth: November 21, 1930

## 2016-08-28 NOTE — Patient Instructions (Addendum)
Hamstring Stretch, Seated (Strap, Two Chairs)    Sit with one leg extended onto facing chair. Loop strap over outstretched foot at ball of big toe. Lengthen spine. Hold for __15__ breaths. Repeat __2__ times each leg.  Copyright  VHI. All rights reserved.   Calf / Gastoc: Runners' Stretch I    One leg back and straight, other forward and bent supporting weight, lean forward, gently stretching calf of back leg. Hold _15___ seconds. Repeat with other leg. Repeat ___2_ times. Do _2-3___ sessions per day.  Copyright  VHI. All rights reserved.     LOOK FORWARD and [place]    "I love a Parade" Lift   At counter for balance as needed: high knee marching forward and then backward. 3 second pauses with each knee lift.  Repeat 3 laps each way. Do _1-2_ sessions per day. http://gt2.exer.us/345     Copyright  VHI. All rights reserved.  Walking on Heels   At counter: Walk on heels forward while continuing on a straight path, and then walk on heels backward to starting position. Repeat for 3 laps each way. Do _1-2__ sessions per day.  Copyright  VHI. All rights reserved.  Walking on Toes   At counter for balance as needed: Walk on toes forward while continuing on a straight path, and then backwards on toes to starting position. Repeat 3 laps each way. Do _1-2___ sessions per day.  Copyright  VHI. All rights reserved.  Feet Heel-Toe "Tandem"   At counter: Arms at sides, walk a straight line forward bringing one foot directly in front of the other, and then a straight line backwards bringing one foot directly behind the other one.  Repeat for _3 laps each way. Do _1-2_ sessions per day.

## 2016-08-29 DIAGNOSIS — C434 Malignant melanoma of scalp and neck: Secondary | ICD-10-CM | POA: Diagnosis not present

## 2016-08-30 ENCOUNTER — Ambulatory Visit: Payer: Medicare Other | Admitting: Physical Therapy

## 2016-08-30 ENCOUNTER — Encounter: Payer: Self-pay | Admitting: Physical Therapy

## 2016-08-30 DIAGNOSIS — R293 Abnormal posture: Secondary | ICD-10-CM

## 2016-08-30 DIAGNOSIS — R2689 Other abnormalities of gait and mobility: Secondary | ICD-10-CM | POA: Diagnosis not present

## 2016-08-30 DIAGNOSIS — R2681 Unsteadiness on feet: Secondary | ICD-10-CM | POA: Diagnosis not present

## 2016-08-30 DIAGNOSIS — R29898 Other symptoms and signs involving the musculoskeletal system: Secondary | ICD-10-CM | POA: Diagnosis not present

## 2016-08-30 NOTE — Patient Instructions (Signed)
Bridging: with Straight Leg Raise    With legs bent, lift buttocks off the bed. Then slowly extend right knee, keeping stomach tight. Put right foot down and straighten left knee out. Put left foot down and lower hips to bed.  Repeat __10__ times per set. Do __2__ sets per session. Do _1___ sessions per day.  http://orth.exer.us/1104   Copyright  VHI. All rights reserved.   Thoracic Self-Mobilization (Supine)    With rolled towel placed lengthwise between your shoulder blades, lie back on towel with arms outstretched. Hold ___2 minutes. Relax. Repeat __2__ times per set. Do __1__ sets per session. http://orth.exer.us/1000   Copyright  VHI. All rights reserved.   Hip Side Kick    Holding the counter for balance, keep legs shoulder width apart and toes pointed forward.  Lift a leg out to side, keeping knee straight. Do not lean. Repeat using other leg. Repeat ___20_ times. Do __1__ sessions per day.  http://gt2.exer.us/342   Copyright  VHI. All rights reserved.  Hip Backward Kick    Using the counter for balance, keep legs shoulder width apart and toes pointed forward. Slowly extend one leg back, keeping knee straight. Do not lean forward. Repeat with other leg. Repeat __20__ times. Do ___1_ sessions per day.  http://gt2.exer.us/340   Copyright  VHI. All rights reserved.

## 2016-08-30 NOTE — Therapy (Signed)
[facility] [address] [place], [place], [address] Phone: [phone]   Fax:  [phone]  Physical Therapy Treatment  Patient Details  Name: Robert Richard MRN: [id number] Date of Birth: 02-14-1931 Referring Provider: Dorris Carnes  Encounter Date: 08/30/2016      PT End of Session - 08/30/16 2141    Visit Number 3   Number of Visits 17   Date for PT Re-Evaluation 10/23/16   Authorization Type UHC Medicare  G-code every 10th visit   Authorization Time Period 08/24/16 to 10/23/16   PT Start Time 1017   PT Stop Time 1101   PT Time Calculation (min) 44 min   Equipment Utilized During Treatment Gait belt   Activity Tolerance Patient tolerated treatment well   Behavior During Therapy [facility] for tasks assessed/performed      Past Medical History:  Diagnosis Date  . Allergic rhinitis   . Anemia   . Arthritis    SHOULDER  . Asthma with bronchitis   . Colon polyp   . COPD (chronic obstructive pulmonary disease) (HCC)    Astmatic bronchitis  . Dysrhythmia 08/2012   NEW ONSET ATRIAL FIBRILATION  . Gout    PMH  . Headache(784.0)   . HLD (hyperlipidemia)   . Nephrolithiasis   . Osteoporosis   . RLS (restless legs syndrome)   . Skin cancer (melanoma) ([place]) 2012   Right neck  . Skin cancer, basal cell    Dr Nevada Crane, [facility]    Past Surgical History:  Procedure Laterality Date  . AORTIC VALVE REPLACEMENT N/A 02/20/2013   Procedure: AORTIC VALVE REPLACEMENT (AVR);  Surgeon: Ivin Poot, MD;  Location: [facility];  Service: Open Heart Surgery;  Laterality: N/A;  . COLONOSCOPY W/ POLYPECTOMY     Dr Deatra Ina  . CORONARY ARTERY BYPASS GRAFT N/A 02/20/2013   Procedure: CORONARY ARTERY BYPASS GRAFTING (CABG);  Surgeon: Ivin Poot, MD;  Location: [facility];  Service: Open Heart Surgery;  Laterality: N/A;  . FINGER SURGERY     for foreign body  . INGUINAL HERNIA REPAIR    . INTRAOPERATIVE TRANSESOPHAGEAL ECHOCARDIOGRAM N/A 02/20/2013    Procedure: INTRAOPERATIVE TRANSESOPHAGEAL ECHOCARDIOGRAM;  Surgeon: Ivin Poot, MD;  Location: [facility];  Service: Open Heart Surgery;  Laterality: N/A;  . LEFT AND RIGHT HEART CATHETERIZATION WITH CORONARY ANGIOGRAM N/A 01/05/2013   Procedure: LEFT AND RIGHT HEART CATHETERIZATION WITH CORONARY ANGIOGRAM;  Surgeon: Blane Ohara, MD;  Location: [facility] CATH LAB;  Service: Cardiovascular;  Laterality: N/A;  . LEG SURGERY  06/2010    Dr.Lawson, for blood clott. Left leg   . NECK SURGERY  12/2010   Melanoma ; [facility]   . NOSE SURGERY     Septal Deviation  . RIGHT HEART CATHETERIZATION N/A 01/23/2013   Procedure: RIGHT HEART CATH;  Surgeon: Jolaine Artist, MD;  Location: [facility] CATH LAB;  Service: Cardiovascular;  Laterality: N/A;  . TEE WITHOUT CARDIOVERSION N/A 01/23/2013   Procedure: TRANSESOPHAGEAL ECHOCARDIOGRAM (TEE);  Surgeon: Jolaine Artist, MD;  Location: [facility] ENDOSCOPY;  Service: Cardiovascular;  Laterality: N/A;  sign heart cath consent w/tee consent    There were no vitals filed for this visit.      Subjective Assessment - 08/30/16 1023    Subjective No falls or pain to report today. Hardest exercise was the tandem walking.    How long can you walk comfortably? varies day to day; can walk driveway 3-4 times some days, other days only  once; leg fatigue causes him to sit   Patient Stated Goals Walking better; walk without the cane (has used for 3 yrs)   Currently in Pain? No/denies                         [facility] Adult PT Treatment/Exercise - 08/30/16 1109      Bed Mobility   Bed Mobility Rolling Right;Rolling Left;Supine to Sit;Sit to Supine   Rolling Right 6: Modified independent (Device/Increase time)   Rolling Left 6: Modified independent (Device/Increase time)   Supine to Sit 6: Modified independent (Device/Increase time)   Sit to Supine 6: Modified independent (Device/Increase time)     Transfers   Transfers Sit to Stand;Stand to Sit   Sit to  Stand 6: Modified independent (Device/Increase time);With upper extremity assist   Stand to Sit 6: Modified independent (Device/Increase time);With upper extremity assist     Ambulation/Gait   Ambulation/Gait Assistance 5: Supervision   Ambulation/Gait Assistance Details vc for proper use of cane and upright posture   Ambulation Distance (Feet) 120 Feet  x 2;  50   Assistive device Straight cane;None   Gait Pattern Step-through pattern;Decreased arm swing - left;Decreased step length - right;Decreased step length - left;Decreased dorsiflexion - right;Right flexed knee in stance;Left flexed knee in stance;Poor foot clearance - right;Poor foot clearance - left;Decreased arm swing - right;Trunk flexed   Ambulation Surface Level;Indoor     Posture/Postural Control   Posture/Postural Control Postural limitations   Postural Limitations Forward head;Rounded Shoulders;Increased thoracic kyphosis   Posture Comments supine stretch with rolled towel along spine with arms out to side     Exercises   Exercises Knee/Hip;Other Exercises     Knee/Hip Exercises: Standing   Hip Abduction Stengthening;Both;2 sets;10 reps   Hip Extension Stengthening;Both;2 sets;20 reps     Knee/Hip Exercises: Supine   Bridges Strengthening;Both;2 sets Bridge with knee extension                PT Education - 08/30/16 2140    Education provided Yes   Education Details see additions to HEP   Person(s) Educated Patient   Methods Explanation;Demonstration;Tactile cues;Verbal cues   Comprehension Returned demonstration;Verbalized understanding;Need further instruction          PT Short Term Goals - 08/28/16 1418      PT SHORT TERM GOAL #1   Title Patient/spouse will demonstrate independence in HEP for balance, ROM, and strengthening. (Target date for all STGs 09/21/16)   Time 4   Period Weeks   Status New     PT SHORT TERM GOAL #2   Title Patient will demonstrate improved LE strength and balance with  improved 5 times sit to stand time to <17 seconds.   Time 4   Period Weeks   Status New     PT SHORT TERM GOAL #3   Title Patient will improve gait velocity to >= 2.62 ft/sec (safe community ambulator velocity) while demonstrating appropriate use of assistive device.   Time 4   Period Weeks   Status New     PT SHORT TERM GOAL #4   Title Patient will improve FGA score by 3 points from his baseline to indicate a lesser risk of falling.    Baseline 08/28/16 scored 16/30    Time 4   Period Weeks   Status New     PT SHORT TERM GOAL #5   Title Patient/spouse will complete education on fall prevention.  Time 4   Period Weeks   Status New           PT Long Term Goals - 08/25/16 0836      PT LONG TERM GOAL #1   Title Patient/spouse will demonstrate independence in updated/final HEP for balance, ROM, and strengthening. (Target date for all LTGs 10/23/16)   Time 8   Period Weeks   Status New     PT LONG TERM GOAL #2   Title Patient will improve TUG normal time to <13.5 seconds, indicative of a lesser risk of falling.    Time 8   Period Weeks   Status New     PT LONG TERM GOAL #3   Title Patient will improve gait velocity to >=2.86 ft/sec (10% less than normal speed for healthy 80-89 yr olds, 3.18 ft/sec).   Time 8   Period Weeks   Status New     PT LONG TERM GOAL #4   Title Patient will improve his FGA score by 2 points compared to score at 4 weeks, indicative of a lesser fall risk.   Time 8   Period Weeks   Status New               Plan - 08/30/16 2142    Clinical Impression Statement Session focused on safe transfers including use of air cast vs AFO for both feet for joint protection. Husband present throughout and very attentive.    Rehab Potential Good   Clinical Impairments Affecting Rehab Potential decreased ability to understand written materials (does have a supportive wife that will assist him)   PT Frequency 2x / week   PT Duration 8 weeks   PT  Treatment/Interventions ADLs/Self Care Home Management;DME Instruction;Gait training;Stair training;Functional mobility training;Therapeutic activities;Therapeutic exercise;Balance training;Neuromuscular re-education;Patient/family education;Manual techniques;Passive range of motion   PT Next Visit Plan Work on posture, LE strengthening, high level balance and gait possibly on compliant/outdoor surfaces,    Consulted and Agree with Plan of Care Patient      Patient will benefit from skilled therapeutic intervention in order to improve the following deficits and impairments:  Abnormal gait, Decreased balance, Decreased knowledge of use of DME, Decreased mobility, Decreased range of motion, Impaired flexibility, Postural dysfunction  Visit Diagnosis: Abnormal posture  Other abnormalities of gait and mobility     Problem List Patient Active Problem List   Diagnosis Date Noted  . Prediabetes 05/01/2016  . Pain in both lower extremities 05/01/2016  . Bilateral hearing loss 05/01/2016  . Migraine 04/26/2015  . Aortic valve replaced 04/21/2015  . Subacute confusional state 12/14/2014  . Dyspnea 09/16/2013  . Pulmonary infiltrates 07/31/2013  . Encounter for therapeutic drug monitoring 03/20/2013  . CAD (coronary artery disease) 03/13/2013  . Atrial fibrillation, controlled ([place]) 03/13/2013  . Protein-calorie malnutrition, severe ([place]) 02/27/2013  . S/P CABG x 3 02/20/2013  . History of embolectomy 09/22/2012  . Renal insufficiency, mild 09/22/2012  . Syncope 09/15/2012  . Peripheral vascular disease, unspecified ([place]) 07/08/2012  . Mitral stenosis 07/27/2010  . Hyperlipidemia 10/25/2009  . Essential hypertension 10/25/2009  . NEPHROLITHIASIS, HX OF 10/25/2009  . SLEEP DISORDER 11/29/2008  . SNORING 10/14/2008  . APNEA 10/05/2008  . SKIN CANCER, HX OF 10/05/2008  . RESTLESS LEG SYNDROME 09/11/2007  . Allergic rhinitis, cause unspecified 09/11/2007  . GOUT 03/15/2006  . ASTHMA  03/15/2006  . COPD 03/15/2006  . Osteoporosis 03/15/2006  . COLONIC POLYPS, HX OF 03/15/2006    Rexanne Mano, PT 08/30/2016,  9:46 PM  [facility] [address] [place], [place], [address] Phone: [phone]   Fax:  [phone]  Name: Robert Richard MRN: [id number] Date of Birth: 1930/08/13

## 2016-09-03 ENCOUNTER — Encounter: Payer: Self-pay | Admitting: Physical Therapy

## 2016-09-03 ENCOUNTER — Ambulatory Visit: Payer: Medicare Other | Admitting: Physical Therapy

## 2016-09-03 DIAGNOSIS — R29898 Other symptoms and signs involving the musculoskeletal system: Secondary | ICD-10-CM | POA: Diagnosis not present

## 2016-09-03 DIAGNOSIS — R2681 Unsteadiness on feet: Secondary | ICD-10-CM | POA: Diagnosis not present

## 2016-09-03 DIAGNOSIS — R293 Abnormal posture: Secondary | ICD-10-CM | POA: Diagnosis not present

## 2016-09-03 DIAGNOSIS — R2689 Other abnormalities of gait and mobility: Secondary | ICD-10-CM

## 2016-09-03 NOTE — Therapy (Signed)
[facility] [address] [place], [place], [address] Phone: [phone]   Fax:  [phone]  Physical Therapy Treatment  Patient Details  Name: Robert Richard MRN: [id number] Date of Birth: 1930/04/03 Referring Provider: Dorris Carnes  Encounter Date: 09/03/2016      PT End of Session - 09/03/16 1644    Visit Number 4   Number of Visits 17   Date for PT Re-Evaluation 10/23/16   Authorization Type UHC Medicare  G-code every 10th visit   Authorization Time Period 08/24/16 to 10/23/16   PT Start Time 1531   PT Stop Time 1614   PT Time Calculation (min) 43 min   Equipment Utilized During Treatment Gait belt   Activity Tolerance Patient tolerated treatment well   Behavior During Therapy [facility] for tasks assessed/performed      Past Medical History:  Diagnosis Date  . Allergic rhinitis   . Anemia   . Arthritis    SHOULDER  . Asthma with bronchitis   . Colon polyp   . COPD (chronic obstructive pulmonary disease) (HCC)    Astmatic bronchitis  . Dysrhythmia 08/2012   NEW ONSET ATRIAL FIBRILATION  . Gout    PMH  . Headache(784.0)   . HLD (hyperlipidemia)   . Nephrolithiasis   . Osteoporosis   . RLS (restless legs syndrome)   . Skin cancer (melanoma) ([place]) 2012   Right neck  . Skin cancer, basal cell    Dr Nevada Crane, [facility]    Past Surgical History:  Procedure Laterality Date  . AORTIC VALVE REPLACEMENT N/A 02/20/2013   Procedure: AORTIC VALVE REPLACEMENT (AVR);  Surgeon: Ivin Poot, MD;  Location: [facility];  Service: Open Heart Surgery;  Laterality: N/A;  . COLONOSCOPY W/ POLYPECTOMY     Dr Deatra Ina  . CORONARY ARTERY BYPASS GRAFT N/A 02/20/2013   Procedure: CORONARY ARTERY BYPASS GRAFTING (CABG);  Surgeon: Ivin Poot, MD;  Location: [place];  Service: Open Heart Surgery;  Laterality: N/A;  . FINGER SURGERY     for foreign body  . INGUINAL HERNIA REPAIR    . INTRAOPERATIVE TRANSESOPHAGEAL ECHOCARDIOGRAM N/A 02/20/2013    Procedure: INTRAOPERATIVE TRANSESOPHAGEAL ECHOCARDIOGRAM;  Surgeon: Ivin Poot, MD;  Location: [facility];  Service: Open Heart Surgery;  Laterality: N/A;  . LEFT AND RIGHT HEART CATHETERIZATION WITH CORONARY ANGIOGRAM N/A 01/05/2013   Procedure: LEFT AND RIGHT HEART CATHETERIZATION WITH CORONARY ANGIOGRAM;  Surgeon: Blane Ohara, MD;  Location[facility] C[facility] LAB;  Service: Cardiovascular;  Laterality: N/A;  . LEG SURGERY  06/2010    Dr.Lawson, for blood clott. Left leg   . NECK SURGERY  12/2010   Melanoma ; [facility]   . NOSE SURGERY     Septal Deviation  . RIGHT HEART CATHETERIZATION N/A 01/23/2013   Procedure: RIGHT HEART CATH;  Surgeon: Jolaine Artist, MD;  Location: [facility] CATH LAB;  Service: Cardiovascular;  Laterality: N/A;  . TEE WITHOUT CARDIOVERSION N/A 01/23/2013   Procedure: TRANSESOPHAGEAL ECHOCARDIOGRAM (TEE);  Surgeon: Jolaine Artist, MD;  Location: [facility] ENDOSCOPY;  Service: Cardiovascular;  Laterality: N/A;  sign heart cath consent w/tee consent    There were no vitals filed for this visit.      Subjective Assessment - 09/03/16 1534    Subjective Patient reports walking a lot yesterday out at [facility]. He stated that he had no trouble.    Patient Stated Goals Walking better; walk without the cane (has used for 3 yrs)  Currently in Pain? No/denies             [facility] Adult PT Treatment/Exercise - 09/03/16 1621      Ambulation/Gait   Ambulation/Gait Yes   Ambulation/Gait Assistance 5: Supervision   Ambulation/Gait Assistance Details cues for posture and to look forward when walking    Ambulation Distance (Feet) 207 Feet  x3, 115 ft    Assistive device Straight cane   Gait Pattern Step-through pattern;Decreased arm swing - left;Decreased step length - right;Decreased step length - left;Decreased dorsiflexion - right;Right flexed knee in stance;Left flexed knee in stance;Poor foot clearance - right;Poor foot clearance - left;Decreased arm swing -  right;Trunk flexed   Ambulation Surface Level;Indoor     Posture/Postural Control   Posture/Postural Control Postural limitations   Postural Limitations Forward head;Rounded Shoulders;Increased thoracic kyphosis   Posture Comments scapular retraction 10x with 3 sec squeeze, seated rows with red theraband 2x10 with multimodal cues to correct form during exercise      Neuro Re-ed    Neuro Re-ed Details  in corner with chair in front for safety, on two pillows EO static balance 3x30sec, min guard with cues to shift hips when LOB; EC static balance 3x30sec, head turns right <> left, up <> down, diagonals 10x  with cues for posture and weight shifting, min assist; on airex stepping off and then backwards back on 10x with cues for posture and weight shifting, min assist      Exercises   Exercises Knee/Hip;Lumbar     Lumbar Exercises: Supine   Bridge 10 reps;3 seconds   Other Supine Lumbar Exercises bridge with unilateral knee extension, 5x each leg, tactile cues to stabilize hips and to not shift to one side      Knee/Hip Exercises: Standing   Hip Abduction Stengthening;Both;2 sets;10 reps  second set with green theraband   Abduction Limitations cues to keep upright posture    Hip Extension Stengthening;Both;2 sets;10 reps  second set was with a green theraband      Knee/Hip Exercises: Seated   Sit to Sand 1 set;10 reps;without UE support  cues to correct posture with hips forward and shoulders back            PT Education - 09/03/16 1644    Education provided Yes   Education Details added green theraband to standing hip exercises    Person(s) Educated Patient   Methods Explanation;Demonstration   Comprehension Verbalized understanding;Returned demonstration          PT Short Term Goals - 08/28/16 1418      PT SHORT TERM GOAL #1   Title Patient/spouse will demonstrate independence in HEP for balance, ROM, and strengthening. (Target date for all STGs 09/21/16)   Time 4    Period Weeks   Status New     PT SHORT TERM GOAL #2   Title Patient will demonstrate improved LE strength and balance with improved 5 times sit to stand time to <17 seconds.   Time 4   Period Weeks   Status New     PT SHORT TERM GOAL #3   Title Patient will improve gait velocity to >= 2.62 ft/sec (safe community ambulator velocity) while demonstrating appropriate use of assistive device.   Time 4   Period Weeks   Status New     PT SHORT TERM GOAL #4   Title Patient will improve FGA score by 3 points from his baseline to indicate a lesser risk of falling.    Baseline  08/28/16 scored 16/30    Time 4   Period Weeks   Status New     PT SHORT TERM GOAL #5   Title Patient/spouse will complete education on fall prevention.    Time 4   Period Weeks   Status New           PT Long Term Goals - 08/25/16 0836      PT LONG TERM GOAL #1   Title Patient/spouse will demonstrate independence in updated/final HEP for balance, ROM, and strengthening. (Target date for all LTGs 10/23/16)   Time 8   Period Weeks   Status New     PT LONG TERM GOAL #2   Title Patient will improve TUG normal time to <13.5 seconds, indicative of a lesser risk of falling.    Time 8   Period Weeks   Status New     PT LONG TERM GOAL #3   Title Patient will improve gait velocity to >=2.86 ft/sec (10% less than normal speed for healthy 80-89 yr olds, 3.18 ft/sec).   Time 8   Period Weeks   Status New     PT LONG TERM GOAL #4   Title Patient will improve his FGA score by 2 points compared to score at 4 weeks, indicative of a lesser fall risk.   Time 8   Period Weeks   Status New               Plan - 09/03/16 1645    Clinical Impression Statement Today's session focused on gait, balance and LE strengthing. Patient tolerated treatment well and seem to complete exercises with ease. Added a green theraband to standing hip exercises to increase difficulty. Completed shoulder/scapular strengthening  exercises to help with posture and with max verbal cues and demonstration was able to complete them. Patient would benefit from continued PT to address remaining goals.    Clinical Impairments Affecting Rehab Potential decreased ability to understand written materials (does have a supportive wife that will assist him)   PT Frequency 2x / week   PT Duration 8 weeks   PT Treatment/Interventions ADLs/Self Care Home Management;DME Instruction;Gait training;Stair training;Functional mobility training;Therapeutic activities;Therapeutic exercise;Balance training;Neuromuscular re-education;Patient/family education;Manual techniques;Passive range of motion   PT Next Visit Plan Work on posture, LE strengthening, high level balance and gait possibly on compliant/outdoor surfaces,    Consulted and Agree with Plan of Care Patient      Patient will benefit from skilled therapeutic intervention in order to improve the following deficits and impairments:  Abnormal gait, Decreased balance, Decreased knowledge of use of DME, Decreased mobility, Decreased range of motion, Impaired flexibility, Postural dysfunction  Visit Diagnosis: Abnormal posture  Other abnormalities of gait and mobility  Unsteadiness on feet  Other symptoms and signs involving the musculoskeletal system     Problem List Patient Active Problem List   Diagnosis Date Noted  . Prediabetes 05/01/2016  . Pain in both lower extremities 05/01/2016  . Bilateral hearing loss 05/01/2016  . Migraine 04/26/2015  . Aortic valve replaced 04/21/2015  . Subacute confusional state 12/14/2014  . Dyspnea 09/16/2013  . Pulmonary infiltrates 07/31/2013  . Encounter for therapeutic drug monitoring 03/20/2013  . CAD (coronary artery disease) 03/13/2013  . Atrial fibrillation, controlled ([place]) 03/13/2013  . Protein-calorie malnutrition, severe ([place]) 02/27/2013  . S/P CABG x 3 02/20/2013  . History of embolectomy 09/22/2012  . Renal insufficiency,  mild 09/22/2012  . Syncope 09/15/2012  . Peripheral vascular disease, unspecified ([place]) 07/08/2012  .  Mitral stenosis 07/27/2010  . Hyperlipidemia 10/25/2009  . Essential hypertension 10/25/2009  . NEPHROLITHIASIS, HX OF 10/25/2009  . SLEEP DISORDER 11/29/2008  . SNORING 10/14/2008  . APNEA 10/05/2008  . SKIN CANCER, HX OF 10/05/2008  . RESTLESS LEG SYNDROME 09/11/2007  . Allergic rhinitis, cause unspecified 09/11/2007  . GOUT 03/15/2006  . ASTHMA 03/15/2006  . COPD 03/15/2006  . Osteoporosis 03/15/2006  . COLONIC POLYPS, HX OF 03/15/2006    Skip Estimable, SPTA 09/03/2016, 4:51 PM  [facility] [address] [place], [place], [address] Phone: [phone]   Fax:  [phone]  Name: DRESHAWN HENDERSHOTT MRN: [id number] Date of Birth: 1930-07-10

## 2016-09-04 DIAGNOSIS — Z1283 Encounter for screening for malignant neoplasm of skin: Secondary | ICD-10-CM | POA: Diagnosis not present

## 2016-09-04 DIAGNOSIS — C44311 Basal cell carcinoma of skin of nose: Secondary | ICD-10-CM | POA: Diagnosis not present

## 2016-09-04 DIAGNOSIS — Z08 Encounter for follow-up examination after completed treatment for malignant neoplasm: Secondary | ICD-10-CM | POA: Diagnosis not present

## 2016-09-04 DIAGNOSIS — D225 Melanocytic nevi of trunk: Secondary | ICD-10-CM | POA: Diagnosis not present

## 2016-09-04 DIAGNOSIS — Z85820 Personal history of malignant melanoma of skin: Secondary | ICD-10-CM | POA: Diagnosis not present

## 2016-09-04 DIAGNOSIS — C4441 Basal cell carcinoma of skin of scalp and neck: Secondary | ICD-10-CM | POA: Diagnosis not present

## 2016-09-05 ENCOUNTER — Other Ambulatory Visit: Payer: Self-pay | Admitting: Internal Medicine

## 2016-09-07 ENCOUNTER — Ambulatory Visit: Payer: Medicare Other | Admitting: Physical Therapy

## 2016-09-07 DIAGNOSIS — R29898 Other symptoms and signs involving the musculoskeletal system: Secondary | ICD-10-CM | POA: Diagnosis not present

## 2016-09-07 DIAGNOSIS — R293 Abnormal posture: Secondary | ICD-10-CM | POA: Diagnosis not present

## 2016-09-07 DIAGNOSIS — R2681 Unsteadiness on feet: Secondary | ICD-10-CM | POA: Diagnosis not present

## 2016-09-07 DIAGNOSIS — R2689 Other abnormalities of gait and mobility: Secondary | ICD-10-CM | POA: Diagnosis not present

## 2016-09-08 NOTE — Therapy (Signed)
[facility] [address] [place], [place], [address] Phone: [phone]   Fax:  [phone]  Physical Therapy Treatment  Patient Details  Name: Robert Richard MRN: [id number] Date of Birth: 02/05/1931 Referring Provider: Dorris Carnes  Encounter Date: 09/07/2016      PT End of Session - 09/08/16 0957    Visit Number 5   Number of Visits 17   Date for PT Re-Evaluation 10/23/16   Authorization Type [employer]  G-code every 10th visit   Authorization Time Period 08/24/16 to 10/23/16   PT Start Time 0800   PT Stop Time 0840   PT Time Calculation (min) 40 min   Equipment Utilized During Treatment Gait belt   Activity Tolerance Patient tolerated treatment well   Behavior During Therapy [facility] for tasks assessed/performed      Past Medical History:  Diagnosis Date  . Allergic rhinitis   . Anemia   . Arthritis    SHOULDER  . Asthma with bronchitis   . Colon polyp   . COPD (chronic obstructive pulmonary disease) (HCC)    Astmatic bronchitis  . Dysrhythmia 08/2012   NEW ONSET ATRIAL FIBRILATION  . Gout    PMH  . Headache(784.0)   . HLD (hyperlipidemia)   . Nephrolithiasis   . Osteoporosis   . RLS (restless legs syndrome)   . Skin cancer (melanoma) ([place]) 2012   Right neck  . Skin cancer, basal cell    Dr Nevada Crane, [facility]    Past Surgical History:  Procedure Laterality Date  . AORTIC VALVE REPLACEMENT N/A 02/20/2013   Procedure: AORTIC VALVE REPLACEMENT (AVR);  Surgeon: Ivin Poot, MD;  Location: [facility];  Service: Open Heart Surgery;  Laterality: N/A;  . COLONOSCOPY W/ POLYPECTOMY     Dr Deatra Ina  . CORONARY ARTERY BYPASS GRAFT N/A 02/20/2013   Procedure: CORONARY ARTERY BYPASS GRAFTING (CABG);  Surgeon: Ivin Poot, MD;  Location: [facility];  Service: Open Heart Surgery;  Laterality: N/A;  . FINGER SURGERY     for foreign body  . INGUINAL HERNIA REPAIR    . INTRAOPERATIVE TRANSESOPHAGEAL ECHOCARDIOGRAM N/A 02/20/2013    Procedure: INTRAOPERATIVE TRANSESOPHAGEAL ECHOCARDIOGRAM;  Surgeon: Ivin Poot, MD;  Location: [facility];  Service: Open Heart Surgery;  Laterality: N/A;  . LEFT AND RIGHT HEART CATHETERIZATION WITH CORONARY ANGIOGRAM N/A 01/05/2013   Procedure: LEFT AND RIGHT HEART CATHETERIZATION WITH CORONARY ANGIOGRAM;  Surgeon: Blane Ohara, MD;  Location: [facility] CATH LAB;  Service: Cardiovascular;  Laterality: N/A;  . LEG SURGERY  06/2010    Dr.Lawson, for blood clott. Left leg   . NECK SURGERY  12/2010   Melanoma ; [facility]   . NOSE SURGERY     Septal Deviation  . RIGHT HEART CATHETERIZATION N/A 01/23/2013   Procedure: RIGHT HEART CATH;  Surgeon: Jolaine Artist, MD;  Location: [facility] CATH LAB;  Service: Cardiovascular;  Laterality: N/A;  . TEE WITHOUT CARDIOVERSION N/A 01/23/2013   Procedure: TRANSESOPHAGEAL ECHOCARDIOGRAM (TEE);  Surgeon: Jolaine Artist, MD;  Location: [facility] ENDOSCOPY;  Service: Cardiovascular;  Laterality: N/A;  sign heart cath consent w/tee consent    There were no vitals filed for this visit.      Subjective Assessment - 09/07/16 0802    Subjective Had 2 skin cancers removed on Tuesday (nose and neck).    Limitations Walking   How long can you walk comfortably? varies day to day; can walk driveway 3-4 times some days, other days  only once; leg fatigue causes him to sit   Patient Stated Goals Walking better; walk without the cane (has used for 3 yrs)   Currently in Pain? No/denies                         [facility] Adult PT Treatment/Exercise - 09/07/16 0810      Ambulation/Gait   Ambulation/Gait Assistance 5: Supervision   Ambulation/Gait Assistance Details cues for posture and to look forward when walking    Ambulation Distance (Feet) 120 Feet  x2   Assistive device Straight cane;None   Gait Pattern Step-through pattern;Decreased arm swing - left;Decreased step length - right;Decreased step length - left;Decreased dorsiflexion - right;Right flexed  knee in stance;Left flexed knee in stance;Poor foot clearance - right;Poor foot clearance - left;Decreased arm swing - right;Trunk flexed   Ambulation Surface Level     Posture/Postural Control   Posture/Postural Control Postural limitations   Postural Limitations Forward head;Rounded Shoulders;Increased thoracic kyphosis   Posture Comments pt required vc for scapular retraction and upright posture with all activities     Lumbar Exercises: Stretches   Active Hamstring Stretch 2 reps;20 seconds   Active Hamstring Stretch Limitations seated, foot on stool with strap   Lower Trunk Rotation 2 reps;20 seconds   Lower Trunk Rotation Limitations more limited rotating knees to his right     Knee/Hip Exercises: Stretches   Hip Flexor Stretch Both;2 reps;30 seconds   Hip Flexor Stretch Limitations supine, knees to chest and lower one leg   Gastroc Stretch Both   Gastroc Stretch Limitations standing     Knee/Hip Exercises: Aerobic   Nustep L3, 4 min             Balance Exercises - 09/07/16 0845      Balance Exercises: Standing   Standing Eyes Opened Wide (BOA);Head turns;Foam/compliant surface   Standing Eyes Closed Wide (BOA);Foam/compliant surface  flat and on ramp    SLS Eyes open;Foam/compliant surface;Upper extremity support 1;Intermittent upper extremity support;3 reps  up to 30 sec   Stepping Strategy Anterior;Posterior;Foam/compliant surface;10 reps  red mat   Retro Gait 3 reps  red mat   Sidestepping --  6 lengths red mat   Marching Limitations 4 lengths red mat   Heel Raises Limitations red mat no UE support   Toe Raise Limitations red mat with UE support on counter             PT Short Term Goals - 08/28/16 1418      PT SHORT TERM GOAL #1   Title Patient/spouse will demonstrate independence in HEP for balance, ROM, and strengthening. (Target date for all STGs 09/21/16)   Time 4   Period Weeks   Status New     PT SHORT TERM GOAL #2   Title Patient will  demonstrate improved LE strength and balance with improved 5 times sit to stand time to <17 seconds.   Time 4   Period Weeks   Status New     PT SHORT TERM GOAL #3   Title Patient will improve gait velocity to >= 2.62 ft/sec (safe community ambulator velocity) while demonstrating appropriate use of assistive device.   Time 4   Period Weeks   Status New     PT SHORT TERM GOAL #4   Title Patient will improve FGA score by 3 points from his baseline to indicate a lesser risk of falling.    Baseline 08/28/16 scored 16/30  Time 4   Period Weeks   Status New     PT SHORT TERM GOAL #5   Title Patient/spouse will complete education on fall prevention.    Time 4   Period Weeks   Status New           PT Long Term Goals - 08/25/16 0836      PT LONG TERM GOAL #1   Title Patient/spouse will demonstrate independence in updated/final HEP for balance, ROM, and strengthening. (Target date for all LTGs 10/23/16)   Time 8   Period Weeks   Status New     PT LONG TERM GOAL #2   Title Patient will improve TUG normal time to <13.5 seconds, indicative of a lesser risk of falling.    Time 8   Period Weeks   Status New     PT LONG TERM GOAL #3   Title Patient will improve gait velocity to >=2.86 ft/sec (10% less than normal speed for healthy 80-89 yr olds, 3.18 ft/sec).   Time 8   Period Weeks   Status New     PT LONG TERM GOAL #4   Title Patient will improve his FGA score by 2 points compared to score at 4 weeks, indicative of a lesser fall risk.   Time 8   Period Weeks   Status New               Plan - 09/08/16 7253    Clinical Impression Statement Session focused on flexibility (to improve balance), balance and gait training. Patient progressing well towards goals and will continue to benefit from PT.   Clinical Impairments Affecting Rehab Potential decreased ability to understand written materials (does have a supportive wife that will assist him)   PT Frequency 2x / week    PT Duration 8 weeks   PT Treatment/Interventions ADLs/Self Care Home Management;DME Instruction;Gait training;Stair training;Functional mobility training;Therapeutic activities;Therapeutic exercise;Balance training;Neuromuscular re-education;Patient/family education;Manual techniques;Passive range of motion   PT Next Visit Plan Give fall prevention info; Work on posture, LE strengthening, high level balance and gait possibly on compliant/outdoor surfaces,    Consulted and Agree with Plan of Care Patient      Patient will benefit from skilled therapeutic intervention in order to improve the following deficits and impairments:  Abnormal gait, Decreased balance, Decreased knowledge of use of DME, Decreased mobility, Decreased range of motion, Impaired flexibility, Postural dysfunction  Visit Diagnosis: Abnormal posture  Other abnormalities of gait and mobility  Unsteadiness on feet     Problem List Patient Active Problem List   Diagnosis Date Noted  . Prediabetes 05/01/2016  . Pain in both lower extremities 05/01/2016  . Bilateral hearing loss 05/01/2016  . Migraine 04/26/2015  . Aortic valve replaced 04/21/2015  . Subacute confusional state 12/14/2014  . Dyspnea 09/16/2013  . Pulmonary infiltrates 07/31/2013  . Encounter for therapeutic drug monitoring 03/20/2013  . CAD (coronary artery disease) 03/13/2013  . Atrial fibrillation, controlled ([place]) 03/13/2013  . Protein-calorie malnutrition, severe ([place]) 02/27/2013  . S/P CABG x 3 02/20/2013  . History of embolectomy 09/22/2012  . Renal insufficiency, mild 09/22/2012  . Syncope 09/15/2012  . Peripheral vascular disease, unspecified ([place]) 07/08/2012  . Mitral stenosis 07/27/2010  . Hyperlipidemia 10/25/2009  . Essential hypertension 10/25/2009  . NEPHROLITHIASIS, HX OF 10/25/2009  . SLEEP DISORDER 11/29/2008  . SNORING 10/14/2008  . APNEA 10/05/2008  . SKIN CANCER, HX OF 10/05/2008  . RESTLESS LEG SYNDROME 09/11/2007  .  Allergic rhinitis, cause  unspecified 09/11/2007  . GOUT 03/15/2006  . ASTHMA 03/15/2006  . COPD 03/15/2006  . Osteoporosis 03/15/2006  . COLONIC POLYPS, HX OF 03/15/2006    Jeanie Cooks Angelly Spearing. PT 09/08/2016, 10:01 AM  [facility] [address] [place], [place], [address] Phone: [phone]   Fax:  [phone]  Name: Robert Richard MRN: [id number] Date of Birth: Jan 17, 1931

## 2016-09-10 ENCOUNTER — Ambulatory Visit: Payer: Medicare Other | Admitting: Physical Therapy

## 2016-09-12 ENCOUNTER — Encounter: Payer: Self-pay | Admitting: Family Medicine

## 2016-09-12 ENCOUNTER — Ambulatory Visit (INDEPENDENT_AMBULATORY_CARE_PROVIDER_SITE_OTHER): Payer: Medicare Other | Admitting: Family Medicine

## 2016-09-12 ENCOUNTER — Other Ambulatory Visit (INDEPENDENT_AMBULATORY_CARE_PROVIDER_SITE_OTHER): Payer: Medicare Other

## 2016-09-12 VITALS — BP 120/64 | HR 61 | Temp 98.7°F | Ht 73.0 in | Wt 141.0 lb

## 2016-09-12 DIAGNOSIS — R05 Cough: Secondary | ICD-10-CM | POA: Diagnosis not present

## 2016-09-12 DIAGNOSIS — R350 Frequency of micturition: Secondary | ICD-10-CM | POA: Diagnosis not present

## 2016-09-12 DIAGNOSIS — R059 Cough, unspecified: Secondary | ICD-10-CM | POA: Insufficient documentation

## 2016-09-12 LAB — URINALYSIS, ROUTINE W REFLEX MICROSCOPIC
Bilirubin Urine: NEGATIVE
Ketones, ur: NEGATIVE
Nitrite: NEGATIVE
Specific Gravity, Urine: 1.025 (ref 1.000–1.030)
Urine Glucose: NEGATIVE
Urobilinogen, UA: 0.2 (ref 0.0–1.0)
pH: 6 (ref 5.0–8.0)

## 2016-09-12 MED ORDER — AMOXICILLIN-POT CLAVULANATE 875-125 MG PO TABS: 1.0000 | ORAL_TABLET | Freq: Two times a day (BID) | ORAL | 0 refills | Status: DC

## 2016-09-12 NOTE — Patient Instructions (Signed)
Thank you for coming in,   I have sent an antibiotic into the pharmacy.  Please of a no if your symptoms with the urination do not improve or worsen. I will call or send a letter with the results from today.    Please feel free to call with any questions or concerns at any time. --Dr. Raeford Razor

## 2016-09-12 NOTE — Assessment & Plan Note (Addendum)
Possible for a bacterial sinusitis versus underlying pneumonia. - Augmentin and sent in - Given indications to return

## 2016-09-12 NOTE — Progress Notes (Signed)
Robert Richard - 83 y.o. male MRN [id number]  Date of birth: [birth year]  SUBJECTIVE:  Including CC & ROS.  Chief Complaint  Patient presents with  . Chest congestion    X2 weeks coughing up yellow phlegm, taking mucinex states helopin little low grade fever since saturday around 99.5  . Urinary Frequency    been going on several months getting worse in the last couple of weeks where patient cannot make it to the bathroom     Robert Richard is a 83 yo M that presents congestion that has been present for two weeks. Tried mucinex. Uses a netti pot but no inhalers. Had Robert increased temperature this morning. His mucous production is yellow in nature. He denies any chest pain. He denies any nasal congestion.  Urinary Frequency Isn't able to make it to the bathroom before he has Robert accident  Has been occurring for about 2-3 months.  No pain with urination.  History of kidney stones  He is unclear if his coughing is associated with his urinary accidents. He has not felt there is any association with his medication use and his urinary frequency. He does receive a prolio injection in December.  Review of Systems Cough, mucous production, and no chest pain, shortness of breath otherwise negative.    HISTORY: Past Medical, Surgical, Social, and Family History Reviewed & Updated per EMR.   Pertinent Historical Findings include:  Past Medical History:  Diagnosis Date  . Allergic rhinitis   . Anemia   . Arthritis    SHOULDER  . Asthma with bronchitis   . Colon polyp   . COPD (chronic obstructive pulmonary disease) (HCC)    Astmatic bronchitis  . Dysrhythmia 08/2012   NEW ONSET ATRIAL FIBRILATION  . Gout    PMH  . Headache(784.0)   . HLD (hyperlipidemia)   . Nephrolithiasis   . Osteoporosis   . RLS (restless legs syndrome)   . Skin cancer (melanoma) ([place]) 2012   Right neck  . Skin cancer, basal cell    Dr Nevada Crane, [facility]    Allergies  Allergen Reactions  . Ivp Dye [Iodinated [employer]] Hives    Other reaction(s): RASH    Family History  Problem Relation Age of Onset  . Allergies Mother   . Coronary artery disease Mother   . Allergy (severe) Mother   . Allergies Father   . Stroke Father   . Heart attack Father 26  . Asthma Father   . Allergy (severe) Father   . Allergies Sister   . Allergy (severe) Sister   . Allergies Brother        x2  . Allergy (severe) Brother   . Diabetes Brother   . Allergy (severe) Brother   . Emphysema Brother        smoked  . Cancer Son      Social History   Social History  . Marital status: Married    Spouse name: N/A  . Number of children: N/A  . Years of education: N/A   Occupational History  . retired Astronomer [employer]   Social History Main Topics  . Smoking status: Former Smoker    Packs/day: 3.00    Years: 15.00    Types: Cigarettes    Quit date: 02/19/1961  . Smokeless tobacco: Never Used  . Alcohol use No  . Drug use: No  . Sexual activity: Not on file   Other Topics Concern  . Not on file   Social  History Narrative  . No narrative on file     PHYSICAL EXAM:  VS: BP 120/64 (BP Location: Left Arm, Patient Position: Sitting, Cuff Size: Normal)   Pulse 61   Temp 98.7 F (37.1 C) (Oral)   Ht 6\' 1"  (1.854 m)   Wt 141 lb (64 kg)   SpO2 99%   BMI 18.60 kg/m  PHYSICAL EXAM: Gen: NAD, alert, cooperative with exam, Hard of hearing HEENT: NCAT, EOMI, clear conjunctiva, oropharynx clear, supple neck, tympanic membrane's clear and intact bilaterally CV: Irregularly irregular, good S1/S2, no murmur, no edema, capillary refill brisk  Resp: CTABL, no wheezes, non-labored Abd: SNTND, BS present, no guarding or organomegaly Skin: no rashes, normal turgor  Neuro: no gross deficits.  Psych: alert and oriented  ASSESSMENT & PLAN:   Urinary frequency Unclear if his frequency associated with increased coughing. Reviewed his lab work today. He does have a hemoglobin A1c of 6.3 so may be associated with  this. Unclear if this is related to Robert overactive bladder or BPH. - Advised to follow along when this occurs to better idea what is associated with - Urinalysis today. - Could consider urology referral future if no improvement.  Cough Possible for a bacterial sinusitis versus underlying pneumonia. - Augmentin and sent in - Given indications to return

## 2016-09-12 NOTE — Assessment & Plan Note (Addendum)
Unclear if his frequency associated with increased coughing. Reviewed his lab work today. He does have a hemoglobin A1c of 6.3 so may be associated with this. Unclear if this is related to an overactive bladder or BPH. - Advised to follow along when this occurs to better idea what is associated with - Urinalysis today. - Could consider urology referral future if no improvement.

## 2016-09-13 ENCOUNTER — Ambulatory Visit (INDEPENDENT_AMBULATORY_CARE_PROVIDER_SITE_OTHER): Payer: Medicare Other | Admitting: *Deleted

## 2016-09-13 ENCOUNTER — Ambulatory Visit: Payer: Medicare Other | Admitting: Physical Therapy

## 2016-09-13 DIAGNOSIS — Z5181 Encounter for therapeutic drug level monitoring: Secondary | ICD-10-CM

## 2016-09-13 DIAGNOSIS — I4891 Unspecified atrial fibrillation: Secondary | ICD-10-CM | POA: Diagnosis not present

## 2016-09-13 DIAGNOSIS — Z952 Presence of prosthetic heart valve: Secondary | ICD-10-CM | POA: Diagnosis not present

## 2016-09-13 LAB — POCT INR: INR: 1.3

## 2016-09-17 ENCOUNTER — Ambulatory Visit: Payer: Medicare Other | Admitting: Physical Therapy

## 2016-09-20 ENCOUNTER — Ambulatory Visit (INDEPENDENT_AMBULATORY_CARE_PROVIDER_SITE_OTHER): Payer: Medicare Other | Admitting: Pharmacist

## 2016-09-20 ENCOUNTER — Ambulatory Visit: Payer: Medicare Other | Admitting: Physical Therapy

## 2016-09-20 ENCOUNTER — Other Ambulatory Visit: Payer: Self-pay | Admitting: Internal Medicine

## 2016-09-20 DIAGNOSIS — Z5181 Encounter for therapeutic drug level monitoring: Secondary | ICD-10-CM

## 2016-09-20 DIAGNOSIS — Z952 Presence of prosthetic heart valve: Secondary | ICD-10-CM | POA: Diagnosis not present

## 2016-09-20 DIAGNOSIS — I4891 Unspecified atrial fibrillation: Secondary | ICD-10-CM | POA: Diagnosis not present

## 2016-09-20 LAB — POCT INR: INR: 2.3

## 2016-09-25 ENCOUNTER — Ambulatory Visit: Payer: Medicare Other | Attending: Internal Medicine | Admitting: Physical Therapy

## 2016-09-25 DIAGNOSIS — R2681 Unsteadiness on feet: Secondary | ICD-10-CM | POA: Insufficient documentation

## 2016-09-25 DIAGNOSIS — R293 Abnormal posture: Secondary | ICD-10-CM | POA: Insufficient documentation

## 2016-09-25 DIAGNOSIS — R29898 Other symptoms and signs involving the musculoskeletal system: Secondary | ICD-10-CM | POA: Insufficient documentation

## 2016-09-25 DIAGNOSIS — R2689 Other abnormalities of gait and mobility: Secondary | ICD-10-CM | POA: Insufficient documentation

## 2016-09-27 ENCOUNTER — Ambulatory Visit: Payer: Medicare Other | Admitting: Physical Therapy

## 2016-10-02 ENCOUNTER — Encounter: Payer: Self-pay | Admitting: Physical Therapy

## 2016-10-02 ENCOUNTER — Ambulatory Visit: Payer: Medicare Other | Admitting: Physical Therapy

## 2016-10-02 DIAGNOSIS — R293 Abnormal posture: Secondary | ICD-10-CM | POA: Diagnosis not present

## 2016-10-02 DIAGNOSIS — Z85828 Personal history of other malignant neoplasm of skin: Secondary | ICD-10-CM | POA: Diagnosis not present

## 2016-10-02 DIAGNOSIS — R29898 Other symptoms and signs involving the musculoskeletal system: Secondary | ICD-10-CM | POA: Diagnosis not present

## 2016-10-02 DIAGNOSIS — R2681 Unsteadiness on feet: Secondary | ICD-10-CM

## 2016-10-02 DIAGNOSIS — R2689 Other abnormalities of gait and mobility: Secondary | ICD-10-CM

## 2016-10-02 DIAGNOSIS — Z08 Encounter for follow-up examination after completed treatment for malignant neoplasm: Secondary | ICD-10-CM | POA: Diagnosis not present

## 2016-10-02 DIAGNOSIS — C44219 Basal cell carcinoma of skin of left ear and external auricular canal: Secondary | ICD-10-CM | POA: Diagnosis not present

## 2016-10-02 DIAGNOSIS — C44519 Basal cell carcinoma of skin of other part of trunk: Secondary | ICD-10-CM | POA: Diagnosis not present

## 2016-10-02 DIAGNOSIS — C4441 Basal cell carcinoma of skin of scalp and neck: Secondary | ICD-10-CM | POA: Diagnosis not present

## 2016-10-02 NOTE — Therapy (Signed)
[facility] [address] [place], [place], [address] Phone: [phone]   Fax:  [phone]  Physical Therapy Treatment  Patient Details  Name: Robert Richard MRN: [id number] Date of Birth: Jun 27, 1930 Referring Provider: Dorris Carnes  Encounter Date: 10/02/2016      PT End of Session - 10/02/16 1918    Visit Number 6   Number of Visits 17   Date for PT Re-Evaluation 10/23/16   Authorization Type [employer]  G-code every 10th visit   Authorization Time Period 08/24/16 to 10/23/16   PT Start Time 0936   PT Stop Time 1017   PT Time Calculation (min) 41 min   Equipment Utilized During Treatment Gait belt   Activity Tolerance Patient tolerated treatment well   Behavior During Therapy [facility] for tasks assessed/performed      Past Medical History:  Diagnosis Date  . Allergic rhinitis   . Anemia   . Arthritis    SHOULDER  . Asthma with bronchitis   . Colon polyp   . COPD (chronic obstructive pulmonary disease) (HCC)    Astmatic bronchitis  . Dysrhythmia 08/2012   NEW ONSET ATRIAL FIBRILATION  . Gout    PMH  . Headache(784.0)   . HLD (hyperlipidemia)   . Nephrolithiasis   . Osteoporosis   . RLS (restless legs syndrome)   . Skin cancer (melanoma) ([place]) 2012   Right neck  . Skin cancer, basal cell    Dr Nevada Crane, [facility]    Past Surgical History:  Procedure Laterality Date  . AORTIC VALVE REPLACEMENT N/A 02/20/2013   Procedure: AORTIC VALVE REPLACEMENT (AVR);  Surgeon: Ivin Poot, MD;  Location: [facility];  Service: Open Heart Surgery;  Laterality: N/A;  . COLONOSCOPY W/ POLYPECTOMY     Dr Deatra Ina  . CORONARY ARTERY BYPASS GRAFT N/A 02/20/2013   Procedure: CORONARY ARTERY BYPASS GRAFTING (CABG);  Surgeon: Ivin Poot, MD;  Location: [facility];  Service: Open Heart Surgery;  Laterality: N/A;  . FINGER SURGERY     for foreign body  . INGUINAL HERNIA REPAIR    . INTRAOPERATIVE TRANSESOPHAGEAL ECHOCARDIOGRAM N/A 02/20/2013    Procedure: INTRAOPERATIVE TRANSESOPHAGEAL ECHOCARDIOGRAM;  Surgeon: Ivin Poot, MD;  Location: [facility];  Service: Open Heart Surgery;  Laterality: N/A;  . LEFT AND RIGHT HEART CATHETERIZATION WITH CORONARY ANGIOGRAM N/A 01/05/2013   Procedure: LEFT AND RIGHT HEART CATHETERIZATION WITH CORONARY ANGIOGRAM;  Surgeon: Blane Ohara, MD;  Location: [facility] CATH LAB;  Service: Cardiovascular;  Laterality: N/A;  . LEG SURGERY  06/2010    Dr.Lawson, for blood clott. Left leg   . NECK SURGERY  12/2010   Melanoma ; [facility]   . NOSE SURGERY     Septal Deviation  . RIGHT HEART CATHETERIZATION N/A 01/23/2013   Procedure: RIGHT HEART CATH;  Surgeon: Jolaine Artist, MD;  Location: [facility] CATH LAB;  Service: Cardiovascular;  Laterality: N/A;  . TEE WITHOUT CARDIOVERSION N/A 01/23/2013   Procedure: TRANSESOPHAGEAL ECHOCARDIOGRAM (TEE);  Surgeon: Jolaine Artist, MD;  Location: [facility] ENDOSCOPY;  Service: Cardiovascular;  Laterality: N/A;  sign heart cath consent w/tee consent    There were no vitals filed for this visit.      Subjective Assessment - 10/02/16 0939    Subjective States balance and strength have gotten worse while sick with upper respiratory illness (he and wife have had).    Limitations Walking   How long can you walk comfortably? varies day to day; can  walk driveway 3-4 times some days, other days only once; leg fatigue causes him to sit   Patient Stated Goals Walking better; walk without the cane (has used for 3 yrs)   Currently in Pain? No/denies            [facility] PT Assessment - 10/02/16 0001      Ambulation/Gait   Gait velocity 32.8/12.31=2.66 ft/sec     Functional Gait  Assessment   Gait Level Surface Walks 20 ft, slow speed, abnormal gait pattern, evidence for imbalance or deviates 10-15 in outside of the 12 in walkway width. Requires more than 7 sec to ambulate 20 ft.   Change in Gait Speed Cannot change speeds, deviates greater than 15 in outside 12 in walkway  width, or loses balance and has to reach for wall or be caught.   Gait with Horizontal Head Turns Performs head turns smoothly with slight change in gait velocity (eg, minor disruption to smooth gait path), deviates 6-10 in outside 12 in walkway width, or uses an assistive device.   Gait with Vertical Head Turns Performs task with slight change in gait velocity (eg, minor disruption to smooth gait path), deviates 6 - 10 in outside 12 in walkway width or uses assistive device   Gait and Pivot Turn Pivot turns safely in greater than 3 sec and stops with no loss of balance, or pivot turns safely within 3 sec and stops with mild imbalance, requires small steps to catch balance.   Step Over Obstacle Is able to step over one shoe box (4.5 in total height) but must slow down and adjust steps to clear box safely. May require verbal cueing.   Gait with Narrow Base of Support Ambulates less than 4 steps heel to toe or cannot perform without assistance.   Gait with Eyes Closed Walks 20 ft, slow speed, abnormal gait pattern, evidence for imbalance, deviates 10-15 in outside 12 in walkway width. Requires more than 9 sec to ambulate 20 ft.   Ambulating Backwards Walks 20 ft, slow speed, abnormal gait pattern, evidence for imbalance, deviates 10-15 in outside 12 in walkway width.   Steps Alternating feet, must use rail.   Total Score 12                     [facility] Adult PT Treatment/Exercise - 10/02/16 0001      Transfers   Transfers Sit to Stand;Stand to Sit   Sit to Stand 6: Modified independent (Device/Increase time)   Five time sit to stand comments  17.50  with UE's from mat table   Stand to Sit 6: Modified independent (Device/Increase time)     Ambulation/Gait   Ambulation/Gait Assistance 5: Supervision   Ambulation/Gait Assistance Details cues for posture, bil heelstrike to incr foot clearance, proper use of SPC   Ambulation Distance (Feet) 120 Feet  50, 40, 120   Assistive device  Straight cane   Gait Pattern Step-through pattern;Decreased arm swing - left;Decreased step length - right;Decreased step length - left;Decreased dorsiflexion - right;Right flexed knee in stance;Left flexed knee in stance;Poor foot clearance - right;Poor foot clearance - left;Decreased arm swing - right;Trunk flexed   Ambulation Surface Level     Standardized Balance Assessment   Standardized Balance Assessment Timed Up and Go Test     Timed Up and Go Test   TUG Normal TUG   Normal TUG (seconds) 17.5     Knee/Hip Exercises: Standing   Hip Flexion Stengthening;Both;1 set;10 reps  Hip Abduction Stengthening;Both;10 reps;1 set  second set with green theraband   Abduction Limitations cues to keep upright posture    Hip Extension Stengthening;Both;10 reps;1 set  second set was with a green theraband              Balance Exercises - 10/02/16 1007      Balance Exercises: Standing   SLS Eyes open;Upper extremity support 1;3 reps;10 secs  counter vs cane     OTAGO PROGRAM   Ankle Plantorflexors 20 reps, support  counter vs cane in single hand   Backwards Walking Support  single UE on // bars x 6    Heel Walking Support   Toe Walk Support           PT Education - 10/02/16 1910    Education provided Yes   Education Details Need to resume HEP he was doing prior to recent illness; results of assessing STGs   Person(s) Educated Patient   Methods Explanation   Comprehension Verbalized understanding          PT Short Term Goals - 10/02/16 1911      PT SHORT TERM GOAL #1   Title Patient/spouse will demonstrate independence in HEP for balance, ROM, and strengthening. (Target date for all STGs 09/21/16)   Time 4   Period Weeks   Status Achieved     PT SHORT TERM GOAL #2   Title Patient will demonstrate improved LE strength and balance with improved 5 times sit to stand time to <17 seconds.   Baseline 10/02/16  17.5 seconds   Time 4   Period Weeks   Status Partially  Met     PT SHORT TERM GOAL #3   Title Patient will improve gait velocity to >= 2.62 ft/sec (safe community ambulator velocity) while demonstrating appropriate use of assistive device.   Baseline 10/02/16 2.66 ft/sec   Time 4   Period Weeks   Status Achieved     PT SHORT TERM GOAL #4   Title Patient will improve FGA score by 3 points from his baseline to indicate a lesser risk of falling.    Baseline 08/28/16 scored 16/30 ; 10/02/16  12/30 (returned after missing several weeks of PT due to illness)   Time 4   Period Weeks   Status Not Met     PT SHORT TERM GOAL #5   Title Patient/spouse will complete education on fall prevention.    Time 4   Period Weeks   Status On-going           PT Long Term Goals - 08/25/16 0836      PT LONG TERM GOAL #1   Title Patient/spouse will demonstrate independence in updated/final HEP for balance, ROM, and strengthening. (Target date for all LTGs 10/23/16)   Time 8   Period Weeks   Status New     PT LONG TERM GOAL #2   Title Patient will improve TUG normal time to <13.5 seconds, indicative of a lesser risk of falling.    Time 8   Period Weeks   Status New     PT LONG TERM GOAL #3   Title Patient will improve gait velocity to >=2.86 ft/sec (10% less than normal speed for healthy 80-89 yr olds, 3.18 ft/sec).   Time 8   Period Weeks   Status New     PT LONG TERM GOAL #4   Title Patient will improve his FGA score by 2 points compared to score at  4 weeks, indicative of a lesser fall risk.   Time 8   Period Weeks   Status New               Plan - 10/02/16 1925    Clinical Impression Statement Patient returned to PT after missing 3 weeks of PT due to respiratory illness. STGs assessed this date to establish his current status compared to on evaluation 08/24/16. Patient met only 2 of 5 goals, partially met one goal, with 2 other goals ongoing or not met. Patient's TUG score    Clinical Impairments Affecting Rehab Potential decreased  ability to understand written materials (does have a supportive wife that will assist him)   PT Frequency 2x / week   PT Duration 8 weeks   PT Treatment/Interventions ADLs/Self Care Home Management;DME Instruction;Gait training;Stair training;Functional mobility training;Therapeutic activities;Therapeutic exercise;Balance training;Neuromuscular re-education;Patient/family education;Manual techniques;Passive range of motion   PT Next Visit Plan Work on posture, LE strengthening, high level balance and gait possibly on compliant/outdoor surfaces,    Consulted and Agree with Plan of Care Patient      Patient will benefit from skilled therapeutic intervention in order to improve the following deficits and impairments:  Abnormal gait, Decreased balance, Decreased knowledge of use of DME, Decreased mobility, Decreased range of motion, Impaired flexibility, Postural dysfunction  Visit Diagnosis: Other abnormalities of gait and mobility  Unsteadiness on feet     Problem List Patient Active Problem List   Diagnosis Date Noted  . Urinary frequency 09/12/2016  . Cough 09/12/2016  . Prediabetes 05/01/2016  . Pain in both lower extremities 05/01/2016  . Bilateral hearing loss 05/01/2016  . Migraine 04/26/2015  . Aortic valve replaced 04/21/2015  . Subacute confusional state 12/14/2014  . Dyspnea 09/16/2013  . Pulmonary infiltrates 07/31/2013  . Encounter for therapeutic drug monitoring 03/20/2013  . CAD (coronary artery disease) 03/13/2013  . Atrial fibrillation, controlled ([place]) 03/13/2013  . Protein-calorie malnutrition, severe ([place]) 02/27/2013  . S/P CABG x 3 02/20/2013  . History of embolectomy 09/22/2012  . Renal insufficiency, mild 09/22/2012  . Syncope 09/15/2012  . Peripheral vascular disease, unspecified ([place]) 07/08/2012  . Mitral stenosis 07/27/2010  . Hyperlipidemia 10/25/2009  . Essential hypertension 10/25/2009  . NEPHROLITHIASIS, HX OF 10/25/2009  . SLEEP DISORDER  11/29/2008  . SNORING 10/14/2008  . APNEA 10/05/2008  . SKIN CANCER, HX OF 10/05/2008  . RESTLESS LEG SYNDROME 09/11/2007  . Allergic rhinitis, cause unspecified 09/11/2007  . GOUT 03/15/2006  . ASTHMA 03/15/2006  . COPD 03/15/2006  . Osteoporosis 03/15/2006  . COLONIC POLYPS, HX OF 03/15/2006    Rexanne Mano, PT 10/02/2016, 7:31 PM  [facility] [address] [place], [place], [address] Phone: [phone]   Fax:  [phone]  Name: WANDELL SCULLION MRN: [id number] Date of Birth: 1931-01-12

## 2016-10-04 ENCOUNTER — Ambulatory Visit: Payer: Medicare Other | Admitting: Physical Therapy

## 2016-10-04 ENCOUNTER — Encounter: Payer: Self-pay | Admitting: Physical Therapy

## 2016-10-04 DIAGNOSIS — R2681 Unsteadiness on feet: Secondary | ICD-10-CM

## 2016-10-04 DIAGNOSIS — R293 Abnormal posture: Secondary | ICD-10-CM | POA: Diagnosis not present

## 2016-10-04 DIAGNOSIS — R2689 Other abnormalities of gait and mobility: Secondary | ICD-10-CM

## 2016-10-04 DIAGNOSIS — R29898 Other symptoms and signs involving the musculoskeletal system: Secondary | ICD-10-CM | POA: Diagnosis not present

## 2016-10-04 NOTE — Patient Instructions (Signed)
Suboccipital Stretch (Supine)    With small pillow or folded towel at base of skull and upper neck. Gently tuck chin until stretch is felt at base of skull and upper neck. Hold __5__ seconds. Relax. Repeat __10__ times per set. Do __1__ sets per session. Do __1__ sessions per day.  http://orth.exer.us/984   Copyright  VHI. All rights reserved.

## 2016-10-04 NOTE — Therapy (Signed)
[facility] [address] [place], [place], [address] Phone: [phone]   Fax:  [phone]  Physical Therapy Treatment  Patient Details  Name: Robert Richard MRN: [id number] Date of Birth: 1930/04/15 Referring Provider: Dorris Carnes  Encounter Date: 10/04/2016      PT End of Session - 10/04/16 1704    Visit Number 7   Number of Visits 17   Date for PT Re-Evaluation 10/23/16   Authorization Type U[employer] Medicare  G-code every 10th visit   Authorization Time Period 08/24/16 to 10/23/16   PT Start Time 1016   PT Stop Time 1058   PT Time Calculation (min) 42 min   Equipment Utilized During Treatment Gait belt   Activity Tolerance Patient tolerated treatment well   Behavior During Therapy [facility] for tasks assessed/performed      Past Medical History:  Diagnosis Date  . Allergic rhinitis   . Anemia   . Arthritis    SHOULDER  . Asthma with bronchitis   . Colon polyp   . COPD (chronic obstructive pulmonary disease) (HCC)    Astmatic bronchitis  . Dysrhythmia 08/2012   NEW ONSET ATRIAL FIBRILATION  . Gout    PMH  . Headache(784.0)   . HLD (hyperlipidemia)   . Nephrolithiasis   . Osteoporosis   . RLS (restless legs syndrome)   . Skin cancer (melanoma) ([place]) 2012   Right neck  . Skin cancer, basal cell    Dr Nevada Crane, [facility]    Past Surgical History:  Procedure Laterality Date  . AORTIC VALVE REPLACEMENT N/A 02/20/2013   Procedure: AORTIC VALVE REPLACEMENT (AVR);  Surgeon: Ivin Poot, MD;  Location: [place];  Service: Open Heart Surgery;  Laterality: N/A;  . COLONOSCOPY W/ POLYPECTOMY     Dr Deatra Ina  . CORONARY ARTERY BYPASS GRAFT N/A 02/20/2013   Procedure: CORONARY ARTERY BYPASS GRAFTING (CABG);  Surgeon: Ivin Poot, MD;  Location: [place];  Service: Open Heart Surgery;  Laterality: N/A;  . FINGER SURGERY     for foreign body  . INGUINAL HERNIA REPAIR    . INTRAOPERATIVE TRANSESOPHAGEAL ECHOCARDIOGRAM N/A 02/20/2013    Procedure: INTRAOPERATIVE TRANSESOPHAGEAL ECHOCARDIOGRAM;  Surgeon: Ivin Poot, MD;  Location: [place];  Service: Open Heart Surgery;  Laterality: N/A;  . LEFT AND RIGHT HEART CATHETERIZATION WITH CORONARY ANGIOGRAM N/A 01/05/2013   Procedure: LEFT AND RIGHT HEART CATHETERIZATION WITH CORONARY ANGIOGRAM;  Surgeon: Blane Ohara, MD;  Location: [facility] CATH LAB;  Service: Cardiovascular;  Laterality: N/A;  . LEG SURGERY  06/2010    Dr.Lawson, for blood clott. Left leg   . NECK SURGERY  12/2010   Melanoma ; [facility]   . NOSE SURGERY     Septal Deviation  . RIGHT HEART CATHETERIZATION N/A 01/23/2013   Procedure: RIGHT HEART CATH;  Surgeon: Jolaine Artist, MD;  Location: [facility] CATH LAB;  Service: Cardiovascular;  Laterality: N/A;  . TEE WITHOUT CARDIOVERSION N/A 01/23/2013   Procedure: TRANSESOPHAGEAL ECHOCARDIOGRAM (TEE);  Surgeon: Jolaine Artist, MD;  Location: [facility] ENDOSCOPY;  Service: Cardiovascular;  Laterality: N/A;  sign heart cath consent w/tee consent    There were no vitals filed for this visit.      Subjective Assessment - 10/04/16 1020    Subjective I had company yesterday and didn't have time to do my exercises. I had it all out and ready to do them, but it didn't work out for me.    Limitations Walking  How long can you walk comfortably? varies day to day; can walk driveway 3-4 times some days, other days only once; leg fatigue causes him to sit   Patient Stated Goals Walking better; walk without the cane (has used for 3 yrs)   Currently in Pain? No/denies            [facility] PT Assessment - 10/04/16 1029      Functional Gait  Assessment   Gait Level Surface Walks 20 ft, slow speed, abnormal gait pattern, evidence for imbalance or deviates 10-15 in outside of the 12 in walkway width. Requires more than 7 sec to ambulate 20 ft.   Change in Gait Speed Makes only minor adjustments to walking speed, or accomplishes a change in speed with significant gait deviations,  deviates 10-15 in outside the 12 in walkway width, or changes speed but loses balance but is able to recover and continue walking.   Gait with Horizontal Head Turns Performs head turns smoothly with slight change in gait velocity (eg, minor disruption to smooth gait path), deviates 6-10 in outside 12 in walkway width, or uses an assistive device.   Gait with Vertical Head Turns Performs task with moderate change in gait velocity, slows down, deviates 10-15 in outside 12 in walkway width but recovers, can continue to walk.   Gait and Pivot Turn Pivot turns safely in greater than 3 sec and stops with no loss of balance, or pivot turns safely within 3 sec and stops with mild imbalance, requires small steps to catch balance.   Step Over Obstacle Is able to step over 2 stacked shoe boxes taped together (9 in total height) without changing gait speed. No evidence of imbalance.   Gait with Narrow Base of Support Ambulates less than 4 steps heel to toe or cannot perform without assistance.   Gait with Eyes Closed Walks 20 ft, slow speed, abnormal gait pattern, evidence for imbalance, deviates 10-15 in outside 12 in walkway width. Requires more than 9 sec to ambulate 20 ft.   Ambulating Backwards Walks 20 ft, uses assistive device, slower speed, mild gait deviations, deviates 6-10 in outside 12 in walkway width.   Steps Alternating feet, no rail.   Total Score 16                     [facility] Adult PT Treatment/Exercise - 10/04/16 1100      Bed Mobility   Bed Mobility Rolling Right;Rolling Left;Left Sidelying to Sit;Sit to Supine   Rolling Right 6: Modified independent (Device/Increase time)   Rolling Left 6: Modified independent (Device/Increase time)   Left Sidelying to Sit 6: Modified independent (Device/Increase time)   Sit to Supine 6: Modified independent (Device/Increase time)     Transfers   Transfers Sit to Stand   Sit to Stand 6: Modified independent (Device/Increase time)   Stand  to Sit 6: Modified independent (Device/Increase time)   Comments vc for upright posture     Ambulation/Gait   Ambulation/Gait Yes   Ambulation/Gait Assistance 4: Min guard   Ambulation/Gait Assistance Details vc for posture, step length, heelstrike; closeguarding due to unsteadiness   Ambulation Distance (Feet) 120 Feet  x3   Assistive device Straight cane;None   Gait Pattern Step-through pattern;Decreased arm swing - left;Decreased step length - right;Decreased step length - left;Decreased dorsiflexion - right;Right flexed knee in stance;Left flexed knee in stance;Poor foot clearance - right;Poor foot clearance - left;Decreased arm swing - right;Trunk flexed   Ambulation Surface Level  Exercises   Other Exercises  supine on towel roll x 4 minutes with UEs abducted with scapular squeezes for anterior chest stretch; chin tucks in supine with one pillow                PT Education - 10/04/16 1703    Education provided Yes   Education Details addition to [employer]) Educated Patient   Methods Explanation;Demonstration;Verbal cues;Handout   Comprehension Verbalized understanding;Returned demonstration;Need further instruction          PT Short Term Goals - 10/02/16 1911      PT SHORT TERM GOAL #1   Title Patient/spouse will demonstrate independence in HEP for balance, ROM, and strengthening. (Target date for all STGs 09/21/16)   Time 4   Period Weeks   Status Achieved     PT SHORT TERM GOAL #2   Title Patient will demonstrate improved LE strength and balance with improved 5 times sit to stand time to <17 seconds.   Baseline 10/02/16  17.5 seconds   Time 4   Period Weeks   Status Partially Met     PT SHORT TERM GOAL #3   Title Patient will improve gait velocity to >= 2.62 ft/sec (safe community ambulator velocity) while demonstrating appropriate use of assistive device.   Baseline 10/02/16 2.66 ft/sec   Time 4   Period Weeks   Status Achieved     PT SHORT  TERM GOAL #4   Title Patient will improve FGA score by 3 points from his baseline to indicate a lesser risk of falling.    Baseline 08/28/16 scored 16/30 ; 10/02/16  12/30 (returned after missing several weeks of PT due to illness)   Time 4   Period Weeks   Status Not Met     PT SHORT TERM GOAL #5   Title Patient/spouse will complete education on fall prevention.    Time 4   Period Weeks   Status On-going           PT Long Term Goals - 10/04/16 1031      PT LONG TERM GOAL #1   Title Patient/spouse will demonstrate independence in updated/final HEP for balance, ROM, and strengthening. (Target date for all LTGs 10/23/16)   Time 8   Period Weeks   Status New     PT LONG TERM GOAL #2   Title Patient will improve TUG normal time to <13.5 seconds, indicative of a lesser risk of falling.    Time 8   Period Weeks   Status New     PT LONG TERM GOAL #3   Title Patient will improve gait velocity to >=2.86 ft/sec (10% less than normal speed for healthy 80-89 yr olds, 3.18 ft/sec).   Time 8   Period Weeks   Status New     PT LONG TERM GOAL #4   Title Patient will improve his FGA score by 2 points compared to score at 4 weeks, indicative of a lesser fall risk.   Baseline 10/04/16 16/30   Time 8   Period Weeks   Status New               Plan - 10/04/16 1705    Clinical Impression Statement Session focused on gait, balance, and strength training. Patient looked better today and reports he was not overly fatigued or sore from return to PT. Progressing towards goals.    Rehab Potential Good   Clinical Impairments Affecting Rehab Potential decreased ability to  understand written materials (does have a supportive wife that will assist him)   PT Frequency 2x / week   PT Duration 8 weeks   PT Treatment/Interventions ADLs/Self Care Home Management;DME Instruction;Gait training;Stair training;Functional mobility training;Therapeutic activities;Therapeutic exercise;Balance  training;Neuromuscular re-education;Patient/family education;Manual techniques;Passive range of motion   PT Next Visit Plan Work on posture, LE strengthening, high level balance and gait (with and without cane); possibly on compliant/outdoor surfaces,    Consulted and Agree with Plan of Care Patient      Patient will benefit from skilled therapeutic intervention in order to improve the following deficits and impairments:  Abnormal gait, Decreased balance, Decreased knowledge of use of DME, Decreased mobility, Decreased range of motion, Impaired flexibility, Postural dysfunction  Visit Diagnosis: Other abnormalities of gait and mobility  Unsteadiness on feet  Abnormal posture     Problem List Patient Active Problem List   Diagnosis Date Noted  . Urinary frequency 09/12/2016  . Cough 09/12/2016  . Prediabetes 05/01/2016  . Pain in both lower extremities 05/01/2016  . Bilateral hearing loss 05/01/2016  . Migraine 04/26/2015  . Aortic valve replaced 04/21/2015  . Subacute confusional state 12/14/2014  . Dyspnea 09/16/2013  . Pulmonary infiltrates 07/31/2013  . Encounter for therapeutic drug monitoring 03/20/2013  . CAD (coronary artery disease) 03/13/2013  . Atrial fibrillation, controlled ([place]) 03/13/2013  . Protein-calorie malnutrition, severe ([place]) 02/27/2013  . S/P CABG x 3 02/20/2013  . History of embolectomy 09/22/2012  . Renal insufficiency, mild 09/22/2012  . Syncope 09/15/2012  . Peripheral vascular disease, unspecified ([place]) 07/08/2012  . Mitral stenosis 07/27/2010  . Hyperlipidemia 10/25/2009  . Essential hypertension 10/25/2009  . NEPHROLITHIASIS, HX OF 10/25/2009  . SLEEP DISORDER 11/29/2008  . SNORING 10/14/2008  . APNEA 10/05/2008  . SKIN CANCER, HX OF 10/05/2008  . RESTLESS LEG SYNDROME 09/11/2007  . Allergic rhinitis, cause unspecified 09/11/2007  . GOUT 03/15/2006  . ASTHMA 03/15/2006  . COPD 03/15/2006  . Osteoporosis 03/15/2006  . COLONIC POLYPS,  HX OF 03/15/2006    Rexanne Mano, PT 10/04/2016, 5:07 PM  [facility] [address] [place], [place], [address] Phone: [phone]   Fax:  [phone]  Name: Robert Richard MRN: [id number] Date of Birth: Oct 10, 1930

## 2016-10-09 ENCOUNTER — Ambulatory Visit: Payer: Medicare Other | Admitting: Physical Therapy

## 2016-10-09 ENCOUNTER — Encounter: Payer: Self-pay | Admitting: Physical Therapy

## 2016-10-09 DIAGNOSIS — R293 Abnormal posture: Secondary | ICD-10-CM | POA: Diagnosis not present

## 2016-10-09 DIAGNOSIS — R29898 Other symptoms and signs involving the musculoskeletal system: Secondary | ICD-10-CM | POA: Diagnosis not present

## 2016-10-09 DIAGNOSIS — R2681 Unsteadiness on feet: Secondary | ICD-10-CM

## 2016-10-09 DIAGNOSIS — R2689 Other abnormalities of gait and mobility: Secondary | ICD-10-CM | POA: Diagnosis not present

## 2016-10-09 NOTE — Therapy (Signed)
[facility] [address] [place], [place], [address] Phone: [phone]   Fax:  [phone]  Physical Therapy Treatment  Patient Details  Name: Robert Richard MRN: [id number] Date of Birth: [birth year] Referring Provider: Dorris Carnes  Encounter Date: 10/09/2016      PT End of Session - 10/09/16 1919    Visit Number 8   Number of Visits 17   Date for PT Re-Evaluation 10/23/16   Authorization Type UHC Medicare  G-code every 10th visit   Authorization Time Period 08/24/16 to 10/23/16   PT Start Time 0932   PT Stop Time 1015   PT Time Calculation (min) 43 min   Equipment Utilized During Treatment Gait belt   Activity Tolerance Patient tolerated treatment well   Behavior During Therapy [facility] for tasks assessed/performed      Past Medical History:  Diagnosis Date  . Allergic rhinitis   . Anemia   . Arthritis    SHOULDER  . Asthma with bronchitis   . Colon polyp   . COPD (chronic obstructive pulmonary disease) (HCC)    Astmatic bronchitis  . Dysrhythmia 08/2012   NEW ONSET ATRIAL FIBRILATION  . Gout    PMH  . Headache(784.0)   . HLD (hyperlipidemia)   . Nephrolithiasis   . Osteoporosis   . RLS (restless legs syndrome)   . Skin cancer (melanoma) ([place]) 2012   Right neck  . Skin cancer, basal cell    Dr Nevada Crane, [facility]    Past Surgical History:  Procedure Laterality Date  . AORTIC VALVE REPLACEMENT N/A 02/20/2013   Procedure: AORTIC VALVE REPLACEMENT (AVR);  Surgeon: Ivin Poot, MD;  Location: [place];  Service: Open Heart Surgery;  Laterality: N/A;  . COLONOSCOPY W/ POLYPECTOMY     Dr Deatra Ina  . CORONARY ARTERY BYPASS GRAFT N/A 02/20/2013   Procedure: CORONARY ARTERY BYPASS GRAFTING (CABG);  Surgeon: Ivin Poot, MD;  Location: [place];  Service: Open Heart Surgery;  Laterality: N/A;  . FINGER SURGERY     for foreign body  . INGUINAL HERNIA REPAIR    . INTRAOPERATIVE TRANSESOPHAGEAL ECHOCARDIOGRAM N/A 02/20/2013    Procedure: INTRAOPERATIVE TRANSESOPHAGEAL ECHOCARDIOGRAM;  Surgeon: Ivin Poot, MD;  Location: [place];  Service: Open Heart Surgery;  Laterality: N/A;  . LEFT AND RIGHT HEART CATHETERIZATION WITH CORONARY ANGIOGRAM N/A 01/05/2013   Procedure: LEFT AND RIGHT HEART CATHETERIZATION WITH CORONARY ANGIOGRAM;  Surgeon: Blane Ohara, MD;  Location: [facility] CATH LAB;  Service: Cardiovascular;  Laterality: N/A;  . LEG SURGERY  06/2010    Dr.Lawson, for blood clott. Left leg   . NECK SURGERY  12/2010   Melanoma ; [facility]   . NOSE SURGERY     Septal Deviation  . RIGHT HEART CATHETERIZATION N/A 01/23/2013   Procedure: RIGHT HEART CATH;  Surgeon: Jolaine Artist, MD;  Location: [facility] CATH LAB;  Service: Cardiovascular;  Laterality: N/A;  . TEE WITHOUT CARDIOVERSION N/A 01/23/2013   Procedure: TRANSESOPHAGEAL ECHOCARDIOGRAM (TEE);  Surgeon: Jolaine Artist, MD;  Location: [facility] ENDOSCOPY;  Service: Cardiovascular;  Laterality: N/A;  sign heart cath consent w/tee consent    There were no vitals filed for this visit.      Subjective Assessment - 10/09/16 0934    Subjective No changes. No falls or near falls.    Limitations Walking   How long can you walk comfortably? varies day to day; can walk driveway 3-4 times some days, other days only once; leg  fatigue causes him to sit   Patient Stated Goals Walking better; walk without the cane (has used for 3 yrs)   Currently in Pain? No/denies            [facility] PT Assessment - 10/09/16 0954      Special Tests    Special Tests Leg LengthTest   Leg length test  True     True   Right --  37 3/8"   Left  --  36 1/2"   Comments Pt reports h/o 2 fractures to LLE                      [facility] Adult PT Treatment/Exercise - 10/09/16 0954      Bed Mobility   Supine to Sit 6: Modified independent (Device/Increase time)   Sit to Supine 6: Modified independent (Device/Increase time)     Transfers   Transfers Sit to Stand   Sit to  Stand 6: Modified independent (Device/Increase time)   Stand to Sit 6: Modified independent (Device/Increase time)   Comments vc for upright posture     Ambulation/Gait   Ambulation/Gait Yes   Ambulation/Gait Assistance 4: Min guard   Ambulation/Gait Assistance Details vc for posture, step length, rt foot clearance; LE leg length assessed and noted RLE longer than LLE; walked with 6/8" heel lift in lt shoe and noted not dragging/scuffing Rt foot    Ambulation Distance (Feet) 120 Feet  x3   Assistive device Straight cane;None   Gait Pattern Step-through pattern;Decreased arm swing - left;Decreased step length - right;Decreased step length - left;Decreased dorsiflexion - right;Right flexed knee in stance;Left flexed knee in stance;Poor foot clearance - right;Poor foot clearance - left;Decreased arm swing - right;Trunk flexed;Trendelenburg;Lateral hip instability   Ambulation Surface Level     Posture/Postural Control   Posture/Postural Control Postural limitations   Postural Limitations Forward head;Rounded Shoulders;Increased thoracic kyphosis   Posture Comments pt required vc for scapular retraction and upright posture with all activities     Knee/Hip Exercises: Stretches   Hip Flexor Stretch Both;2 reps;30 seconds   Hip Flexor Stretch Limitations "Thomas test" position             Balance Exercises - 10/09/16 1914      Balance Exercises: Standing   Standing Eyes Opened Narrow base of support (BOS);Foam/compliant surface;30 secs  red mat   Standing Eyes Closed Narrow base of support (BOS);Foam/compliant surface   SLS with Vectors Foam/compliant surface  single and double cone taps   Stepping Strategy Anterior;Posterior;Lateral;Foam/compliant surface   Retro Gait Foam/compliant surface;3 reps  red mat   Sidestepping Foam/compliant support   Marching Limitations 4 lengths red mat           PT Education - 10/09/16 1917    Education provided Yes   Education Details  measured bil LE greater trochanter to medial malleolus with RLE 7/8" longer than LLE; effect on gait, dragging rt foot; possible to get up to 1/2" insert and >1/2" will need shoe modification/built up   Person(s) Educated Patient;Spouse   Methods Explanation;Demonstration;Handout  picture re: heel lift to purchase   Comprehension Verbalized understanding;Need further instruction          PT Short Term Goals - 10/02/16 1911      PT SHORT TERM GOAL #1   Title Patient/spouse will demonstrate independence in HEP for balance, ROM, and strengthening. (Target date for all STGs 09/21/16)   Time 4   Period Weeks  Status Achieved     PT SHORT TERM GOAL #2   Title Patient will demonstrate improved LE strength and balance with improved 5 times sit to stand time to <17 seconds.   Baseline 10/02/16  17.5 seconds   Time 4   Period Weeks   Status Partially Met     PT SHORT TERM GOAL #3   Title Patient will improve gait velocity to >= 2.62 ft/sec (safe community ambulator velocity) while demonstrating appropriate use of assistive device.   Baseline 10/02/16 2.66 ft/sec   Time 4   Period Weeks   Status Achieved     PT SHORT TERM GOAL #4   Title Patient will improve FGA score by 3 points from his baseline to indicate a lesser risk of falling.    Baseline 08/28/16 scored 16/30 ; 10/02/16  12/30 (returned after missing several weeks of PT due to illness)   Time 4   Period Weeks   Status Not Met     PT SHORT TERM GOAL #5   Title Patient/spouse will complete education on fall prevention.    Time 4   Period Weeks   Status On-going           PT Long Term Goals - 10/04/16 1031      PT LONG TERM GOAL #1   Title Patient/spouse will demonstrate independence in updated/final HEP for balance, ROM, and strengthening. (Target date for all LTGs 10/23/16)   Time 8   Period Weeks   Status New     PT LONG TERM GOAL #2   Title Patient will improve TUG normal time to <13.5 seconds, indicative of a  lesser risk of falling.    Time 8   Period Weeks   Status New     PT LONG TERM GOAL #3   Title Patient will improve gait velocity to >=2.86 ft/sec (10% less than normal speed for healthy 80-89 yr olds, 3.18 ft/sec).   Time 8   Period Weeks   Status New     PT LONG TERM GOAL #4   Title Patient will improve his FGA score by 2 points compared to score at 4 weeks, indicative of a lesser fall risk.   Baseline 10/04/16 16/30   Time 8   Period Weeks   Status New               Plan - 10/09/16 1920    Clinical Impression Statement Session focused on balance and gait training. Noted leg length discrepancy effecting his gait (see note for details). Will continue to assess if desires referral for built up shoe. Progressing towards goals.    Rehab Potential Good   Clinical Impairments Affecting Rehab Potential decreased ability to understand written materials (does have a supportive wife that will assist him)   PT Frequency 2x / week   PT Duration 8 weeks   PT Treatment/Interventions ADLs/Self Care Home Management;DME Instruction;Gait training;Stair training;Functional mobility training;Therapeutic activities;Therapeutic exercise;Balance training;Neuromuscular re-education;Patient/family education;Manual techniques;Passive range of motion   PT Next Visit Plan ? if pt found 1/2" heel lift for LLE; ? wants to get built up shoe; Work on posture (including bil hip flexor stretch), LE strengthening, high level balance and gait (with and without cane); compliant/outdoor surfaces,    Consulted and Agree with Plan of Care Patient      Patient will benefit from skilled therapeutic intervention in order to improve the following deficits and impairments:  Abnormal gait, Decreased balance, Decreased knowledge of use of DME,  Decreased mobility, Decreased range of motion, Impaired flexibility, Postural dysfunction  Visit Diagnosis: Unsteadiness on feet  Abnormal posture  Other symptoms and signs  involving the musculoskeletal system  Other abnormalities of gait and mobility     Problem List Patient Active Problem List   Diagnosis Date Noted  . Urinary frequency 09/12/2016  . Cough 09/12/2016  . Prediabetes 05/01/2016  . Pain in both lower extremities 05/01/2016  . Bilateral hearing loss 05/01/2016  . Migraine 04/26/2015  . Aortic valve replaced 04/21/2015  . Subacute confusional state 12/14/2014  . Dyspnea 09/16/2013  . Pulmonary infiltrates 07/31/2013  . Encounter for therapeutic drug monitoring 03/20/2013  . CAD (coronary artery disease) 03/13/2013  . Atrial fibrillation, controlled ([place]) 03/13/2013  . Protein-calorie malnutrition, severe ([place]) 02/27/2013  . S/P CABG x 3 02/20/2013  . History of embolectomy 09/22/2012  . Renal insufficiency, mild 09/22/2012  . Syncope 09/15/2012  . Peripheral vascular disease, unspecified ([place]) 07/08/2012  . Mitral stenosis 07/27/2010  . Hyperlipidemia 10/25/2009  . Essential hypertension 10/25/2009  . NEPHROLITHIASIS, HX OF 10/25/2009  . SLEEP DISORDER 11/29/2008  . SNORING 10/14/2008  . APNEA 10/05/2008  . SKIN CANCER, HX OF 10/05/2008  . RESTLESS LEG SYNDROME 09/11/2007  . Allergic rhinitis, cause unspecified 09/11/2007  . GOUT 03/15/2006  . ASTHMA 03/15/2006  . COPD 03/15/2006  . Osteoporosis 03/15/2006  . COLONIC POLYPS, HX OF 03/15/2006    Rexanne Mano, PT 10/09/2016, 7:22 PM  [facility] [address] [place], [place], [address] Phone: [phone]   Fax:  [phone]  Name: Robert Richard MRN: [id number] Date of Birth: [birth year]

## 2016-10-11 ENCOUNTER — Encounter: Payer: Self-pay | Admitting: Physical Therapy

## 2016-10-11 ENCOUNTER — Ambulatory Visit (INDEPENDENT_AMBULATORY_CARE_PROVIDER_SITE_OTHER): Payer: Medicare Other | Admitting: *Deleted

## 2016-10-11 ENCOUNTER — Ambulatory Visit: Payer: Medicare Other | Admitting: Physical Therapy

## 2016-10-11 VITALS — HR 76

## 2016-10-11 DIAGNOSIS — R293 Abnormal posture: Secondary | ICD-10-CM | POA: Diagnosis not present

## 2016-10-11 DIAGNOSIS — R2681 Unsteadiness on feet: Secondary | ICD-10-CM | POA: Diagnosis not present

## 2016-10-11 DIAGNOSIS — R29898 Other symptoms and signs involving the musculoskeletal system: Secondary | ICD-10-CM

## 2016-10-11 DIAGNOSIS — I4891 Unspecified atrial fibrillation: Secondary | ICD-10-CM

## 2016-10-11 DIAGNOSIS — R2689 Other abnormalities of gait and mobility: Secondary | ICD-10-CM

## 2016-10-11 DIAGNOSIS — Z952 Presence of prosthetic heart valve: Secondary | ICD-10-CM | POA: Diagnosis not present

## 2016-10-11 DIAGNOSIS — Z5181 Encounter for therapeutic drug level monitoring: Secondary | ICD-10-CM | POA: Diagnosis not present

## 2016-10-11 LAB — POCT INR: INR: 2.4

## 2016-10-11 NOTE — Patient Instructions (Addendum)
HIP: Flexors - Supine    Lie on edge of surface. Place leg off the surface, allow knee to bend. Bring other knee toward chest. Hold 5-10 seconds. 2-3 reps per set, 1-2 sets per day, 7 days per week Rest lowered foot on stool.  Copyright  VHI. All rights reserved.  Chin Tuck    Tuck chin in, then down. Hold 20 seconds. Relax. Repeat 2-3 times. Do 2-3 sessions per day.  http://gt2.exer.us/836   Copyright  VHI. All rights reserved.  CHEST: Doorway, Unilateral - Standing    Standing in doorway, place one hand on top of doorway with elbow straight. . Hold 2 breaths. Repeat other arm, then both arms overhead Perform every time you leave the restroom at home  Copyright  VHI. All rights reserved.

## 2016-10-11 NOTE — Therapy (Signed)
[facility] [address] [place], [place], [address] Phone: [phone]   Fax:  [phone]  Physical Therapy Treatment  Patient Details  Name: Robert Richard MRN: [id number] Date of Birth: May 07, 1930 Referring Provider: Dorris Carnes  Encounter Date: 10/11/2016      PT End of Session - 10/11/16 0935    Visit Number 9   Number of Visits 17   Date for PT Re-Evaluation 10/23/16   Authorization Type UHC Medicare  G-code every 10th visit   Authorization Time Period 08/24/16 to 10/23/16   PT Start Time 0930   PT Stop Time 1016   PT Time Calculation (min) 46 min   Activity Tolerance Patient tolerated treatment well   Behavior During Therapy [facility] for tasks assessed/performed      Past Medical History:  Diagnosis Date  . Allergic rhinitis   . Anemia   . Arthritis    SHOULDER  . Asthma with bronchitis   . Colon polyp   . COPD (chronic obstructive pulmonary disease) (HCC)    Astmatic bronchitis  . Dysrhythmia 08/2012   NEW ONSET ATRIAL FIBRILATION  . Gout    PMH  . Headache(784.0)   . HLD (hyperlipidemia)   . Nephrolithiasis   . Osteoporosis   . RLS (restless legs syndrome)   . Skin cancer (melanoma) ([place]) 2012   Right neck  . Skin cancer, basal cell    Dr Nevada Crane, [facility]    Past Surgical History:  Procedure Laterality Date  . AORTIC VALVE REPLACEMENT N/A 02/20/2013   Procedure: AORTIC VALVE REPLACEMENT (AVR);  Surgeon: Ivin Poot, MD;  Location: [place];  Service: Open Heart Surgery;  Laterality: N/A;  . COLONOSCOPY W/ POLYPECTOMY     Dr Deatra Ina  . CORONARY ARTERY BYPASS GRAFT N/A 02/20/2013   Procedure: CORONARY ARTERY BYPASS GRAFTING (CABG);  Surgeon: Ivin Poot, MD;  Location: [place];  Service: Open Heart Surgery;  Laterality: N/A;  . FINGER SURGERY     for foreign body  . INGUINAL HERNIA REPAIR    . INTRAOPERATIVE TRANSESOPHAGEAL ECHOCARDIOGRAM N/A 02/20/2013   Procedure: INTRAOPERATIVE TRANSESOPHAGEAL  ECHOCARDIOGRAM;  Surgeon: Ivin Poot, MD;  Location: [place];  Service: Open Heart Surgery;  Laterality: N/A;  . LEFT AND RIGHT HEART CATHETERIZATION WITH CORONARY ANGIOGRAM N/A 01/05/2013   Procedure: LEFT AND RIGHT HEART CATHETERIZATION WITH CORONARY ANGIOGRAM;  Surgeon: Blane Ohara, MD;  Location: [facility] CATH LAB;  Service: Cardiovascular;  Laterality: N/A;  . LEG SURGERY  06/2010    Dr.Lawson, for blood clott. Left leg   . NECK SURGERY  12/2010   Melanoma ; [facility]   . NOSE SURGERY     Septal Deviation  . RIGHT HEART CATHETERIZATION N/A 01/23/2013   Procedure: RIGHT HEART CATH;  Surgeon: Jolaine Artist, MD;  Location: [facility] CATH LAB;  Service: Cardiovascular;  Laterality: N/A;  . TEE WITHOUT CARDIOVERSION N/A 01/23/2013   Procedure: TRANSESOPHAGEAL ECHOCARDIOGRAM (TEE);  Surgeon: Jolaine Artist, MD;  Location: [place];  Service: Cardiovascular;  Laterality: N/A;  sign heart cath consent w/tee consent    Vitals:   10/11/16 1349 10/11/16 1350  Pulse: 81 76  SpO2: 99%         Subjective Assessment - 10/11/16 0930    Subjective No changes or problems to report.   Limitations Walking   How long can you walk comfortably? varies day to day; can walk driveway 3-4 times some days, other days only once; leg fatigue  causes him to sit   Patient Stated Goals Walking better; walk without the cane (has used for 3 yrs)   Currently in Pain? No/denies                         [facility] Adult PT Treatment/Exercise - 10/11/16 0935      Transfers   Transfers Sit to Stand   Sit to Stand 6: Modified independent (Device/Increase time)   Stand to Sit 6: Modified independent (Device/Increase time)     Ambulation/Gait   Ambulation/Gait Yes   Ambulation/Gait Assistance 4: Min guard   Ambulation/Gait Assistance Details verbal, tactile cues for posture, increase step length  trialed rubber quad tip but no change from std tip.    Ambulation Distance (Feet) 115 Feet    Assistive device Straight cane   Gait Pattern Step-through pattern   Ambulation Surface Level     Posture/Postural Control   Posture/Postural Control Postural limitations   Postural Limitations Forward head;Rounded Shoulders;Increased thoracic kyphosis   Posture Comments pt required verbal and tactile cues for posture     Exercises   Other Exercises  Sitting and standing cervical retraction stretches with towel roll; 5x10sec. Standing overhead shoulder stretch in doorway; 2x each arm then 2x both arms; hold for 2 deep breaths. PT provided verbal, visual, tactile cues for proper form to maximize stretch.     Knee/Hip Exercises: Stretches   Hip Flexor Stretch Both;2 reps;20 seconds   Hip Flexor Stretch Limitations Supine hip flexor stretch in Chadwick position with 3x5sec glute set holds.  PT provided verbal, visual, tactile cues for proper form to maximize stretch.     Knee/Hip Exercises: Aerobic   Nustep Level 3 for 8 min BUEs & BLEs  HR prior: 81 bpm; HR after: 76 bpm; verbal cues pace & ROM                PT Education - 10/11/16 0934    Education provided Yes   Education Details Updated HEP with hip flexor stretch & posture stretch at doorframe;  need for new tip on cane; continue with his 1/2" soft lift and monitor pain in hip or back If so switch to 1/4" lift. PT also issued firm 1/2" lift if his compresses.    Person(s) Educated Patient   Methods Explanation;Demonstration;Handout;Verbal cues;Tactile cues   Comprehension Verbalized understanding;Returned demonstration;Need further instruction;Verbal cues required          PT Short Term Goals - 10/02/16 1911      PT SHORT TERM GOAL #1   Title Patient/spouse will demonstrate independence in HEP for balance, ROM, and strengthening. (Target date for all STGs 09/21/16)   Time 4   Period Weeks   Status Achieved     PT SHORT TERM GOAL #2   Title Patient will demonstrate improved LE strength and balance with improved 5  times sit to stand time to <17 seconds.   Baseline 10/02/16  17.5 seconds   Time 4   Period Weeks   Status Partially Met     PT SHORT TERM GOAL #3   Title Patient will improve gait velocity to >= 2.62 ft/sec (safe community ambulator velocity) while demonstrating appropriate use of assistive device.   Baseline 10/02/16 2.66 ft/sec   Time 4   Period Weeks   Status Achieved     PT SHORT TERM GOAL #4   Title Patient will improve FGA score by 3 points from his baseline to indicate a  lesser risk of falling.    Baseline 08/28/16 scored 16/30 ; 10/02/16  12/30 (returned after missing several weeks of PT due to illness)   Time 4   Period Weeks   Status Not Met     PT SHORT TERM GOAL #5   Title Patient/spouse will complete education on fall prevention.    Time 4   Period Weeks   Status On-going           PT Long Term Goals - 10/04/16 1031      PT LONG TERM GOAL #1   Title Patient/spouse will demonstrate independence in updated/final HEP for balance, ROM, and strengthening. (Target date for all LTGs 10/23/16)   Time 8   Period Weeks   Status New     PT LONG TERM GOAL #2   Title Patient will improve TUG normal time to <13.5 seconds, indicative of a lesser risk of falling.    Time 8   Period Weeks   Status New     PT LONG TERM GOAL #3   Title Patient will improve gait velocity to >=2.86 ft/sec (10% less than normal speed for healthy 80-89 yr olds, 3.18 ft/sec).   Time 8   Period Weeks   Status New     PT LONG TERM GOAL #4   Title Patient will improve his FGA score by 2 points compared to score at 4 weeks, indicative of a lesser fall risk.   Baseline 10/04/16 16/30   Time 8   Period Weeks   Status New               Plan - 10/11/16 0935    Clinical Impression Statement Today's session focused on gait training with SPC and firm heel insert in L shoe, stretches to improve forward head posture and thoracic kyphosis, and cardiovascular endurance. Patient is progressing  toward goals and will benefit from continued therapy to address deficits.   Rehab Potential Good   Clinical Impairments Affecting Rehab Potential decreased ability to understand written materials (does have a supportive wife that will assist him)   PT Frequency 2x / week   PT Duration 8 weeks   PT Treatment/Interventions ADLs/Self Care Home Management;DME Instruction;Gait training;Stair training;Functional mobility training;Therapeutic activities;Therapeutic exercise;Balance training;Neuromuscular re-education;Patient/family education;Manual techniques;Passive range of motion   PT Next Visit Plan Do G-code with TUG, Cognitive TUG, Gait Velocity & 5x sit to/from stand; assess pain & gait with 1/2" heel lift; progress posture, LE strengthening, high level balance and gait (with and without cane); compliant/outdoor surfaces, obstacles, cardiovascular endurance   Consulted and Agree with Plan of Care Patient      Patient will benefit from skilled therapeutic intervention in order to improve the following deficits and impairments:  Abnormal gait, Decreased balance, Decreased knowledge of use of DME, Decreased mobility, Decreased range of motion, Impaired flexibility, Postural dysfunction  Visit Diagnosis: Unsteadiness on feet  Abnormal posture  Other symptoms and signs involving the musculoskeletal system  Other abnormalities of gait and mobility     Problem List Patient Active Problem List   Diagnosis Date Noted  . Urinary frequency 09/12/2016  . Cough 09/12/2016  . Prediabetes 05/01/2016  . Pain in both lower extremities 05/01/2016  . Bilateral hearing loss 05/01/2016  . Migraine 04/26/2015  . Aortic valve replaced 04/21/2015  . Subacute confusional state 12/14/2014  . Dyspnea 09/16/2013  . Pulmonary infiltrates 07/31/2013  . Encounter for therapeutic drug monitoring 03/20/2013  . CAD (coronary artery disease) 03/13/2013  . Atrial  fibrillation, controlled ([place]) 03/13/2013  .  Protein-calorie malnutrition, severe ([place]) 02/27/2013  . S/P CABG x 3 02/20/2013  . History of embolectomy 09/22/2012  . Renal insufficiency, mild 09/22/2012  . Syncope 09/15/2012  . Peripheral vascular disease, unspecified ([place]) 07/08/2012  . Mitral stenosis 07/27/2010  . Hyperlipidemia 10/25/2009  . Essential hypertension 10/25/2009  . NEPHROLITHIASIS, HX OF 10/25/2009  . SLEEP DISORDER 11/29/2008  . SNORING 10/14/2008  . APNEA 10/05/2008  . SKIN CANCER, HX OF 10/05/2008  . RESTLESS LEG SYNDROME 09/11/2007  . Allergic rhinitis, cause unspecified 09/11/2007  . GOUT 03/15/2006  . ASTHMA 03/15/2006  . COPD 03/15/2006  . Osteoporosis 03/15/2006  . COLONIC POLYPS, HX OF 03/15/2006   Gershon Crane, SPT 10/11/2016, 4:02 PM  Jamey Reas, PT, DPT 10/11/2016, 9:53 PM  [facility] [address] [place], [place], [address] Phone: [phone]   Fax:  [phone]  Name: Robert Richard MRN: [id number] Date of Birth: 1930-03-09

## 2016-10-16 ENCOUNTER — Ambulatory Visit: Payer: Medicare Other | Admitting: Physical Therapy

## 2016-10-16 ENCOUNTER — Encounter: Payer: Self-pay | Admitting: Physical Therapy

## 2016-10-16 DIAGNOSIS — R2681 Unsteadiness on feet: Secondary | ICD-10-CM | POA: Diagnosis not present

## 2016-10-16 DIAGNOSIS — R2689 Other abnormalities of gait and mobility: Secondary | ICD-10-CM | POA: Diagnosis not present

## 2016-10-16 DIAGNOSIS — R293 Abnormal posture: Secondary | ICD-10-CM | POA: Diagnosis not present

## 2016-10-16 DIAGNOSIS — R29898 Other symptoms and signs involving the musculoskeletal system: Secondary | ICD-10-CM

## 2016-10-16 NOTE — Therapy (Signed)
[place] [address] [place], [place], [address] Phone: [phone]   Fax:  [phone]  Physical Therapy Treatment  Patient Details  Name: Robert Richard MRN: [id number] Date of Birth: 1930-12-30 Referring Provider: Dorris Carnes  Encounter Date: 10/16/2016      PT End of Session - 10/16/16 2052    Visit Number 10   Number of Visits 17   Date for PT Re-Evaluation 10/23/16   Authorization Type UHC Medicare  G-code every 10th visit   Authorization Time Period 08/24/16 to 10/23/16   PT Start Time 0931   PT Stop Time 1011   PT Time Calculation (min) 40 min   Equipment Utilized During Treatment --  used pt's leather belt   Activity Tolerance Patient tolerated treatment well   Behavior During Therapy [facility] for tasks assessed/performed  did seem a bit confused compared to other days--difficulty following instructions to continue reps until reaches 10, requiring max cues to complete a set      Past Medical History:  Diagnosis Date  . Allergic rhinitis   . Anemia   . Arthritis    SHOULDER  . Asthma with bronchitis   . Colon polyp   . COPD (chronic obstructive pulmonary disease) (HCC)    Astmatic bronchitis  . Dysrhythmia 08/2012   NEW ONSET ATRIAL FIBRILATION  . Gout    PMH  . Headache(784.0)   . HLD (hyperlipidemia)   . Nephrolithiasis   . Osteoporosis   . RLS (restless legs syndrome)   . Skin cancer (melanoma) ([place]) 2012   Right neck  . Skin cancer, basal cell    Dr Nevada Crane, [facility]    Past Surgical History:  Procedure Laterality Date  . AORTIC VALVE REPLACEMENT N/A 02/20/2013   Procedure: AORTIC VALVE REPLACEMENT (AVR);  Surgeon: Ivin Poot, MD;  Location: [place];  Service: Open Heart Surgery;  Laterality: N/A;  . COLONOSCOPY W/ POLYPECTOMY     Dr Deatra Ina  . CORONARY ARTERY BYPASS GRAFT N/A 02/20/2013   Procedure: CORONARY ARTERY BYPASS GRAFTING (CABG);  Surgeon: Ivin Poot, MD;  Location: [place];   Service: Open Heart Surgery;  Laterality: N/A;  . FINGER SURGERY     for foreign body  . INGUINAL HERNIA REPAIR    . INTRAOPERATIVE TRANSESOPHAGEAL ECHOCARDIOGRAM N/A 02/20/2013   Procedure: INTRAOPERATIVE TRANSESOPHAGEAL ECHOCARDIOGRAM;  Surgeon: Ivin Poot, MD;  Location: [facility];  Service: Open Heart Surgery;  Laterality: N/A;  . LEFT AND RIGHT HEART CATHETERIZATION WITH CORONARY ANGIOGRAM N/A 01/05/2013   Procedure: LEFT AND RIGHT HEART CATHETERIZATION WITH CORONARY ANGIOGRAM;  Surgeon: Blane Ohara, MD;  Location: [facility] CATH LAB;  Service: Cardiovascular;  Laterality: N/A;  . LEG SURGERY  06/2010    Dr.Lawson, for blood clott. Left leg   . NECK SURGERY  12/2010   Melanoma ; [facility]   . NOSE SURGERY     Septal Deviation  . RIGHT HEART CATHETERIZATION N/A 01/23/2013   Procedure: RIGHT HEART CATH;  Surgeon: Jolaine Artist, MD;  Location: [facility] CATH LAB;  Service: Cardiovascular;  Laterality: N/A;  . TEE WITHOUT CARDIOVERSION N/A 01/23/2013   Procedure: TRANSESOPHAGEAL ECHOCARDIOGRAM (TEE);  Surgeon: Jolaine Artist, MD;  Location: [facility] ENDOSCOPY;  Service: Cardiovascular;  Laterality: N/A;  sign heart cath consent w/tee consent    There were no vitals filed for this visit.      Subjective Assessment - 10/16/16 0930    Subjective No changes or problems to report. Can't  tell much difference in walking with heel lift.    Limitations Walking   How long can you walk comfortably? varies day to day; can walk driveway 3-4 times some days, other days only once; leg fatigue causes him to sit   Patient Stated Goals Walking better; walk without the cane (has used for 3 yrs)   Currently in Pain? No/denies            [facility] PT Assessment - 10/16/16 0937      Ambulation/Gait   Ambulation/Gait Assistance 4: Min guard;4: Min assist   Ambulation/Gait Assistance Details vc for heelstrike (esp on Rt), upright posture; without cane had several stagger steps and catching Rt foot; with  cane requires vc for proper placement   Ambulation Distance (Feet) 120 Feet  x5   Assistive device None;Straight cane   Gait Pattern Step-through pattern;Decreased arm swing - right;Decreased arm swing - left;Decreased step length - right;Decreased step length - left;Right foot flat;Left foot flat;Shuffle;Trunk flexed;Poor foot clearance - right   Ambulation Surface Level   Gait velocity 32.8/11.5=2.85     Standardized Balance Assessment   Standardized Balance Assessment Five Times Sit to Stand   Five times sit to stand comments  16.41  (using armrests)     Timed Up and Go Test   TUG Normal TUG;Cognitive TUG   Normal TUG (seconds) 12.75  no cane   Cognitive TUG (seconds) 21.91                     [facility] Adult PT Treatment/Exercise - 10/16/16 0937      Posture/Postural Control   Posture/Postural Control Postural limitations   Postural Limitations Rounded Shoulders;Forward head;Increased thoracic kyphosis   Posture Comments reviewed posture stretch added to HEP last visit. Pt required max cues for proper technique. frequent vc to correct posture, however noted pt self-correcting several times                Balance Exercises - 10/16/16 2047      Balance Exercises: Standing   Standing Eyes Opened Narrow base of support (BOS);Head turns;Foam/compliant surface  feet together, feet staggered   Standing Eyes Closed Narrow base of support (BOS);Foam/compliant surface;30 secs   Step Ups Lateral;6 inch;UE support 2           PT Education - 10/16/16 2049    Education provided Yes   Education Details new heel lift he purchased is felt and has already compressed from 1/2" to 1/4"; recommend add 1/4" lift given last session under his felt heel lift (and both under the shoe's insert)   Person(s) Educated Patient;Spouse   Methods Explanation;Demonstration   Comprehension Verbalized understanding;Need further instruction          PT Short Term Goals - 10/02/16  1911      PT SHORT TERM GOAL #1   Title Patient/spouse will demonstrate independence in HEP for balance, ROM, and strengthening. (Target date for all STGs 09/21/16)   Time 4   Period Weeks   Status Achieved     PT SHORT TERM GOAL #2   Title Patient will demonstrate improved LE strength and balance with improved 5 times sit to stand time to <17 seconds.   Baseline 10/02/16  17.5 seconds   Time 4   Period Weeks   Status Partially Met     PT SHORT TERM GOAL #3   Title Patient will improve gait velocity to >= 2.62 ft/sec (safe community ambulator velocity) while demonstrating appropriate use  of assistive device.   Baseline 10/02/16 2.66 ft/sec   Time 4   Period Weeks   Status Achieved     PT SHORT TERM GOAL #4   Title Patient will improve FGA score by 3 points from his baseline to indicate a lesser risk of falling.    Baseline 08/28/16 scored 16/30 ; 10/02/16  12/30 (returned after missing several weeks of PT due to illness)   Time 4   Period Weeks   Status Not Met     PT SHORT TERM GOAL #5   Title Patient/spouse will complete education on fall prevention.    Time 4   Period Weeks   Status On-going           PT Long Term Goals - 10/04/16 1031      PT LONG TERM GOAL #1   Title Patient/spouse will demonstrate independence in updated/final HEP for balance, ROM, and strengthening. (Target date for all LTGs 10/23/16)   Time 8   Period Weeks   Status New     PT LONG TERM GOAL #2   Title Patient will improve TUG normal time to <13.5 seconds, indicative of a lesser risk of falling.    Time 8   Period Weeks   Status New     PT LONG TERM GOAL #3   Title Patient will improve gait velocity to >=2.86 ft/sec (10% less than normal speed for healthy 80-89 yr olds, 3.18 ft/sec).   Time 8   Period Weeks   Status New     PT LONG TERM GOAL #4   Title Patient will improve his FGA score by 2 points compared to score at 4 weeks, indicative of a lesser fall risk.   Baseline 10/04/16  16/30   Time 8   Period Weeks   Status New               Plan - 11/03/2016 2053/05/06    Clinical Impression Statement Session focused on several measurements for G-code for 10th visit, balance and gait training. Pt slightly more unsteady than usual and slightly more confused (not just that he is [facility]). Patient making progress (see G-code measures).    Rehab Potential Good   Clinical Impairments Affecting Rehab Potential decreased ability to understand written materials (does have a supportive wife that will assist him)   PT Frequency 2x / week   PT Duration 8 weeks   PT Treatment/Interventions ADLs/Self Care Home Management;DME Instruction;Gait training;Stair training;Functional mobility training;Therapeutic activities;Therapeutic exercise;Balance training;Neuromuscular re-education;Patient/family education;Manual techniques;Passive range of motion   PT Next Visit Plan assess gait with 1/2" heel lift (see if he has 1/4" solid lift under his compressed felt 1/2" lift); progress posture, LE strengthening, high level balance and gait (with and without cane); compliant/outdoor surfaces, obstacles, cardiovascular endurance (?treadmill vs Nustep)   Consulted and Agree with Plan of Care Patient      Patient will benefit from skilled therapeutic intervention in order to improve the following deficits and impairments:  Abnormal gait, Decreased balance, Decreased knowledge of use of DME, Decreased mobility, Decreased range of motion, Impaired flexibility, Postural dysfunction  Visit Diagnosis: Unsteadiness on feet  Abnormal posture  Other symptoms and signs involving the musculoskeletal system  Other abnormalities of gait and mobility       G-Codes - 2016/11/03 May 06, 2057    Functional Assessment Tool Used (Outpatient Only) TUG 12.75, TUG cognitive 21.1, gait velocity 2.85 ft/sec, 5x sit to stand 16.41   Functional Limitation Mobility: Walking and moving  around   Mobility: Walking and Moving Around  Current Status [phone]) At least 20 percent but less than 40 percent impaired, limited or restricted   Mobility: Walking and Moving Around Goal Status [phone]) At least 1 percent but less than 20 percent impaired, limited or restricted      Problem List Patient Active Problem List   Diagnosis Date Noted  . Urinary frequency 09/12/2016  . Cough 09/12/2016  . Prediabetes 05/01/2016  . Pain in both lower extremities 05/01/2016  . Bilateral hearing loss 05/01/2016  . Migraine 04/26/2015  . Aortic valve replaced 04/21/2015  . Subacute confusional state 12/14/2014  . Dyspnea 09/16/2013  . Pulmonary infiltrates 07/31/2013  . Encounter for therapeutic drug monitoring 03/20/2013  . CAD (coronary artery disease) 03/13/2013  . Atrial fibrillation, controlled ([place]) 03/13/2013  . Protein-calorie malnutrition, severe ([place]) 02/27/2013  . S/P CABG x 3 02/20/2013  . History of embolectomy 09/22/2012  . Renal insufficiency, mild 09/22/2012  . Syncope 09/15/2012  . Peripheral vascular disease, unspecified ([place]) 07/08/2012  . Mitral stenosis 07/27/2010  . Hyperlipidemia 10/25/2009  . Essential hypertension 10/25/2009  . NEPHROLITHIASIS, HX OF 10/25/2009  . SLEEP DISORDER 11/29/2008  . SNORING 10/14/2008  . APNEA 10/05/2008  . SKIN CANCER, HX OF 10/05/2008  . RESTLESS LEG SYNDROME 09/11/2007  . Allergic rhinitis, cause unspecified 09/11/2007  . GOUT 03/15/2006  . ASTHMA 03/15/2006  . COPD 03/15/2006  . Osteoporosis 03/15/2006  . COLONIC POLYPS, HX OF 03/15/2006   Physical Therapy Progress Note  Dates of Reporting Period: 08/25/16 to 10/16/16  Objective Reports of Subjective Statement: Patient reports he feels a bit stronger.   Objective Measurements: see above for G-code  Goal Update: N/A  Plan: continue to work towards [place] and improved balance/safety  Reason Skilled Services are Required: Patient remains at higher fall risk and needs continued assessment of gait with heel lift and  ?need for shoe modification.    Rexanne Mano, PT 10/16/2016, 9:02 PM  [facility] [address] [place], [place], [address] Phone: [phone]   Fax:  [phone]  Name: Robert Richard MRN: [id number] Date of Birth: 12-04-1930

## 2016-10-18 ENCOUNTER — Ambulatory Visit: Payer: Medicare Other | Admitting: Physical Therapy

## 2016-10-18 ENCOUNTER — Encounter: Payer: Self-pay | Admitting: Physical Therapy

## 2016-10-18 DIAGNOSIS — R29898 Other symptoms and signs involving the musculoskeletal system: Secondary | ICD-10-CM

## 2016-10-18 DIAGNOSIS — R2681 Unsteadiness on feet: Secondary | ICD-10-CM | POA: Diagnosis not present

## 2016-10-18 DIAGNOSIS — R2689 Other abnormalities of gait and mobility: Secondary | ICD-10-CM | POA: Diagnosis not present

## 2016-10-18 DIAGNOSIS — R293 Abnormal posture: Secondary | ICD-10-CM | POA: Diagnosis not present

## 2016-10-18 NOTE — Patient Instructions (Signed)
ABDUCTION: Standing - Resistance Band (Active)  Prior to exercise, put chair next to counter. Sit and place green band around your ankles. Stand and face the counter with hands on counter.    Stand, feet flat. Against resistance band, lift right leg out to side.Return and lift left leg out to side.  Complete _2__ sets of _10__ repetitions. Perform _1__ sessions per day.  http://gtsc.exer.us/116   Copyright  VHI. All rights reserved.

## 2016-10-18 NOTE — Therapy (Signed)
[facility] [address] [place], [place], [address] Phone: [phone]   Fax:  [phone]  Physical Therapy Treatment  Patient Details  Name: Robert Richard MRN: [id number] Date of Birth: 17-Feb-1931 Referring Provider: Dorris Carnes  Encounter Date: 10/18/2016      PT End of Session - 10/18/16 1355    Visit Number 11   Number of Visits 17   Date for PT Re-Evaluation 10/23/16   Authorization Type UHC Medicare  G-code every 10th visit   Authorization Time Period 08/24/16 to 10/23/16   PT Start Time 0931   PT Stop Time 1016   PT Time Calculation (min) 45 min   Equipment Utilized During Treatment Gait belt  used pt's leather belt   Activity Tolerance Patient tolerated treatment well   Behavior During Therapy [facility] for tasks assessed/performed  did seem a bit confused compared to other days--difficulty following instructions to continue reps until reaches 10, requiring max cues to complete a set      Past Medical History:  Diagnosis Date  . Allergic rhinitis   . Anemia   . Arthritis    SHOULDER  . Asthma with bronchitis   . Colon polyp   . COPD (chronic obstructive pulmonary disease) (HCC)    Astmatic bronchitis  . Dysrhythmia 08/2012   NEW ONSET ATRIAL FIBRILATION  . Gout    PMH  . Headache(784.0)   . HLD (hyperlipidemia)   . Nephrolithiasis   . Osteoporosis   . RLS (restless legs syndrome)   . Skin cancer (melanoma) ([place]) 2012   Right neck  . Skin cancer, basal cell    Dr Nevada Crane, [facility]    Past Surgical History:  Procedure Laterality Date  . AORTIC VALVE REPLACEMENT N/A 02/20/2013   Procedure: AORTIC VALVE REPLACEMENT (AVR);  Surgeon: Ivin Poot, MD;  Location: [facility];  Service: Open Heart Surgery;  Laterality: N/A;  . COLONOSCOPY W/ POLYPECTOMY     Dr Deatra Ina  . CORONARY ARTERY BYPASS GRAFT N/A 02/20/2013   Procedure: CORONARY ARTERY BYPASS GRAFTING (CABG);  Surgeon: Ivin Poot, MD;  Location: [facility];  Service: Open Heart Surgery;  Laterality: N/A;  . FINGER SURGERY     for foreign body  . INGUINAL HERNIA REPAIR    . INTRAOPERATIVE TRANSESOPHAGEAL ECHOCARDIOGRAM N/A 02/20/2013   Procedure: INTRAOPERATIVE TRANSESOPHAGEAL ECHOCARDIOGRAM;  Surgeon: Ivin Poot, MD;  Location: [facility];  Service: Open Heart Surgery;  Laterality: N/A;  . LEFT AND RIGHT HEART CATHETERIZATION WITH CORONARY ANGIOGRAM N/A 01/05/2013   Procedure: LEFT AND RIGHT HEART CATHETERIZATION WITH CORONARY ANGIOGRAM;  Surgeon: Blane Ohara, MD;  Location: [facility] CATH LAB;  Service: Cardiovascular;  Laterality: N/A;  . LEG SURGERY  06/2010    Dr.Lawson, for blood clott. Left leg   . NECK SURGERY  12/2010   Melanoma ; [facility]   . NOSE SURGERY     Septal Deviation  . RIGHT HEART CATHETERIZATION N/A 01/23/2013   Procedure: RIGHT HEART CATH;  Surgeon: Jolaine Artist, MD;  Location: [facility] CATH LAB;  Service: Cardiovascular;  Laterality: N/A;  . TEE WITHOUT CARDIOVERSION N/A 01/23/2013   Procedure: TRANSESOPHAGEAL ECHOCARDIOGRAM (TEE);  Surgeon: Jolaine Artist, MD;  Location: [facility] ENDOSCOPY;  Service: Cardiovascular;  Laterality: N/A;  sign heart cath consent w/tee consent    There were no vitals filed for this visit.      Subjective Assessment - 10/18/16 0934    Subjective No changes. has been using heel  lift in Lt shoe. No pain   Limitations Walking   How long can you walk comfortably? varies day to day; can walk driveway 3-4 times some days, other days only once; leg fatigue causes him to sit   Patient Stated Goals Walking better; walk without the cane (has used for 3 yrs)   Currently in Pain? No/denies                         [facility] Adult PT Treatment/Exercise - 10/18/16 0944      Transfers   Transfers Sit to Stand;Stand to Sit   Sit to Stand 4: Min guard;4: Min assist;With upper extremity assist   Sit to Stand Details (indicate cue type and reason) each foot on blue air disc for balance  training   Number of Reps 1 set;10 reps     Ambulation/Gait   Ambulation/Gait Yes   Ambulation/Gait Assistance 4: Min guard   Ambulation/Gait Assistance Details vc for heelstrike; much less rt foot drag   Ambulation Distance (Feet) 120 Feet  120, 240   Assistive device None   Gait Pattern Step-through pattern;Decreased arm swing - right;Decreased arm swing - left;Decreased step length - right;Decreased step length - left;Right foot flat;Left foot flat;Shuffle;Trunk flexed;Poor foot clearance - right   Ambulation Surface Level   Stairs Yes   Stairs Assistance 6: Modified independent (Device/Increase time)   Stair Management Technique One rail Right;Step to pattern;Forwards   Number of Stairs 4   Height of Stairs 6   Ramp 5: Supervision   Curb 4: Min assist   Curb Details (indicate cue type and reason) with cane; initial attempt he caught his toe as stepping up with min assist to recover     Posture/Postural Control   Posture/Postural Control Postural limitations   Postural Limitations Rounded Shoulders;Forward head;Increased thoracic kyphosis   Posture Comments vc throughout session     Knee/Hip Exercises: Machines for Strengthening   Total Gym Leg Press 50# x 5; 70# x 10; 80# x 10     Knee/Hip Exercises: Standing   Hip Abduction Stengthening;Both;1 set;5 reps;Knee straight  green band     Knee/Hip Exercises: Sidelying   Hip ABduction Strengthening;Right;Left;10 reps;2 sets   Hip ABduction Limitations 3#                PT Education - 10/18/16 1353    Education provided Yes   Education Details addition to HEP; discussed moving forward with shoe lift/modification to further improve his RLE clearance   Person(s) Educated Patient;Spouse   Methods Explanation;Demonstration;Handout   Comprehension Verbalized understanding;Returned demonstration;Need further instruction          PT Short Term Goals - 10/02/16 1911      PT SHORT TERM GOAL #1   Title Patient/spouse  will demonstrate independence in HEP for balance, ROM, and strengthening. (Target date for all STGs 09/21/16)   Time 4   Period Weeks   Status Achieved     PT SHORT TERM GOAL #2   Title Patient will demonstrate improved LE strength and balance with improved 5 times sit to stand time to <17 seconds.   Baseline 10/02/16  17.5 seconds   Time 4   Period Weeks   Status Partially Met     PT SHORT TERM GOAL #3   Title Patient will improve gait velocity to >= 2.62 ft/sec (safe community ambulator velocity) while demonstrating appropriate use of assistive device.   Baseline 10/02/16 2.66 ft/sec  Time 4   Period Weeks   Status Achieved     PT SHORT TERM GOAL #4   Title Patient will improve FGA score by 3 points from his baseline to indicate a lesser risk of falling.    Baseline 08/28/16 scored 16/30 ; 10/02/16  12/30 (returned after missing several weeks of PT due to illness)   Time 4   Period Weeks   Status Not Met     PT SHORT TERM GOAL #5   Title Patient/spouse will complete education on fall prevention.    Time 4   Period Weeks   Status On-going           PT Long Term Goals - 10/04/16 1031      PT LONG TERM GOAL #1   Title Patient/spouse will demonstrate independence in updated/final HEP for balance, ROM, and strengthening. (Target date for all LTGs 10/23/16)   Time 8   Period Weeks   Status New     PT LONG TERM GOAL #2   Title Patient will improve TUG normal time to <13.5 seconds, indicative of a lesser risk of falling.    Time 8   Period Weeks   Status New     PT LONG TERM GOAL #3   Title Patient will improve gait velocity to >=2.86 ft/sec (10% less than normal speed for healthy 80-89 yr olds, 3.18 ft/sec).   Time 8   Period Weeks   Status New     PT LONG TERM GOAL #4   Title Patient will improve his FGA score by 2 points compared to score at 4 weeks, indicative of a lesser fall risk.   Baseline 10/04/16 16/30   Time 8   Period Weeks   Status New                Plan - 10/18/16 1356    Clinical Impression Statement Session focused on gait training and strengthening. Patient continues with bil hip abductor weakness with Rt worse than left. Scuffing Rt foot less with 1/2" heel lift in his shoe. Will request MD order for shoe modification. Continue to work toward goals.    Rehab Potential Good   Clinical Impairments Affecting Rehab Potential decreased ability to understand written materials (does have a supportive wife that will assist him)   PT Frequency 2x / week   PT Duration 8 weeks   PT Treatment/Interventions ADLs/Self Care Home Management;DME Instruction;Gait training;Stair training;Functional mobility training;Therapeutic activities;Therapeutic exercise;Balance training;Neuromuscular re-education;Patient/family education;Manual techniques;Passive range of motion   PT Next Visit Plan do recert; Check if MD has sent order for shoe modification;  progress posture, LE strengthening, high level balance and gait (with and without cane); compliant/outdoor surfaces, obstacles, cardiovascular endurance (?treadmill vs Nustep)   Consulted and Agree with Plan of Care Patient      Patient will benefit from skilled therapeutic intervention in order to improve the following deficits and impairments:  Abnormal gait, Decreased balance, Decreased knowledge of use of DME, Decreased mobility, Decreased range of motion, Impaired flexibility, Postural dysfunction  Visit Diagnosis: Unsteadiness on feet  Abnormal posture  Other symptoms and signs involving the musculoskeletal system  Other abnormalities of gait and mobility     Problem List Patient Active Problem List   Diagnosis Date Noted  . Urinary frequency 09/12/2016  . Cough 09/12/2016  . Prediabetes 05/01/2016  . Pain in both lower extremities 05/01/2016  . Bilateral hearing loss 05/01/2016  . Migraine 04/26/2015  . Aortic valve replaced 04/21/2015  .  Subacute confusional state  12/14/2014  . Dyspnea 09/16/2013  . Pulmonary infiltrates 07/31/2013  . Encounter for therapeutic drug monitoring 03/20/2013  . CAD (coronary artery disease) 03/13/2013  . Atrial fibrillation, controlled ([place]) 03/13/2013  . Protein-calorie malnutrition, severe ([place]) 02/27/2013  . S/P CABG x 3 02/20/2013  . History of embolectomy 09/22/2012  . Renal insufficiency, mild 09/22/2012  . Syncope 09/15/2012  . Peripheral vascular disease, unspecified ([place]) 07/08/2012  . Mitral stenosis 07/27/2010  . Hyperlipidemia 10/25/2009  . Essential hypertension 10/25/2009  . NEPHROLITHIASIS, HX OF 10/25/2009  . SLEEP DISORDER 11/29/2008  . SNORING 10/14/2008  . APNEA 10/05/2008  . SKIN CANCER, HX OF 10/05/2008  . RESTLESS LEG SYNDROME 09/11/2007  . Allergic rhinitis, cause unspecified 09/11/2007  . GOUT 03/15/2006  . ASTHMA 03/15/2006  . COPD 03/15/2006  . Osteoporosis 03/15/2006  . COLONIC POLYPS, HX OF 03/15/2006    Rexanne Mano, PT 10/18/2016, 2:01 PM  [facility] [address] [place], [place], [address] Phone: [phone]   Fax:  [phone]  Name: REMMINGTON URIETA MRN: [id number] Date of Birth: 05-Feb-1931

## 2016-10-19 ENCOUNTER — Other Ambulatory Visit: Payer: Self-pay | Admitting: *Deleted

## 2016-10-19 ENCOUNTER — Other Ambulatory Visit: Payer: Self-pay | Admitting: Emergency Medicine

## 2016-10-19 MED ORDER — GABAPENTIN 100 MG PO CAPS: ORAL_CAPSULE | ORAL | 3 refills | Status: DC

## 2016-10-19 MED ORDER — BUSPIRONE HCL 10 MG PO TABS: 20.0000 mg | ORAL_TABLET | Freq: Two times a day (BID) | ORAL | 0 refills | Status: DC

## 2016-10-23 ENCOUNTER — Ambulatory Visit: Payer: Medicare Other | Admitting: Physical Therapy

## 2016-10-24 ENCOUNTER — Telehealth: Payer: Self-pay | Admitting: Physical Therapy

## 2016-10-24 ENCOUNTER — Encounter: Payer: Self-pay | Admitting: Physical Therapy

## 2016-10-24 ENCOUNTER — Ambulatory Visit: Payer: Medicare Other | Attending: Internal Medicine | Admitting: Physical Therapy

## 2016-10-24 DIAGNOSIS — H90A31 Mixed conductive and sensorineural hearing loss, unilateral, right ear with restricted hearing on the contralateral side: Secondary | ICD-10-CM | POA: Insufficient documentation

## 2016-10-24 DIAGNOSIS — H93299 Other abnormal auditory perceptions, unspecified ear: Secondary | ICD-10-CM | POA: Insufficient documentation

## 2016-10-24 DIAGNOSIS — R293 Abnormal posture: Secondary | ICD-10-CM

## 2016-10-24 DIAGNOSIS — R29898 Other symptoms and signs involving the musculoskeletal system: Secondary | ICD-10-CM | POA: Diagnosis not present

## 2016-10-24 DIAGNOSIS — R2689 Other abnormalities of gait and mobility: Secondary | ICD-10-CM | POA: Diagnosis not present

## 2016-10-24 DIAGNOSIS — H9312 Tinnitus, left ear: Secondary | ICD-10-CM | POA: Insufficient documentation

## 2016-10-24 DIAGNOSIS — R94120 Abnormal auditory function study: Secondary | ICD-10-CM | POA: Diagnosis not present

## 2016-10-24 DIAGNOSIS — R2681 Unsteadiness on feet: Secondary | ICD-10-CM | POA: Diagnosis not present

## 2016-10-24 DIAGNOSIS — H90A22 Sensorineural hearing loss, unilateral, left ear, with restricted hearing on the contralateral side: Secondary | ICD-10-CM | POA: Insufficient documentation

## 2016-10-24 DIAGNOSIS — H93213 Auditory recruitment, bilateral: Secondary | ICD-10-CM | POA: Insufficient documentation

## 2016-10-24 NOTE — Therapy (Signed)
[facility] [address] [place], [place], [address] Phone: [phone]   Fax:  [phone]  Physical Therapy Treatment  Patient Details  Name: Robert Richard MRN: [id number] Date of Birth: 26-Dec-1930 Referring Provider: Dorris Carnes  Encounter Date: 10/24/2016      PT End of Session - 10/24/16 1200    Visit Number 12   Number of Visits 17   Date for PT Re-Evaluation 87/57/97  recert 03/29/18   Authorization Type UHC Medicare  G-code every 10th visit   Authorization Time Period 08/24/16 to 10/23/16; 10/23/16 to 12/23/16   PT Start Time 0934   PT Stop Time 1017   PT Time Calculation (min) 43 min   Equipment Utilized During Treatment Gait belt  used pt's leather belt   Activity Tolerance Patient tolerated treatment well   Behavior During Therapy [facility] for tasks assessed/performed      Past Medical History:  Diagnosis Date  . Allergic rhinitis   . Anemia   . Arthritis    SHOULDER  . Asthma with bronchitis   . Colon polyp   . COPD (chronic obstructive pulmonary disease) (HCC)    Astmatic bronchitis  . Dysrhythmia 08/2012   NEW ONSET ATRIAL FIBRILATION  . Gout    PMH  . Headache(784.0)   . HLD (hyperlipidemia)   . Nephrolithiasis   . Osteoporosis   . RLS (restless legs syndrome)   . Skin cancer (melanoma) ([place]) 2012   Right neck  . Skin cancer, basal cell    Dr Nevada Crane, [facility]    Past Surgical History:  Procedure Laterality Date  . AORTIC VALVE REPLACEMENT N/A 02/20/2013   Procedure: AORTIC VALVE REPLACEMENT (AVR);  Surgeon: Ivin Poot, MD;  Location: [facility];  Service: Open Heart Surgery;  Laterality: N/A;  . COLONOSCOPY W/ POLYPECTOMY     Dr Deatra Ina  . CORONARY ARTERY BYPASS GRAFT N/A 02/20/2013   Procedure: CORONARY ARTERY BYPASS GRAFTING (CABG);  Surgeon: Ivin Poot, MD;  Location: [facility];  Service: Open Heart Surgery;  Laterality: N/A;  . FINGER SURGERY     for foreign body  . INGUINAL HERNIA REPAIR     . INTRAOPERATIVE TRANSESOPHAGEAL ECHOCARDIOGRAM N/A 02/20/2013   Procedure: INTRAOPERATIVE TRANSESOPHAGEAL ECHOCARDIOGRAM;  Surgeon: Ivin Poot, MD;  Location: [facility];  Service: Open Heart Surgery;  Laterality: N/A;  . LEFT AND RIGHT HEART CATHETERIZATION WITH CORONARY ANGIOGRAM N/A 01/05/2013   Procedure: LEFT AND RIGHT HEART CATHETERIZATION WITH CORONARY ANGIOGRAM;  Surgeon: Blane Ohara, MD;  Location: [facility] CATH LAB;  Service: Cardiovascular;  Laterality: N/A;  . LEG SURGERY  06/2010    Dr.Lawson, for blood clott. Left leg   . NECK SURGERY  12/2010   Melanoma ; [facility]   . NOSE SURGERY     Septal Deviation  . RIGHT HEART CATHETERIZATION N/A 01/23/2013   Procedure: RIGHT HEART CATH;  Surgeon: Jolaine Artist, MD;  [facility] C[facility] LAB;  Service: Cardiovascular;  Laterality: N/A;  . TEE WITHOUT CARDIOVERSION N/A 01/23/2013   Procedure: TRANSESOPHAGEAL ECHOCARDIOGRAM (TEE);  Surgeon: Jolaine Artist, MD;  Location: [facility] ENDOSCOPY;  Service: Cardiovascular;  Laterality: N/A;  sign heart cath consent w/tee consent    There were no vitals filed for this visit.      Subjective Assessment - 10/24/16 0938    Subjective (P)  No changes. Was sore from using the theraband for LE exercises x 1 day.    Currently in Pain? (P)  No/denies  [facility] Adult PT Treatment/Exercise - 10/24/16 0955      Ambulation/Gait   Ambulation/Gait Yes   Ambulation/Gait Assistance 4: Min guard;5: Supervision   Ambulation/Gait Assistance Details nearly constant verbal and tactile cues for upright posture, longer step length   Ambulation Distance (Feet) 100 Feet  120, 120   Assistive device None   Gait Pattern Step-through pattern;Decreased arm swing - right;Decreased arm swing - left;Decreased step length - right;Decreased step length - left;Right foot flat;Left foot flat;Shuffle;Trunk flexed;Poor foot clearance - right   Ambulation Surface Level      Posture/Postural Control   Posture/Postural Control Postural limitations   Postural Limitations Rounded Shoulders;Forward head;Increased thoracic kyphosis   Posture Comments able to achieve upright posture with only slight forward head with max cues; repeated anterior chest/spine stretch in doorway and supine (with towel roll down center spine);      Lumbar Exercises: Stretches   Lower Trunk Rotation 2 reps;30 seconds   Prone on Elbows Stretch Limitations pt unable to get on his stomach due to lt shoulder injury/limitations   Quad Stretch 1 rep;60 seconds   Quad Stretch Limitations with hip flexor stretch with leg off side of bed     Lumbar Exercises: Supine   Bridge 10 reps;5 seconds             Balance Exercises - 10/24/16 0956      Balance Exercises: Standing   Tandem Gait Forward;Intermittent upper extremity support;Foam/compliant surface;4 reps  solid floor and then red mat   Retro Gait Foam/compliant surface;4 reps  red mat   Sidestepping Foam/compliant support;4 reps  4 reps regular; 2 reps knees slightly bent           PT Education - 10/24/16 1424    Education provided Yes   Education Details see addition to HEP x 10 reps x 5 second hold   Person(s) Educated Patient   Methods Explanation;Demonstration;Tactile cues;Verbal cues;Handout   Comprehension Verbalized understanding;Returned demonstration;Verbal cues required;Tactile cues required;Need further instruction          PT Short Term Goals - 10/02/16 1911      PT SHORT TERM GOAL #1   Title Patient/spouse will demonstrate independence in HEP for balance, ROM, and strengthening. (Target date for all STGs 09/21/16)   Time 4   Period Weeks   Status Achieved     PT SHORT TERM GOAL #2   Title Patient will demonstrate improved LE strength and balance with improved 5 times sit to stand time to <17 seconds.   Baseline 10/02/16  17.5 seconds   Time 4   Period Weeks   Status Partially Met     PT SHORT TERM  GOAL #3   Title Patient will improve gait velocity to >= 2.62 ft/sec (safe community ambulator velocity) while demonstrating appropriate use of assistive device.   Baseline 10/02/16 2.66 ft/sec   Time 4   Period Weeks   Status Achieved     PT SHORT TERM GOAL #4   Title Patient will improve FGA score by 3 points from his baseline to indicate a lesser risk of falling.    Baseline 08/28/16 scored 16/30 ; 10/02/16  12/30 (returned after missing several weeks of PT due to illness)   Time 4   Period Weeks   Status Not Met     PT SHORT TERM GOAL #5   Title Patient/spouse will complete education on fall prevention.    Time 4   Period Weeks   Status On-going  PT Long Term Goals - 10/24/16 1438      PT LONG TERM GOAL #1   Title Patient/spouse will demonstrate independence in updated/final HEP for balance, ROM, and strengthening. (Target date for all LTGs 10/23/16--updated to 11/16/16 due to missed 3 weeks)   Time 8   Period Weeks   Status New     PT LONG TERM GOAL #2   Title Patient will improve TUG normal time to <13.5 seconds, indicative of a lesser risk of falling.    Time 8   Period Weeks   Status New     PT LONG TERM GOAL #3   Title Patient will improve gait velocity to >=2.86 ft/sec (10% less than normal speed for healthy 80-89 yr olds, 3.18 ft/sec).   Time 8   Period Weeks   Status New     PT LONG TERM GOAL #4   Title Patient will improve his FGA score by 2 points compared to score at 4 weeks, indicative of a lesser fall risk.   Baseline 10/04/16 16/30   Time 8   Period Weeks   Status New               Plan - 10/24/16 1428    Clinical Impression Statement Patient missed 3 consecutive weeks of PT due to illness, therefore LTGs not assessed today. Plan to re-certify for the 3 weeks he missed and then assess progress to LTGs. Today's session focused on balance and posture. Patient continues with overall weakness (especially back extensors) and is slowly  making progress towards goals.    Rehab Potential Good   Clinical Impairments Affecting Rehab Potential decreased ability to understand written materials (does have a supportive wife that will assist him)   PT Frequency 2x / week   PT Duration 8 weeks   PT Treatment/Interventions ADLs/Self Care Home Management;DME Instruction;Gait training;Stair training;Functional mobility training;Therapeutic activities;Therapeutic exercise;Balance training;Neuromuscular re-education;Patient/family education;Manual techniques;Passive range of motion   PT Next Visit Plan Check if MD has sent order for shoe modification;  progress posture, LE strengthening, high level balance and gait (with and without cane); compliant/outdoor surfaces, obstacles, cardiovascular endurance (?treadmill vs Nustep)   Consulted and Agree with Plan of Care Patient      Patient will benefit from skilled therapeutic intervention in order to improve the following deficits and impairments:  Abnormal gait, Decreased balance, Decreased knowledge of use of DME, Decreased mobility, Decreased range of motion, Impaired flexibility, Postural dysfunction  Visit Diagnosis: Unsteadiness on feet - Plan: PT plan of care cert/re-cert  Abnormal posture - Plan: PT plan of care cert/re-cert  Other symptoms and signs involving the musculoskeletal system - Plan: PT plan of care cert/re-cert  Other abnormalities of gait and mobility - Plan: PT plan of care cert/re-cert     Problem List Patient Active Problem List   Diagnosis Date Noted  . Urinary frequency 09/12/2016  . Cough 09/12/2016  . Prediabetes 05/01/2016  . Pain in both lower extremities 05/01/2016  . Bilateral hearing loss 05/01/2016  . Migraine 04/26/2015  . Aortic valve replaced 04/21/2015  . Subacute confusional state 12/14/2014  . Dyspnea 09/16/2013  . Pulmonary infiltrates 07/31/2013  . Encounter for therapeutic drug monitoring 03/20/2013  . CAD (coronary artery disease)  03/13/2013  . Atrial fibrillation, controlled ([place]) 03/13/2013  . Protein-calorie malnutrition, severe ([place]) 02/27/2013  . S/P CABG x 3 02/20/2013  . History of embolectomy 09/22/2012  . Renal insufficiency, mild 09/22/2012  . Syncope 09/15/2012  . Peripheral vascular disease, unspecified (  [place]) 07/08/2012  . Mitral stenosis 07/27/2010  . Hyperlipidemia 10/25/2009  . Essential hypertension 10/25/2009  . NEPHROLITHIASIS, HX OF 10/25/2009  . SLEEP DISORDER 11/29/2008  . SNORING 10/14/2008  . APNEA 10/05/2008  . SKIN CANCER, HX OF 10/05/2008  . RESTLESS LEG SYNDROME 09/11/2007  . Allergic rhinitis, cause unspecified 09/11/2007  . GOUT 03/15/2006  . ASTHMA 03/15/2006  . COPD 03/15/2006  . Osteoporosis 03/15/2006  . COLONIC POLYPS, HX OF 03/15/2006    Rexanne Mano, PT 10/24/2016, 2:39 PM  [facility] [address] [place], [place], [address] Phone: [phone]   Fax:  [phone]  Name: Robert Richard MRN: [id number] Date of Birth: [birth year]

## 2016-10-24 NOTE — Telephone Encounter (Signed)
Dr. Harrington Challenger,  Thank you for referring Isacc Turney to Physical Therapy. He was evaluated by Physical Therapy on 08/24/16.  The patient would benefit from an orthotist evaluation for shoe modification due to leg length discrepancy of 7/8 inch (left leg shorter than right). I feel this will improve his balance and gait. Patient and wife are in agreement with this plan.                    If you agree, please place an order in [facility] workque in [facility] or fax the order to [phone].  Thank you,   Barry Brunner, PT [facility]patient [facility] [address] [place], [place] [address] [phone]

## 2016-10-24 NOTE — Telephone Encounter (Signed)
Please confirm that order was placed

## 2016-10-24 NOTE — Patient Instructions (Signed)
Shoulder Blade Squeeze    Rotate shoulders back, then squeeze shoulder blades together. Hold for 30 seconds Repeat __5__ times. Do __2__ sessions per day. (and every time you find yourself slouching)  http://gt2.exer.us/846   Copyright  VHI. All rights reserved.

## 2016-10-25 NOTE — Telephone Encounter (Signed)
Will fax script at this time to [facility], attn: Barry Brunner, [place]. [phone]

## 2016-10-26 ENCOUNTER — Encounter: Payer: Self-pay | Admitting: Physical Therapy

## 2016-10-26 ENCOUNTER — Ambulatory Visit: Payer: Medicare Other | Admitting: Physical Therapy

## 2016-10-26 DIAGNOSIS — H9312 Tinnitus, left ear: Secondary | ICD-10-CM | POA: Diagnosis not present

## 2016-10-26 DIAGNOSIS — R2681 Unsteadiness on feet: Secondary | ICD-10-CM

## 2016-10-26 DIAGNOSIS — R29898 Other symptoms and signs involving the musculoskeletal system: Secondary | ICD-10-CM | POA: Diagnosis not present

## 2016-10-26 DIAGNOSIS — H90A22 Sensorineural hearing loss, unilateral, left ear, with restricted hearing on the contralateral side: Secondary | ICD-10-CM | POA: Diagnosis not present

## 2016-10-26 DIAGNOSIS — H93213 Auditory recruitment, bilateral: Secondary | ICD-10-CM | POA: Diagnosis not present

## 2016-10-26 DIAGNOSIS — R94120 Abnormal auditory function study: Secondary | ICD-10-CM | POA: Diagnosis not present

## 2016-10-26 DIAGNOSIS — R293 Abnormal posture: Secondary | ICD-10-CM

## 2016-10-26 DIAGNOSIS — R2689 Other abnormalities of gait and mobility: Secondary | ICD-10-CM | POA: Diagnosis not present

## 2016-10-26 DIAGNOSIS — H93299 Other abnormal auditory perceptions, unspecified ear: Secondary | ICD-10-CM | POA: Diagnosis not present

## 2016-10-26 DIAGNOSIS — H90A31 Mixed conductive and sensorineural hearing loss, unilateral, right ear with restricted hearing on the contralateral side: Secondary | ICD-10-CM | POA: Diagnosis not present

## 2016-10-26 NOTE — Therapy (Signed)
[facility] [address] [place], [place], [address] Phone: [phone]   Fax:  [phone]  Physical Therapy Treatment  Patient Details  Name: Robert Richard MRN: [id number] Date of Birth: 1930-08-30 Referring Provider: Dorris Carnes  Encounter Date: 10/26/2016      PT End of Session - 10/26/16 2232    Visit Number 13   Number of Visits 17   Date for PT Re-Evaluation 02/72/53  recert 07/26/42   Authorization Type [employer] Medicare  G-code every 10th visit   Authorization Time Period 08/24/16 to 10/23/16; 10/23/16 to 12/23/16   PT Start Time 1531   PT Stop Time 1615   PT Time Calculation (min) 44 min   Equipment Utilized During Treatment Gait belt  used pt's leather belt   Activity Tolerance Patient tolerated treatment well   Behavior During Therapy WFL for tasks assessed/performed      Past Medical History:  Diagnosis Date  . Allergic rhinitis   . Anemia   . Arthritis    SHOULDER  . Asthma with bronchitis   . Colon polyp   . COPD (chronic obstructive pulmonary disease) (HCC)    Astmatic bronchitis  . Dysrhythmia 08/2012   NEW ONSET ATRIAL FIBRILATION  . Gout    PMH  . Headache(784.0)   . HLD (hyperlipidemia)   . Nephrolithiasis   . Osteoporosis   . RLS (restless legs syndrome)   . Skin cancer (melanoma) ([place]) 2012   Right neck  . Skin cancer, basal cell    Dr Nevada Crane, [facility]    Past Surgical History:  Procedure Laterality Date  . AORTIC VALVE REPLACEMENT N/A 02/20/2013   Procedure: AORTIC VALVE REPLACEMENT (AVR);  Surgeon: Ivin Poot, MD;  Location: [facility];  Service: Open Heart Surgery;  Laterality: N/A;  . COLONOSCOPY W/ POLYPECTOMY     Dr Deatra Ina  . CORONARY ARTERY BYPASS GRAFT N/A 02/20/2013   Procedure: CORONARY ARTERY BYPASS GRAFTING (CABG);  Surgeon: Ivin Poot, MD;  Location: [facility];  Service: Open Heart Surgery;  Laterality: N/A;  . FINGER SURGERY     for foreign body  . INGUINAL HERNIA REPAIR     . INTRAOPERATIVE TRANSESOPHAGEAL ECHOCARDIOGRAM N/A 02/20/2013   Procedure: INTRAOPERATIVE TRANSESOPHAGEAL ECHOCARDIOGRAM;  Surgeon: Ivin Poot, MD;  Location: [place] [facility];  Service: Open Heart Surgery;  Laterality: N/A;  . LEFT AND RIGHT HEART CATHETERIZATION WITH CORONARY ANGIOGRAM N/A 01/05/2013   Procedure: LEFT AND RIGHT HEART CATHETERIZATION WITH CORONARY ANGIOGRAM;  Surgeon: Blane Ohara, MD;  Location: [facility] CATH LAB;  Service: Cardiovascular;  Laterality: N/A;  . LEG SURGERY  06/2010    Dr.Lawson, for blood clott. Left leg   . NECK SURGERY  12/2010   Melanoma ; [facility]   . NOSE SURGERY     Septal Deviation  . RIGHT HEART CATHETERIZATION N/A 01/23/2013   Procedure: RIGHT HEART CATH;  Surgeon: Jolaine Artist, MD;  Location: [facility] CATH LAB;  Service: Cardiovascular;  Laterality: N/A;  . TEE WITHOUT CARDIOVERSION N/A 01/23/2013   Procedure: TRANSESOPHAGEAL ECHOCARDIOGRAM (TEE);  Surgeon: Jolaine Artist, MD;  Location: [facility] ENDOSCOPY;  Service: Cardiovascular;  Laterality: N/A;  sign heart cath consent w/tee consent    There were no vitals filed for this visit.      Subjective Assessment - 10/26/16 1539    Subjective No changes.    Patient is accompained by: Family member  wife   Limitations Walking   How long can you walk comfortably?  varies day to day; can walk driveway 3-4 times some days, other days only once; leg fatigue causes him to sit   Patient Stated Goals Walking better; walk without the cane (has used for 3 yrs)   Currently in Pain? No/denies                         [facility] Adult PT Treatment/Exercise - 10/26/16 0001      Ambulation/Gait   Ambulation/Gait Yes   Ambulation/Gait Assistance 4: Min guard;5: Supervision   Ambulation/Gait Assistance Details max cues for upright posture, step length and heelstrike   Ambulation Distance (Feet) 100 Feet  120 x 3   Assistive device None   Gait Pattern Step-through pattern;Decreased arm swing -  right;Decreased arm swing - left;Decreased step length - right;Decreased step length - left;Right foot flat;Left foot flat;Shuffle;Trunk flexed;Poor foot clearance - right   Ambulation Surface Level   Ramp 5: Supervision   Curb 5: Supervision   Curb Details (indicate cue type and reason) no device; x3      Posture/Postural Control   Posture/Postural Control Postural limitations   Postural Limitations Rounded Shoulders;Forward head;Increased thoracic kyphosis   Posture Comments able to achieve upright posture with only slight forward head with max cues; repeated anterior chest/spine stretch in doorway and supine (with towel roll down center spine);             Balance Exercises - 10/26/16 2225      Balance Exercises: Standing   Tandem Stance Eyes open;Intermittent upper extremity support;4 reps;10 secs   SLS with Vectors Foam/compliant surface;Upper extremity assist 1  on red mat; tip cones over and up    Stepping Strategy Anterior;Posterior;Foam/compliant surface;10 reps   Step Ups Forward;4 inch;UE support 1  and backward   Balance Beam black beam; single UE support   Tandem Gait Forward;Intermittent upper extremity support;3 reps   Retro Gait Foam/compliant surface   Sidestepping Foam/compliant support   Step Over Hurdles / Cones 6" x3 x 4           PT Education - 10/26/16 2231    Education provided Yes   Education Details provided MD script for shoe modifications with name, phone #, and address of 2 local P&O   Person(s) Educated Patient;Spouse   Methods Explanation;Handout   Comprehension Verbalized understanding          PT Short Term Goals - 10/02/16 1911      PT SHORT TERM GOAL #1   Title Patient/spouse will demonstrate independence in HEP for balance, ROM, and strengthening. (Target date for all STGs 09/21/16)   Time 4   Period Weeks   Status Achieved     PT SHORT TERM GOAL #2   Title Patient will demonstrate improved LE strength and balance with  improved 5 times sit to stand time to <17 seconds.   Baseline 10/02/16  17.5 seconds   Time 4   Period Weeks   Status Partially Met     PT SHORT TERM GOAL #3   Title Patient will improve gait velocity to >= 2.62 ft/sec (safe community ambulator velocity) while demonstrating appropriate use of assistive device.   Baseline 10/02/16 2.66 ft/sec   Time 4   Period Weeks   Status Achieved     PT SHORT TERM GOAL #4   Title Patient will improve FGA score by 3 points from his baseline to indicate a lesser risk of falling.    Baseline 08/28/16 scored 16/30 ;  10/02/16  12/30 (returned after missing several weeks of PT due to illness)   Time 4   Period Weeks   Status Not Met     PT SHORT TERM GOAL #5   Title Patient/spouse will complete education on fall prevention.    Time 4   Period Weeks   Status On-going           PT Long Term Goals - 10/24/16 1438      PT LONG TERM GOAL #1   Title Patient/spouse will demonstrate independence in updated/final HEP for balance, ROM, and strengthening. (Target date for all LTGs 10/23/16--updated to 11/16/16 due to missed 3 weeks)   Time 8   Period Weeks   Status New     PT LONG TERM GOAL #2   Title Patient will improve TUG normal time to <13.5 seconds, indicative of a lesser risk of falling.    Time 8   Period Weeks   Status New     PT LONG TERM GOAL #3   Title Patient will improve gait velocity to >=2.86 ft/sec (10% less than normal speed for healthy 80-89 yr olds, 3.18 ft/sec).   Time 8   Period Weeks   Status New     PT LONG TERM GOAL #4   Title Patient will improve his FGA score by 2 points compared to score at 4 weeks, indicative of a lesser fall risk.   Baseline 10/04/16 16/30   Time 8   Period Weeks   Status New               Plan - 10/26/16 2234    Clinical Impression Statement Session focused on balance, gait, and strength training. Patient beginning to self-correct his posture, however continues to require frequent cues.  Patient is progressing toward his goals    Rehab Potential Good   Clinical Impairments Affecting Rehab Potential decreased ability to understand written materials (does have a supportive wife that will assist him)   PT Frequency 2x / week   PT Duration 8 weeks   PT Treatment/Interventions ADLs/Self Care Home Management;DME Instruction;Gait training;Stair training;Functional mobility training;Therapeutic activities;Therapeutic exercise;Balance training;Neuromuscular re-education;Patient/family education;Manual techniques;Passive range of motion   PT Next Visit Plan progress posture, LE strengthening, high level balance and gait (without cane); compliant/outdoor surfaces, obstacles, cardiovascular endurance (?treadmill vs Nustep)   Consulted and Agree with Plan of Care Patient      Patient will benefit from skilled therapeutic intervention in order to improve the following deficits and impairments:  Abnormal gait, Decreased balance, Decreased knowledge of use of DME, Decreased mobility, Decreased range of motion, Impaired flexibility, Postural dysfunction  Visit Diagnosis: Unsteadiness on feet  Abnormal posture  Other symptoms and signs involving the musculoskeletal system  Other abnormalities of gait and mobility     Problem List Patient Active Problem List   Diagnosis Date Noted  . Urinary frequency 09/12/2016  . Cough 09/12/2016  . Prediabetes 05/01/2016  . Pain in both lower extremities 05/01/2016  . Bilateral hearing loss 05/01/2016  . Migraine 04/26/2015  . Aortic valve replaced 04/21/2015  . Subacute confusional state 12/14/2014  . Dyspnea 09/16/2013  . Pulmonary infiltrates 07/31/2013  . Encounter for therapeutic drug monitoring 03/20/2013  . CAD (coronary artery disease) 03/13/2013  . Atrial fibrillation, controlled ([place]) 03/13/2013  . Protein-calorie malnutrition, severe ([place]) 02/27/2013  . S/P CABG x 3 02/20/2013  . History of embolectomy 09/22/2012  . Renal  insufficiency, mild 09/22/2012  . Syncope 09/15/2012  . Peripheral vascular  disease, unspecified ([place]) 07/08/2012  . Mitral stenosis 07/27/2010  . Hyperlipidemia 10/25/2009  . Essential hypertension 10/25/2009  . NEPHROLITHIASIS, HX OF 10/25/2009  . SLEEP DISORDER 11/29/2008  . SNORING 10/14/2008  . APNEA 10/05/2008  . SKIN CANCER, HX OF 10/05/2008  . RESTLESS LEG SYNDROME 09/11/2007  . Allergic rhinitis, cause unspecified 09/11/2007  . GOUT 03/15/2006  . ASTHMA 03/15/2006  . COPD 03/15/2006  . Osteoporosis 03/15/2006  . COLONIC POLYPS, HX OF 03/15/2006    Rexanne Mano, PT 10/26/2016, 10:39 PM  [facility] [address] [place], [place], [address] Phone: [phone]   Fax:  [phone]  Name: YGNACIO FECTEAU MRN: [id number] Date of Birth: [birth year]

## 2016-10-29 ENCOUNTER — Ambulatory Visit: Payer: Medicare Other | Admitting: Physical Therapy

## 2016-10-30 ENCOUNTER — Ambulatory Visit: Payer: Medicare Other | Admitting: Physical Therapy

## 2016-10-31 ENCOUNTER — Encounter: Payer: Self-pay | Admitting: Physical Therapy

## 2016-10-31 ENCOUNTER — Ambulatory Visit: Payer: Medicare Other | Admitting: Physical Therapy

## 2016-10-31 DIAGNOSIS — R2689 Other abnormalities of gait and mobility: Secondary | ICD-10-CM

## 2016-10-31 DIAGNOSIS — H93299 Other abnormal auditory perceptions, unspecified ear: Secondary | ICD-10-CM | POA: Diagnosis not present

## 2016-10-31 DIAGNOSIS — R29898 Other symptoms and signs involving the musculoskeletal system: Secondary | ICD-10-CM

## 2016-10-31 DIAGNOSIS — H93213 Auditory recruitment, bilateral: Secondary | ICD-10-CM | POA: Diagnosis not present

## 2016-10-31 DIAGNOSIS — R293 Abnormal posture: Secondary | ICD-10-CM | POA: Diagnosis not present

## 2016-10-31 DIAGNOSIS — H90A31 Mixed conductive and sensorineural hearing loss, unilateral, right ear with restricted hearing on the contralateral side: Secondary | ICD-10-CM | POA: Diagnosis not present

## 2016-10-31 DIAGNOSIS — R94120 Abnormal auditory function study: Secondary | ICD-10-CM | POA: Diagnosis not present

## 2016-10-31 DIAGNOSIS — H90A22 Sensorineural hearing loss, unilateral, left ear, with restricted hearing on the contralateral side: Secondary | ICD-10-CM | POA: Diagnosis not present

## 2016-10-31 DIAGNOSIS — H9312 Tinnitus, left ear: Secondary | ICD-10-CM | POA: Diagnosis not present

## 2016-10-31 DIAGNOSIS — R2681 Unsteadiness on feet: Secondary | ICD-10-CM

## 2016-11-01 ENCOUNTER — Ambulatory Visit: Payer: Medicare Other | Admitting: Physical Therapy

## 2016-11-01 NOTE — Therapy (Signed)
[facility] [address] [place], [place], [address] Phone: [phone]   Fax:  [phone]  Physical Therapy Treatment  Patient Details  Name: Robert Richard MRN: [id number] Date of Birth: 06-08-1930 Referring Provider: Dorris Carnes  Encounter Date: 10/31/2016      PT End of Session - 10/31/16 0935    Visit Number 14   Number of Visits 17   Date for PT Re-Evaluation 78/67/54  recert 05/29/18   Authorization Type UHC Medicare  G-code every 10th visit   Authorization Time Period 08/24/16 to 10/23/16; 10/23/16 to 12/23/16   PT Start Time 0933   PT Stop Time 1015   PT Time Calculation (min) 42 min   Equipment Utilized During Treatment Gait belt    Activity Tolerance Patient tolerated treatment well   Behavior During Therapy [facility] for tasks assessed/performed      Past Medical History:  Diagnosis Date  . Allergic rhinitis   . Anemia   . Arthritis    SHOULDER  . Asthma with bronchitis   . Colon polyp   . COPD (chronic obstructive pulmonary disease) (HCC)    Astmatic bronchitis  . Dysrhythmia 08/2012   NEW ONSET ATRIAL FIBRILATION  . Gout    PMH  . Headache(784.0)   . HLD (hyperlipidemia)   . Nephrolithiasis   . Osteoporosis   . RLS (restless legs syndrome)   . Skin cancer (melanoma) ([place]) 2012   Right neck  . Skin cancer, basal cell    Dr Nevada Crane, [facility]    Past Surgical History:  Procedure Laterality Date  . AORTIC VALVE REPLACEMENT N/A 02/20/2013   Procedure: AORTIC VALVE REPLACEMENT (AVR);  Surgeon: Ivin Poot, MD;  Location: [facility];  Service: Open Heart Surgery;  Laterality: N/A;  . COLONOSCOPY W/ POLYPECTOMY     Dr Deatra Ina  . CORONARY ARTERY BYPASS GRAFT N/A 02/20/2013   Procedure: CORONARY ARTERY BYPASS GRAFTING (CABG);  Surgeon: Ivin Poot, MD;  Location: [facility];  Service: Open Heart Surgery;  Laterality: N/A;  . FINGER SURGERY     for foreign body  . INGUINAL HERNIA REPAIR    . INTRAOPERATIVE  TRANSESOPHAGEAL ECHOCARDIOGRAM N/A 02/20/2013   Procedure: INTRAOPERATIVE TRANSESOPHAGEAL ECHOCARDIOGRAM;  Surgeon: Ivin Poot, MD;  Location: [place];  Service: Open Heart Surgery;  Laterality: N/A;  . LEFT AND RIGHT HEART CATHETERIZATION WITH CORONARY ANGIOGRAM N/A 01/05/2013   Procedure: LEFT AND RIGHT HEART CATHETERIZATION WITH CORONARY ANGIOGRAM;  Surgeon: Blane Ohara, MD;  Location[facility] CATH LAB;  Service: Cardiovascular;  Laterality: N/A;  . LEG SURGERY  06/2010    Dr.Lawson, for blood clott. Left leg   . NECK SURGERY  12/2010   Melanoma ; [facility]   . NOSE SURGERY     Septal Deviation  . RIGHT HEART CATHETERIZATION N/A 01/23/2013   Procedure: RIGHT HEART CATH;  Surgeon: Jolaine Artist, MD;  Location: [facility] CATH LAB;  Service: Cardiovascular;  Laterality: N/A;  . TEE WITHOUT CARDIOVERSION N/A 01/23/2013   Procedure: TRANSESOPHAGEAL ECHOCARDIOGRAM (TEE);  Surgeon: Jolaine Artist, MD;  Location: [facility] ENDOSCOPY;  Service: Cardiovascular;  Laterality: N/A;  sign heart cath consent w/tee consent    There were no vitals filed for this visit.      Subjective Assessment - 10/31/16 0934    Subjective No new complaitns. No falls or pain to report.   Patient is accompained by: Family member  spouse in lobby   Limitations Walking   How long  can you walk comfortably? varies day to day; can walk driveway 3-4 times some days, other days only once; leg fatigue causes him to sit   Patient Stated Goals Walking better; walk without the cane (has used for 3 yrs)   Currently in Pain? No/denies            [facility] Adult PT Treatment/Exercise - 10/31/16 0938      Transfers   Transfers Sit to Stand;Stand to Sit   Sit to Stand 5: Supervision;With upper extremity assist;From bed;From chair/3-in-1   Stand to Sit 5: Supervision;With upper extremity assist;To bed;To chair/3-in-1     Ambulation/Gait   Ambulation/Gait Yes   Ambulation/Gait Assistance 4: Min guard   Ambulation Distance  (Feet) 115 Feet   Assistive device None   Gait Pattern Step-through pattern;Decreased arm swing - right;Decreased arm swing - left;Decreased step length - right;Decreased step length - left;Right foot flat;Left foot flat;Shuffle;Trunk flexed;Poor foot clearance - right   Ambulation Surface Level;Indoor   Pre-Gait Activities use of ladder with flat white slats: reciprocal stepping x 6 laps with min guard to min assist, cues for step length to clear slats and for heel toe step progression.     Neuro Re-ed    Neuro Re-ed Details  staning on air ex in corner with chair in front for safety: using red theraband for shoulder horizontal abduction, alternating diagonals, rows and shoulder extension x 10 reps each with min guard to min assist for safety. multimodal cues with facilitation for tall posture with ex's; standing across blue foam beam: alternating heel taps fwd to floor/back onto beam, followed by alternating bwd toe taps/back onto foam beam. 8-10 reps each bil legs with mod/max assist/facilitation for posture. cues on posture, weight shifting and ex technique.                    Exercises   Other Exercises  supine on mat with towel roll along spine, arms to side for chest stretch x 2 minutes; seated on large blue pball: bouncing x 1 minute with emphasis on tall posture, alternating UE raises, scapular retractions and X<>Y x 10 reps each. cues on posture and ex form provided.             PT Short Term Goals - 10/02/16 1911      PT SHORT TERM GOAL #1   Title Patient/spouse will demonstrate independence in HEP for balance, ROM, and strengthening. (Target date for all STGs 09/21/16)   Time 4   Period Weeks   Status Achieved     PT SHORT TERM GOAL #2   Title Patient will demonstrate improved LE strength and balance with improved 5 times sit to stand time to <17 seconds.   Baseline 10/02/16  17.5 seconds   Time 4   Period Weeks   Status Partially Met     PT SHORT TERM GOAL #3   Title  Patient will improve gait velocity to >= 2.62 ft/sec (safe community ambulator velocity) while demonstrating appropriate use of assistive device.   Baseline 10/02/16 2.66 ft/sec   Time 4   Period Weeks   Status Achieved     PT SHORT TERM GOAL #4   Title Patient will improve FGA score by 3 points from his baseline to indicate a lesser risk of falling.    Baseline 08/28/16 scored 16/30 ; 10/02/16  12/30 (returned after missing several weeks of PT due to illness)   Time 4   Period Weeks  Status Not Met     PT SHORT TERM GOAL #5   Title Patient/spouse will complete education on fall prevention.    Time 4   Period Weeks   Status On-going           PT Long Term Goals - 10/24/16 1438      PT LONG TERM GOAL #1   Title Patient/spouse will demonstrate independence in updated/final HEP for balance, ROM, and strengthening. (Target date for all LTGs 10/23/16--updated to 11/16/16 due to missed 3 weeks)   Time 8   Period Weeks   Status New     PT LONG TERM GOAL #2   Title Patient will improve TUG normal time to <13.5 seconds, indicative of a lesser risk of falling.    Time 8   Period Weeks   Status New     PT LONG TERM GOAL #3   Title Patient will improve gait velocity to >=2.86 ft/sec (10% less than normal speed for healthy 80-89 yr olds, 3.18 ft/sec).   Time 8   Period Weeks   Status New     PT LONG TERM GOAL #4   Title Patient will improve his FGA score by 2 points compared to score at 4 weeks, indicative of a lesser fall risk.   Baseline 10/04/16 16/30   Time 8   Period Weeks   Status New               Plan - 10/31/16 0935    Clinical Impression Statement Today's skilled session continued to focus on postural strengthening/re-education and gait. No issues were reported. Pt does needed step by step instruction with moderate cues for tasks performed. Pt should benefit from continued PT to progress toward unmet goals.    Rehab Potential Good   Clinical Impairments  Affecting Rehab Potential decreased ability to understand written materials (does have a supportive wife that will assist him)   PT Frequency 2x / week   PT Duration 8 weeks   PT Treatment/Interventions ADLs/Self Care Home Management;DME Instruction;Gait training;Stair training;Functional mobility training;Therapeutic activities;Therapeutic exercise;Balance training;Neuromuscular re-education;Patient/family education;Manual techniques;Passive range of motion   PT Next Visit Plan progress posture, LE strengthening, high level balance and gait (without cane); compliant/outdoor surfaces, obstacles, cardiovascular endurance (?treadmill vs Nustep)   Consulted and Agree with Plan of Care Patient      Patient will benefit from skilled therapeutic intervention in order to improve the following deficits and impairments:  Abnormal gait, Decreased balance, Decreased knowledge of use of DME, Decreased mobility, Decreased range of motion, Impaired flexibility, Postural dysfunction  Visit Diagnosis: Unsteadiness on feet  Abnormal posture  Other symptoms and signs involving the musculoskeletal system  Other abnormalities of gait and mobility     Problem List Patient Active Problem List   Diagnosis Date Noted  . Urinary frequency 09/12/2016  . Cough 09/12/2016  . Prediabetes 05/01/2016  . Pain in both lower extremities 05/01/2016  . Bilateral hearing loss 05/01/2016  . Migraine 04/26/2015  . Aortic valve replaced 04/21/2015  . Subacute confusional state 12/14/2014  . Dyspnea 09/16/2013  . Pulmonary infiltrates 07/31/2013  . Encounter for therapeutic drug monitoring 03/20/2013  . CAD (coronary artery disease) 03/13/2013  . Atrial fibrillation, controlled ([place]) 03/13/2013  . Protein-calorie malnutrition, severe ([place]) 02/27/2013  . S/P CABG x 3 02/20/2013  . History of embolectomy 09/22/2012  . Renal insufficiency, mild 09/22/2012  . Syncope 09/15/2012  . Peripheral vascular disease,  unspecified ([place]) 07/08/2012  . Mitral stenosis 07/27/2010  .  Hyperlipidemia 10/25/2009  . Essential hypertension 10/25/2009  . NEPHROLITHIASIS, HX OF 10/25/2009  . SLEEP DISORDER 11/29/2008  . SNORING 10/14/2008  . APNEA 10/05/2008  . SKIN CANCER, HX OF 10/05/2008  . RESTLESS LEG SYNDROME 09/11/2007  . Allergic rhinitis, cause unspecified 09/11/2007  . GOUT 03/15/2006  . ASTHMA 03/15/2006  . COPD 03/15/2006  . Osteoporosis 03/15/2006  . COLONIC POLYPS, HX OF 03/15/2006    Willow Ora, PTA, [facility] [address] [place], [place] [address] [phone] 11/01/16, 4:01 PM   Name: Robert Richard MRN: [id number] Date of Birth: Aug 25, 1930

## 2016-11-02 ENCOUNTER — Ambulatory Visit: Payer: Medicare Other | Admitting: Internal Medicine

## 2016-11-04 NOTE — Progress Notes (Signed)
Subjective:    Patient ID: Robert Richard, male    DOB: February 28, 1930, 83 y.o.   MRN: [id number]  HPI The patient is here for follow up.  Afib, CAD, Hypertension: He is taking his medication daily. He is compliant with a low sodium diet.  He denies chest pain, palpitations, edema, shortness of breath and regular headaches. He is exercising regularly with PT.      Hyperlipidemia: He is taking his medication daily. He is compliant with a low fat/cholesterol diet. He is exercising regularly. He denies myalgias.   GERD:  He is taking his medication daily as prescribed.  He denies any GERD symptoms and feels his GERD is well controlled.   Prediabetes:  He is compliant with a low sugar/carbohydrate diet.  He is exercising regularly.  Anxiety: He is taking his medication daily, but is only taking 1/2 of his prescribed dose. He denies any side effects from the medication. He feels his anxiety is well controlled, but his wife he feels his anxiety is not controlled.    He is exercising with PT.    Medications and allergies reviewed with patient and updated if appropriate.  Patient Active Problem List   Diagnosis Date Noted  . Urinary frequency 09/12/2016  . Cough 09/12/2016  . Prediabetes 05/01/2016  . Pain in both lower extremities 05/01/2016  . Bilateral hearing loss 05/01/2016  . Migraine 04/26/2015  . Aortic valve replaced 04/21/2015  . Subacute confusional state 12/14/2014  . Dyspnea 09/16/2013  . Pulmonary infiltrates 07/31/2013  . Encounter for therapeutic drug monitoring 03/20/2013  . CAD (coronary artery disease) 03/13/2013  . Atrial fibrillation, controlled ([place]) 03/13/2013  . Protein-calorie malnutrition, severe ([place]) 02/27/2013  . S/P CABG x 3 02/20/2013  . History of embolectomy 09/22/2012  . CKD (chronic kidney disease) 09/22/2012  . Syncope 09/15/2012  . Peripheral vascular disease, unspecified ([place]) 07/08/2012  . Mitral stenosis 07/27/2010  . Hyperlipidemia 10/25/2009   . Essential hypertension 10/25/2009  . NEPHROLITHIASIS, HX OF 10/25/2009  . SLEEP DISORDER 11/29/2008  . SNORING 10/14/2008  . APNEA 10/05/2008  . SKIN CANCER, HX OF 10/05/2008  . RESTLESS LEG SYNDROME 09/11/2007  . Allergic rhinitis, cause unspecified 09/11/2007  . GOUT 03/15/2006  . ASTHMA 03/15/2006  . COPD 03/15/2006  . Osteoporosis 03/15/2006  . COLONIC POLYPS, HX OF 03/15/2006    Current Outpatient Prescriptions on File Prior to Visit  Medication Sig Dispense Refill  . busPIRone (BUSPAR) 10 MG tablet Take 2 tablets (20 mg total) by mouth 2 (two) times daily. 120 tablet 0  . diltiazem (CARDIZEM CD) 120 MG 24 hr capsule TAKE 1 CAPSULE (120 MG TOTAL) BY MOUTH DAILY. TAKE IN ADDITION TO 240MG  DAILY 30 capsule 7  . diltiazem (CARDIZEM CD) 240 MG 24 hr capsule Take 1 capsule (240 mg total) by mouth daily. 30 capsule 6  . gabapentin (NEURONTIN) 100 MG capsule Take 1 tablet at bedtime x 1 week, then increase to 1 tablet twice daily 180 capsule 3  . ketotifen (ZADITOR) 0.025 % ophthalmic solution Place 1 drop into both eyes daily.     . Lidocaine HCl 4 % SOLN Apply 4 mLs topically as needed (for migraines).     . Multiple Vitamins-Minerals (DAILY MULTIVITAMIN) CAPS Take 1 capsule by mouth daily.     . nitroGLYCERIN (NITROSTAT) 0.4 MG SL tablet Place 0.4 mg under the tongue every 5 (five) minutes as needed for chest pain.    . polyethylene glycol (MIRALAX / GLYCOLAX) packet Take  17 g by mouth daily. 14 each 0  . pravastatin (PRAVACHOL) 40 MG tablet TAKE ONE (1) TABLET EACH DAY 90 tablet 0  . ranitidine (ZANTAC) 150 MG tablet TAKE ONE TABLET BY MOUTH TWICE A DAY 180 tablet 0  . topiramate (TOPAMAX) 25 MG tablet Take 75 mg by mouth daily.    Marland Kitchen warfarin (COUMADIN) 2 MG tablet TAKE 1 TABLET TO 1 AND 1/2 TABLETS DAILYOR AS DIRECTED BY COUMADIN CLINIC 40 tablet 3   No current facility-administered medications on file prior to visit.     Past Medical History:  Diagnosis Date  . Allergic  rhinitis   . Anemia   . Arthritis    SHOULDER  . Asthma with bronchitis   . Colon polyp   . COPD (chronic obstructive pulmonary disease) (HCC)    Astmatic bronchitis  . Dysrhythmia 08/2012   NEW ONSET ATRIAL FIBRILATION  . Gout    PMH  . Headache(784.0)   . HLD (hyperlipidemia)   . Nephrolithiasis   . Osteoporosis   . RLS (restless legs syndrome)   . Skin cancer (melanoma) ([place]) 2012   Right neck  . Skin cancer, basal cell    Dr Nevada Crane, [facility]    Past Surgical History:  Procedure Laterality Date  . AORTIC VALVE REPLACEMENT N/A 02/20/2013   Procedure: AORTIC VALVE REPLACEMENT (AVR);  Surgeon: Ivin Poot, MD;  Location: [place];  Service: Open Heart Surgery;  Laterality: N/A;  . COLONOSCOPY W/ POLYPECTOMY     Dr Deatra Ina  . CORONARY ARTERY BYPASS GRAFT N/A 02/20/2013   Procedure: CORONARY ARTERY BYPASS GRAFTING (CABG);  Surgeon: Ivin Poot, MD;  Location: [place];  Service: Open Heart Surgery;  Laterality: N/A;  . FINGER SURGERY     for foreign body  . INGUINAL HERNIA REPAIR    . INTRAOPERATIVE TRANSESOPHAGEAL ECHOCARDIOGRAM N/A 02/20/2013   Procedure: INTRAOPERATIVE TRANSESOPHAGEAL ECHOCARDIOGRAM;  Surgeon: Ivin Poot, MD;  Location: [place];  Service: Open Heart Surgery;  Laterality: N/A;  . LEFT AND RIGHT HEART CATHETERIZATION WITH CORONARY ANGIOGRAM N/A 01/05/2013   Procedure: LEFT AND RIGHT HEART CATHETERIZATION WITH CORONARY ANGIOGRAM;  Surgeon: Blane Ohara, MD;  Location: [facility] CATH LAB;  Service: Cardiovascular;  Laterality: N/A;  . LEG SURGERY  06/2010    Dr.Lawson, for blood clott. Left leg   . NECK SURGERY  12/2010   Melanoma ; [facility]   . NOSE SURGERY     Septal Deviation  . RIGHT HEART CATHETERIZATION N/A 01/23/2013   Procedure: RIGHT HEART CATH;  Surgeon: Jolaine Artist, MD;  Location: [facility] CATH LAB;  Service: Cardiovascular;  Laterality: N/A;  . TEE WITHOUT CARDIOVERSION N/A 01/23/2013   Procedure: TRANSESOPHAGEAL ECHOCARDIOGRAM (TEE);  Surgeon:  Jolaine Artist, MD;  Location[facility] ENDOSCOPY;  Service: Cardiovascular;  Laterality: N/A;  sign heart cath consent w/tee consent    Social History   Social History  . Marital status: Married    Spouse name: N/A  . Number of children: N/A  . Years of education: N/A   Occupational History  . retired Astron[employer]   Social History Main Topics  . Smoking status: Former Smoker    Packs/day: 3.00    Years: 15.00    Types: Cigarettes    Quit date: 02/19/1961  . Smokeless tobacco: Never Used  . Alcohol use No  . Drug use: No  . Sexual activity: Not Asked   Other Topics Concern  . None   Social History Narrative  .  None    Family History  Problem Relation Age of Onset  . Allergies Mother   . Coronary artery disease Mother   . Allergy (severe) Mother   . Allergies Father   . Stroke Father   . Heart attack Father 20  . Asthma Father   . Allergy (severe) Father   . Allergies Sister   . Allergy (severe) Sister   . Allergies Brother        x2  . Allergy (severe) Brother   . Diabetes Brother   . Allergy (severe) Brother   . Emphysema Brother        smoked  . Cancer Son     Review of Systems  Constitutional: Negative for chills and fever.  Respiratory: Negative for cough, shortness of breath and wheezing.   Cardiovascular: Negative for chest pain, palpitations and leg swelling.  Gastrointestinal: Negative for abdominal pain and nausea.  Neurological: Negative for dizziness, light-headedness and headaches (migraines controlled).       Objective:   Vitals:   11/05/16 1537  BP: 136/60  Pulse: 65  Resp: 16  Temp: 97.7 F (36.5 C)  SpO2: 98%   Wt Readings from Last 3 Encounters:  11/05/16 144 lb (65.3 kg)  09/12/16 141 lb (64 kg)  08/16/16 143 lb 1.9 oz (64.9 kg)   Body mass index is 19 kg/m.   Physical Exam    Constitutional: Appears well-developed and well-nourished. No distress.  HENT:  Head: Normocephalic and atraumatic.  Neck: Neck supple.  No tracheal deviation present. No thyromegaly present.  No cervical lymphadenopathy Cardiovascular: Normal rate, regular rhythm and normal heart sounds.    2/6 systolic murmur heard. No carotid bruit .  No edema /Pulmonary/Chest: Effort normal and breath sounds normal. No respiratory distress. No has no wheezes. No rales.  Skin: Skin is warm and dry. Not diaphoretic.  Psychiatric: Normal mood and affect. Behavior is normal.      Assessment & Plan:    See Problem List for Assessment and Plan of chronic medical problems.

## 2016-11-04 NOTE — Patient Instructions (Addendum)

## 2016-11-05 ENCOUNTER — Other Ambulatory Visit: Payer: Self-pay | Admitting: Internal Medicine

## 2016-11-05 ENCOUNTER — Ambulatory Visit: Payer: Medicare Other | Admitting: Audiology

## 2016-11-05 ENCOUNTER — Ambulatory Visit (INDEPENDENT_AMBULATORY_CARE_PROVIDER_SITE_OTHER): Payer: Medicare Other | Admitting: Internal Medicine

## 2016-11-05 ENCOUNTER — Other Ambulatory Visit: Payer: Self-pay

## 2016-11-05 ENCOUNTER — Other Ambulatory Visit (INDEPENDENT_AMBULATORY_CARE_PROVIDER_SITE_OTHER): Payer: Medicare Other

## 2016-11-05 ENCOUNTER — Encounter: Payer: Self-pay | Admitting: Internal Medicine

## 2016-11-05 VITALS — BP 136/60 | HR 65 | Temp 97.7°F | Resp 16 | Wt 144.0 lb

## 2016-11-05 DIAGNOSIS — H90A31 Mixed conductive and sensorineural hearing loss, unilateral, right ear with restricted hearing on the contralateral side: Secondary | ICD-10-CM | POA: Diagnosis not present

## 2016-11-05 DIAGNOSIS — R293 Abnormal posture: Secondary | ICD-10-CM | POA: Diagnosis not present

## 2016-11-05 DIAGNOSIS — R7303 Prediabetes: Secondary | ICD-10-CM | POA: Diagnosis not present

## 2016-11-05 DIAGNOSIS — H93213 Auditory recruitment, bilateral: Secondary | ICD-10-CM

## 2016-11-05 DIAGNOSIS — F419 Anxiety disorder, unspecified: Secondary | ICD-10-CM | POA: Diagnosis not present

## 2016-11-05 DIAGNOSIS — I10 Essential (primary) hypertension: Secondary | ICD-10-CM

## 2016-11-05 DIAGNOSIS — R2689 Other abnormalities of gait and mobility: Secondary | ICD-10-CM | POA: Diagnosis not present

## 2016-11-05 DIAGNOSIS — H9312 Tinnitus, left ear: Secondary | ICD-10-CM | POA: Diagnosis not present

## 2016-11-05 DIAGNOSIS — E7800 Pure hypercholesterolemia, unspecified: Secondary | ICD-10-CM

## 2016-11-05 DIAGNOSIS — I2510 Atherosclerotic heart disease of native coronary artery without angina pectoris: Secondary | ICD-10-CM | POA: Diagnosis not present

## 2016-11-05 DIAGNOSIS — I4891 Unspecified atrial fibrillation: Secondary | ICD-10-CM | POA: Diagnosis not present

## 2016-11-05 DIAGNOSIS — R94120 Abnormal auditory function study: Secondary | ICD-10-CM | POA: Diagnosis not present

## 2016-11-05 DIAGNOSIS — H90A22 Sensorineural hearing loss, unilateral, left ear, with restricted hearing on the contralateral side: Secondary | ICD-10-CM

## 2016-11-05 DIAGNOSIS — R29898 Other symptoms and signs involving the musculoskeletal system: Secondary | ICD-10-CM | POA: Diagnosis not present

## 2016-11-05 DIAGNOSIS — R2681 Unsteadiness on feet: Secondary | ICD-10-CM | POA: Diagnosis not present

## 2016-11-05 DIAGNOSIS — H93299 Other abnormal auditory perceptions, unspecified ear: Secondary | ICD-10-CM

## 2016-11-05 LAB — CBC WITH DIFFERENTIAL/PLATELET
Basophils Absolute: 0 10*3/uL (ref 0.0–0.1)
Basophils Relative: 0.6 % (ref 0.0–3.0)
Eosinophils Absolute: 0.2 10*3/uL (ref 0.0–0.7)
Eosinophils Relative: 2 % (ref 0.0–5.0)
HCT: 34.3 % — ABNORMAL LOW (ref 39.0–52.0)
Hemoglobin: 11.2 g/dL — ABNORMAL LOW (ref 13.0–17.0)
Lymphocytes Relative: 26.7 % (ref 12.0–46.0)
Lymphs Abs: 2.2 10*3/uL (ref 0.7–4.0)
MCHC: 32.8 g/dL (ref 30.0–36.0)
MCV: 90.2 fl (ref 78.0–100.0)
Monocytes Absolute: 1 10*3/uL (ref 0.1–1.0)
Monocytes Relative: 12.3 % — ABNORMAL HIGH (ref 3.0–12.0)
Neutro Abs: 4.9 10*3/uL (ref 1.4–7.7)
Neutrophils Relative %: 58.4 % (ref 43.0–77.0)
Platelets: 194 10*3/uL (ref 150.0–400.0)
RBC: 3.8 Mil/uL — ABNORMAL LOW (ref 4.22–5.81)
RDW: 14.4 % (ref 11.5–15.5)
WBC: 8.4 10*3/uL (ref 4.0–10.5)

## 2016-11-05 LAB — COMPREHENSIVE METABOLIC PANEL
ALT: 9 U/L (ref 0–53)
AST: 14 U/L (ref 0–37)
Albumin: 4.2 g/dL (ref 3.5–5.2)
Alkaline Phosphatase: 76 U/L (ref 39–117)
BUN: 26 mg/dL — ABNORMAL HIGH (ref 6–23)
CO2: 22 mEq/L (ref 19–32)
Calcium: 9.8 mg/dL (ref 8.4–10.5)
Chloride: 108 mEq/L (ref 96–112)
Creatinine, Ser: 1.71 mg/dL — ABNORMAL HIGH (ref 0.40–1.50)
GFR: 40.69 mL/min — ABNORMAL LOW (ref >60.00)
Glucose, Bld: 158 mg/dL — ABNORMAL HIGH (ref 70–99)
Potassium: 4.2 mEq/L (ref 3.5–5.1)
Sodium: 140 mEq/L (ref 135–145)
Total Bilirubin: 0.4 mg/dL (ref 0.2–1.2)
Total Protein: 8 g/dL (ref 6.0–8.3)

## 2016-11-05 LAB — HEMOGLOBIN A1C: Hgb A1c MFr Bld: 5.9 % (ref 4.6–6.5)

## 2016-11-05 MED ORDER — DILTIAZEM HCL ER COATED BEADS 240 MG PO CP24: 240.0000 mg | ORAL_CAPSULE | Freq: Every day | ORAL | 2 refills | Status: DC

## 2016-11-05 MED ORDER — DILTIAZEM HCL ER COATED BEADS 120 MG PO CP24: ORAL_CAPSULE | ORAL | 2 refills | Status: DC

## 2016-11-05 NOTE — Assessment & Plan Note (Signed)
BP well controlled Current regimen effective and well tolerated Continue current medications at current doses cmp  

## 2016-11-05 NOTE — Procedures (Signed)
[facility]  [place], [place] [address]  [phone]   Audiological Evaluation  Patient Name: Robert Richard   Status: Outpatient   DOB: October 28, 1930    Diagnosis: Bilateral Hearing Loss            MRN: [id number] Date:  11/05/2016     Referent: [name], MD  History: Waynette Buttery was seen for an audiological evaluation. Uses TV ears at the house. Currently [employer] is getting PT "for walking and balance".  Accompanied by: Chauncey Fischer Raval's wife Primary Concern: Hearing loss in left ear with tinnitus.  Hears better out the right ear which has no ringing. Been gradually getting worse. Pain: None History of hearing problems: Y  History of ear infections:   N, but has "frequent sinus infections" History of dizziness/vertigo:  N History of balance issues:  Y - "legs are weak".  Tinnitus: Y - left ear.  Doesn't hear the ringing when using the TV ears. Sound sensitivity: N History of occupational noise exposure: Y - Worked at [employer] for 38 years. History of hypertension: N History of diabetes:  N Other concerns:Had "open heart surgery 4 years ago for blockages and had a valve put in". Following the surgery.  Has had "double vision" for about 20 years - glasses help,but "had to quit driving".  Family History of hearing loss: Y - brother and sister.    Evaluation: Conventional pure tone audiometry from 250Hz  - 8000Hz  with using insert earphones.  Hearing Thresholds: Right ear:  Thresholds of 35 dBHL at 250Hz ; 50-55 dBHL from 500Hz  - 2000Hz ; 60-65 dBHL from 4000Hz  - 6000Hz  and 80 dBHL at 8000Hz . The hearing loss appears mixed.  Left ear:    Thresholds of 70-75 dBHL from 250Hz  - 8000Hz  except for 60 dBHL at 6000Hz  only. The hearing loss appears sensorineural. Reliability is good Speech reception levels (repeating words near threshold) using recorded spondee word lists:  Right ear: 55 dBHL (SRT)  Left ear:  65 dBHL (Speech  detection thresholds) Word recognition (at comfortably loud volumes) using recorded PBK word lists in quiet.  Right ear: 92% at 85 dBHL.  Left ear:   56% at 95 dBHL with contralateral masking.  Tympanometry (middle ear function) with ipsilateral acoustic reflexes.  Right ear: Normal (Type C) has excessive negative middle ear pressure of -220 daPa.  Left ear: Normal (Type A) .   Tinnitus "went away during testing" so no matching could be completed.  Diesel states that tinnitus suppresses when watching TV with headphones too.  CONCLUSION:      Juris has a mild to moderately severe mixed hearing loss on the right side with abnormal middle ear function because of negative pressure.  The left ear has a severe sensorineural hearing loss with normal middle ear pressure. Antoino H Noxon has recruitment bilaterally. Word recognition is excellent in the right ear but is poor on the left side at extremely loud levels, equivalent to shouting at 1-2 feet.  This amount of hearing loss would adversely affect speech communication at most conversational and social communication levels.  In addition, please note that the left sided tinnitus appears to easily suppress.Naresh would be an excellent candidate for amplification, especially on the right side; therefore a hearing aid evaluation is recommended.The test results were discussed and JOB HOLTSCLAW counseled and the apps Starkey Relax (for tinnitus) was discussed.  RECOMMENDATIONS: 1.    Consider further evaluation of the mixed hearing  loss on the right side and tinnitus by an Ear, Nose and Throat physician. However, please note that Cuyler states he is "having sinus issues right now" but that usually the medication that he takes "help a lot".  2.   A hearing aid evaluation at the location of [place]'s choice.  3. To minimize the adverse effects of tinnitus 1) avoid quiet  2) use noise maskers at home such as a sound machine, quiet music, a fan or other background  noise at a volume just loud enough to mask the high pitched tinnitus. 3) If the tinnitus becomes more bothersome, adversely affecting your sleep or concentration, contact your physician,  seek additional medical help by an ENT for further treatment of your tinnitus.  Please note that Thane H Welz's tinnitus seems to easily suppress with short exposure to sound. 4.  Strategies that help improve hearing include: A) Face the speaker directly. Optimal is having the speakers face well - lit.  Unless amplified, being within 3-6 feet of the speaker will enhance word recognition. B) Avoid having the speaker back-lit as this will minimize the ability to use cues from lip-reading, facial expression and gestures. C)  Word recognition is poorer in background noise. For optimal word recognition, turn off the TV, radio or noisy fan when engaging in conversation. In a restaurant, try to sit away from noise sources and close to the primary speaker.  D)  Ask for topic clarification from time to time in order to remain in the conversation.  Most people don't mind repeating or clarifying a point when asked.  If needed, explain the difficulty hearing in background noise or hearing loss.   Deborah L. Heide Spark, Au.D., CCC-A Doctor of Audiology 11/05/2016  cc: [name], MD

## 2016-11-05 NOTE — Assessment & Plan Note (Signed)
Rate controlled, on warfarin Asymptomatic Following w/ cardio cbc Continue current meds

## 2016-11-05 NOTE — Assessment & Plan Note (Signed)
Check a1c Low sugar / carb diet Stressed regular exercise   

## 2016-11-05 NOTE — Assessment & Plan Note (Signed)
Continue statin. 

## 2016-11-05 NOTE — Assessment & Plan Note (Signed)
Taking only 1/2 of prescribed dose Wife thinks he needs higher dose Increase to prescribed dose of 20 mg twice daily

## 2016-11-05 NOTE — Assessment & Plan Note (Signed)
No cp, palps or sob Continue current medication

## 2016-11-06 ENCOUNTER — Ambulatory Visit: Payer: Medicare Other | Admitting: Physical Therapy

## 2016-11-06 ENCOUNTER — Encounter: Payer: Self-pay | Admitting: Physical Therapy

## 2016-11-06 DIAGNOSIS — H90A22 Sensorineural hearing loss, unilateral, left ear, with restricted hearing on the contralateral side: Secondary | ICD-10-CM | POA: Diagnosis not present

## 2016-11-06 DIAGNOSIS — R94120 Abnormal auditory function study: Secondary | ICD-10-CM | POA: Diagnosis not present

## 2016-11-06 DIAGNOSIS — H93213 Auditory recruitment, bilateral: Secondary | ICD-10-CM | POA: Diagnosis not present

## 2016-11-06 DIAGNOSIS — H93299 Other abnormal auditory perceptions, unspecified ear: Secondary | ICD-10-CM | POA: Diagnosis not present

## 2016-11-06 DIAGNOSIS — D485 Neoplasm of uncertain behavior of skin: Secondary | ICD-10-CM | POA: Diagnosis not present

## 2016-11-06 DIAGNOSIS — H90A31 Mixed conductive and sensorineural hearing loss, unilateral, right ear with restricted hearing on the contralateral side: Secondary | ICD-10-CM | POA: Diagnosis not present

## 2016-11-06 DIAGNOSIS — R2681 Unsteadiness on feet: Secondary | ICD-10-CM

## 2016-11-06 DIAGNOSIS — R2689 Other abnormalities of gait and mobility: Secondary | ICD-10-CM

## 2016-11-06 DIAGNOSIS — R29898 Other symptoms and signs involving the musculoskeletal system: Secondary | ICD-10-CM | POA: Diagnosis not present

## 2016-11-06 DIAGNOSIS — H9312 Tinnitus, left ear: Secondary | ICD-10-CM | POA: Diagnosis not present

## 2016-11-06 DIAGNOSIS — Z85828 Personal history of other malignant neoplasm of skin: Secondary | ICD-10-CM | POA: Diagnosis not present

## 2016-11-06 DIAGNOSIS — R293 Abnormal posture: Secondary | ICD-10-CM | POA: Diagnosis not present

## 2016-11-06 DIAGNOSIS — Z08 Encounter for follow-up examination after completed treatment for malignant neoplasm: Secondary | ICD-10-CM | POA: Diagnosis not present

## 2016-11-06 DIAGNOSIS — D225 Melanocytic nevi of trunk: Secondary | ICD-10-CM | POA: Diagnosis not present

## 2016-11-06 NOTE — Therapy (Signed)
[facility] [address] [place], [place], [address] Phone: [phone]   Fax:  [phone]  Physical Therapy Treatment  Patient Details  Name: Robert Richard MRN: [id number] Date of Birth: Jul 08, 1930 Referring Provider: Dorris Carnes  Encounter Date: 11/06/2016      PT End of Session - 11/06/16 2234    Visit Number 15   Number of Visits 17   Date for PT Re-Evaluation 98/92/11  recert 10/24/15   Authorization Type [employer] Medicare  G-code every 10th visit   Authorization Time Period 08/24/16 to 10/23/16; 10/23/16 to 12/23/16   PT Start Time 1402   PT Stop Time 1442   PT Time Calculation (min) 40 min   Equipment Utilized During Treatment Gait belt  used pt's leather belt   Activity Tolerance Patient tolerated treatment well   Behavior During Therapy WFL for tasks assessed/performed      Past Medical History:  Diagnosis Date  . Allergic rhinitis   . Anemia   . Arthritis    SHOULDER  . Asthma with bronchitis   . Colon polyp   . COPD (chronic obstructive pulmonary disease) (HCC)    Astmatic bronchitis  . Dysrhythmia 08/2012   NEW ONSET ATRIAL FIBRILATION  . Gout    PMH  . Headache(784.0)   . HLD (hyperlipidemia)   . Nephrolithiasis   . Osteoporosis   . RLS (restless legs syndrome)   . Skin cancer (melanoma) ([place]) 2012   Right neck  . Skin cancer, basal cell    Dr Nevada Crane, [facility]    Past Surgical History:  Procedure Laterality Date  . AORTIC VALVE REPLACEMENT N/A 02/20/2013   Procedure: AORTIC VALVE REPLACEMENT (AVR);  Surgeon: Ivin Poot, MD;  Location: [facility];  Service: Open Heart Surgery;  Laterality: N/A;  . COLONOSCOPY W/ POLYPECTOMY     Dr Deatra Ina  . CORONARY ARTERY BYPASS GRAFT N/A 02/20/2013   Procedure: CORONARY ARTERY BYPASS GRAFTING (CABG);  Surgeon: Ivin Poot, MD;  Location: [facility];  Service: Open Heart Surgery;  Laterality: N/A;  . FINGER SURGERY     for foreign body  . INGUINAL HERNIA REPAIR     . INTRAOPERATIVE TRANSESOPHAGEAL ECHOCARDIOGRAM N/A 02/20/2013   Procedure: INTRAOPERATIVE TRANSESOPHAGEAL ECHOCARDIOGRAM;  Surgeon: Ivin Poot, MD;  Location: [facility];  Service: Open Heart Surgery;  Laterality: N/A;  . LEFT AND RIGHT HEART CATHETERIZATION WITH CORONARY ANGIOGRAM N/A 01/05/2013   Procedure: LEFT AND RIGHT HEART CATHETERIZATION WITH CORONARY ANGIOGRAM;  Surgeon: Blane Ohara, MD;  Location: [facility] CATH LAB;  Service: Cardiovascular;  Laterality: N/A;  . LEG SURGERY  06/2010    Dr.Lawson, for blood clott. Left leg   . NECK SURGERY  12/2010   Melanoma ; [facility]   . NOSE SURGERY     Septal Deviation  . RIGHT HEART CATHETERIZATION N/A 01/23/2013   Procedure: RIGHT HEART CATH;  Surgeon: Jolaine Artist, MD;  Location: [facility] CATH LAB;  Service: Cardiovascular;  Laterality: N/A;  . TEE WITHOUT CARDIOVERSION N/A 01/23/2013   Procedure: TRANSESOPHAGEAL ECHOCARDIOGRAM (TEE);  Surgeon: Jolaine Artist, MD;  Location: [facility] ENDOSCOPY;  Service: Cardiovascular;  Laterality: N/A;  sign heart cath consent w/tee consent    There were no vitals filed for this visit.      Subjective Assessment - 11/06/16 1404    Subjective (P)  Still using cane whenever he goes outside, does not use it inside.    Patient is accompained by: (P)  Family member  spouse in lobby   Limitations (P)  Walking   How long can you walk comfortably? (P)  varies day to day; can walk driveway 3-4 times some days, other days only once; leg fatigue causes him to sit   Patient Stated Goals (P)  Walking better; walk without the cane (has used for 3 yrs)   Currently in Pain? (P)  No/denies                         [facility] Adult PT Treatment/Exercise - 11/06/16 1425      Transfers   Transfers Sit to Stand;Stand to Sit   Sit to Stand 4: Min guard   Sit to Stand Details (indicate cue type and reason) on blue beam x 5, minguard for safety   Stand to Sit 4: Min guard     Ambulation/Gait    Ambulation/Gait Assistance 5: Supervision   Ambulation/Gait Assistance Details vc for heel strike to reduce shuffling of his feet; moderate cues for upright posture   Ambulation Distance (Feet) 100 Feet  400 (outside with cane); 500   Assistive device Straight cane;None  SPC outside   Gait Pattern Step-through pattern;Decreased arm swing - right;Decreased arm swing - left;Decreased step length - right;Decreased step length - left;Right foot flat;Left foot flat;Shuffle;Trunk flexed;Poor foot clearance - right   Ambulation Surface Level;Unlevel;Indoor;Outdoor;Paved;Gravel;Grass   Curb 6: Modified independent (Device/increase time)   Curb Details (indicate cue type and reason) x 2 with cane   Gait Comments also on red + blue mat with weights underneath for unlevel, compliant surface (pt taking very short, cautious steps with no LOB)     Posture/Postural Control   Posture/Postural Control Postural limitations   Postural Limitations Rounded Shoulders;Forward head;Increased thoracic kyphosis   Posture Comments supine chin tuck with good results (on thin pillow); lots of cues throughtout session for upright posture     Exercises   Exercises Shoulder     Lumbar Exercises: Quadruped   Straight Leg Raise 20 reps   Straight Leg Raises Limitations tactile cues to keep pelvis level     Knee/Hip Exercises: Aerobic   Nustep L5 x 3.5 minutes     Shoulder Exercises: Seated   Row Strengthening;Both;15 reps;Theraband  red band; 2 sets             Balance Exercises - 11/06/16 1446      Balance Exercises: Standing   Standing Eyes Opened Narrow base of support (BOS);Foam/compliant surface  red mat   SLS with Vectors Foam/compliant surface  toe and then heel taps on foam bubbles while standing on red   Stepping Strategy Anterior;Foam/compliant surface;5 reps   Retro Gait Foam/compliant surface;2 reps   Sidestepping Foam/compliant support;4 reps           PT Education - 11/06/16 2234     Education provided Yes   Education Details focus on his postural exercises/stretches to improve his balance/gait   Person(s) Educated Patient   Methods Explanation;Demonstration;Verbal cues   Comprehension Verbalized understanding;Returned demonstration;Verbal cues required          PT Short Term Goals - 10/02/16 1911      PT SHORT TERM GOAL #1   Title Patient/spouse will demonstrate independence in HEP for balance, ROM, and strengthening. (Target date for all STGs 09/21/16)   Time 4   Period Weeks   Status Achieved     PT SHORT TERM GOAL #2   Title Patient will demonstrate improved LE strength and  balance with improved 5 times sit to stand time to <17 seconds.   Baseline 10/02/16  17.5 seconds   Time 4   Period Weeks   Status Partially Met     PT SHORT TERM GOAL #3   Title Patient will improve gait velocity to >= 2.62 ft/sec (safe community ambulator velocity) while demonstrating appropriate use of assistive device.   Baseline 10/02/16 2.66 ft/sec   Time 4   Period Weeks   Status Achieved     PT SHORT TERM GOAL #4   Title Patient will improve FGA score by 3 points from his baseline to indicate a lesser risk of falling.    Baseline 08/28/16 scored 16/30 ; 10/02/16  12/30 (returned after missing several weeks of PT due to illness)   Time 4   Period Weeks   Status Not Met     PT SHORT TERM GOAL #5   Title Patient/spouse will complete education on fall prevention.    Time 4   Period Weeks   Status On-going           PT Long Term Goals - 10/24/16 1438      PT LONG TERM GOAL #1   Title Patient/spouse will demonstrate independence in updated/final HEP for balance, ROM, and strengthening. (Target date for all LTGs 10/23/16--updated to 11/16/16 due to missed 3 weeks)   Time 8   Period Weeks   Status New     PT LONG TERM GOAL #2   Title Patient will improve TUG normal time to <13.5 seconds, indicative of a lesser risk of falling.    Time 8   Period Weeks   Status  New     PT LONG TERM GOAL #3   Title Patient will improve gait velocity to >=2.86 ft/sec (10% less than normal speed for healthy 80-89 yr olds, 3.18 ft/sec).   Time 8   Period Weeks   Status New     PT LONG TERM GOAL #4   Title Patient will improve his FGA score by 2 points compared to score at 4 weeks, indicative of a lesser fall risk.   Baseline 10/04/16 16/30   Time 8   Period Weeks   Status New               Plan - 11/06/16 2238    Clinical Impression Statement Session focused on gait training with/without SPC and both outdoors and indoors. Patient required vc for upright posture ~70% of session with several times where he self corrected. Patient reports they have not had a chance to meet with orthotist re: shoe modification. Continue to work towards [employer]   Clinical Impairments Affecting Rehab Potential decreased ability to understand written materials (does have a supportive wife that will assist him)   PT Frequency 2x / week   PT Duration 8 weeks   PT Treatment/Interventions ADLs/Self Care Home Management;DME Instruction;Gait training;Stair training;Functional mobility training;Therapeutic activities;Therapeutic exercise;Balance training;Neuromuscular re-education;Patient/family education;Manual techniques;Passive range of motion   PT Next Visit Plan progress posture, LE strengthening (esp lt hip extension and abdct), high level balance and gait (without cane); obstacles, cardiovascular endurance (Nustep)   Consulted and Agree with Plan of Care Patient      Patient will benefit from skilled therapeutic intervention in order to improve the following deficits and impairments:  Abnormal gait, Decreased balance, Decreased knowledge of use of DME, Decreased mobility, Decreased range of motion, Impaired flexibility, Postural dysfunction  Visit Diagnosis: Unsteadiness on feet  Abnormal posture  Other abnormalities of gait and mobility     Problem  List Patient Active Problem List   Diagnosis Date Noted  . Anxiety 11/05/2016  . Urinary frequency 09/12/2016  . Cough 09/12/2016  . Prediabetes 05/01/2016  . Pain in both lower extremities 05/01/2016  . Bilateral hearing loss 05/01/2016  . Migraine 04/26/2015  . Aortic valve replaced 04/21/2015  . Subacute confusional state 12/14/2014  . Dyspnea 09/16/2013  . Pulmonary infiltrates 07/31/2013  . Encounter for therapeutic drug monitoring 03/20/2013  . CAD (coronary artery disease) 03/13/2013  . Atrial fibrillation, controlled ([place]) 03/13/2013  . Protein-calorie malnutrition, severe ([place]) 02/27/2013  . S/P CABG x 3 02/20/2013  . History of embolectomy 09/22/2012  . CKD (chronic kidney disease) 09/22/2012  . Syncope 09/15/2012  . Peripheral vascular disease, unspecified ([place]) 07/08/2012  . Mitral stenosis 07/27/2010  . Hyperlipidemia 10/25/2009  . Essential hypertension 10/25/2009  . NEPHROLITHIASIS, HX OF 10/25/2009  . SLEEP DISORDER 11/29/2008  . SNORING 10/14/2008  . APNEA 10/05/2008  . SKIN CANCER, HX OF 10/05/2008  . RESTLESS LEG SYNDROME 09/11/2007  . Allergic rhinitis, cause unspecified 09/11/2007  . GOUT 03/15/2006  . ASTHMA 03/15/2006  . COPD 03/15/2006  . Osteoporosis 03/15/2006  . COLONIC POLYPS, HX OF 03/15/2006    Rexanne Mano, PT 11/06/2016, 10:43 PM  [facility] [address] [place], [place], [address] Phone: [phone]   Fax:  [phone]  Name: LAURIS SERVISS MRN: [id number] Date of Birth: 1930/09/26

## 2016-11-08 ENCOUNTER — Encounter: Payer: Self-pay | Admitting: Physical Therapy

## 2016-11-08 ENCOUNTER — Ambulatory Visit (INDEPENDENT_AMBULATORY_CARE_PROVIDER_SITE_OTHER): Payer: Medicare Other | Admitting: *Deleted

## 2016-11-08 ENCOUNTER — Ambulatory Visit: Payer: Medicare Other | Admitting: Physical Therapy

## 2016-11-08 DIAGNOSIS — Z952 Presence of prosthetic heart valve: Secondary | ICD-10-CM

## 2016-11-08 DIAGNOSIS — R2681 Unsteadiness on feet: Secondary | ICD-10-CM

## 2016-11-08 DIAGNOSIS — H90A31 Mixed conductive and sensorineural hearing loss, unilateral, right ear with restricted hearing on the contralateral side: Secondary | ICD-10-CM | POA: Diagnosis not present

## 2016-11-08 DIAGNOSIS — R29898 Other symptoms and signs involving the musculoskeletal system: Secondary | ICD-10-CM

## 2016-11-08 DIAGNOSIS — I4891 Unspecified atrial fibrillation: Secondary | ICD-10-CM | POA: Diagnosis not present

## 2016-11-08 DIAGNOSIS — H93213 Auditory recruitment, bilateral: Secondary | ICD-10-CM | POA: Diagnosis not present

## 2016-11-08 DIAGNOSIS — R94120 Abnormal auditory function study: Secondary | ICD-10-CM | POA: Diagnosis not present

## 2016-11-08 DIAGNOSIS — R2689 Other abnormalities of gait and mobility: Secondary | ICD-10-CM | POA: Diagnosis not present

## 2016-11-08 DIAGNOSIS — H9312 Tinnitus, left ear: Secondary | ICD-10-CM | POA: Diagnosis not present

## 2016-11-08 DIAGNOSIS — R293 Abnormal posture: Secondary | ICD-10-CM | POA: Diagnosis not present

## 2016-11-08 DIAGNOSIS — Z5181 Encounter for therapeutic drug level monitoring: Secondary | ICD-10-CM

## 2016-11-08 DIAGNOSIS — H93299 Other abnormal auditory perceptions, unspecified ear: Secondary | ICD-10-CM | POA: Diagnosis not present

## 2016-11-08 DIAGNOSIS — H90A22 Sensorineural hearing loss, unilateral, left ear, with restricted hearing on the contralateral side: Secondary | ICD-10-CM | POA: Diagnosis not present

## 2016-11-08 LAB — POCT INR: INR: 2.2

## 2016-11-08 NOTE — Therapy (Signed)
[facility] [address] [place], [place], [address] Phone: [phone]   Fax:  [phone]  Physical Therapy Treatment  Patient Details  Name: Robert Richard MRN: [id number] Date of Birth: March 26, 1930 Referring Provider: Dorris Carnes  Encounter Date: 11/08/2016      PT End of Session - 11/08/16 1601    Visit Number 16   Number of Visits 33  based on recert 9/4, with plan to only need 6 of the additional visits   Date for PT Re-Evaluation 08/30/17  recert 08/24/86   Authorization Type UHC Medicare  G-code every 10th visit   Authorization Time Period 08/24/16 to 10/23/16; 10/23/16 to 12/23/16   PT Start Time 1400   PT Stop Time 1444   PT Time Calculation (min) 44 min   Equipment Utilized During Treatment Gait belt  used pt's leather belt   Activity Tolerance Patient tolerated treatment well   Behavior During Therapy WFL for tasks assessed/performed      Past Medical History:  Diagnosis Date  . Allergic rhinitis   . Anemia   . Arthritis    SHOULDER  . Asthma with bronchitis   . Colon polyp   . COPD (chronic obstructive pulmonary disease) (HCC)    Astmatic bronchitis  . Dysrhythmia 08/2012   NEW ONSET ATRIAL FIBRILATION  . Gout    PMH  . Headache(784.0)   . HLD (hyperlipidemia)   . Nephrolithiasis   . Osteoporosis   . RLS (restless legs syndrome)   . Skin cancer (melanoma) ([place]) 2012   Right neck  . Skin cancer, basal cell    Dr Nevada Crane, [facility]    Past Surgical History:  Procedure Laterality Date  . AORTIC VALVE REPLACEMENT N/A 02/20/2013   Procedure: AORTIC VALVE REPLACEMENT (AVR);  Surgeon: Ivin Poot, MD;  Location: [place];  Service: Open Heart Surgery;  Laterality: N/A;  . COLONOSCOPY W/ POLYPECTOMY     Dr Deatra Ina  . CORONARY ARTERY BYPASS GRAFT N/A 02/20/2013   Procedure: CORONARY ARTERY BYPASS GRAFTING (CABG);  Surgeon: Ivin Poot, MD;  Location: [facility] [place];  Service: Open Heart Surgery;  Laterality:  N/A;  . FINGER SURGERY     for foreign body  . INGUINAL HERNIA REPAIR    . INTRAOPERATIVE TRANSESOPHAGEAL ECHOCARDIOGRAM N/A 02/20/2013   Procedure: INTRAOPERATIVE TRANSESOPHAGEAL ECHOCARDIOGRAM;  Surgeon: Ivin Poot, MD;  Location: [facility];  Service: Open Heart Surgery;  Laterality: N/A;  . LEFT AND RIGHT HEART CATHETERIZATION WITH CORONARY ANGIOGRAM N/A 01/05/2013   Procedure: LEFT AND RIGHT HEART CATHETERIZATION WITH CORONARY ANGIOGRAM;  Surgeon: Blane Ohara, MD;  Location: [facility] CATH LAB;  Service: Cardiovascular;  Laterality: N/A;  . LEG SURGERY  06/2010    Dr.Lawson, for blood clott. Left leg   . NECK SURGERY  12/2010   Melanoma ; [facility]   . NOSE SURGERY     Septal Deviation  . RIGHT HEART CATHETERIZATION N/A 01/23/2013   Procedure: RIGHT HEART CATH;  Surgeon: Jolaine Artist, MD;  Location: [facility] CATH LAB;  Service: Cardiovascular;  Laterality: N/A;  . TEE WITHOUT CARDIOVERSION N/A 01/23/2013   Procedure: TRANSESOPHAGEAL ECHOCARDIOGRAM (TEE);  Surgeon: Jolaine Artist, MD;  Location: [facility] ENDOSCOPY;  Service: Cardiovascular;  Laterality: N/A;  sign heart cath consent w/tee consent    There were no vitals filed for this visit.      Subjective Assessment - 11/08/16 1359    Subjective No new complaitns. No falls or pain to report. Reports  he has not had a chance to go to orthotist re: shoe modification (needs built up shoe)   Patient is accompained by: Family member  spouse in lobby   Limitations Walking   How long can you walk comfortably? varies day to day; can walk driveway 3-4 times some days, other days only once; leg fatigue causes him to sit   Patient Stated Goals Walking better; walk without the cane (has used for 3 yrs)                         [facility] Adult PT Treatment/Exercise - 11/08/16 1421      Transfers   Sit to Stand 4: Min guard   Sit to Stand Details (indicate cue type and reason) minguard as PT called out "stop" as part way down  and "stand back up"   Number of Reps 10 reps     Ambulation/Gait   Ambulation/Gait Assistance 5: Supervision   Ambulation/Gait Assistance Details vc for heelstrike and posture   Ambulation Distance (Feet) 240 Feet   Assistive device None   Gait Pattern Step-through pattern;Decreased arm swing - right;Decreased arm swing - left;Decreased step length - right;Decreased step length - left;Shuffle;Trunk flexed;Poor foot clearance - right;Poor foot clearance - left   Ambulation Surface Level   Stairs Yes   Stairs Assistance 6: Modified independent (Device/Increase time)   Stair Management Technique One rail Right;Two rails;Alternating pattern;Forwards   Number of Stairs 12   Height of Stairs 6   Curb 6: Modified independent (Device/increase time)   Curb Details (indicate cue type and reason) no cane; 6" step; 2 reps     Posture/Postural Control   Posture/Postural Control Postural limitations   Postural Limitations Rounded Shoulders;Forward head;Increased thoracic kyphosis   Posture Comments supine chin tuck with 10 second hold; (on thin pillow and no pillow); cues ~30% of session for upright posture with much better posture today (began session with warmup and then postural exercises/stretches)     Exercises   Other Exercises  supine on mat with towel roll along spine, arms to side for chest stretch x 5 minutes (while doing other stretches/exercises)     Lumbar Exercises: Stretches   Lower Trunk Rotation 1 rep;30 seconds  emphasis on shoulders remaining on surface for ant chest      Lumbar Exercises: Quadruped   Straight Leg Raise 20 reps   Straight Leg Raises Limitations vc and tactile cues for level pelvis and leg height     Knee/Hip Exercises: Aerobic   Nustep L4 x 6 min      Knee/Hip Exercises: Standing   Hip Extension AROM;Stengthening;Both;2 sets;10 reps;Knee straight   Extension Limitations initially attempted step up onto 6 inch step with trailing leg lifting into extension  (bil UE support) however pt could not coordinate and simplified to hip extension only             Balance Exercises - 11/08/16 1444      Balance Exercises: Standing   Standing Eyes Opened Narrow base of support (BOS);Foam/compliant surface   Standing Eyes Closed Narrow base of support (BOS);Foam/compliant surface;30 secs   Tandem Stance Eyes open;Intermittent upper extremity support;Foam/compliant surface;2 reps;30 secs   SLS Eyes open;Upper extremity support 1;Foam/compliant surface;4 reps   Stepping Strategy Foam/compliant surface;Posterior   Step Ups Forward;6 inch;UE support 2             PT Short Term Goals - 10/02/16 1911      PT  SHORT TERM GOAL #1   Title Patient/spouse will demonstrate independence in HEP for balance, ROM, and strengthening. (Target date for all STGs 09/21/16)   Time 4   Period Weeks   Status Achieved     PT SHORT TERM GOAL #2   Title Patient will demonstrate improved LE strength and balance with improved 5 times sit to stand time to <17 seconds.   Baseline 10/02/16  17.5 seconds   Time 4   Period Weeks   Status Partially Met     PT SHORT TERM GOAL #3   Title Patient will improve gait velocity to >= 2.62 ft/sec (safe community ambulator velocity) while demonstrating appropriate use of assistive device.   Baseline 10/02/16 2.66 ft/sec   Time 4   Period Weeks   Status Achieved     PT SHORT TERM GOAL #4   Title Patient will improve FGA score by 3 points from his baseline to indicate a lesser risk of falling.    Baseline 08/28/16 scored 16/30 ; 10/02/16  12/30 (returned after missing several weeks of PT due to illness)   Time 4   Period Weeks   Status Not Met     PT SHORT TERM GOAL #5   Title Patient/spouse will complete education on fall prevention.    Time 4   Period Weeks   Status On-going           PT Long Term Goals - 10/24/16 1438      PT LONG TERM GOAL #1   Title Patient/spouse will demonstrate independence in updated/final  HEP for balance, ROM, and strengthening. (Target date for all LTGs 10/23/16--updated to 11/16/16 due to missed 3 weeks)   Time 8   Period Weeks   Status New     PT LONG TERM GOAL #2   Title Patient will improve TUG normal time to <13.5 seconds, indicative of a lesser risk of falling.    Time 8   Period Weeks   Status New     PT LONG TERM GOAL #3   Title Patient will improve gait velocity to >=2.86 ft/sec (10% less than normal speed for healthy 80-89 yr olds, 3.18 ft/sec).   Time 8   Period Weeks   Status New     PT LONG TERM GOAL #4   Title Patient will improve his FGA score by 2 points compared to score at 4 weeks, indicative of a lesser fall risk.   Baseline 10/04/16 16/30   Time 8   Period Weeks   Status New               Plan - 11/08/16 1607    Clinical Impression Statement Session focused on postural stretching and strengthening, balance and gait training. Patient's posture the best it has looked with pt very proud because "I've been working hard on it!" Patient continues to make progress towards LTGs with current plan to discharge 9/27.    Rehab Potential Good   Clinical Impairments Affecting Rehab Potential decreased ability to understand written materials (does have a supportive wife that will assist him)   PT Frequency 2x / week   PT Duration 8 weeks   PT Treatment/Interventions ADLs/Self Care Home Management;DME Instruction;Gait training;Stair training;Functional mobility training;Therapeutic activities;Therapeutic exercise;Balance training;Neuromuscular re-education;Patient/family education;Manual techniques;Passive range of motion   PT Next Visit Plan begin to check LTGs (plan to d/c 9/27); progress posture, LE strengthening (esp lt hip extension and abdct), high level balance and gait (without cane); obstacles, cardiovascular endurance (Nustep)  Consulted and Agree with Plan of Care Patient      Patient will benefit from skilled therapeutic intervention in  order to improve the following deficits and impairments:  Abnormal gait, Decreased balance, Decreased knowledge of use of DME, Decreased mobility, Decreased range of motion, Impaired flexibility, Postural dysfunction  Visit Diagnosis: Unsteadiness on feet  Abnormal posture  Other abnormalities of gait and mobility  Other symptoms and signs involving the musculoskeletal system     Problem List Patient Active Problem List   Diagnosis Date Noted  . Anxiety 11/05/2016  . Urinary frequency 09/12/2016  . Cough 09/12/2016  . Prediabetes 05/01/2016  . Pain in both lower extremities 05/01/2016  . Bilateral hearing loss 05/01/2016  . Migraine 04/26/2015  . Aortic valve replaced 04/21/2015  . Subacute confusional state 12/14/2014  . Dyspnea 09/16/2013  . Pulmonary infiltrates 07/31/2013  . Encounter for therapeutic drug monitoring 03/20/2013  . CAD (coronary artery disease) 03/13/2013  . Atrial fibrillation, controlled ([place]) 03/13/2013  . Protein-calorie malnutrition, severe ([place]) 02/27/2013  . S/P CABG x 3 02/20/2013  . History of embolectomy 09/22/2012  . CKD (chronic kidney disease) 09/22/2012  . Syncope 09/15/2012  . Peripheral vascular disease, unspecified ([place]) 07/08/2012  . Mitral stenosis 07/27/2010  . Hyperlipidemia 10/25/2009  . Essential hypertension 10/25/2009  . NEPHROLITHIASIS, HX OF 10/25/2009  . SLEEP DISORDER 11/29/2008  . SNORING 10/14/2008  . APNEA 10/05/2008  . SKIN CANCER, HX OF 10/05/2008  . RESTLESS LEG SYNDROME 09/11/2007  . Allergic rhinitis, cause unspecified 09/11/2007  . GOUT 03/15/2006  . ASTHMA 03/15/2006  . COPD 03/15/2006  . Osteoporosis 03/15/2006  . COLONIC POLYPS, HX OF 03/15/2006    Rexanne Mano, PT 11/08/2016, 4:12 PM  [facility] [address] [place], [place], [address] Phone: [phone]   Fax:  [phone]  Name: Robert Richard MRN: [id number] Date of Birth:  08/17/1930

## 2016-11-13 ENCOUNTER — Ambulatory Visit: Payer: Medicare Other | Admitting: Physical Therapy

## 2016-11-13 ENCOUNTER — Encounter: Payer: Self-pay | Admitting: Physical Therapy

## 2016-11-13 DIAGNOSIS — R293 Abnormal posture: Secondary | ICD-10-CM | POA: Diagnosis not present

## 2016-11-13 DIAGNOSIS — R94120 Abnormal auditory function study: Secondary | ICD-10-CM | POA: Diagnosis not present

## 2016-11-13 DIAGNOSIS — R29898 Other symptoms and signs involving the musculoskeletal system: Secondary | ICD-10-CM | POA: Diagnosis not present

## 2016-11-13 DIAGNOSIS — H93213 Auditory recruitment, bilateral: Secondary | ICD-10-CM | POA: Diagnosis not present

## 2016-11-13 DIAGNOSIS — R2689 Other abnormalities of gait and mobility: Secondary | ICD-10-CM

## 2016-11-13 DIAGNOSIS — H93299 Other abnormal auditory perceptions, unspecified ear: Secondary | ICD-10-CM | POA: Diagnosis not present

## 2016-11-13 DIAGNOSIS — H90A22 Sensorineural hearing loss, unilateral, left ear, with restricted hearing on the contralateral side: Secondary | ICD-10-CM | POA: Diagnosis not present

## 2016-11-13 DIAGNOSIS — R2681 Unsteadiness on feet: Secondary | ICD-10-CM | POA: Diagnosis not present

## 2016-11-13 DIAGNOSIS — H9312 Tinnitus, left ear: Secondary | ICD-10-CM | POA: Diagnosis not present

## 2016-11-13 DIAGNOSIS — H90A31 Mixed conductive and sensorineural hearing loss, unilateral, right ear with restricted hearing on the contralateral side: Secondary | ICD-10-CM | POA: Diagnosis not present

## 2016-11-13 NOTE — Therapy (Signed)
[facility] [address] [place], [place], [address] Phone: [phone]   Fax:  [phone]  Physical Therapy Treatment  Patient Details  Name: Robert Richard MRN: [id number] Date of Birth: 12/01/1930 Referring Provider: Dorris Carnes  Encounter Date: 11/13/2016      PT End of Session - 11/13/16 2228    Visit Number 17   Number of Visits 33   Date for PT Re-Evaluation 12/23/16   Authorization Type [employer] Medicare  G-code every 10th visit   Authorization Time Period 08/24/16 to 10/23/16; 10/23/16 to 12/23/16   PT Start Time 1445   PT Stop Time 1532   PT Time Calculation (min) 47 min      Past Medical History:  Diagnosis Date  . Allergic rhinitis   . Anemia   . Arthritis    SHOULDER  . Asthma with bronchitis   . Colon polyp   . COPD (chronic obstructive pulmonary disease) (HCC)    Astmatic bronchitis  . Dysrhythmia 08/2012   NEW ONSET ATRIAL FIBRILATION  . Gout    PMH  . Headache(784.0)   . HLD (hyperlipidemia)   . Nephrolithiasis   . Osteoporosis   . RLS (restless legs syndrome)   . Skin cancer (melanoma) ([place]) 2012   Right neck  . Skin cancer, basal cell    Dr Nevada Crane, [facility]    Past Surgical History:  Procedure Laterality Date  . AORTIC VALVE REPLACEMENT N/A 02/20/2013   Procedure: AORTIC VALVE REPLACEMENT (AVR);  Surgeon: Ivin Poot, MD;  Location: [facility];  Service: Open Heart Surgery;  Laterality: N/A;  . COLONOSCOPY W/ POLYPECTOMY     Dr Deatra Ina  . CORONARY ARTERY BYPASS GRAFT N/A 02/20/2013   Procedure: CORONARY ARTERY BYPASS GRAFTING (CABG);  Surgeon: Ivin Poot, MD;  Location: [place] [facility];  Service: Open Heart Surgery;  Laterality: N/A;  . FINGER SURGERY     for foreign body  . INGUINAL HERNIA REPAIR    . INTRAOPERATIVE TRANSESOPHAGEAL ECHOCARDIOGRAM N/A 02/20/2013   Procedure: INTRAOPERATIVE TRANSESOPHAGEAL ECHOCARDIOGRAM;  Surgeon: Ivin Poot, MD;  Location: [facility];  Service: Open Heart Surgery;   Laterality: N/A;  . LEFT AND RIGHT HEART CATHETERIZATION WITH CORONARY ANGIOGRAM N/A 01/05/2013   Procedure: LEFT AND RIGHT HEART CATHETERIZATION WITH CORONARY ANGIOGRAM;  Surgeon: Blane Ohara, MD;  Location: [facility] CATH LAB;  Service: Cardiovascular;  Laterality: N/A;  . LEG SURGERY  06/2010    Dr.Lawson, for blood clott. Left leg   . NECK SURGERY  12/2010   Melanoma ; [facility]   . NOSE SURGERY     Septal Deviation  . RIGHT HEART CATHETERIZATION N/A 01/23/2013   Procedure: RIGHT HEART CATH;  Surgeon: Jolaine Artist, MD;  Location: [facility] CATH LAB;  Service: Cardiovascular;  Laterality: N/A;  . TEE WITHOUT CARDIOVERSION N/A 01/23/2013   Procedure: TRANSESOPHAGEAL ECHOCARDIOGRAM (TEE);  Surgeon: Jolaine Artist, MD;  Location: [facility] ENDOSCOPY;  Service: Cardiovascular;  Laterality: N/A;  sign heart cath consent w/tee consent    There were no vitals filed for this visit.      Subjective Assessment - 11/13/16 2222    Subjective Busy getting ready for grandaughter's wedding.    Patient is accompained by: Family member   Patient Stated Goals Walking better; walk without the cane (has used for 3 yrs)   Currently in Pain? No/denies            [facility] PT Assessment - 11/13/16 1503  Functional Gait  Assessment   Gait assessed  Yes   Gait Level Surface Walks 20 ft, slow speed, abnormal gait pattern, evidence for imbalance or deviates 10-15 in outside of the 12 in walkway width. Requires more than 7 sec to ambulate 20 ft.   Change in Gait Speed Able to change speed, demonstrates mild gait deviations, deviates 6-10 in outside of the 12 in walkway width, or no gait deviations, unable to achieve a major change in velocity, or uses a change in velocity, or uses an assistive device.  8.12   Gait with Horizontal Head Turns Performs head turns smoothly with slight change in gait velocity (eg, minor disruption to smooth gait path), deviates 6-10 in outside 12 in walkway width, or uses an  assistive device.   Gait with Vertical Head Turns Performs task with slight change in gait velocity (eg, minor disruption to smooth gait path), deviates 6 - 10 in outside 12 in walkway width or uses assistive device   Gait and Pivot Turn Pivot turns safely in greater than 3 sec and stops with no loss of balance, or pivot turns safely within 3 sec and stops with mild imbalance, requires small steps to catch balance.   Step Over Obstacle Is able to step over 2 stacked shoe boxes taped together (9 in total height) without changing gait speed. No evidence of imbalance.   Gait with Narrow Base of Support Ambulates 7-9 steps.   Gait with Eyes Closed Walks 20 ft, no assistive devices, good speed, no evidence of imbalance, normal gait pattern, deviates no more than 6 in outside 12 in walkway width. Ambulates 20 ft in less than 7 sec.   Ambulating Backwards Walks 20 ft, uses assistive device, slower speed, mild gait deviations, deviates 6-10 in outside 12 in walkway width.   Steps Alternating feet, must use rail.   Total Score 21                     [facility] Adult PT Treatment/Exercise - 11/13/16 2222      Ambulation/Gait   Gait velocity 12.94 secs = 2.53 ft/sec     Standardized Balance Assessment   Standardized Balance Assessment Timed Up and Go Test     Timed Up and Go Test   TUG Normal TUG   Normal TUG (seconds) 12.2     Knee/Hip Exercises: Aerobic   Nustep Level 4 x 5"     Knee/Hip Exercises: Standing   Heel Raises Both;1 set;10 reps   Hip Flexion Both;1 set;10 reps  3# weight on each leg   Hip Abduction Stengthening;Both;1 set;10 reps  3# weight on each leg   Hip Extension Stengthening;Both;1 set;10 reps  33 weight   Forward Step Up Both;1 set;10 reps;Step Height: 6"                  PT Short Term Goals - 10/02/16 1911      PT SHORT TERM GOAL #1   Title Patient/spouse will demonstrate independence in HEP for balance, ROM, and strengthening. (Target date for  all STGs 09/21/16)   Time 4   Period Weeks   Status Achieved     PT SHORT TERM GOAL #2   Title Patient will demonstrate improved LE strength and balance with improved 5 times sit to stand time to <17 seconds.   Baseline 10/02/16  17.5 seconds   Time 4   Period Weeks   Status Partially Met     PT SHORT  TERM GOAL #3   Title Patient will improve gait velocity to >= 2.62 ft/sec (safe community ambulator velocity) while demonstrating appropriate use of assistive device.   Baseline 10/02/16 2.66 ft/sec   Time 4   Period Weeks   Status Achieved     PT SHORT TERM GOAL #4   Title Patient will improve FGA score by 3 points from his baseline to indicate a lesser risk of falling.    Baseline 08/28/16 scored 16/30 ; 10/02/16  12/30 (returned after missing several weeks of PT due to illness)   Time 4   Period Weeks   Status Not Met     PT SHORT TERM GOAL #5   Title Patient/spouse will complete education on fall prevention.    Time 4   Period Weeks   Status On-going           PT Long Term Goals - 11/13/16 1632      PT LONG TERM GOAL #1   Title Patient/spouse will demonstrate independence in updated/final HEP for balance, ROM, and strengthening. (Target date for all LTGs 10/23/16--updated to 11/16/16 due to missed 3 weeks)     PT LONG TERM GOAL #2   Title Patient will improve TUG normal time to <13.5 seconds, indicative of a lesser risk of falling.    Baseline 12.2 secs - 11-13-16   Status Achieved     PT LONG TERM GOAL #3   Title Patient will improve gait velocity to >=2.86 ft/sec (10% less than normal speed for healthy 80-89 yr olds, 3.18 ft/sec).   Baseline 12.94 secs = 2.53 ft/sec without device   Status Not Met     PT LONG TERM GOAL #4   Title Patient will improve his FGA score by 2 points compared to score at 4 weeks, indicative of a lesser fall risk.   Baseline 10/04/16 16/30; 21/30 on 11-13-16   Status Achieved               Plan - 11/13/16 2229    Clinical  Impression Statement Pt has met LTG's #2 and 4:  #3 not met due to slow gait speed with gait velocity not achieved per stated goalL  FGA score has increased from 16/30 to 21/30   Rehab Potential Good   Clinical Impairments Affecting Rehab Potential decreased ability to understand written materials (does have a supportive wife that will assist him)   PT Frequency 2x / week   PT Duration 8 weeks   PT Treatment/Interventions ADLs/Self Care Home Management;DME Instruction;Gait training;Stair training;Functional mobility training;Therapeutic activities;Therapeutic exercise;Balance training;Neuromuscular re-education;Patient/family education;Manual techniques;Passive range of motion   PT Next Visit Plan Check LTG #1 - D/C next session?   Consulted and Agree with Plan of Care Patient      Patient will benefit from skilled therapeutic intervention in order to improve the following deficits and impairments:  Abnormal gait, Decreased balance, Decreased knowledge of use of DME, Decreased mobility, Decreased range of motion, Impaired flexibility, Postural dysfunction  Visit Diagnosis: Other abnormalities of gait and mobility  Unsteadiness on feet     Problem List Patient Active Problem List   Diagnosis Date Noted  . Anxiety 11/05/2016  . Urinary frequency 09/12/2016  . Cough 09/12/2016  . Prediabetes 05/01/2016  . Pain in both lower extremities 05/01/2016  . Bilateral hearing loss 05/01/2016  . Migraine 04/26/2015  . Aortic valve replaced 04/21/2015  . Subacute confusional state 12/14/2014  . Dyspnea 09/16/2013  . Pulmonary infiltrates 07/31/2013  . Encounter for  therapeutic drug monitoring 03/20/2013  . CAD (coronary artery disease) 03/13/2013  . Atrial fibrillation, controlled ([place]) 03/13/2013  . Protein-calorie malnutrition, severe ([place]) 02/27/2013  . S/P CABG x 3 02/20/2013  . History of embolectomy 09/22/2012  . CKD (chronic kidney disease) 09/22/2012  . Syncope 09/15/2012  .  Peripheral vascular disease, unspecified ([place]) 07/08/2012  . Mitral stenosis 07/27/2010  . Hyperlipidemia 10/25/2009  . Essential hypertension 10/25/2009  . NEPHROLITHIASIS, HX OF 10/25/2009  . SLEEP DISORDER 11/29/2008  . SNORING 10/14/2008  . APNEA 10/05/2008  . SKIN CANCER, HX OF 10/05/2008  . RESTLESS LEG SYNDROME 09/11/2007  . Allergic rhinitis, cause unspecified 09/11/2007  . GOUT 03/15/2006  . ASTHMA 03/15/2006  . COPD 03/15/2006  . Osteoporosis 03/15/2006  . COLONIC POLYPS, HX OF 03/15/2006    Alda Lea, PT 11/13/2016, 10:33 PM  [facility] [address] [place], [place], [address] Phone: [phone]   Fax:  [phone]  Name: Robert Richard MRN: [id number] Date of Birth: [birth year]

## 2016-11-15 ENCOUNTER — Ambulatory Visit: Payer: Medicare Other | Admitting: Physical Therapy

## 2016-11-15 ENCOUNTER — Encounter: Payer: Self-pay | Admitting: Physical Therapy

## 2016-11-15 DIAGNOSIS — R29898 Other symptoms and signs involving the musculoskeletal system: Secondary | ICD-10-CM | POA: Diagnosis not present

## 2016-11-15 DIAGNOSIS — R293 Abnormal posture: Secondary | ICD-10-CM | POA: Diagnosis not present

## 2016-11-15 DIAGNOSIS — H90A31 Mixed conductive and sensorineural hearing loss, unilateral, right ear with restricted hearing on the contralateral side: Secondary | ICD-10-CM | POA: Diagnosis not present

## 2016-11-15 DIAGNOSIS — H93213 Auditory recruitment, bilateral: Secondary | ICD-10-CM | POA: Diagnosis not present

## 2016-11-15 DIAGNOSIS — R2681 Unsteadiness on feet: Secondary | ICD-10-CM | POA: Diagnosis not present

## 2016-11-15 DIAGNOSIS — R2689 Other abnormalities of gait and mobility: Secondary | ICD-10-CM

## 2016-11-15 DIAGNOSIS — R94120 Abnormal auditory function study: Secondary | ICD-10-CM | POA: Diagnosis not present

## 2016-11-15 DIAGNOSIS — H93299 Other abnormal auditory perceptions, unspecified ear: Secondary | ICD-10-CM | POA: Diagnosis not present

## 2016-11-15 DIAGNOSIS — H90A22 Sensorineural hearing loss, unilateral, left ear, with restricted hearing on the contralateral side: Secondary | ICD-10-CM | POA: Diagnosis not present

## 2016-11-15 DIAGNOSIS — H9312 Tinnitus, left ear: Secondary | ICD-10-CM | POA: Diagnosis not present

## 2016-11-15 NOTE — Patient Instructions (Addendum)
Hamstring Stretch, Seated (Strap, Two Chairs)    Sit with one leg extended onto facing chair. Loop strap over outstretched foot at ball of big toe. Lengthen spine. Hold for __15__ breaths. Repeat __2__ times each leg.  Copyright  VHI. All rights reserved.   Calf / Gastoc: Runners' Stretch I    One leg back and straight, other forward and bent supporting weight, lean forward, gently stretching calf of back leg. Hold _15___ seconds. Repeat with other leg. Repeat ___2_ times. Do _2-3___ sessions per day.  Copyright  VHI. All rights reserved.     LOOK FORWARD and [place]    "I love a Parade" Lift   At counter for balance as needed: high knee marching forward and then backward. 3 second pauses with each knee lift.  Repeat 3 laps each way. Do _1-2_ sessions per day. http://gt2.exer.us/345     Copyright  VHI. All rights reserved.  Walking on Heels   At counter: Walk on heels forward while continuing on a straight path, and then walk on heels backward to starting position. Repeat for 3 laps each way. Do _1-2__ sessions per day.  Copyright  VHI. All rights reserved.  Walking on Toes   At counter for balance as needed: Walk on toes forward while continuing on a straight path, and then backwards on toes to starting position. Repeat 3 laps each way. Do _1-2___ sessions per day.  Copyright  VHI. All rights reserved.  Feet Heel-Toe "Tandem"   At counter: Arms at sides, walk a straight line forward bringing one foot directly in front of the other, and then a straight line backwards bringing one foot directly behind the other one.  Repeat for _3 laps each way. Do _1-2_ sessions per day.    Bridging: with Straight Leg Raise    With legs bent, lift buttocks off the bed. Then slowly extend right knee, keeping stomach tight. Put right foot down and straighten left knee out. Put left foot down and lower hips to bed.  Repeat __10__ times per set. Do __2__ sets  per session. Do _1___ sessions per day.  http://orth.exer.us/1104   Copyright  VHI. All rights reserved.   Thoracic Self-Mobilization (Supine)    With rolled towel placed lengthwise between your shoulder blades, lie back on towel with arms outstretched. Hold ___2 minutes. Relax. Repeat __2__ times per set. Do __1__ sets per session. http://orth.exer.us/1000   Copyright  VHI. All rights reserved.     Hip Backward Kick    Using the counter for balance, keep legs shoulder width apart and toes pointed forward. Slowly extend one leg back, keeping knee straight. Do not lean forward. Repeat with other leg. Repeat __20__ times. Do ___1_ sessions per day.   Suboccipital Stretch (Supine)    With small pillow or folded towel at base of skull and upper neck. Gently tuck chin until stretch is felt at base of skull and upper neck. Hold __5__ seconds. Relax. Repeat __10__ times per set. Do __1__ sets per session. Do __1__ sessions per day.   HIP: Flexors - Supine    Lie on edge of surface. Place leg off the surface, allow knee to bend. Bring other knee toward chest. Hold 5-10 seconds. 2-3 reps per set, 1-2 sets per day, 7 days per week Rest lowered foot on stool.  Copyright  VHI. All rights reserved.  Chin Tuck    Tuck chin in, then down. Hold 20 seconds. Relax. Repeat 2-3 times. Do 2-3 sessions per  day.  http://gt2.exer.us/836   Copyright  VHI. All rights reserved.  CHEST: Doorway, Unilateral - Standing    Standing in doorway, place one hand on top of doorway with elbow straight. . Hold 2 breaths. Repeat other arm, then both arms overhead Perform every time you leave the restroom at home   Prior to exercise, put chair next to counter. Sit and place green band around your ankles. Stand and face the counter with hands on counter.    Stand, feet flat. Against resistance band, lift right leg out to side.Return and lift left leg out to side.  Complete _2__ sets of  _10__ repetitions. Perform _1__ sessions per day.   Shoulder Blade Squeeze    Rotate shoulders back, then squeeze shoulder blades together. Hold for 30 seconds Repeat __5__ times. Do __2__ sessions per day. (and every time you find yourself slouching)

## 2016-11-15 NOTE — Therapy (Signed)
[facility] [address] [place], [place], [address] Phone: [phone]   Fax:  [phone]  Physical Therapy Treatment and Discharge Summary  Patient Details  Name: Robert Richard MRN: [id number] Date of Birth: [birth year] Referring Provider: Dorris Carnes  Encounter Date: 11/15/2016      PT End of Session - 11/15/16 1731    Visit Number 18   Number of Visits 33   Date for PT Re-Evaluation 12/23/16   Authorization Type [employer] Medicare  G-code every 10th visit   Authorization Time Period 08/24/16 to 10/23/16; 10/23/16 to 12/23/16   PT Start Time 1445   PT Stop Time 1525   PT Time Calculation (min) 40 min   Activity Tolerance Patient tolerated treatment well   Behavior During Therapy [facility] for tasks assessed/performed      Past Medical History:  Diagnosis Date  . Allergic rhinitis   . Anemia   . Arthritis    SHOULDER  . Asthma with bronchitis   . Colon polyp   . COPD (chronic obstructive pulmonary disease) (HCC)    Astmatic bronchitis  . Dysrhythmia 08/2012   NEW ONSET ATRIAL FIBRILATION  . Gout    PMH  . Headache(784.0)   . HLD (hyperlipidemia)   . Nephrolithiasis   . Osteoporosis   . RLS (restless legs syndrome)   . Skin cancer (melanoma) ([place]) 2012   Right neck  . Skin cancer, basal cell    Dr Nevada Crane, [facility]    Past Surgical History:  Procedure Laterality Date  . AORTIC VALVE REPLACEMENT N/A 02/20/2013   Procedure: AORTIC VALVE REPLACEMENT (AVR);  Surgeon: Ivin Poot, MD;  Location: [facility];  Service: Open Heart Surgery;  Laterality: N/A;  . COLONOSCOPY W/ POLYPECTOMY     Dr Deatra Ina  . CORONARY ARTERY BYPASS GRAFT N/A 02/20/2013   Procedure: CORONARY ARTERY BYPASS GRAFTING (CABG);  Surgeon: Ivin Poot, MD;  Location: [facility];  Service: Open Heart Surgery;  Laterality: N/A;  . FINGER SURGERY     for foreign body  . INGUINAL HERNIA REPAIR    . INTRAOPERATIVE TRANSESOPHAGEAL ECHOCARDIOGRAM N/A 02/20/2013   Procedure: INTRAOPERATIVE TRANSESOPHAGEAL ECHOCARDIOGRAM;  Surgeon: Ivin Poot, MD;  Location: [facility];  Service: Open Heart Surgery;  Laterality: N/A;  . LEFT AND RIGHT HEART CATHETERIZATION WITH CORONARY ANGIOGRAM N/A 01/05/2013   Procedure: LEFT AND RIGHT HEART CATHETERIZATION WITH CORONARY ANGIOGRAM;  Surgeon: Blane Ohara, MD;  Location: [facility] CATH LAB;  Service: Cardiovascular;  Laterality: N/A;  . LEG SURGERY  06/2010    Dr.Lawson, for blood clott. Left leg   . NECK SURGERY  12/2010   Melanoma ; [facility]   . NOSE SURGERY     Septal Deviation  . RIGHT HEART CATHETERIZATION N/A 01/23/2013   Procedure: RIGHT HEART CATH;  Surgeon: Jolaine Artist, MD;  Location: [facility] CATH LAB;  Service: Cardiovascular;  Laterality: N/A;  . TEE WITHOUT CARDIOVERSION N/A 01/23/2013   Procedure: TRANSESOPHAGEAL ECHOCARDIOGRAM (TEE);  Surgeon: Jolaine Artist, MD;  Location: [facility] ENDOSCOPY;  Service: Cardiovascular;  Laterality: N/A;  sign heart cath consent w/tee consent    There were no vitals filed for this visit.      Subjective Assessment - 11/15/16 1447    Subjective Feeling good.  States he has been helped so much by the therapy. When he started he had bil leg pain below the knees and now he has no pain.    Patient is accompained by: Family member  Patient Stated Goals Walking better; walk without the cane (has used for 3 yrs)   Currently in Pain? No/denies                         [facility] Adult PT Treatment/Exercise - 11/15/16 1533      Transfers   Sit to Stand 6: Modified independent (Device/Increase time)   Sit to Stand Details (indicate cue type and reason) with cane     Ambulation/Gait   Ambulation/Gait Yes   Ambulation/Gait Assistance 6: Modified independent (Device/Increase time)     Therapeutic Exercise- Patient performed 2-10 reps of the following exercises from his current HEP (fewer reps if no corrections to technique needed; higher reps if corrections  needed):  Stand heel cord stretch Toe walking forwards  Toe walking backwards attempted and removed from HEP due to significant imbalance Heel walking forwards Heel walking backwards attempted and removed from HEP due to significant imbalance Tandem walking  Standing marching at counter Standing hip abduction Standing hip extension Standing scapular adduction Standing chin tuck  Discussed patient's plans for continued activity upon discharge and pt appears highly motivated to continue his exercises from PT and walking. Reviewed information re: walking program.             PT Education - 11/15/16 1730    Education provided Yes   Education Details reviewed and edited final HEP (see pt instructions); continue walking for exercise; results of FOTO questionnaire   Person(s) Educated Patient   Methods Explanation;Demonstration;Verbal cues;Handout   Comprehension Verbalized understanding;Returned demonstration;Verbal cues required          PT Short Term Goals - 10/02/16 1911      PT SHORT TERM GOAL #1   Title Patient/spouse will demonstrate independence in HEP for balance, ROM, and strengthening. (Target date for all STGs 09/21/16)   Time 4   Period Weeks   Status Achieved     PT SHORT TERM GOAL #2   Title Patient will demonstrate improved LE strength and balance with improved 5 times sit to stand time to <17 seconds.   Baseline 10/02/16  17.5 seconds   Time 4   Period Weeks   Status Partially Met     PT SHORT TERM GOAL #3   Title Patient will improve gait velocity to >= 2.62 ft/sec (safe community ambulator velocity) while demonstrating appropriate use of assistive device.   Baseline 10/02/16 2.66 ft/sec   Time 4   Period Weeks   Status Achieved     PT SHORT TERM GOAL #4   Title Patient will improve FGA score by 3 points from his baseline to indicate a lesser risk of falling.    Baseline 08/28/16 scored 16/30 ; 10/02/16  12/30 (returned after missing several weeks of  PT due to illness)   Time 4   Period Weeks   Status Not Met     PT SHORT TERM GOAL #5   Title Patient/spouse will complete education on fall prevention.    Time 4   Period Weeks   Status On-going           PT Long Term Goals - 11/15/16 1733      PT LONG TERM GOAL #1   Title Patient/spouse will demonstrate independence in updated/final HEP for balance, ROM, and strengthening. (Target date for all LTGs 10/23/16--updated to 11/16/16 due to missed 3 weeks)   Baseline 11/15/16 Patient required vc for marching, standing heel cord stretch, hip extension exercises (  all other exercises in HEP he performed correctly)   Time 8   Period Weeks   Status Partially Met     PT LONG TERM GOAL #2   Title Patient will improve TUG normal time to <13.5 seconds, indicative of a lesser risk of falling.    Baseline 12.2 secs - 11-13-16   Status Achieved     PT LONG TERM GOAL #3   Title Patient will improve gait velocity to >=2.86 ft/sec (10% less than normal speed for healthy 80-89 yr olds, 3.18 ft/sec).   Baseline 12.94 secs = 2.53 ft/sec without device   Status Partially Met     PT LONG TERM GOAL #4   Title Patient will improve his FGA score by 2 points compared to score at 4 weeks, indicative of a lesser fall risk.   Baseline 10/04/16 16/30; 21/30 on 11-13-16   Status Achieved               Plan - 2016-11-18 1736    Clinical Impression Statement Remainder of LTGs assessed with pt ultimately achieving 2 of 4 goals with improvement/progress towards remaining 2 goals, but not to level set for goal. Patient very please with his progress and how he feels. Discharge from PT.    Rehab Potential Good   Clinical Impairments Affecting Rehab Potential decreased ability to understand written materials (does have a supportive wife that will assist him)   Consulted and Agree with Plan of Care Patient      Patient will benefit from skilled therapeutic intervention in order to improve the following  deficits and impairments:     Visit Diagnosis: Other abnormalities of gait and mobility  Unsteadiness on feet  Abnormal posture  Other symptoms and signs involving the musculoskeletal system       G-Codes - November 18, 2016 1741    Functional Assessment Tool Used (Outpatient Only) TUG 12.2, gait velocity 2.53 ft/sec,    Functional Limitation Mobility: Walking and moving around   Mobility: Walking and Moving Around Goal Status [phone]) At least 1 percent but less than 20 percent impaired, limited or restricted   Mobility: Walking and Moving Around Discharge Status [phone]) At least 1 percent but less than 20 percent impaired, limited or restricted      Problem List Patient Active Problem List   Diagnosis Date Noted  . Anxiety 11/05/2016  . Urinary frequency 09/12/2016  . Cough 09/12/2016  . Prediabetes 05/01/2016  . Pain in both lower extremities 05/01/2016  . Bilateral hearing loss 05/01/2016  . Migraine 04/26/2015  . Aortic valve replaced 04/21/2015  . Subacute confusional state 12/14/2014  . Dyspnea 09/16/2013  . Pulmonary infiltrates 07/31/2013  . Encounter for therapeutic drug monitoring 03/20/2013  . CAD (coronary artery disease) 03/13/2013  . Atrial fibrillation, controlled ([place]) 03/13/2013  . Protein-calorie malnutrition, severe ([place]) 02/27/2013  . S/P CABG x 3 02/20/2013  . History of embolectomy 09/22/2012  . CKD (chronic kidney disease) 09/22/2012  . Syncope 09/15/2012  . Peripheral vascular disease, unspecified ([place]) 07/08/2012  . Mitral stenosis 07/27/2010  . Hyperlipidemia 10/25/2009  . Essential hypertension 10/25/2009  . NEPHROLITHIASIS, HX OF 10/25/2009  . SLEEP DISORDER 11/29/2008  . SNORING 10/14/2008  . APNEA 10/05/2008  . SKIN CANCER, HX OF 10/05/2008  . RESTLESS LEG SYNDROME 09/11/2007  . Allergic rhinitis, cause unspecified 09/11/2007  . GOUT 03/15/2006  . ASTHMA 03/15/2006  . COPD 03/15/2006  . Osteoporosis 03/15/2006  . COLONIC POLYPS, HX OF  03/15/2006   PHYSICAL THERAPY DISCHARGE SUMMARY  Visits from Start of Care: 18  Current functional level related to goals / functional outcomes: See above   Remaining deficits: Patient continues to relax into poor posture with forward head and rounded shoulders eventhough he can correct this. With poor posture he demonstrates worse balance   Education / Equipment: See HEP; provided prescription from his MD for shoe modification due to detected leg length discrepancy. Provided name, address, and telephone number of the 2 orthotist offices in town.   Plan: Patient agrees to discharge.  Patient goals were partially met. Patient is being discharged due to being pleased with the current functional level.  ?????       Rexanne Mano , PT 11/15/2016, 5:42 PM  [facility] [address] [place], [place], [address] Phone: [phone]   Fax:  [phone]  Name: Robert Richard MRN: [id number] Date of Birth: 1930/09/06

## 2016-11-19 ENCOUNTER — Ambulatory Visit (INDEPENDENT_AMBULATORY_CARE_PROVIDER_SITE_OTHER): Payer: Medicare Other | Admitting: Neurology

## 2016-11-19 ENCOUNTER — Encounter: Payer: Self-pay | Admitting: Neurology

## 2016-11-19 VITALS — BP 120/70 | HR 139 | Ht 73.0 in | Wt 136.0 lb

## 2016-11-19 DIAGNOSIS — M79605 Pain in left leg: Secondary | ICD-10-CM | POA: Diagnosis not present

## 2016-11-19 DIAGNOSIS — M79604 Pain in right leg: Secondary | ICD-10-CM

## 2016-11-19 DIAGNOSIS — R634 Abnormal weight loss: Secondary | ICD-10-CM

## 2016-11-19 NOTE — Patient Instructions (Addendum)
1.  Continue gabapentin 100mg  twice daily 2.  Talk to Dr. Domingo Cocking about possibly reducing your topiramate because of your weight loss  Return to clinic in July 2019

## 2016-11-19 NOTE — Progress Notes (Signed)
Follow-up Visit   Date: 11/19/16    Robert Richard MRN: [id number] DOB: Feb 04, 1931   Interim History: Robert Richard is a 83 y.o. right-handed Caucasian male with CAD s/p CABG, PAF, PVD, HPL, HTN, RLS, anemia, asthma, COPD, history of cerebellar stroke (2015), and CKD returning to the clinic for follow-up of bilateral leg pain.  The patient was accompanied to the clinic by wife who also provides collateral information.    History of present illness: For the past 5-10 years, he had had dull achy pain of the lower legs from the knee down.  He denies numbness, burning, stabbing, or tingling. Pain is present at rest and with activity and there is nothing that makes it better.  Sometimes, walking can help but not always. It is worse during the daytime.  He does not have discomfort such as crawling sensation of the legs. There is no muscle tenderness.  He has arterial studies of the legs which were normal.  His has some imbalance and has been walking with a cane for the past 3 years.  He has not suffered any recent falls.  He also has generalized weakness of the arm and legs.  He was much more active prior to his CABG, but since this time, he has become sedentary.  UPDATE 11/19/2016:  He is here for follow-up visit.  He is very pleased with the relief he gets with gabapentin, because since starting gabapentin 100mg  twice daily, his leg pain has completely resolved.  He has lost 14lb in the past several months and despite him trying to eat more calories, his weight continues to decline.  Review of his medication shows topiramate 75mg  daily which he takes for migraines for the past 2-3 years written by Dr. Domingo Cocking.  Fortunately, his migraines are extremely well-controlled.  Medications:  Current Outpatient Prescriptions on File Prior to Visit  Medication Sig Dispense Refill  . busPIRone (BUSPAR) 10 MG tablet Take 2 tablets (20 mg total) by mouth 2 (two) times daily. 120 tablet 0  . diltiazem  (CARDIZEM CD) 120 MG 24 hr capsule TAKE 1 CAPSULE (120 MG TOTAL) BY MOUTH DAILY. TAKE IN ADDITION TO 240MG  DAILY 90 capsule 2  . diltiazem (CARDIZEM CD) 240 MG 24 hr capsule Take 1 capsule (240 mg total) by mouth daily. 90 capsule 2  . gabapentin (NEURONTIN) 100 MG capsule Take 1 tablet at bedtime x 1 week, then increase to 1 tablet twice daily 180 capsule 3  . ketotifen (ZADITOR) 0.025 % ophthalmic solution Place 1 drop into both eyes daily.     . Lidocaine HCl 4 % SOLN Apply 4 mLs topically as needed (for migraines).     . Multiple Vitamins-Minerals (DAILY MULTIVITAMIN) CAPS Take 1 capsule by mouth daily.     . nitroGLYCERIN (NITROSTAT) 0.4 MG SL tablet Place 0.4 mg under the tongue every 5 (five) minutes as needed for chest pain.    . polyethylene glycol (MIRALAX / GLYCOLAX) packet Take 17 g by mouth daily. 14 each 0  . pravastatin (PRAVACHOL) 40 MG tablet TAKE ONE (1) TABLET EACH DAY 90 tablet 0  . ranitidine (ZANTAC) 150 MG tablet TAKE ONE TABLET BY MOUTH TWICE A DAY 180 tablet 1  . topiramate (TOPAMAX) 25 MG tablet Take 75 mg by mouth daily.    Marland Kitchen warfarin (COUMADIN) 2 MG tablet TAKE 1 TABLET TO 1 AND 1/2 TABLETS DAILYOR AS DIRECTED BY COUMADIN CLINIC 40 tablet 3   No current facility-administered medications on file  prior to visit.     Allergies:  Allergies  Allergen Reactions  . Ivp Dye [Iodinated Diagnostic Agents] Hives    Other reaction(s): RASH    Review of Systems:  CONSTITUTIONAL: No fevers, chills, night sweats, +weight loss.  EYES: No visual changes or eye pain ENT: No hearing changes.  No history of nose bleeds.   RESPIRATORY: No cough, wheezing and shortness of breath.   CARDIOVASCULAR: Negative for chest pain, and palpitations.   GI: Negative for abdominal discomfort, blood in stools or black stools.  No recent change in bowel habits.   GU:  No history of incontinence.   MUSCLOSKELETAL: No history of joint pain or swelling.  No myalgias.   SKIN: Negative for lesions,  rash, and itching.   ENDOCRINE: Negative for cold or heat intolerance, polydipsia or goiter.   PSYCH:  No depression or anxiety symptoms.   NEURO: As Above.   Vital Signs:  BP 120/70   Pulse (!) 139   Ht 6\' 1"  (1.854 m)   Wt 136 lb (61.7 kg)   SpO2 98%   BMI 17.94 kg/m    General: Thin appearing, comfortable  Neurological Exam: MENTAL STATUS including orientation to time, place, person, recent and remote memory, attention span and concentration, language, and fund of knowledge is normal.  Speech is not dysarthric.  CRANIAL NERVES:  Face is symmetric.   MOTOR:  Motor strength is 5/5 in all extremities.  Generalized loss of muscle bulk throughout.   MSRs:  Reflexes are 2+/4 throughout, except 1+/4 at the ankles bilaterally.  SENSORY:  Vibration is reduced at the ankles bilaterally.  COORDINATION/GAIT:   Gait mildly unsteady, assisted with cane.  Data: Vascular studies of the legs 06/01/2016: No evidence of segmental lower extremity arterial disease at rest, bilaterally. ABI's are inaccurate; non compressible arteries due to medial calcifications. Abnormal right great toe pressure. Normal left great toe pressure  IMPRESSION/PLAN: 1.  Bilateral achy leg pain, responsive to gabapentin.  He denies numbness, tingling, or shooting pain makes neuropathy unlikely.  Atypical RLS is another possibility.  Fortunately, he has marked relief of symptoms with low dose gabapentin 100mg  BID, which will be continued.    2.  Unintentional weight loss.  Encouraged him to discuss with Dr. Domingo Cocking about reducing his topiramate as this has know side effect of weight loss and his headaches are very well-controlled.     Return to clinic in 1 year  The duration of this appointment visit was 20 minutes of face-to-face time with the patient.  Greater than 50% of this time was spent in counseling, explanation of diagnosis, planning of further management, and coordination of care.   Thank you for  allowing me to participate in patient's care.  If I can answer any additional questions, I would be pleased to do so.    Sincerely,    Dajae Kizer K. Posey Pronto, DO

## 2016-11-20 DIAGNOSIS — D485 Neoplasm of uncertain behavior of skin: Secondary | ICD-10-CM | POA: Diagnosis not present

## 2016-11-20 DIAGNOSIS — L089 Local infection of the skin and subcutaneous tissue, unspecified: Secondary | ICD-10-CM | POA: Diagnosis not present

## 2016-12-03 ENCOUNTER — Other Ambulatory Visit: Payer: Self-pay | Admitting: Internal Medicine

## 2016-12-06 ENCOUNTER — Ambulatory Visit (INDEPENDENT_AMBULATORY_CARE_PROVIDER_SITE_OTHER): Payer: Medicare Other | Admitting: *Deleted

## 2016-12-06 DIAGNOSIS — Z5181 Encounter for therapeutic drug level monitoring: Secondary | ICD-10-CM | POA: Diagnosis not present

## 2016-12-06 DIAGNOSIS — I4891 Unspecified atrial fibrillation: Secondary | ICD-10-CM | POA: Diagnosis not present

## 2016-12-06 DIAGNOSIS — Z952 Presence of prosthetic heart valve: Secondary | ICD-10-CM | POA: Diagnosis not present

## 2016-12-06 LAB — POCT INR: INR: 1.9

## 2016-12-12 ENCOUNTER — Ambulatory Visit (INDEPENDENT_AMBULATORY_CARE_PROVIDER_SITE_OTHER): Payer: Medicare Other

## 2016-12-12 DIAGNOSIS — Z23 Encounter for immunization: Secondary | ICD-10-CM | POA: Diagnosis not present

## 2016-12-25 DIAGNOSIS — D225 Melanocytic nevi of trunk: Secondary | ICD-10-CM | POA: Diagnosis not present

## 2016-12-25 DIAGNOSIS — C44519 Basal cell carcinoma of skin of other part of trunk: Secondary | ICD-10-CM | POA: Diagnosis not present

## 2016-12-25 DIAGNOSIS — D485 Neoplasm of uncertain behavior of skin: Secondary | ICD-10-CM | POA: Diagnosis not present

## 2017-01-02 DIAGNOSIS — D485 Neoplasm of uncertain behavior of skin: Secondary | ICD-10-CM | POA: Diagnosis not present

## 2017-01-02 DIAGNOSIS — D045 Carcinoma in situ of skin of trunk: Secondary | ICD-10-CM | POA: Diagnosis not present

## 2017-01-02 DIAGNOSIS — L98499 Non-pressure chronic ulcer of skin of other sites with unspecified severity: Secondary | ICD-10-CM | POA: Diagnosis not present

## 2017-01-03 ENCOUNTER — Ambulatory Visit (INDEPENDENT_AMBULATORY_CARE_PROVIDER_SITE_OTHER): Payer: Medicare Other

## 2017-01-03 DIAGNOSIS — Z952 Presence of prosthetic heart valve: Secondary | ICD-10-CM

## 2017-01-03 DIAGNOSIS — I4891 Unspecified atrial fibrillation: Secondary | ICD-10-CM

## 2017-01-03 DIAGNOSIS — Z5181 Encounter for therapeutic drug level monitoring: Secondary | ICD-10-CM

## 2017-01-03 LAB — POCT INR: INR: 2.4

## 2017-01-03 NOTE — Patient Instructions (Signed)
Continue taking 1 tablet daily except 1.5 tablets on Mondays. Recheck in 4 weeks. Call us with any medication changes or concerns # [phone] [facility] # [phone].

## 2017-01-08 DIAGNOSIS — R51 Headache: Secondary | ICD-10-CM | POA: Diagnosis not present

## 2017-01-08 DIAGNOSIS — G43719 Chronic migraine without aura, intractable, without status migrainosus: Secondary | ICD-10-CM | POA: Diagnosis not present

## 2017-01-15 DIAGNOSIS — D485 Neoplasm of uncertain behavior of skin: Secondary | ICD-10-CM | POA: Diagnosis not present

## 2017-01-15 DIAGNOSIS — L821 Other seborrheic keratosis: Secondary | ICD-10-CM | POA: Diagnosis not present

## 2017-01-17 DIAGNOSIS — C44311 Basal cell carcinoma of skin of nose: Secondary | ICD-10-CM | POA: Diagnosis not present

## 2017-01-21 ENCOUNTER — Ambulatory Visit: Payer: Medicare Other

## 2017-01-31 ENCOUNTER — Ambulatory Visit (INDEPENDENT_AMBULATORY_CARE_PROVIDER_SITE_OTHER): Payer: Medicare Other | Admitting: Pharmacist

## 2017-01-31 ENCOUNTER — Ambulatory Visit (INDEPENDENT_AMBULATORY_CARE_PROVIDER_SITE_OTHER): Payer: Medicare Other

## 2017-01-31 DIAGNOSIS — Z5181 Encounter for therapeutic drug level monitoring: Secondary | ICD-10-CM

## 2017-01-31 DIAGNOSIS — Z952 Presence of prosthetic heart valve: Secondary | ICD-10-CM

## 2017-01-31 DIAGNOSIS — M810 Age-related osteoporosis without current pathological fracture: Secondary | ICD-10-CM

## 2017-01-31 DIAGNOSIS — I4891 Unspecified atrial fibrillation: Secondary | ICD-10-CM | POA: Diagnosis not present

## 2017-01-31 LAB — POCT INR: INR: 2.4

## 2017-01-31 NOTE — Patient Instructions (Signed)
Description   Continue taking 1 tablet daily except 1.5 tablets on Mondays. Recheck in 5 weeks. Call us with any medication changes or concerns # [phone] [facility] Main # [phone].

## 2017-02-04 DIAGNOSIS — M810 Age-related osteoporosis without current pathological fracture: Secondary | ICD-10-CM | POA: Diagnosis not present

## 2017-02-04 MED ORDER — DENOSUMAB 60 MG/ML ~~LOC~~ SOLN
60.0000 mg | Freq: Once | SUBCUTANEOUS | Status: AC
  Administered 2017-02-04: 60 mg via SUBCUTANEOUS

## 2017-02-04 NOTE — Progress Notes (Signed)
prolia Injection given.   Stacy J Burns, MD  

## 2017-02-22 ENCOUNTER — Other Ambulatory Visit: Payer: Self-pay | Admitting: Internal Medicine

## 2017-02-26 DIAGNOSIS — D485 Neoplasm of uncertain behavior of skin: Secondary | ICD-10-CM | POA: Diagnosis not present

## 2017-02-26 DIAGNOSIS — Z85820 Personal history of malignant melanoma of skin: Secondary | ICD-10-CM | POA: Diagnosis not present

## 2017-02-26 DIAGNOSIS — Z08 Encounter for follow-up examination after completed treatment for malignant neoplasm: Secondary | ICD-10-CM | POA: Diagnosis not present

## 2017-02-26 DIAGNOSIS — Z1283 Encounter for screening for malignant neoplasm of skin: Secondary | ICD-10-CM | POA: Diagnosis not present

## 2017-02-26 DIAGNOSIS — D225 Melanocytic nevi of trunk: Secondary | ICD-10-CM | POA: Diagnosis not present

## 2017-02-26 DIAGNOSIS — D2261 Melanocytic nevi of right upper limb, including shoulder: Secondary | ICD-10-CM | POA: Diagnosis not present

## 2017-03-07 ENCOUNTER — Ambulatory Visit (INDEPENDENT_AMBULATORY_CARE_PROVIDER_SITE_OTHER): Payer: Medicare Other | Admitting: *Deleted

## 2017-03-07 DIAGNOSIS — Z5181 Encounter for therapeutic drug level monitoring: Secondary | ICD-10-CM

## 2017-03-07 DIAGNOSIS — I4891 Unspecified atrial fibrillation: Secondary | ICD-10-CM | POA: Diagnosis not present

## 2017-03-07 DIAGNOSIS — Z952 Presence of prosthetic heart valve: Secondary | ICD-10-CM

## 2017-03-07 LAB — POCT INR: INR: 2.2

## 2017-03-07 NOTE — Patient Instructions (Signed)
Description   Continue taking 1 tablet daily except 1.5 tablets on Mondays. Recheck in 6 weeks. Call us with any medication changes or concerns # [phone] [facility] Main # [phone].

## 2017-03-27 DIAGNOSIS — D225 Melanocytic nevi of trunk: Secondary | ICD-10-CM | POA: Diagnosis not present

## 2017-03-27 DIAGNOSIS — L821 Other seborrheic keratosis: Secondary | ICD-10-CM | POA: Diagnosis not present

## 2017-03-28 ENCOUNTER — Telehealth: Payer: Self-pay | Admitting: Internal Medicine

## 2017-03-28 NOTE — Telephone Encounter (Signed)
Spoke with Robert Richard wife she stated that the patient will give office a call back when he is ready to schedule his appt. SF

## 2017-04-08 ENCOUNTER — Other Ambulatory Visit: Payer: Self-pay | Admitting: Internal Medicine

## 2017-04-18 ENCOUNTER — Ambulatory Visit (INDEPENDENT_AMBULATORY_CARE_PROVIDER_SITE_OTHER): Payer: Medicare Other | Admitting: *Deleted

## 2017-04-18 ENCOUNTER — Other Ambulatory Visit: Payer: Self-pay | Admitting: Internal Medicine

## 2017-04-18 DIAGNOSIS — I4891 Unspecified atrial fibrillation: Secondary | ICD-10-CM | POA: Diagnosis not present

## 2017-04-18 DIAGNOSIS — Z952 Presence of prosthetic heart valve: Secondary | ICD-10-CM | POA: Diagnosis not present

## 2017-04-18 DIAGNOSIS — Z5181 Encounter for therapeutic drug level monitoring: Secondary | ICD-10-CM | POA: Diagnosis not present

## 2017-04-18 LAB — POCT INR: INR: 1.9

## 2017-04-18 NOTE — Patient Instructions (Signed)
Description   Today take 1.5 tablets then continue taking 1 tablet daily except 1.5 tablets on Mondays. Recheck in 5 weeks. Call us with any medication changes or concerns # [phone] [facility] Main # [phone].

## 2017-05-07 ENCOUNTER — Ambulatory Visit: Payer: Medicare Other | Admitting: Internal Medicine

## 2017-05-07 NOTE — Progress Notes (Deleted)
Subjective:    Patient ID: Robert Richard, male    DOB: 07/29/1930, 84 y.o.   MRN: [id number]  HPI The patient is here for follow up.  Prediabetes:  He is compliant with a low sugar/carbohydrate diet.  He is exercising regularly.  CAD, Afib, Hypertension: He is taking his medication daily. He is compliant with a low sodium diet.  He denies chest pain, palpitations, edema, shortness of breath and regular headaches. He is exercising regularly.  He does not monitor his blood pressure at home.    Hyperlipidemia: He is taking his medication daily. He is compliant with a low fat/cholesterol diet. He is exercising regularly. He denies myalgias.   GERD:  He is taking his medication daily as prescribed.  He denies any GERD symptoms and feels his GERD is well controlled.   Anxiety: He is taking his medication daily as prescribed. He denies any side effects from the medication. He feels his anxiety is well controlled and he is happy with his current dose of medication.   OP:  He is getting prolia injections.    Medications and allergies reviewed with patient and updated if appropriate.  Patient Active Problem List   Diagnosis Date Noted  . Anxiety 11/05/2016  . Urinary frequency 09/12/2016  . Cough 09/12/2016  . Prediabetes 05/01/2016  . Pain in both lower extremities 05/01/2016  . Bilateral hearing loss 05/01/2016  . Migraine 04/26/2015  . Aortic valve replaced 04/21/2015  . Subacute confusional state 12/14/2014  . Dyspnea 09/16/2013  . Pulmonary infiltrates 07/31/2013  . Encounter for therapeutic drug monitoring 03/20/2013  . CAD (coronary artery disease) 03/13/2013  . Atrial fibrillation, controlled ([place]) 03/13/2013  . Protein-calorie malnutrition, severe ([place]) 02/27/2013  . S/P CABG x 3 02/20/2013  . History of embolectomy 09/22/2012  . CKD (chronic kidney disease) 09/22/2012  . Syncope 09/15/2012  . Peripheral vascular disease, unspecified ([place]) 07/08/2012  . Mitral stenosis  07/27/2010  . Hyperlipidemia 10/25/2009  . Essential hypertension 10/25/2009  . NEPHROLITHIASIS, HX OF 10/25/2009  . SLEEP DISORDER 11/29/2008  . SNORING 10/14/2008  . APNEA 10/05/2008  . SKIN CANCER, HX OF 10/05/2008  . RESTLESS LEG SYNDROME 09/11/2007  . Allergic rhinitis, cause unspecified 09/11/2007  . GOUT 03/15/2006  . ASTHMA 03/15/2006  . COPD 03/15/2006  . Osteoporosis 03/15/2006  . COLONIC POLYPS, HX OF 03/15/2006    Current Outpatient Medications on File Prior to Visit  Medication Sig Dispense Refill  . busPIRone (BUSPAR) 10 MG tablet Take 2 tablets (20 mg total) by mouth 2 (two) times daily. 120 tablet 0  . busPIRone (BUSPAR) 10 MG tablet TAKE TWO (2) TABLETS BY MOUTH 2 TIMES DAILY 120 tablet 1  . diltiazem (CARDIZEM CD) 120 MG 24 hr capsule TAKE 1 CAPSULE (120 MG TOTAL) BY MOUTH DAILY. TAKE IN ADDITION TO 240MG  DAILY 90 capsule 2  . diltiazem (CARDIZEM CD) 240 MG 24 hr capsule Take 1 capsule (240 mg total) by mouth daily. 90 capsule 2  . gabapentin (NEURONTIN) 100 MG capsule Take 1 tablet at bedtime x 1 week, then increase to 1 tablet twice daily 180 capsule 3  . ketotifen (ZADITOR) 0.025 % ophthalmic solution Place 1 drop into both eyes daily.     . Lidocaine HCl 4 % SOLN Apply 4 mLs topically as needed (for migraines).     . Multiple Vitamins-Minerals (DAILY MULTIVITAMIN) CAPS Take 1 capsule by mouth daily.     . nitroGLYCERIN (NITROSTAT) 0.4 MG SL tablet Place 0.4 mg  under the tongue every 5 (five) minutes as needed for chest pain.    . polyethylene glycol (MIRALAX / GLYCOLAX) packet Take 17 g by mouth daily. 14 each 0  . pravastatin (PRAVACHOL) 40 MG tablet TAKE ONE (1) TABLET EACH DAY 90 tablet 1  . ranitidine (ZANTAC) 150 MG tablet TAKE ONE TABLET BY MOUTH TWICE A DAY 180 tablet 1  . RAPAFLO 8 MG CAPS capsule     . topiramate (TOPAMAX) 25 MG tablet Take 75 mg by mouth daily.    Marland Kitchen warfarin (COUMADIN) 2 MG tablet TAKE ONE TO ONE AND ONE-HALF TABLETS BY MOUTH DAILY OR  AS DIRECTED BYCOUMADIN CLINIC 40 tablet 3   No current facility-administered medications on file prior to visit.     Past Medical History:  Diagnosis Date  . Allergic rhinitis   . Anemia   . Arthritis    SHOULDER  . Asthma with bronchitis   . Colon polyp   . COPD (chronic obstructive pulmonary disease) (HCC)    Astmatic bronchitis  . Dysrhythmia 08/2012   NEW ONSET ATRIAL FIBRILATION  . Gout    PMH  . Headache(784.0)   . HLD (hyperlipidemia)   . Nephrolithiasis   . Osteoporosis   . RLS (restless legs syndrome)   . Skin cancer (melanoma) ([place]) 2012   Right neck  . Skin cancer, basal cell    Dr Nevada Crane, [facility]    Past Surgical History:  Procedure Laterality Date  . AORTIC VALVE REPLACEMENT N/A 02/20/2013   Procedure: AORTIC VALVE REPLACEMENT (AVR);  Surgeon: Ivin Poot, MD;  Location: [place];  Service: Open Heart Surgery;  Laterality: N/A;  . COLONOSCOPY W/ POLYPECTOMY     Dr Deatra Ina  . CORONARY ARTERY BYPASS GRAFT N/A 02/20/2013   Procedure: CORONARY ARTERY BYPASS GRAFTING (CABG);  Surgeon: Ivin Poot, MD;  Location: [place];  Service: Open Heart Surgery;  Laterality: N/A;  . FINGER SURGERY     for foreign body  . INGUINAL HERNIA REPAIR    . INTRAOPERATIVE TRANSESOPHAGEAL ECHOCARDIOGRAM N/A 02/20/2013   Procedure: INTRAOPERATIVE TRANSESOPHAGEAL ECHOCARDIOGRAM;  Surgeon: Ivin Poot, MD;  Location: [place];  Service: Open Heart Surgery;  Laterality: N/A;  . LEFT AND RIGHT HEART CATHETERIZATION WITH CORONARY ANGIOGRAM N/A 01/05/2013   Procedure: LEFT AND RIGHT HEART CATHETERIZATION WITH CORONARY ANGIOGRAM;  Surgeon: Blane Ohara, MD;  Location: [facility] CATH LAB;  Service: Cardiovascular;  Laterality: N/A;  . LEG SURGERY  06/2010    Dr.Lawson, for blood clott. Left leg   . NECK SURGERY  12/2010   Melanoma ; [facility]   . NOSE SURGERY     Septal Deviation  . RIGHT HEART CATHETERIZATION N/A 01/23/2013   Procedure: RIGHT HEART CATH;  Surgeon: Jolaine Artist, MD;   Location[facility] C[facility] LAB;  Service: Cardiovascular;  Laterality: N/A;  . TEE WITHOUT CARDIOVERSION N/A 01/23/2013   Procedure: TRANSESOPHAGEAL ECHOCARDIOGRAM (TEE);  Surgeon: Jolaine Artist, MD;  Location[facility] ENDOSCOPY;  Service: Cardiovascular;  Laterality: N/A;  sign heart cath consent w/tee consent    Social History   Socioeconomic History  . Marital status: Married    Spouse name: Not on file  . Number of children: Not on file  . Years of education: Not on file  . Highest education level: Not on file  Social Needs  . Financial resource strain: Not on file  . Food insecurity - worry: Not on file  . Food insecurity - inability: Not on file  .  Transportation needs - medical: Not on file  . Transportation needs - non-medical: Not on file  Occupational History  . Occupation: retired    Fish farm manager: [employer]  Tobacco Use  . Smoking status: Former Smoker    Packs/day: 3.00    Years: 15.00    Pack years: 45.00    Types: Cigarettes    Last attempt to quit: 02/19/1961    Years since quitting: 56.2  . Smokeless tobacco: Never Used  Substance and Sexual Activity  . Alcohol use: No  . Drug use: No  . Sexual activity: Not on file  Other Topics Concern  . Not on file  Social History Narrative  . Not on file    Family History  Problem Relation Age of Onset  . Allergies Mother   . Coronary artery disease Mother   . Allergy (severe) Mother   . Allergies Father   . Stroke Father   . Heart attack Father 35  . Asthma Father   . Allergy (severe) Father   . Allergies Sister   . Allergy (severe) Sister   . Allergies Brother        x2  . Allergy (severe) Brother   . Diabetes Brother   . Allergy (severe) Brother   . Emphysema Brother        smoked  . Cancer Son     Review of Systems     Objective:  There were no vitals filed for this visit. BP Readings from Last 3 Encounters:  11/19/16 120/70  11/05/16 136/60  09/12/16 120/64   Wt Readings from Last 3  Encounters:  11/19/16 136 lb (61.7 kg)  11/05/16 144 lb (65.3 kg)  09/12/16 141 lb (64 kg)   There is no height or weight on file to calculate BMI.   Physical Exam    Constitutional: Appears well-developed and well-nourished. No distress.  HENT:  Head: Normocephalic and atraumatic.  Neck: Neck supple. No tracheal deviation present. No thyromegaly present.  No cervical lymphadenopathy Cardiovascular: Normal rate, regular rhythm and normal heart sounds.   No murmur heard. No carotid bruit .  No edema Pulmonary/Chest: Effort normal and breath sounds normal. No respiratory distress. No has no wheezes. No rales.  Skin: Skin is warm and dry. Not diaphoretic.  Psychiatric: Normal mood and affect. Behavior is normal.      Assessment & Plan:    See Problem List for Assessment and Plan of chronic medical problems.

## 2017-05-09 ENCOUNTER — Other Ambulatory Visit: Payer: Self-pay | Admitting: Internal Medicine

## 2017-05-23 ENCOUNTER — Ambulatory Visit (INDEPENDENT_AMBULATORY_CARE_PROVIDER_SITE_OTHER): Payer: Medicare Other | Admitting: *Deleted

## 2017-05-23 DIAGNOSIS — Z5181 Encounter for therapeutic drug level monitoring: Secondary | ICD-10-CM

## 2017-05-23 DIAGNOSIS — I4891 Unspecified atrial fibrillation: Secondary | ICD-10-CM

## 2017-05-23 DIAGNOSIS — Z952 Presence of prosthetic heart valve: Secondary | ICD-10-CM

## 2017-05-23 LAB — POCT INR: INR: 2.8

## 2017-05-23 NOTE — Patient Instructions (Signed)
Description   Continue taking 1 tablet daily except 1.5 tablets on Mondays. Recheck in 6 weeks. Call us with any medication changes or concerns # [phone] [facility], Main # [phone].

## 2017-05-26 NOTE — Progress Notes (Signed)
Subjective:    Patient ID: Robert Richard, male    DOB: Apr 17, 1930, 84 y.o.   MRN: [id number]  HPI The patient is here for follow up.  Prediabetes:  He is compliant with a low sugar/carbohydrate diet.  He is exercising regularly.  CAD, Afib, Hypertension: He is taking his medication daily. He is compliant with a low sodium diet.  He denies chest pain, palpitations, edema, shortness of breath and regular headaches. He is exercising regularly.  He does not monitor his blood pressure at home.    Hyperlipidemia: He is taking his medication daily. He is compliant with a low fat/cholesterol diet. He is exercising regularly. He denies myalgias.   GERD:  He is taking his medication daily as prescribed.  He denies any GERD symptoms and feels his GERD is well controlled.   Anxiety: He is taking his medication daily as prescribed. He denies any side effects from the medication. He feels his anxiety is well controlled and he is happy with his current dose of medication.      OP:  He is getting prolia injections.  Doing exercise bike 30 minutes /day.  Medications and allergies reviewed with patient and updated if appropriate.  Patient Active Problem List   Diagnosis Date Noted  . Anxiety 11/05/2016  . Urinary frequency 09/12/2016  . Cough 09/12/2016  . Prediabetes 05/01/2016  . Pain in both lower extremities 05/01/2016  . Bilateral hearing loss 05/01/2016  . Migraine 04/26/2015  . Aortic valve replaced 04/21/2015  . Subacute confusional state 12/14/2014  . Dyspnea 09/16/2013  . Pulmonary infiltrates 07/31/2013  . Encounter for therapeutic drug monitoring 03/20/2013  . CAD (coronary artery disease) 03/13/2013  . Atrial fibrillation, controlled ([place]) 03/13/2013  . Protein-calorie malnutrition, severe ([place]) 02/27/2013  . S/P CABG x 3 02/20/2013  . History of embolectomy 09/22/2012  . CKD (chronic kidney disease) 09/22/2012  . Syncope 09/15/2012  . Peripheral vascular disease,  unspecified ([place]) 07/08/2012  . Mitral stenosis 07/27/2010  . Hyperlipidemia 10/25/2009  . Essential hypertension 10/25/2009  . NEPHROLITHIASIS, HX OF 10/25/2009  . SLEEP DISORDER 11/29/2008  . SNORING 10/14/2008  . APNEA 10/05/2008  . SKIN CANCER, HX OF 10/05/2008  . RESTLESS LEG SYNDROME 09/11/2007  . Allergic rhinitis, cause unspecified 09/11/2007  . GOUT 03/15/2006  . ASTHMA 03/15/2006  . COPD 03/15/2006  . Osteoporosis 03/15/2006  . COLONIC POLYPS, HX OF 03/15/2006    Current Outpatient Medications on File Prior to Visit  Medication Sig Dispense Refill  . busPIRone (BUSPAR) 10 MG tablet TAKE TWO (2) TABLETS BY MOUTH 2 TIMES DAILY 120 tablet 1  . diltiazem (CARDIZEM CD) 120 MG 24 hr capsule TAKE 1 CAPSULE (120 MG TOTAL) BY MOUTH DAILY. TAKE IN ADDITION TO 240MG  DAILY 90 capsule 2  . diltiazem (CARDIZEM CD) 240 MG 24 hr capsule Take 1 capsule (240 mg total) by mouth daily. 90 capsule 2  . gabapentin (NEURONTIN) 100 MG capsule Take 1 tablet at bedtime x 1 week, then increase to 1 tablet twice daily 180 capsule 3  . ketotifen (ZADITOR) 0.025 % ophthalmic solution Place 1 drop into both eyes daily.     . Lidocaine HCl 4 % SOLN Apply 4 mLs topically as needed (for migraines).     . Multiple Vitamins-Minerals (DAILY MULTIVITAMIN) CAPS Take 1 capsule by mouth daily.     . nitroGLYCERIN (NITROSTAT) 0.4 MG SL tablet Place 0.4 mg under the tongue every 5 (five) minutes as needed for chest pain.    Marland Kitchen  polyethylene glycol (MIRALAX / GLYCOLAX) packet Take 17 g by mouth daily. 14 each 0  . pravastatin (PRAVACHOL) 40 MG tablet TAKE ONE (1) TABLET EACH DAY 90 tablet 1  . ranitidine (ZANTAC) 150 MG tablet TAKE ONE TABLET BY MOUTH TWICE A DAY 180 tablet 0  . RAPAFLO 8 MG CAPS capsule     . topiramate (TOPAMAX) 25 MG tablet Take 75 mg by mouth daily.    Marland Kitchen warfarin (COUMADIN) 2 MG tablet TAKE ONE TO ONE AND ONE-HALF TABLETS BY MOUTH DAILY OR AS DIRECTED BYCOUMADIN CLINIC 40 tablet 3   No current  facility-administered medications on file prior to visit.     Past Medical History:  Diagnosis Date  . Allergic rhinitis   . Anemia   . Arthritis    SHOULDER  . Asthma with bronchitis   . Colon polyp   . COPD (chronic obstructive pulmonary disease) (HCC)    Astmatic bronchitis  . Dysrhythmia 08/2012   NEW ONSET ATRIAL FIBRILATION  . Gout    PMH  . Headache(784.0)   . HLD (hyperlipidemia)   . Nephrolithiasis   . Osteoporosis   . RLS (restless legs syndrome)   . Skin cancer (melanoma) ([place]) 2012   Right neck  . Skin cancer, basal cell    Dr Nevada Crane, [facility]    Past Surgical History:  Procedure Laterality Date  . AORTIC VALVE REPLACEMENT N/A 02/20/2013   Procedure: AORTIC VALVE REPLACEMENT (AVR);  Surgeon: Ivin Poot, MD;  Location: [place];  Service: Open Heart Surgery;  Laterality: N/A;  . COLONOSCOPY W/ POLYPECTOMY     Dr Deatra Ina  . CORONARY ARTERY BYPASS GRAFT N/A 02/20/2013   Procedure: CORONARY ARTERY BYPASS GRAFTING (CABG);  Surgeon: Ivin Poot, MD;  Location: [place];  Service: Open Heart Surgery;  Laterality: N/A;  . FINGER SURGERY     for foreign body  . INGUINAL HERNIA REPAIR    . INTRAOPERATIVE TRANSESOPHAGEAL ECHOCARDIOGRAM N/A 02/20/2013   Procedure: INTRAOPERATIVE TRANSESOPHAGEAL ECHOCARDIOGRAM;  Surgeon: Ivin Poot, MD;  Location: [place];  Service: Open Heart Surgery;  Laterality: N/A;  . LEFT AND RIGHT HEART CATHETERIZATION WITH CORONARY ANGIOGRAM N/A 01/05/2013   Procedure: LEFT AND RIGHT HEART CATHETERIZATION WITH CORONARY ANGIOGRAM;  Surgeon: Blane Ohara, MD;  Location: [place] CATH LAB;  Service: Cardiovascular;  Laterality: N/A;  . LEG SURGERY  06/2010    Dr.Lawson, for blood clott. Left leg   . NECK SURGERY  12/2010   Melanoma ; [facility]   . NOSE SURGERY     Septal Deviation  . RIGHT HEART CATHETERIZATION N/A 01/23/2013   Procedure: RIGHT HEART CATH;  Surgeon: Jolaine Artist, MD;  Location: [facility] CATH LAB;  Service: Cardiovascular;  Laterality:  N/A;  . TEE WITHOUT CARDIOVERSION N/A 01/23/2013   Procedure: TRANSESOPHAGEAL ECHOCARDIOGRAM (TEE);  Surgeon: Jolaine Artist, MD;  Location: [facility] ENDOSCOPY;  Service: Cardiovascular;  Laterality: N/A;  sign heart cath consent w/tee consent    Social History   Socioeconomic History  . Marital status: Married    Spouse name: Not on file  . Number of children: Not on file  . Years of education: Not on file  . Highest education level: Not on file  Occupational History  . Occupation: retired    Fish farm manager: [employer]  . Financial resource strain: Not on file  . Food insecurity:    Worry: Not on file    Inability: Not on file  . Transportation needs:  Medical: Not on file    Non-medical: Not on file  Tobacco Use  . Smoking status: Former Smoker    Packs/day: 3.00    Years: 15.00    Pack years: 45.00    Types: Cigarettes    Last attempt to quit: 02/19/1961    Years since quitting: 56.3  . Smokeless tobacco: Never Used  Substance and Sexual Activity  . Alcohol use: No  . Drug use: No  . Sexual activity: Not on file  Lifestyle  . Physical activity:    Days per week: Not on file    Minutes per session: Not on file  . Stress: Not on file  Relationships  . Social connections:    Talks on phone: Not on file    Gets together: Not on file    Attends religious service: Not on file    Active member of club or organization: Not on file    Attends meetings of clubs or organizations: Not on file    Relationship status: Not on file  Other Topics Concern  . Not on file  Social History Narrative  . Not on file    Family History  Problem Relation Age of Onset  . Allergies Mother   . Coronary artery disease Mother   . Allergy (severe) Mother   . Allergies Father   . Stroke Father   . Heart attack Father 30  . Asthma Father   . Allergy (severe) Father   . Allergies Sister   . Allergy (severe) Sister   . Allergies Brother        x2  . Allergy (severe)  Brother   . Diabetes Brother   . Allergy (severe) Brother   . Emphysema Brother        smoked  . Cancer Son     Review of Systems  Constitutional: Negative for chills and fever.  Respiratory: Positive for cough (at times). Negative for shortness of breath and wheezing.   Cardiovascular: Negative for chest pain, palpitations and leg swelling.  Gastrointestinal: Negative for abdominal pain.  Neurological: Negative for light-headedness and headaches.       Objective:   Vitals:   05/28/17 1508  BP: 138/86  Pulse: 61  Resp: 16  Temp: 98.3 F (36.8 C)  SpO2: 98%   BP Readings from Last 3 Encounters:  05/28/17 138/86  11/19/16 120/70  11/05/16 136/60   Wt Readings from Last 3 Encounters:  05/28/17 143 lb (64.9 kg)  11/19/16 136 lb (61.7 kg)  11/05/16 144 lb (65.3 kg)   Body mass index is 18.87 kg/m.   Physical Exam    Constitutional: Appears well-developed and well-nourished. No distress.  HENT:  Head: Normocephalic and atraumatic.  Neck: Neck supple. No tracheal deviation present. No thyromegaly present.  No cervical lymphadenopathy Cardiovascular: Normal rate, regular rhythm and normal heart sounds.   No murmur heard. No carotid bruit .  No edema Pulmonary/Chest: Effort normal and breath sounds normal. No respiratory distress. No has no wheezes. No rales.  Skin: Skin is warm and dry. Not diaphoretic.  Psychiatric: Normal mood and affect. Behavior is normal.      Assessment & Plan:    See Problem List for Assessment and Plan of chronic medical problems.

## 2017-05-27 ENCOUNTER — Ambulatory Visit: Payer: Medicare Other | Admitting: Internal Medicine

## 2017-05-28 ENCOUNTER — Encounter: Payer: Self-pay | Admitting: Internal Medicine

## 2017-05-28 ENCOUNTER — Ambulatory Visit: Payer: Medicare Other | Admitting: Internal Medicine

## 2017-05-28 ENCOUNTER — Ambulatory Visit: Payer: Medicare Other

## 2017-05-28 ENCOUNTER — Other Ambulatory Visit (INDEPENDENT_AMBULATORY_CARE_PROVIDER_SITE_OTHER): Payer: Medicare Other

## 2017-05-28 VITALS — BP 138/86 | HR 61 | Temp 98.3°F | Resp 16 | Wt 143.0 lb

## 2017-05-28 DIAGNOSIS — I10 Essential (primary) hypertension: Secondary | ICD-10-CM

## 2017-05-28 DIAGNOSIS — I2510 Atherosclerotic heart disease of native coronary artery without angina pectoris: Secondary | ICD-10-CM | POA: Diagnosis not present

## 2017-05-28 DIAGNOSIS — R7303 Prediabetes: Secondary | ICD-10-CM | POA: Diagnosis not present

## 2017-05-28 DIAGNOSIS — F419 Anxiety disorder, unspecified: Secondary | ICD-10-CM

## 2017-05-28 DIAGNOSIS — I4891 Unspecified atrial fibrillation: Secondary | ICD-10-CM

## 2017-05-28 DIAGNOSIS — M810 Age-related osteoporosis without current pathological fracture: Secondary | ICD-10-CM | POA: Diagnosis not present

## 2017-05-28 DIAGNOSIS — E7849 Other hyperlipidemia: Secondary | ICD-10-CM | POA: Diagnosis not present

## 2017-05-28 LAB — COMPREHENSIVE METABOLIC PANEL
ALT: 10 U/L (ref 0–53)
AST: 16 U/L (ref 0–37)
Albumin: 4.1 g/dL (ref 3.5–5.2)
Alkaline Phosphatase: 87 U/L (ref 39–117)
BUN: 21 mg/dL (ref 6–23)
CO2: 23 mEq/L (ref 19–32)
Calcium: 9.3 mg/dL (ref 8.4–10.5)
Chloride: 109 mEq/L (ref 96–112)
Creatinine, Ser: 1.49 mg/dL (ref 0.40–1.50)
GFR: 47.64 mL/min — ABNORMAL LOW (ref >60.00)
Glucose, Bld: 96 mg/dL (ref 70–99)
Potassium: 4.4 mEq/L (ref 3.5–5.1)
Sodium: 139 mEq/L (ref 135–145)
Total Bilirubin: 0.4 mg/dL (ref 0.2–1.2)
Total Protein: 8.2 g/dL (ref 6.0–8.3)

## 2017-05-28 LAB — CBC WITH DIFFERENTIAL/PLATELET
Basophils Absolute: 0.1 10*3/uL (ref 0.0–0.1)
Basophils Relative: 0.5 % (ref 0.0–3.0)
Eosinophils Absolute: 0.2 10*3/uL (ref 0.0–0.7)
Eosinophils Relative: 2 % (ref 0.0–5.0)
HCT: 34.8 % — ABNORMAL LOW (ref 39.0–52.0)
Hemoglobin: 11.7 g/dL — ABNORMAL LOW (ref 13.0–17.0)
Lymphocytes Relative: 21.8 % (ref 12.0–46.0)
Lymphs Abs: 2.1 10*3/uL (ref 0.7–4.0)
MCHC: 33.6 g/dL (ref 30.0–36.0)
MCV: 87.6 fl (ref 78.0–100.0)
Monocytes Absolute: 1.3 10*3/uL — ABNORMAL HIGH (ref 0.1–1.0)
Monocytes Relative: 13.5 % — ABNORMAL HIGH (ref 3.0–12.0)
Neutro Abs: 6 10*3/uL (ref 1.4–7.7)
Neutrophils Relative %: 62.2 % (ref 43.0–77.0)
Platelets: 183 10*3/uL (ref 150.0–400.0)
RBC: 3.97 Mil/uL — ABNORMAL LOW (ref 4.22–5.81)
RDW: 14.3 % (ref 11.5–15.5)
WBC: 9.7 10*3/uL (ref 4.0–10.5)

## 2017-05-28 LAB — LIPID PANEL
Cholesterol: 114 mg/dL (ref 0–200)
HDL: 35 mg/dL — ABNORMAL LOW (ref >39.00)
LDL Cholesterol: 61 mg/dL (ref 0–99)
NonHDL: 79.09
Total CHOL/HDL Ratio: 3
Triglycerides: 91 mg/dL (ref 0.0–149.0)
VLDL: 18.2 mg/dL (ref 0.0–40.0)

## 2017-05-28 LAB — HEMOGLOBIN A1C: Hgb A1c MFr Bld: 6.1 % (ref 4.6–6.5)

## 2017-05-28 NOTE — Patient Instructions (Addendum)
  Test(s) ordered today. Your results will be released to MyChart (or called to you) after review, usually within 72hours after test completion. If any changes need to be made, you will be notified at that same time.  Medications reviewed and updated.  No changes recommended at this time.    Please followup in 6 months   

## 2017-05-28 NOTE — Progress Notes (Unsigned)
Subjective:   Robert Richard is a 84 y.o. male who presents for Medicare Annual/Subsequent preventive examination.  Review of Systems:  No ROS.  Medicare Wellness Visit. Additional risk factors are reflected in the social history.    Sleep patterns: {SX; SLEEP PATTERNS:18802::"feels rested on waking","does not get up to void","gets up *** times nightly to void","sleeps *** hours nightly"}.    Home Safety/Smoke Alarms: Feels safe in home. Smoke alarms in place.  Living environment; residence and Firearm Safety: {Rehab home environment / accessibility:30080::"no firearms","firearms stored safely"}. Seat Belt Safety/Bike Helmet: Wears seat belt.     Objective:    Vitals: There were no vitals taken for this visit.  There is no height or weight on file to calculate BMI.  Advanced Directives 08/24/2016 09/20/2015 11/10/2013 03/17/2013 02/20/2013 02/10/2013 01/05/2013  Does Patient Have a Medical Advance Directive? No Yes No Patient does not have advance directive;Patient would not like information Patient does not have advance directive;Patient would not like information - Patient does not have advance directive  Copy of [place] in Chart? - Yes - - - - -  Would patient like information on creating a medical advance directive? No - Patient declined - Yes - Scientist, clinical (histocompatibility and immunogenetics) given - - - -  Pre-existing out of facility DNR order (yellow form or pink MOST form) - - - - - No -    Tobacco Social History   Tobacco Use  Smoking Status Former Smoker  . Packs/day: 3.00  . Years: 15.00  . Pack years: 45.00  . Types: Cigarettes  . Last attempt to quit: 02/19/1961  . Years since quitting: 56.3  Smokeless Tobacco Never Used     Counseling given: Not Answered  Past Medical History:  Diagnosis Date  . Allergic rhinitis   . Anemia   . Arthritis    SHOULDER  . Asthma with bronchitis   . Colon polyp   . COPD (chronic obstructive pulmonary disease) (HCC)    Astmatic  bronchitis  . Dysrhythmia 08/2012   NEW ONSET ATRIAL FIBRILATION  . Gout    PMH  . Headache(784.0)   . HLD (hyperlipidemia)   . Nephrolithiasis   . Osteoporosis   . RLS (restless legs syndrome)   . Skin cancer (melanoma) ([place]) 2012   Right neck  . Skin cancer, basal cell    Dr Nevada Crane, [facility]   Past Surgical History:  Procedure Laterality Date  . AORTIC VALVE REPLACEMENT N/A 02/20/2013   Procedure: AORTIC VALVE REPLACEMENT (AVR);  Surgeon: Ivin Poot, MD;  Location: [place];  Service: Open Heart Surgery;  Laterality: N/A;  . COLONOSCOPY W/ POLYPECTOMY     Dr Deatra Ina  . CORONARY ARTERY BYPASS GRAFT N/A 02/20/2013   Procedure: CORONARY ARTERY BYPASS GRAFTING (CABG);  Surgeon: Ivin Poot, MD;  Location: [place];  Service: Open Heart Surgery;  Laterality: N/A;  . FINGER SURGERY     for foreign body  . INGUINAL HERNIA REPAIR    . INTRAOPERATIVE TRANSESOPHAGEAL ECHOCARDIOGRAM N/A 02/20/2013   Procedure: INTRAOPERATIVE TRANSESOPHAGEAL ECHOCARDIOGRAM;  Surgeon: Ivin Poot, MD;  Location: [place];  Service: Open Heart Surgery;  Laterality: N/A;  . LEFT AND RIGHT HEART CATHETERIZATION WITH CORONARY ANGIOGRAM N/A 01/05/2013   Procedure: LEFT AND RIGHT HEART CATHETERIZATION WITH CORONARY ANGIOGRAM;  Surgeon: Blane Ohara, MD;  Location: [facility] CATH LAB;  Service: Cardiovascular;  Laterality: N/A;  . LEG SURGERY  06/2010    Dr.Lawson, for blood clott. Left leg   .  NECK SURGERY  12/2010   Melanoma ; [facility]   . NOSE SURGERY     Septal Deviation  . RIGHT HEART CATHETERIZATION N/A 01/23/2013   Procedure: RIGHT HEART CATH;  Surgeon: Jolaine Artist, MD;  Location: [facility] CATH LAB;  Service: Cardiovascular;  Laterality: N/A;  . TEE WITHOUT CARDIOVERSION N/A 01/23/2013   Procedure: TRANSESOPHAGEAL ECHOCARDIOGRAM (TEE);  Surgeon: Jolaine Artist, MD;  Location: [facility] ENDOSCOPY;  Service: Cardiovascular;  Laterality: N/A;  sign heart cath consent w/tee consent   Family History  Problem Relation  Age of Onset  . Allergies Mother   . Coronary artery disease Mother   . Allergy (severe) Mother   . Allergies Father   . Stroke Father   . Heart attack Father 66  . Asthma Father   . Allergy (severe) Father   . Allergies Sister   . Allergy (severe) Sister   . Allergies Brother        x2  . Allergy (severe) Brother   . Diabetes Brother   . Allergy (severe) Brother   . Emphysema Brother        smoked  . Cancer Son    Social History   Socioeconomic History  . Marital status: Married    Spouse name: Not on file  . Number of children: Not on file  . Years of education: Not on file  . Highest education level: Not on file  Occupational History  . Occupation: retired    Fish farm manager: [employer]  . Financial resource strain: Not on file  . Food insecurity:    Worry: Not on file    Inability: Not on file  . Transportation needs:    Medical: Not on file    Non-medical: Not on file  Tobacco Use  . Smoking status: Former Smoker    Packs/day: 3.00    Years: 15.00    Pack years: 45.00    Types: Cigarettes    Last attempt to quit: 02/19/1961    Years since quitting: 56.3  . Smokeless tobacco: Never Used  Substance and Sexual Activity  . Alcohol use: No  . Drug use: No  . Sexual activity: Not on file  Lifestyle  . Physical activity:    Days per week: Not on file    Minutes per session: Not on file  . Stress: Not on file  Relationships  . Social connections:    Talks on phone: Not on file    Gets together: Not on file    Attends religious service: Not on file    Active member of club or organization: Not on file    Attends meetings of clubs or organizations: Not on file    Relationship status: Not on file  Other Topics Concern  . Not on file  Social History Narrative  . Not on file    Outpatient Encounter Medications as of 05/28/2017  Medication Sig  . busPIRone (BUSPAR) 10 MG tablet TAKE TWO (2) TABLETS BY MOUTH 2 TIMES DAILY  . diltiazem  (CARDIZEM CD) 120 MG 24 hr capsule TAKE 1 CAPSULE (120 MG TOTAL) BY MOUTH DAILY. TAKE IN ADDITION TO 240MG  DAILY  . diltiazem (CARDIZEM CD) 240 MG 24 hr capsule Take 1 capsule (240 mg total) by mouth daily.  Marland Kitchen gabapentin (NEURONTIN) 100 MG capsule Take 1 tablet at bedtime x 1 week, then increase to 1 tablet twice daily  . ketotifen (ZADITOR) 0.025 % ophthalmic solution Place 1 drop into both eyes daily.   Marland Kitchen  Lidocaine HCl 4 % SOLN Apply 4 mLs topically as needed (for migraines).   . Multiple Vitamins-Minerals (DAILY MULTIVITAMIN) CAPS Take 1 capsule by mouth daily.   . nitroGLYCERIN (NITROSTAT) 0.4 MG SL tablet Place 0.4 mg under the tongue every 5 (five) minutes as needed for chest pain.  . polyethylene glycol (MIRALAX / GLYCOLAX) packet Take 17 g by mouth daily.  . pravastatin (PRAVACHOL) 40 MG tablet TAKE ONE (1) TABLET EACH DAY  . ranitidine (ZANTAC) 150 MG tablet TAKE ONE TABLET BY MOUTH TWICE A DAY  . RAPAFLO 8 MG CAPS capsule   . topiramate (TOPAMAX) 25 MG tablet Take 75 mg by mouth daily.  Marland Kitchen warfarin (COUMADIN) 2 MG tablet TAKE ONE TO ONE AND ONE-HALF TABLETS BY MOUTH DAILY OR AS DIRECTED BYCOUMADIN CLINIC   No facility-administered encounter medications on file as of 05/28/2017.     Activities of Daily Living No flowsheet data found.  Patient Care Team: [name], MD as PCP - General (Internal Medicine)   Assessment:   This is a routine wellness examination for Javarious. Physical assessment deferred to PCP.   Exercise Activities and Dietary recommendations   Diet (meal preparation, eat out, water intake, caffeinated beverages, dairy products, fruits and vegetables): {Desc; diets:16563}   Goals    None      Fall Risk Fall Risk  11/19/2016 11/05/2016 07/06/2016 05/01/2016 11/10/2013  Falls in the past year? No No No No Yes  Comment - - - - [place]  Number falls in past yr: - - - - 1  Injury with Fall? - - - - No  Risk for fall due to : - -  - - Impaired vision   Depression Screen PHQ 2/9 Scores 11/05/2016 05/01/2016 11/10/2013 03/23/2013  PHQ - 2 Score 0 0 0 0    Cognitive Function MMSE - Mini Mental State Exam 09/20/2015  Not completed: (No Data)        Immunization History  Administered Date(s) Administered  . Influenza Split 11/28/2010, 11/13/2011  . Influenza Whole 12/03/2006, 11/12/2007, 11/25/2008, 12/09/2009  . Influenza, High Dose Seasonal PF 11/20/2012, 11/22/2015, 12/12/2016  . Influenza,inj,Quad PF,6+ Mos 11/10/2013, 11/19/2014  . Pneumococcal Conjugate-13 09/16/2013  . Pneumococcal Polysaccharide-23 05/20/2006    Screening Tests Health Maintenance  Topic Date Due  . TETANUS/TDAP  05/26/1949  . INFLUENZA VACCINE  09/19/2017  . PNA vac Low Risk Adult  Completed        Plan:     I have personally reviewed and noted the following in the patient's chart:   . Medical and social history . Use of alcohol, tobacco or illicit drugs  . Current medications and supplements . Functional ability and status . Nutritional status . Physical activity . Advanced directives . List of other physicians . Vitals . Screenings to include cognitive, depression, and falls . Referrals and appointments  In addition, I have reviewed and discussed with patient certain preventive protocols, quality metrics, and best practice recommendations. A written personalized care plan for preventive services as well as general preventive health recommendations were provided to patient.     Michiel Cowboy, RN  05/28/2017

## 2017-05-28 NOTE — Assessment & Plan Note (Addendum)
Rate controlled  Asymptomatic Continue current medication On warfarin Cmp, cbc

## 2017-05-28 NOTE — Assessment & Plan Note (Signed)
Check a1c Low sugar / carb diet Stressed regular exercise   

## 2017-05-28 NOTE — Assessment & Plan Note (Signed)
Controlled, stable Continue current dose of medication  

## 2017-05-28 NOTE — Assessment & Plan Note (Signed)
prolia Q 6 months Next due June

## 2017-05-28 NOTE — Assessment & Plan Note (Signed)
Check lipid panel  Continue daily statin Regular exercise and healthy diet encouraged  

## 2017-05-28 NOTE — Assessment & Plan Note (Signed)
No chest pain, sob Exercising daily Continue current medications

## 2017-05-28 NOTE — Assessment & Plan Note (Signed)
BP well controlled Current regimen effective and well tolerated Continue current medications at current doses cmp  

## 2017-05-30 ENCOUNTER — Telehealth: Payer: Self-pay | Admitting: Internal Medicine

## 2017-05-30 NOTE — Telephone Encounter (Signed)
Copied from [place] [id number]. Topic: Quick Communication - Lab Results >> May 30, 2017  1:45 PM Lorrin Jackson, CMA wrote: Called patient to inform them of 05/28/17 lab results. When patient returns call, triage nurse may disclose results. >> May 30, 2017  4:23 PM Marin Olp L wrote: Wife Arbie Cookey calling back for lab results but no PEC nurse available.

## 2017-05-30 NOTE — Telephone Encounter (Signed)
Patient called, left VM to return call. Chart in result notes.

## 2017-05-30 NOTE — Telephone Encounter (Signed)
reroute

## 2017-05-31 NOTE — Telephone Encounter (Signed)
Pt aware of results 

## 2017-06-06 ENCOUNTER — Ambulatory Visit: Payer: Medicare Other | Admitting: Internal Medicine

## 2017-06-10 ENCOUNTER — Other Ambulatory Visit: Payer: Self-pay | Admitting: Internal Medicine

## 2017-06-10 ENCOUNTER — Telehealth: Payer: Self-pay | Admitting: Internal Medicine

## 2017-06-10 NOTE — Telephone Encounter (Signed)
Copied from [place] [id number]. Topic: Quick Communication - Rx Refill/Question >> Jun 10, 2017  1:44 PM Scherrie Gerlach wrote: Medication: busPIRone (BUSPAR) 10 MG tablet  (2 tabs BID) Pharmacy calling to advise this med is on backorder.   Would like to change to a 15 mg (THEY ARE SCORED INTO 3RDS AND PT CAN BREAK) [place], [place] [phone] (Phone) [phone] (Fax)

## 2017-06-11 MED ORDER — BUSPIRONE HCL 10 MG PO TABS: ORAL_TABLET | ORAL | 1 refills | Status: DC

## 2017-06-11 NOTE — Telephone Encounter (Signed)
Spoke with pts wife in inform pharmacy states Buspar is on back order. RX sent to another pharmacy with Wife's approval.

## 2017-07-02 DIAGNOSIS — G43719 Chronic migraine without aura, intractable, without status migrainosus: Secondary | ICD-10-CM | POA: Diagnosis not present

## 2017-07-02 DIAGNOSIS — R51 Headache: Secondary | ICD-10-CM | POA: Diagnosis not present

## 2017-07-04 ENCOUNTER — Ambulatory Visit (INDEPENDENT_AMBULATORY_CARE_PROVIDER_SITE_OTHER): Payer: Medicare Other | Admitting: *Deleted

## 2017-07-04 DIAGNOSIS — Z952 Presence of prosthetic heart valve: Secondary | ICD-10-CM

## 2017-07-04 DIAGNOSIS — I4891 Unspecified atrial fibrillation: Secondary | ICD-10-CM | POA: Diagnosis not present

## 2017-07-04 DIAGNOSIS — Z5181 Encounter for therapeutic drug level monitoring: Secondary | ICD-10-CM

## 2017-07-04 LAB — POCT INR: INR: 2.7

## 2017-07-04 NOTE — Patient Instructions (Signed)
Description   Continue taking 1 tablet daily except 1.5 tablets on Mondays. Recheck in 6 weeks. Call us with any medication changes or concerns # [phone] [facility], Main # [phone].

## 2017-07-18 ENCOUNTER — Telehealth: Payer: Self-pay | Admitting: Internal Medicine

## 2017-07-18 NOTE — Telephone Encounter (Signed)
Left message for patient regarding her Prolia injection.  Insurance was verified and summary of benefits states that patient would be responsible for a $250 copay. Patient is due on or after June 14th. Left message for patient to call back to schedule.

## 2017-07-24 ENCOUNTER — Other Ambulatory Visit: Payer: Self-pay | Admitting: Internal Medicine

## 2017-07-24 MED ORDER — DILTIAZEM HCL ER COATED BEADS 120 MG PO CP24: 120.0000 mg | ORAL_CAPSULE | Freq: Every day | ORAL | 0 refills | Status: DC

## 2017-08-01 ENCOUNTER — Other Ambulatory Visit: Payer: Self-pay | Admitting: Internal Medicine

## 2017-08-10 ENCOUNTER — Other Ambulatory Visit: Payer: Self-pay | Admitting: Internal Medicine

## 2017-08-16 ENCOUNTER — Ambulatory Visit: Payer: Medicare Other

## 2017-08-16 ENCOUNTER — Encounter: Payer: Self-pay | Admitting: Internal Medicine

## 2017-08-16 ENCOUNTER — Ambulatory Visit: Payer: Medicare Other | Admitting: Internal Medicine

## 2017-08-16 ENCOUNTER — Ambulatory Visit (INDEPENDENT_AMBULATORY_CARE_PROVIDER_SITE_OTHER): Payer: Medicare Other | Admitting: *Deleted

## 2017-08-16 VITALS — BP 132/70 | HR 67 | Ht 73.0 in | Wt 140.0 lb

## 2017-08-16 DIAGNOSIS — Z5181 Encounter for therapeutic drug level monitoring: Secondary | ICD-10-CM

## 2017-08-16 DIAGNOSIS — I10 Essential (primary) hypertension: Secondary | ICD-10-CM

## 2017-08-16 DIAGNOSIS — I4891 Unspecified atrial fibrillation: Secondary | ICD-10-CM

## 2017-08-16 DIAGNOSIS — R5383 Other fatigue: Secondary | ICD-10-CM

## 2017-08-16 DIAGNOSIS — Z952 Presence of prosthetic heart valve: Secondary | ICD-10-CM

## 2017-08-16 DIAGNOSIS — I739 Peripheral vascular disease, unspecified: Secondary | ICD-10-CM | POA: Diagnosis not present

## 2017-08-16 DIAGNOSIS — I4892 Unspecified atrial flutter: Secondary | ICD-10-CM | POA: Diagnosis not present

## 2017-08-16 DIAGNOSIS — M810 Age-related osteoporosis without current pathological fracture: Secondary | ICD-10-CM | POA: Diagnosis not present

## 2017-08-16 DIAGNOSIS — I2510 Atherosclerotic heart disease of native coronary artery without angina pectoris: Secondary | ICD-10-CM | POA: Diagnosis not present

## 2017-08-16 LAB — POCT INR: INR: 4.8 — AB (ref 2.0–3.0)

## 2017-08-16 MED ORDER — DENOSUMAB 60 MG/ML ~~LOC~~ SOSY
60.0000 mg | PREFILLED_SYRINGE | Freq: Once | SUBCUTANEOUS | Status: AC
  Administered 2017-08-16: 60 mg via SUBCUTANEOUS

## 2017-08-16 NOTE — Progress Notes (Signed)
Cardiology Office Note   Date:  08/16/2017   ID:  Robert Richard, DOB 1931-02-19, MRN [id number]  PCP:  [name], MD  Cardiologist:   Dorris Carnes, MD   F/U of CAD       History of Present Illness: Robert Richard is a 84 y.o. male with a history ofCAD, s/p CABG x 3 (LIMA to LAD; SVG to OM1; SVG to PDA) and AVR (#23 mm [facility] Ease pericardial valve. Post op course long/complicated. 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  I saw the pt one year ago  (June 2018)  Since seen the pt denies CP  Breathing is OK Pt appears frail Wife says he doesn't eat much  Pt says he really slows down after AM meds    Current Meds  Medication Sig  . busPIRone (BUSPAR) 10 MG tablet TAKE TWO (2) TABLETS BY MOUTH 2 TIMES DAILY  . CARTIA XT 240 MG 24 hr capsule TAKE ONE CAPSULE BY MOUTH DAILY  . diltiazem (CARDIZEM CD) 120 MG 24 hr capsule Take 1 capsule (120 mg total) by mouth daily. In addition to the 240 mg capsule daily.  Marland Kitchen gabapentin (NEURONTIN) 100 MG capsule Take 1 tablet at bedtime x 1 week, then increase to 1 tablet twice daily  . ketotifen (ZADITOR) 0.025 % ophthalmic solution Place 1 drop into both eyes daily.   . Lidocaine HCl 4 % SOLN Apply 4 mLs topically as needed (for migraines).   . Multiple Vitamins-Minerals (DAILY MULTIVITAMIN) CAPS Take 1 capsule by mouth daily.   . nitroGLYCERIN (NITROSTAT) 0.4 MG SL tablet Place 0.4 mg under the tongue every 5 (five) minutes as needed for chest pain.  . polyethylene glycol (MIRALAX / GLYCOLAX) packet Take 17 g by mouth daily.  . pravastatin (PRAVACHOL) 40 MG tablet TAKE ONE TABLET EACH DAY  . ranitidine (ZANTAC) 150 MG tablet TAKE ONE TABLET BY MOUTH TWICE A DAY  . RAPAFLO 8 MG CAPS capsule   . topiramate (TOPAMAX) 25 MG tablet Take 75 mg by mouth daily.  Marland Kitchen warfarin (COUMADIN) 2 MG  tablet TAKE ONE TO ONE AND ONE-HALF TABLETS BY MOUTH DAILY OR AS DIRECTED BYCOUMADIN CLINIC     Allergies:   Iodinated diagnostic agents and Ioxaglate   Past Medical History:  Diagnosis Date  . Allergic rhinitis   . Anemia   . Arthritis    SHOULDER  . Asthma with bronchitis   . Colon polyp   . COPD (chronic obstructive pulmonary disease) (HCC)    Astmatic bronchitis  . Dysrhythmia 08/2012   NEW ONSET ATRIAL FIBRILATION  . Gout    PMH  . Headache(784.0)   . HLD (hyperlipidemia)   . Nephrolithiasis   . Osteoporosis   . RLS (restless legs syndrome)   . Skin cancer (melanoma) ([place]) 2012   Right neck  . Skin cancer, basal cell    Dr Nevada Crane, [facility]    Past Surgical History:  Procedure Laterality Date  . AORTIC VALVE REPLACEMENT N/A 02/20/2013   Procedure: AORTIC VALVE REPLACEMENT (AVR);  Surgeon: Ivin Poot, MD;  Location: [place];  Service: Open Heart Surgery;  Laterality: N/A;  . COLONOSCOPY W/ POLYPECTOMY     Dr Deatra Ina  . CORONARY ARTERY BYPASS GRAFT N/A 02/20/2013   Procedure: CORONARY ARTERY BYPASS GRAFTING (CABG);  Surgeon: Ivin Poot, MD;  Location: [facility];  Service: Open Heart Surgery;  Laterality: N/A;  . FINGER SURGERY     for foreign body  . INGUINAL HERNIA REPAIR    . INTRAOPERATIVE TRANSESOPHAGEAL ECHOCARDIOGRAM N/A 02/20/2013   Procedure: INTRAOPERATIVE TRANSESOPHAGEAL ECHOCARDIOGRAM;  Surgeon: Ivin Poot, MD;  Location: [place];  Service: Open Heart Surgery;  Laterality: N/A;  . LEFT AND RIGHT HEART CATHETERIZATION WITH CORONARY ANGIOGRAM N/A 01/05/2013   Procedure: LEFT AND RIGHT HEART CATHETERIZATION WITH CORONARY ANGIOGRAM;  Surgeon: Blane Ohara, MD;  Location: [facility] CATH LAB;  Service: Cardiovascular;  Laterality: N/A;  . LEG SURGERY  06/2010    Dr.Lawson, for blood clott. Left leg   . NECK SURGERY  12/2010   Melanoma ; [facility]   . NOSE SURGERY     Septal Deviation  . RIGHT HEART CATHETERIZATION N/A 01/23/2013   Procedure: RIGHT HEART CATH;   Surgeon: Jolaine Artist, MD;  Location: [facility] CATH LAB;  Service: Cardiovascular;  Laterality: N/A;  . TEE WITHOUT CARDIOVERSION N/A 01/23/2013   Procedure: TRANSESOPHAGEAL ECHOCARDIOGRAM (TEE);  Surgeon: Jolaine Artist, MD;  Location: [facility] ENDOSCOPY;  Service: Cardiovascular;  Laterality: N/A;  sign heart cath consent w/tee consent     Social History:  The patient  reports that he quit smoking about 56 years ago. His smoking use included cigarettes. He has a 45.00 pack-year smoking history. He has never used smokeless tobacco. He reports that he does not drink alcohol or use drugs.   Family History:  The patient's family history includes Allergies in his brother, father, mother, and sister; Allergy (severe) in his brother, brother, father, mother, and sister; Asthma in his father; Cancer in his son; Coronary artery disease in his mother; Diabetes in his brother; Emphysema in his brother; Heart attack (age of onset: 33) in his father; Stroke in his father.    ROS:  Please see the history of present illness. All other systems are reviewed and  Negative to the above problem except as noted.    PHYSICAL EXAM: VS:  BP 132/70   Pulse 67   Ht 6\' 1"  (1.854 m)   Wt 63.5 kg (140 lb)   BMI 18.47 kg/m   GEN: Thin 84 yo  in no acute distress  HEENT: normal  Neck: no JVD, carotid bruits, or masses Cardiac:Irreg Ireg    no murmurs, rubs, or gallops,no edema  Respiratory:  clear to auscultation bilaterally, normal work of breathing GI: soft, nontender, nondistended, + BS  No hepatomegaly  MS: no deformity Moving all extremities   Skin: warm and dry, no rash Neuro:  Strength and sensation are intact Psych: euthymic mood, full affect   EKG:  EKG is ordered today.  Atypical atrial flutter    67 bpm    Anteroseptal MI     Lipid Panel    Component Value Date/Time   CHOL 114 05/28/2017 1535   TRIG 91.0 05/28/2017 1535   HDL 35.00 (L) 05/28/2017 1535   CHOLHDL 3 05/28/2017 1535   VLDL 18.2  05/28/2017 1535   LDLCALC 61 05/28/2017 1535      Wt Readings from Last 3 Encounters:  08/16/17 63.5 kg (140 lb)  05/28/17 64.9 kg (143 lb)  11/19/16 61.7 kg (136 lb)      ASSESSMENT AND PLAN:  1  CAD  S/p CABG    I am not convinced of active angina   Continue current meds  I have asked wife to hold am 120 cardiazem to see how  he improves  Call with response    2  Hx AV dz  S/p AVR   NO stenosis on exam    3  Chronic atrial flutter    Continue rate control and anticoag  As above    4  HL  Keep on meds  5  HTN  Follow BP with change in meds    I will f/u in clinic this winter    Current medicines are reviewed at length with the patient today.  The patient does not have concerns regarding medicines.  Signed, Dorris Carnes, MD  08/16/2017 4:03 PM    [facility] [place], [place], [place]  [address] Phone: [phone]; Fax: [phone]

## 2017-08-16 NOTE — Patient Instructions (Signed)
Description   Skip today and tomorrow's dose, then Continue taking 1 tablet daily except 1.5 tablets on Mondays. Recheck in 10 days.  Call us with any medication changes or concerns # [phone] Cou[facility]in Clinic, Main # [phone].

## 2017-08-16 NOTE — Patient Instructions (Signed)
Medication Instructions:  Your physician has recommended you make the following change in your medication:  Stop your Diltiazem but continue to check your BP at home.   Labwork: Your physician recommends that you return for lab work today:  TSH    Testing/Procedures: None ordered    Follow-Up: Your physician recommends that you schedule a follow-up appointment in:  one year with Dr. Harrington Challenger.     Any Other Special Instructions Will Be Listed Below (If Applicable).     If you need a refill on your cardiac medications before your next appointment, please call your pharmacy.

## 2017-08-16 NOTE — Progress Notes (Signed)
prolia Injection given.   Abhishek Levesque J Prentice Sackrider, MD  

## 2017-08-17 LAB — TSH: TSH: 2.58 u[IU]/mL (ref 0.450–4.500)

## 2017-08-19 ENCOUNTER — Encounter: Payer: Self-pay | Admitting: Neurology

## 2017-08-19 ENCOUNTER — Ambulatory Visit (INDEPENDENT_AMBULATORY_CARE_PROVIDER_SITE_OTHER): Payer: Medicare Other | Admitting: Neurology

## 2017-08-19 VITALS — BP 128/70 | HR 66 | Ht 73.0 in | Wt 139.1 lb

## 2017-08-19 DIAGNOSIS — M79605 Pain in left leg: Secondary | ICD-10-CM | POA: Diagnosis not present

## 2017-08-19 DIAGNOSIS — G20 Parkinson's disease: Secondary | ICD-10-CM

## 2017-08-19 DIAGNOSIS — M79604 Pain in right leg: Secondary | ICD-10-CM | POA: Diagnosis not present

## 2017-08-19 MED ORDER — CARBIDOPA-LEVODOPA 25-100 MG PO TABS
1.0000 | ORAL_TABLET | Freq: Three times a day (TID) | ORAL | 2 refills | Status: DC
Start: 2017-08-19 — End: 2019-01-28

## 2017-08-19 MED ORDER — GABAPENTIN 100 MG PO CAPS: 100.0000 mg | ORAL_CAPSULE | Freq: Two times a day (BID) | ORAL | 3 refills | Status: DC

## 2017-08-19 NOTE — Patient Instructions (Addendum)
Start Carbidopa Levodopa as follows at least 30-min prior to meals:     AM  Afternoon   Evening   Week 1:  1/2 tab  1/2 tab   1/2 tab  Week 2:   1/2 tab  1/2 tab   1 tab  Week 3:  1/2 tab  1 tab   1 tab  Week 4:  1 tab  1 tab   1 tab  *Avoid taking with protein products, such as milk, meat, cheese  *if you develop nausea, take with crackers  Continue gabapentin 100mg  twice daily  Start home exercises and use your stationary bike  Return to clinic on October 4th at 3:30pm

## 2017-08-19 NOTE — Progress Notes (Signed)
Follow-up Visit   Date: 08/19/17    Robert Richard MRN: [id number] DOB: Aug 01, 1930   Interim History: Robert Richard is a 84 y.o. right-handed Caucasian male with CAD s/p CABG, PAF, PVD, HPL, HTN, RLS, anemia, asthma, COPD, history of cerebellar stroke (2015), and CKD returning to the clinic for follow-up of bilateral leg pain.  The patient was accompanied to the clinic by wife who also provides collateral information.    History of present illness: For the past 5-10 years, he had had dull achy pain of the lower legs from the knee down.  He denies numbness, burning, stabbing, or tingling. Pain is present at rest and with activity and there is nothing that makes it better.  Sometimes, walking can help but not always. It is worse during the daytime.  He does not have discomfort such as crawling sensation of the legs. There is no muscle tenderness.  He has arterial studies of the legs which were normal.  His has some imbalance and has been walking with a cane for the past 3 years.  He has not suffered any recent falls.  He also has generalized weakness of the arm and legs.  He was much more active prior to his CABG, but since this time, he has become sedentary.  UPDATE 11/19/2016:   He is very pleased with the relief he gets with gabapentin, because since starting gabapentin 100mg  twice daily, his leg pain has completely resolved.  He has lost 14lb in the past several months and despite him trying to eat more calories, his weight continues to decline.  Review of his medication shows topiramate 75mg  daily which he takes for migraines for the past 2-3 years written by Dr. Domingo Cocking.  Fortunately, his migraines are extremely well-controlled.  UPDATE 08/19/2017:  He is here for 9 month follow-up visit.  His leg pain continues to be well-controlled on gabapentin 100mg  twice daily.  His wife has noticed that he tends to walk with shorter steps and appears stiff.  He is less active and not been compliant with  his home PT exercises or using his stationary bike. He has not suffered any falls and always uses a cane when out side of the home.    Medications:  Current Outpatient Medications on File Prior to Visit  Medication Sig Dispense Refill  . busPIRone (BUSPAR) 10 MG tablet TAKE TWO (2) TABLETS BY MOUTH 2 TIMES DAILY 120 tablet 1  . CARTIA XT 240 MG 24 hr capsule TAKE ONE CAPSULE BY MOUTH DAILY 30 capsule 0  . ketotifen (ZADITOR) 0.025 % ophthalmic solution Place 1 drop into both eyes daily.     . Lidocaine HCl 4 % SOLN Apply 4 mLs topically as needed (for migraines).     . Multiple Vitamins-Minerals (DAILY MULTIVITAMIN) CAPS Take 1 capsule by mouth daily.     . nitroGLYCERIN (NITROSTAT) 0.4 MG SL tablet Place 0.4 mg under the tongue every 5 (five) minutes as needed for chest pain.    . polyethylene glycol (MIRALAX / GLYCOLAX) packet Take 17 g by mouth daily. 14 each 0  . pravastatin (PRAVACHOL) 40 MG tablet TAKE ONE TABLET EACH DAY 90 tablet 1  . ranitidine (ZANTAC) 150 MG tablet TAKE ONE TABLET BY MOUTH TWICE A DAY 180 tablet 2  . RAPAFLO 8 MG CAPS capsule     . topiramate (TOPAMAX) 25 MG tablet Take 75 mg by mouth daily.    Marland Kitchen warfarin (COUMADIN) 2 MG tablet TAKE ONE TO  ONE AND ONE-HALF TABLETS BY MOUTH DAILY OR AS DIRECTED BYCOUMADIN CLINIC 40 tablet 3   No current facility-administered medications on file prior to visit.     Allergies:  Allergies  Allergen Reactions  . Iodinated Diagnostic Agents Hives    Other reaction(s): RASH Other reaction(s): RASH  . Ioxaglate Hives    Other reaction(s): RASH    Review of Systems:  CONSTITUTIONAL: No fevers, chills, night sweats, +weight loss.  EYES: No visual changes or eye pain ENT: No hearing changes.  No history of nose bleeds.   RESPIRATORY: No cough, wheezing and shortness of breath.   CARDIOVASCULAR: Negative for chest pain, and palpitations.   GI: Negative for abdominal discomfort, blood in stools or black stools.  No recent change in  bowel habits.   GU:  No history of incontinence.   MUSCLOSKELETAL: No history of joint pain or swelling.  No myalgias.   SKIN: Negative for lesions, rash, and itching.   ENDOCRINE: Negative for cold or heat intolerance, polydipsia or goiter.   PSYCH:  No depression or anxiety symptoms.   NEURO: As Above.   Vital Signs:  BP 128/70   Pulse 66   Ht 6\' 1"  (1.854 m)   Wt 139 lb 2 oz (63.1 kg)   SpO2 98%   BMI 18.36 kg/m    General Medical Exam:   General:  Thin-appearing, comfortable  Eyes/ENT: see cranial nerve examination.   Neck: .  No carotid bruits. Respiratory:  Clear to auscultation, good air entry bilaterally.   Cardiac:  Regular rate and rhythm, no murmur.   Ext:  No edema  Neurological Exam: MENTAL STATUS including orientation to time, place, person, recent and remote memory, attention span and concentration, language, and fund of knowledge is normal.  Speech is not dysarthric.  CRANIAL NERVES:  Face is symmetric. Reduced blink.  Blunted affect  MOTOR:  Motor strength is 5/5 in all extremities.  Generalized loss of muscle bulk throughout. Tone is mildly increased in the arms  MSRs:  Reflexes are 2+/4 throughout, except 1+/4 at the ankles bilaterally.  SENSORY:  Vibration is reduced at the ankles bilaterally.  COORDINATION/GAIT: There is reduced amplitude with finger and toe tapping bilaterally.   Gait mildly unsteady, short cadence, turns with 3 steps, assisted with cane.  Poor arm swing  Data: Vascular studies of the legs 06/01/2016: No evidence of segmental lower extremity arterial disease at rest, bilaterally. ABI's are inaccurate; non compressible arteries due to medial calcifications. Abnormal right great toe pressure. Normal left great toe pressure  IMPRESSION/PLAN: 1.  Bilateral achy leg pain, responsive to gabapentin 100mg  twice daily, which will be continued.  Atypical RLS is another possibility.   2.  Parkinsonism.  Exam does not show tremor, but there  is increased tone in the arm and reduced amplitude with finger tapping, along with shuffling gait.  I will offer a trial of sinemet 25/100 half tab TID then titrate to 1 tab TID.  Side effects discussed.  Encouraged to use his stationary bike and continue home PT exercises.    2.  Unintentional weight loss.  Encouraged him to discuss with Dr. Domingo Cocking about reducing his topiramate as this has know side effect of weight loss and his headaches are very well-controlled.     Return to clinic in 3 months  Greater than 50% of this 25 minute visit was spent in counseling, explanation of diagnosis, planning of further management, and coordination of care.   Thank you for allowing me  to participate in patient's care.  If I can answer any additional questions, I would be pleased to do so.    Sincerely,    Donika K. Posey Pronto, DO

## 2017-08-26 ENCOUNTER — Ambulatory Visit: Payer: Medicare Other | Admitting: Pharmacist

## 2017-08-26 DIAGNOSIS — Z952 Presence of prosthetic heart valve: Secondary | ICD-10-CM | POA: Diagnosis not present

## 2017-08-26 DIAGNOSIS — I4891 Unspecified atrial fibrillation: Secondary | ICD-10-CM | POA: Diagnosis not present

## 2017-08-26 DIAGNOSIS — Z5181 Encounter for therapeutic drug level monitoring: Secondary | ICD-10-CM

## 2017-08-26 LAB — POCT INR: INR: 2.3 (ref 2.0–3.0)

## 2017-08-26 NOTE — Patient Instructions (Signed)
Description   Continue taking 1 tablet daily except 1.5 tablets on Mondays. Recheck in 2 week.  Call us with any medication changes or concerns # [phone] [facility] Main # [phone].

## 2017-09-02 ENCOUNTER — Other Ambulatory Visit: Payer: Self-pay | Admitting: Internal Medicine

## 2017-09-09 ENCOUNTER — Ambulatory Visit (INDEPENDENT_AMBULATORY_CARE_PROVIDER_SITE_OTHER): Payer: Medicare Other | Admitting: Pharmacist

## 2017-09-09 DIAGNOSIS — I4891 Unspecified atrial fibrillation: Secondary | ICD-10-CM | POA: Diagnosis not present

## 2017-09-09 DIAGNOSIS — Z952 Presence of prosthetic heart valve: Secondary | ICD-10-CM

## 2017-09-09 DIAGNOSIS — Z5181 Encounter for therapeutic drug level monitoring: Secondary | ICD-10-CM

## 2017-09-09 LAB — POCT INR: INR: 3 (ref 2.0–3.0)

## 2017-09-09 NOTE — Patient Instructions (Signed)
Description   Change dose to 1 tablet daily. Recheck in 2 week.  Call us with any medication changes or concerns # [phone] [facility] Main # [phone].

## 2017-09-11 ENCOUNTER — Other Ambulatory Visit: Payer: Self-pay | Admitting: Internal Medicine

## 2017-09-23 DIAGNOSIS — C434 Malignant melanoma of scalp and neck: Secondary | ICD-10-CM | POA: Diagnosis not present

## 2017-09-25 DIAGNOSIS — R3915 Urgency of urination: Secondary | ICD-10-CM | POA: Diagnosis not present

## 2017-09-25 DIAGNOSIS — N401 Enlarged prostate with lower urinary tract symptoms: Secondary | ICD-10-CM | POA: Diagnosis not present

## 2017-09-26 ENCOUNTER — Ambulatory Visit: Payer: Medicare Other | Admitting: *Deleted

## 2017-09-26 DIAGNOSIS — I4891 Unspecified atrial fibrillation: Secondary | ICD-10-CM | POA: Diagnosis not present

## 2017-09-26 DIAGNOSIS — Z5181 Encounter for therapeutic drug level monitoring: Secondary | ICD-10-CM

## 2017-09-26 DIAGNOSIS — Z952 Presence of prosthetic heart valve: Secondary | ICD-10-CM

## 2017-09-26 LAB — POCT INR: INR: 2.7 (ref 2.0–3.0)

## 2017-09-26 NOTE — Patient Instructions (Signed)
Description   Continue taking 1 tablet daily. Recheck in 3 week.  Call us with any medication changes or concerns # [phone] [facility], Main # [phone].

## 2017-10-14 ENCOUNTER — Telehealth: Payer: Self-pay | Admitting: Neurology

## 2017-10-14 NOTE — Telephone Encounter (Signed)
Patients wife calling to discuss medication sinemet IR. Patients wife states medication worked for a couple weeks but in the past week is made the pt really confused, so he stopped taking it. Pt wife wanting to know where to go from here.

## 2017-10-14 NOTE — Telephone Encounter (Signed)
Please advise 

## 2017-10-14 NOTE — Telephone Encounter (Signed)
Left message giving patient's wife instructions and requested for her to call PCP if UA needs to be done.

## 2017-10-14 NOTE — Telephone Encounter (Signed)
I would recommend that he go back to taking half tablet three times daily.  Confusion can be due to many reasons, such as infection - does he have any cough, UTI symptoms?  If confusion continues, need to check urinalysis.  Donika K. Posey Pronto, DO

## 2017-10-15 ENCOUNTER — Ambulatory Visit: Payer: Medicare Other | Admitting: Internal Medicine

## 2017-10-15 ENCOUNTER — Ambulatory Visit: Payer: Self-pay | Admitting: Internal Medicine

## 2017-10-15 ENCOUNTER — Ambulatory Visit (INDEPENDENT_AMBULATORY_CARE_PROVIDER_SITE_OTHER)
Admission: RE | Admit: 2017-10-15 | Discharge: 2017-10-15 | Disposition: A | Discharge status: Home / Self Care | Payer: Medicare Other | Source: Ambulatory Visit | Attending: Internal Medicine | Admitting: Internal Medicine

## 2017-10-15 ENCOUNTER — Other Ambulatory Visit (INDEPENDENT_AMBULATORY_CARE_PROVIDER_SITE_OTHER): Payer: Medicare Other

## 2017-10-15 ENCOUNTER — Encounter: Payer: Self-pay | Admitting: Internal Medicine

## 2017-10-15 VITALS — BP 136/78 | HR 67 | Temp 97.5°F | Resp 16 | Ht 73.0 in | Wt 141.0 lb

## 2017-10-15 DIAGNOSIS — R05 Cough: Secondary | ICD-10-CM

## 2017-10-15 DIAGNOSIS — E7849 Other hyperlipidemia: Secondary | ICD-10-CM

## 2017-10-15 DIAGNOSIS — I10 Essential (primary) hypertension: Secondary | ICD-10-CM

## 2017-10-15 DIAGNOSIS — R410 Disorientation, unspecified: Secondary | ICD-10-CM | POA: Diagnosis not present

## 2017-10-15 DIAGNOSIS — R059 Cough, unspecified: Secondary | ICD-10-CM

## 2017-10-15 DIAGNOSIS — R7303 Prediabetes: Secondary | ICD-10-CM

## 2017-10-15 DIAGNOSIS — F419 Anxiety disorder, unspecified: Secondary | ICD-10-CM

## 2017-10-15 LAB — CBC WITH DIFFERENTIAL/PLATELET
Basophils Absolute: 0 10*3/uL (ref 0.0–0.1)
Basophils Relative: 0.6 % (ref 0.0–3.0)
Eosinophils Absolute: 0.2 10*3/uL (ref 0.0–0.7)
Eosinophils Relative: 2.1 % (ref 0.0–5.0)
HCT: 33.9 % — ABNORMAL LOW (ref 39.0–52.0)
Hemoglobin: 11.2 g/dL — ABNORMAL LOW (ref 13.0–17.0)
Lymphocytes Relative: 22.8 % (ref 12.0–46.0)
Lymphs Abs: 1.9 10*3/uL (ref 0.7–4.0)
MCHC: 33.2 g/dL (ref 30.0–36.0)
MCV: 90.5 fl (ref 78.0–100.0)
Monocytes Absolute: 1.1 10*3/uL — ABNORMAL HIGH (ref 0.1–1.0)
Monocytes Relative: 13.5 % — ABNORMAL HIGH (ref 3.0–12.0)
Neutro Abs: 5.1 10*3/uL (ref 1.4–7.7)
Neutrophils Relative %: 61 % (ref 43.0–77.0)
Platelets: 175 10*3/uL (ref 150.0–400.0)
RBC: 3.74 Mil/uL — ABNORMAL LOW (ref 4.22–5.81)
RDW: 14 % (ref 11.5–15.5)
WBC: 8.3 10*3/uL (ref 4.0–10.5)

## 2017-10-15 LAB — LIPID PANEL
Cholesterol: 103 mg/dL (ref 0–200)
HDL: 31.4 mg/dL — ABNORMAL LOW (ref >39.00)
LDL Cholesterol: 46 mg/dL (ref 0–99)
NonHDL: 71.66
Total CHOL/HDL Ratio: 3
Triglycerides: 126 mg/dL (ref 0.0–149.0)
VLDL: 25.2 mg/dL (ref 0.0–40.0)

## 2017-10-15 LAB — COMPREHENSIVE METABOLIC PANEL
ALT: 6 U/L (ref 0–53)
AST: 15 U/L (ref 0–37)
Albumin: 4 g/dL (ref 3.5–5.2)
Alkaline Phosphatase: 82 U/L (ref 39–117)
BUN: 25 mg/dL — ABNORMAL HIGH (ref 6–23)
CO2: 23 mEq/L (ref 19–32)
Calcium: 9.4 mg/dL (ref 8.4–10.5)
Chloride: 112 mEq/L (ref 96–112)
Creatinine, Ser: 1.45 mg/dL (ref 0.40–1.50)
GFR: 49.12 mL/min — ABNORMAL LOW (ref >60.00)
Glucose, Bld: 125 mg/dL — ABNORMAL HIGH (ref 70–99)
Potassium: 4.2 mEq/L (ref 3.5–5.1)
Sodium: 140 mEq/L (ref 135–145)
Total Bilirubin: 0.4 mg/dL (ref 0.2–1.2)
Total Protein: 7.8 g/dL (ref 6.0–8.3)

## 2017-10-15 LAB — HEMOGLOBIN A1C: Hgb A1c MFr Bld: 6.1 % (ref 4.6–6.5)

## 2017-10-15 LAB — TSH: TSH: 1.67 u[IU]/mL (ref 0.35–4.50)

## 2017-10-15 MED ORDER — BUSPIRONE HCL 15 MG PO TABS: 15.0000 mg | ORAL_TABLET | Freq: Two times a day (BID) | ORAL | Status: DC

## 2017-10-15 NOTE — Assessment & Plan Note (Signed)
A1c

## 2017-10-15 NOTE — Telephone Encounter (Signed)
Wife called to report concerns that pt. "seems different" over past week.  Reported when she talks to him, he just looks at her, "as if things aren't getting through."  Stated the pt. doesn't offer any complaints.  Wife noted intermittent slurred speech over past week. Reported intermittent twitching of upper body since yesterday.  Reported he "has had balance issues for awhile."  Denied noting any weakness or difficulty using/moving his arms or legs.  Reported she thinks he is aware of his surroundings, but has some confusion/ forgetfulness at times over past week.  Stated he is very close to his sister, and recently would hardly talk to her on the phone, and then forgot she was even on the phone.  Stated he was placed on new medication, Sinemet, for his stiff gait, per Neurology.  Stated he seemed much better the initial 3-4 weeks of taking it.  Stated she held the medication as of Friday, due to change in his response, and reported this to Neurologist yesterday.  Was advised to decrease the dose of Sinemet 100 mg TID, to 1/2 tab TID.  Resumed medication with new dose yesterday.  Requested to see Dr. Quay Burow today.  Called FC at office.  Was advised to schedule appt. with Dr. Quay Burow this AM, at 10:30; pt's wife felt it would be difficult to get there that quick.  Appt. Given for 2:30 PM; advised if pt's symptoms worsen, to call EMS or go to nearest ER.  Wife verb. Understanding. Agrees with plan.      Reason for Disposition . [1] Loss of speech or garbled speech AND [2] gradual onset (e.g., days to weeks) AND [3] present now    Reported pt. Has slower response, decrease in verbal interaction, intermittent slurred speech, and intermittent confusion x 1 week.  Started new medication for stiff gait about one month ago; Sinemet.  Answer Assessment - Initial Assessment Questions 1. SYMPTOM: "What is the main symptom you are concerned about?" (e.g., weakness, numbness)     Pt. Is acting differently; doesn't  respond well when spoken to; looks at wife like things aren't getting through, some increased confusion 2. ONSET: "When did this start?" (minutes, hours, days; while sleeping)     About one week ago 3. LAST NORMAL: "When was the last time you were normal (no symptoms)?"     A week ago on Sunday 4. PATTERN "Does this come and go, or has it been constant since it started?"  "Is it present now?"     Steady but seems worse at times  5. CARDIAC SYMPTOMS: "Have you had any of the following symptoms: chest pain, difficulty breathing, palpitations?"     Wife denied noting any chest symptoms, or shortness of breath.   6. NEUROLOGIC SYMPTOMS: "Have you had any of the following symptoms: headache, dizziness, vision loss, double vision, changes in speech, unsteady on your feet?"    No c/o headache; denied facial droop; some balance issues for awhile;  some slurring of speech, increased twitching of upper body x 1 wk.     7. OTHER SYMPTOMS: "Do you have any other symptoms?"     Some confusion, some difficulty with swallowing, ie, gulp and then a hiccup/ burp.  Wife verb. concern about medication reaction.  Protocols used: NEUROLOGIC DEFICIT-A-AH

## 2017-10-15 NOTE — Progress Notes (Signed)
Subjective:    Patient ID: Robert Richard, male    DOB: 11/04/1930, 84 y.o.   MRN: [id number]  HPI The patient is here for an acute visit.  He was brought here by his wife.  He does have dementia and she supplements the history.  He has not been feeling well for at least one week - does not seem to be himself. At night he has muscle twitching which is new.  Sometimes when his wife talks to him he looks like he does not register what she is saying. He woke up Saturday and said he was confused.  That is not new-he does have intermittent confusion, but it was worse on Saturday.  His wife feels like when he takes his medication-the Sinemet, he is a little confused, sweaty , clammy.  His wife talked to the nurse at Dr. Serita Grit office and the medication was cut back to 1/2 of a pill today.   He occasional has slurred speech - that is not new - he has had that intermittently.    He likes to sleep a lot.  He has no strength and does not walk well  No change in balance in the past week.  No focal weakness.  He is not currently exercising regularly, but not long ago he was riding the stationary bike daily-they cannot remember the last time he wrote this, but it was within the last few weeks.  He has dementia and is a poor historian.  He is not able to provide much information.  He feels like he has not been up to par. he denies any specific complaints.  There have been no other lifestyle, diet or medication changes in the past couple of weeks.  He is taking all of his medication as prescribed.  Medications and allergies reviewed with patient and updated if appropriate.  Patient Active Problem List   Diagnosis Date Noted  . Anxiety 11/05/2016  . Urinary frequency 09/12/2016  . Cough 09/12/2016  . Prediabetes 05/01/2016  . Pain in both lower extremities 05/01/2016  . Bilateral hearing loss 05/01/2016  . Migraine 04/26/2015  . Aortic valve replaced 04/21/2015  . Subacute confusional state 12/14/2014   . Melanoma of neck ([place]) 07/05/2014  . Dyspnea 09/16/2013  . Pulmonary infiltrates 07/31/2013  . Encounter for therapeutic drug monitoring 03/20/2013  . CAD (coronary artery disease) 03/13/2013  . Atrial fibrillation, controlled ([place]) 03/13/2013  . Protein-calorie malnutrition, severe ([place]) 02/27/2013  . S/P CABG x 3 02/20/2013  . History of embolectomy 09/22/2012  . CKD (chronic kidney disease) 09/22/2012  . Syncope 09/15/2012  . Melanoma ([place]) 08/26/2012  . Peripheral vascular disease, unspecified ([place]) 07/08/2012  . Mitral stenosis 07/27/2010  . Hyperlipidemia 10/25/2009  . Essential hypertension 10/25/2009  . NEPHROLITHIASIS, HX OF 10/25/2009  . SLEEP DISORDER 11/29/2008  . SNORING 10/14/2008  . APNEA 10/05/2008  . SKIN CANCER, HX OF 10/05/2008  . RESTLESS LEG SYNDROME 09/11/2007  . Allergic rhinitis, cause unspecified 09/11/2007  . GOUT 03/15/2006  . ASTHMA 03/15/2006  . COPD 03/15/2006  . Osteoporosis 03/15/2006  . COLONIC POLYPS, HX OF 03/15/2006    Current Outpatient Medications on File Prior to Visit  Medication Sig Dispense Refill  . busPIRone (BUSPAR) 10 MG tablet TAKE TWO (2) TABLETS BY MOUTH 2 TIMES DAILY 120 tablet 1  . busPIRone (BUSPAR) 10 MG tablet TAKE (2) TABLETS BY MOUTH TWICE DAILY. 120 tablet 2  . carbidopa-levodopa (SINEMET IR) 25-100 MG tablet Take 1 tablet by  mouth 3 (three) times daily. 90 tablet 2  . CARTIA XT 240 MG 24 hr capsule TAKE ONE CAPSULE BY MOUTH DAILY 90 capsule 3  . gabapentin (NEURONTIN) 100 MG capsule Take 1 capsule (100 mg total) by mouth 2 (two) times daily. 180 capsule 3  . ketotifen (ZADITOR) 0.025 % ophthalmic solution Place 1 drop into both eyes daily.     . Lidocaine HCl 4 % SOLN Apply 4 mLs topically as needed (for migraines).     . Multiple Vitamins-Minerals (DAILY MULTIVITAMIN) CAPS Take 1 capsule by mouth daily.     . nitroGLYCERIN (NITROSTAT) 0.4 MG SL tablet Place 0.4 mg under the tongue every 5 (five) minutes as needed  for chest pain.    . polyethylene glycol (MIRALAX / GLYCOLAX) packet Take 17 g by mouth daily. 14 each 0  . pravastatin (PRAVACHOL) 40 MG tablet TAKE ONE TABLET EACH DAY 90 tablet 1  . ranitidine (ZANTAC) 150 MG tablet TAKE ONE TABLET BY MOUTH TWICE A DAY 180 tablet 2  . topiramate (TOPAMAX) 25 MG tablet Take 75 mg by mouth daily.    Marland Kitchen warfarin (COUMADIN) 2 MG tablet TAKE 1 TABLET TO 1 AND 1/2 TABLETS BY MOUTH DAILY OR AS DIRECTED BY COUMADIN CLINIC 40 tablet 2   No current facility-administered medications on file prior to visit.     Past Medical History:  Diagnosis Date  . Allergic rhinitis   . Anemia   . Arthritis    SHOULDER  . Asthma with bronchitis   . Colon polyp   . COPD (chronic obstructive pulmonary disease) (HCC)    Astmatic bronchitis  . Dysrhythmia 08/2012   NEW ONSET ATRIAL FIBRILATION  . Gout    PMH  . Headache(784.0)   . HLD (hyperlipidemia)   . Nephrolithiasis   . Osteoporosis   . RLS (restless legs syndrome)   . Skin cancer (melanoma) ([place]) 2012   Right neck  . Skin cancer, basal cell    Dr Nevada Crane, [facility]    Past Surgical History:  Procedure Laterality Date  . AORTIC VALVE REPLACEMENT N/A 02/20/2013   Procedure: AORTIC VALVE REPLACEMENT (AVR);  Surgeon: Ivin Poot, MD;  Location: [place];  Service: Open Heart Surgery;  Laterality: N/A;  . COLONOSCOPY W/ POLYPECTOMY     Dr Deatra Ina  . CORONARY ARTERY BYPASS GRAFT N/A 02/20/2013   Procedure: CORONARY ARTERY BYPASS GRAFTING (CABG);  Surgeon: Ivin Poot, MD;  Location: [place];  Service: Open Heart Surgery;  Laterality: N/A;  . FINGER SURGERY     for foreign body  . INGUINAL HERNIA REPAIR    . INTRAOPERATIVE TRANSESOPHAGEAL ECHOCARDIOGRAM N/A 02/20/2013   Procedure: INTRAOPERATIVE TRANSESOPHAGEAL ECHOCARDIOGRAM;  Surgeon: Ivin Poot, MD;  Location: [place];  Service: Open Heart Surgery;  Laterality: N/A;  . LEFT AND RIGHT HEART CATHETERIZATION WITH CORONARY ANGIOGRAM N/A 01/05/2013   Procedure: LEFT AND  RIGHT HEART CATHETERIZATION WITH CORONARY ANGIOGRAM;  Surgeon: Blane Ohara, MD;  Location[facility] C[facility] LAB;  Service: Cardiovascular;  Laterality: N/A;  . LEG SURGERY  06/2010    Dr.Lawson, for blood clott. Left leg   . NECK SURGERY  12/2010   Melanoma ; [facility]   . NOSE SURGERY     Septal Deviation  . RIGHT HEART CATHETERIZATION N/A 01/23/2013   Procedure: RIGHT HEART CATH;  Surgeon: Jolaine Artist, MD;  Location: [facility] CATH LAB;  Service: Cardiovascular;  Laterality: N/A;  . TEE WITHOUT CARDIOVERSION N/A 01/23/2013   Procedure: TRANSESOPHAGEAL  ECHOCARDIOGRAM (TEE);  Surgeon: Jolaine Artist, MD;  Location: [facility] ENDOSCOPY;  Service: Cardiovascular;  Laterality: N/A;  sign heart cath consent w/tee consent    Social History   Socioeconomic History  . Marital status: Married    Spouse name: Not on file  . Number of children: Not on file  . Years of education: Not on file  . Highest education level: Not on file  Occupational History  . Occupation: retired    Fish farm manager: [employer]  . Financial resource strain: Not on file  . Food insecurity:    Worry: Not on file    Inability: Not on file  . Transportation needs:    Medical: Not on file    Non-medical: Not on file  Tobacco Use  . Smoking status: Former Smoker    Packs/day: 3.00    Years: 15.00    Pack years: 45.00    Types: Cigarettes    Last attempt to quit: 02/19/1961    Years since quitting: 56.6  . Smokeless tobacco: Never Used  Substance and Sexual Activity  . Alcohol use: No  . Drug use: No  . Sexual activity: Not on file  Lifestyle  . Physical activity:    Days per week: Not on file    Minutes per session: Not on file  . Stress: Not on file  Relationships  . Social connections:    Talks on phone: Not on file    Gets together: Not on file    Attends religious service: Not on file    Active member of club or organization: Not on file    Attends meetings of clubs or organizations: Not  on file    Relationship status: Not on file  Other Topics Concern  . Not on file  Social History Narrative  . Not on file    Family History  Problem Relation Age of Onset  . Allergies Mother   . Coronary artery disease Mother   . Allergy (severe) Mother   . Allergies Father   . Stroke Father   . Heart attack Father 60  . Asthma Father   . Allergy (severe) Father   . Allergies Sister   . Allergy (severe) Sister   . Allergies Brother        x2  . Allergy (severe) Brother   . Diabetes Brother   . Allergy (severe) Brother   . Emphysema Brother        smoked  . Cancer Son    ROS from wife and patient Review of Systems  Constitutional: Positive for appetite change (decreased, varies) and fatigue. Negative for chills and fever.  Respiratory: Negative for cough, shortness of breath and wheezing.   Cardiovascular: Negative for chest pain, palpitations and leg swelling.  Gastrointestinal: Positive for constipation (controlled). Negative for abdominal pain.  Genitourinary: Negative for dysuria and hematuria.  Skin: Negative for rash.  Neurological: Negative for dizziness, light-headedness and headaches.  Psychiatric/Behavioral: Positive for confusion.       Objective:   Vitals:   10/15/17 1422  BP: 136/78  Pulse: 67  Resp: 16  Temp: (!) 97.5 F (36.4 C)  SpO2: 97%   BP Readings from Last 3 Encounters:  10/15/17 136/78  08/19/17 128/70  08/16/17 132/70   Wt Readings from Last 3 Encounters:  10/15/17 141 lb (64 kg)  08/19/17 139 lb 2 oz (63.1 kg)  08/16/17 140 lb (63.5 kg)   Body mass index is 18.6 kg/m.   Physical  Exam  Constitutional: No distress.  Chronically ill-appearing  HENT:  Head: Normocephalic and atraumatic.  Right Ear: External ear normal.  Left Ear: External ear normal.  Nose: Nose normal.  Mouth/Throat: Oropharynx is clear and moist.  Eyes: Conjunctivae are normal.  Neck: Neck supple. No tracheal deviation present. No thyromegaly present.    Cardiovascular: Normal rate.  Murmur (2/6 systolic) heard. Irregular rhythm  Pulmonary/Chest: Effort normal and breath sounds normal. No respiratory distress. He has no wheezes. He has no rales.  Abdominal: Soft. He exhibits no distension. There is no tenderness.  Musculoskeletal: He exhibits no edema.  Lymphadenopathy:    He has no cervical adenopathy.  Neurological: He is alert.  Normal sensation all extremities, no focal weakness, but generalized weakness  Skin: Skin is warm and dry. No rash noted. He is not diaphoretic.           Assessment & Plan:    See Problem List for Assessment and Plan of chronic medical problems.

## 2017-10-15 NOTE — Assessment & Plan Note (Signed)
Taking pravastatin daily Check lipid panel, CMP

## 2017-10-15 NOTE — Assessment & Plan Note (Signed)
Blood pressure controlled. Continue current medication.

## 2017-10-15 NOTE — Assessment & Plan Note (Signed)
Experiencing a dry cough Will check chest x-ray and blood work Cough is a little bit more chronic than acute, but will rule out infection

## 2017-10-15 NOTE — Assessment & Plan Note (Signed)
Controlled Trial of decreasing buspar to 15 mg bid Fu in october

## 2017-10-15 NOTE — Patient Instructions (Addendum)
  Test(s) ordered today. Your results will be released to [place] (or called to you) after review, usually within 72hours after test completion. If any changes need to be made, you will be notified at that same time.   Medications reviewed and updated.  Changes include decreasing buspar to 15 mg twice daily.    Please followup in October as scheduled

## 2017-10-15 NOTE — Assessment & Plan Note (Addendum)
He does experience intermittent confusion and few days ago the confusion was slightly worse He does not appear confused today and his wife states the confusion is not persistent Possibly progression of his memory difficulties versus medication side effect versus infection We will rule out infection with blood work, urine and chest x-ray Sinemet recently decreased to see if this was a side effect from the medication Advised to follow-up with Dr. Posey Pronto Neurological exam is nonfocal so I will hold off on imaging

## 2017-10-16 ENCOUNTER — Ambulatory Visit: Payer: Medicare Other | Admitting: *Deleted

## 2017-10-16 ENCOUNTER — Other Ambulatory Visit: Payer: Self-pay | Admitting: Internal Medicine

## 2017-10-16 ENCOUNTER — Other Ambulatory Visit (INDEPENDENT_AMBULATORY_CARE_PROVIDER_SITE_OTHER): Payer: Medicare Other

## 2017-10-16 DIAGNOSIS — Z5181 Encounter for therapeutic drug level monitoring: Secondary | ICD-10-CM | POA: Diagnosis not present

## 2017-10-16 DIAGNOSIS — R410 Disorientation, unspecified: Secondary | ICD-10-CM

## 2017-10-16 DIAGNOSIS — Z952 Presence of prosthetic heart valve: Secondary | ICD-10-CM | POA: Diagnosis not present

## 2017-10-16 DIAGNOSIS — I4891 Unspecified atrial fibrillation: Secondary | ICD-10-CM | POA: Diagnosis not present

## 2017-10-16 LAB — URINALYSIS, ROUTINE W REFLEX MICROSCOPIC
Bilirubin Urine: NEGATIVE
Hgb urine dipstick: NEGATIVE
Ketones, ur: NEGATIVE
Leukocytes, UA: NEGATIVE
Nitrite: NEGATIVE
Specific Gravity, Urine: 1.025 (ref 1.000–1.030)
Total Protein, Urine: 30 — AB
Urine Glucose: NEGATIVE
Urobilinogen, UA: 0.2 (ref 0.0–1.0)
pH: 6.5 (ref 5.0–8.0)

## 2017-10-16 LAB — POCT INR: INR: 2.9 (ref 2.0–3.0)

## 2017-10-16 NOTE — Telephone Encounter (Signed)
Resend Buspar to pharmacy, it was not received on 10/15/17. Last OV 10/15/17.

## 2017-10-16 NOTE — Telephone Encounter (Signed)
Copied from [place] [id number]. Topic: Quick Communication - Rx Refill/Question >> Oct 16, 2017  5:14 PM Yvette Rack wrote: [facility] called stating the pt is at pharmacy and the Rx for busPIRone (BUSPAR) 15 MG tablet was not received. Pt advised Pharmacist that the Rx was not printed.   Medication: busPIRone (BUSPAR) 15 MG tablet  Has the patient contacted their pharmacy? yes   Preferred Pharmacy (with phone number or street name):  [place], [place] [phone] (Phone) [phone] (Fax)  Agent: Please be advised that RX refills may take up to 3 business days. We ask that you follow-up with your pharmacy.

## 2017-10-16 NOTE — Patient Instructions (Signed)
Description   Continue taking 1 tablet daily. Recheck in 4 weeks.  Call us with any medication changes or concerns # [phone] [facility], Main # [phone].

## 2017-10-17 LAB — URINE CULTURE
MICRO NUMBER:: 91029758
SPECIMEN QUALITY:: ADEQUATE

## 2017-10-17 MED ORDER — BUSPIRONE HCL 15 MG PO TABS: 15.0000 mg | ORAL_TABLET | Freq: Two times a day (BID) | ORAL | 1 refills | Status: DC

## 2017-10-17 NOTE — Telephone Encounter (Signed)
RX says no print, please advise

## 2017-10-17 NOTE — Telephone Encounter (Signed)
Pt.notified

## 2017-10-27 NOTE — Progress Notes (Signed)
Follow-up Visit   Date: 10/28/17    Robert Richard MRN: [id number] DOB: Aug 29, 1930   Interim History: Robert Richard is a 84 y.o. right-handed Caucasian male with CAD s/p CABG, PAF, PVD, HPL, HTN, RLS, anemia, asthma, COPD, history of cerebellar stroke (2015), and CKD returning to the clinic for follow-up parkinsonism and bilateral leg pain.  The patient was accompanied to the clinic by wife who also provides collateral information.    History of present illness: For the past 5-10 years, he had had dull achy pain of the lower legs from the knee down.  He denies numbness, burning, stabbing, or tingling. Pain is present at rest and with activity and there is nothing that makes it better.  Sometimes, walking can help but not always. It is worse during the daytime.  He does not have discomfort such as crawling sensation of the legs. There is no muscle tenderness.  He has arterial studies of the legs which were normal.  His has some imbalance and has been walking with a cane for the past 3 years.  He has not suffered any recent falls.  He also has generalized weakness of the arm and legs.  He was much more active prior to his CABG, but since this time, he has become sedentary.  UPDATE 11/19/2016:   He is very pleased with the relief he gets with gabapentin, because since starting gabapentin 100mg  twice daily, his leg pain has completely resolved.  He has lost 14lb in the past several months and despite him trying to eat more calories, his weight continues to decline.  Review of his medication shows topiramate 75mg  daily which he takes for migraines for the past 2-3 years written by Dr. Domingo Richard.  Fortunately, his migraines are extremely well-controlled.  UPDATE 08/19/2017:  He is here for 9 month follow-up visit.  His leg pain continues to be well-controlled on gabapentin 100mg  twice daily.  His wife has noticed that he tends to walk with shorter steps and appears stiff.  He is less active and not been  compliant with his home PT exercises or using his stationary bike. He has not suffered any falls and always uses a cane when out side of the home.    UPDATE 10/27/2017:  He is here for sooner than scheduled visit due to questions regarding medication dose.  At his last visit, I started him on sinemet due to concern of parkinsonism and wife noticed improved gait, energy, and movements were better.  He took sinemet 1 tab TID for one month and then developed confusion which they though was medication related, so stopped the medication.  I recommended that he restart sinemet 0.5 tablet TID, which he is tolerating well.     Medications:  Current Outpatient Medications on File Prior to Visit  Medication Sig Dispense Refill  . busPIRone (BUSPAR) 15 MG tablet Take 1 tablet (15 mg total) by mouth 2 (two) times daily. 60 tablet 1  . carbidopa-levodopa (SINEMET IR) 25-100 MG tablet Take 1 tablet by mouth 3 (three) times daily. (Patient taking differently: Take 0.5 tablets by mouth 3 (three) times daily. ) 90 tablet 2  . CARTIA XT 240 MG 24 hr capsule TAKE ONE CAPSULE BY MOUTH DAILY 90 capsule 3  . gabapentin (NEURONTIN) 100 MG capsule Take 1 capsule (100 mg total) by mouth 2 (two) times daily. 180 capsule 3  . ketotifen (ZADITOR) 0.025 % ophthalmic solution Place 1 drop into both eyes daily.     Marland Kitchen  Lidocaine HCl 4 % SOLN Apply 4 mLs topically as needed (for migraines).     . Multiple Vitamins-Minerals (DAILY MULTIVITAMIN) CAPS Take 1 capsule by mouth daily.     . nitroGLYCERIN (NITROSTAT) 0.4 MG SL tablet Place 0.4 mg under the tongue every 5 (five) minutes as needed for chest pain.    . polyethylene glycol (MIRALAX / GLYCOLAX) packet Take 17 g by mouth daily. 14 each 0  . pravastatin (PRAVACHOL) 40 MG tablet TAKE ONE TABLET EACH DAY 90 tablet 1  . ranitidine (ZANTAC) 150 MG tablet TAKE ONE TABLET BY MOUTH TWICE A DAY 180 tablet 2  . topiramate (TOPAMAX) 25 MG tablet Take 75 mg by mouth daily.    Marland Kitchen warfarin  (COUMADIN) 2 MG tablet TAKE 1 TABLET TO 1 AND 1/2 TABLETS BY MOUTH DAILY OR AS DIRECTED BY COUMADIN CLINIC 40 tablet 2   No current facility-administered medications on file prior to visit.     Allergies:  Allergies  Allergen Reactions  . Iodinated Diagnostic Agents Hives    Other reaction(s): RASH Other reaction(s): RASH  . Ioxaglate Hives    Other reaction(s): RASH    Review of Systems:  CONSTITUTIONAL: No fevers, chills, night sweats, +weight loss.  EYES: No visual changes or eye pain ENT: No hearing changes.  No history of nose bleeds.   RESPIRATORY: No cough, wheezing and shortness of breath.   CARDIOVASCULAR: Negative for chest pain, and palpitations.   GI: Negative for abdominal discomfort, blood in stools or black stools.  No recent change in bowel habits.   GU:  No history of incontinence.   MUSCLOSKELETAL: No history of joint pain or swelling.  No myalgias.   SKIN: Negative for lesions, rash, and itching.   ENDOCRINE: Negative for cold or heat intolerance, polydipsia or goiter.   PSYCH:  No depression or anxiety symptoms.   NEURO: As Above.   Vital Signs:  BP 110/60   Pulse 70   Ht 6\' 1"  (1.854 m)   Wt 139 lb 2 oz (63.1 kg)   SpO2 98%   BMI 18.36 kg/m    General Medical Exam:   General:  Thin-appearing, comfortable  Eyes/ENT: see cranial nerve examination.   Neck: No masses appreciated.  Full range of motion without tenderness.  No carotid bruits. Respiratory:  Clear to auscultation, good air entry bilaterally.   Cardiac:  Regular rate and rhythm, no murmur.   Ext:  No edema  Neurological Exam: MENTAL STATUS including orientation to time, place, person, recent memory is intact.  He can name the current present.  He does not remember his wife date of birth.  He correctly names his address and phone number.  Insight is intact.  Attention is good.   Speech is not dysarthric.   CRANIAL NERVES:  Face is symmetric. Reduced blink.  Blunted affect  MOTOR:  Motor  strength is 5/5 in all extremities.  Generalized loss of muscle bulk throughout. Tone is normal in the arms (improved)  MSRs:  Reflexes are 2+/4 throughout, except 1+/4 at the ankles bilaterally.  COORDINATION/GAIT: Amplitude of finger is improved bilaterally, slowed toe tapping bilaterally.   Gait mildly unsteady, improved cadence, turns with 2-3 steps, assisted with cane.  Poor arm swing  Data: Vascular studies of the legs 06/01/2016: No evidence of segmental lower extremity arterial disease at rest, bilaterally. ABI's are inaccurate; non compressible arteries due to medial calcifications. Abnormal right great toe pressure. Normal left great toe pressure  IMPRESSION/PLAN: 1.  Parkinsonism,  akinetic rigid type.  Improved on sinemet which will be slowly titrated to 1 tablet at 8am, 1pm, and 6pm.  2.  Bilateral achy leg pain, well-controlled on gabapentin 100mg  daily daily.  3.  Confusion due to probable early dementia.  He does manifest short and long term memory recall deficits.  With his likely diagnosis of parkinson's disease, need to monitor for signs of hallucinations or fluctuating alertness which can be seen with Lewy Body Dementia. Because topiramate may also cause cognitive impairment, I have asked him to follow-up with Dr. Domingo Richard to determine whether the dose of topirimate 75mg  can be reduced especially as headaches are well controlled and weight loss may also be due to this.  Return to clinic in 4 months   Thank you for allowing me to participate in patient's care.  If I can answer any additional questions, I would be pleased to do so.    Sincerely,    Natthew Marlatt K. Posey Pronto, DO

## 2017-10-28 ENCOUNTER — Ambulatory Visit: Payer: Medicare Other | Admitting: Neurology

## 2017-10-28 ENCOUNTER — Encounter: Payer: Self-pay | Admitting: Neurology

## 2017-10-28 VITALS — BP 110/60 | HR 70 | Ht 73.0 in | Wt 139.1 lb

## 2017-10-28 DIAGNOSIS — M79605 Pain in left leg: Secondary | ICD-10-CM

## 2017-10-28 DIAGNOSIS — G3184 Mild cognitive impairment, so stated: Secondary | ICD-10-CM

## 2017-10-28 DIAGNOSIS — G20 Parkinson's disease: Secondary | ICD-10-CM | POA: Diagnosis not present

## 2017-10-28 DIAGNOSIS — M79604 Pain in right leg: Secondary | ICD-10-CM

## 2017-10-28 NOTE — Patient Instructions (Addendum)
Start Carbidopa Levodopa as follows at least 30-min prior to meals:     8 AM  1pm     6pm  Week 1:  1/2 tab  1/2 tab   1 tab  Week 2:   1/2 tab  1 tab   1 tab  Week 3:  1 tab  1 tab   1 tab and continue  Please talk to Dr. Domingo Cocking about reducing topiramate since this can cause memory changes, confusion, and weight loss  Return to clinic in 4 months

## 2017-11-01 ENCOUNTER — Telehealth: Payer: Self-pay | Admitting: Internal Medicine

## 2017-11-01 NOTE — Telephone Encounter (Signed)
Insurance has been submitted and verified for Prolia. Patient is responsible for a $240 copay. Due on or after 02/16/2018. Left message for patient to call back to schedule.  Okay to schedule... Visit Note: Prolia ($240 copay - okay to give per Gareth Eagle) Visit Type: Nurse Provider: Nurse

## 2017-11-13 ENCOUNTER — Ambulatory Visit: Payer: Medicare Other | Admitting: *Deleted

## 2017-11-13 DIAGNOSIS — Z952 Presence of prosthetic heart valve: Secondary | ICD-10-CM | POA: Diagnosis not present

## 2017-11-13 DIAGNOSIS — Z5181 Encounter for therapeutic drug level monitoring: Secondary | ICD-10-CM | POA: Diagnosis not present

## 2017-11-13 DIAGNOSIS — I4891 Unspecified atrial fibrillation: Secondary | ICD-10-CM | POA: Diagnosis not present

## 2017-11-13 LAB — POCT INR: INR: 2.5 (ref 2.0–3.0)

## 2017-11-13 NOTE — Patient Instructions (Signed)
Description   Continue taking 1 tablet daily. Recheck in 6 weeks.  Call us with any medication changes or concerns # [phone] [facility], Main # [phone].      

## 2017-11-22 ENCOUNTER — Ambulatory Visit: Payer: Medicare Other | Admitting: Neurology

## 2017-11-28 NOTE — Progress Notes (Signed)
Subjective:    Patient ID: Robert Richard, male    DOB: May 10, 1930, 84 y.o.   MRN: [id number]  HPI The patient is here for follow up.  Prediabetes:  He is compliant with a low sugar/carbohydrate diet.  He is exercising regularly.  CAD, Afib, Hypertension: He is taking his medication daily. He is compliant with a low sodium diet.  He denies chest pain, palpitations, edema, shortness of breath and regular headaches. He is exercising regularly.  He does not monitor his blood pressure at home.    Hyperlipidemia: He is taking his medication daily. He is compliant with a low fat/cholesterol diet. He is exercising regularly. He denies myalgias.   GERD:  He is taking his medication daily as prescribed.  He denies any GERD symptoms and feels his GERD is well controlled.   Anxiety: He is taking his medication daily as prescribed. He denies any side effects from the medication. He feels his anxiety is well controlled and he is happy with his current dose of medication.   OP:  He is getting prolia injections twice a year.  He is exercising 30 minutes / day.    Medications and allergies reviewed with patient and updated if appropriate.  Patient Active Problem List   Diagnosis Date Noted  . Confusion 10/15/2017  . Anxiety 11/05/2016  . Urinary frequency 09/12/2016  . Cough 09/12/2016  . Prediabetes 05/01/2016  . Pain in both lower extremities 05/01/2016  . Bilateral hearing loss 05/01/2016  . Migraine 04/26/2015  . Aortic valve replaced 04/21/2015  . Subacute confusional state 12/14/2014  . Melanoma of neck ([place]) 07/05/2014  . Pulmonary infiltrates 07/31/2013  . Encounter for therapeutic drug monitoring 03/20/2013  . CAD (coronary artery disease) 03/13/2013  . Atrial fibrillation, controlled ([place]) 03/13/2013  . Protein-calorie malnutrition, severe ([place]) 02/27/2013  . S/P CABG x 3 02/20/2013  . History of embolectomy 09/22/2012  . CKD (chronic kidney disease) 09/22/2012  . Syncope  09/15/2012  . Melanoma ([place]) 08/26/2012  . Peripheral vascular disease, unspecified ([place]) 07/08/2012  . Mitral stenosis 07/27/2010  . Hyperlipidemia 10/25/2009  . Essential hypertension 10/25/2009  . NEPHROLITHIASIS, HX OF 10/25/2009  . SLEEP DISORDER 11/29/2008  . SNORING 10/14/2008  . APNEA 10/05/2008  . SKIN CANCER, HX OF 10/05/2008  . RESTLESS LEG SYNDROME 09/11/2007  . Allergic rhinitis, cause unspecified 09/11/2007  . GOUT 03/15/2006  . ASTHMA 03/15/2006  . COPD 03/15/2006  . Osteoporosis 03/15/2006  . COLONIC POLYPS, HX OF 03/15/2006    Current Outpatient Medications on File Prior to Visit  Medication Sig Dispense Refill  . busPIRone (BUSPAR) 15 MG tablet Take 1 tablet (15 mg total) by mouth 2 (two) times daily. 60 tablet 1  . carbidopa-levodopa (SINEMET IR) 25-100 MG tablet Take 1 tablet by mouth 3 (three) times daily. (Patient taking differently: Take 0.5 tablets by mouth 3 (three) times daily. ) 90 tablet 2  . CARTIA XT 240 MG 24 hr capsule TAKE ONE CAPSULE BY MOUTH DAILY 90 capsule 3  . gabapentin (NEURONTIN) 100 MG capsule Take 1 capsule (100 mg total) by mouth 2 (two) times daily. 180 capsule 3  . ketotifen (ZADITOR) 0.025 % ophthalmic solution Place 1 drop into both eyes daily.     . Lidocaine HCl 4 % SOLN Apply 4 mLs topically as needed (for migraines).     . Multiple Vitamins-Minerals (DAILY MULTIVITAMIN) CAPS Take 1 capsule by mouth daily.     . nitroGLYCERIN (NITROSTAT) 0.4 MG SL tablet Place  0.4 mg under the tongue every 5 (five) minutes as needed for chest pain.    . polyethylene glycol (MIRALAX / GLYCOLAX) packet Take 17 g by mouth daily. 14 each 0  . pravastatin (PRAVACHOL) 40 MG tablet TAKE ONE TABLET EACH DAY 90 tablet 1  . ranitidine (ZANTAC) 150 MG tablet TAKE ONE TABLET BY MOUTH TWICE A DAY 180 tablet 2  . topiramate (TOPAMAX) 25 MG tablet Take 75 mg by mouth daily.    Marland Kitchen warfarin (COUMADIN) 2 MG tablet TAKE 1 TABLET TO 1 AND 1/2 TABLETS BY MOUTH DAILY OR  AS DIRECTED BY COUMADIN CLINIC 40 tablet 2   No current facility-administered medications on file prior to visit.     Past Medical History:  Diagnosis Date  . Allergic rhinitis   . Anemia   . Arthritis    SHOULDER  . Asthma with bronchitis   . Colon polyp   . COPD (chronic obstructive pulmonary disease) (H[facility])    Astmatic bronchitis  . Dysrhythmia 08/2012   NEW ONSET ATRIAL FIBRILATION  . Gout    PMH  . Headache(784.0)   . HLD (hyperlipidemia)   . Nephrolithiasis   . Osteoporosis   . RLS (restless legs syndrome)   . Skin cancer (melanoma) ([place]) 2012   Right neck  . Skin cancer, basal cell    Dr Nevada Crane, [facility]    Past Surgical History:  Procedure Laterality Date  . AORTIC VALVE REPLACEMENT N/A 02/20/2013   Procedure: AORTIC VALVE REPLACEMENT (AVR);  Surgeon: Ivin Poot, MD;  Location: [place];  Service: Open Heart Surgery;  Laterality: N/A;  . COLONOSCOPY W/ POLYPECTOMY     Dr Deatra Ina  . CORONARY ARTERY BYPASS GRAFT N/A 02/20/2013   Procedure: CORONARY ARTERY BYPASS GRAFTING (CABG);  Surgeon: Ivin Poot, MD;  Location: [place];  Service: Open Heart Surgery;  Laterality: N/A;  . FINGER SURGERY     for foreign body  . INGUINAL HERNIA REPAIR    . INTRAOPERATIVE TRANSESOPHAGEAL ECHOCARDIOGRAM N/A 02/20/2013   Procedure: INTRAOPERATIVE TRANSESOPHAGEAL ECHOCARDIOGRAM;  Surgeon: Ivin Poot, MD;  Location: [place];  Service: Open Heart Surgery;  Laterality: N/A;  . LEFT AND RIGHT HEART CATHETERIZATION WITH CORONARY ANGIOGRAM N/A 01/05/2013   Procedure: LEFT AND RIGHT HEART CATHETERIZATION WITH CORONARY ANGIOGRAM;  Surgeon: Blane Ohara, MD;  Location: [facility] CATH LAB;  Service: Cardiovascular;  Laterality: N/A;  . LEG SURGERY  06/2010    Dr.Lawson, for blood clott. Left leg   . NECK SURGERY  12/2010   Melanoma ; [facility]   . NOSE SURGERY     Septal Deviation  . RIGHT HEART CATHETERIZATION N/A 01/23/2013   Procedure: RIGHT HEART CATH;  Surgeon: Jolaine Artist, MD;   Location: [facility] CATH LAB;  Service: Cardiovascular;  Laterality: N/A;  . TEE WITHOUT CARDIOVERSION N/A 01/23/2013   Procedure: TRANSESOPHAGEAL ECHOCARDIOGRAM (TEE);  Surgeon: Jolaine Artist, MD;  Location: [facility] ENDOSCOPY;  Service: Cardiovascular;  Laterality: N/A;  sign heart cath consent w/tee consent    Social History   Socioeconomic History  . Marital status: Married    Spouse name: Not on file  . Number of children: Not on file  . Years of education: Not on file  . Highest education level: Not on file  Occupational History  . Occupation: retired    Fish farm manager: [employer]  . Financial resource strain: Not on file  . Food insecurity:    Worry: Not on file  Inability: Not on file  . Transportation needs:    Medical: Not on file    Non-medical: Not on file  Tobacco Use  . Smoking status: Former Smoker    Packs/day: 3.00    Years: 15.00    Pack years: 45.00    Types: Cigarettes    Last attempt to quit: 02/19/1961    Years since quitting: 56.8  . Smokeless tobacco: Never Used  Substance and Sexual Activity  . Alcohol use: No  . Drug use: No  . Sexual activity: Not on file  Lifestyle  . Physical activity:    Days per week: Not on file    Minutes per session: Not on file  . Stress: Not on file  Relationships  . Social connections:    Talks on phone: Not on file    Gets together: Not on file    Attends religious service: Not on file    Active member of club or organization: Not on file    Attends meetings of clubs or organizations: Not on file    Relationship status: Not on file  Other Topics Concern  . Not on file  Social History Narrative  . Not on file    Family History  Problem Relation Age of Onset  . Allergies Mother   . Coronary artery disease Mother   . Allergy (severe) Mother   . Allergies Father   . Stroke Father   . Heart attack Father 72  . Asthma Father   . Allergy (severe) Father   . Allergies Sister   . Allergy (severe)  Sister   . Allergies Brother        x2  . Allergy (severe) Brother   . Diabetes Brother   . Allergy (severe) Brother   . Emphysema Brother        smoked  . Cancer Son     Review of Systems  Constitutional: Negative for appetite change, chills, fatigue and fever.  Respiratory: Negative for cough, shortness of breath and wheezing.   Cardiovascular: Negative for chest pain, palpitations and leg swelling.  Gastrointestinal: Negative for abdominal pain and nausea.  Neurological: Negative for dizziness, light-headedness and headaches.  Psychiatric/Behavioral: Negative for dysphoric mood. The patient is not nervous/anxious.        Objective:   Vitals:   11/29/17 1335  BP: 140/70  Pulse: 73  Resp: 16  Temp: 97.6 F (36.4 C)  SpO2: 97%   BP Readings from Last 3 Encounters:  11/29/17 140/70  10/28/17 110/60  10/15/17 136/78   Wt Readings from Last 3 Encounters:  11/29/17 142 lb (64.4 kg)  10/28/17 139 lb 2 oz (63.1 kg)  10/15/17 141 lb (64 kg)   Body mass index is 18.73 kg/m.   Physical Exam    Constitutional: Appears well-developed and well-nourished. No distress.  HENT:  Head: Normocephalic and atraumatic.  Neck: Neck supple. No tracheal deviation present. No thyromegaly present.  No cervical lymphadenopathy Cardiovascular: Normal rate, regular rhythm and normal heart sounds.   2/6 systolic murmur heard. No carotid bruit .  No edema Pulmonary/Chest: Effort normal and breath sounds normal. No respiratory distress. No has no wheezes. No rales.  Skin: Skin is warm and dry. Not diaphoretic.  Psychiatric: Normal mood and affect. Behavior is normal.      Assessment & Plan:    See Problem List for Assessment and Plan of chronic medical problems.

## 2017-11-29 ENCOUNTER — Encounter: Payer: Self-pay | Admitting: Internal Medicine

## 2017-11-29 ENCOUNTER — Ambulatory Visit: Payer: Medicare Other | Admitting: Internal Medicine

## 2017-11-29 VITALS — BP 140/70 | HR 73 | Temp 97.6°F | Resp 16 | Ht 73.0 in | Wt 142.0 lb

## 2017-11-29 DIAGNOSIS — I4891 Unspecified atrial fibrillation: Secondary | ICD-10-CM

## 2017-11-29 DIAGNOSIS — F419 Anxiety disorder, unspecified: Secondary | ICD-10-CM

## 2017-11-29 DIAGNOSIS — M810 Age-related osteoporosis without current pathological fracture: Secondary | ICD-10-CM

## 2017-11-29 DIAGNOSIS — Z23 Encounter for immunization: Secondary | ICD-10-CM | POA: Diagnosis not present

## 2017-11-29 DIAGNOSIS — I10 Essential (primary) hypertension: Secondary | ICD-10-CM | POA: Diagnosis not present

## 2017-11-29 DIAGNOSIS — E7849 Other hyperlipidemia: Secondary | ICD-10-CM

## 2017-11-29 DIAGNOSIS — K219 Gastro-esophageal reflux disease without esophagitis: Secondary | ICD-10-CM

## 2017-11-29 DIAGNOSIS — N189 Chronic kidney disease, unspecified: Secondary | ICD-10-CM | POA: Diagnosis not present

## 2017-11-29 DIAGNOSIS — R7303 Prediabetes: Secondary | ICD-10-CM

## 2017-11-29 NOTE — Assessment & Plan Note (Signed)
Rate controlled, asymptomatic On warfarin, cartia Following with cardiology

## 2017-11-29 NOTE — Assessment & Plan Note (Signed)
Has been stable so will hold off on blood work

## 2017-11-29 NOTE — Assessment & Plan Note (Signed)
Controlled, stable Continue current dose of medication buspar 15 mg BID

## 2017-11-29 NOTE — Assessment & Plan Note (Signed)
prolia Q 6 months Check vitamin d level with next blood work Taking MVI Exercising - rides bike

## 2017-11-29 NOTE — Assessment & Plan Note (Signed)
BP well controlled Current regimen effective and well tolerated Continue current medications at current doses  

## 2017-11-29 NOTE — Assessment & Plan Note (Signed)
Continue statin. 

## 2017-11-29 NOTE — Assessment & Plan Note (Signed)
Controlled Discussed checking with pharmacist regarding zantac he is taking - can change to otc pepcid

## 2017-11-29 NOTE — Assessment & Plan Note (Signed)
Lab Results  Component Value Date   HGBA1C 6.1 10/15/2017    Recheck in 6 months

## 2017-12-02 ENCOUNTER — Other Ambulatory Visit: Payer: Self-pay | Admitting: Internal Medicine

## 2017-12-14 ENCOUNTER — Other Ambulatory Visit: Payer: Self-pay | Admitting: Internal Medicine

## 2017-12-23 ENCOUNTER — Telehealth: Payer: Self-pay

## 2017-12-23 NOTE — Telephone Encounter (Signed)
Copied from [place] [phone]. Topic: Appointment Scheduling - Scheduling Inquiry for Clinic >> Dec 23, 2017 10:33 AM Ahmed Prima L wrote: Reason for CRM: Patient's wife is calling to schedule his prolia injection for December. Please call patient, [phone]  appt has been made for dec 30th at 3:00, estimated $240 copay

## 2017-12-25 ENCOUNTER — Ambulatory Visit: Payer: Medicare Other | Admitting: *Deleted

## 2017-12-25 DIAGNOSIS — Z952 Presence of prosthetic heart valve: Secondary | ICD-10-CM

## 2017-12-25 DIAGNOSIS — Z5181 Encounter for therapeutic drug level monitoring: Secondary | ICD-10-CM | POA: Diagnosis not present

## 2017-12-25 DIAGNOSIS — I4891 Unspecified atrial fibrillation: Secondary | ICD-10-CM | POA: Diagnosis not present

## 2017-12-25 LAB — POCT INR: INR: 2.8 (ref 2.0–3.0)

## 2017-12-25 NOTE — Patient Instructions (Signed)
Description   Continue taking 1 tablet daily. Recheck in 6 weeks.  Call us with any medication changes or concerns # [phone] [facility], Main # [phone].      

## 2018-01-02 DIAGNOSIS — R51 Headache: Secondary | ICD-10-CM | POA: Diagnosis not present

## 2018-01-02 DIAGNOSIS — G43719 Chronic migraine without aura, intractable, without status migrainosus: Secondary | ICD-10-CM | POA: Diagnosis not present

## 2018-01-03 DIAGNOSIS — R3121 Asymptomatic microscopic hematuria: Secondary | ICD-10-CM | POA: Diagnosis not present

## 2018-01-03 DIAGNOSIS — R3915 Urgency of urination: Secondary | ICD-10-CM | POA: Diagnosis not present

## 2018-01-13 ENCOUNTER — Other Ambulatory Visit: Payer: Self-pay | Admitting: Internal Medicine

## 2018-02-03 ENCOUNTER — Telehealth: Payer: Self-pay | Admitting: Internal Medicine

## 2018-02-03 MED ORDER — FAMOTIDINE 20 MG PO TABS: 20.0000 mg | ORAL_TABLET | Freq: Two times a day (BID) | ORAL | 1 refills | Status: DC

## 2018-02-03 NOTE — Telephone Encounter (Signed)
pepcid sent

## 2018-02-03 NOTE — Telephone Encounter (Signed)
Copied from [place] [phone]. Topic: Quick Communication - Rx Refill/Question >> Feb 03, 2018 10:13 AM Margot Ables wrote: Medication: ranitidine (ZANTAC) 150 MG tablet - on backorder - please send RX to change medication  Has the patient contacted their pharmacy? Yes - pharmacy calling Preferred Pharmacy (with phone number or street name): [place], [place] [phone] (Phone) [phone]

## 2018-02-05 ENCOUNTER — Ambulatory Visit: Payer: Medicare Other | Admitting: *Deleted

## 2018-02-05 DIAGNOSIS — Z952 Presence of prosthetic heart valve: Secondary | ICD-10-CM | POA: Diagnosis not present

## 2018-02-05 DIAGNOSIS — Z5181 Encounter for therapeutic drug level monitoring: Secondary | ICD-10-CM

## 2018-02-05 DIAGNOSIS — I4891 Unspecified atrial fibrillation: Secondary | ICD-10-CM | POA: Diagnosis not present

## 2018-02-05 LAB — POCT INR: INR: 2.2 (ref 2.0–3.0)

## 2018-02-05 NOTE — Patient Instructions (Signed)
Description   Continue taking 1 tablet daily. Recheck in 6 weeks.  Call us with any medication changes or concerns # [phone] Coumadin Clinic, Main # [phone].

## 2018-02-14 ENCOUNTER — Other Ambulatory Visit: Payer: Self-pay | Admitting: Internal Medicine

## 2018-02-17 ENCOUNTER — Ambulatory Visit: Payer: Medicare Other

## 2018-02-17 DIAGNOSIS — M810 Age-related osteoporosis without current pathological fracture: Secondary | ICD-10-CM | POA: Diagnosis not present

## 2018-02-17 MED ORDER — DENOSUMAB 60 MG/ML ~~LOC~~ SOSY
60.0000 mg | PREFILLED_SYRINGE | Freq: Once | SUBCUTANEOUS | Status: AC
  Administered 2018-02-17: 60 mg via SUBCUTANEOUS

## 2018-02-17 NOTE — Progress Notes (Signed)
I have reviewed and agree.

## 2018-03-06 NOTE — Progress Notes (Deleted)
Follow-up Visit   Date: 03/06/18    Robert Richard MRN: [id number] DOB: 01/20/1931   Interim History: Robert Richard is a 85 y.o. right-handed Caucasian male with CAD s/p CABG, PAF, PVD, HPL, HTN, RLS, anemia, asthma, COPD, history of cerebellar stroke (2015), and CKD returning to the clinic for follow-up parkinsonism and bilateral leg pain.  The patient was accompanied to the clinic by wife who also provides collateral information.    History of present illness: For the past 5-10 years, he had had dull achy pain of the lower legs from the knee down.  He denies numbness, burning, stabbing, or tingling. Pain is present at rest and with activity and there is nothing that makes it better.  Sometimes, walking can help but not always. It is worse during the daytime.  He does not have discomfort such as crawling sensation of the legs. There is no muscle tenderness.  He has arterial studies of the legs which were normal.  His has some imbalance and has been walking with a cane for the past 3 years.  He has not suffered any recent falls.  He also has generalized weakness of the arm and legs.  He was much more active prior to his CABG, but since this time, he has become sedentary.  UPDATE 11/19/2016:   He is very pleased with the relief he gets with gabapentin, because since starting gabapentin 100mg  twice daily, his leg pain has completely resolved.  He has lost 14lb in the past several months and despite him trying to eat more calories, his weight continues to decline.  Review of his medication shows topiramate 75mg  daily which he takes for migraines for the past 2-3 years written by Dr. Domingo Cocking.  His migraines are extremely well-controlled.  UPDATE 10/27/2017:  He is here for sooner than scheduled visit due to questions regarding medication dose.  At his last visit, I started him on sinemet due to concern of parkinsonism and wife noticed improved gait, energy, and movements were better.  He took sinemet  1 tab TID for one month and then developed confusion which they though was medication related, so stopped the medication.  I recommended that he restart sinemet 0.5 tablet TID, which he is tolerating well.     UPDATE 03/06/2018:  ***He is here for follow-up visit.  He is taking Sinemet 1 tablet 3 times daily as prescribed and noticed ***  Medications:  Current Outpatient Medications on File Prior to Visit  Medication Sig Dispense Refill  . busPIRone (BUSPAR) 15 MG tablet TAKE ONE TABLET (15MG  TOTAL) BY MOUTH TWO TIMES DAILY 60 tablet 3  . carbidopa-levodopa (SINEMET IR) 25-100 MG tablet Take 1 tablet by mouth 3 (three) times daily. (Patient taking differently: Take 0.5 tablets by mouth 3 (three) times daily. ) 90 tablet 2  . CARTIA XT 240 MG 24 hr capsule TAKE ONE CAPSULE BY MOUTH DAILY 90 capsule 3  . famotidine (PEPCID) 20 MG tablet Take 1 tablet (20 mg total) by mouth 2 (two) times daily. 180 tablet 1  . gabapentin (NEURONTIN) 100 MG capsule Take 1 capsule (100 mg total) by mouth 2 (two) times daily. 180 capsule 3  . ketotifen (ZADITOR) 0.025 % ophthalmic solution Place 1 drop into both eyes daily.     . Lidocaine HCl 4 % SOLN Apply 4 mLs topically as needed (for migraines).     . Multiple Vitamins-Minerals (DAILY MULTIVITAMIN) CAPS Take 1 capsule by mouth daily.     Marland Kitchen  nitroGLYCERIN (NITROSTAT) 0.4 MG SL tablet Place 0.4 mg under the tongue every 5 (five) minutes as needed for chest pain.    . polyethylene glycol (MIRALAX / GLYCOLAX) packet Take 17 g by mouth daily. 14 each 0  . pravastatin (PRAVACHOL) 40 MG tablet TAKE ONE (1) TABLET BY MOUTH EVERY DAY 90 tablet 1  . topiramate (TOPAMAX) 25 MG tablet Take 75 mg by mouth daily.    Marland Kitchen warfarin (COUMADIN) 2 MG tablet TAKE 1 TO 1 AND 1/2 TABLETS BY MOUTH DAILY OR AS DIRECTED BY COUMADINCLINIC 40 tablet 3   No current facility-administered medications on file prior to visit.     Allergies:  Allergies  Allergen Reactions  . Iodinated  Diagnostic Agents Hives    Other reaction(s): RASH Other reaction(s): RASH  . Ioxaglate Hives    Other reaction(s): RASH    Review of Systems:  CONSTITUTIONAL: No fevers, chills, night sweats, +weight loss.  EYES: No visual changes or eye pain ENT: No hearing changes.  No history of nose bleeds.   RESPIRATORY: No cough, wheezing and shortness of breath.   CARDIOVASCULAR: Negative for chest pain, and palpitations.   GI: Negative for abdominal discomfort, blood in stools or black stools.  No recent change in bowel habits.   GU:  No history of incontinence.   MUSCLOSKELETAL: No history of joint pain or swelling.  No myalgias.   SKIN: Negative for lesions, rash, and itching.   ENDOCRINE: Negative for cold or heat intolerance, polydipsia or goiter.   PSYCH:  No depression or anxiety symptoms.   NEURO: As Above.   Vital Signs:  There were no vitals taken for this visit.   General Medical Exam:   General:  Thin-appearing, comfortable  Eyes/ENT: see cranial nerve examination.   Neck: No masses appreciated.  Full range of motion without tenderness.  No carotid bruits. Respiratory:  Clear to auscultation, good air entry bilaterally.   Cardiac:  Regular rate and rhythm, no murmur.   Ext:  No edema  Neurological Exam: MENTAL STATUS including orientation to time, place, person, recent memory is intact.  He can name the current present.  He does not remember his wife date of birth.  He correctly names his address and phone number.  Insight is intact.  Attention is good.   Speech is not dysarthric.   CRANIAL NERVES:  Face is symmetric. Reduced blink.  Blunted affect  MOTOR:  Motor strength is 5/5 in all extremities.  Generalized loss of muscle bulk throughout. Tone is normal in the arms (improved)  MSRs:  Reflexes are 2+/4 throughout, except 1+/4 at the ankles bilaterally.  COORDINATION/GAIT: Amplitude of finger is improved bilaterally, slowed toe tapping bilaterally.   Gait mildly  unsteady, improved cadence, turns with 2-3 steps, assisted with cane.  Poor arm swing  Data: Vascular studies of the legs 06/01/2016: No evidence of segmental lower extremity arterial disease at rest, bilaterally. ABI's are inaccurate; non compressible arteries due to medial calcifications. Abnormal right great toe pressure. Normal left great toe pressure  IMPRESSION/PLAN: 1.  Parkinsonism, akinetic rigid type.  Continue Sinemet 1 tablet TID at 8am, 1pm, and 6pm.  2.  Bilateral achy leg pain, well-controlled on gabapentin 100mg  daily daily.  3.  Confusion due to probable early dementia.  He does manifest short and long term memory recall deficits.  With his likely diagnosis of parkinson's disease, need to monitor for signs of hallucinations or fluctuating alertness which can be seen with Lewy Body Dementia. Because topiramate  may also cause cognitive impairment, I have asked him to follow-up with Dr. Domingo Cocking to determine whether the dose of topirimate 75mg  can be reduced especially as headaches are well controlled and weight loss may also be due to this.  Return to clinic in 4 months   Thank you for allowing me to participate in patient's care.  If I can answer any additional questions, I would be pleased to do so.    Sincerely,    Maximino Cozzolino K. Posey Pronto, DO

## 2018-03-07 ENCOUNTER — Ambulatory Visit: Payer: Medicare Other | Admitting: Neurology

## 2018-03-17 ENCOUNTER — Ambulatory Visit: Payer: Medicare Other

## 2018-03-17 DIAGNOSIS — I4891 Unspecified atrial fibrillation: Secondary | ICD-10-CM

## 2018-03-17 DIAGNOSIS — Z5181 Encounter for therapeutic drug level monitoring: Secondary | ICD-10-CM | POA: Diagnosis not present

## 2018-03-17 DIAGNOSIS — Z952 Presence of prosthetic heart valve: Secondary | ICD-10-CM

## 2018-03-17 LAB — POCT INR: INR: 2.3 (ref 2.0–3.0)

## 2018-03-17 NOTE — Patient Instructions (Signed)
Description   Continue taking 1 tablet daily. Recheck in 6 weeks.  Call us with any medication changes or concerns # [phone] [facility] # [phone].

## 2018-04-24 DIAGNOSIS — D485 Neoplasm of uncertain behavior of skin: Secondary | ICD-10-CM | POA: Diagnosis not present

## 2018-04-24 DIAGNOSIS — L820 Inflamed seborrheic keratosis: Secondary | ICD-10-CM | POA: Diagnosis not present

## 2018-04-24 DIAGNOSIS — L570 Actinic keratosis: Secondary | ICD-10-CM | POA: Diagnosis not present

## 2018-04-24 DIAGNOSIS — C44319 Basal cell carcinoma of skin of other parts of face: Secondary | ICD-10-CM | POA: Diagnosis not present

## 2018-04-28 ENCOUNTER — Ambulatory Visit: Payer: Medicare Other

## 2018-04-28 DIAGNOSIS — I4891 Unspecified atrial fibrillation: Secondary | ICD-10-CM

## 2018-04-28 DIAGNOSIS — Z5181 Encounter for therapeutic drug level monitoring: Secondary | ICD-10-CM | POA: Diagnosis not present

## 2018-04-28 LAB — POCT INR: INR: 2.3 (ref 2.0–3.0)

## 2018-04-28 NOTE — Patient Instructions (Signed)
Description   Continue taking 1 tablet daily. Recheck in 6 weeks.  Call us with any medication changes or concerns # [phone] [facility] # [phone].

## 2018-05-13 ENCOUNTER — Other Ambulatory Visit: Payer: Self-pay | Admitting: Internal Medicine

## 2018-05-20 DIAGNOSIS — C44319 Basal cell carcinoma of skin of other parts of face: Secondary | ICD-10-CM | POA: Diagnosis not present

## 2018-06-01 NOTE — Progress Notes (Signed)
Virtual Visit via Video Note  I connected with Robert Richard on 06/03/18 at  2:00 PM EDT by a video enabled telemedicine application and verified that I am speaking with the correct person using two identifiers.   I discussed the limitations of evaluation and management by telemedicine and the availability of in person appointments. The patient expressed understanding and agreed to proceed.  The patient is currently at home and I am in the office.  His wife is with him and she provides some of the history due to his poor memory.  No referring provider.    History of Present Illness: He is here for follow up of his chronic medical conditions.    He is exercising regularly.    Prediabetes:  He is compliant with a low sugar/carbohydrate diet.  He is exercising regularly.  CAD, AFib, Hypertension: He is taking his medication daily. He is compliant with a low sodium diet.  He denies chest pain, palpitations, edema, shortness of breath and regular headaches.  He does not monitor his blood pressure at home-his wife tried to check it this morning, but she was not sure how to use the cough.    Hyperlipidemia: He is taking his medication daily. He is compliant with a low fat/cholesterol diet. He denies myalgias.   GERD:  He is taking his medication daily as prescribed.  He denies any GERD symptoms and feels his GERD is well controlled.   Anxiety: He is taking his medication daily as prescribed. He denies any side effects from the medication. He feels his anxiety is well controlled and he is happy with his current dose of medication.   Osteoporosis:  He is getting prolia injections twice a year.  He is exercising.  He takes a MVI daily.  Him and his wife did not have any concerns or questions.  She states he did not feel well for couple of weeks-nothing specific he just did not feel well.  The last couple of days he has felt fine.  She thinks it may have been related to the pollen.   Social  History   Socioeconomic History  . Marital status: Married    Spouse name: Not on file  . Number of children: Not on file  . Years of education: Not on file  . Highest education level: Not on file  Occupational History  . Occupation: retired    Fish farm manager: [employer]  . Financial resource strain: Not on file  . Food insecurity:    Worry: Not on file    Inability: Not on file  . Transportation needs:    Medical: Not on file    Non-medical: Not on file  Tobacco Use  . Smoking status: Former Smoker    Packs/day: 3.00    Years: 15.00    Pack years: 45.00    Types: Cigarettes    Last attempt to quit: 02/19/1961    Years since quitting: 57.3  . Smokeless tobacco: Never Used  Substance and Sexual Activity  . Alcohol use: No  . Drug use: No  . Sexual activity: Not on file  Lifestyle  . Physical activity:    Days per week: Not on file    Minutes per session: Not on file  . Stress: Not on file  Relationships  . Social connections:    Talks on phone: Not on file    Gets together: Not on file    Attends religious service: Not on file  Active member of club or organization: Not on file    Attends meetings of clubs or organizations: Not on file    Relationship status: Not on file  Other Topics Concern  . Not on file  Social History Narrative  . Not on file     Observations/Objective: Appears well in NAD Breathing normally Normal mood and affect  BP Readings from Last 3 Encounters:  11/29/17 140/70  10/28/17 110/60  10/15/17 136/78     Lab Results  Component Value Date   WBC 8.3 10/15/2017   HGB 11.2 (L) 10/15/2017   HCT 33.9 (L) 10/15/2017   PLT 175.0 10/15/2017   GLUCOSE 125 (H) 10/15/2017   CHOL 103 10/15/2017   TRIG 126.0 10/15/2017   HDL 31.40 (L) 10/15/2017   LDLCALC 46 10/15/2017   ALT 6 10/15/2017   AST 15 10/15/2017   NA 140 10/15/2017   K 4.2 10/15/2017   CL 112 10/15/2017   CREATININE 1.45 10/15/2017   BUN 25 (H)  10/15/2017   CO2 23 10/15/2017   TSH 1.67 10/15/2017   INR 2.3 04/28/2018   HGBA1C 6.1 10/15/2017    Assessment and Plan:  See Problem List for Assessment and Plan of chronic medical problems.   Follow Up Instructions:  He will follow-up in 6 months-October 2020 I did advise that he get blood work sometime over the summer once the coronavirus situation has resolved   I discussed the assessment and treatment plan with the patient. The patient was provided an opportunity to ask questions and all were answered. The patient agreed with the plan and demonstrated an understanding of the instructions.   The patient was advised to call back or seek an in-person evaluation if the symptoms worsen or if the condition fails to improve as anticipated.    [name], MD

## 2018-06-03 ENCOUNTER — Encounter: Payer: Self-pay | Admitting: Internal Medicine

## 2018-06-03 ENCOUNTER — Ambulatory Visit (INDEPENDENT_AMBULATORY_CARE_PROVIDER_SITE_OTHER): Payer: Medicare Other | Admitting: Internal Medicine

## 2018-06-03 DIAGNOSIS — F419 Anxiety disorder, unspecified: Secondary | ICD-10-CM

## 2018-06-03 DIAGNOSIS — I10 Essential (primary) hypertension: Secondary | ICD-10-CM

## 2018-06-03 DIAGNOSIS — E7849 Other hyperlipidemia: Secondary | ICD-10-CM

## 2018-06-03 DIAGNOSIS — I2510 Atherosclerotic heart disease of native coronary artery without angina pectoris: Secondary | ICD-10-CM | POA: Diagnosis not present

## 2018-06-03 DIAGNOSIS — R7303 Prediabetes: Secondary | ICD-10-CM

## 2018-06-03 DIAGNOSIS — N189 Chronic kidney disease, unspecified: Secondary | ICD-10-CM

## 2018-06-03 DIAGNOSIS — I4891 Unspecified atrial fibrillation: Secondary | ICD-10-CM | POA: Diagnosis not present

## 2018-06-03 DIAGNOSIS — K219 Gastro-esophageal reflux disease without esophagitis: Secondary | ICD-10-CM | POA: Diagnosis not present

## 2018-06-03 DIAGNOSIS — M810 Age-related osteoporosis without current pathological fracture: Secondary | ICD-10-CM

## 2018-06-03 NOTE — Assessment & Plan Note (Signed)
Kidney function has been stable-slight improvement 6 months ago Recommended blood work over the summer after coronavirus situation is resolved, but will not get any at this time Continue current medications

## 2018-06-03 NOTE — Assessment & Plan Note (Signed)
Prolia every 6 months Taking a multivitamin He is doing some exercise-rides a bike and he will continue this

## 2018-06-03 NOTE — Assessment & Plan Note (Signed)
Anxiety is well controlled Continue BuSpar at current dose

## 2018-06-03 NOTE — Assessment & Plan Note (Signed)
GERD well controlled Continue Pepcid daily

## 2018-06-03 NOTE — Assessment & Plan Note (Signed)
Lab Results  Component Value Date   HGBA1C 6.1 10/15/2017    Sugars have been very stable Will recheck A1c in 6 months

## 2018-06-03 NOTE — Assessment & Plan Note (Signed)
Continue pravastatin at current dose

## 2018-06-03 NOTE — Assessment & Plan Note (Signed)
No chest pain, palpitations or shortness of breath Continue current medications Following with cardiology

## 2018-06-03 NOTE — Assessment & Plan Note (Signed)
He does not check his blood pressure at home, but his blood pressure has always been very well controlled here Continue current medication

## 2018-06-03 NOTE — Assessment & Plan Note (Signed)
Asymptomatic-no palpitations, chest pain or shortness of breath Following with cardiology Taking warfarin and diltiazem Continue

## 2018-06-04 ENCOUNTER — Other Ambulatory Visit: Payer: Self-pay | Admitting: Internal Medicine

## 2018-06-04 ENCOUNTER — Telehealth: Payer: Self-pay | Admitting: Internal Medicine

## 2018-06-04 ENCOUNTER — Other Ambulatory Visit: Payer: Self-pay | Admitting: *Deleted

## 2018-06-04 NOTE — Telephone Encounter (Signed)
No detailed info received but visit info only states med refilll for warfarin; Please refer to Warfarin refill that was sent today.

## 2018-06-04 NOTE — Telephone Encounter (Signed)
[facility] called and stated that Pepcid is on back order and would like to know what medication the patient can be switched to? Please advise

## 2018-06-05 NOTE — Telephone Encounter (Signed)
Talk to his wife - there are a couple of options  They may be able to buy this otc for a short time period until they have it in stock but I do not know how long that will be.  Or we can change him temporarily to omeprazole 20 mg daily but this is not good for the kidneys - it may be ok short term but we would want to switch him back to pepcid once it is available.    Let me know

## 2018-06-05 NOTE — Telephone Encounter (Signed)
Spoke with wife and she advised that she will buy the rx OTC until it comes back in stock.

## 2018-06-06 ENCOUNTER — Telehealth: Payer: Self-pay

## 2018-06-06 NOTE — Telephone Encounter (Signed)

## 2018-06-09 ENCOUNTER — Ambulatory Visit (INDEPENDENT_AMBULATORY_CARE_PROVIDER_SITE_OTHER): Payer: Medicare Other | Admitting: *Deleted

## 2018-06-09 ENCOUNTER — Other Ambulatory Visit: Payer: Self-pay

## 2018-06-09 DIAGNOSIS — Z5181 Encounter for therapeutic drug level monitoring: Secondary | ICD-10-CM

## 2018-06-09 DIAGNOSIS — I4891 Unspecified atrial fibrillation: Secondary | ICD-10-CM

## 2018-06-09 DIAGNOSIS — Z952 Presence of prosthetic heart valve: Secondary | ICD-10-CM | POA: Diagnosis not present

## 2018-06-09 LAB — POCT INR: INR: 3.3 — AB (ref 2.0–3.0)

## 2018-06-09 NOTE — Patient Instructions (Signed)
Description   Spoke with pt/wife and instructed pt to hold tomorrow's dose then continue taking 1 tablet daily. Recheck in 4 weeks.  Call us with any medication changes or concerns # [phone] [facility] # [phone].

## 2018-06-26 ENCOUNTER — Telehealth: Payer: Self-pay | Admitting: Emergency Medicine

## 2018-06-26 NOTE — Telephone Encounter (Signed)
Copied from [place] [phone]. Topic: General - Other >> Jun 26, 2018 10:53 AM Ivar Drape wrote: Reason for CRM:   Arbie Cookey, the patient's spouse wanted to talk to [facility] about the patient's condition.  He's experiencing headaches and dizziness and not feeling good.  I tried to get the spouse to talk to a triage nurse, but she refused.  She wants to talk to [place] about it.

## 2018-06-26 NOTE — Telephone Encounter (Addendum)
Spoke with pt's wife and states sxs Started yesterday PM, slight headache, dizzy, stomach ache. Temp was normal. BP cuff is not working at home. Will try to order one or pick up one. Pt states he is feeling better today. Advised to keep a watch on pt's sx and give [place] a call if she feels like he needs to be seen.

## 2018-07-04 ENCOUNTER — Telehealth: Payer: Self-pay

## 2018-07-04 NOTE — Telephone Encounter (Signed)

## 2018-07-07 ENCOUNTER — Ambulatory Visit (INDEPENDENT_AMBULATORY_CARE_PROVIDER_SITE_OTHER): Payer: Medicare Other | Admitting: Pharmacist Clinician (PhC)/ Clinical Pharmacy Specialist

## 2018-07-07 ENCOUNTER — Other Ambulatory Visit: Payer: Self-pay

## 2018-07-07 DIAGNOSIS — Z5181 Encounter for therapeutic drug level monitoring: Secondary | ICD-10-CM

## 2018-07-07 DIAGNOSIS — Z952 Presence of prosthetic heart valve: Secondary | ICD-10-CM | POA: Diagnosis not present

## 2018-07-07 DIAGNOSIS — I4891 Unspecified atrial fibrillation: Secondary | ICD-10-CM | POA: Diagnosis not present

## 2018-07-07 LAB — POCT INR: INR: 2.7 (ref 2.0–3.0)

## 2018-07-07 NOTE — Patient Instructions (Signed)
Description   Spoke with pt/wife and instructed pt to continue taking 1 tablet daily. Recheck in 4 weeks.  Call us with any medication changes or concerns # [phone] [facility] # [phone].

## 2018-07-15 ENCOUNTER — Other Ambulatory Visit: Payer: Self-pay | Admitting: Internal Medicine

## 2018-07-21 DIAGNOSIS — R51 Headache: Secondary | ICD-10-CM | POA: Diagnosis not present

## 2018-07-21 DIAGNOSIS — G43719 Chronic migraine without aura, intractable, without status migrainosus: Secondary | ICD-10-CM | POA: Diagnosis not present

## 2018-07-29 ENCOUNTER — Telehealth: Payer: Self-pay

## 2018-07-29 NOTE — Telephone Encounter (Signed)

## 2018-07-31 DIAGNOSIS — Z85828 Personal history of other malignant neoplasm of skin: Secondary | ICD-10-CM | POA: Diagnosis not present

## 2018-07-31 DIAGNOSIS — D1801 Hemangioma of skin and subcutaneous tissue: Secondary | ICD-10-CM | POA: Diagnosis not present

## 2018-07-31 DIAGNOSIS — C4441 Basal cell carcinoma of skin of scalp and neck: Secondary | ICD-10-CM | POA: Diagnosis not present

## 2018-07-31 DIAGNOSIS — L570 Actinic keratosis: Secondary | ICD-10-CM | POA: Diagnosis not present

## 2018-07-31 DIAGNOSIS — D225 Melanocytic nevi of trunk: Secondary | ICD-10-CM | POA: Diagnosis not present

## 2018-07-31 DIAGNOSIS — C44519 Basal cell carcinoma of skin of other part of trunk: Secondary | ICD-10-CM | POA: Diagnosis not present

## 2018-07-31 DIAGNOSIS — C44612 Basal cell carcinoma of skin of right upper limb, including shoulder: Secondary | ICD-10-CM | POA: Diagnosis not present

## 2018-07-31 DIAGNOSIS — L821 Other seborrheic keratosis: Secondary | ICD-10-CM | POA: Diagnosis not present

## 2018-08-04 ENCOUNTER — Other Ambulatory Visit: Payer: Self-pay

## 2018-08-04 ENCOUNTER — Ambulatory Visit: Payer: Medicare Other | Admitting: *Deleted

## 2018-08-04 DIAGNOSIS — Z5181 Encounter for therapeutic drug level monitoring: Secondary | ICD-10-CM

## 2018-08-04 DIAGNOSIS — I4891 Unspecified atrial fibrillation: Secondary | ICD-10-CM

## 2018-08-04 DIAGNOSIS — Z952 Presence of prosthetic heart valve: Secondary | ICD-10-CM | POA: Diagnosis not present

## 2018-08-04 LAB — POCT INR: INR: 3.6 — AB (ref 2.0–3.0)

## 2018-08-04 NOTE — Patient Instructions (Signed)
Description   Hold coumadin today, then continue taking 1 tablet daily. Recheck  INR in 2-3 weeks.  Call us with any medication changes or concerns # [phone] [facility] # [phone].

## 2018-08-12 ENCOUNTER — Telehealth: Payer: Self-pay

## 2018-08-12 NOTE — Telephone Encounter (Signed)

## 2018-08-19 ENCOUNTER — Ambulatory Visit (INDEPENDENT_AMBULATORY_CARE_PROVIDER_SITE_OTHER): Payer: Medicare Other | Admitting: *Deleted

## 2018-08-19 ENCOUNTER — Other Ambulatory Visit: Payer: Self-pay

## 2018-08-19 DIAGNOSIS — I4891 Unspecified atrial fibrillation: Secondary | ICD-10-CM | POA: Diagnosis not present

## 2018-08-19 DIAGNOSIS — Z5181 Encounter for therapeutic drug level monitoring: Secondary | ICD-10-CM | POA: Diagnosis not present

## 2018-08-19 LAB — POCT INR: INR: 2.7 (ref 2.0–3.0)

## 2018-08-19 NOTE — Patient Instructions (Signed)
Description   Continue taking 1 tablet daily. Recheck  INR in 3 weeks.  Call us with any medication changes or concerns # [phone] [facility] # [phone].

## 2018-09-08 ENCOUNTER — Telehealth: Payer: Self-pay

## 2018-09-08 NOTE — Telephone Encounter (Signed)
lmom for prescreen  

## 2018-09-08 NOTE — Telephone Encounter (Signed)

## 2018-09-09 ENCOUNTER — Ambulatory Visit (INDEPENDENT_AMBULATORY_CARE_PROVIDER_SITE_OTHER): Payer: Medicare Other | Admitting: *Deleted

## 2018-09-09 ENCOUNTER — Other Ambulatory Visit: Payer: Self-pay

## 2018-09-09 DIAGNOSIS — I4891 Unspecified atrial fibrillation: Secondary | ICD-10-CM | POA: Diagnosis not present

## 2018-09-09 DIAGNOSIS — Z5181 Encounter for therapeutic drug level monitoring: Secondary | ICD-10-CM

## 2018-09-09 LAB — POCT INR: INR: 3 (ref 2.0–3.0)

## 2018-09-09 NOTE — Patient Instructions (Signed)
Description   Continue taking 1 tablet daily. Recheck  INR in 3 weeks.  Call us with any medication changes or concerns # [phone] [facility] # [phone].

## 2018-09-30 ENCOUNTER — Ambulatory Visit (INDEPENDENT_AMBULATORY_CARE_PROVIDER_SITE_OTHER): Payer: Medicare Other | Admitting: *Deleted

## 2018-09-30 ENCOUNTER — Other Ambulatory Visit: Payer: Self-pay

## 2018-09-30 DIAGNOSIS — I4891 Unspecified atrial fibrillation: Secondary | ICD-10-CM

## 2018-09-30 DIAGNOSIS — Z5181 Encounter for therapeutic drug level monitoring: Secondary | ICD-10-CM

## 2018-09-30 LAB — POCT INR: INR: 2.8 (ref 2.0–3.0)

## 2018-09-30 NOTE — Patient Instructions (Signed)
Description   Continue taking 1 tablet daily. Recheck  INR in 5 weeks.  Call us with any medication changes or concerns # [phone] [facility] # [phone].

## 2018-10-10 ENCOUNTER — Other Ambulatory Visit: Payer: Self-pay | Admitting: Internal Medicine

## 2018-10-19 NOTE — Progress Notes (Signed)
Cardiology Office Note   Date:  10/20/2018   ID:  Robert Richard, Robert Richard Jun 22, 1930, MRN [id number]  PCP:  Robert Rail, MD  Cardiologist:   Robert Carnes, MD   F/U of CAD       History of Present Illness: Robert Richard is a 85 y.o. male with a history ofCAD, s/p CABG x 3 (LIMA to LAD; SVG to OM1; SVG to PDA) and AVR (#23 mm [facility] Ease pericardial valve. Post op course long/complicated. 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  I saw the pt in June 2019     The pt denies CP  Breathing is OK  No dizziness       Current Meds  Medication Sig  . busPIRone (BUSPAR) 15 MG tablet Take one tablet (15 mg total) by mouth two times daily. Need office visit for more refills.  . carbidopa-levodopa (SINEMET IR) 25-100 MG tablet Take 1 tablet by mouth 3 (three) times daily. (Patient taking differently: Take 0.5 tablets by mouth 3 (three) times daily. )  . CARTIA XT 240 MG 24 hr capsule TAKE ONE CAPSULE BY MOUTH DAILY  . famotidine (PEPCID) 20 MG tablet Take 1 tablet (20 mg total) by mouth 2 (two) times daily.  Marland Kitchen gabapentin (NEURONTIN) 100 MG capsule Take 1 capsule (100 mg total) by mouth 2 (two) times daily.  Marland Kitchen ketotifen (ZADITOR) 0.025 % ophthalmic solution Place 1 drop into both eyes daily.   . Lidocaine HCl 4 % SOLN Apply 4 mLs topically as needed (for migraines).   . Multiple Vitamins-Minerals (DAILY MULTIVITAMIN) CAPS Take 1 capsule by mouth daily.   . nitroGLYCERIN (NITROSTAT) 0.4 MG SL tablet Place 0.4 mg under the tongue every 5 (five) minutes as needed for chest pain.  . polyethylene glycol (MIRALAX / GLYCOLAX) packet Take 17 g by mouth daily.  . pravastatin (PRAVACHOL) 40 MG tablet TAKE ONE (1) TABLET BY MOUTH EVERY DAY  . topiramate (TOPAMAX) 25 MG tablet Take 75 mg by mouth daily.  Marland Kitchen warfarin (COUMADIN) 2 MG tablet TAKE ONE TO  ONE AND ONE-HALF TABLETS BY MOUTH OR AS DIRECTED BY COUMADIN CLINIC     Allergies:   Iodinated diagnostic agents and Ioxaglate   Past Medical History:  Diagnosis Date  . Allergic rhinitis   . Anemia   . Arthritis    SHOULDER  . Asthma with bronchitis   . Colon polyp   . COPD (chronic obstructive pulmonary disease) (HCC)    Astmatic bronchitis  . Dysrhythmia 08/2012   NEW ONSET ATRIAL FIBRILATION  . Gout    PMH  . Headache(784.0)   . HLD (hyperlipidemia)   . Nephrolithiasis   . Osteoporosis   . RLS (restless legs syndrome)   . Skin cancer (melanoma) ([place]) 2012   Right neck  . Skin cancer, basal cell    Dr Robert Richard, [facility]    Past Surgical History:  Procedure Laterality Date  . AORTIC VALVE REPLACEMENT N/A 02/20/2013   Procedure: AORTIC VALVE REPLACEMENT (AVR);  Surgeon: Ivin Poot, MD;  Location: [place];  Service: Open Heart Surgery;  Laterality: N/A;  . COLONOSCOPY W/ POLYPECTOMY     Dr Robert Richard  . CORONARY ARTERY BYPASS GRAFT N/A 02/20/2013   Procedure: CORONARY ARTERY BYPASS GRAFTING (CABG);  Surgeon: Ivin Poot, MD;  Location: [place];  Service: Open Heart  Surgery;  Laterality: N/A;  . FINGER SURGERY     for foreign body  . INGUINAL HERNIA REPAIR    . INTRAOPERATIVE TRANSESOPHAGEAL ECHOCARDIOGRAM N/A 02/20/2013   Procedure: INTRAOPERATIVE TRANSESOPHAGEAL ECHOCARDIOGRAM;  Surgeon: Ivin Poot, MD;  Location: [place];  Service: Open Heart Surgery;  Laterality: N/A;  . LEFT AND RIGHT HEART CATHETERIZATION WITH CORONARY ANGIOGRAM N/A 01/05/2013   Procedure: LEFT AND RIGHT HEART CATHETERIZATION WITH CORONARY ANGIOGRAM;  Surgeon: Blane Ohara, MD;  Location: [facility] CATH LAB;  Service: Cardiovascular;  Laterality: N/A;  . LEG SURGERY  06/2010    Dr.Lawson, for blood clott. Left leg   . NECK SURGERY  12/2010   Melanoma ; [facility]   . NOSE SURGERY     Septal Deviation  . RIGHT HEART CATHETERIZATION N/A 01/23/2013   Procedure: RIGHT HEART CATH;  Surgeon: Robert Artist, MD;  [facility] LAB;  Service: Cardiovascular;  Laterality: N/A;  . TEE WITHOUT CARDIOVERSION N/A 01/23/2013   Procedure: TRANSESOPHAGEAL ECHOCARDIOGRAM (TEE);  Surgeon: Robert Artist, MD;  Location: [facility] ENDOSCOPY;  Service: Cardiovascular;  Laterality: N/A;  sign heart cath consent w/tee consent     Social History:  The patient  reports that he quit smoking about 57 years ago. His smoking use included cigarettes. He has a 45.00 pack-year smoking history. He has never used smokeless tobacco. He reports that he does not drink alcohol or use drugs.   Family History:  The patient's family history includes Allergies in his brother, father, mother, and sister; Allergy (severe) in his brother, brother, father, mother, and sister; Asthma in his father; Cancer in his son; Coronary artery disease in his mother; Diabetes in his brother; Emphysema in his brother; Heart attack (age of onset: 103) in his father; Stroke in his father.    ROS:  Please see the history of present illness. All other systems are reviewed and  Negative to the above problem except as noted.    PHYSICAL EXAM: VS:  BP (!) 144/68   Pulse 68   Ht 6\' 1"  (1.854 m)   Wt 143 lb 12.8 oz (65.2 kg)   BMI 18.97 kg/m   GEN: Thin 85 yo  in no acute distress  HEENT: normal  Neck: no JVD, carotid bruits, or masses Cardiac:Irreg Ireg    no murmurs, rubs, or gallops,no edema  Respiratory:  clear to auscultation bilaterally, normal work of breathing GI: soft, nontender, nondistended, + BS  No hepatomegaly  MS: no deformity Moving all extremities   Skin: warm and dry, no rash Neuro:  Strength and sensation are intact Psych: euthymic mood, full affect   EKG:  EKG is ordered today.  Atypical atrial flutter  68 bpm     Lipid Panel    Component Value Date/Time   CHOL 103 10/15/2017 1519   TRIG 126.0 10/15/2017 1519   HDL 31.40 (L) 10/15/2017 1519   CHOLHDL 3 10/15/2017 1519   VLDL 25.2 10/15/2017 1519   LDLCALC 46  10/15/2017 1519      Wt Readings from Last 3 Encounters:  10/20/18 143 lb 12.8 oz (65.2 kg)  11/29/17 142 lb (64.4 kg)  10/28/17 139 lb 2 oz (63.1 kg)      ASSESSMENT AND PLAN:  1  CAD  S/p CABG   NO symptoms of angina     2  Hx AV dz  S/p AVR  Echo to reeval AV prosthesis   3  Chronic atrial flutter    Rate  control and anticoagulation.   Check echo    Consier switch to NOAC  4  HL  Keep on meds  Check lipids today     5  HTN  Follow BP with change in meds    F/U in 12 months   Current medicines are reviewed at length with the patient today.  The patient does not have concerns regarding medicines.  Signed, Robert Carnes, MD  10/20/2018 4:33 PM    [facility] [address], [place], [place]  [address] Phone: [phone]; Fax: [phone]

## 2018-10-20 ENCOUNTER — Other Ambulatory Visit: Payer: Self-pay

## 2018-10-20 ENCOUNTER — Encounter: Payer: Self-pay | Admitting: Internal Medicine

## 2018-10-20 ENCOUNTER — Ambulatory Visit (INDEPENDENT_AMBULATORY_CARE_PROVIDER_SITE_OTHER): Payer: Medicare Other

## 2018-10-20 ENCOUNTER — Ambulatory Visit (INDEPENDENT_AMBULATORY_CARE_PROVIDER_SITE_OTHER): Payer: Medicare Other | Admitting: Internal Medicine

## 2018-10-20 VITALS — BP 144/68 | HR 68 | Ht 73.0 in | Wt 143.8 lb

## 2018-10-20 DIAGNOSIS — I2510 Atherosclerotic heart disease of native coronary artery without angina pectoris: Secondary | ICD-10-CM | POA: Diagnosis not present

## 2018-10-20 DIAGNOSIS — E7849 Other hyperlipidemia: Secondary | ICD-10-CM

## 2018-10-20 DIAGNOSIS — M810 Age-related osteoporosis without current pathological fracture: Secondary | ICD-10-CM | POA: Diagnosis not present

## 2018-10-20 DIAGNOSIS — I10 Essential (primary) hypertension: Secondary | ICD-10-CM | POA: Diagnosis not present

## 2018-10-20 DIAGNOSIS — Z952 Presence of prosthetic heart valve: Secondary | ICD-10-CM

## 2018-10-20 MED ORDER — DENOSUMAB 60 MG/ML ~~LOC~~ SOSY
60.0000 mg | PREFILLED_SYRINGE | Freq: Once | SUBCUTANEOUS | Status: AC
  Administered 2018-10-20: 16:00:00 60 mg via SUBCUTANEOUS

## 2018-10-20 NOTE — Progress Notes (Signed)
Prolia injection given.    Dr. Billey Gosling.

## 2018-10-20 NOTE — Patient Instructions (Signed)
Medication Instructions:  No changes If you need a refill on your cardiac medications before your next appointment, please call your pharmacy.   Lab work: Today: cbc, bmet, lipids, tsh If you have labs (blood work) drawn today and your tests are completely normal, you will receive your results only by: Marland Kitchen MyChart Message (if you have MyChart) OR . A paper copy in the mail If you have any lab test that is abnormal or we need to change your treatment, we will call you to review the results.  Testing/Procedures: Your physician has requested that you have an echocardiogram. Echocardiography is a painless test that uses sound waves to create images of your heart. It provides your doctor with information about the size and shape of your heart and how well your heart's chambers and valves are working. This procedure takes approximately one hour. There are no restrictions for this procedure.   Follow-Up: At [facility], you and your health needs are our priority.  As part of our continuing mission to provide you with exceptional heart care, we have created designated Provider Care Teams.  These Care Teams include your primary Cardiologist (physician) and Advanced Practice Providers (APPs -  Physician Assistants and Nurse Practitioners) who all work together to provide you with the care you need, when you need it. You will need a follow up appointment in:  12 months.  Please call our office 2 months in advance to schedule this appointment.  You may see Dorris Carnes, MD or one of the following Advanced Practice Providers on your designated Care Team: Richardson Dopp, PA-C [place], [place] . Daune Perch, NP  Any Other Special Instructions Will Be Listed Below (If Applicable).

## 2018-10-21 LAB — LIPID PANEL
Chol/HDL Ratio: 3 ratio (ref 0.0–5.0)
Cholesterol, Total: 115 mg/dL (ref 100–199)
HDL: 38 mg/dL — ABNORMAL LOW (ref >39)
LDL Chol Calc (NIH): 59 mg/dL (ref 0–99)
Triglycerides: 94 mg/dL (ref 0–149)
VLDL Cholesterol Cal: 18 mg/dL (ref 5–40)

## 2018-10-22 LAB — CBC
Hematocrit: 35 % — ABNORMAL LOW (ref 37.5–51.0)
Hemoglobin: 11.6 g/dL — ABNORMAL LOW (ref 13.0–17.7)
MCH: 29.5 pg (ref 26.6–33.0)
MCHC: 33.1 g/dL (ref 31.5–35.7)
MCV: 89 fL (ref 79–97)
Platelets: 198 10*3/uL (ref 150–450)
RBC: 3.93 x10E6/uL — ABNORMAL LOW (ref 4.14–5.80)
RDW: 12.6 % (ref 11.6–15.4)
WBC: 10 10*3/uL (ref 3.4–10.8)

## 2018-10-22 LAB — TSH: TSH: 3.16 u[IU]/mL (ref 0.450–4.500)

## 2018-10-22 LAB — BASIC METABOLIC PANEL
BUN/Creatinine Ratio: 13 (ref 10–24)
BUN: 19 mg/dL (ref 8–27)
CO2: 19 mmol/L — ABNORMAL LOW (ref 20–29)
Calcium: 9.2 mg/dL (ref 8.6–10.2)
Chloride: 108 mmol/L — ABNORMAL HIGH (ref 96–106)
Creatinine, Ser: 1.45 mg/dL — ABNORMAL HIGH (ref 0.76–1.27)
GFR calc Af Amer: 50 mL/min/{1.73_m2} — ABNORMAL LOW (ref >59)
GFR calc non Af Amer: 43 mL/min/{1.73_m2} — ABNORMAL LOW (ref >59)
Glucose: 99 mg/dL (ref 65–99)
Potassium: 4.5 mmol/L (ref 3.5–5.2)
Sodium: 142 mmol/L (ref 134–144)

## 2018-10-29 ENCOUNTER — Other Ambulatory Visit (HOSPITAL_COMMUNITY): Payer: Medicare Other

## 2018-11-04 ENCOUNTER — Ambulatory Visit (HOSPITAL_COMMUNITY): Payer: Medicare Other | Attending: Cardiology

## 2018-11-04 ENCOUNTER — Other Ambulatory Visit: Payer: Self-pay

## 2018-11-04 ENCOUNTER — Ambulatory Visit (INDEPENDENT_AMBULATORY_CARE_PROVIDER_SITE_OTHER): Payer: Medicare Other | Admitting: *Deleted

## 2018-11-04 DIAGNOSIS — E7849 Other hyperlipidemia: Secondary | ICD-10-CM | POA: Insufficient documentation

## 2018-11-04 DIAGNOSIS — Z952 Presence of prosthetic heart valve: Secondary | ICD-10-CM | POA: Insufficient documentation

## 2018-11-04 DIAGNOSIS — I10 Essential (primary) hypertension: Secondary | ICD-10-CM | POA: Insufficient documentation

## 2018-11-04 DIAGNOSIS — Z5181 Encounter for therapeutic drug level monitoring: Secondary | ICD-10-CM | POA: Diagnosis not present

## 2018-11-04 DIAGNOSIS — I2510 Atherosclerotic heart disease of native coronary artery without angina pectoris: Secondary | ICD-10-CM | POA: Insufficient documentation

## 2018-11-04 DIAGNOSIS — I4891 Unspecified atrial fibrillation: Secondary | ICD-10-CM | POA: Diagnosis not present

## 2018-11-04 LAB — POCT INR: INR: 2.7 (ref 2.0–3.0)

## 2018-11-04 NOTE — Patient Instructions (Signed)
Description   Continue taking 1 tablet daily. Recheck  INR in 7 weeks.  Call us with any medication changes or concerns # [phone] [facility] # [phone].     

## 2018-11-10 ENCOUNTER — Other Ambulatory Visit: Payer: Self-pay | Admitting: Internal Medicine

## 2018-11-10 ENCOUNTER — Other Ambulatory Visit: Payer: Self-pay

## 2018-11-10 MED ORDER — GABAPENTIN 100 MG PO CAPS: 100.0000 mg | ORAL_CAPSULE | Freq: Two times a day (BID) | ORAL | 1 refills | Status: DC

## 2018-12-03 ENCOUNTER — Ambulatory Visit (INDEPENDENT_AMBULATORY_CARE_PROVIDER_SITE_OTHER): Payer: Medicare Other

## 2018-12-03 ENCOUNTER — Other Ambulatory Visit: Payer: Self-pay

## 2018-12-03 DIAGNOSIS — Z23 Encounter for immunization: Secondary | ICD-10-CM | POA: Diagnosis not present

## 2018-12-22 ENCOUNTER — Ambulatory Visit (INDEPENDENT_AMBULATORY_CARE_PROVIDER_SITE_OTHER): Payer: Medicare Other | Admitting: *Deleted

## 2018-12-22 ENCOUNTER — Other Ambulatory Visit: Payer: Self-pay

## 2018-12-22 DIAGNOSIS — I4891 Unspecified atrial fibrillation: Secondary | ICD-10-CM | POA: Diagnosis not present

## 2018-12-22 DIAGNOSIS — Z5181 Encounter for therapeutic drug level monitoring: Secondary | ICD-10-CM

## 2018-12-22 LAB — POCT INR: INR: 2.5 (ref 2.0–3.0)

## 2018-12-22 NOTE — Patient Instructions (Signed)
Description   Continue taking 1 tablet daily. Recheck  INR in 8 weeks.  Call us with any medication changes or concerns # [phone] [facility], Main # [phone].     

## 2019-01-12 ENCOUNTER — Other Ambulatory Visit: Payer: Self-pay

## 2019-01-12 ENCOUNTER — Other Ambulatory Visit: Payer: Self-pay | Admitting: Internal Medicine

## 2019-01-12 MED ORDER — GABAPENTIN 100 MG PO CAPS: 100.0000 mg | ORAL_CAPSULE | Freq: Two times a day (BID) | ORAL | 0 refills | Status: DC

## 2019-01-13 NOTE — Progress Notes (Signed)
Subjective:    Patient ID: Robert Richard, male    DOB: October 18, 1930, 85 y.o.   MRN: [id number]  HPI The patient is here for an acute visit.  He is here with his wife.   Left abdominal pain: In the past 2 weeks he has had 2 episodes of left mid abdominal pain.  Both episodes the pain was relieved with Tylenol.  He feels the pain was more severe.  There is no obvious cause for the pain.  It occurred when he was at rest.    He denies any fevers, chills, change in bowel habits, constipation, diarrhea, nausea, blood in the stool and urinary symptoms.  He denies any back pain or numbness/tingling.  He denies other symptoms.  Reviewing his chart he did have a small bowel obstruction in the left mid abdomen 1 years ago.  Him and his wife do not recall this and cannot provide further information about it.   Medications and allergies reviewed with patient and updated if appropriate.  Patient Active Problem List   Diagnosis Date Noted  . GERD (gastroesophageal reflux disease) 11/29/2017  . Confusion 10/15/2017  . Anxiety 11/05/2016  . Urinary frequency 09/12/2016  . Cough 09/12/2016  . Prediabetes 05/01/2016  . Pain in both lower extremities 05/01/2016  . Bilateral hearing loss 05/01/2016  . Migraine 04/26/2015  . Aortic valve replaced 04/21/2015  . Subacute confusional state 12/14/2014  . Melanoma of neck ([place]) 07/05/2014  . Encounter for therapeutic drug monitoring 03/20/2013  . CAD (coronary artery disease) 03/13/2013  . Atrial fibrillation, controlled ([place]) 03/13/2013  . Protein-calorie malnutrition, severe ([place]) 02/27/2013  . S/P CABG x 3 02/20/2013  . History of embolectomy 09/22/2012  . CKD (chronic kidney disease) 09/22/2012  . Syncope 09/15/2012  . Melanoma ([place]) 08/26/2012  . Peripheral vascular disease, unspecified ([place]) 07/08/2012  . Mitral stenosis 07/27/2010  . Hyperlipidemia 10/25/2009  . Essential hypertension 10/25/2009  . NEPHROLITHIASIS, HX OF 10/25/2009  .  SLEEP DISORDER 11/29/2008  . SNORING 10/14/2008  . APNEA 10/05/2008  . SKIN CANCER, HX OF 10/05/2008  . RESTLESS LEG SYNDROME 09/11/2007  . Allergic rhinitis, cause unspecified 09/11/2007  . GOUT 03/15/2006  . ASTHMA 03/15/2006  . COPD 03/15/2006  . Osteoporosis 03/15/2006  . COLONIC POLYPS, HX OF 03/15/2006    Current Outpatient Medications on File Prior to Visit  Medication Sig Dispense Refill  . busPIRone (BUSPAR) 15 MG tablet TAKE 1 TABLET BY MOUTH TWICE A DAY. NEEDOFFICE VISIT FOR MORE REFILLS 60 tablet 0  . carbidopa-levodopa (SINEMET IR) 25-100 MG tablet Take 1 tablet by mouth 3 (three) times daily. (Patient taking differently: Take 0.5 tablets by mouth 3 (three) times daily. ) 90 tablet 2  . CARTIA XT 240 MG 24 hr capsule TAKE ONE CAPSULE BY MOUTH DAILY 90 capsule 3  . famotidine (PEPCID) 20 MG tablet Take 1 tablet (20 mg total) by mouth 2 (two) times daily. 180 tablet 1  . gabapentin (NEURONTIN) 100 MG capsule Take 1 capsule (100 mg total) by mouth 2 (two) times daily. 60 capsule 0  . ketotifen (ZADITOR) 0.025 % ophthalmic solution Place 1 drop into both eyes daily.     . Lidocaine HCl 4 % SOLN Apply 4 mLs topically as needed (for migraines).     . Multiple Vitamins-Minerals (DAILY MULTIVITAMIN) CAPS Take 1 capsule by mouth daily.     . nitroGLYCERIN (NITROSTAT) 0.4 MG SL tablet Place 0.4 mg under the tongue every 5 (five) minutes as needed  for chest pain.    . pravastatin (PRAVACHOL) 40 MG tablet TAKE ONE (1) TABLET BY MOUTH EVERY DAY 90 tablet 0  . topiramate (TOPAMAX) 25 MG tablet Take 75 mg by mouth daily.    Marland Kitchen warfarin (COUMADIN) 2 MG tablet TAKE AS DIRECTED BY COUMADIN CLINIC. 40 tablet 3   No current facility-administered medications on file prior to visit.     Past Medical History:  Diagnosis Date  . Allergic rhinitis   . Anemia   . Arthritis    SHOULDER  . Asthma with bronchitis   . Colon polyp   . COPD (chronic obstructive pulmonary disease) (HCC)     Astmatic bronchitis  . Dysrhythmia 08/2012   NEW ONSET ATRIAL FIBRILATION  . Gout    PMH  . Headache(784.0)   . HLD (hyperlipidemia)   . Nephrolithiasis   . Osteoporosis   . RLS (restless legs syndrome)   . Skin cancer (melanoma) ([place]) 2012   Right neck  . Skin cancer, basal cell    Dr Nevada Crane, [facility]    Past Surgical History:  Procedure Laterality Date  . AORTIC VALVE REPLACEMENT N/A 02/20/2013   Procedure: AORTIC VALVE REPLACEMENT (AVR);  Surgeon: Ivin Poot, MD;  Location: [place];  Service: Open Heart Surgery;  Laterality: N/A;  . COLONOSCOPY W/ POLYPECTOMY     Dr Deatra Ina  . CORONARY ARTERY BYPASS GRAFT N/A 02/20/2013   Procedure: CORONARY ARTERY BYPASS GRAFTING (CABG);  Surgeon: Ivin Poot, MD;  Location: [place];  Service: Open Heart Surgery;  Laterality: N/A;  . FINGER SURGERY     for foreign body  . INGUINAL HERNIA REPAIR    . INTRAOPERATIVE TRANSESOPHAGEAL ECHOCARDIOGRAM N/A 02/20/2013   Procedure: INTRAOPERATIVE TRANSESOPHAGEAL ECHOCARDIOGRAM;  Surgeon: Ivin Poot, MD;  Location: [place];  Service: Open Heart Surgery;  Laterality: N/A;  . LEFT AND RIGHT HEART CATHETERIZATION WITH CORONARY ANGIOGRAM N/A 01/05/2013   Procedure: LEFT AND RIGHT HEART CATHETERIZATION WITH CORONARY ANGIOGRAM;  Surgeon: Blane Ohara, MD;  Location: [facility] CATH LAB;  Service: Cardiovascular;  Laterality: N/A;  . LEG SURGERY  06/2010    Dr.Lawson, for blood clott. Left leg   . NECK SURGERY  12/2010   Melanoma ; [facility]   . NOSE SURGERY     Septal Deviation  . RIGHT HEART CATHETERIZATION N/A 01/23/2013   Procedure: RIGHT HEART CATH;  Surgeon: Jolaine Artist, MD;  Location: [facility] CATH LAB;  Service: Cardiovascular;  Laterality: N/A;  . TEE WITHOUT CARDIOVERSION N/A 01/23/2013   Procedure: TRANSESOPHAGEAL ECHOCARDIOGRAM (TEE);  Surgeon: Jolaine Artist, MD;  Location: [facility] ENDOSCOPY;  Service: Cardiovascular;  Laterality: N/A;  sign heart cath consent w/tee consent    Social History    Socioeconomic History  . Marital status: Married    Spouse name: Not on file  . Number of children: Not on file  . Years of education: Not on file  . Highest education level: Not on file  Occupational History  . Occupation: retired    Fish farm manager: [employer]  . Financial resource strain: Not on file  . Food insecurity    Worry: Not on file    Inability: Not on file  . Transportation needs    Medical: Not on file    Non-medical: Not on file  Tobacco Use  . Smoking status: Former Smoker    Packs/day: 3.00    Years: 15.00    Pack years: 45.00    Types: Cigarettes  Quit date: 02/19/1961    Years since quitting: 57.9  . Smokeless tobacco: Never Used  Substance and Sexual Activity  . Alcohol use: No  . Drug use: No  . Sexual activity: Not on file  Lifestyle  . Physical activity    Days per week: Not on file    Minutes per session: Not on file  . Stress: Not on file  Relationships  . Social Herbalist on phone: Not on file    Gets together: Not on file    Attends religious service: Not on file    Active member of club or organization: Not on file    Attends meetings of clubs or organizations: Not on file    Relationship status: Not on file  Other Topics Concern  . Not on file  Social History Narrative  . Not on file    Family History  Problem Relation Age of Onset  . Allergies Mother   . Coronary artery disease Mother   . Allergy (severe) Mother   . Allergies Father   . Stroke Father   . Heart attack Father 79  . Asthma Father   . Allergy (severe) Father   . Allergies Sister   . Allergy (severe) Sister   . Allergies Brother        x2  . Allergy (severe) Brother   . Diabetes Brother   . Allergy (severe) Brother   . Emphysema Brother        smoked  . Cancer Son     Review of Systems  Constitutional: Negative for chills and fever.  Gastrointestinal: Positive for abdominal pain (Left middle). Negative for blood in stool,  constipation, diarrhea and nausea.  Genitourinary: Negative for dysuria and hematuria.  Musculoskeletal: Negative for back pain.  Neurological: Negative for numbness.       Objective:   Vitals:   01/14/19 1013  BP: 132/70  Pulse: 65  Resp: 16  Temp: 97.6 F (36.4 C)  SpO2: 98%   BP Readings from Last 3 Encounters:  01/14/19 132/70  10/20/18 (!) 144/68  11/29/17 140/70   Wt Readings from Last 3 Encounters:  01/14/19 146 lb (66.2 kg)  10/20/18 143 lb 12.8 oz (65.2 kg)  11/29/17 142 lb (64.4 kg)   Body mass index is 19.26 kg/m.   Physical Exam Constitutional:      General: He is not in acute distress.    Appearance: Normal appearance. He is not ill-appearing.  HENT:     Head: Normocephalic and atraumatic.  Cardiovascular:     Rate and Rhythm: Normal rate and regular rhythm.  Pulmonary:     Effort: Pulmonary effort is normal.     Breath sounds: Normal breath sounds.  Abdominal:     General: There is no distension.     Palpations: Abdomen is soft. There is no mass.     Tenderness: There is no abdominal tenderness. There is no right CVA tenderness, left CVA tenderness, guarding or rebound.     Hernia: A hernia (Small ventral-no discomfort, small umbilical-no discomfort) is present.  Skin:    General: Skin is warm and dry.  Neurological:     Mental Status: He is alert.            Assessment & Plan:    See Problem List for Assessment and Plan of chronic medical problems.

## 2019-01-14 ENCOUNTER — Ambulatory Visit (INDEPENDENT_AMBULATORY_CARE_PROVIDER_SITE_OTHER)
Admission: RE | Admit: 2019-01-14 | Discharge: 2019-01-14 | Disposition: A | Discharge status: Home / Self Care | Payer: Medicare Other | Source: Ambulatory Visit | Attending: Internal Medicine | Admitting: Internal Medicine

## 2019-01-14 ENCOUNTER — Ambulatory Visit (INDEPENDENT_AMBULATORY_CARE_PROVIDER_SITE_OTHER): Payer: Medicare Other | Admitting: Internal Medicine

## 2019-01-14 ENCOUNTER — Other Ambulatory Visit: Payer: Self-pay

## 2019-01-14 ENCOUNTER — Encounter: Payer: Self-pay | Admitting: Internal Medicine

## 2019-01-14 ENCOUNTER — Other Ambulatory Visit (INDEPENDENT_AMBULATORY_CARE_PROVIDER_SITE_OTHER): Payer: Medicare Other

## 2019-01-14 ENCOUNTER — Telehealth: Payer: Self-pay | Admitting: Internal Medicine

## 2019-01-14 VITALS — BP 132/70 | HR 65 | Temp 97.6°F | Resp 16 | Ht 73.0 in | Wt 146.0 lb

## 2019-01-14 DIAGNOSIS — R109 Unspecified abdominal pain: Secondary | ICD-10-CM

## 2019-01-14 LAB — COMPREHENSIVE METABOLIC PANEL
ALT: 4 U/L (ref 0–53)
AST: 18 U/L (ref 0–37)
Albumin: 4 g/dL (ref 3.5–5.2)
Alkaline Phosphatase: 91 U/L (ref 39–117)
BUN: 18 mg/dL (ref 6–23)
CO2: 21 mEq/L (ref 19–32)
Calcium: 9.5 mg/dL (ref 8.4–10.5)
Chloride: 112 mEq/L (ref 96–112)
Creatinine, Ser: 1.31 mg/dL (ref 0.40–1.50)
GFR: 51.8 mL/min — ABNORMAL LOW (ref >60.00)
Glucose, Bld: 87 mg/dL (ref 70–99)
Potassium: 4.7 mEq/L (ref 3.5–5.1)
Sodium: 141 mEq/L (ref 135–145)
Total Bilirubin: 0.5 mg/dL (ref 0.2–1.2)
Total Protein: 8.3 g/dL (ref 6.0–8.3)

## 2019-01-14 LAB — CBC WITH DIFFERENTIAL/PLATELET
Basophils Absolute: 0.1 10*3/uL (ref 0.0–0.1)
Basophils Relative: 0.5 % (ref 0.0–3.0)
Eosinophils Absolute: 0.1 10*3/uL (ref 0.0–0.7)
Eosinophils Relative: 0.9 % (ref 0.0–5.0)
HCT: 35.4 % — ABNORMAL LOW (ref 39.0–52.0)
Hemoglobin: 11.6 g/dL — ABNORMAL LOW (ref 13.0–17.0)
Lymphocytes Relative: 22.3 % (ref 12.0–46.0)
Lymphs Abs: 2.3 10*3/uL (ref 0.7–4.0)
MCHC: 32.8 g/dL (ref 30.0–36.0)
MCV: 89.7 fl (ref 78.0–100.0)
Monocytes Absolute: 1.4 10*3/uL — ABNORMAL HIGH (ref 0.1–1.0)
Monocytes Relative: 13.9 % — ABNORMAL HIGH (ref 3.0–12.0)
Neutro Abs: 6.4 10*3/uL (ref 1.4–7.7)
Neutrophils Relative %: 62.4 % (ref 43.0–77.0)
Platelets: 215 10*3/uL (ref 150.0–400.0)
RBC: 3.94 Mil/uL — ABNORMAL LOW (ref 4.22–5.81)
RDW: 14.6 % (ref 11.5–15.5)
WBC: 10.3 10*3/uL (ref 4.0–10.5)

## 2019-01-14 LAB — AMYLASE: Amylase: 72 U/L (ref 27–131)

## 2019-01-14 LAB — LIPASE: Lipase: 38 U/L (ref 11.0–59.0)

## 2019-01-14 NOTE — Telephone Encounter (Addendum)
Wife aware of results. Will try the stool softener first to see if that helps relieve pts pain. Will call us and let us know if things do not get better.

## 2019-01-14 NOTE — Patient Instructions (Addendum)
Have blood work and an x-ray today.     We will call you with the results today.  We will then decide about ordering a CT scan.

## 2019-01-14 NOTE — Assessment & Plan Note (Signed)
He has had 2 episodes of left mid abdominal pain-he states the pain has been severe, but has been relieved with Tylenol No other symptoms-no changes in bowels, no urinary symptoms Remote history of SBO in this area 20 years ago Nontender on exam We will check blood work including CBC, CMP, amylase and lipase We will check abdominal x-ray to make sure his bowel gas pattern is normal and there is no obvious obstruction, constipation Consider CT scan depending on above results and pain Discussed with him that if the pain comes and does not go away and needs to go to the emergency room.

## 2019-01-14 NOTE — Telephone Encounter (Signed)
His blood work looks good.  His anemia is mild and stable.  His other blood work is normal.    His xray shows moderate amount of stool in his colon, but no other concerning findings.. If there is any constipation he should be on a stool softener on a daily basis.  We can order the CT scan if they like and try to get it without contrast given his allergy.  If they want to see if the pain recurs they can let me know in the next several days if he has the pain again and then we can order the CT at that time.  It is difficult to say what the cause of the pain is at this time.

## 2019-01-20 DIAGNOSIS — R519 Headache, unspecified: Secondary | ICD-10-CM | POA: Diagnosis not present

## 2019-01-20 DIAGNOSIS — G43719 Chronic migraine without aura, intractable, without status migrainosus: Secondary | ICD-10-CM | POA: Diagnosis not present

## 2019-01-26 ENCOUNTER — Other Ambulatory Visit: Payer: Self-pay

## 2019-01-28 ENCOUNTER — Other Ambulatory Visit: Payer: Self-pay

## 2019-01-28 MED ORDER — CARBIDOPA-LEVODOPA 25-100 MG PO TABS: 1.0000 | ORAL_TABLET | Freq: Three times a day (TID) | ORAL | 2 refills | Status: DC

## 2019-01-30 DIAGNOSIS — C4441 Basal cell carcinoma of skin of scalp and neck: Secondary | ICD-10-CM | POA: Diagnosis not present

## 2019-01-30 DIAGNOSIS — L814 Other melanin hyperpigmentation: Secondary | ICD-10-CM | POA: Diagnosis not present

## 2019-01-30 DIAGNOSIS — D225 Melanocytic nevi of trunk: Secondary | ICD-10-CM | POA: Diagnosis not present

## 2019-01-30 DIAGNOSIS — Z85828 Personal history of other malignant neoplasm of skin: Secondary | ICD-10-CM | POA: Diagnosis not present

## 2019-01-30 DIAGNOSIS — L821 Other seborrheic keratosis: Secondary | ICD-10-CM | POA: Diagnosis not present

## 2019-01-30 DIAGNOSIS — C44519 Basal cell carcinoma of skin of other part of trunk: Secondary | ICD-10-CM | POA: Diagnosis not present

## 2019-01-30 DIAGNOSIS — D0339 Melanoma in situ of other parts of face: Secondary | ICD-10-CM | POA: Diagnosis not present

## 2019-02-16 ENCOUNTER — Other Ambulatory Visit: Payer: Self-pay

## 2019-02-16 ENCOUNTER — Ambulatory Visit (INDEPENDENT_AMBULATORY_CARE_PROVIDER_SITE_OTHER): Payer: Medicare Other | Admitting: *Deleted

## 2019-02-16 DIAGNOSIS — Z5181 Encounter for therapeutic drug level monitoring: Secondary | ICD-10-CM | POA: Diagnosis not present

## 2019-02-16 DIAGNOSIS — I4891 Unspecified atrial fibrillation: Secondary | ICD-10-CM | POA: Diagnosis not present

## 2019-02-16 LAB — POCT INR: INR: 3 (ref 2.0–3.0)

## 2019-02-16 NOTE — Patient Instructions (Signed)
Description   Continue taking 1 tablet daily. Recheck  INR in 8 weeks.  Call us with any medication changes or concerns # [phone] [facility], Main # [phone].

## 2019-02-17 ENCOUNTER — Other Ambulatory Visit: Payer: Self-pay | Admitting: Internal Medicine

## 2019-02-23 ENCOUNTER — Telehealth: Payer: Self-pay | Admitting: Internal Medicine

## 2019-02-23 NOTE — Telephone Encounter (Signed)
  Patient was put on doxycycline this morning after having melanoma removed under his eye. His wife is calling because they were told to contact the coumadin clinic to see if his warfarin would need to be adjusted. Please advise.

## 2019-02-23 NOTE — Telephone Encounter (Signed)
Returned call to the wife and obtained the details on doxycycline. Wife states the pt is taking doxycyline 100mg  twice a day for 5 days and started today for melanoma being removed. Advised the med can interact with Warfarin as she is aware as she states the Pharmacist made her aware when she picked up the med. Advised we will need to check her INR sooner and they are available on Thursday and an appointment was set.

## 2019-02-26 ENCOUNTER — Other Ambulatory Visit: Payer: Self-pay

## 2019-02-26 ENCOUNTER — Ambulatory Visit: Payer: Medicare Other | Admitting: *Deleted

## 2019-02-26 DIAGNOSIS — I4891 Unspecified atrial fibrillation: Secondary | ICD-10-CM | POA: Diagnosis not present

## 2019-02-26 DIAGNOSIS — Z5181 Encounter for therapeutic drug level monitoring: Secondary | ICD-10-CM

## 2019-02-26 LAB — POCT INR: INR: 3 (ref 2.0–3.0)

## 2019-02-26 NOTE — Patient Instructions (Signed)
Description   Continue taking 1 tablet daily. Recheck  INR in 8 weeks.  Call us with any medication changes or concerns # [phone] [facility], Main # [phone].

## 2019-03-18 ENCOUNTER — Other Ambulatory Visit: Payer: Self-pay | Admitting: Internal Medicine

## 2019-03-19 ENCOUNTER — Other Ambulatory Visit: Payer: Self-pay | Admitting: Internal Medicine

## 2019-03-19 ENCOUNTER — Other Ambulatory Visit: Payer: Self-pay

## 2019-03-19 MED ORDER — BUSPIRONE HCL 15 MG PO TABS: ORAL_TABLET | ORAL | 0 refills | Status: DC

## 2019-03-19 NOTE — Telephone Encounter (Signed)
Pt wife called wanting to know why buspbar refill was denied. Wife states pt was seen in office in Nov. Please call her back [phone].

## 2019-03-19 NOTE — Telephone Encounter (Signed)
Med has been corrected and sent to pharmacy.

## 2019-03-26 ENCOUNTER — Telehealth: Payer: Medicare Other | Admitting: Neurology

## 2019-03-27 ENCOUNTER — Ambulatory Visit: Payer: Medicare Other | Admitting: Neurology

## 2019-03-30 ENCOUNTER — Telehealth (INDEPENDENT_AMBULATORY_CARE_PROVIDER_SITE_OTHER): Payer: Medicare Other | Admitting: Neurology

## 2019-03-30 ENCOUNTER — Other Ambulatory Visit: Payer: Self-pay

## 2019-03-30 DIAGNOSIS — M79604 Pain in right leg: Secondary | ICD-10-CM

## 2019-03-30 DIAGNOSIS — M79605 Pain in left leg: Secondary | ICD-10-CM | POA: Diagnosis not present

## 2019-03-30 DIAGNOSIS — G20 Parkinson's disease: Secondary | ICD-10-CM

## 2019-03-30 MED ORDER — CARBIDOPA-LEVODOPA 25-100 MG PO TABS: ORAL_TABLET | ORAL | 3 refills | Status: DC

## 2019-03-30 MED ORDER — GABAPENTIN 100 MG PO CAPS: 100.0000 mg | ORAL_CAPSULE | Freq: Every day | ORAL | 3 refills | Status: DC

## 2019-03-30 NOTE — Progress Notes (Signed)
   Due to the COVID-19 crisis, this virtual check-in visit was done via telephone from my office and it was initiated and consent given by this patient and or family.   Telephone (Audio) Visit The purpose of this telephone visit is to provide medical care while limiting exposure to the novel coronavirus.    Consent was obtained for telephone visit and initiated by pt/family:  Yes.   Answered questions that patient had about telehealth interaction:  Yes.   I discussed the limitations, risks, security and privacy concerns of performing an evaluation and management service by telephone. I also discussed with the patient that there may be a patient responsible charge related to this service. The patient expressed understanding and agreed to proceed.  Pt location: Home Physician Location: office Name of referring provider:  [name], MD I connected with .Robert Richard at patients initiation/request on 03/30/2019 at  8:30 AM EST by telephone and verified that I am speaking with the correct person using two identifiers.  Pt MRN:  [id number] Pt DOB:  March 12, 1930   History of Present Illness: This is a 86 year-old man returning for follow-up of parkinson's disease and bilateral leg pain.  Over the past year, there has been no significant change - his leg pain remains well-controlled on gabapentin and he has been compliant with sinemet 1 tab TID.  He ran out of his medication last month and noticed that stiffness in the legs was getting worse again.   He has been very sedentary due to the pandemic.  He walks with a cane and has not suffered any falls.  Denies any tremors, difficulty swallowing/talking.  He appeared groggy on the phone and wife reports that he usually is less alert in the morning for a few hours, which improves by afternoon.  Memory is fair, he continues to be forgetful, nothing which has significantly changed. Mood and sleep is good.  No hallucinations.    Assessment and Plan:   1.   Parkinsonism, akinetic rigid type.   - Refill sent for sinemet 1 tab at 8am, 1pm, and 6pm  - Encouraged him to try to stay active and do home exercises  2.  Bilateral achy leg pain, stable  - Refill sent for gabapentin 100mg    3.  Cognitive changes possible Lewy Body Dementia given fluctuating alertness and memory changes.  No hallucinations  - consider exelon going forward, given overall low benefit decided to hold off for now   Follow Up Instructions:   I discussed the assessment and treatment plan with the patient. The patient was provided an opportunity to ask questions and all were answered. The patient agreed with the plan and demonstrated an understanding of the instructions.   The patient was advised to call back or seek an in-person evaluation if the symptoms worsen or if the condition fails to improve as anticipated.    Total Time spent in visit with the patient was:  10 min, of which 100% of the time was spent in counseling and/or coordinating care.   Pt understands and agrees with the plan of care outlined.     Alda Berthold, DO

## 2019-04-13 ENCOUNTER — Ambulatory Visit: Payer: Medicare Other | Admitting: *Deleted

## 2019-04-13 ENCOUNTER — Other Ambulatory Visit: Payer: Self-pay

## 2019-04-13 DIAGNOSIS — I4891 Unspecified atrial fibrillation: Secondary | ICD-10-CM

## 2019-04-13 DIAGNOSIS — Z5181 Encounter for therapeutic drug level monitoring: Secondary | ICD-10-CM | POA: Diagnosis not present

## 2019-04-13 LAB — POCT INR: INR: 2 (ref 2.0–3.0)

## 2019-04-13 NOTE — Patient Instructions (Signed)
Description   Continue taking 1 tablet daily. Recheck  INR in 8 weeks.  Call us with any medication changes or concerns # [phone] [facility], Main # [phone].

## 2019-04-20 ENCOUNTER — Other Ambulatory Visit: Payer: Self-pay | Admitting: Internal Medicine

## 2019-05-06 ENCOUNTER — Ambulatory Visit: Payer: Medicare Other

## 2019-05-12 ENCOUNTER — Other Ambulatory Visit: Payer: Self-pay | Admitting: Internal Medicine

## 2019-06-04 ENCOUNTER — Other Ambulatory Visit: Payer: Self-pay | Admitting: Internal Medicine

## 2019-06-04 MED ORDER — WARFARIN SODIUM 2 MG PO TABS: ORAL_TABLET | ORAL | 0 refills | Status: DC

## 2019-06-04 MED ORDER — WARFARIN SODIUM 2 MG PO TABS: ORAL_TABLET | ORAL | 3 refills | Status: DC

## 2019-06-04 NOTE — Addendum Note (Signed)
Addended by: Johny Shock B on: 06/04/2019 12:07 PM   Modules accepted: Orders

## 2019-06-04 NOTE — Telephone Encounter (Signed)
°*  STAT* If patient is at the pharmacy, call can be transferred to refill team.   1. Which medications need to be refilled? (please list name of each medication and dose if known)  warfarin (COUMADIN) 2 MG tablet diltiazam 240 mg   2. Which pharmacy/location (including street and city if local pharmacy) is medication to be sent to?  [place]  3. Do they need a 30 day or 90 day supply? 90 day

## 2019-06-08 ENCOUNTER — Other Ambulatory Visit: Payer: Self-pay

## 2019-06-08 ENCOUNTER — Ambulatory Visit: Payer: Medicare Other | Admitting: *Deleted

## 2019-06-08 DIAGNOSIS — Z5181 Encounter for therapeutic drug level monitoring: Secondary | ICD-10-CM

## 2019-06-08 DIAGNOSIS — I4891 Unspecified atrial fibrillation: Secondary | ICD-10-CM

## 2019-06-08 LAB — POCT INR: INR: 2.5 (ref 2.0–3.0)

## 2019-06-08 NOTE — Patient Instructions (Signed)
Description   Continue taking 1 tablet daily. Recheck  INR in 8 weeks.  Call us with any medication changes or concerns # [phone] [facility], Main # [phone].

## 2019-06-16 ENCOUNTER — Other Ambulatory Visit: Payer: Self-pay | Admitting: Internal Medicine

## 2019-07-21 ENCOUNTER — Other Ambulatory Visit: Payer: Self-pay | Admitting: Internal Medicine

## 2019-08-02 DIAGNOSIS — G20 Parkinson's disease: Secondary | ICD-10-CM | POA: Insufficient documentation

## 2019-08-02 DIAGNOSIS — R4189 Other symptoms and signs involving cognitive functions and awareness: Secondary | ICD-10-CM | POA: Insufficient documentation

## 2019-08-02 NOTE — Patient Instructions (Addendum)
If your leg pain continues let me know and I can refer you to a vascular surgeon.   Blood work was ordered.     Medications reviewed and updated.  Changes include :     Start taking pravastatin at night.  Start pantoprazole 20 mg daily for heartburn.  It is best to take 30 minutes prior to a meal.   Your prescription(s) have been submitted to your pharmacy. Please take as directed and contact our office if you believe you are having problem(s) with the medication(s).    Please followup in 6 months

## 2019-08-02 NOTE — Progress Notes (Signed)
Subjective:    Patient ID: Robert Richard, male    DOB: [birth year], 86 y.o.   MRN: [id number]  HPI The patient is here for follow up of their chronic medical problems, including prediabetes, CAD, Afib, hyperlipidemia, GERD ,anxiety, OP, ckd.  His wife is here with him and she helps provide some history because of memory issues.  He is exercising minimally.       He started having GERD two weeks ago. No change in diet.  Appetite is decreased.  After reviewing his medication list he is not taking the Pepcid.  Him and his wife deny any diet changes.  B/l calf pain.  He continues to have bilateral calf pain.  He denies pain higher up in his legs.  This is not a new problem-a couple of years ago it was on the left leg.  Dr. Posey Pronto is treating him for possible neuropathy with gabapentin.  He notices the pain with walking and it does resolve with rest.  He is not the best historian so it is hard to know if other activities cause it.  He did ride his bike the other day and he thinks it felt better.  He plans on doing on more regular basis.  He has seen vascular in the past and does have some known peripheral vascular disease, but he has not seen them recently.    Medications and allergies reviewed with patient and updated if appropriate.  Patient Active Problem List   Diagnosis Date Noted  . Parkinsonism ([place]) 08/02/2019  . Cognitive changes 08/02/2019  . Left sided abdominal pain 01/14/2019  . GERD (gastroesophageal reflux disease) 11/29/2017  . Confusion 10/15/2017  . Anxiety 11/05/2016  . Cough 09/12/2016  . Prediabetes 05/01/2016  . Pain in both lower extremities 05/01/2016  . Bilateral hearing loss 05/01/2016  . Migraine 04/26/2015  . Aortic valve replaced 04/21/2015  . Melanoma of neck ([place]) 07/05/2014  . Encounter for therapeutic drug monitoring 03/20/2013  . CAD (coronary artery disease) 03/13/2013  . Atrial fibrillation, controlled ([place]) 03/13/2013  . Protein-calorie  malnutrition, severe ([place]) 02/27/2013  . S/P CABG x 3 02/20/2013  . History of embolectomy 09/22/2012  . CKD (chronic kidney disease) 09/22/2012  . Syncope 09/15/2012  . Peripheral vascular disease, unspecified ([place]) 07/08/2012  . Mitral stenosis 07/27/2010  . Hyperlipidemia 10/25/2009  . NEPHROLITHIASIS, HX OF 10/25/2009  . SNORING 10/14/2008  . APNEA 10/05/2008  . RESTLESS LEG SYNDROME 09/11/2007  . Allergic rhinitis, cause unspecified 09/11/2007  . GOUT 03/15/2006  . ASTHMA 03/15/2006  . COPD 03/15/2006  . Osteoporosis 03/15/2006  . COLONIC POLYPS, HX OF 03/15/2006    Current Outpatient Medications on File Prior to Visit  Medication Sig Dispense Refill  . busPIRone (BUSPAR) 15 MG tablet TAKE ONE TABLET BY MOUTH TWICE A DAY Annual appt is due w/labs must see provider for future refills 60 tablet 0  . carbidopa-levodopa (SINEMET IR) 25-100 MG tablet Take 1 tablet at 8am, 1pm, and 6pm 270 tablet 3  . CARTIA XT 240 MG 24 hr capsule TAKE ONE CAPSULE BY MOUTH DAILY 90 capsule 3  . famotidine (PEPCID) 20 MG tablet Take 1 tablet (20 mg total) by mouth 2 (two) times daily. 180 tablet 1  . gabapentin (NEURONTIN) 100 MG capsule Take 1 capsule (100 mg total) by mouth at bedtime. 90 capsule 3  . ketotifen (ZADITOR) 0.025 % ophthalmic solution Place 1 drop into both eyes daily.     . Lidocaine HCl 4 %  SOLN Apply 4 mLs topically as needed (for migraines).     . Multiple Vitamins-Minerals (DAILY MULTIVITAMIN) CAPS Take 1 capsule by mouth daily.     . nitroGLYCERIN (NITROSTAT) 0.4 MG SL tablet Place 0.4 mg under the tongue every 5 (five) minutes as needed for chest pain.    . pravastatin (PRAVACHOL) 40 MG tablet Take 1 tablet (40 mg total) by mouth daily. Annual appt is due w/labs must see provider for future refills 30 tablet 0  . topiramate (TOPAMAX) 25 MG tablet Take 75 mg by mouth daily.    Marland Kitchen warfarin (COUMADIN) 2 MG tablet TAKE ONE TABLET BY MOUTH DAILY OR AS DIRECTED BY COUMADIN CLINIC 120  tablet 0   No current facility-administered medications on file prior to visit.    Past Medical History:  Diagnosis Date  . Allergic rhinitis   . Anemia   . Arthritis    SHOULDER  . Asthma with bronchitis   . Colon polyp   . COPD (chronic obstructive pulmonary disease) (HCC)    Astmatic bronchitis  . Dysrhythmia 08/2012   NEW ONSET ATRIAL FIBRILATION  . Gout    PMH  . Headache(784.0)   . HLD (hyperlipidemia)   . Nephrolithiasis   . Osteoporosis   . RLS (restless legs syndrome)   . Skin cancer (melanoma) ([place]) 2012   Right neck  . Skin cancer, basal cell    Dr Nevada Crane, [facility]    Past Surgical History:  Procedure Laterality Date  . AORTIC VALVE REPLACEMENT N/A 02/20/2013   Procedure: AORTIC VALVE REPLACEMENT (AVR);  Surgeon: Ivin Poot, MD;  Location: [place];  Service: Open Heart Surgery;  Laterality: N/A;  . COLONOSCOPY W/ POLYPECTOMY     Dr Deatra Ina  . CORONARY ARTERY BYPASS GRAFT N/A 02/20/2013   Procedure: CORONARY ARTERY BYPASS GRAFTING (CABG);  Surgeon: Ivin Poot, MD;  Location: [place];  Service: Open Heart Surgery;  Laterality: N/A;  . FINGER SURGERY     for foreign body  . INGUINAL HERNIA REPAIR    . INTRAOPERATIVE TRANSESOPHAGEAL ECHOCARDIOGRAM N/A 02/20/2013   Procedure: INTRAOPERATIVE TRANSESOPHAGEAL ECHOCARDIOGRAM;  Surgeon: Ivin Poot, MD;  Location: [place];  Service: Open Heart Surgery;  Laterality: N/A;  . LEFT AND RIGHT HEART CATHETERIZATION WITH CORONARY ANGIOGRAM N/A 01/05/2013   Procedure: LEFT AND RIGHT HEART CATHETERIZATION WITH CORONARY ANGIOGRAM;  Surgeon: Blane Ohara, MD;  Location[facility] C[facility] LAB;  Service: Cardiovascular;  Laterality: N/A;  . LEG SURGERY  06/2010    Dr.Lawson, for blood clott. Left leg   . NECK SURGERY  12/2010   Melanoma ; [facility]   . NOSE SURGERY     Septal Deviation  . RIGHT HEART CATHETERIZATION N/A 01/23/2013   Procedure: RIGHT HEART CATH;  Surgeon: Jolaine Artist, MD;  Location: [facility] CATH LAB;  Service:  Cardiovascular;  Laterality: N/A;  . TEE WITHOUT CARDIOVERSION N/A 01/23/2013   Procedure: TRANSESOPHAGEAL ECHOCARDIOGRAM (TEE);  Surgeon: Jolaine Artist, MD;  Location: [facility] ENDOSCOPY;  Service: Cardiovascular;  Laterality: N/A;  sign heart cath consent w/tee consent    Social History   Socioeconomic History  . Marital status: Married    Spouse name: Not on file  . Number of children: Not on file  . Years of education: Not on file  . Highest education level: Not on file  Occupational History  . Occupation: retired    Fish farm manager: [employer]  Tobacco Use  . Smoking status: Former Smoker    Packs/day: 3.00  Years: 15.00    Pack years: 45.00    Types: Cigarettes    Quit date: 02/19/1961    Years since quitting: 58.4  . Smokeless tobacco: Never Used  Vaping Use  . Vaping Use: Never used  Substance and Sexual Activity  . Alcohol use: No  . Drug use: No  . Sexual activity: Not on file  Other Topics Concern  . Not on file  Social History Narrative   Right handed   One story home   No caffeine   Social Determinants of Health   Financial Resource Strain:   . Difficulty of Paying Living Expenses:   Food Insecurity:   . Worried About Charity fundraiser in the Last Year:   . Arboriculturist in the Last Year:   Transportation Needs:   . Film/video editor (Medical):   Marland Kitchen Lack of Transportation (Non-Medical):   Physical Activity:   . Days of Exercise per Week:   . Minutes of Exercise per Session:   Stress:   . Feeling of Stress :   Social Connections:   . Frequency of Communication with Friends and Family:   . Frequency of Social Gatherings with Friends and Family:   . Attends Religious Services:   . Active Member of Clubs or Organizations:   . Attends Archivist Meetings:   Marland Kitchen Marital Status:     Family History  Problem Relation Age of Onset  . Allergies Mother   . Coronary artery disease Mother   . Allergy (severe) Mother   . Allergies Father    . Stroke Father   . Heart attack Father 68  . Asthma Father   . Allergy (severe) Father   . Allergies Sister   . Allergy (severe) Sister   . Allergies Brother        x2  . Allergy (severe) Brother   . Diabetes Brother   . Allergy (severe) Brother   . Emphysema Brother        smoked  . Cancer Son     Review of Systems  Constitutional: Negative for chills and fever.  Respiratory: Negative for cough, shortness of breath and wheezing.   Cardiovascular: Negative for chest pain, palpitations and leg swelling.  Gastrointestinal: Positive for abdominal pain (epigastric pain). Negative for blood in stool and nausea.       GERD  Neurological: Negative for light-headedness and headaches.       Objective:   Vitals:   08/03/19 1357  BP: 138/68  Pulse: 96  Temp: 98.2 F (36.8 C)  SpO2: 97%   BP Readings from Last 3 Encounters:  08/03/19 138/68  01/14/19 132/70  10/20/18 (!) 144/68   Wt Readings from Last 3 Encounters:  08/03/19 140 lb 6.4 oz (63.7 kg)  03/27/19 148 lb (67.1 kg)  01/14/19 146 lb (66.2 kg)   Body mass index is 19.04 kg/m.   Physical Exam    Constitutional: Appears well-developed and well-nourished. No distress.  HENT:  Head: Normocephalic and atraumatic.  Neck: Neck supple. No tracheal deviation present. No thyromegaly present.  No cervical lymphadenopathy Cardiovascular: Normal rate, regular rhythm and normal heart sounds.   2/6 systolic murmur heard. No carotid bruit .  No edema Pulmonary/Chest: Effort normal and breath sounds normal. No respiratory distress. No has no wheezes. No rales. Musculoskeletal: No pain bilateral calfs with palpation. Skin: Skin is warm and dry. Not diaphoretic.  Psychiatric: Normal mood and affect. Behavior is normal.  Assessment & Plan:    See Problem List for Assessment and Plan of chronic medical problems.    This visit occurred during the SARS-CoV-2 public health emergency.  Safety protocols were in place,  including screening questions prior to the visit, additional usage of staff PPE, and extensive cleaning of exam room while observing appropriate contact time as indicated for disinfecting solutions.

## 2019-08-03 ENCOUNTER — Telehealth: Payer: Self-pay | Admitting: Internal Medicine

## 2019-08-03 ENCOUNTER — Encounter: Payer: Self-pay | Admitting: Internal Medicine

## 2019-08-03 ENCOUNTER — Other Ambulatory Visit: Payer: Self-pay

## 2019-08-03 ENCOUNTER — Ambulatory Visit: Payer: Medicare Other | Admitting: *Deleted

## 2019-08-03 ENCOUNTER — Ambulatory Visit: Payer: Medicare Other | Admitting: Internal Medicine

## 2019-08-03 VITALS — BP 138/68 | HR 96 | Temp 98.2°F | Ht 72.0 in | Wt 140.4 lb

## 2019-08-03 DIAGNOSIS — R7303 Prediabetes: Secondary | ICD-10-CM

## 2019-08-03 DIAGNOSIS — K219 Gastro-esophageal reflux disease without esophagitis: Secondary | ICD-10-CM | POA: Diagnosis not present

## 2019-08-03 DIAGNOSIS — F419 Anxiety disorder, unspecified: Secondary | ICD-10-CM

## 2019-08-03 DIAGNOSIS — Z5181 Encounter for therapeutic drug level monitoring: Secondary | ICD-10-CM | POA: Diagnosis not present

## 2019-08-03 DIAGNOSIS — N1831 Chronic kidney disease, stage 3a: Secondary | ICD-10-CM | POA: Diagnosis not present

## 2019-08-03 DIAGNOSIS — G20 Parkinson's disease: Secondary | ICD-10-CM

## 2019-08-03 DIAGNOSIS — E7849 Other hyperlipidemia: Secondary | ICD-10-CM | POA: Diagnosis not present

## 2019-08-03 DIAGNOSIS — Z952 Presence of prosthetic heart valve: Secondary | ICD-10-CM

## 2019-08-03 DIAGNOSIS — I4891 Unspecified atrial fibrillation: Secondary | ICD-10-CM

## 2019-08-03 DIAGNOSIS — M79605 Pain in left leg: Secondary | ICD-10-CM

## 2019-08-03 DIAGNOSIS — M79604 Pain in right leg: Secondary | ICD-10-CM

## 2019-08-03 LAB — LIPID PANEL
Cholesterol: 118 mg/dL (ref 0–200)
HDL: 31.7 mg/dL — ABNORMAL LOW (ref >39.00)
LDL Cholesterol: 63 mg/dL (ref 0–99)
NonHDL: 86.78
Total CHOL/HDL Ratio: 4
Triglycerides: 120 mg/dL (ref 0.0–149.0)
VLDL: 24 mg/dL (ref 0.0–40.0)

## 2019-08-03 LAB — POCT INR: INR: 1.9 — AB (ref 2.0–3.0)

## 2019-08-03 LAB — HEMOGLOBIN A1C: Hgb A1c MFr Bld: 6.5 % (ref 4.6–6.5)

## 2019-08-03 LAB — COMPREHENSIVE METABOLIC PANEL
ALT: 5 U/L (ref 0–53)
AST: 17 U/L (ref 0–37)
Albumin: 4.2 g/dL (ref 3.5–5.2)
Alkaline Phosphatase: 75 U/L (ref 39–117)
BUN: 23 mg/dL (ref 6–23)
CO2: 22 mEq/L (ref 19–32)
Calcium: 9.6 mg/dL (ref 8.4–10.5)
Chloride: 108 mEq/L (ref 96–112)
Creatinine, Ser: 1.47 mg/dL (ref 0.40–1.50)
GFR: 45.29 mL/min — ABNORMAL LOW (ref >60.00)
Glucose, Bld: 113 mg/dL — ABNORMAL HIGH (ref 70–99)
Potassium: 4.1 mEq/L (ref 3.5–5.1)
Sodium: 139 mEq/L (ref 135–145)
Total Bilirubin: 0.4 mg/dL (ref 0.2–1.2)
Total Protein: 8.1 g/dL (ref 6.0–8.3)

## 2019-08-03 MED ORDER — PANTOPRAZOLE SODIUM 20 MG PO TBEC: 20.0000 mg | DELAYED_RELEASE_TABLET | Freq: Every day | ORAL | 5 refills | Status: DC

## 2019-08-03 MED ORDER — PANTOPRAZOLE SODIUM 20 MG PO TBEC: 20.0000 mg | DELAYED_RELEASE_TABLET | Freq: Every day | ORAL | 1 refills | Status: DC

## 2019-08-03 MED ORDER — PRAVASTATIN SODIUM 40 MG PO TABS: 40.0000 mg | ORAL_TABLET | Freq: Every day | ORAL | 1 refills | Status: DC

## 2019-08-03 NOTE — Assessment & Plan Note (Signed)
Management per Dr. Posey Pronto Taking Sinemet Encouraged regular exercise

## 2019-08-03 NOTE — Assessment & Plan Note (Signed)
Chronic Taking BuSpar twice daily Anxiety not always ideally controlled Discussed possibly starting a daily SSRI, but they are both hesitant to do that They will continue to monitor on his current dose of BuSpar If not controlled we can add a low-dose of an SSRI

## 2019-08-03 NOTE — Assessment & Plan Note (Signed)
Chronic CMP 

## 2019-08-03 NOTE — Assessment & Plan Note (Addendum)
Chronic Increased gerd - not on medication now Start pantoprazole 20 mg daily for GERD Advised for them to call if his symptoms do not improve

## 2019-08-03 NOTE — Patient Instructions (Signed)
Description   Today take 1.5 tablets then continue taking 1 tablet daily. Recheck  INR in 6 weeks.  Call us with any medication changes or concerns # [phone] [facility], Main # [phone].

## 2019-08-03 NOTE — Assessment & Plan Note (Signed)
Chronic Check lipid panel, CMP Continue pravastatin at current dose-changed to nighttime Encouraged regular exercise

## 2019-08-03 NOTE — Assessment & Plan Note (Signed)
This is not a new problem-he had this 3 years ago Has seen neurology-possible neuropathy or claudication He thinks riding his bike yesterday may have helped and he will do that more regularly If pain continues needs to see vascular again-possible PAD

## 2019-08-03 NOTE — Assessment & Plan Note (Signed)
Chronic Check a1c Low sugar / carb diet Stressed regular exercise  

## 2019-08-03 NOTE — Telephone Encounter (Signed)
   Can Rx be changed to 90 day supply, pantoprazole (PROTONIX) 20 MG tablet Cheaper cost for patient

## 2019-08-17 ENCOUNTER — Other Ambulatory Visit: Payer: Self-pay | Admitting: Internal Medicine

## 2019-08-18 MED ORDER — DILTIAZEM HCL ER COATED BEADS 240 MG PO CP24: 240.0000 mg | ORAL_CAPSULE | Freq: Every day | ORAL | 0 refills | Status: DC

## 2019-09-03 ENCOUNTER — Telehealth: Payer: Self-pay | Admitting: Internal Medicine

## 2019-09-03 NOTE — Telephone Encounter (Signed)
Insurance has been submitted and verified for Prolia. Patient is responsible for a $240 copay. Due anytime.  Called and spoke to patient's wife. They are going to think about it and call back to schedule.  Okay to schedule... Visit Note: Prolia ($0 copay - okay to give per Gareth Eagle) Visit Type: Nurse Provider: Nurse

## 2019-09-14 ENCOUNTER — Other Ambulatory Visit: Payer: Self-pay

## 2019-09-14 ENCOUNTER — Ambulatory Visit: Payer: Medicare Other | Admitting: *Deleted

## 2019-09-14 DIAGNOSIS — I4891 Unspecified atrial fibrillation: Secondary | ICD-10-CM | POA: Diagnosis not present

## 2019-09-14 DIAGNOSIS — Z5181 Encounter for therapeutic drug level monitoring: Secondary | ICD-10-CM

## 2019-09-14 LAB — POCT INR: INR: 2.4 (ref 2.0–3.0)

## 2019-09-14 NOTE — Patient Instructions (Addendum)
Description   Continue taking 1 tablet daily. Recheck  INR in 6 weeks.  Call us with any medication changes or concerns # [phone] [facility], Main # [phone].

## 2019-09-22 ENCOUNTER — Other Ambulatory Visit: Payer: Self-pay | Admitting: Internal Medicine

## 2019-10-22 NOTE — Progress Notes (Signed)
Cardiology Office Note   Date:  10/23/2019   ID:  LEWAYNE Richard, DOB 1930-08-07, MRN [id number]  PCP:  [name], MD  Cardiologist:   Dorris Carnes, MD   F/U of CAD       History of Present Illness: Robert Richard is a 86 y.o. male with a history ofCAD, s/p CABG x 3  (2015)(LIMA to LAD; SVG to OM1; SVG to PDA) and AVR (#23 mm [facility] Ease pericardial valve. 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  I saw the pt in Aug 2020    Since seen the patients wife says he doesn't want to do anything    The pt denies CP   Breathing is OK   Complains of some pain in legs with activity   No dizziness     Current Meds  Medication Sig  . busPIRone (BUSPAR) 15 MG tablet TAKE ONE TABLET BY MOUTH TWICE A DAY Annual appt is due w/labs must see provider for future refills  . carbidopa-levodopa (SINEMET IR) 25-100 MG tablet Take 1 tablet at 8am, 1pm, and 6pm  . diltiazem (CARTIA XT) 240 MG 24 hr capsule Take 1 capsule (240 mg total) by mouth daily. Please make yearly appt with Dr. Harrington Challenger for August for future refills. 1st attempt  . gabapentin (NEURONTIN) 100 MG capsule Take 1 capsule (100 mg total) by mouth at bedtime.  Marland Kitchen ketotifen (ZADITOR) 0.025 % ophthalmic solution Place 1 drop into both eyes daily.   . Lidocaine HCl 4 % SOLN Apply 4 mLs topically as needed (for migraines).   . Multiple Vitamins-Minerals (DAILY MULTIVITAMIN) CAPS Take 1 capsule by mouth daily.   . nitroGLYCERIN (NITROSTAT) 0.4 MG SL tablet Place 0.4 mg under the tongue every 5 (five) minutes as needed for chest pain.  . pantoprazole (PROTONIX) 20 MG tablet Take 1 tablet (20 mg total) by mouth daily. Take 30 minutes prior to a meal.  For heartburn  . topiramate (TOPAMAX) 25 MG tablet Take 75 mg by mouth daily.  Marland Kitchen warfarin (COUMADIN) 2 MG tablet TAKE ONE (1) TABLET BY MOUTH EVERY  DAY OR AS DIRECTED BY COUMADIN CLINIC  . [DISCONTINUED] pravastatin (PRAVACHOL) 40 MG tablet Take 1 tablet (40 mg total) by mouth at bedtime.     Allergies:   Iodinated diagnostic agents and Ioxaglate   Past Medical History:  Diagnosis Date  . Allergic rhinitis   . Anemia   . Arthritis    SHOULDER  . Asthma with bronchitis   . Colon polyp   . COPD (chronic obstructive pulmonary disease) (HCC)    Astmatic bronchitis  . Dysrhythmia 08/2012   NEW ONSET ATRIAL FIBRILATION  . Gout    PMH  . Headache(784.0)   . HLD (hyperlipidemia)   . Nephrolithiasis   . Osteoporosis   . RLS (restless legs syndrome)   . Skin cancer (melanoma) ([place]) 2012   Right neck  . Skin cancer, basal cell    Dr Nevada Crane, [facility]    Past Surgical History:  Procedure Laterality Date  . AORTIC VALVE REPLACEMENT N/A 02/20/2013   Procedure: AORTIC VALVE REPLACEMENT (AVR);  Surgeon: Ivin Poot, MD;  Location: [place];  Service: Open Heart Surgery;  Laterality: N/A;  . COLONOSCOPY W/ POLYPECTOMY     Dr Deatra Ina  . CORONARY ARTERY BYPASS GRAFT N/A 02/20/2013  Procedure: CORONARY ARTERY BYPASS GRAFTING (CABG);  Surgeon: Ivin Poot, MD;  Location: [place];  Service: Open Heart Surgery;  Laterality: N/A;  . FINGER SURGERY     for foreign body  . INGUINAL HERNIA REPAIR    . INTRAOPERATIVE TRANSESOPHAGEAL ECHOCARDIOGRAM N/A 02/20/2013   Procedure: INTRAOPERATIVE TRANSESOPHAGEAL ECHOCARDIOGRAM;  Surgeon: Ivin Poot, MD;  Location: [place];  Service: Open Heart Surgery;  Laterality: N/A;  . LEFT AND RIGHT HEART CATHETERIZATION WITH CORONARY ANGIOGRAM N/A 01/05/2013   Procedure: LEFT AND RIGHT HEART CATHETERIZATION WITH CORONARY ANGIOGRAM;  Surgeon: Blane Ohara, MD;  Location: [facility] CATH LAB;  Service: Cardiovascular;  Laterality: N/A;  . LEG SURGERY  06/2010    Dr.Lawson, for blood clott. Left leg   . NECK SURGERY  12/2010   Melanoma ; [facility]   . NOSE SURGERY     Septal Deviation  . RIGHT HEART  CATHETERIZATION N/A 01/23/2013   Procedure: RIGHT HEART CATH;  Surgeon: Jolaine Artist, MD;  Location: [facility] CATH LAB;  Service: Cardiovascular;  Laterality: N/A;  . TEE WITHOUT CARDIOVERSION N/A 01/23/2013   Procedure: TRANSESOPHAGEAL ECHOCARDIOGRAM (TEE);  Surgeon: Jolaine Artist, MD;  Location: [facility] ENDOSCOPY;  Service: Cardiovascular;  Laterality: N/A;  sign heart cath consent w/tee consent     Social History:  The patient  reports that he quit smoking about 58 years ago. His smoking use included cigarettes. He has a 45.00 pack-year smoking history. He has never used smokeless tobacco. He reports that he does not drink alcohol and does not use drugs.   Family History:  The patient's family history includes Allergies in his brother, father, mother, and sister; Allergy (severe) in his brother, brother, father, mother, and sister; Asthma in his father; Cancer in his son; Coronary artery disease in his mother; Diabetes in his brother; Emphysema in his brother; Heart attack (age of onset: 30) in his father; Stroke in his father.    ROS:  Please see the history of present illness. All other systems are reviewed and  Negative to the above problem except as noted.    PHYSICAL EXAM: VS:  BP 130/60   Pulse 75   Ht 6' (1.829 m)   Wt 135 lb (61.2 kg)   SpO2 97%   BMI 18.31 kg/m   GEN: Thin 86 yo  in no acute distress  HEENT: normal  Neck: no JVD Cardiac:Irreg Ireg    Gr II/VI systolic murmur LSB  No LE edema  Respiratory:  clear to auscultation bilaterally, normal work of breathing GI: soft, nontender, nondistended, + BS  No hepatomegaly  MS: no deformity Moving all extremities   Skin: warm and dry, no rash Neuro:  Strength and sensation are intact Psych: euthymic mood, full affect   EKG:  EKG is ordered today.    Probable atrial flutter with 2:1 block 75 bpm      Lipid Panel    Component Value Date/Time   CHOL 118 08/03/2019 1445   CHOL 115 10/20/2018 1650   TRIG 120.0 08/03/2019  1445   HDL 31.70 (L) 08/03/2019 1445   HDL 38 (L) 10/20/2018 1650   CHOLHDL 4 08/03/2019 1445   VLDL 24.0 08/03/2019 1445   LDLCALC 63 08/03/2019 1445   LDLCALC 59 10/20/2018 1650      Wt Readings from Last 3 Encounters:  10/23/19 135 lb (61.2 kg)  08/03/19 140 lb 6.4 oz (63.7 kg)  03/27/19 148 lb (67.1 kg)      ASSESSMENT AND  PLAN:  1  CAD  S/p CABG Denies CP   I am not convinced that lack of energy is anginal equivalent     2  Hx AV dz  S/p AVR  Would follow clinically   Last echo in 2020 mean gradient was 17 mm Hg     3  Chronic atrial flutter   Continue coumadin  Check CBC and TSH today    4  HL  With achiness I have asked him to hold pravastatin   Call back in a couple weeks with response     5  HTN Adequate control    F/U in 12 months   Current medicines are reviewed at length with the patient today.  The patient does not have concerns regarding medicines.  Signed, Dorris Carnes, MD  10/23/2019 4:37 PM    [facility] [address], [address], [place]  [address] Phone: [phone]; Fax: [phone]

## 2019-10-23 ENCOUNTER — Ambulatory Visit: Payer: Medicare Other | Admitting: Internal Medicine

## 2019-10-23 ENCOUNTER — Ambulatory Visit: Payer: Medicare Other | Admitting: *Deleted

## 2019-10-23 ENCOUNTER — Encounter: Payer: Self-pay | Admitting: Internal Medicine

## 2019-10-23 ENCOUNTER — Other Ambulatory Visit: Payer: Self-pay

## 2019-10-23 VITALS — BP 130/60 | HR 75 | Ht 72.0 in | Wt 135.0 lb

## 2019-10-23 DIAGNOSIS — I2510 Atherosclerotic heart disease of native coronary artery without angina pectoris: Secondary | ICD-10-CM | POA: Diagnosis not present

## 2019-10-23 DIAGNOSIS — Z5181 Encounter for therapeutic drug level monitoring: Secondary | ICD-10-CM

## 2019-10-23 DIAGNOSIS — I4891 Unspecified atrial fibrillation: Secondary | ICD-10-CM

## 2019-10-23 LAB — POCT INR: INR: 2 (ref 2.0–3.0)

## 2019-10-23 NOTE — Patient Instructions (Signed)
Description   Continue taking 1 tablet daily. Recheck  INR in 7 weeks.  Call us with any medication changes or concerns # [phone] Cou[facility]in Clinic, Main # [phone].

## 2019-10-23 NOTE — Patient Instructions (Signed)
Medication Instructions:  Your physician has recommended you make the following change in your medication:  1.) hold pravastatin to see if achiness improves.  Please call to let Dr. Harrington Challenger know in about 3 weeks.   *If you need a refill on your cardiac medications before your next appointment, please call your pharmacy*   Lab Work: Today: cbc, tsh If you have labs (blood work) drawn today and your tests are completely normal, you will receive your results only by: Marland Kitchen MyChart Message (if you have MyChart) OR . A paper copy in the mail If you have any lab test that is abnormal or we need to change your treatment, we will call you to review the results.   Testing/Procedures: none   Follow-Up: At [facility], you and your health needs are our priority.  As part of our continuing mission to provide you with exceptional heart care, we have created designated Provider Care Teams.  These Care Teams include your primary Cardiologist (physician) and Advanced Practice Providers (APPs -  Physician Assistants and Nurse Practitioners) who all work together to provide you with the care you need, when you need it.   Your next appointment:   7 month(s) (April)  The format for your next appointment:   In Person  Provider:   You may see Dorris Carnes, MD or one of the following Advanced Practice Providers on your designated Care Team:    Richardson Dopp, PA-C  Robbie Lis, [place]    Other Instructions

## 2019-10-24 LAB — BASIC METABOLIC PANEL
BUN/Creatinine Ratio: 12 (ref 10–24)
BUN: 23 mg/dL (ref 8–27)
CO2: 20 mmol/L (ref 20–29)
Calcium: 9.4 mg/dL (ref 8.6–10.2)
Chloride: 106 mmol/L (ref 96–106)
Creatinine, Ser: 1.9 mg/dL — ABNORMAL HIGH (ref 0.76–1.27)
GFR calc Af Amer: 36 mL/min/{1.73_m2} — ABNORMAL LOW (ref >59)
GFR calc non Af Amer: 31 mL/min/{1.73_m2} — ABNORMAL LOW (ref >59)
Glucose: 101 mg/dL — ABNORMAL HIGH (ref 65–99)
Potassium: 4.4 mmol/L (ref 3.5–5.2)
Sodium: 139 mmol/L (ref 134–144)

## 2019-10-24 LAB — CBC
Hematocrit: 31.8 % — ABNORMAL LOW (ref 37.5–51.0)
Hemoglobin: 10.6 g/dL — ABNORMAL LOW (ref 13.0–17.7)
MCH: 29.8 pg (ref 26.6–33.0)
MCHC: 33.3 g/dL (ref 31.5–35.7)
MCV: 89 fL (ref 79–97)
Platelets: 193 10*3/uL (ref 150–450)
RBC: 3.56 x10E6/uL — ABNORMAL LOW (ref 4.14–5.80)
RDW: 12.2 % (ref 11.6–15.4)
WBC: 9.7 10*3/uL (ref 3.4–10.8)

## 2019-10-28 ENCOUNTER — Telehealth: Payer: Self-pay

## 2019-10-28 DIAGNOSIS — D649 Anemia, unspecified: Secondary | ICD-10-CM

## 2019-10-28 DIAGNOSIS — R7989 Other specified abnormal findings of blood chemistry: Secondary | ICD-10-CM

## 2019-10-28 NOTE — Telephone Encounter (Signed)
-----   Message from Dorris Carnes V, MD sent at 10/27/2019 10:23 PM EDT ----- Cr is 1.9   Make sure he is drinking enough during day Hgb is 10.6   Slightly lower than last check I would recheck CBC, BMET and anemia panel in 3 wks

## 2019-10-28 NOTE — Telephone Encounter (Signed)
Patient's wife (DPR) is aware of results. Will send copy to patient's PCP. Patient will come in on 11/18/19 for repeat lab work.

## 2019-11-10 ENCOUNTER — Other Ambulatory Visit: Payer: Self-pay | Admitting: Internal Medicine

## 2019-11-16 ENCOUNTER — Telehealth: Payer: Self-pay | Admitting: Internal Medicine

## 2019-11-16 DIAGNOSIS — I2510 Atherosclerotic heart disease of native coronary artery without angina pectoris: Secondary | ICD-10-CM

## 2019-11-16 NOTE — Telephone Encounter (Signed)
Patient's wife states the patient was taken off pravastatin and they were told to call and update how he is doing. She states his legs are doing better.

## 2019-11-17 NOTE — Telephone Encounter (Signed)
Glad his legs feeling better  Would he be willing to try low dose Crestor 2.5 mg every other day   Metabolism is different than pravachol  May not have same side effect. Follow up lpids in 8 wks with CK and AST

## 2019-11-18 ENCOUNTER — Other Ambulatory Visit: Payer: Medicare Other | Admitting: *Deleted

## 2019-11-18 ENCOUNTER — Other Ambulatory Visit: Payer: Self-pay

## 2019-11-18 DIAGNOSIS — R7989 Other specified abnormal findings of blood chemistry: Secondary | ICD-10-CM

## 2019-11-18 DIAGNOSIS — D649 Anemia, unspecified: Secondary | ICD-10-CM

## 2019-11-19 ENCOUNTER — Telehealth: Payer: Self-pay | Admitting: *Deleted

## 2019-11-19 DIAGNOSIS — D649 Anemia, unspecified: Secondary | ICD-10-CM

## 2019-11-19 LAB — BASIC METABOLIC PANEL
BUN/Creatinine Ratio: 11 (ref 10–24)
BUN: 17 mg/dL (ref 8–27)
CO2: 21 mmol/L (ref 20–29)
Calcium: 9.4 mg/dL (ref 8.6–10.2)
Chloride: 106 mmol/L (ref 96–106)
Creatinine, Ser: 1.54 mg/dL — ABNORMAL HIGH (ref 0.76–1.27)
GFR calc Af Amer: 46 mL/min/{1.73_m2} — ABNORMAL LOW (ref >59)
GFR calc non Af Amer: 40 mL/min/{1.73_m2} — ABNORMAL LOW (ref >59)
Glucose: 97 mg/dL (ref 65–99)
Potassium: 4.4 mmol/L (ref 3.5–5.2)
Sodium: 140 mmol/L (ref 134–144)

## 2019-11-19 LAB — IRON AND TIBC
Iron Saturation: 23 % (ref 15–55)
Iron: 57 ug/dL (ref 38–169)
Total Iron Binding Capacity: 251 ug/dL (ref 250–450)
UIBC: 194 ug/dL (ref 111–343)

## 2019-11-19 LAB — VITAMIN B12: Vitamin B-12: 963 pg/mL (ref 232–1245)

## 2019-11-19 LAB — CBC WITH DIFFERENTIAL/PLATELET
Basophils Absolute: 0 10*3/uL (ref 0.0–0.2)
Basos: 1 %
EOS (ABSOLUTE): 0.1 10*3/uL (ref 0.0–0.4)
Eos: 1 %
Hematocrit: 31.4 % — ABNORMAL LOW (ref 37.5–51.0)
Hemoglobin: 10.3 g/dL — ABNORMAL LOW (ref 13.0–17.7)
Immature Grans (Abs): 0 10*3/uL (ref 0.0–0.1)
Immature Granulocytes: 1 %
Lymphocytes Absolute: 2 10*3/uL (ref 0.7–3.1)
Lymphs: 26 %
MCH: 29.6 pg (ref 26.6–33.0)
MCHC: 32.8 g/dL (ref 31.5–35.7)
MCV: 90 fL (ref 79–97)
Monocytes Absolute: 1.1 10*3/uL — ABNORMAL HIGH (ref 0.1–0.9)
Monocytes: 14 %
Neutrophils Absolute: 4.4 10*3/uL (ref 1.4–7.0)
Neutrophils: 57 %
Platelets: 178 10*3/uL (ref 150–450)
RBC: 3.48 x10E6/uL — ABNORMAL LOW (ref 4.14–5.80)
RDW: 12.5 % (ref 11.6–15.4)
WBC: 7.6 10*3/uL (ref 3.4–10.8)

## 2019-11-19 LAB — FERRITIN: Ferritin: 139 ng/mL (ref 30–400)

## 2019-11-19 LAB — FOLATE: Folate: >20 ng/mL (ref >3.0)

## 2019-11-19 MED ORDER — ROSUVASTATIN CALCIUM 5 MG PO TABS: 2.5000 mg | ORAL_TABLET | ORAL | 3 refills | Status: DC

## 2019-11-19 NOTE — Telephone Encounter (Signed)
DPR ok to s/w pt's wife who has been notified of pt's lab results and recommendations to repeat CBC in about 1 month. I d/w Dr. Alan Ripper nurse Caren Hazy who said to add the CBC on to the lab work to be done 01/28/20. [name] is agreeable to labs 01/28/20 which will need to be fasting as his cholesterol will be checked. [name] thanked me for the call and the help. CBC has been added onto labs 01/28/20. Patient notified of result.  Please refer to phone note from today for complete details.   Julaine Hua, [facility] 11/19/2019 4:37 PM

## 2019-11-19 NOTE — Telephone Encounter (Signed)
-----   Message from Fay Records, MD sent at 11/19/2019 12:38 PM EDT ----- Kidney function is relatively stable with Cr of 1.54 Pt remains mildly anemic   Hgb 10.3   I would check at 1 month (do on day he comes in in Nov for INR) ANemia panel is normal (iron, vitamins)  Keep on same meds

## 2019-11-19 NOTE — Telephone Encounter (Signed)
Informed patient's wife of recommendations. Pt to start rosuvastatin 2.5 mg every other day. She asked about the labs he had done yesterday.  Adv they have not been reviewed by Dr. Harrington Challenger at this time but that they look stable to me.  Adv I would call her after Dr. Harrington Challenger reviews if there are any new recommendations.  She thanked me for the call.

## 2019-12-08 ENCOUNTER — Other Ambulatory Visit: Payer: Self-pay | Admitting: Internal Medicine

## 2019-12-10 ENCOUNTER — Other Ambulatory Visit: Payer: Self-pay

## 2019-12-10 ENCOUNTER — Ambulatory Visit: Payer: Medicare Other

## 2019-12-10 DIAGNOSIS — I4891 Unspecified atrial fibrillation: Secondary | ICD-10-CM

## 2019-12-10 DIAGNOSIS — Z5181 Encounter for therapeutic drug level monitoring: Secondary | ICD-10-CM

## 2019-12-10 DIAGNOSIS — Z952 Presence of prosthetic heart valve: Secondary | ICD-10-CM

## 2019-12-10 LAB — POCT INR: INR: 2.4 (ref 2.0–3.0)

## 2019-12-10 NOTE — Patient Instructions (Signed)
Description   Continue on same dosage 1 tablet daily. Recheck  INR in 8 weeks.  Call us with any medication changes or concerns # [phone] Coumadin Clinic, Main # [phone].

## 2020-01-08 ENCOUNTER — Emergency Department (HOSPITAL_COMMUNITY): Payer: Medicare Other

## 2020-01-08 ENCOUNTER — Other Ambulatory Visit: Payer: Self-pay

## 2020-01-08 ENCOUNTER — Inpatient Hospital Stay (HOSPITAL_COMMUNITY)
Admission: EM | Admit: 2020-01-08 | Discharge: 2020-02-20 | DRG: 956 | Disposition: E | Payer: Medicare Other | Attending: Internal Medicine | Admitting: Internal Medicine

## 2020-01-08 DIAGNOSIS — J189 Pneumonia, unspecified organism: Secondary | ICD-10-CM

## 2020-01-08 DIAGNOSIS — R7989 Other specified abnormal findings of blood chemistry: Secondary | ICD-10-CM | POA: Diagnosis not present

## 2020-01-08 DIAGNOSIS — Z79899 Other long term (current) drug therapy: Secondary | ICD-10-CM

## 2020-01-08 DIAGNOSIS — R0602 Shortness of breath: Secondary | ICD-10-CM

## 2020-01-08 DIAGNOSIS — E1165 Type 2 diabetes mellitus with hyperglycemia: Secondary | ICD-10-CM | POA: Diagnosis present

## 2020-01-08 DIAGNOSIS — N390 Urinary tract infection, site not specified: Secondary | ICD-10-CM | POA: Diagnosis not present

## 2020-01-08 DIAGNOSIS — J690 Pneumonitis due to inhalation of food and vomit: Secondary | ICD-10-CM | POA: Diagnosis not present

## 2020-01-08 DIAGNOSIS — S72144A Nondisplaced intertrochanteric fracture of right femur, initial encounter for closed fracture: Secondary | ICD-10-CM | POA: Diagnosis not present

## 2020-01-08 DIAGNOSIS — E8770 Fluid overload, unspecified: Secondary | ICD-10-CM | POA: Diagnosis not present

## 2020-01-08 DIAGNOSIS — D62 Acute posthemorrhagic anemia: Secondary | ICD-10-CM | POA: Diagnosis not present

## 2020-01-08 DIAGNOSIS — Y92230 Patient room in hospital as the place of occurrence of the external cause: Secondary | ICD-10-CM | POA: Diagnosis not present

## 2020-01-08 DIAGNOSIS — Z833 Family history of diabetes mellitus: Secondary | ICD-10-CM

## 2020-01-08 DIAGNOSIS — Z20822 Contact with and (suspected) exposure to covid-19: Secondary | ICD-10-CM | POA: Diagnosis present

## 2020-01-08 DIAGNOSIS — M810 Age-related osteoporosis without current pathological fracture: Secondary | ICD-10-CM | POA: Diagnosis present

## 2020-01-08 DIAGNOSIS — I4821 Permanent atrial fibrillation: Secondary | ICD-10-CM | POA: Diagnosis present

## 2020-01-08 DIAGNOSIS — D509 Iron deficiency anemia, unspecified: Secondary | ICD-10-CM | POA: Diagnosis present

## 2020-01-08 DIAGNOSIS — E440 Moderate protein-calorie malnutrition: Secondary | ICD-10-CM | POA: Diagnosis present

## 2020-01-08 DIAGNOSIS — R739 Hyperglycemia, unspecified: Secondary | ICD-10-CM

## 2020-01-08 DIAGNOSIS — R52 Pain, unspecified: Secondary | ICD-10-CM

## 2020-01-08 DIAGNOSIS — K219 Gastro-esophageal reflux disease without esophagitis: Secondary | ICD-10-CM | POA: Diagnosis present

## 2020-01-08 DIAGNOSIS — B965 Pseudomonas (aeruginosa) (mallei) (pseudomallei) as the cause of diseases classified elsewhere: Secondary | ICD-10-CM | POA: Diagnosis not present

## 2020-01-08 DIAGNOSIS — Z825 Family history of asthma and other chronic lower respiratory diseases: Secondary | ICD-10-CM

## 2020-01-08 DIAGNOSIS — E119 Type 2 diabetes mellitus without complications: Secondary | ICD-10-CM

## 2020-01-08 DIAGNOSIS — Z681 Body mass index (BMI) 19 or less, adult: Secondary | ICD-10-CM

## 2020-01-08 DIAGNOSIS — G2581 Restless legs syndrome: Secondary | ICD-10-CM | POA: Diagnosis present

## 2020-01-08 DIAGNOSIS — E1122 Type 2 diabetes mellitus with diabetic chronic kidney disease: Secondary | ICD-10-CM | POA: Diagnosis present

## 2020-01-08 DIAGNOSIS — Z823 Family history of stroke: Secondary | ICD-10-CM

## 2020-01-08 DIAGNOSIS — M25551 Pain in right hip: Secondary | ICD-10-CM | POA: Diagnosis not present

## 2020-01-08 DIAGNOSIS — I129 Hypertensive chronic kidney disease with stage 1 through stage 4 chronic kidney disease, or unspecified chronic kidney disease: Secondary | ICD-10-CM | POA: Diagnosis present

## 2020-01-08 DIAGNOSIS — Z91041 Radiographic dye allergy status: Secondary | ICD-10-CM

## 2020-01-08 DIAGNOSIS — W1830XA Fall on same level, unspecified, initial encounter: Secondary | ICD-10-CM | POA: Diagnosis present

## 2020-01-08 DIAGNOSIS — R519 Headache, unspecified: Secondary | ICD-10-CM | POA: Diagnosis present

## 2020-01-08 DIAGNOSIS — Z952 Presence of prosthetic heart valve: Secondary | ICD-10-CM

## 2020-01-08 DIAGNOSIS — R059 Cough, unspecified: Secondary | ICD-10-CM

## 2020-01-08 DIAGNOSIS — J449 Chronic obstructive pulmonary disease, unspecified: Secondary | ICD-10-CM | POA: Diagnosis present

## 2020-01-08 DIAGNOSIS — E785 Hyperlipidemia, unspecified: Secondary | ICD-10-CM | POA: Diagnosis present

## 2020-01-08 DIAGNOSIS — Y92012 Bathroom of single-family (private) house as the place of occurrence of the external cause: Secondary | ICD-10-CM

## 2020-01-08 DIAGNOSIS — W19XXXA Unspecified fall, initial encounter: Secondary | ICD-10-CM

## 2020-01-08 DIAGNOSIS — Z85820 Personal history of malignant melanoma of skin: Secondary | ICD-10-CM

## 2020-01-08 DIAGNOSIS — J029 Acute pharyngitis, unspecified: Secondary | ICD-10-CM | POA: Diagnosis not present

## 2020-01-08 DIAGNOSIS — Z7901 Long term (current) use of anticoagulants: Secondary | ICD-10-CM

## 2020-01-08 DIAGNOSIS — S0083XA Contusion of other part of head, initial encounter: Secondary | ICD-10-CM

## 2020-01-08 DIAGNOSIS — I4891 Unspecified atrial fibrillation: Secondary | ICD-10-CM

## 2020-01-08 DIAGNOSIS — G9341 Metabolic encephalopathy: Secondary | ICD-10-CM | POA: Diagnosis not present

## 2020-01-08 DIAGNOSIS — S36112A Contusion of liver, initial encounter: Secondary | ICD-10-CM | POA: Diagnosis present

## 2020-01-08 DIAGNOSIS — S72001A Fracture of unspecified part of neck of right femur, initial encounter for closed fracture: Secondary | ICD-10-CM | POA: Diagnosis present

## 2020-01-08 DIAGNOSIS — Z66 Do not resuscitate: Secondary | ICD-10-CM | POA: Diagnosis present

## 2020-01-08 DIAGNOSIS — R1311 Dysphagia, oral phase: Secondary | ICD-10-CM | POA: Diagnosis present

## 2020-01-08 DIAGNOSIS — I2510 Atherosclerotic heart disease of native coronary artery without angina pectoris: Secondary | ICD-10-CM | POA: Diagnosis present

## 2020-01-08 DIAGNOSIS — Z8249 Family history of ischemic heart disease and other diseases of the circulatory system: Secondary | ICD-10-CM

## 2020-01-08 DIAGNOSIS — M109 Gout, unspecified: Secondary | ICD-10-CM | POA: Diagnosis present

## 2020-01-08 DIAGNOSIS — R404 Transient alteration of awareness: Secondary | ICD-10-CM | POA: Diagnosis not present

## 2020-01-08 DIAGNOSIS — R64 Cachexia: Secondary | ICD-10-CM | POA: Diagnosis not present

## 2020-01-08 DIAGNOSIS — N189 Chronic kidney disease, unspecified: Secondary | ICD-10-CM | POA: Diagnosis present

## 2020-01-08 DIAGNOSIS — G20 Parkinson's disease: Secondary | ICD-10-CM | POA: Diagnosis present

## 2020-01-08 DIAGNOSIS — M25552 Pain in left hip: Secondary | ICD-10-CM | POA: Diagnosis not present

## 2020-01-08 DIAGNOSIS — J9601 Acute respiratory failure with hypoxia: Secondary | ICD-10-CM | POA: Diagnosis not present

## 2020-01-08 DIAGNOSIS — D72829 Elevated white blood cell count, unspecified: Secondary | ICD-10-CM

## 2020-01-08 DIAGNOSIS — R54 Age-related physical debility: Secondary | ICD-10-CM | POA: Diagnosis present

## 2020-01-08 DIAGNOSIS — R509 Fever, unspecified: Secondary | ICD-10-CM | POA: Diagnosis not present

## 2020-01-08 DIAGNOSIS — S0003XA Contusion of scalp, initial encounter: Secondary | ICD-10-CM | POA: Diagnosis present

## 2020-01-08 DIAGNOSIS — G20A1 Parkinson's disease without dyskinesia, without mention of fluctuations: Secondary | ICD-10-CM

## 2020-01-08 DIAGNOSIS — E860 Dehydration: Secondary | ICD-10-CM | POA: Diagnosis present

## 2020-01-08 DIAGNOSIS — Z8719 Personal history of other diseases of the digestive system: Secondary | ICD-10-CM

## 2020-01-08 DIAGNOSIS — S0181XA Laceration without foreign body of other part of head, initial encounter: Secondary | ICD-10-CM

## 2020-01-08 DIAGNOSIS — E872 Acidosis: Secondary | ICD-10-CM | POA: Diagnosis not present

## 2020-01-08 DIAGNOSIS — M25562 Pain in left knee: Secondary | ICD-10-CM | POA: Diagnosis not present

## 2020-01-08 DIAGNOSIS — Z888 Allergy status to other drugs, medicaments and biological substances status: Secondary | ICD-10-CM

## 2020-01-08 DIAGNOSIS — N1831 Chronic kidney disease, stage 3a: Secondary | ICD-10-CM | POA: Diagnosis present

## 2020-01-08 DIAGNOSIS — F0280 Dementia in other diseases classified elsewhere without behavioral disturbance: Secondary | ICD-10-CM | POA: Diagnosis present

## 2020-01-08 DIAGNOSIS — Y92009 Unspecified place in unspecified non-institutional (private) residence as the place of occurrence of the external cause: Secondary | ICD-10-CM

## 2020-01-08 DIAGNOSIS — M25561 Pain in right knee: Secondary | ICD-10-CM | POA: Diagnosis not present

## 2020-01-08 DIAGNOSIS — Z951 Presence of aortocoronary bypass graft: Secondary | ICD-10-CM

## 2020-01-08 DIAGNOSIS — Z515 Encounter for palliative care: Secondary | ICD-10-CM

## 2020-01-08 DIAGNOSIS — Y939 Activity, unspecified: Secondary | ICD-10-CM

## 2020-01-08 DIAGNOSIS — S72009A Fracture of unspecified part of neck of unspecified femur, initial encounter for closed fracture: Secondary | ICD-10-CM

## 2020-01-08 DIAGNOSIS — Z87891 Personal history of nicotine dependence: Secondary | ICD-10-CM

## 2020-01-08 DIAGNOSIS — S72141A Displaced intertrochanteric fracture of right femur, initial encounter for closed fracture: Secondary | ICD-10-CM

## 2020-01-08 MED ORDER — FENTANYL CITRATE (PF) 100 MCG/2ML IJ SOLN
50.0000 ug | Freq: Once | INTRAMUSCULAR | Status: AC
  Administered 2020-01-08: 50 ug via INTRAVENOUS
  Filled 2020-01-08: qty 2

## 2020-01-08 NOTE — ED Triage Notes (Signed)
Pt from home via [facility] for fall. Pt was in the bathroom when he tripped and fell. C/o right hip pain. Pt also has skin tear on right cheek and hematoma above right brow bone. Alert and oriented. 20g l ac

## 2020-01-09 ENCOUNTER — Inpatient Hospital Stay (HOSPITAL_COMMUNITY): Payer: Medicare Other

## 2020-01-09 ENCOUNTER — Encounter (HOSPITAL_COMMUNITY): Payer: Self-pay | Admitting: Internal Medicine

## 2020-01-09 ENCOUNTER — Emergency Department (HOSPITAL_COMMUNITY): Payer: Medicare Other

## 2020-01-09 ENCOUNTER — Encounter (HOSPITAL_COMMUNITY): Admission: EM | Disposition: E | Payer: Self-pay | Source: Home / Self Care | Attending: Family Medicine

## 2020-01-09 DIAGNOSIS — N390 Urinary tract infection, site not specified: Secondary | ICD-10-CM | POA: Diagnosis not present

## 2020-01-09 DIAGNOSIS — W1830XA Fall on same level, unspecified, initial encounter: Secondary | ICD-10-CM | POA: Diagnosis present

## 2020-01-09 DIAGNOSIS — G9341 Metabolic encephalopathy: Secondary | ICD-10-CM | POA: Diagnosis not present

## 2020-01-09 DIAGNOSIS — D72829 Elevated white blood cell count, unspecified: Secondary | ICD-10-CM | POA: Diagnosis not present

## 2020-01-09 DIAGNOSIS — N1831 Chronic kidney disease, stage 3a: Secondary | ICD-10-CM

## 2020-01-09 DIAGNOSIS — E119 Type 2 diabetes mellitus without complications: Secondary | ICD-10-CM

## 2020-01-09 DIAGNOSIS — J690 Pneumonitis due to inhalation of food and vomit: Secondary | ICD-10-CM | POA: Diagnosis not present

## 2020-01-09 DIAGNOSIS — S72144A Nondisplaced intertrochanteric fracture of right femur, initial encounter for closed fracture: Secondary | ICD-10-CM | POA: Diagnosis present

## 2020-01-09 DIAGNOSIS — S0181XA Laceration without foreign body of other part of head, initial encounter: Secondary | ICD-10-CM

## 2020-01-09 DIAGNOSIS — E440 Moderate protein-calorie malnutrition: Secondary | ICD-10-CM | POA: Diagnosis present

## 2020-01-09 DIAGNOSIS — R64 Cachexia: Secondary | ICD-10-CM | POA: Diagnosis not present

## 2020-01-09 DIAGNOSIS — Y939 Activity, unspecified: Secondary | ICD-10-CM | POA: Diagnosis not present

## 2020-01-09 DIAGNOSIS — Z7901 Long term (current) use of anticoagulants: Secondary | ICD-10-CM | POA: Diagnosis not present

## 2020-01-09 DIAGNOSIS — I4821 Permanent atrial fibrillation: Secondary | ICD-10-CM | POA: Diagnosis present

## 2020-01-09 DIAGNOSIS — G20 Parkinson's disease: Secondary | ICD-10-CM | POA: Diagnosis present

## 2020-01-09 DIAGNOSIS — I2510 Atherosclerotic heart disease of native coronary artery without angina pectoris: Secondary | ICD-10-CM | POA: Diagnosis not present

## 2020-01-09 DIAGNOSIS — S0003XA Contusion of scalp, initial encounter: Secondary | ICD-10-CM | POA: Diagnosis present

## 2020-01-09 DIAGNOSIS — Z515 Encounter for palliative care: Secondary | ICD-10-CM | POA: Diagnosis not present

## 2020-01-09 DIAGNOSIS — E872 Acidosis: Secondary | ICD-10-CM | POA: Diagnosis not present

## 2020-01-09 DIAGNOSIS — B965 Pseudomonas (aeruginosa) (mallei) (pseudomallei) as the cause of diseases classified elsewhere: Secondary | ICD-10-CM | POA: Diagnosis not present

## 2020-01-09 DIAGNOSIS — S72001A Fracture of unspecified part of neck of right femur, initial encounter for closed fracture: Secondary | ICD-10-CM | POA: Diagnosis present

## 2020-01-09 DIAGNOSIS — E785 Hyperlipidemia, unspecified: Secondary | ICD-10-CM | POA: Diagnosis present

## 2020-01-09 DIAGNOSIS — Y92230 Patient room in hospital as the place of occurrence of the external cause: Secondary | ICD-10-CM | POA: Diagnosis not present

## 2020-01-09 DIAGNOSIS — Z66 Do not resuscitate: Secondary | ICD-10-CM | POA: Diagnosis present

## 2020-01-09 DIAGNOSIS — K219 Gastro-esophageal reflux disease without esophagitis: Secondary | ICD-10-CM

## 2020-01-09 DIAGNOSIS — F0280 Dementia in other diseases classified elsewhere without behavioral disturbance: Secondary | ICD-10-CM | POA: Diagnosis present

## 2020-01-09 DIAGNOSIS — N183 Chronic kidney disease, stage 3 unspecified: Secondary | ICD-10-CM | POA: Diagnosis not present

## 2020-01-09 DIAGNOSIS — Y92009 Unspecified place in unspecified non-institutional (private) residence as the place of occurrence of the external cause: Secondary | ICD-10-CM

## 2020-01-09 DIAGNOSIS — W19XXXA Unspecified fall, initial encounter: Secondary | ICD-10-CM

## 2020-01-09 DIAGNOSIS — Z681 Body mass index (BMI) 19 or less, adult: Secondary | ICD-10-CM | POA: Diagnosis not present

## 2020-01-09 DIAGNOSIS — I4891 Unspecified atrial fibrillation: Secondary | ICD-10-CM | POA: Diagnosis not present

## 2020-01-09 DIAGNOSIS — S72141A Displaced intertrochanteric fracture of right femur, initial encounter for closed fracture: Secondary | ICD-10-CM | POA: Diagnosis not present

## 2020-01-09 DIAGNOSIS — S36112A Contusion of liver, initial encounter: Secondary | ICD-10-CM | POA: Diagnosis present

## 2020-01-09 DIAGNOSIS — R739 Hyperglycemia, unspecified: Secondary | ICD-10-CM

## 2020-01-09 DIAGNOSIS — E782 Mixed hyperlipidemia: Secondary | ICD-10-CM

## 2020-01-09 DIAGNOSIS — Y92012 Bathroom of single-family (private) house as the place of occurrence of the external cause: Secondary | ICD-10-CM | POA: Diagnosis not present

## 2020-01-09 DIAGNOSIS — D62 Acute posthemorrhagic anemia: Secondary | ICD-10-CM | POA: Diagnosis not present

## 2020-01-09 DIAGNOSIS — Z20822 Contact with and (suspected) exposure to covid-19: Secondary | ICD-10-CM | POA: Diagnosis present

## 2020-01-09 DIAGNOSIS — J9601 Acute respiratory failure with hypoxia: Secondary | ICD-10-CM | POA: Diagnosis not present

## 2020-01-09 LAB — URINALYSIS, ROUTINE W REFLEX MICROSCOPIC
Bacteria, UA: NONE SEEN
Bilirubin Urine: NEGATIVE
Glucose, UA: NEGATIVE mg/dL
Ketones, ur: NEGATIVE mg/dL
Leukocytes,Ua: NEGATIVE
Nitrite: NEGATIVE
Protein, ur: NEGATIVE mg/dL
RBC / HPF: >50 RBC/hpf — ABNORMAL HIGH (ref 0–5)
Specific Gravity, Urine: 1.017 (ref 1.005–1.030)
pH: 6 (ref 5.0–8.0)

## 2020-01-09 LAB — CBC WITH DIFFERENTIAL/PLATELET
Abs Immature Granulocytes: 0.12 10*3/uL — ABNORMAL HIGH (ref 0.00–0.07)
Basophils Absolute: 0 10*3/uL (ref 0.0–0.1)
Basophils Relative: 0 %
Eosinophils Absolute: 0 10*3/uL (ref 0.0–0.5)
Eosinophils Relative: 0 %
HCT: 30.5 % — ABNORMAL LOW (ref 39.0–52.0)
Hemoglobin: 9.6 g/dL — ABNORMAL LOW (ref 13.0–17.0)
Immature Granulocytes: 1 %
Lymphocytes Relative: 7 %
Lymphs Abs: 1.1 10*3/uL (ref 0.7–4.0)
MCH: 29.9 pg (ref 26.0–34.0)
MCHC: 31.5 g/dL (ref 30.0–36.0)
MCV: 95 fL (ref 80.0–100.0)
Monocytes Absolute: 1.2 10*3/uL — ABNORMAL HIGH (ref 0.1–1.0)
Monocytes Relative: 7 %
Neutro Abs: 13.9 10*3/uL — ABNORMAL HIGH (ref 1.7–7.7)
Neutrophils Relative %: 85 %
Platelets: 194 10*3/uL (ref 150–400)
RBC: 3.21 MIL/uL — ABNORMAL LOW (ref 4.22–5.81)
RDW: 13.2 % (ref 11.5–15.5)
WBC: 16.4 10*3/uL — ABNORMAL HIGH (ref 4.0–10.5)
nRBC: 0 % (ref 0.0–0.2)

## 2020-01-09 LAB — COMPREHENSIVE METABOLIC PANEL
ALT: 13 U/L (ref 0–44)
AST: 20 U/L (ref 15–41)
Albumin: 3.5 g/dL (ref 3.5–5.0)
Alkaline Phosphatase: 82 U/L (ref 38–126)
Anion gap: 8 (ref 5–15)
BUN: 20 mg/dL (ref 8–23)
CO2: 21 mmol/L — ABNORMAL LOW (ref 22–32)
Calcium: 8.9 mg/dL (ref 8.9–10.3)
Chloride: 108 mmol/L (ref 98–111)
Creatinine, Ser: 1.44 mg/dL — ABNORMAL HIGH (ref 0.61–1.24)
GFR, Estimated: 47 mL/min — ABNORMAL LOW (ref >60)
Glucose, Bld: 162 mg/dL — ABNORMAL HIGH (ref 70–99)
Potassium: 4.5 mmol/L (ref 3.5–5.1)
Sodium: 137 mmol/L (ref 135–145)
Total Bilirubin: 0.6 mg/dL (ref 0.3–1.2)
Total Protein: 7.3 g/dL (ref 6.5–8.1)

## 2020-01-09 LAB — PROTIME-INR
INR: 2.7 — ABNORMAL HIGH (ref 0.8–1.2)
Prothrombin Time: 27.4 seconds — ABNORMAL HIGH (ref 11.4–15.2)

## 2020-01-09 LAB — RESP PANEL BY RT-PCR (FLU A&B, COVID) ARPGX2
Influenza A by PCR: NEGATIVE
Influenza B by PCR: NEGATIVE
SARS Coronavirus 2 by RT PCR: NEGATIVE

## 2020-01-09 LAB — SURGICAL PCR SCREEN
MRSA, PCR: NEGATIVE
Staphylococcus aureus: NEGATIVE

## 2020-01-09 SURGERY — FIXATION, FRACTURE, INTERTROCHANTERIC, WITH INTRAMEDULLARY ROD
Anesthesia: Choice | Laterality: Right

## 2020-01-09 MED ORDER — CHLORHEXIDINE GLUCONATE CLOTH 2 % EX PADS
6.0000 | MEDICATED_PAD | Freq: Every day | CUTANEOUS | Status: DC
  Administered 2020-01-11: 6 via TOPICAL

## 2020-01-09 MED ORDER — PHYTONADIONE 5 MG PO TABS
10.0000 mg | ORAL_TABLET | Freq: Once | ORAL | Status: AC
  Administered 2020-01-09: 10 mg via ORAL
  Filled 2020-01-09: qty 2

## 2020-01-09 MED ORDER — DILTIAZEM HCL ER COATED BEADS 240 MG PO CP24
240.0000 mg | ORAL_CAPSULE | Freq: Every day | ORAL | Status: DC
  Administered 2020-01-09: 240 mg via ORAL
  Filled 2020-01-09: qty 1

## 2020-01-09 MED ORDER — PANTOPRAZOLE SODIUM 40 MG PO TBEC
40.0000 mg | DELAYED_RELEASE_TABLET | Freq: Every day | ORAL | Status: DC
  Administered 2020-01-09 – 2020-01-15 (×6): 40 mg via ORAL
  Filled 2020-01-09 (×6): qty 1

## 2020-01-09 MED ORDER — TOPIRAMATE 25 MG PO TABS
75.0000 mg | ORAL_TABLET | Freq: Every day | ORAL | Status: DC
  Administered 2020-01-09 – 2020-01-16 (×7): 75 mg via ORAL
  Filled 2020-01-09 (×7): qty 3

## 2020-01-09 MED ORDER — KETOTIFEN FUMARATE 0.025 % OP SOLN
1.0000 [drp] | Freq: Every day | OPHTHALMIC | Status: DC
  Administered 2020-01-12 – 2020-01-24 (×13): 1 [drp] via OPHTHALMIC
  Filled 2020-01-09 (×2): qty 5

## 2020-01-09 MED ORDER — ROSUVASTATIN CALCIUM 5 MG PO TABS
2.5000 mg | ORAL_TABLET | ORAL | Status: DC
  Administered 2020-01-09 – 2020-01-23 (×9): 2.5 mg via ORAL
  Filled 2020-01-09 (×8): qty 1
  Filled 2020-01-09: qty 0.5
  Filled 2020-01-09: qty 1
  Filled 2020-01-09: qty 0.5

## 2020-01-09 MED ORDER — ADULT MULTIVITAMIN W/MINERALS CH
ORAL_TABLET | Freq: Every day | ORAL | Status: DC
  Administered 2020-01-11 – 2020-01-23 (×13): 1 via ORAL
  Filled 2020-01-09 (×13): qty 1

## 2020-01-09 MED ORDER — CARBIDOPA-LEVODOPA 25-100 MG PO TABS
1.0000 | ORAL_TABLET | Freq: Three times a day (TID) | ORAL | Status: DC
  Administered 2020-01-09 – 2020-01-23 (×37): 1 via ORAL
  Filled 2020-01-09 (×43): qty 1

## 2020-01-09 MED ORDER — GABAPENTIN 100 MG PO CAPS
100.0000 mg | ORAL_CAPSULE | Freq: Every day | ORAL | Status: DC
  Administered 2020-01-09 – 2020-01-14 (×4): 100 mg via ORAL
  Filled 2020-01-09 (×5): qty 1

## 2020-01-09 MED ORDER — MORPHINE SULFATE (PF) 2 MG/ML IV SOLN
2.0000 mg | INTRAVENOUS | Status: DC | PRN
  Administered 2020-01-09 – 2020-01-24 (×10): 2 mg via INTRAVENOUS
  Filled 2020-01-09 (×10): qty 1

## 2020-01-09 NOTE — ED Notes (Signed)
Tolerated 75% of meal.  Wife at bedside.

## 2020-01-09 NOTE — ED Notes (Signed)
Patient transported to X-ray 

## 2020-01-09 NOTE — ED Notes (Signed)
ED Provider at bedside. 

## 2020-01-09 NOTE — Progress Notes (Signed)
Patient is s/p fall at home and presented to [facility] ED with comminuted R IT femur fx. Patient will require surgical stabilization of his fracture. Family requested [facility]. EDP has consulted [facility] for admission at [facility]. Patient takes coumadin and INR is 2.7 today.  Please hold coumadin. I have ordered PO vitamin K. I ordered R knee x-rays as well. Plan for surgery when INR acceptable, possibly tomorrow. NPO after MN tonight.

## 2020-01-09 NOTE — ED Notes (Signed)
Update given to wife

## 2020-01-09 NOTE — H&P (Signed)
History and Physical  Robert Richard:[id number] DOB: [birth year] DOA: 12/29/2019  Referring physician: Rolland Porter, MD PCP: [name], MD  Patient coming from: Home  Chief Complaint: Fall, right hip pain  HPI: Robert Richard is a 86 y.o. male with medical history significant for hyperlipidemia, CAD, A. fib on warfarin, GERD, CKD stage III, Parkinson's disease who presents to the emergency department via EMS after sustaining a fall at home.  History was obtained by wife at bedside, per wife, patient got out of shower and while he was still in the bathroom, he fell (patient states he does not know why) and landed on the right side of his face and right hip and he sustained facial laceration with scalp contusion, patient also complained of right hip pain with difficulty in being able to bear weight on right leg due to pain.  EMS was activated, and patient was taken to the ED for further evaluation and management.  ED Course:  In the emergency department, he was hemodynamically stable, though BP was soft at 102/52.  Work-up in the ED showed leukocytosis and normocytic anemia.  Creatinine was 1.44 blood glucose level was 162.  CT maxillofacial, CT cervical spine without contrast showed no acute intracranial abnormality but showed right frontal/supraorbital swelling and laceration extending to the temporal soft tissues, mild right malar contusion and laceration noted but no acute facial bone fracture was seen.  IV fentanyl 50 MCG was given.  Wife preferred for patient to be transferred to [facility] since patient is seen in orthopedist in [place] few years ago.  Orthopedic surgeon on call (Dr. Rolena Infante) was consulted by ED physician and recommended that patient can be transferred to [facility] for a probable surgery tomorrow (11/21)  Review of Systems: Constitutional: Negative for chills and fever.  HENT: Positive for right facial laceration.  Negative for ear pain and sore throat.   Eyes:  Negative for pain and visual disturbance.  Respiratory: Negative for cough, chest tightness and shortness of breath.   Cardiovascular: Negative for chest pain and palpitations.  Gastrointestinal: Negative for abdominal pain and vomiting.  Endocrine: Negative for polyphagia and polyuria.  Genitourinary: Negative for decreased urine volume, dysuria, enuresis Musculoskeletal: Positive for right hip pain.  Negative for arthralgias and back pain.  Skin: Negative for color change and rash.  Allergic/Immunologic: Negative for immunocompromised state.  Neurological: Negative for syncope, speech difficulty, weakness, light-headedness and headaches.  Hematological: Does not bruise/bleed easily.  All other systems reviewed and are negative   Past Medical History:  Diagnosis Date  . Allergic rhinitis   . Anemia   . Arthritis    SHOULDER  . Asthma with bronchitis   . Colon polyp   . COPD (chronic obstructive pulmonary disease) (HCC)    Astmatic bronchitis  . Dysrhythmia 08/2012   NEW ONSET ATRIAL FIBRILATION  . Gout    PMH  . Headache(784.0)   . HLD (hyperlipidemia)   . Nephrolithiasis   . Osteoporosis   . RLS (restless legs syndrome)   . Skin cancer (melanoma) ([place]) 2012   Right neck  . Skin cancer, basal cell    Dr Nevada Crane, [facility]   Past Surgical History:  Procedure Laterality Date  . AORTIC VALVE REPLACEMENT N/A 02/20/2013   Procedure: AORTIC VALVE REPLACEMENT (AVR);  Surgeon: Ivin Poot, MD;  Location: [facility];  Service: Open Heart Surgery;  Laterality: N/A;  . COLONOSCOPY W/ POLYPECTOMY     Dr Deatra Ina  . CORONARY ARTERY BYPASS  GRAFT N/A 02/20/2013   Procedure: CORONARY ARTERY BYPASS GRAFTING (CABG);  Surgeon: Ivin Poot, MD;  Location: [place];  Service: Open Heart Surgery;  Laterality: N/A;  . FINGER SURGERY     for foreign body  . INGUINAL HERNIA REPAIR    . INTRAOPERATIVE TRANSESOPHAGEAL ECHOCARDIOGRAM N/A 02/20/2013   Procedure: INTRAOPERATIVE TRANSESOPHAGEAL ECHOCARDIOGRAM;   Surgeon: Ivin Poot, MD;  Location: [facility];  Service: Open Heart Surgery;  Laterality: N/A;  . LEFT AND RIGHT HEART CATHETERIZATION WITH CORONARY ANGIOGRAM N/A 01/05/2013   Procedure: LEFT AND RIGHT HEART CATHETERIZATION WITH CORONARY ANGIOGRAM;  Surgeon: Blane Ohara, MD;  Location: [facility] CATH LAB;  Service: Cardiovascular;  Laterality: N/A;  . LEG SURGERY  06/2010    Dr.Lawson, for blood clott. Left leg   . NECK SURGERY  12/2010   Melanoma ; [facility]   . NOSE SURGERY     Septal Deviation  . RIGHT HEART CATHETERIZATION N/A 01/23/2013   Procedure: RIGHT HEART CATH;  Surgeon: Jolaine Artist, MD;  Location: [facility] CATH LAB;  Service: Cardiovascular;  Laterality: N/A;  . TEE WITHOUT CARDIOVERSION N/A 01/23/2013   Procedure: TRANSESOPHAGEAL ECHOCARDIOGRAM (TEE);  Surgeon: Jolaine Artist, MD;  Location: [facility] ENDOSCOPY;  Service: Cardiovascular;  Laterality: N/A;  sign heart cath consent w/tee consent    Social History:  reports that he quit smoking about 58 years ago. His smoking use included cigarettes. He has a 45.00 pack-year smoking history. He has never used smokeless tobacco. He reports that he does not drink alcohol and does not use drugs.   Allergies  Allergen Reactions  . Iodinated Diagnostic Agents Hives    Other reaction(s): RASH Other reaction(s): RASH  . Ioxaglate Hives    Other reaction(s): RASH    Family History  Problem Relation Age of Onset  . Allergies Mother   . Coronary artery disease Mother   . Allergy (severe) Mother   . Allergies Father   . Stroke Father   . Heart attack Father 11  . Asthma Father   . Allergy (severe) Father   . Allergies Sister   . Allergy (severe) Sister   . Allergies Brother        x2  . Allergy (severe) Brother   . Diabetes Brother   . Allergy (severe) Brother   . Emphysema Brother        smoked  . Cancer Son      Prior to Admission medications   Medication Sig Start Date End Date Taking? Authorizing Provider   busPIRone (BUSPAR) 15 MG tablet TAKE ONE TABLET BY MOUTH TWICE A DAY Annual appt is due w/labs must see provider for future refills 06/16/19   [name], MD  carbidopa-levodopa (SINEMET IR) 25-100 MG tablet Take 1 tablet at 8am, 1pm, and 6pm 03/30/19   Narda Amber K, DO  diltiazem (CARDIZEM CD) 240 MG 24 hr capsule TAKE ONE CAPSULE (240MG  TOTAL) BY MOUTH DAILY 11/10/19   Fay Records, MD  gabapentin (NEURONTIN) 100 MG capsule Take 1 capsule (100 mg total) by mouth at bedtime. 03/30/19   Narda Amber K, DO  ketotifen (ZADITOR) 0.025 % ophthalmic solution Place 1 drop into both eyes daily.     [provider]  Lidocaine HCl 4 % SOLN Apply 4 mLs topically as needed (for migraines).     [provider]  Multiple Vitamins-Minerals (DAILY MULTIVITAMIN) CAPS Take 1 capsule by mouth daily.  12/21/10   [provider]  nitroGLYCERIN (NITROSTAT) 0.4  MG SL tablet Place 0.4 mg under the tongue every 5 (five) minutes as needed for chest pain.    [provider]  pantoprazole (PROTONIX) 20 MG tablet Take 1 tablet (20 mg total) by mouth daily. Take 30 minutes prior to a meal.  For heartburn 08/03/19   [name], MD  rosuvastatin (CRESTOR) 5 MG tablet Take 0.5 tablets (2.5 mg total) by mouth every other day. 11/19/19   Fay Records, MD  topiramate (TOPAMAX) 25 MG tablet Take 75 mg by mouth daily.    [provider]  warfarin (JANTOVEN) 2 MG tablet TAKE ONE (1) TABLET BY MOUTH EVERY DAY OR AS DIRECTED BY COUMADIN CLINIC 12/08/19   Fay Records, MD    Physical Exam: BP (!) 117/55   Pulse 100   Temp 98.1 F (36.7 C)   Resp (!) 25   Ht 6\' 2"  (1.88 m)   Wt 65.8 kg   SpO2 96%   BMI 18.62 kg/m   . General: 86 y.o. year-old male cachectic, frail, but in no acute distress.  Alert and oriented x3. Marland Kitchen HEENT: Right forehead hematoma with skin tear in the cheek area.  EOMI . Cardiovascular: Irregularly irregular rate and rhythm with no rubs or gallops.  No  thyromegaly or JVD noted.  No lower extremity edema. 2/4 pulses in all 4 extremities. Marland Kitchen Respiratory: Clear to auscultation with no wheezes or rales. Good inspiratory effort. . Abdomen: Soft nontender nondistended with normal bowel sounds x4 quadrants. . Muskuloskeletal: Patient lying in left lateral cubitus position with restricted movement of RLE.  No cyanosis, clubbing or edema noted bilaterally . Neuro: CN II-XII intact, strength, sensation, reflexes . Skin: No ulcerative lesions noted or rashes . Psychiatry: Judgement and insight appear normal. Mood is appropriate for condition and setting          Labs on Admission:  Basic Metabolic Panel: Recent Labs  Lab 12/23/2019 0053  NA 137  K 4.5  CL 108  CO2 21*  GLUCOSE 162*  BUN 20  CREATININE 1.44*  CALCIUM 8.9   Liver Function Tests: Recent Labs  Lab 01/05/2020 0053  AST 20  ALT 13  ALKPHOS 82  BILITOT 0.6  PROT 7.3  ALBUMIN 3.5   No results for input(s): LIPASE, AMYLASE in the last 168 hours. No results for input(s): AMMONIA in the last 168 hours. CBC: Recent Labs  Lab 12/26/2019 0053  WBC 16.4*  NEUTROABS 13.9*  HGB 9.6*  HCT 30.5*  MCV 95.0  PLT 194   Cardiac Enzymes: No results for input(s): CKTOTAL, CKMB, CKMBINDEX, TROPONINI in the last 168 hours.  BNP (last 3 results) No results for input(s): BNP in the last 8760 hours.  ProBNP (last 3 results) No results for input(s): PROBNP in the last 8760 hours.  CBG: No results for input(s): GLUCAP in the last 168 hours.  Radiological Exams on Admission: DG Chest 1 View  Result Date: 12/26/2019 CLINICAL DATA:  Initial evaluation for preoperative evaluation, hip fracture. EXAM: CHEST  1 VIEW COMPARISON:  Prior radiograph from 10/15/2017 FINDINGS: Median sternotomy wires underlying CABG markers and valvular prosthesis noted. Mild exaggeration of the cardiac silhouette related AP technique. Mediastinal silhouette normal. Aortic atherosclerosis. Lungs mildly  hypoinflated. Associated mild patchy left basilar opacity, likely atelectasis. No focal infiltrates. Mild perihilar vascular congestion without overt pulmonary edema. No pleural effusion. No pneumothorax. No acute osseous finding.  Osteopenia. IMPRESSION: 1. Hypoinflation with mild patchy left basilar opacity, likely atelectasis. 2. Sequelae of prior  CABG with valvular prosthesis in place. 3.  Aortic Atherosclerosis (ICD10-I70.0). Electronically Signed   By: Jeannine Boga M.D.   On: 01/07/2020 02:12   CT Head Wo Contrast  Result Date: 01/19/2020 CLINICAL DATA:  Fall, right cheek skin tear and right supraorbital hematoma EXAM: CT HEAD WITHOUT CONTRAST CT MAXILLOFACIAL WITHOUT CONTRAST CT CERVICAL SPINE WITHOUT CONTRAST TECHNIQUE: Multidetector CT imaging of the head, cervical spine, and maxillofacial structures were performed using the standard protocol without intravenous contrast. Multiplanar CT image reconstructions of the cervical spine and maxillofacial structures were also generated. COMPARISON:  CT head 12/17/2014, MRI 12/12/2009, paranasal sinus CT 11/04/2009 FINDINGS: CT HEAD FINDINGS Brain: No evidence of acute infarction, hemorrhage, hydrocephalus, extra-axial collection, visible mass lesion or mass effect. Diffuse prominence of the cisterns and sulci compatible with parenchymal volume loss. Ventricular dilatation is sat Lea out of proportion to the degree of sulcal prominence with slight narrowing of the callososeptal angle and upward bowing of the corpus callosum. Patchy areas of white matter hypoattenuation are most compatible with chronic microvascular angiopathy. Midline structures are normal. Cerebellar tonsils are normally position. Vascular: Atherosclerotic calcification of the carotid siphons and intradural vertebral arteries. No hyperdense vessel. Skull: Right frontal/supraorbital swelling and laceration extending to the temporal soft tissues as well. Thin crescentic scalp hematoma  measuring up to 6 mm maximal thickness. No subjacent calvarial fracture or other acute osseous injury of the calvaria. Other: None. CT MAXILLOFACIAL FINDINGS Osseous: No fracture of the bony orbits. Chronic appearing deformities of the nasal bones similar to comparison imaging in 2011. No other mid face fractures are seen. The pterygoid plates are intact. No visible or suspected temporal bone fractures. Temporomandibular joints are normally aligned. The mandible is intact. Numerous dental amalgams and metallic orthotic hardware limit evaluation of the dentition. No clearly fractured or avulsed teeth. Orbits: Some slightly asymmetric right supraorbital swelling and palpebral thickening. No retro septal gas, stranding or hemorrhage. Bilateral lens extractions. Small amount of right sub palpebral gas is a normal finding. Sinuses: Postsurgical changes from prior antrectomies and partial ethmoidectomies. Some pneumatized secretions and thickening are present within the posterior ethmoids on the left with slightly hyperostotic changes likely reflecting an acute on chronic sinusitis. Remaining paranasal sinuses and mastoid air cells are predominantly clear. Middle ear cavities are clear. Ossicular chains are normally configured. Soft tissues: Right periorbital and supraorbital soft tissue swelling. Mild right malar contusion and laceration as well. Question some mild pre mental thickening. Soft tissues are otherwise unremarkable without further gas or foreign body. CT CERVICAL SPINE FINDINGS Alignment: Stabilization collar is absent at the time of examination slightly exaggerated cervical lordosis. Minimal anterolisthesis C2 on 3 and retrolisthesis C3 on 4 is favored to be on a degenerative basis with spondylitic changes at these levels. No evidence of traumatic listhesis. No abnormally widened, perched or jumped facets. Normal alignment of the craniocervical articulations accounting for mild rightward cranial rotation.  Effacement of the atlantodental interval likely related to advanced arthrosis. Skull base and vertebrae: The osseous structures appear diffusely demineralized which may limit detection of small or nondisplaced fractures. No visible skull base fracture is seen. No discernible fracture of the vertebral bodies or posterior elements. No worrisome lytic or blastic lesions. Multilevel cervical spondylitic changes as below. Advanced arthrosis at the atlantodental and basion dens interval including a likely degenerative os odontoideum. Soft tissues and spinal canal: No pre or paravertebral fluid or swelling. No visible canal hematoma. Some minimal enthesopathic mineralization in the nuchal ligament. Disc levels: Multilevel cervical spondylitic  changes are present with diffuse intervertebral disc height loss and posterior ridging with disc osteophyte complexes. Larger complexes at C4-5 and C5-6 result in at most mild canal stenosis with only partial effacement of ventral thecal sac at the remaining cervical levels. Diffuse uncinate spurring and facet hypertrophic changes result in mild-to-moderate multilevel neural foraminal narrowing without severe foraminal impingement in the cervical spine. Upper chest: Some apical emphysematous changes and biapical pleuroparenchymal scarring. Calcification of the proximal great vessels. Cervical carotid atherosclerosis is noted. Other: No concerning thyroid nodules or masses. Diminutive appearance of the thyroid. IMPRESSION: 1. Right frontal/supraorbital swelling and laceration extending to the temporal soft tissues as well. Thin crescentic scalp hematoma measuring up to 6 mm maximal thickness. No subjacent calvarial fracture or other acute osseous injury. 2. No acute intracranial abnormality. 3. Background of chronic microvascular angiopathy and parenchymal volume loss. Ventricular dilatation is slightly out of proportion to the degree of sulcal prominence with narrowing of the  callososeptal angle and upward bowing of the corpus callosum. Findings could suggest a normal pressure hydrocephalus though are similar to comparison imaging. Correlate with clinical symptoms. 4. Mild right malar contusion and laceration. 5. No acute facial bone fracture. 6. Chronic appearing deformities of the nasal bones. 7. Postsurgical changes prior sinus surgery. Residual pneumatized secretions and thickening within the posterior ethmoids on the left with slightly hyperostotic changes likely reflecting an acute on chronic sinusitis. 8. No evidence of acute fracture or traumatic listhesis of the cervical spine. 9. Diffuse cervical spondylitic changes with multifactorial mild canal stenosis and mild-to-moderate multilevel foraminal narrowing as described above. 10. Cervical and intracranial atherosclerosis. 11.  Emphysema (ICD10-J43.9). Electronically Signed   By: Lovena Le M.D.   On: 01/16/2020 04:19   CT Cervical Spine Wo Contrast  Result Date: 01/07/2020 CLINICAL DATA:  Fall, right cheek skin tear and right supraorbital hematoma EXAM: CT HEAD WITHOUT CONTRAST CT MAXILLOFACIAL WITHOUT CONTRAST CT CERVICAL SPINE WITHOUT CONTRAST TECHNIQUE: Multidetector CT imaging of the head, cervical spine, and maxillofacial structures were performed using the standard protocol without intravenous contrast. Multiplanar CT image reconstructions of the cervical spine and maxillofacial structures were also generated. COMPARISON:  CT head 12/17/2014, MRI 12/12/2009, paranasal sinus CT 11/04/2009 FINDINGS: CT HEAD FINDINGS Brain: No evidence of acute infarction, hemorrhage, hydrocephalus, extra-axial collection, visible mass lesion or mass effect. Diffuse prominence of the cisterns and sulci compatible with parenchymal volume loss. Ventricular dilatation is sat Lea out of proportion to the degree of sulcal prominence with slight narrowing of the callososeptal angle and upward bowing of the corpus callosum. Patchy areas of  white matter hypoattenuation are most compatible with chronic microvascular angiopathy. Midline structures are normal. Cerebellar tonsils are normally position. Vascular: Atherosclerotic calcification of the carotid siphons and intradural vertebral arteries. No hyperdense vessel. Skull: Right frontal/supraorbital swelling and laceration extending to the temporal soft tissues as well. Thin crescentic scalp hematoma measuring up to 6 mm maximal thickness. No subjacent calvarial fracture or other acute osseous injury of the calvaria. Other: None. CT MAXILLOFACIAL FINDINGS Osseous: No fracture of the bony orbits. Chronic appearing deformities of the nasal bones similar to comparison imaging in 2011. No other mid face fractures are seen. The pterygoid plates are intact. No visible or suspected temporal bone fractures. Temporomandibular joints are normally aligned. The mandible is intact. Numerous dental amalgams and metallic orthotic hardware limit evaluation of the dentition. No clearly fractured or avulsed teeth. Orbits: Some slightly asymmetric right supraorbital swelling and palpebral thickening. No retro septal gas, stranding or hemorrhage.  Bilateral lens extractions. Small amount of right sub palpebral gas is a normal finding. Sinuses: Postsurgical changes from prior antrectomies and partial ethmoidectomies. Some pneumatized secretions and thickening are present within the posterior ethmoids on the left with slightly hyperostotic changes likely reflecting an acute on chronic sinusitis. Remaining paranasal sinuses and mastoid air cells are predominantly clear. Middle ear cavities are clear. Ossicular chains are normally configured. Soft tissues: Right periorbital and supraorbital soft tissue swelling. Mild right malar contusion and laceration as well. Question some mild pre mental thickening. Soft tissues are otherwise unremarkable without further gas or foreign body. CT CERVICAL SPINE FINDINGS Alignment:  Stabilization collar is absent at the time of examination slightly exaggerated cervical lordosis. Minimal anterolisthesis C2 on 3 and retrolisthesis C3 on 4 is favored to be on a degenerative basis with spondylitic changes at these levels. No evidence of traumatic listhesis. No abnormally widened, perched or jumped facets. Normal alignment of the craniocervical articulations accounting for mild rightward cranial rotation. Effacement of the atlantodental interval likely related to advanced arthrosis. Skull base and vertebrae: The osseous structures appear diffusely demineralized which may limit detection of small or nondisplaced fractures. No visible skull base fracture is seen. No discernible fracture of the vertebral bodies or posterior elements. No worrisome lytic or blastic lesions. Multilevel cervical spondylitic changes as below. Advanced arthrosis at the atlantodental and basion dens interval including a likely degenerative os odontoideum. Soft tissues and spinal canal: No pre or paravertebral fluid or swelling. No visible canal hematoma. Some minimal enthesopathic mineralization in the nuchal ligament. Disc levels: Multilevel cervical spondylitic changes are present with diffuse intervertebral disc height loss and posterior ridging with disc osteophyte complexes. Larger complexes at C4-5 and C5-6 result in at most mild canal stenosis with only partial effacement of ventral thecal sac at the remaining cervical levels. Diffuse uncinate spurring and facet hypertrophic changes result in mild-to-moderate multilevel neural foraminal narrowing without severe foraminal impingement in the cervical spine. Upper chest: Some apical emphysematous changes and biapical pleuroparenchymal scarring. Calcification of the proximal great vessels. Cervical carotid atherosclerosis is noted. Other: No concerning thyroid nodules or masses. Diminutive appearance of the thyroid. IMPRESSION: 1. Right frontal/supraorbital swelling and  laceration extending to the temporal soft tissues as well. Thin crescentic scalp hematoma measuring up to 6 mm maximal thickness. No subjacent calvarial fracture or other acute osseous injury. 2. No acute intracranial abnormality. 3. Background of chronic microvascular angiopathy and parenchymal volume loss. Ventricular dilatation is slightly out of proportion to the degree of sulcal prominence with narrowing of the callososeptal angle and upward bowing of the corpus callosum. Findings could suggest a normal pressure hydrocephalus though are similar to comparison imaging. Correlate with clinical symptoms. 4. Mild right malar contusion and laceration. 5. No acute facial bone fracture. 6. Chronic appearing deformities of the nasal bones. 7. Postsurgical changes prior sinus surgery. Residual pneumatized secretions and thickening within the posterior ethmoids on the left with slightly hyperostotic changes likely reflecting an acute on chronic sinusitis. 8. No evidence of acute fracture or traumatic listhesis of the cervical spine. 9. Diffuse cervical spondylitic changes with multifactorial mild canal stenosis and mild-to-moderate multilevel foraminal narrowing as described above. 10. Cervical and intracranial atherosclerosis. 11.  Emphysema (ICD10-J43.9). Electronically Signed   By: Lovena Le M.D.   On: 01/13/2020 04:19   DG Hip Unilat W or Wo Pelvis 2-3 Views Right  Result Date: 01/16/2020 CLINICAL DATA:  Initial evaluation for acute trauma, fall. EXAM: DG HIP (WITH OR WITHOUT PELVIS) 2-3V RIGHT  COMPARISON:  None. FINDINGS: Acute comminuted intratrochanteric fracture of the right hip with superior subluxation. Bony pelvis grossly intact. Limited views of the left hip demonstrate no acute finding. Underlying osteopenia. Prominent vascular calcifications about the proximal thighs. Degenerative change noted within the lower lumbar spine. IMPRESSION: Acute comminuted intertrochanteric fracture of the right hip.  Electronically Signed   By: Jeannine Boga M.D.   On: 12/21/2019 02:10   CT Maxillofacial WO CM  Result Date: 12/31/2019 CLINICAL DATA:  Fall, right cheek skin tear and right supraorbital hematoma EXAM: CT HEAD WITHOUT CONTRAST CT MAXILLOFACIAL WITHOUT CONTRAST CT CERVICAL SPINE WITHOUT CONTRAST TECHNIQUE: Multidetector CT imaging of the head, cervical spine, and maxillofacial structures were performed using the standard protocol without intravenous contrast. Multiplanar CT image reconstructions of the cervical spine and maxillofacial structures were also generated. COMPARISON:  CT head 12/17/2014, MRI 12/12/2009, paranasal sinus CT 11/04/2009 FINDINGS: CT HEAD FINDINGS Brain: No evidence of acute infarction, hemorrhage, hydrocephalus, extra-axial collection, visible mass lesion or mass effect. Diffuse prominence of the cisterns and sulci compatible with parenchymal volume loss. Ventricular dilatation is sat Lea out of proportion to the degree of sulcal prominence with slight narrowing of the callososeptal angle and upward bowing of the corpus callosum. Patchy areas of white matter hypoattenuation are most compatible with chronic microvascular angiopathy. Midline structures are normal. Cerebellar tonsils are normally position. Vascular: Atherosclerotic calcification of the carotid siphons and intradural vertebral arteries. No hyperdense vessel. Skull: Right frontal/supraorbital swelling and laceration extending to the temporal soft tissues as well. Thin crescentic scalp hematoma measuring up to 6 mm maximal thickness. No subjacent calvarial fracture or other acute osseous injury of the calvaria. Other: None. CT MAXILLOFACIAL FINDINGS Osseous: No fracture of the bony orbits. Chronic appearing deformities of the nasal bones similar to comparison imaging in 2011. No other mid face fractures are seen. The pterygoid plates are intact. No visible or suspected temporal bone fractures. Temporomandibular joints  are normally aligned. The mandible is intact. Numerous dental amalgams and metallic orthotic hardware limit evaluation of the dentition. No clearly fractured or avulsed teeth. Orbits: Some slightly asymmetric right supraorbital swelling and palpebral thickening. No retro septal gas, stranding or hemorrhage. Bilateral lens extractions. Small amount of right sub palpebral gas is a normal finding. Sinuses: Postsurgical changes from prior antrectomies and partial ethmoidectomies. Some pneumatized secretions and thickening are present within the posterior ethmoids on the left with slightly hyperostotic changes likely reflecting an acute on chronic sinusitis. Remaining paranasal sinuses and mastoid air cells are predominantly clear. Middle ear cavities are clear. Ossicular chains are normally configured. Soft tissues: Right periorbital and supraorbital soft tissue swelling. Mild right malar contusion and laceration as well. Question some mild pre mental thickening. Soft tissues are otherwise unremarkable without further gas or foreign body. CT CERVICAL SPINE FINDINGS Alignment: Stabilization collar is absent at the time of examination slightly exaggerated cervical lordosis. Minimal anterolisthesis C2 on 3 and retrolisthesis C3 on 4 is favored to be on a degenerative basis with spondylitic changes at these levels. No evidence of traumatic listhesis. No abnormally widened, perched or jumped facets. Normal alignment of the craniocervical articulations accounting for mild rightward cranial rotation. Effacement of the atlantodental interval likely related to advanced arthrosis. Skull base and vertebrae: The osseous structures appear diffusely demineralized which may limit detection of small or nondisplaced fractures. No visible skull base fracture is seen. No discernible fracture of the vertebral bodies or posterior elements. No worrisome lytic or blastic lesions. Multilevel cervical spondylitic changes as below.  Advanced  arthrosis at the atlantodental and basion dens interval including a likely degenerative os odontoideum. Soft tissues and spinal canal: No pre or paravertebral fluid or swelling. No visible canal hematoma. Some minimal enthesopathic mineralization in the nuchal ligament. Disc levels: Multilevel cervical spondylitic changes are present with diffuse intervertebral disc height loss and posterior ridging with disc osteophyte complexes. Larger complexes at C4-5 and C5-6 result in at most mild canal stenosis with only partial effacement of ventral thecal sac at the remaining cervical levels. Diffuse uncinate spurring and facet hypertrophic changes result in mild-to-moderate multilevel neural foraminal narrowing without severe foraminal impingement in the cervical spine. Upper chest: Some apical emphysematous changes and biapical pleuroparenchymal scarring. Calcification of the proximal great vessels. Cervical carotid atherosclerosis is noted. Other: No concerning thyroid nodules or masses. Diminutive appearance of the thyroid. IMPRESSION: 1. Right frontal/supraorbital swelling and laceration extending to the temporal soft tissues as well. Thin crescentic scalp hematoma measuring up to 6 mm maximal thickness. No subjacent calvarial fracture or other acute osseous injury. 2. No acute intracranial abnormality. 3. Background of chronic microvascular angiopathy and parenchymal volume loss. Ventricular dilatation is slightly out of proportion to the degree of sulcal prominence with narrowing of the callososeptal angle and upward bowing of the corpus callosum. Findings could suggest a normal pressure hydrocephalus though are similar to comparison imaging. Correlate with clinical symptoms. 4. Mild right malar contusion and laceration. 5. No acute facial bone fracture. 6. Chronic appearing deformities of the nasal bones. 7. Postsurgical changes prior sinus surgery. Residual pneumatized secretions and thickening within the posterior  ethmoids on the left with slightly hyperostotic changes likely reflecting an acute on chronic sinusitis. 8. No evidence of acute fracture or traumatic listhesis of the cervical spine. 9. Diffuse cervical spondylitic changes with multifactorial mild canal stenosis and mild-to-moderate multilevel foraminal narrowing as described above. 10. Cervical and intracranial atherosclerosis. 11.  Emphysema (ICD10-J43.9). Electronically Signed   By: Lovena Le M.D.   On: 01/13/2020 04:19    EKG: I independently viewed the EKG done and my findings are as followed: Atrial flutter AV block at a rate of 61 bpm  Assessment/Plan Present on Admission: . Closed right hip fracture, initial encounter ([place]) . Hyperlipidemia . CKD (chronic kidney disease) . CAD (coronary artery disease) . GERD (gastroesophageal reflux disease)  Active Problems:   Hyperlipidemia   CKD (chronic kidney disease)   CAD (coronary artery disease)   Diabetes mellitus without complication (HCC)   GERD (gastroesophageal reflux disease)   Closed right hip fracture, initial encounter (HCC)   Facial laceration   Leukocytosis   Hyperglycemia   Acute comminuted intertrochanteric fracture of right hip secondary to accidental fall at home Right hip x-ray showed acute comminuted intratrochanteric fracture of the right hip with superior subluxation Continue IV morphine 2 mg every 4 hours as needed for moderate/severe pain Patient will be placed n.p.o. at midnight in x-ray for possible surgical procedure tomorrow (11/21) Continue fall precaution and neurochecks Continue PT/OT eval and treat Orthopedic surgeon was already consulted and recommended patient to be admitted to [facility] for a pending surgical procedure possibly tomorrow as indicated above  Right frontal/supraorbital swelling and laceration Continue wound care  Leukocytosis possibly reactive WBC 16.4, no obvious sign of any acute infectious process Continue to monitor WBC  with morning labs  Hyperglycemia possible secondary to type II DM CBG 162 last A1c on 6/14 was 6.5 Patient was not on any antidiabetic medication Continue heart healthy/carb modified diet Continue to monitor  blood glucose level with morning labs  CKD stage IIIa Creatinine at baseline Renally adjust medications, avoid nephrotoxic agents/dehydration/hypotension  Chronic atrial fibrillation Diltiazem will be temporarily held due to soft BP Warfarin will be held at this time due to impending surgical procedure  CAD Continue Crestor Warfarin on hold due to impending surgical procedure  Hyperlipidemia Continue Crestor  GERD Continue Protonix  Parkinson's disease Continue meds Sinemet per home regimen   DVT prophylaxis: SCDs  Code Status: Full code  Family Communication: Wife at bedside (call questions answered to satisfaction)  Disposition Plan:  Patient is from:                        home Anticipated DC to:                   SNF or family members home Anticipated DC date:               2-3 days Anticipated DC barriers:         Patient is unstable to be discharged at this time due to right hip fracture which required surgical intervention   Consults called: Orthopedic surgeon (by ED physician)  Admission status: Inpatient    Bernadette Hoit MD [facility]  01/07/2020, 7:38 AM

## 2020-01-09 NOTE — ED Notes (Signed)
Assisted in taking patient over for X-ray scans. Patient returned to room at this time. Patient given two warm blankets. Wife at bedside along with RN Jacqulynn Cadet.

## 2020-01-09 NOTE — ED Provider Notes (Signed)
[facility] EMERGENCY DEPARTMENT Provider Note   CSN: [id number] Arrival date & time: 01/07/2020  2258   Time seen 11:13 PM  History Chief Complaint  Patient presents with  . Fall  . Leg Injury    Robert Richard is a 86 y.o. male.  HPI   Patient states he had gotten out of the shower and he does not know why bit he fell.  He complains of pain to the right side of his face and his right hip.  He states he was not dizzy or lightheaded before he fell.  Patient presents via EMS.  No medications were given.  Patient does have a history of Parkinson's disease.  PCP [name], MD   Past Medical History:  Diagnosis Date  . Allergic rhinitis   . Anemia   . Arthritis    SHOULDER  . Asthma with bronchitis   . Colon polyp   . COPD (chronic obstructive pulmonary disease) (HCC)    Astmatic bronchitis  . Dysrhythmia 08/2012   NEW ONSET ATRIAL FIBRILATION  . Gout    PMH  . Headache(784.0)   . HLD (hyperlipidemia)   . Nephrolithiasis   . Osteoporosis   . RLS (restless legs syndrome)   . Skin cancer (melanoma) ([place]) 2012   Right neck  . Skin cancer, basal cell    Dr Nevada Crane, [facility]    Patient Active Problem List   Diagnosis Date Noted  . Parkinsonism ([place]) 08/02/2019  . Cognitive changes 08/02/2019  . Left sided abdominal pain 01/14/2019  . GERD (gastroesophageal reflux disease) 11/29/2017  . Confusion 10/15/2017  . Anxiety 11/05/2016  . Cough 09/12/2016  . Diabetes mellitus without complication ([place]) 78/46/9629  . Pain in both lower extremities 05/01/2016  . Bilateral hearing loss 05/01/2016  . Migraine 04/26/2015  . Aortic valve replaced 04/21/2015  . Melanoma of neck ([place]) 07/05/2014  . Encounter for therapeutic drug monitoring 03/20/2013  . CAD (coronary artery disease) 03/13/2013  . Atrial fibrillation, controlled ([place]) 03/13/2013  . Protein-calorie malnutrition, severe ([place]) 02/27/2013  . S/P CABG x 3 02/20/2013  . History of embolectomy 09/22/2012  . CKD  (chronic kidney disease) 09/22/2012  . Syncope 09/15/2012  . Peripheral vascular disease, unspecified ([place]) 07/08/2012  . Mitral stenosis 07/27/2010  . Hyperlipidemia 10/25/2009  . NEPHROLITHIASIS, HX OF 10/25/2009  . SNORING 10/14/2008  . APNEA 10/05/2008  . RESTLESS LEG SYNDROME 09/11/2007  . Allergic rhinitis, cause unspecified 09/11/2007  . GOUT 03/15/2006  . ASTHMA 03/15/2006  . COPD 03/15/2006  . Osteoporosis 03/15/2006  . COLONIC POLYPS, HX OF 03/15/2006    Past Surgical History:  Procedure Laterality Date  . AORTIC VALVE REPLACEMENT N/A 02/20/2013   Procedure: AORTIC VALVE REPLACEMENT (AVR);  Surgeon: Ivin Poot, MD;  Location: [place];  Service: Open Heart Surgery;  Laterality: N/A;  . COLONOSCOPY W/ POLYPECTOMY     Dr Deatra Ina  . CORONARY ARTERY BYPASS GRAFT N/A 02/20/2013   Procedure: CORONARY ARTERY BYPASS GRAFTING (CABG);  Surgeon: Ivin Poot, MD;  Location: [place];  Service: Open Heart Surgery;  Laterality: N/A;  . FINGER SURGERY     for foreign body  . INGUINAL HERNIA REPAIR    . INTRAOPERATIVE TRANSESOPHAGEAL ECHOCARDIOGRAM N/A 02/20/2013   Procedure: INTRAOPERATIVE TRANSESOPHAGEAL ECHOCARDIOGRAM;  Surgeon: Ivin Poot, MD;  Location: [place];  Service: Open Heart Surgery;  Laterality: N/A;  . LEFT AND RIGHT HEART CATHETERIZATION WITH CORONARY ANGIOGRAM N/A 01/05/2013   Procedure: LEFT AND RIGHT HEART CATHETERIZATION  WITH CORONARY ANGIOGRAM;  Surgeon: Blane Ohara, MD;  Location[facility] C[facility] LAB;  Service: Cardiovascular;  Laterality: N/A;  . LEG SURGERY  06/2010    Dr.Lawson, for blood clott. Left leg   . NECK SURGERY  12/2010   Melanoma ; [facility]   . NOSE SURGERY     Septal Deviation  . RIGHT HEART CATHETERIZATION N/A 01/23/2013   Procedure: RIGHT HEART CATH;  Surgeon: Jolaine Artist, MD;  Location: [facility] CATH LAB;  Service: Cardiovascular;  Laterality: N/A;  . TEE WITHOUT CARDIOVERSION N/A 01/23/2013   Procedure: TRANSESOPHAGEAL ECHOCARDIOGRAM  (TEE);  Surgeon: Jolaine Artist, MD;  Location: [facility] ENDOSCOPY;  Service: Cardiovascular;  Laterality: N/A;  sign heart cath consent w/tee consent       Family History  Problem Relation Age of Onset  . Allergies Mother   . Coronary artery disease Mother   . Allergy (severe) Mother   . Allergies Father   . Stroke Father   . Heart attack Father 4  . Asthma Father   . Allergy (severe) Father   . Allergies Sister   . Allergy (severe) Sister   . Allergies Brother        x2  . Allergy (severe) Brother   . Diabetes Brother   . Allergy (severe) Brother   . Emphysema Brother        smoked  . Cancer Son     Social History   Tobacco Use  . Smoking status: Former Smoker    Packs/day: 3.00    Years: 15.00    Pack years: 45.00    Types: Cigarettes    Quit date: 02/19/1961    Years since quitting: 58.9  . Smokeless tobacco: Never Used  Vaping Use  . Vaping Use: Never used  Substance Use Topics  . Alcohol use: No  . Drug use: No  lives at home Lives with spouse  Home Medications Prior to Admission medications   Medication Sig Start Date End Date Taking? Authorizing Provider  busPIRone (BUSPAR) 15 MG tablet TAKE ONE TABLET BY MOUTH TWICE A DAY Annual appt is due w/labs must see provider for future refills 06/16/19   [name], MD  carbidopa-levodopa (SINEMET IR) 25-100 MG tablet Take 1 tablet at 8am, 1pm, and 6pm 03/30/19   Narda Amber K, DO  diltiazem (CARDIZEM CD) 240 MG 24 hr capsule TAKE ONE CAPSULE (240MG  TOTAL) BY MOUTH DAILY 11/10/19   Fay Records, MD  gabapentin (NEURONTIN) 100 MG capsule Take 1 capsule (100 mg total) by mouth at bedtime. 03/30/19   Narda Amber K, DO  ketotifen (ZADITOR) 0.025 % ophthalmic solution Place 1 drop into both eyes daily.     [provider]  Lidocaine HCl 4 % SOLN Apply 4 mLs topically as needed (for migraines).     [provider]  Multiple Vitamins-Minerals (DAILY MULTIVITAMIN) CAPS Take 1 capsule by mouth daily.   12/21/10   [provider]  nitroGLYCERIN (NITROSTAT) 0.4 MG SL tablet Place 0.4 mg under the tongue every 5 (five) minutes as needed for chest pain.    [provider]  pantoprazole (PROTONIX) 20 MG tablet Take 1 tablet (20 mg total) by mouth daily. Take 30 minutes prior to a meal.  For heartburn 08/03/19   [name], MD  rosuvastatin (CRESTOR) 5 MG tablet Take 0.5 tablets (2.5 mg total) by mouth every other day. 11/19/19   Fay Records, MD  topiramate (TOPAMAX) 25 MG tablet Take 75  mg by mouth daily.    [provider]  warfarin (JANTOVEN) 2 MG tablet TAKE ONE (1) TABLET BY MOUTH EVERY DAY OR AS DIRECTED BY COUMADIN CLINIC 12/08/19   Fay Records, MD    Allergies    Iodinated diagnostic agents and Ioxaglate  Review of Systems   Review of Systems  All other systems reviewed and are negative.   Physical Exam Updated Vital Signs BP 104/61   Pulse (!) 115   Temp 98.1 F (36.7 C)   Resp 20   Ht 6\' 2"  (1.88 m)   Wt 65.8 kg   SpO2 96%   BMI 18.62 kg/m   Physical Exam Vitals and nursing note reviewed.  Constitutional:      General: He is in acute distress.     Appearance: Normal appearance. He is normal weight.     Comments: Frail elderly male who is hard of hearing  HENT:     Head: Normocephalic.     Comments: Patient has a hematoma in his right forehead, there is a skin tear seen on his right cheek area.    Nose: Nose normal.  Eyes:     Extraocular Movements: Extraocular movements intact.     Conjunctiva/sclera: Conjunctivae normal.     Pupils: Pupils are equal, round, and reactive to light.  Cardiovascular:     Rate and Rhythm: Normal rate. Rhythm irregular.     Pulses: Normal pulses.  Pulmonary:     Effort: Pulmonary effort is normal. No respiratory distress.     Breath sounds: Normal breath sounds.  Chest:     Chest wall: No tenderness.  Abdominal:     General: Abdomen is flat. Bowel sounds are normal.     Palpations: Abdomen is soft.      Tenderness: There is no abdominal tenderness.  Musculoskeletal:     Comments: Patient is laying on his left side.  He has his knees flexed.  I am unable to tell if there is shortening or internal or external rotation of his right extremity.  He has no pain or deformity noted in his upper extremities.  Skin:    General: Skin is warm and dry.  Neurological:     General: No focal deficit present.     Mental Status: He is alert and oriented to person, place, and time.     Cranial Nerves: No cranial nerve deficit.  Psychiatric:        Mood and Affect: Mood normal.        Behavior: Behavior normal.        Thought Content: Thought content normal.       ED Results / Procedures / Treatments   Labs (all labs ordered are listed, but only abnormal results are displayed) Results for orders placed or performed during the hospital encounter of 12/22/2019  Resp Panel by RT-PCR (Flu A&B, Covid) Nasopharyngeal Swab   Specimen: Nasopharyngeal Swab; Nasopharyngeal(NP) swabs in vial transport medium  Result Value Ref Range   SARS Coronavirus 2 by RT PCR NEGATIVE NEGATIVE   Influenza A by PCR NEGATIVE NEGATIVE   Influenza B by PCR NEGATIVE NEGATIVE  Comprehensive metabolic panel  Result Value Ref Range   Sodium 137 135 - 145 mmol/L   Potassium 4.5 3.5 - 5.1 mmol/L   Chloride 108 98 - 111 mmol/L   CO2 21 (L) 22 - 32 mmol/L   Glucose, Bld 162 (H) 70 - 99 mg/dL   BUN 20 8 - 23 mg/dL  Creatinine, Ser 1.44 (H) 0.61 - 1.24 mg/dL   Calcium 8.9 8.9 - 10.3 mg/dL   Total Protein 7.3 6.5 - 8.1 g/dL   Albumin 3.5 3.5 - 5.0 g/dL   AST 20 15 - 41 U/L   ALT 13 0 - 44 U/L   Alkaline Phosphatase 82 38 - 126 U/L   Total Bilirubin 0.6 0.3 - 1.2 mg/dL   GFR, Estimated 47 (L) >60 mL/min   Anion gap 8 5 - 15  CBC with Differential  Result Value Ref Range   WBC 16.4 (H) 4.0 - 10.5 K/uL   RBC 3.21 (L) 4.22 - 5.81 MIL/uL   Hemoglobin 9.6 (L) 13.0 - 17.0 g/dL   HCT 30.5 (L) 39 - 52 %   MCV 95.0 80.0 - 100.0  fL   MCH 29.9 26.0 - 34.0 pg   MCHC 31.5 30.0 - 36.0 g/dL   RDW 13.2 11.5 - 15.5 %   Platelets 194 150 - 400 K/uL   nRBC 0.0 0.0 - 0.2 %   Neutrophils Relative % 85 %   Neutro Abs 13.9 (H) 1.7 - 7.7 K/uL   Lymphocytes Relative 7 %   Lymphs Abs 1.1 0.7 - 4.0 K/uL   Monocytes Relative 7 %   Monocytes Absolute 1.2 (H) 0.1 - 1.0 K/uL   Eosinophils Relative 0 %   Eosinophils Absolute 0.0 0.0 - 0.5 K/uL   Basophils Relative 0 %   Basophils Absolute 0.0 0.0 - 0.1 K/uL   Immature Granulocytes 1 %   Abs Immature Granulocytes 0.12 (H) 0.00 - 0.07 K/uL  Protime-INR  Result Value Ref Range   Prothrombin Time 27.4 (H) 11.4 - 15.2 seconds   INR 2.7 (H) 0.8 - 1.2   Laboratory interpretation all normal except therapeutic INR, hyperglycemia, renal insufficiency, stable anemia, leukocytosis    EKG EKG Interpretation  Date/Time:  Friday January 08 2020 23:09:33 EST Ventricular Rate:  61 PR Interval:    QRS Duration: 119 QT Interval:  445 QTC Calculation: 449 R Axis:   -80 Text Interpretation: Atrial flutter with varied AV block, Incomplete RBBB and LAFB Anterior infarct, old Minimal ST elevation, lateral leads Baseline wander Electrode noise Since last tracing rate slower 13 Mar 2013 Confirmed by Rolland Porter [phone]) on 01/01/2020 12:12:49 AM   Radiology DG Chest 1 View  Result Date: 01/11/2020 CLINICAL DATA:  Initial evaluation for preoperative evaluation, hip fracture. EXAM: CHEST  1 VIEW COMPARISON:  Prior radiograph from 10/15/2017 FINDINGS: Median sternotomy wires underlying CABG markers and valvular prosthesis noted. Mild exaggeration of the cardiac silhouette related AP technique. Mediastinal silhouette normal. Aortic atherosclerosis. Lungs mildly hypoinflated. Associated mild patchy left basilar opacity, likely atelectasis. No focal infiltrates. Mild perihilar vascular congestion without overt pulmonary edema. No pleural effusion. No pneumothorax. No acute osseous finding.   Osteopenia. IMPRESSION: 1. Hypoinflation with mild patchy left basilar opacity, likely atelectasis. 2. 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 3.  Aortic Atherosclerosis (ICD10-I70.0). Electronically Signed   By: Jeannine Boga M.D.   On: 12/28/2019 02:12   CT Head Wo Contrast CT Maxillofacial WO CM  CT Cervical Spine Wo Contrast  Result Date: 12/22/2019 CLINICAL DATA:  Fall, right cheek skin tear and right supraorbital hematoma EXAM: CT HEAD WITHOUT CONTRAST CT MAXILLOFACIAL WITHOUT CONTRAST CT CERVICAL SPINE WITHOUT CONTRAST TECHNIQUE: Multidetector CT imaging of the head, cervical spine, and maxillofacial structures were performed using the standard protocol without intravenous contrast. Multiplanar CT image reconstructions of the cervical spine and maxillofacial  structures were also generated. COMPARISON:  CT head 12/17/2014, MRI 12/12/2009, paranasal sinus CT 11/04/2009 FINDINGS: CT HEAD FINDINGS Brain: No evidence of acute infarction, hemorrhage, hydrocephalus, extra-axial collection, visible mass lesion or mass effect. Diffuse prominence of the cisterns and sulci compatible with parenchymal volume loss. Ventricular dilatation is sat Lea out of proportion to the degree of sulcal prominence with slight narrowing of the callososeptal angle and upward bowing of the corpus callosum. Patchy areas of white matter hypoattenuation are most compatible with chronic microvascular angiopathy. Midline structures are normal. Cerebellar tonsils are normally position. Vascular: Atherosclerotic calcification of the carotid siphons and intradural vertebral arteries. No hyperdense vessel. Skull: Right frontal/supraorbital swelling and laceration extending to the temporal soft tissues as well. Thin crescentic scalp hematoma measuring up to 6 mm maximal thickness. No subjacent calvarial fracture or other acute osseous injury of the calvaria. Other: None. CT MAXILLOFACIAL FINDINGS Osseous: No fracture  of the bony orbits. Chronic appearing deformities of the nasal bones similar to comparison imaging in 2011. No other mid face fractures are seen. The pterygoid plates are intact. No visible or suspected temporal bone fractures. Temporomandibular joints are normally aligned. The mandible is intact. Numerous dental amalgams and metallic orthotic hardware limit evaluation of the dentition. No clearly fractured or avulsed teeth. Orbits: Some slightly asymmetric right supraorbital swelling and palpebral thickening. No retro septal gas, stranding or hemorrhage. Bilateral lens extractions. Small amount of right sub palpebral gas is a normal finding. Sinuses: Postsurgical changes from prior antrectomies and partial ethmoidectomies. Some pneumatized secretions and thickening are present within the posterior ethmoids on the left with slightly hyperostotic changes likely reflecting an acute on chronic sinusitis. Remaining paranasal sinuses and mastoid air cells are predominantly clear. Middle ear cavities are clear. Ossicular chains are normally configured. Soft tissues: Right periorbital and supraorbital soft tissue swelling. Mild right malar contusion and laceration as well. Question some mild pre mental thickening. Soft tissues are otherwise unremarkable without further gas or foreign body. CT CERVICAL SPINE FINDINGS Alignment: Stabilization collar is absent at the time of examination slightly exaggerated cervical lordosis. Minimal anterolisthesis C2 on 3 and retrolisthesis C3 on 4 is favored to be on a degenerative basis with spondylitic changes at these levels. No evidence of traumatic listhesis. No abnormally widened, perched or jumped facets. Normal alignment of the craniocervical articulations accounting for mild rightward cranial rotation. Effacement of the atlantodental interval likely related to advanced arthrosis. Skull base and vertebrae: The osseous structures appear diffusely demineralized which may limit  detection of small or nondisplaced fractures. No visible skull base fracture is seen. No discernible fracture of the vertebral bodies or posterior elements. No worrisome lytic or blastic lesions. Multilevel cervical spondylitic changes as below. Advanced arthrosis at the atlantodental and basion dens interval including a likely degenerative os odontoideum. Soft tissues and spinal canal: No pre or paravertebral fluid or swelling. No visible canal hematoma. Some minimal enthesopathic mineralization in the nuchal ligament. Disc levels: Multilevel cervical spondylitic changes are present with diffuse intervertebral disc height loss and posterior ridging with disc osteophyte complexes. Larger complexes at C4-5 and C5-6 result in at most mild canal stenosis with only partial effacement of ventral thecal sac at the remaining cervical levels. Diffuse uncinate spurring and facet hypertrophic changes result in mild-to-moderate multilevel neural foraminal narrowing without severe foraminal impingement in the cervical spine. Upper chest: Some apical emphysematous changes and biapical pleuroparenchymal scarring. Calcification of the proximal great vessels. Cervical carotid atherosclerosis is noted. Other: No concerning thyroid nodules or masses.  Diminutive appearance of the thyroid. IMPRESSION: 1. Right frontal/supraorbital swelling and laceration extending to the temporal soft tissues as well. Thin crescentic scalp hematoma measuring up to 6 mm maximal thickness. No subjacent calvarial fracture or other acute osseous injury. 2. No acute intracranial abnormality. 3. Background of chronic microvascular angiopathy and parenchymal volume loss. Ventricular dilatation is slightly out of proportion to the degree of sulcal prominence with narrowing of the callososeptal angle and upward bowing of the corpus callosum. Findings could suggest a normal pressure hydrocephalus though are similar to comparison imaging. Correlate with clinical  symptoms. 4. Mild right malar contusion and laceration. 5. No acute facial bone fracture. 6. Chronic appearing deformities of the nasal bones. 7. Postsurgical changes prior sinus surgery. Residual pneumatized secretions and thickening within the posterior ethmoids on the left with slightly hyperostotic changes likely reflecting an acute on chronic sinusitis. 8. No evidence of acute fracture or traumatic listhesis of the cervical spine. 9. Diffuse cervical spondylitic changes with multifactorial mild canal stenosis and mild-to-moderate multilevel foraminal narrowing as described above. 10. Cervical and intracranial atherosclerosis. 11.  Emphysema (ICD10-J43.9). Electronically Signed   By: Lovena Le M.D.   On: 01/13/2020 04:19   DG Hip Unilat W or Wo Pelvis 2-3 Views Right  Result Date: 01/08/2020 CLINICAL DATA:  Initial evaluation for acute trauma, fall. EXAM: DG HIP (WITH OR WITHOUT PELVIS) 2-3V RIGHT COMPARISON:  None. FINDINGS: Acute comminuted intratrochanteric fracture of the right hip with superior subluxation. Bony pelvis grossly intact. Limited views of the left hip demonstrate no acute finding. Underlying osteopenia. Prominent vascular calcifications about the proximal thighs. Degenerative change noted within the lower lumbar spine. IMPRESSION: Acute comminuted intertrochanteric fracture of the right hip. Electronically Signed   By: Jeannine Boga M.D.   On: 12/26/2019 02:10      Procedures Procedures (including critical care time)  Medications Ordered in ED Medications  fentaNYL (SUBLIMAZE) injection 50 mcg (50 mcg Intravenous Given 01/18/2020 2350)    ED Course  I have reviewed the triage vital signs and the nursing notes.  Pertinent labs & imaging results that were available during my care of the patient were reviewed by me and considered in my medical decision making (see chart for details).    MDM Rules/Calculators/A&P                          Patient was not given any  pain medicine by EMS.  He was given fentanyl IV in the ED.  CT of his head, maxillofacial, cervical spine was done in addition to his right hip which is his main complaint of pain.  1:10 AM wife is now in the room.  I gave her a copy of his hip x-ray.  Patient has a trochanteric fracture and we discussed the need for surgery.  She states she would like him to go to [place] [facility] and he has seen an orthopedist in [place] many years ago but they cannot recall the name.  I have scanned through his imaging studies and his notes which are multiple and do not see any obvious orthopedic notes.  3:00 AM wife states that she has seen Dr. Wynelle Link in the past, patient has not but that is who she wants him to take care of her husband.  Patient had just left to go to CT scan.  CT scans have resulted.  5:26 AM Dr. Rolena Infante, orthopedist on call for [facility] states that Dr. Alvan Dame can do the patient  surgery on Sunday, November 21 at [place].  5:49 AM Dr. Josephine Cables, hospitalist will admit patient to hospitalist service at [facility].  Final Clinical Impression(s) / ED Diagnoses Final diagnoses:  Fall in home, initial encounter  Contusion of scalp, initial encounter  Closed nondisplaced intertrochanteric fracture of right femur, initial encounter [facility])    Rx / DC Orders    Plan admission  Rolland Porter, MD, Barbette Or, MD 12/26/2019 [phone]

## 2020-01-09 NOTE — Progress Notes (Signed)
Patient's heart rate has been in the 120's since admission, making him a yellow MEWS, protocol continue. He has no IV fluids ordered, will be NPO a midnight. States pain is currently a 3 out of 10 on right hip. NP [place] notified.

## 2020-01-09 NOTE — ED Notes (Signed)
Patient transported to CT 

## 2020-01-09 NOTE — Progress Notes (Signed)
Patient had a yellow MEWS due to pulse rate of 127.  Patient has been newly diagnosed with Afib.  Charge nurse was notified.  Yellow MEWS protocol has been initiated.

## 2020-01-10 ENCOUNTER — Encounter (HOSPITAL_COMMUNITY): Admission: EM | Disposition: E | Payer: Self-pay | Source: Home / Self Care | Attending: Family Medicine

## 2020-01-10 ENCOUNTER — Inpatient Hospital Stay (HOSPITAL_COMMUNITY): Payer: Medicare Other

## 2020-01-10 ENCOUNTER — Inpatient Hospital Stay (HOSPITAL_COMMUNITY): Payer: Medicare Other | Admitting: Anesthesiology

## 2020-01-10 ENCOUNTER — Encounter (HOSPITAL_COMMUNITY): Payer: Self-pay | Admitting: Internal Medicine

## 2020-01-10 DIAGNOSIS — S72001A Fracture of unspecified part of neck of right femur, initial encounter for closed fracture: Secondary | ICD-10-CM | POA: Diagnosis not present

## 2020-01-10 DIAGNOSIS — S72141A Displaced intertrochanteric fracture of right femur, initial encounter for closed fracture: Secondary | ICD-10-CM | POA: Diagnosis not present

## 2020-01-10 DIAGNOSIS — I2510 Atherosclerotic heart disease of native coronary artery without angina pectoris: Secondary | ICD-10-CM | POA: Diagnosis not present

## 2020-01-10 DIAGNOSIS — N183 Chronic kidney disease, stage 3 unspecified: Secondary | ICD-10-CM | POA: Diagnosis not present

## 2020-01-10 HISTORY — PX: INTRAMEDULLARY (IM) NAIL INTERTROCHANTERIC: SHX5875

## 2020-01-10 LAB — COMPREHENSIVE METABOLIC PANEL
ALT: <5 U/L (ref 0–44)
AST: 22 U/L (ref 15–41)
Albumin: 3.5 g/dL (ref 3.5–5.0)
Alkaline Phosphatase: 75 U/L (ref 38–126)
Anion gap: 11 (ref 5–15)
BUN: 28 mg/dL — ABNORMAL HIGH (ref 8–23)
CO2: 19 mmol/L — ABNORMAL LOW (ref 22–32)
Calcium: 8.9 mg/dL (ref 8.9–10.3)
Chloride: 110 mmol/L (ref 98–111)
Creatinine, Ser: 1.59 mg/dL — ABNORMAL HIGH (ref 0.61–1.24)
GFR, Estimated: 42 mL/min — ABNORMAL LOW (ref >60)
Glucose, Bld: 139 mg/dL — ABNORMAL HIGH (ref 70–99)
Potassium: 4.7 mmol/L (ref 3.5–5.1)
Sodium: 140 mmol/L (ref 135–145)
Total Bilirubin: 0.9 mg/dL (ref 0.3–1.2)
Total Protein: 7.1 g/dL (ref 6.5–8.1)

## 2020-01-10 LAB — PHOSPHORUS: Phosphorus: 3.6 mg/dL (ref 2.5–4.6)

## 2020-01-10 LAB — CBC
HCT: 27.1 % — ABNORMAL LOW (ref 39.0–52.0)
Hemoglobin: 8.8 g/dL — ABNORMAL LOW (ref 13.0–17.0)
MCH: 30 pg (ref 26.0–34.0)
MCHC: 32.5 g/dL (ref 30.0–36.0)
MCV: 92.5 fL (ref 80.0–100.0)
Platelets: 184 10*3/uL (ref 150–400)
RBC: 2.93 MIL/uL — ABNORMAL LOW (ref 4.22–5.81)
RDW: 13.3 % (ref 11.5–15.5)
WBC: 11.4 10*3/uL — ABNORMAL HIGH (ref 4.0–10.5)
nRBC: 0 % (ref 0.0–0.2)

## 2020-01-10 LAB — MAGNESIUM: Magnesium: 2 mg/dL (ref 1.7–2.4)

## 2020-01-10 LAB — PROTIME-INR
INR: 1.7 — ABNORMAL HIGH (ref 0.8–1.2)
Prothrombin Time: 19.2 seconds — ABNORMAL HIGH (ref 11.4–15.2)

## 2020-01-10 LAB — APTT: aPTT: 46 seconds — ABNORMAL HIGH (ref 24–36)

## 2020-01-10 LAB — PREPARE RBC (CROSSMATCH)

## 2020-01-10 SURGERY — FIXATION, FRACTURE, INTERTROCHANTERIC, WITH INTRAMEDULLARY ROD
Anesthesia: General | Site: Hip | Laterality: Right

## 2020-01-10 MED ORDER — ENOXAPARIN SODIUM 30 MG/0.3ML ~~LOC~~ SOLN
30.0000 mg | Freq: Two times a day (BID) | SUBCUTANEOUS | Status: DC
  Administered 2020-01-11 – 2020-01-13 (×5): 30 mg via SUBCUTANEOUS
  Filled 2020-01-10 (×5): qty 0.3

## 2020-01-10 MED ORDER — ROCURONIUM BROMIDE 100 MG/10ML IV SOLN
INTRAVENOUS | Status: DC | PRN
  Administered 2020-01-10: 60 mg via INTRAVENOUS

## 2020-01-10 MED ORDER — ALBUMIN HUMAN 5 % IV SOLN
INTRAVENOUS | Status: AC
  Filled 2020-01-10: qty 250

## 2020-01-10 MED ORDER — TRANEXAMIC ACID-NACL 1000-0.7 MG/100ML-% IV SOLN
INTRAVENOUS | Status: AC
  Filled 2020-01-10: qty 100

## 2020-01-10 MED ORDER — LACTATED RINGERS IV SOLN: INTRAVENOUS | Status: DC | PRN

## 2020-01-10 MED ORDER — PROPOFOL 10 MG/ML IV BOLUS
INTRAVENOUS | Status: AC
  Filled 2020-01-10: qty 20

## 2020-01-10 MED ORDER — LIDOCAINE 2% (20 MG/ML) 5 ML SYRINGE
INTRAMUSCULAR | Status: AC
  Filled 2020-01-10: qty 5

## 2020-01-10 MED ORDER — WARFARIN - PHARMACIST DOSING INPATIENT: Freq: Every day | Status: DC

## 2020-01-10 MED ORDER — SUGAMMADEX SODIUM 200 MG/2ML IV SOLN
INTRAVENOUS | Status: DC | PRN
  Administered 2020-01-10: 150 mg via INTRAVENOUS

## 2020-01-10 MED ORDER — OXYCODONE HCL 5 MG PO TABS: 5.0000 mg | ORAL_TABLET | Freq: Once | ORAL | Status: DC | PRN

## 2020-01-10 MED ORDER — ONDANSETRON HCL 4 MG PO TABS: 4.0000 mg | ORAL_TABLET | Freq: Four times a day (QID) | ORAL | Status: DC | PRN

## 2020-01-10 MED ORDER — ENSURE ENLIVE PO LIQD
237.0000 mL | Freq: Two times a day (BID) | ORAL | Status: DC
  Administered 2020-01-11 – 2020-01-22 (×13): 237 mL via ORAL

## 2020-01-10 MED ORDER — ALBUMIN HUMAN 5 % IV SOLN: INTRAVENOUS | Status: DC | PRN

## 2020-01-10 MED ORDER — METOCLOPRAMIDE HCL 5 MG PO TABS: 5.0000 mg | ORAL_TABLET | Freq: Three times a day (TID) | ORAL | Status: DC | PRN

## 2020-01-10 MED ORDER — PROPOFOL 10 MG/ML IV BOLUS
INTRAVENOUS | Status: DC | PRN
  Administered 2020-01-10: 120 mg via INTRAVENOUS

## 2020-01-10 MED ORDER — FENTANYL CITRATE (PF) 100 MCG/2ML IJ SOLN
INTRAMUSCULAR | Status: AC
  Filled 2020-01-10: qty 2

## 2020-01-10 MED ORDER — PHENYLEPHRINE 40 MCG/ML (10ML) SYRINGE FOR IV PUSH (FOR BLOOD PRESSURE SUPPORT)
PREFILLED_SYRINGE | INTRAVENOUS | Status: AC
  Filled 2020-01-10: qty 20

## 2020-01-10 MED ORDER — METOCLOPRAMIDE HCL 5 MG/ML IJ SOLN
5.0000 mg | Freq: Three times a day (TID) | INTRAMUSCULAR | Status: DC | PRN
  Administered 2020-01-16: 10 mg via INTRAVENOUS
  Filled 2020-01-10: qty 2

## 2020-01-10 MED ORDER — TRANEXAMIC ACID-NACL 1000-0.7 MG/100ML-% IV SOLN
INTRAVENOUS | Status: DC | PRN
  Administered 2020-01-10: 1000 mg via INTRAVENOUS

## 2020-01-10 MED ORDER — SODIUM CHLORIDE 0.9 % IV SOLN: INTRAVENOUS | Status: DC

## 2020-01-10 MED ORDER — ISOPROPYL ALCOHOL 70 % SOLN
Status: DC | PRN
  Administered 2020-01-10: 1 via TOPICAL

## 2020-01-10 MED ORDER — PHENYLEPHRINE HCL (PRESSORS) 10 MG/ML IV SOLN
INTRAVENOUS | Status: DC | PRN
  Administered 2020-01-10: 80 ug via INTRAVENOUS
  Administered 2020-01-10: 120 ug via INTRAVENOUS
  Administered 2020-01-10 (×3): 80 ug via INTRAVENOUS

## 2020-01-10 MED ORDER — ACETAMINOPHEN 10 MG/ML IV SOLN
INTRAVENOUS | Status: AC
  Filled 2020-01-10: qty 100

## 2020-01-10 MED ORDER — SODIUM CHLORIDE 0.9 % IV SOLN
10.0000 mL/h | Freq: Once | INTRAVENOUS | Status: AC
  Administered 2020-01-10: 10 mL/h via INTRAVENOUS

## 2020-01-10 MED ORDER — ONDANSETRON HCL 4 MG/2ML IJ SOLN
4.0000 mg | Freq: Four times a day (QID) | INTRAMUSCULAR | Status: DC | PRN
  Administered 2020-01-16 – 2020-01-24 (×2): 4 mg via INTRAVENOUS
  Filled 2020-01-10 (×2): qty 2

## 2020-01-10 MED ORDER — MENTHOL 3 MG MT LOZG: 1.0000 | LOZENGE | OROMUCOSAL | Status: DC | PRN

## 2020-01-10 MED ORDER — ACETAMINOPHEN 160 MG/5ML PO SOLN: 1000.0000 mg | Freq: Once | ORAL | Status: DC | PRN

## 2020-01-10 MED ORDER — WARFARIN SODIUM 2 MG PO TABS
2.0000 mg | ORAL_TABLET | Freq: Once | ORAL | Status: AC
  Administered 2020-01-10: 17:00:00 2 mg via ORAL
  Filled 2020-01-10: qty 1

## 2020-01-10 MED ORDER — PHENOL 1.4 % MT LIQD
1.0000 | OROMUCOSAL | Status: DC | PRN
  Filled 2020-01-10: qty 177

## 2020-01-10 MED ORDER — ACETAMINOPHEN 500 MG PO TABS: 1000.0000 mg | ORAL_TABLET | Freq: Once | ORAL | Status: DC | PRN

## 2020-01-10 MED ORDER — ACETAMINOPHEN 10 MG/ML IV SOLN
1000.0000 mg | Freq: Once | INTRAVENOUS | Status: DC | PRN
  Administered 2020-01-10: 1000 mg via INTRAVENOUS

## 2020-01-10 MED ORDER — CEFAZOLIN SODIUM-DEXTROSE 2-4 GM/100ML-% IV SOLN
INTRAVENOUS | Status: AC
  Administered 2020-01-10: 2 g via INTRAVENOUS
  Filled 2020-01-10: qty 100

## 2020-01-10 MED ORDER — OXYCODONE HCL 5 MG/5ML PO SOLN: 5.0000 mg | Freq: Once | ORAL | Status: DC | PRN

## 2020-01-10 MED ORDER — CEFAZOLIN SODIUM-DEXTROSE 2-3 GM-%(50ML) IV SOLR
INTRAVENOUS | Status: DC | PRN
  Administered 2020-01-10: 2 g via INTRAVENOUS

## 2020-01-10 MED ORDER — SODIUM CHLORIDE 0.9 % IR SOLN
Status: DC | PRN
  Administered 2020-01-10: 1000 mL

## 2020-01-10 MED ORDER — CEFAZOLIN SODIUM-DEXTROSE 2-4 GM/100ML-% IV SOLN
2.0000 g | Freq: Four times a day (QID) | INTRAVENOUS | Status: AC
  Administered 2020-01-10: 21:00:00 2 g via INTRAVENOUS
  Filled 2020-01-10 (×2): qty 100

## 2020-01-10 MED ORDER — ISOPROPYL ALCOHOL 70 % SOLN
Status: AC
  Filled 2020-01-10: qty 480

## 2020-01-10 MED ORDER — DOCUSATE SODIUM 100 MG PO CAPS
100.0000 mg | ORAL_CAPSULE | Freq: Two times a day (BID) | ORAL | Status: DC
  Administered 2020-01-10 – 2020-01-13 (×5): 100 mg via ORAL
  Filled 2020-01-10 (×9): qty 1

## 2020-01-10 MED ORDER — FENTANYL CITRATE (PF) 100 MCG/2ML IJ SOLN
INTRAMUSCULAR | Status: DC | PRN
Start: 2020-01-10 — End: 2020-01-10
  Administered 2020-01-10 (×2): 50 ug via INTRAVENOUS

## 2020-01-10 MED ORDER — FENTANYL CITRATE (PF) 100 MCG/2ML IJ SOLN
25.0000 ug | INTRAMUSCULAR | Status: DC | PRN
  Administered 2020-01-10 (×2): 25 ug via INTRAVENOUS

## 2020-01-10 SURGICAL SUPPLY — 42 items
BAG ZIPLOCK 12X15 (MISCELLANEOUS) IMPLANT
BIT DRILL CANN LG 4.3MM (BIT) ×1 IMPLANT
CHLORAPREP W/TINT 26 (MISCELLANEOUS) ×3 IMPLANT
COVER PERINEAL POST (MISCELLANEOUS) ×3 IMPLANT
COVER SURGICAL LIGHT HANDLE (MISCELLANEOUS) ×3 IMPLANT
COVER WAND RF STERILE (DRAPES) IMPLANT
DERMABOND ADVANCED (GAUZE/BANDAGES/DRESSINGS) ×2
DERMABOND ADVANCED .7 DNX12 (GAUZE/BANDAGES/DRESSINGS) ×1 IMPLANT
DRAPE C-ARM 42X120 X-RAY (DRAPES) ×3 IMPLANT
DRAPE C-ARMOR (DRAPES) ×3 IMPLANT
DRAPE IMP U-DRAPE 54X76 (DRAPES) ×6 IMPLANT
DRAPE SHEET LG 3/4 BI-LAMINATE (DRAPES) ×6 IMPLANT
DRAPE STERI IOBAN 125X83 (DRAPES) ×3 IMPLANT
DRAPE U-SHAPE 47X51 STRL (DRAPES) ×6 IMPLANT
DRILL BIT CANN LG 4.3MM (BIT) ×3
DRSG MEPILEX BORDER 4X4 (GAUZE/BANDAGES/DRESSINGS) ×3 IMPLANT
DRSG MEPILEX BORDER 4X8 (GAUZE/BANDAGES/DRESSINGS) ×3 IMPLANT
ELECT BLADE TIP CTD 4 INCH (ELECTRODE) IMPLANT
FACESHIELD WRAPAROUND (MASK) ×6 IMPLANT
GAUZE SPONGE 4X4 12PLY STRL (GAUZE/BANDAGES/DRESSINGS) ×3 IMPLANT
GLOVE BIO SURGEON STRL SZ8.5 (GLOVE) ×6 IMPLANT
GLOVE BIOGEL M STRL SZ7.5 (GLOVE) ×6 IMPLANT
GLOVE BIOGEL PI IND STRL 8 (GLOVE) ×1 IMPLANT
GLOVE BIOGEL PI IND STRL 8.5 (GLOVE) ×1 IMPLANT
GLOVE BIOGEL PI INDICATOR 8 (GLOVE) ×2
GLOVE BIOGEL PI INDICATOR 8.5 (GLOVE) ×2
GOWN SPEC L3 XXLG W/TWL (GOWN DISPOSABLE) ×3 IMPLANT
GUIDEPIN 3.2X17.5 THRD DISP (PIN) ×3 IMPLANT
HFN 125 DEG 11MM X 180MM (Orthopedic Implant) ×3 IMPLANT
KIT BASIN OR (CUSTOM PROCEDURE TRAY) ×3 IMPLANT
KIT TURNOVER KIT A (KITS) IMPLANT
MANIFOLD NEPTUNE II (INSTRUMENTS) ×3 IMPLANT
MARKER SKIN DUAL TIP RULER LAB (MISCELLANEOUS) ×3 IMPLANT
PACK GENERAL/GYN (CUSTOM PROCEDURE TRAY) ×3 IMPLANT
PENCIL SMOKE EVACUATOR (MISCELLANEOUS) IMPLANT
SCREW BONE CORTICAL 5.0X38 (Screw) ×3 IMPLANT
SCREW LAG HIP NAIL 10.5X95 (Screw) ×3 IMPLANT
STAPLER VISISTAT 35W (STAPLE) ×3 IMPLANT
SUT MNCRL AB 3-0 PS2 18 (SUTURE) ×3 IMPLANT
SUT MON AB 2-0 CT1 36 (SUTURE) ×3 IMPLANT
SUT VIC AB 1 CT1 36 (SUTURE) ×3 IMPLANT
TOWEL OR 17X26 10 PK STRL BLUE (TOWEL DISPOSABLE) ×3 IMPLANT

## 2020-01-10 NOTE — Consult Note (Signed)
ORTHOPAEDIC CONSULTATION  REQUESTING PHYSICIAN: Mendel Corning, MD  PCP:  [name], MD  Chief Complaint: Right hip fracture  HPI: Robert Richard is a 86 y.o. male who complains of right hip pain and inability to weight-bear after a ground-level fall at home.  He had right hip pain and was taken to [facility] emergency department, where x-rays revealed a displaced right intertrochanteric femur fracture.  He also hit his head and sustained a skin tear to the cheek.  He has a history of A. fib on warfarin, CKD stage III, Parkinson's disease, GERD, hyperlipidemia, and coronary artery disease.  He denies other injuries.  The hospitalist admitted him to [name], and orthopedic consultation was placed.  His Coumadin was held.  In the emergency department, his INR was 2.7.  He was given oral vitamin K in the emergency department yesterday.  Past Medical History:  Diagnosis Date  . Allergic rhinitis   . Anemia   . Arthritis    SHOULDER  . Asthma with bronchitis   . Colon polyp   . COPD (chronic obstructive pulmonary disease) (HCC)    Astmatic bronchitis  . Dysrhythmia 08/2012   NEW ONSET ATRIAL FIBRILATION  . Gout    PMH  . Headache(784.0)   . HLD (hyperlipidemia)   . Nephrolithiasis   . Osteoporosis   . RLS (restless legs syndrome)   . Skin cancer (melanoma) ([place]) 2012   Right neck  . Skin cancer, basal cell    Dr Nevada Crane, [facility]   Past Surgical History:  Procedure Laterality Date  . AORTIC VALVE REPLACEMENT N/A 02/20/2013   Procedure: AORTIC VALVE REPLACEMENT (AVR);  Surgeon: Ivin Poot, MD;  Location: [place];  Service: Open Heart Surgery;  Laterality: N/A;  . COLONOSCOPY W/ POLYPECTOMY     Dr Deatra Ina  . CORONARY ARTERY BYPASS GRAFT N/A 02/20/2013   Procedure: CORONARY ARTERY BYPASS GRAFTING (CABG);  Surgeon: Ivin Poot, MD;  Location: [place];  Service: Open Heart Surgery;  Laterality: N/A;  . FINGER SURGERY     for foreign body  . INGUINAL HERNIA REPAIR     . INTRAOPERATIVE TRANSESOPHAGEAL ECHOCARDIOGRAM N/A 02/20/2013   Procedure: INTRAOPERATIVE TRANSESOPHAGEAL ECHOCARDIOGRAM;  Surgeon: Ivin Poot, MD;  Location: [place];  Service: Open Heart Surgery;  Laterality: N/A;  . LEFT AND RIGHT HEART CATHETERIZATION WITH CORONARY ANGIOGRAM N/A 01/05/2013   Procedure: LEFT AND RIGHT HEART CATHETERIZATION WITH CORONARY ANGIOGRAM;  Surgeon: Blane Ohara, MD;  Location: [facility] CATH LAB;  Service: Cardiovascular;  Laterality: N/A;  . LEG SURGERY  06/2010    Dr.Lawson, for blood clott. Left leg   . NECK SURGERY  12/2010   Melanoma ; [facility]   . NOSE SURGERY     Septal Deviation  . RIGHT HEART CATHETERIZATION N/A 01/23/2013   Procedure: RIGHT HEART CATH;  Surgeon: Jolaine Artist, MD;  Location: [facility] CATH LAB;  Service: Cardiovascular;  Laterality: N/A;  . TEE WITHOUT CARDIOVERSION N/A 01/23/2013   Procedure: TRANSESOPHAGEAL ECHOCARDIOGRAM (TEE);  Surgeon: Jolaine Artist, MD;  Location: [facility] ENDOSCOPY;  Service: Cardiovascular;  Laterality: N/A;  sign heart cath consent w/tee consent   Social History   Socioeconomic History  . Marital status: Married    Spouse name: Not on file  . Number of children: Not on file  . Years of education: Not on file  . Highest education level: Not on file  Occupational History  . Occupation: retired  Employer: [employer]  Tobacco Use  . Smoking status: Former Smoker    Packs/day: 3.00    Years: 15.00    Pack years: 45.00    Types: Cigarettes    Quit date: 02/19/1961    Years since quitting: 58.9  . Smokeless tobacco: Never Used  Vaping Use  . Vaping Use: Never used  Substance and Sexual Activity  . Alcohol use: No  . Drug use: No  . Sexual activity: Not on file  Other Topics Concern  . Not on file  Social History Narrative   Right handed   One story home   No caffeine   Social Determinants of Health   Financial Resource Strain:   . Difficulty of Paying Living Expenses: Not on file   Food Insecurity:   . Worried About Charity fundraiser in the Last Year: Not on file  . Ran Out of Food in the Last Year: Not on file  Transportation Needs:   . Lack of Transportation (Medical): Not on file  . Lack of Transportation (Non-Medical): Not on file  Physical Activity:   . Days of Exercise per Week: Not on file  . Minutes of Exercise per Session: Not on file  Stress:   . Feeling of Stress : Not on file  Social Connections:   . Frequency of Communication with Friends and Family: Not on file  . Frequency of Social Gatherings with Friends and Family: Not on file  . Attends Religious Services: Not on file  . Active Member of Clubs or Organizations: Not on file  . Attends Archivist Meetings: Not on file  . Marital Status: Not on file   Family History  Problem Relation Age of Onset  . Allergies Mother   . Coronary artery disease Mother   . Allergy (severe) Mother   . Allergies Father   . Stroke Father   . Heart attack Father 4  . Asthma Father   . Allergy (severe) Father   . Allergies Sister   . Allergy (severe) Sister   . Allergies Brother        x2  . Allergy (severe) Brother   . Diabetes Brother   . Allergy (severe) Brother   . Emphysema Brother        smoked  . Cancer Son    Allergies  Allergen Reactions  . Iodinated Diagnostic Agents Hives    Other reaction(s): RASH Other reaction(s): RASH  . Ioxaglate Hives    Other reaction(s): RASH   Prior to Admission medications   Medication Sig Start Date End Date Taking? Authorizing Provider  carbidopa-levodopa (SINEMET IR) 25-100 MG tablet Take 1 tablet at 8am, 1pm, and 6pm Patient taking differently: Take 1 tablet by mouth 3 (three) times daily. Take 1 tablet at 8am, 1pm, and 6pm 03/30/19  Yes Patel, Donika K, DO  diltiazem (CARDIZEM CD) 240 MG 24 hr capsule TAKE ONE CAPSULE (240MG  TOTAL) BY MOUTH DAILY Patient taking differently: Take 240 mg by mouth daily.  11/10/19  Yes Fay Records, MD   gabapentin (NEURONTIN) 100 MG capsule Take 1 capsule (100 mg total) by mouth at bedtime. 03/30/19  Yes Patel, Donika K, DO  ketotifen (ZADITOR) 0.025 % ophthalmic solution Place 1 drop into both eyes daily.    Yes [provider]  Multiple Vitamins-Minerals (DAILY MULTIVITAMIN) CAPS Take 1 capsule by mouth daily.  12/21/10  Yes [provider]  rosuvastatin (CRESTOR) 5 MG tablet Take 0.5 tablets (2.5 mg total) by  mouth every other day. 11/19/19  Yes Fay Records, MD  topiramate (TOPAMAX) 25 MG tablet Take 75 mg by mouth daily.   Yes [provider]  warfarin (JANTOVEN) 2 MG tablet TAKE ONE (1) TABLET BY MOUTH EVERY DAY OR AS DIRECTED BY COUMADIN CLINIC Patient taking differently: Take 2 mg by mouth daily.  12/08/19  Yes Fay Records, MD  busPIRone (BUSPAR) 15 MG tablet TAKE ONE TABLET BY MOUTH TWICE A DAY Annual appt is due w/labs must see provider for future refills Patient not taking: Reported on 12/29/2019 06/16/19   [name], MD  nitroGLYCERIN (NITROSTAT) 0.4 MG SL tablet Place 0.4 mg under the tongue every 5 (five) minutes as needed for chest pain.    [provider]  pantoprazole (PROTONIX) 20 MG tablet Take 1 tablet (20 mg total) by mouth daily. Take 30 minutes prior to a meal.  For heartburn Patient not taking: Reported on 01/16/2020 08/03/19   [name], MD   DG Chest 1 View  Result Date: 12/24/2019 CLINICAL DATA:  Initial evaluation for preoperative evaluation, hip fracture. EXAM: CHEST  1 VIEW COMPARISON:  Prior radiograph from 10/15/2017 FINDINGS: Median sternotomy wires underlying CABG markers and valvular prosthesis noted. Mild exaggeration of the cardiac silhouette related AP technique. Mediastinal silhouette normal. Aortic atherosclerosis. Lungs mildly hypoinflated. Associated mild patchy left basilar opacity, likely atelectasis. No focal infiltrates. Mild perihilar vascular congestion without overt pulmonary edema. No pleural effusion. No  pneumothorax. No acute osseous finding.  Osteopenia. IMPRESSION: 1. Hypoinflation with mild patchy left basilar opacity, likely atelectasis. 2. 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 3.  Aortic Atherosclerosis (ICD10-I70.0). Electronically Signed   By: Jeannine Boga M.D.   On: 12/22/2019 02:12   CT Head Wo Contrast  Result Date: 01/01/2020 CLINICAL DATA:  Fall, right cheek skin tear and right supraorbital hematoma EXAM: CT HEAD WITHOUT CONTRAST CT MAXILLOFACIAL WITHOUT CONTRAST CT CERVICAL SPINE WITHOUT CONTRAST TECHNIQUE: Multidetector CT imaging of the head, cervical spine, and maxillofacial structures were performed using the standard protocol without intravenous contrast. Multiplanar CT image reconstructions of the cervical spine and maxillofacial structures were also generated. COMPARISON:  CT head 12/17/2014, MRI 12/12/2009, paranasal sinus CT 11/04/2009 FINDINGS: CT HEAD FINDINGS Brain: No evidence of acute infarction, hemorrhage, hydrocephalus, extra-axial collection, visible mass lesion or mass effect. Diffuse prominence of the cisterns and sulci compatible with parenchymal volume loss. Ventricular dilatation is sat Lea out of proportion to the degree of sulcal prominence with slight narrowing of the callososeptal angle and upward bowing of the corpus callosum. Patchy areas of white matter hypoattenuation are most compatible with chronic microvascular angiopathy. Midline structures are normal. Cerebellar tonsils are normally position. Vascular: Atherosclerotic calcification of the carotid siphons and intradural vertebral arteries. No hyperdense vessel. Skull: Right frontal/supraorbital swelling and laceration extending to the temporal soft tissues as well. Thin crescentic scalp hematoma measuring up to 6 mm maximal thickness. No subjacent calvarial fracture or other acute osseous injury of the calvaria. Other: None. CT MAXILLOFACIAL FINDINGS Osseous: No fracture of the bony  orbits. Chronic appearing deformities of the nasal bones similar to comparison imaging in 2011. No other mid face fractures are seen. The pterygoid plates are intact. No visible or suspected temporal bone fractures. Temporomandibular joints are normally aligned. The mandible is intact. Numerous dental amalgams and metallic orthotic hardware limit evaluation of the dentition. No clearly fractured or avulsed teeth. Orbits: Some slightly asymmetric right supraorbital swelling and palpebral thickening. No retro septal  gas, stranding or hemorrhage. Bilateral lens extractions. Small amount of right sub palpebral gas is a normal finding. Sinuses: Postsurgical changes from prior antrectomies and partial ethmoidectomies. Some pneumatized secretions and thickening are present within the posterior ethmoids on the left with slightly hyperostotic changes likely reflecting an acute on chronic sinusitis. Remaining paranasal sinuses and mastoid air cells are predominantly clear. Middle ear cavities are clear. Ossicular chains are normally configured. Soft tissues: Right periorbital and supraorbital soft tissue swelling. Mild right malar contusion and laceration as well. Question some mild pre mental thickening. Soft tissues are otherwise unremarkable without further gas or foreign body. CT CERVICAL SPINE FINDINGS Alignment: Stabilization collar is absent at the time of examination slightly exaggerated cervical lordosis. Minimal anterolisthesis C2 on 3 and retrolisthesis C3 on 4 is favored to be on a degenerative basis with spondylitic changes at these levels. No evidence of traumatic listhesis. No abnormally widened, perched or jumped facets. Normal alignment of the craniocervical articulations accounting for mild rightward cranial rotation. Effacement of the atlantodental interval likely related to advanced arthrosis. Skull base and vertebrae: The osseous structures appear diffusely demineralized which may limit detection of small  or nondisplaced fractures. No visible skull base fracture is seen. No discernible fracture of the vertebral bodies or posterior elements. No worrisome lytic or blastic lesions. Multilevel cervical spondylitic changes as below. Advanced arthrosis at the atlantodental and basion dens interval including a likely degenerative os odontoideum. Soft tissues and spinal canal: No pre or paravertebral fluid or swelling. No visible canal hematoma. Some minimal enthesopathic mineralization in the nuchal ligament. Disc levels: Multilevel cervical spondylitic changes are present with diffuse intervertebral disc height loss and posterior ridging with disc osteophyte complexes. Larger complexes at C4-5 and C5-6 result in at most mild canal stenosis with only partial effacement of ventral thecal sac at the remaining cervical levels. Diffuse uncinate spurring and facet hypertrophic changes result in mild-to-moderate multilevel neural foraminal narrowing without severe foraminal impingement in the cervical spine. Upper chest: Some apical emphysematous changes and biapical pleuroparenchymal scarring. Calcification of the proximal great vessels. Cervical carotid atherosclerosis is noted. Other: No concerning thyroid nodules or masses. Diminutive appearance of the thyroid. IMPRESSION: 1. Right frontal/supraorbital swelling and laceration extending to the temporal soft tissues as well. Thin crescentic scalp hematoma measuring up to 6 mm maximal thickness. No subjacent calvarial fracture or other acute osseous injury. 2. No acute intracranial abnormality. 3. Background of chronic microvascular angiopathy and parenchymal volume loss. Ventricular dilatation is slightly out of proportion to the degree of sulcal prominence with narrowing of the callososeptal angle and upward bowing of the corpus callosum. Findings could suggest a normal pressure hydrocephalus though are similar to comparison imaging. Correlate with clinical symptoms. 4. Mild  right malar contusion and laceration. 5. No acute facial bone fracture. 6. Chronic appearing deformities of the nasal bones. 7. Postsurgical changes prior sinus surgery. Residual pneumatized secretions and thickening within the posterior ethmoids on the left with slightly hyperostotic changes likely reflecting an acute on chronic sinusitis. 8. No evidence of acute fracture or traumatic listhesis of the cervical spine. 9. Diffuse cervical spondylitic changes with multifactorial mild canal stenosis and mild-to-moderate multilevel foraminal narrowing as described above. 10. Cervical and intracranial atherosclerosis. 11.  Emphysema (ICD10-J43.9). Electronically Signed   By: Lovena Le M.D.   On: 01/05/2020 04:19   CT Cervical Spine Wo Contrast  Result Date: 12/21/2019 CLINICAL DATA:  Fall, right cheek skin tear and right supraorbital hematoma EXAM: CT HEAD WITHOUT CONTRAST CT MAXILLOFACIAL  WITHOUT CONTRAST CT CERVICAL SPINE WITHOUT CONTRAST TECHNIQUE: Multidetector CT imaging of the head, cervical spine, and maxillofacial structures were performed using the standard protocol without intravenous contrast. Multiplanar CT image reconstructions of the cervical spine and maxillofacial structures were also generated. COMPARISON:  CT head 12/17/2014, MRI 12/12/2009, paranasal sinus CT 11/04/2009 FINDINGS: CT HEAD FINDINGS Brain: No evidence of acute infarction, hemorrhage, hydrocephalus, extra-axial collection, visible mass lesion or mass effect. Diffuse prominence of the cisterns and sulci compatible with parenchymal volume loss. Ventricular dilatation is sat Lea out of proportion to the degree of sulcal prominence with slight narrowing of the callososeptal angle and upward bowing of the corpus callosum. Patchy areas of white matter hypoattenuation are most compatible with chronic microvascular angiopathy. Midline structures are normal. Cerebellar tonsils are normally position. Vascular: Atherosclerotic calcification  of the carotid siphons and intradural vertebral arteries. No hyperdense vessel. Skull: Right frontal/supraorbital swelling and laceration extending to the temporal soft tissues as well. Thin crescentic scalp hematoma measuring up to 6 mm maximal thickness. No subjacent calvarial fracture or other acute osseous injury of the calvaria. Other: None. CT MAXILLOFACIAL FINDINGS Osseous: No fracture of the bony orbits. Chronic appearing deformities of the nasal bones similar to comparison imaging in 2011. No other mid face fractures are seen. The pterygoid plates are intact. No visible or suspected temporal bone fractures. Temporomandibular joints are normally aligned. The mandible is intact. Numerous dental amalgams and metallic orthotic hardware limit evaluation of the dentition. No clearly fractured or avulsed teeth. Orbits: Some slightly asymmetric right supraorbital swelling and palpebral thickening. No retro septal gas, stranding or hemorrhage. Bilateral lens extractions. Small amount of right sub palpebral gas is a normal finding. Sinuses: Postsurgical changes from prior antrectomies and partial ethmoidectomies. Some pneumatized secretions and thickening are present within the posterior ethmoids on the left with slightly hyperostotic changes likely reflecting an acute on chronic sinusitis. Remaining paranasal sinuses and mastoid air cells are predominantly clear. Middle ear cavities are clear. Ossicular chains are normally configured. Soft tissues: Right periorbital and supraorbital soft tissue swelling. Mild right malar contusion and laceration as well. Question some mild pre mental thickening. Soft tissues are otherwise unremarkable without further gas or foreign body. CT CERVICAL SPINE FINDINGS Alignment: Stabilization collar is absent at the time of examination slightly exaggerated cervical lordosis. Minimal anterolisthesis C2 on 3 and retrolisthesis C3 on 4 is favored to be on a degenerative basis with  spondylitic changes at these levels. No evidence of traumatic listhesis. No abnormally widened, perched or jumped facets. Normal alignment of the craniocervical articulations accounting for mild rightward cranial rotation. Effacement of the atlantodental interval likely related to advanced arthrosis. Skull base and vertebrae: The osseous structures appear diffusely demineralized which may limit detection of small or nondisplaced fractures. No visible skull base fracture is seen. No discernible fracture of the vertebral bodies or posterior elements. No worrisome lytic or blastic lesions. Multilevel cervical spondylitic changes as below. Advanced arthrosis at the atlantodental and basion dens interval including a likely degenerative os odontoideum. Soft tissues and spinal canal: No pre or paravertebral fluid or swelling. No visible canal hematoma. Some minimal enthesopathic mineralization in the nuchal ligament. Disc levels: Multilevel cervical spondylitic changes are present with diffuse intervertebral disc height loss and posterior ridging with disc osteophyte complexes. Larger complexes at C4-5 and C5-6 result in at most mild canal stenosis with only partial effacement of ventral thecal sac at the remaining cervical levels. Diffuse uncinate spurring and facet hypertrophic changes result in mild-to-moderate multilevel neural  foraminal narrowing without severe foraminal impingement in the cervical spine. Upper chest: Some apical emphysematous changes and biapical pleuroparenchymal scarring. Calcification of the proximal great vessels. Cervical carotid atherosclerosis is noted. Other: No concerning thyroid nodules or masses. Diminutive appearance of the thyroid. IMPRESSION: 1. Right frontal/supraorbital swelling and laceration extending to the temporal soft tissues as well. Thin crescentic scalp hematoma measuring up to 6 mm maximal thickness. No subjacent calvarial fracture or other acute osseous injury. 2. No acute  intracranial abnormality. 3. Background of chronic microvascular angiopathy and parenchymal volume loss. Ventricular dilatation is slightly out of proportion to the degree of sulcal prominence with narrowing of the callososeptal angle and upward bowing of the corpus callosum. Findings could suggest a normal pressure hydrocephalus though are similar to comparison imaging. Correlate with clinical symptoms. 4. Mild right malar contusion and laceration. 5. No acute facial bone fracture. 6. Chronic appearing deformities of the nasal bones. 7. Postsurgical changes prior sinus surgery. Residual pneumatized secretions and thickening within the posterior ethmoids on the left with slightly hyperostotic changes likely reflecting an acute on chronic sinusitis. 8. No evidence of acute fracture or traumatic listhesis of the cervical spine. 9. Diffuse cervical spondylitic changes with multifactorial mild canal stenosis and mild-to-moderate multilevel foraminal narrowing as described above. 10. Cervical and intracranial atherosclerosis. 11.  Emphysema (ICD10-J43.9). Electronically Signed   By: Lovena Le M.D.   On: 01/03/2020 04:19   DG Knee Right Port  Result Date: 12/31/2019 CLINICAL DATA:  Closed comminuted intertrochanteric femur fracture. EXAM: PORTABLE RIGHT KNEE - 1-2 VIEW COMPARISON:  Hip radiography from earlier today FINDINGS: Negative for knee subluxation or fracture. Osteopenia. Extensive arterial calcification. IMPRESSION: No acute finding. Electronically Signed   By: Monte Fantasia M.D.   On: 01/18/2020 09:09   DG Hip Unilat W or Wo Pelvis 2-3 Views Right  Result Date: 12/30/2019 CLINICAL DATA:  Initial evaluation for acute trauma, fall. EXAM: DG HIP (WITH OR WITHOUT PELVIS) 2-3V RIGHT COMPARISON:  None. FINDINGS: Acute comminuted intratrochanteric fracture of the right hip with superior subluxation. Bony pelvis grossly intact. Limited views of the left hip demonstrate no acute finding. Underlying  osteopenia. Prominent vascular calcifications about the proximal thighs. Degenerative change noted within the lower lumbar spine. IMPRESSION: Acute comminuted intertrochanteric fracture of the right hip. Electronically Signed   By: Jeannine Boga M.D.   On: 01/07/2020 02:10   CT Maxillofacial WO CM  Result Date: 12/28/2019 CLINICAL DATA:  Fall, right cheek skin tear and right supraorbital hematoma EXAM: CT HEAD WITHOUT CONTRAST CT MAXILLOFACIAL WITHOUT CONTRAST CT CERVICAL SPINE WITHOUT CONTRAST TECHNIQUE: Multidetector CT imaging of the head, cervical spine, and maxillofacial structures were performed using the standard protocol without intravenous contrast. Multiplanar CT image reconstructions of the cervical spine and maxillofacial structures were also generated. COMPARISON:  CT head 12/17/2014, MRI 12/12/2009, paranasal sinus CT 11/04/2009 FINDINGS: CT HEAD FINDINGS Brain: No evidence of acute infarction, hemorrhage, hydrocephalus, extra-axial collection, visible mass lesion or mass effect. Diffuse prominence of the cisterns and sulci compatible with parenchymal volume loss. Ventricular dilatation is sat Lea out of proportion to the degree of sulcal prominence with slight narrowing of the callososeptal angle and upward bowing of the corpus callosum. Patchy areas of white matter hypoattenuation are most compatible with chronic microvascular angiopathy. Midline structures are normal. Cerebellar tonsils are normally position. Vascular: Atherosclerotic calcification of the carotid siphons and intradural vertebral arteries. No hyperdense vessel. Skull: Right frontal/supraorbital swelling and laceration extending to the temporal soft tissues as well. Thin crescentic  scalp hematoma measuring up to 6 mm maximal thickness. No subjacent calvarial fracture or other acute osseous injury of the calvaria. Other: None. CT MAXILLOFACIAL FINDINGS Osseous: No fracture of the bony orbits. Chronic appearing deformities  of the nasal bones similar to comparison imaging in 2011. No other mid face fractures are seen. The pterygoid plates are intact. No visible or suspected temporal bone fractures. Temporomandibular joints are normally aligned. The mandible is intact. Numerous dental amalgams and metallic orthotic hardware limit evaluation of the dentition. No clearly fractured or avulsed teeth. Orbits: Some slightly asymmetric right supraorbital swelling and palpebral thickening. No retro septal gas, stranding or hemorrhage. Bilateral lens extractions. Small amount of right sub palpebral gas is a normal finding. Sinuses: Postsurgical changes from prior antrectomies and partial ethmoidectomies. Some pneumatized secretions and thickening are present within the posterior ethmoids on the left with slightly hyperostotic changes likely reflecting an acute on chronic sinusitis. Remaining paranasal sinuses and mastoid air cells are predominantly clear. Middle ear cavities are clear. Ossicular chains are normally configured. Soft tissues: Right periorbital and supraorbital soft tissue swelling. Mild right malar contusion and laceration as well. Question some mild pre mental thickening. Soft tissues are otherwise unremarkable without further gas or foreign body. CT CERVICAL SPINE FINDINGS Alignment: Stabilization collar is absent at the time of examination slightly exaggerated cervical lordosis. Minimal anterolisthesis C2 on 3 and retrolisthesis C3 on 4 is favored to be on a degenerative basis with spondylitic changes at these levels. No evidence of traumatic listhesis. No abnormally widened, perched or jumped facets. Normal alignment of the craniocervical articulations accounting for mild rightward cranial rotation. Effacement of the atlantodental interval likely related to advanced arthrosis. Skull base and vertebrae: The osseous structures appear diffusely demineralized which may limit detection of small or nondisplaced fractures. No visible  skull base fracture is seen. No discernible fracture of the vertebral bodies or posterior elements. No worrisome lytic or blastic lesions. Multilevel cervical spondylitic changes as below. Advanced arthrosis at the atlantodental and basion dens interval including a likely degenerative os odontoideum. Soft tissues and spinal canal: No pre or paravertebral fluid or swelling. No visible canal hematoma. Some minimal enthesopathic mineralization in the nuchal ligament. Disc levels: Multilevel cervical spondylitic changes are present with diffuse intervertebral disc height loss and posterior ridging with disc osteophyte complexes. Larger complexes at C4-5 and C5-6 result in at most mild canal stenosis with only partial effacement of ventral thecal sac at the remaining cervical levels. Diffuse uncinate spurring and facet hypertrophic changes result in mild-to-moderate multilevel neural foraminal narrowing without severe foraminal impingement in the cervical spine. Upper chest: Some apical emphysematous changes and biapical pleuroparenchymal scarring. Calcification of the proximal great vessels. Cervical carotid atherosclerosis is noted. Other: No concerning thyroid nodules or masses. Diminutive appearance of the thyroid. IMPRESSION: 1. Right frontal/supraorbital swelling and laceration extending to the temporal soft tissues as well. Thin crescentic scalp hematoma measuring up to 6 mm maximal thickness. No subjacent calvarial fracture or other acute osseous injury. 2. No acute intracranial abnormality. 3. Background of chronic microvascular angiopathy and parenchymal volume loss. Ventricular dilatation is slightly out of proportion to the degree of sulcal prominence with narrowing of the callososeptal angle and upward bowing of the corpus callosum. Findings could suggest a normal pressure hydrocephalus though are similar to comparison imaging. Correlate with clinical symptoms. 4. Mild right malar contusion and laceration.  5. No acute facial bone fracture. 6. Chronic appearing deformities of the nasal bones. 7. Postsurgical changes prior sinus surgery. Residual  pneumatized secretions and thickening within the posterior ethmoids on the left with slightly hyperostotic changes likely reflecting an acute on chronic sinusitis. 8. No evidence of acute fracture or traumatic listhesis of the cervical spine. 9. Diffuse cervical spondylitic changes with multifactorial mild canal stenosis and mild-to-moderate multilevel foraminal narrowing as described above. 10. Cervical and intracranial atherosclerosis. 11.  Emphysema (ICD10-J43.9). Electronically Signed   By: Lovena Le M.D.   On: 01/03/2020 04:19    Positive ROS: All other systems have been reviewed and were otherwise negative with the exception of those mentioned in the HPI and as above.  Physical Exam: General: Alert, no acute distress Cardiovascular: No pedal edema Respiratory: No cyanosis, no use of accessory musculature GI: No organomegaly, abdomen is soft and non-tender Skin: No lesions in the area of chief complaint Neurologic: Sensation intact distally Psychiatric: Patient is competent for consent with normal mood and affect Lymphatic: No axillary or cervical lymphadenopathy  MUSCULOSKELETAL: Examination of the right lower extremity reveals no skin wounds or lesions.  He is shortened and rotated.  He has pain with attempted logrolling of the hip.  He has positive motor function dorsiflexion, plantarflexion, and great toe extension.  He reports intact sensation.  I am unable to palpate pedal pulses, however his foot is well-perfused.  Assessment: Displaced right intertrochanteric femur fracture. Chronic Coumadin usage.  Plan: I discussed the findings with the patient and his wife.  His hip injury requires surgical stabilization.  We discussed the risk, benefits, and alternatives to intramedullary fixation of the right femur.  Please see statement of risk.  INR  is appropriate today at 1.7.  We will plan to proceed with surgery today.  All questions were solicited and answered.  The risks, benefits, and alternatives were discussed with the patient. There are risks associated with the surgery including, but not limited to, problems with anesthesia (death), infection, differences in leg length/angulation/rotation, fracture of bones, loosening or failure of implants, malunion, nonunion, hematoma (blood accumulation) which may require surgical drainage, blood clots, pulmonary embolism, nerve injury (foot drop), and blood vessel injury. The patient understands these risks and elects to proceed.   Bertram Savin, MD [phone]    01/09/2020 8:17 AM

## 2020-01-10 NOTE — Anesthesia Preprocedure Evaluation (Addendum)
Anesthesia Evaluation  Patient identified by MRN, date of birth, ID band Patient awake    Reviewed: Allergy & Precautions, NPO status , Patient's Chart, lab work & pertinent test results  History of Anesthesia Complications Negative for: history of anesthetic complications  Airway Mallampati: II  TM Distance: >3 FB Neck ROM: Full    Dental  (+) Dental Advisory Given   Pulmonary neg pulmonary ROS, asthma , COPD, former smoker,    breath sounds clear to auscultation       Cardiovascular + CAD, + CABG and + Peripheral Vascular Disease  negative cardio ROS  + dysrhythmias  Rhythm:Regular  1. The left ventricle has normal systolic function with an ejection  fraction of 60-65%. The cavity size was normal. There is mildly increased  left ventricular wall thickness. Left ventricular diastolic function could  not be evaluated.  2. Left atrial size was severely dilated.  3. Right atrial size was moderately dilated.  4. The mitral valve is abnormal. There is severe mitral annular  calcification present. Mitral valve regurgitation is moderate by color  flow Doppler. Mild mitral valve stenosis.  5. The pulmonic valve was grossly normal. Pulmonic valve regurgitation is  not visualized by color flow Doppler.  6. The aorta is abnormal unless otherwise noted.  7. There is mild dilatation of the ascending aorta measuring 39 mm.   S/pCABG AVR   Neuro/Psych  Headaches, PSYCHIATRIC DISORDERS Anxiety negative neurological ROS  negative psych ROS   GI/Hepatic negative GI ROS, Neg liver ROS, GERD  ,  Endo/Other  negative endocrine ROSdiabetes  Renal/GU CRFRenal diseasenegative Renal ROSLab Results      Component                Value               Date                      CREATININE               1.59 (H)            12/24/2019                Musculoskeletal  (+) Arthritis , fracture right femur   Abdominal   Peds   Hematology negative hematology ROS (+) Blood dyscrasia, anemia , Lab Results      Component                Value               Date                      WBC                      11.4 (H)            01/07/2020                HGB                      8.8 (L)             01/17/2020                HCT                      27.1 (L)  01/14/2020                MCV                      92.5                01/18/2020                PLT                      184                 01/15/2020            Lab Results      Component                Value               Date                      INR                      1.7 (H)             12/30/2019                INR                      2.7 (H)             01/07/2020                INR                      2.4                 12/10/2019             COumadin for afib   Anesthesia Other Findings   Reproductive/Obstetrics (+) Pregnancy                            Anesthesia Physical Anesthesia Plan  ASA: III  Anesthesia Plan: General   Post-op Pain Management:    Induction: Intravenous  PONV Risk Score and Plan: 2 and Ondansetron and Dexamethasone  Airway Management Planned: Oral ETT  Additional Equipment: None  Intra-op Plan:   Post-operative Plan: Extubation in OR  Informed Consent: I have reviewed the patients History and Physical, chart, labs and discussed the procedure including the risks, benefits and alternatives for the proposed anesthesia with the patient or authorized representative who has indicated his/her understanding and acceptance.     Dental advisory given and Consent reviewed with POA  Plan Discussed with: CRNA and Surgeon  Anesthesia Plan Comments:        Anesthesia Quick Evaluation

## 2020-01-10 NOTE — Op Note (Signed)
OPERATIVE REPORT  SURGEON: Rod Can, MD   ASSISTANT: Staff.  PREOPERATIVE DIAGNOSIS: Right intertrochanteric femur fracture.   POSTOPERATIVE DIAGNOSIS: Right intertrochanteric femur fracture.   PROCEDURE: Intramedullary fixation, Right femur.   IMPLANTS: Biomet Affixus Hip Fracture Nail, 11 by 180 mm, 125 degrees. 10.5 x 95 mm Hip Fracture Nail Lag Screw. 5 x 38 mm distal interlocking screw 1.  ANESTHESIA:  General  ESTIMATED BLOOD LOSS:-50 mL    ANTIBIOTICS: 2 g Ancef.  DRAINS: None.  COMPLICATIONS: None.   CONDITION: PACU - hemodynamically stable.   BRIEF CLINICAL NOTE: Robert Richard is a 87 y.o. male who presented with an intertrochanteric femur fracture. The patient was admitted to the hospitalist service and underwent perioperative risk stratification and medical optimization. The risks, benefits, and alternatives to the procedure were explained, and the patient elected to proceed.  PROCEDURE IN DETAIL: Surgical site was marked by myself. The patient was taken to the operating room and anesthesia was induced on the bed. The patient was then transferred to the [facility] table and the nonoperative lower extremity was scissored underneath the operative side. The fracture was reduced with traction, internal rotation, and adduction. The hip was prepped and draped in the normal sterile surgical fashion. Timeout was called verifying side and site of surgery. Preop antibiotics were given with 60 minutes of beginning the procedure.  Fluoroscopy was used to define the patient's anatomy. A 4 cm incision was made just proximal to the tip of the greater trochanter. The awl was used to obtain the standard starting point for a trochanteric entry nail under fluoroscopic control. The guidepin was placed. The entry reamer was used to open the proximal femur.  On the back table, the nail was assembled onto the jig. The nail was placed into the femur without any difficulty. Through a separate  stab incision, the cannula was placed down to the bone in preparation for the cephalomedullary device. A guidepin was placed into the femoral head using AP and lateral fluoroscopy views. The pin was measured, and then reaming was performed to the appropriate depth. The lag screw was inserted to the appropriate depth. The fracture was compressed through the jig. The setscrew was tightened and then loosened one quarter turn. A separate stab incision was created, and the distal interlocking screw was placed using standard AO technique. The jig was removed. Final AP and lateral fluoroscopy views were obtained to confirm fracture reduction and hardware placement. Tip apex distance was appropriate. There was no chondral penetration.  The wounds were copiously irrigated with saline. The wound was closed in layers with #1 Vicryl for the fascia, 2-0 Monocryl for the deep dermal layer, and staples for the skin. Glue was applied to the skin. Once the glue was fully hardened, sterile dressing was applied. The patient was then awakened from anesthesia and taken to the PACU in stable condition. Sponge needle and instrument counts were correct at the end of the case 2. There were no known complications.  We will readmit the patient to the hospitalist. Weightbearing status will be weightbearing as tolerated with a walker. We will begin coumadin with Lovenox bridge for DVT prophylaxis. The patient will work with physical therapy and undergo disposition planning.

## 2020-01-10 NOTE — Transfer of Care (Signed)
Immediate Anesthesia Transfer of Care Note  Patient: Robert Richard  Procedure(s) Performed: INTRAMEDULLARY (IM) NAIL INTERTROCHANTRIC (Right Hip)  Patient Location: PACU  Anesthesia Type:General  Level of Consciousness: awake, alert  and oriented  Airway & Oxygen Therapy: Patient Spontanous Breathing and Patient connected to face mask oxygen  Post-op Assessment: Report given to RN and Post -op Vital signs reviewed and stable  Post vital signs: Reviewed and stable  Last Vitals:  Vitals Value Taken Time  BP 112/66 01/06/2020 1000  Temp    Pulse 82 12/22/2019 1001  Resp 18 01/19/2020 1001  SpO2 99 % 01/02/2020 1001  Vitals shown include unvalidated device data.  Last Pain:  Vitals:   01/03/2020 0015  TempSrc: Oral  PainSc:          Complications: No complications documented.

## 2020-01-10 NOTE — Progress Notes (Signed)
[facility]                                                                              Patient Demographics  Robert Richard, is a 86 y.o. male, DOB - 04-01-1930, JKD:[id number]  Admit date - 12/31/2019   Admitting Physician Bernadette Hoit, DO  Outpatient Primary MD for the patient is [name], MD  Outpatient specialists:   LOS - 1  days   Medical records reviewed and are as summarized below:    Chief Complaint  Patient presents with  . Fall  . Leg Injury       Brief summary   Patient is 86yo patient with CAD, hyperlipidemia, A. fib on warfarin, GERD, CKD stage III, Parkinson's disease presented to ED after sustaining a fall at home.  History per his wife, patient had gotten out of the shower and while he was in the bathroom, he fell and landed on the right side of his face, right hip.  Patient sustained facial laceration with scalp contusion.  He also complained of right hip pain with difficulty in being able to bear weight on the right leg due to pain.  EMS was called. In ED BP was soft-2/52, and leukocytosis with normocytic anemia.  Creatinine 1.4.  CT imaging showed no acute intracranial abnormality but showed right frontal/supraorbital swelling and laceration extending to the temporal soft tissues, mild right liver contusion and laceration.  No facial bone fracture. Hip x-ray showed right hip fracture Patient was transferred to [facility] for further work-up  Assessment & Plan    Principal Problem: Acute comminuted closed right hip fracture, after mechanical fall at home [facility]) -Right hip x-ray showed acute comminuted intertrochanteric fracture of the right hip with superior subluxation -Patient was admitted, placed on pain control and orthopedics was consulted -Underwent intramedullary fixation of the right femur by Dr. [name] today -Patient seen after surgery, no acute complaints, wife at the bedside -Continue pain control, will start PT -Per  orthopedics, resume Coumadin, follow H&H  Active Problems: Right frontal/supraorbital swelling and laceration -Continue wound care, no active bleeding   Leukocytosis possibly reactive WBC 16.4 at the time of admission, no obvious signs of any acute infectious process  -WBCs 11.4, likely reactive  Diabetes mellitus type 2, diet controlled -Hemoglobin A1c 6.5 08/09/2019 -Continue carb modified diet   CKD stage IIIa -Baseline creatinine 1.5 -Avoid nephrotoxic medications, hypotension.  Creatinine currently at baseline   Chronic atrial fibrillation -BP currently soft, continue to hold diltiazem -Resume Coumadin per clearance from orthopedics   CAD, hyperlipidemia Continue Crestor  GERD -Continue Protonix   Parkinson's disease Continue Sinemet  Underweight, protein calorie malnutrition Estimated body mass index is 18.62 kg/m as calculated from the following:   Height as of this encounter: 6\' 2"  (1.88 m).   Weight as of this encounter: 65.8 kg.  Nutritional supplements  Code Status: Full code DVT Prophylaxis: Coumadin Family Communication: Discussed all imaging results, lab results, explained to the patient's wife at the bedside   Disposition Plan:     Status is: Inpatient  Remains inpatient appropriate because:Inpatient level of care appropriate due to severity of  illness   Dispo: The patient is from: Home              Anticipated d/c is to: TBD              Anticipated d/c date is: 3 days              Patient currently is not medically stable to d/c.      Time Spent in minutes   35 minutes  Procedures:  Intramedullary right femur fracture repair on 11/21  Consultants:   Orthopedics  Antimicrobials:   Anti-infectives (From admission, onward)   Start     Dose/Rate Route Frequency Ordered Stop   01/02/2020 1500  ceFAZolin (ANCEF) IVPB 2g/100 mL premix        2 g 200 mL/hr over 30 Minutes Intravenous Every 6 hours 01/13/2020 1113 01/11/20 0259    01/17/2020 0730  ceFAZolin (ANCEF) 2-4 GM/100ML-% IVPB       Note to Pharmacy: Ward, Christa   : cabinet override      01/11/2020 0730 01/08/2020 1944         Medications  Scheduled Meds: . carbidopa-levodopa  1 tablet Oral TID  . Chlorhexidine Gluconate Cloth  6 each Topical Daily  . diltiazem  240 mg Oral Daily  . docusate sodium  100 mg Oral BID  . [START ON 01/11/2020] enoxaparin (LOVENOX) injection  30 mg Subcutaneous Q12H  . fentaNYL      . gabapentin  100 mg Oral QHS  . ketotifen  1 drop Both Eyes Daily  . multivitamin with minerals   Oral Daily  . pantoprazole  40 mg Oral Daily  . rosuvastatin  2.5 mg Oral QODAY  . topiramate  75 mg Oral Daily  . warfarin  2 mg Oral ONCE-1600  . Warfarin - Pharmacist Dosing Inpatient   Does not apply q1600   Continuous Infusions: . sodium chloride    . acetaminophen    . ceFAZolin    .  ceFAZolin (ANCEF) IV     PRN Meds:.menthol-cetylpyridinium **OR** phenol, metoCLOPramide **OR** metoCLOPramide (REGLAN) injection, morphine injection, ondansetron **OR** ondansetron (ZOFRAN) IV      Subjective:   [name] was seen and examined today.  Seen after the surgery, wife at the bedside.  No acute complaints.  Patient still somnolent from anesthesia.  No fevers or chills Objective:   Vitals:   12/31/2019 1015 01/08/2020 1030 01/13/2020 1045 01/15/2020 1111  BP: 120/85 105/61 (!) 90/58 (!) 105/56  Pulse: 99 (!) 59 (!) 48 63  Resp: 17 (!) 9 (!) 22 20  Temp:   97.9 F (36.6 C) 98.4 F (36.9 C)  TempSrc:    Axillary  SpO2: 97% 96% 95% 98%  Weight:      Height:        Intake/Output Summary (Last 24 hours) at 01/07/2020 1158 Last data filed at 01/14/2020 1002 Gross per 24 hour  Intake 1350 ml  Output 875 ml  Net 475 ml     Wt Readings from Last 3 Encounters:  12/30/2019 65.8 kg  10/23/19 61.2 kg  08/03/19 63.7 kg     Exam  General: Somnolent from surgery  Cardiovascular: S1 S2 auscultated, no murmurs, RRR  Respiratory:  Clear to auscultation bilaterally, no wheezing, rales or rhonchi  Gastrointestinal: Soft, nontender, nondistended, + bowel sounds  Ext: no pedal edema bilaterally  Neuro: somnolent  Musculoskeletal: No digital cyanosis, clubbing  Skin: No rashes  Psych: somnolent   Data Reviewed:  I  have personally reviewed following labs and imaging studies  Micro Results Recent Results (from the past 240 hour(s))  Resp Panel by RT-PCR (Flu A&B, Covid) Nasopharyngeal Swab     Status: None   Collection Time: 01/14/2020 11:50 PM   Specimen: Nasopharyngeal Swab; Nasopharyngeal(NP) swabs in vial transport medium  Result Value Ref Range Status   SARS Coronavirus 2 by RT PCR NEGATIVE NEGATIVE Final    Comment: (NOTE) SARS-CoV-2 target nucleic acids are NOT DETECTED.  The SARS-CoV-2 RNA is generally detectable in upper respiratory specimens during the acute phase of infection. The lowest concentration of SARS-CoV-2 viral copies this assay can detect is 138 copies/mL. A negative result does not preclude SARS-Cov-2 infection and should not be used as the sole basis for treatment or other patient management decisions. A negative result may occur with  improper specimen collection/handling, submission of specimen other than nasopharyngeal swab, presence of viral mutation(s) within the areas targeted by this assay, and inadequate number of viral copies(<138 copies/mL). A negative result must be combined with clinical observations, patient history, and epidemiological information. The expected result is Negative.  Fact Sheet for Patients:  EntrepreneurPulse.com.au  Fact Sheet for Healthcare Providers:  IncredibleEmployment.be  This test is no t yet approved or cleared by the [place] FDA and  has been authorized for detection and/or diagnosis of SARS-CoV-2 by FDA under an Emergency Use Authorization (EUA). This EUA will remain  in effect (meaning this test  can be used) for the duration of the COVID-19 declaration under Section 564(b)(1) of the Act, 21 U.S.C.section 360bbb-3(b)(1), unless the authorization is terminated  or revoked sooner.       Influenza A by PCR NEGATIVE NEGATIVE Final   Influenza B by PCR NEGATIVE NEGATIVE Final    Comment: (NOTE) The Xpert Xpress SARS-CoV-2/FLU/RSV plus assay is intended as an aid in the diagnosis of influenza from Nasopharyngeal swab specimens and should not be used as a sole basis for treatment. Nasal washings and aspirates are unacceptable for Xpert Xpress SARS-CoV-2/FLU/RSV testing.  Fact Sheet for Patients: EntrepreneurPulse.com.au  Fact Sheet for Healthcare Providers: IncredibleEmployment.be  This test is not yet approved or cleared by the [place] FDA and has been authorized for detection and/or diagnosis of SARS-CoV-2 by FDA under an Emergency Use Authorization (EUA). This EUA will remain in effect (meaning this test can be used) for the duration of the COVID-19 declaration under Section 564(b)(1) of the Act, 21 U.S.C. section 360bbb-3(b)(1), unless the authorization is terminated or revoked.  Performed at [facility], [address], [place], [place] [address]   Surgical pcr screen     Status: None   Collection Time: 01/18/2020  9:02 PM   Specimen: Nasal Mucosa; Nasal Swab  Result Value Ref Range Status   MRSA, PCR NEGATIVE NEGATIVE Final   Staphylococcus aureus NEGATIVE NEGATIVE Final    Comment: (NOTE) The Xpert SA Assay (FDA approved for NASAL specimens in patients 67 years of age and older), is one component of a comprehensive surveillance program. It is not intended to diagnose infection nor to guide or monitor treatment. Performed at [facility], [place] [address], [place], [place] [address]     Radiology Reports DG Chest 1 View  Result Date: 12/25/2019 CLINICAL DATA:  Initial evaluation for preoperative  evaluation, hip fracture. EXAM: CHEST  1 VIEW COMPARISON:  Prior radiograph from 10/15/2017 FINDINGS: Median sternotomy wires underlying CABG markers and valvular prosthesis noted. Mild exaggeration of the cardiac silhouette related AP technique. Mediastinal silhouette  normal. Aortic atherosclerosis. Lungs mildly hypoinflated. Associated mild patchy left basilar opacity, likely atelectasis. No focal infiltrates. Mild perihilar vascular congestion without overt pulmonary edema. No pleural effusion. No pneumothorax. No acute osseous finding.  Osteopenia. IMPRESSION: 1. Hypoinflation with mild patchy left basilar opacity, likely atelectasis. 2. 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 3.  Aortic Atherosclerosis (ICD10-I70.0). Electronically Signed   By: Jeannine Boga M.D.   On: 12/25/2019 02:12   CT Head Wo Contrast  Result Date: 01/17/2020 CLINICAL DATA:  Fall, right cheek skin tear and right supraorbital hematoma EXAM: CT HEAD WITHOUT CONTRAST CT MAXILLOFACIAL WITHOUT CONTRAST CT CERVICAL SPINE WITHOUT CONTRAST TECHNIQUE: Multidetector CT imaging of the head, cervical spine, and maxillofacial structures were performed using the standard protocol without intravenous contrast. Multiplanar CT image reconstructions of the cervical spine and maxillofacial structures were also generated. COMPARISON:  CT head 12/17/2014, MRI 12/12/2009, paranasal sinus CT 11/04/2009 FINDINGS: CT HEAD FINDINGS Brain: No evidence of acute infarction, hemorrhage, hydrocephalus, extra-axial collection, visible mass lesion or mass effect. Diffuse prominence of the cisterns and sulci compatible with parenchymal volume loss. Ventricular dilatation is sat Lea out of proportion to the degree of sulcal prominence with slight narrowing of the callososeptal angle and upward bowing of the corpus callosum. Patchy areas of white matter hypoattenuation are most compatible with chronic microvascular angiopathy. Midline  structures are normal. Cerebellar tonsils are normally position. Vascular: Atherosclerotic calcification of the carotid siphons and intradural vertebral arteries. No hyperdense vessel. Skull: Right frontal/supraorbital swelling and laceration extending to the temporal soft tissues as well. Thin crescentic scalp hematoma measuring up to 6 mm maximal thickness. No subjacent calvarial fracture or other acute osseous injury of the calvaria. Other: None. CT MAXILLOFACIAL FINDINGS Osseous: No fracture of the bony orbits. Chronic appearing deformities of the nasal bones similar to comparison imaging in 2011. No other mid face fractures are seen. The pterygoid plates are intact. No visible or suspected temporal bone fractures. Temporomandibular joints are normally aligned. The mandible is intact. Numerous dental amalgams and metallic orthotic hardware limit evaluation of the dentition. No clearly fractured or avulsed teeth. Orbits: Some slightly asymmetric right supraorbital swelling and palpebral thickening. No retro septal gas, stranding or hemorrhage. Bilateral lens extractions. Small amount of right sub palpebral gas is a normal finding. Sinuses: Postsurgical changes from prior antrectomies and partial ethmoidectomies. Some pneumatized secretions and thickening are present within the posterior ethmoids on the left with slightly hyperostotic changes likely reflecting an acute on chronic sinusitis. Remaining paranasal sinuses and mastoid air cells are predominantly clear. Middle ear cavities are clear. Ossicular chains are normally configured. Soft tissues: Right periorbital and supraorbital soft tissue swelling. Mild right malar contusion and laceration as well. Question some mild pre mental thickening. Soft tissues are otherwise unremarkable without further gas or foreign body. CT CERVICAL SPINE FINDINGS Alignment: Stabilization collar is absent at the time of examination slightly exaggerated cervical lordosis. Minimal  anterolisthesis C2 on 3 and retrolisthesis C3 on 4 is favored to be on a degenerative basis with spondylitic changes at these levels. No evidence of traumatic listhesis. No abnormally widened, perched or jumped facets. Normal alignment of the craniocervical articulations accounting for mild rightward cranial rotation. Effacement of the atlantodental interval likely related to advanced arthrosis. Skull base and vertebrae: The osseous structures appear diffusely demineralized which may limit detection of small or nondisplaced fractures. No visible skull base fracture is seen. No discernible fracture of the vertebral bodies or posterior elements. No worrisome lytic or blastic  lesions. Multilevel cervical spondylitic changes as below. Advanced arthrosis at the atlantodental and basion dens interval including a likely degenerative os odontoideum. Soft tissues and spinal canal: No pre or paravertebral fluid or swelling. No visible canal hematoma. Some minimal enthesopathic mineralization in the nuchal ligament. Disc levels: Multilevel cervical spondylitic changes are present with diffuse intervertebral disc height loss and posterior ridging with disc osteophyte complexes. Larger complexes at C4-5 and C5-6 result in at most mild canal stenosis with only partial effacement of ventral thecal sac at the remaining cervical levels. Diffuse uncinate spurring and facet hypertrophic changes result in mild-to-moderate multilevel neural foraminal narrowing without severe foraminal impingement in the cervical spine. Upper chest: Some apical emphysematous changes and biapical pleuroparenchymal scarring. Calcification of the proximal great vessels. Cervical carotid atherosclerosis is noted. Other: No concerning thyroid nodules or masses. Diminutive appearance of the thyroid. IMPRESSION: 1. Right frontal/supraorbital swelling and laceration extending to the temporal soft tissues as well. Thin crescentic scalp hematoma measuring up to 6  mm maximal thickness. No subjacent calvarial fracture or other acute osseous injury. 2. No acute intracranial abnormality. 3. Background of chronic microvascular angiopathy and parenchymal volume loss. Ventricular dilatation is slightly out of proportion to the degree of sulcal prominence with narrowing of the callososeptal angle and upward bowing of the corpus callosum. Findings could suggest a normal pressure hydrocephalus though are similar to comparison imaging. Correlate with clinical symptoms. 4. Mild right malar contusion and laceration. 5. No acute facial bone fracture. 6. Chronic appearing deformities of the nasal bones. 7. Postsurgical changes prior sinus surgery. Residual pneumatized secretions and thickening within the posterior ethmoids on the left with slightly hyperostotic changes likely reflecting an acute on chronic sinusitis. 8. No evidence of acute fracture or traumatic listhesis of the cervical spine. 9. Diffuse cervical spondylitic changes with multifactorial mild canal stenosis and mild-to-moderate multilevel foraminal narrowing as described above. 10. Cervical and intracranial atherosclerosis. 11.  Emphysema (ICD10-J43.9). Electronically Signed   By: Lovena Le M.D.   On: 12/28/2019 04:19   CT Cervical Spine Wo Contrast  Result Date: 01/01/2020 CLINICAL DATA:  Fall, right cheek skin tear and right supraorbital hematoma EXAM: CT HEAD WITHOUT CONTRAST CT MAXILLOFACIAL WITHOUT CONTRAST CT CERVICAL SPINE WITHOUT CONTRAST TECHNIQUE: Multidetector CT imaging of the head, cervical spine, and maxillofacial structures were performed using the standard protocol without intravenous contrast. Multiplanar CT image reconstructions of the cervical spine and maxillofacial structures were also generated. COMPARISON:  CT head 12/17/2014, MRI 12/12/2009, paranasal sinus CT 11/04/2009 FINDINGS: CT HEAD FINDINGS Brain: No evidence of acute infarction, hemorrhage, hydrocephalus, extra-axial collection,  visible mass lesion or mass effect. Diffuse prominence of the cisterns and sulci compatible with parenchymal volume loss. Ventricular dilatation is sat Lea out of proportion to the degree of sulcal prominence with slight narrowing of the callososeptal angle and upward bowing of the corpus callosum. Patchy areas of white matter hypoattenuation are most compatible with chronic microvascular angiopathy. Midline structures are normal. Cerebellar tonsils are normally position. Vascular: Atherosclerotic calcification of the carotid siphons and intradural vertebral arteries. No hyperdense vessel. Skull: Right frontal/supraorbital swelling and laceration extending to the temporal soft tissues as well. Thin crescentic scalp hematoma measuring up to 6 mm maximal thickness. No subjacent calvarial fracture or other acute osseous injury of the calvaria. Other: None. CT MAXILLOFACIAL FINDINGS Osseous: No fracture of the bony orbits. Chronic appearing deformities of the nasal bones similar to comparison imaging in 2011. No other mid face fractures are seen. The pterygoid plates are intact.  No visible or suspected temporal bone fractures. Temporomandibular joints are normally aligned. The mandible is intact. Numerous dental amalgams and metallic orthotic hardware limit evaluation of the dentition. No clearly fractured or avulsed teeth. Orbits: Some slightly asymmetric right supraorbital swelling and palpebral thickening. No retro septal gas, stranding or hemorrhage. Bilateral lens extractions. Small amount of right sub palpebral gas is a normal finding. Sinuses: Postsurgical changes from prior antrectomies and partial ethmoidectomies. Some pneumatized secretions and thickening are present within the posterior ethmoids on the left with slightly hyperostotic changes likely reflecting an acute on chronic sinusitis. Remaining paranasal sinuses and mastoid air cells are predominantly clear. Middle ear cavities are clear. Ossicular  chains are normally configured. Soft tissues: Right periorbital and supraorbital soft tissue swelling. Mild right malar contusion and laceration as well. Question some mild pre mental thickening. Soft tissues are otherwise unremarkable without further gas or foreign body. CT CERVICAL SPINE FINDINGS Alignment: Stabilization collar is absent at the time of examination slightly exaggerated cervical lordosis. Minimal anterolisthesis C2 on 3 and retrolisthesis C3 on 4 is favored to be on a degenerative basis with spondylitic changes at these levels. No evidence of traumatic listhesis. No abnormally widened, perched or jumped facets. Normal alignment of the craniocervical articulations accounting for mild rightward cranial rotation. Effacement of the atlantodental interval likely related to advanced arthrosis. Skull base and vertebrae: The osseous structures appear diffusely demineralized which may limit detection of small or nondisplaced fractures. No visible skull base fracture is seen. No discernible fracture of the vertebral bodies or posterior elements. No worrisome lytic or blastic lesions. Multilevel cervical spondylitic changes as below. Advanced arthrosis at the atlantodental and basion dens interval including a likely degenerative os odontoideum. Soft tissues and spinal canal: No pre or paravertebral fluid or swelling. No visible canal hematoma. Some minimal enthesopathic mineralization in the nuchal ligament. Disc levels: Multilevel cervical spondylitic changes are present with diffuse intervertebral disc height loss and posterior ridging with disc osteophyte complexes. Larger complexes at C4-5 and C5-6 result in at most mild canal stenosis with only partial effacement of ventral thecal sac at the remaining cervical levels. Diffuse uncinate spurring and facet hypertrophic changes result in mild-to-moderate multilevel neural foraminal narrowing without severe foraminal impingement in the cervical spine. Upper  chest: Some apical emphysematous changes and biapical pleuroparenchymal scarring. Calcification of the proximal great vessels. Cervical carotid atherosclerosis is noted. Other: No concerning thyroid nodules or masses. Diminutive appearance of the thyroid. IMPRESSION: 1. Right frontal/supraorbital swelling and laceration extending to the temporal soft tissues as well. Thin crescentic scalp hematoma measuring up to 6 mm maximal thickness. No subjacent calvarial fracture or other acute osseous injury. 2. No acute intracranial abnormality. 3. Background of chronic microvascular angiopathy and parenchymal volume loss. Ventricular dilatation is slightly out of proportion to the degree of sulcal prominence with narrowing of the callososeptal angle and upward bowing of the corpus callosum. Findings could suggest a normal pressure hydrocephalus though are similar to comparison imaging. Correlate with clinical symptoms. 4. Mild right malar contusion and laceration. 5. No acute facial bone fracture. 6. Chronic appearing deformities of the nasal bones. 7. Postsurgical changes prior sinus surgery. Residual pneumatized secretions and thickening within the posterior ethmoids on the left with slightly hyperostotic changes likely reflecting an acute on chronic sinusitis. 8. No evidence of acute fracture or traumatic listhesis of the cervical spine. 9. Diffuse cervical spondylitic changes with multifactorial mild canal stenosis and mild-to-moderate multilevel foraminal narrowing as described above. 10. Cervical and intracranial atherosclerosis. 11.  Emphysema (ICD10-J43.9). Electronically Signed   By: Lovena Le M.D.   On: 01/15/2020 04:19   Pelvis Portable  Result Date: 01/13/2020 CLINICAL DATA:  Status post right hip fracture repair EXAM: PORTABLE PELVIS 1-2 VIEWS COMPARISON:  None. FINDINGS: The right hip fracture is been repaired with a gamma nail and intramedullary rod with a distal interlocking screw. Postoperative  changes noted. No other acute abnormalities. IMPRESSION: Right hip fracture repair as above. Electronically Signed   By: Dorise Bullion III M.D   On: 01/12/2020 11:22   DG Knee Right Port  Result Date: 12/30/2019 CLINICAL DATA:  Closed comminuted intertrochanteric femur fracture. EXAM: PORTABLE RIGHT KNEE - 1-2 VIEW COMPARISON:  Hip radiography from earlier today FINDINGS: Negative for knee subluxation or fracture. Osteopenia. Extensive arterial calcification. IMPRESSION: No acute finding. Electronically Signed   By: Monte Fantasia M.D.   On: 01/04/2020 09:09   DG C-Arm 1-60 Min-No Report  Result Date: 12/26/2019 Fluoroscopy was utilized by the requesting physician.  No radiographic interpretation.   DG HIP OPERATIVE UNILAT W OR W/O PELVIS RIGHT  Result Date: 12/28/2019 CLINICAL DATA:  Right hip fracture repair EXAM: OPERATIVE RIGHT HIP (WITH PELVIS IF PERFORMED)  VIEWS TECHNIQUE: Fluoroscopic spot image(s) were submitted for interpretation post-operatively. FLUOROSCOPY TIME:  40 seconds. Images: Two COMPARISON:  None. FINDINGS: A gamma nail and intramedullary femoral rod have been placed across the right hip fracture. Alignment is improved in the interval. No dislocation. A distal interlocking screw is noted. IMPRESSION: Right hip fracture repair as above. Electronically Signed   By: Dorise Bullion III M.D   On: 01/05/2020 11:22   DG Hip Unilat W or Wo Pelvis 2-3 Views Right  Result Date: 12/24/2019 CLINICAL DATA:  Initial evaluation for acute trauma, fall. EXAM: DG HIP (WITH OR WITHOUT PELVIS) 2-3V RIGHT COMPARISON:  None. FINDINGS: Acute comminuted intratrochanteric fracture of the right hip with superior subluxation. Bony pelvis grossly intact. Limited views of the left hip demonstrate no acute finding. Underlying osteopenia. Prominent vascular calcifications about the proximal thighs. Degenerative change noted within the lower lumbar spine. IMPRESSION: Acute comminuted intertrochanteric  fracture of the right hip. Electronically Signed   By: Jeannine Boga M.D.   On: 12/29/2019 02:10   CT Maxillofacial WO CM  Result Date: 01/02/2020 CLINICAL DATA:  Fall, right cheek skin tear and right supraorbital hematoma EXAM: CT HEAD WITHOUT CONTRAST CT MAXILLOFACIAL WITHOUT CONTRAST CT CERVICAL SPINE WITHOUT CONTRAST TECHNIQUE: Multidetector CT imaging of the head, cervical spine, and maxillofacial structures were performed using the standard protocol without intravenous contrast. Multiplanar CT image reconstructions of the cervical spine and maxillofacial structures were also generated. COMPARISON:  CT head 12/17/2014, MRI 12/12/2009, paranasal sinus CT 11/04/2009 FINDINGS: CT HEAD FINDINGS Brain: No evidence of acute infarction, hemorrhage, hydrocephalus, extra-axial collection, visible mass lesion or mass effect. Diffuse prominence of the cisterns and sulci compatible with parenchymal volume loss. Ventricular dilatation is sat Lea out of proportion to the degree of sulcal prominence with slight narrowing of the callososeptal angle and upward bowing of the corpus callosum. Patchy areas of white matter hypoattenuation are most compatible with chronic microvascular angiopathy. Midline structures are normal. Cerebellar tonsils are normally position. Vascular: Atherosclerotic calcification of the carotid siphons and intradural vertebral arteries. No hyperdense vessel. Skull: Right frontal/supraorbital swelling and laceration extending to the temporal soft tissues as well. Thin crescentic scalp hematoma measuring up to 6 mm maximal thickness. No subjacent calvarial fracture or other acute osseous injury of the calvaria. Other: None. CT MAXILLOFACIAL  FINDINGS Osseous: No fracture of the bony orbits. Chronic appearing deformities of the nasal bones similar to comparison imaging in 2011. No other mid face fractures are seen. The pterygoid plates are intact. No visible or suspected temporal bone fractures.  Temporomandibular joints are normally aligned. The mandible is intact. Numerous dental amalgams and metallic orthotic hardware limit evaluation of the dentition. No clearly fractured or avulsed teeth. Orbits: Some slightly asymmetric right supraorbital swelling and palpebral thickening. No retro septal gas, stranding or hemorrhage. Bilateral lens extractions. Small amount of right sub palpebral gas is a normal finding. Sinuses: Postsurgical changes from prior antrectomies and partial ethmoidectomies. Some pneumatized secretions and thickening are present within the posterior ethmoids on the left with slightly hyperostotic changes likely reflecting an acute on chronic sinusitis. Remaining paranasal sinuses and mastoid air cells are predominantly clear. Middle ear cavities are clear. Ossicular chains are normally configured. Soft tissues: Right periorbital and supraorbital soft tissue swelling. Mild right malar contusion and laceration as well. Question some mild pre mental thickening. Soft tissues are otherwise unremarkable without further gas or foreign body. CT CERVICAL SPINE FINDINGS Alignment: Stabilization collar is absent at the time of examination slightly exaggerated cervical lordosis. Minimal anterolisthesis C2 on 3 and retrolisthesis C3 on 4 is favored to be on a degenerative basis with spondylitic changes at these levels. No evidence of traumatic listhesis. No abnormally widened, perched or jumped facets. Normal alignment of the craniocervical articulations accounting for mild rightward cranial rotation. Effacement of the atlantodental interval likely related to advanced arthrosis. Skull base and vertebrae: The osseous structures appear diffusely demineralized which may limit detection of small or nondisplaced fractures. No visible skull base fracture is seen. No discernible fracture of the vertebral bodies or posterior elements. No worrisome lytic or blastic lesions. Multilevel cervical spondylitic  changes as below. Advanced arthrosis at the atlantodental and basion dens interval including a likely degenerative os odontoideum. Soft tissues and spinal canal: No pre or paravertebral fluid or swelling. No visible canal hematoma. Some minimal enthesopathic mineralization in the nuchal ligament. Disc levels: Multilevel cervical spondylitic changes are present with diffuse intervertebral disc height loss and posterior ridging with disc osteophyte complexes. Larger complexes at C4-5 and C5-6 result in at most mild canal stenosis with only partial effacement of ventral thecal sac at the remaining cervical levels. Diffuse uncinate spurring and facet hypertrophic changes result in mild-to-moderate multilevel neural foraminal narrowing without severe foraminal impingement in the cervical spine. Upper chest: Some apical emphysematous changes and biapical pleuroparenchymal scarring. Calcification of the proximal great vessels. Cervical carotid atherosclerosis is noted. Other: No concerning thyroid nodules or masses. Diminutive appearance of the thyroid. IMPRESSION: 1. Right frontal/supraorbital swelling and laceration extending to the temporal soft tissues as well. Thin crescentic scalp hematoma measuring up to 6 mm maximal thickness. No subjacent calvarial fracture or other acute osseous injury. 2. No acute intracranial abnormality. 3. Background of chronic microvascular angiopathy and parenchymal volume loss. Ventricular dilatation is slightly out of proportion to the degree of sulcal prominence with narrowing of the callososeptal angle and upward bowing of the corpus callosum. Findings could suggest a normal pressure hydrocephalus though are similar to comparison imaging. Correlate with clinical symptoms. 4. Mild right malar contusion and laceration. 5. No acute facial bone fracture. 6. Chronic appearing deformities of the nasal bones. 7. Postsurgical changes prior sinus surgery. Residual pneumatized secretions and  thickening within the posterior ethmoids on the left with slightly hyperostotic changes likely reflecting an acute on chronic sinusitis. 8. No evidence  of acute fracture or traumatic listhesis of the cervical spine. 9. Diffuse cervical spondylitic changes with multifactorial mild canal stenosis and mild-to-moderate multilevel foraminal narrowing as described above. 10. Cervical and intracranial atherosclerosis. 11.  Emphysema (ICD10-J43.9). Electronically Signed   By: Lovena Le M.D.   On: 01/04/2020 04:19    Lab Data:  CBC: Recent Labs  Lab 01/15/2020 0053 12/31/2019 0519  WBC 16.4* 11.4*  NEUTROABS 13.9*  --   HGB 9.6* 8.8*  HCT 30.5* 27.1*  MCV 95.0 92.5  PLT 194 390   Basic Metabolic Panel: Recent Labs  Lab 12/31/2019 0053 12/24/2019 0519  NA 137 140  K 4.5 4.7  CL 108 110  CO2 21* 19*  GLUCOSE 162* 139*  BUN 20 28*  CREATININE 1.44* 1.59*  CALCIUM 8.9 8.9  MG  --  2.0  PHOS  --  3.6   GFR: Estimated Creatinine Clearance: 30.5 mL/min (A) (by C-G formula based on SCr of 1.59 mg/dL (H)). Liver Function Tests: Recent Labs  Lab 01/16/2020 0053 12/28/2019 0519  AST 20 22  ALT 13 <5  ALKPHOS 82 75  BILITOT 0.6 0.9  PROT 7.3 7.1  ALBUMIN 3.5 3.5   No results for input(s): LIPASE, AMYLASE in the last 168 hours. No results for input(s): AMMONIA in the last 168 hours. Coagulation Profile: Recent Labs  Lab 01/08/2020 0053 01/06/2020 0519  INR 2.7* 1.7*   Cardiac Enzymes: No results for input(s): CKTOTAL, CKMB, CKMBINDEX, TROPONINI in the last 168 hours. BNP (last 3 results) No results for input(s): PROBNP in the last 8760 hours. HbA1C: No results for input(s): HGBA1C in the last 72 hours. CBG: No results for input(s): GLUCAP in the last 168 hours. Lipid Profile: No results for input(s): CHOL, HDL, LDLCALC, TRIG, CHOLHDL, LDLDIRECT in the last 72 hours. Thyroid Function Tests: No results for input(s): TSH, T4TOTAL, FREET4, T3FREE, THYROIDAB in the last 72 hours. Anemia  Panel: No results for input(s): VITAMINB12, FOLATE, FERRITIN, TIBC, IRON, RETICCTPCT in the last 72 hours. Urine analysis:    Component Value Date/Time   COLORURINE YELLOW 12/29/2019 0940   APPEARANCEUR CLEAR 12/30/2019 0940   LABSPEC 1.017 01/18/2020 0940   PHURINE 6.0 12/23/2019 0940   GLUCOSEU NEGATIVE 12/21/2019 0940   GLUCOSEU NEGATIVE 10/16/2017 1613   HGBUR MODERATE (A) 12/28/2019 0940   BILIRUBINUR NEGATIVE 12/27/2019 0940   KETONESUR NEGATIVE 01/08/2020 0940   PROTEINUR NEGATIVE 12/22/2019 0940   UROBILINOGEN 0.2 10/16/2017 1613   NITRITE NEGATIVE 12/31/2019 0940   LEUKOCYTESUR NEGATIVE 01/13/2020 0940     Carys Malina M.D. [facility] 01/10/2020, 11:58 AM   Call night coverage person covering after 7pm

## 2020-01-10 NOTE — Progress Notes (Signed)
PT Cancellation Note  Patient Details Name: Robert Richard MRN: [id number] DOB: 1930/07/17   Cancelled Treatment:     PT order received but eval deferred.  Pt admitted with R femur fx requiring surgical stabilization. Surgery tentatively set for this date.   PT service to sign off at this time and will follow up with new post op orders.   Charlei Ramsaran 12/30/2019, 7:06 AM

## 2020-01-10 NOTE — Progress Notes (Signed)
OT Cancellation Note  Patient Details Name: Robert Richard MRN: [id number] DOB: 1930/05/18   Cancelled Treatment:    Reason Eval/Treat Not Completed: Patient not medically ready. Received OT evaluation order. Will evaluate as able after surgery.  Robert Richard 01/01/2020, 6:32 AM

## 2020-01-10 NOTE — Progress Notes (Signed)
Pt tx in PACU for pre-op. Pain all over, lying on Lt side down. IV infiltrated, removed IV @ Lt arm, restarted Rt posterior low arm with 20G.  Wife @ bedside due to did not talk or see Psychologist, sport and exercise.

## 2020-01-10 NOTE — Anesthesia Procedure Notes (Signed)
Procedure Name: Intubation Date/Time: 01/17/2020 8:41 AM Performed by: Willisha Sligar D, CRNA Pre-anesthesia Checklist: Patient identified, Emergency Drugs available, Suction available and Patient being monitored Patient Re-evaluated:Patient Re-evaluated prior to induction Oxygen Delivery Method: Circle system utilized Preoxygenation: Pre-oxygenation with 100% oxygen Induction Type: IV induction Ventilation: Mask ventilation without difficulty Laryngoscope Size: Mac and 4 Grade View: Grade I Tube type: Oral Tube size: 7.5 mm Number of attempts: 1 Airway Equipment and Method: Stylet Placement Confirmation: ETT inserted through vocal cords under direct vision,  positive ETCO2 and breath sounds checked- equal and bilateral Secured at: 22 cm Tube secured with: Tape Dental Injury: Teeth and Oropharynx as per pre-operative assessment

## 2020-01-10 NOTE — OR Nursing (Signed)
X-ray at bedside

## 2020-01-10 NOTE — Discharge Instructions (Signed)
 Dr. Klynn Linnemann Adult Hip & Knee Specialist [facility] [address] [place], [place] [address] [phone]   POSTOPERATIVE DIRECTIONS    Hip Rehabilitation, Guidelines Following Surgery   WEIGHT BEARING Weight bearing as tolerated with assist device (walker, cane, etc) as directed, use it as long as suggested by your surgeon or therapist, typically at least 4-6 weeks.   HOME CARE INSTRUCTIONS  Remove items at home which could result in a fall. This includes throw rugs or furniture in walking pathways.  Continue medications as instructed at time of discharge.  You may have some home medications which will be placed on hold until you complete the course of blood thinner medication.  4 days after discharge, you may start showering. No tub baths or soaking your incisions. Do not put on socks or shoes without following the instructions of your caregivers.   Sit on chairs with arms. Use the chair arms to help push yourself up when arising.  Arrange for the use of a toilet seat elevator so you are not sitting low.   Walk with walker as instructed.  You may resume a sexual relationship in one month or when given the OK by your caregiver.  Use walker as long as suggested by your caregivers.  Avoid periods of inactivity such as sitting longer than an hour when not asleep. This helps prevent blood clots.  You may return to work once you are cleared by your surgeon.  Do not drive a car for 6 weeks or until released by your surgeon.  Do not drive while taking narcotics.  Wear elastic stockings for two weeks following surgery during the day but you may remove then at night.  Make sure you keep all of your appointments after your operation with all of your doctors and caregivers. You should call the office at the above phone number and make an appointment for approximately two weeks after the date of your surgery. Please pick up a stool softener and laxative  for home use as long as you are requiring pain medications.  ICE to the affected hip every three hours for 30 minutes at a time and then as needed for pain and swelling. Continue to use ice on the hip for pain and swelling from surgery. You may notice swelling that will progress down to the foot and ankle.  This is normal after surgery.  Elevate the leg when you are not up walking on it.   It is important for you to complete the blood thinner medication as prescribed by your doctor.  Continue to use the breathing machine which will help keep your temperature down.  It is common for your temperature to cycle up and down following surgery, especially at night when you are not up moving around and exerting yourself.  The breathing machine keeps your lungs expanded and your temperature down.  RANGE OF MOTION AND STRENGTHENING EXERCISES  These exercises are designed to help you keep full movement of your hip joint. Follow your caregiver's or physical therapist's instructions. Perform all exercises about fifteen times, three times per day or as directed. Exercise both hips, even if you have had only one joint replacement. These exercises can be done on a training (exercise) mat, on the floor, on a table or on a bed. Use whatever works the best and is most comfortable for you. Use music or television while you are exercising so that the exercises are a pleasant break in your day. This   will make your life better with the exercises acting as a break in routine you can look forward to.  Lying on your back, slowly slide your foot toward your buttocks, raising your knee up off the floor. Then slowly slide your foot back down until your leg is straight again.  Lying on your back spread your legs as far apart as you can without causing discomfort.  Lying on your side, raise your upper leg and foot straight up from the floor as far as is comfortable. Slowly lower the leg and repeat.  Lying on your back, tighten up the  muscle in the front of your thigh (quadriceps muscles). You can do this by keeping your leg straight and trying to raise your heel off the floor. This helps strengthen the largest muscle supporting your knee.  Lying on your back, tighten up the muscles of your buttocks both with the legs straight and with the knee bent at a comfortable angle while keeping your heel on the floor.   SKILLED REHAB INSTRUCTIONS: If the patient is transferred to a skilled rehab facility following release from the hospital, a list of the current medications will be sent to the facility for the patient to continue.  When discharged from the skilled rehab facility, please have the facility set up the patient's Home Health Physical Therapy prior to being released. Also, the skilled facility will be responsible for providing the patient with their medications at time of release from the facility to include their pain medication and their blood thinner medication. If the patient is still at the rehab facility at time of the two week follow up appointment, the skilled rehab facility will also need to assist the patient in arranging follow up appointment in our office and any transportation needs.  MAKE SURE YOU:  Understand these instructions.  Will watch your condition.  Will get help right away if you are not doing well or get worse.  Pick up stool softner and laxative for home use following surgery while on pain medications. Daily dry dressing changes as needed. In 4 days, you may remove your dressings and begin taking showers - no tub baths or soaking the incisions. Continue to use ice for pain and swelling after surgery. Do not use any lotions or creams on the incision until instructed by your surgeon.   

## 2020-01-10 NOTE — Anesthesia Postprocedure Evaluation (Signed)
Anesthesia Post Note  Patient: Robert Richard  Procedure(s) Performed: INTRAMEDULLARY (IM) NAIL INTERTROCHANTRIC (Right Hip)     Patient location during evaluation: PACU Anesthesia Type: General Level of consciousness: awake Pain management: pain level controlled Vital Signs Assessment: post-procedure vital signs reviewed and stable Respiratory status: spontaneous breathing, nonlabored ventilation, respiratory function stable and patient connected to nasal cannula oxygen Cardiovascular status: blood pressure returned to baseline and stable Postop Assessment: no apparent nausea or vomiting Anesthetic complications: no   No complications documented.  Last Vitals:  Vitals:   01/02/2020 1045 01/09/2020 1111  BP: (!) 90/58 (!) 105/56  Pulse: (!) 48 63  Resp: (!) 22 20  Temp: 36.6 C 36.9 C  SpO2: 95% 98%    Last Pain:  Vitals:   01/14/2020 1111  TempSrc: Axillary  PainSc: Asleep                 Kylei Purington

## 2020-01-10 NOTE — Progress Notes (Signed)
[place] for warfarin, bridge with Lovenox Indication: Afib  Allergies  Allergen Reactions  . Iodinated Diagnostic Agents Hives    Other reaction(s): RASH Other reaction(s): RASH  . Ioxaglate Hives    Other reaction(s): RASH    Patient Measurements: Height: 6\' 2"  (188 cm) Weight: 65.8 kg (145 lb) IBW/kg (Calculated) : 82.2  Vital Signs: Temp: 98.4 F (36.9 C) (11/21 1111) Temp Source: Axillary (11/21 1111) BP: 105/56 (11/21 1111) Pulse Rate: 63 (11/21 1111)  Labs: Recent Labs    01/09/2020 0053 01/10/20 0519  HGB 9.6* 8.8*  HCT 30.5* 27.1*  PLT 194 184  APTT  --  46*  LABPROT 27.4* 19.2*  INR 2.7* 1.7*  CREATININE 1.44* 1.59*    Estimated Creatinine Clearance: 30.5 mL/min (A) (by C-G formula based on SCr of 1.59 mg/dL (H)).   Medical History: Past Medical History:  Diagnosis Date  . Allergic rhinitis   . Anemia   . Arthritis    SHOULDER  . Asthma with bronchitis   . Colon polyp   . COPD (chronic obstructive pulmonary disease) (HCC)    Astmatic bronchitis  . Dysrhythmia 08/2012   NEW ONSET ATRIAL FIBRILATION  . Gout    PMH  . Headache(784.0)   . HLD (hyperlipidemia)   . Nephrolithiasis   . Osteoporosis   . RLS (restless legs syndrome)   . Skin cancer (melanoma) ([place]) 2012   Right neck  . Skin cancer, basal cell    Dr Nevada Crane, PMH    Medications:  Medications Prior to Admission  Medication Sig Dispense Refill Last Dose  . carbidopa-levodopa (SINEMET IR) 25-100 MG tablet Take 1 tablet at 8am, 1pm, and 6pm (Patient taking differently: Take 1 tablet by mouth 3 (three) times daily. Take 1 tablet at 8am, 1pm, and 6pm) 270 tablet 3 Past Week at Unknown time  . diltiazem (CARDIZEM CD) 240 MG 24 hr capsule TAKE ONE CAPSULE (240MG  TOTAL) BY MOUTH DAILY (Patient taking differently: Take 240 mg by mouth daily. ) 90 capsule 0 01/03/2020 at Unknown time  . gabapentin (NEURONTIN) 100 MG capsule Take 1 capsule (100 mg total) by  mouth at bedtime. 90 capsule 3 Past Week at Unknown time  . ketotifen (ZADITOR) 0.025 % ophthalmic solution Place 1 drop into both eyes daily.    Past Week at Unknown time  . Multiple Vitamins-Minerals (DAILY MULTIVITAMIN) CAPS Take 1 capsule by mouth daily.    12/21/2019 at Unknown time  . rosuvastatin (CRESTOR) 5 MG tablet Take 0.5 tablets (2.5 mg total) by mouth every other day. 45 tablet 3 Past Week at Unknown time  . topiramate (TOPAMAX) 25 MG tablet Take 75 mg by mouth daily.   Past Week at Unknown time  . warfarin (JANTOVEN) 2 MG tablet TAKE ONE (1) TABLET BY MOUTH EVERY DAY OR AS DIRECTED BY COUMADIN CLINIC (Patient taking differently: Take 2 mg by mouth daily. ) 100 tablet 1 01/07/2020  . busPIRone (BUSPAR) 15 MG tablet TAKE ONE TABLET BY MOUTH TWICE A DAY Annual appt is due w/labs must see provider for future refills (Patient not taking: Reported on 12/26/2019) 60 tablet 0 Not Taking at Unknown time  . nitroGLYCERIN (NITROSTAT) 0.4 MG SL tablet Place 0.4 mg under the tongue every 5 (five) minutes as needed for chest pain.     . pantoprazole (PROTONIX) 20 MG tablet Take 1 tablet (20 mg total) by mouth daily. Take 30 minutes prior to a meal.  For heartburn (Patient not taking:  Reported on 12/22/2019) 90 tablet 1 Not Taking at Unknown time   Scheduled:  . carbidopa-levodopa  1 tablet Oral TID  . Chlorhexidine Gluconate Cloth  6 each Topical Daily  . diltiazem  240 mg Oral Daily  . docusate sodium  100 mg Oral BID  . [START ON 01/11/2020] enoxaparin (LOVENOX) injection  30 mg Subcutaneous Q12H  . fentaNYL      . gabapentin  100 mg Oral QHS  . ketotifen  1 drop Both Eyes Daily  . multivitamin with minerals   Oral Daily  . pantoprazole  40 mg Oral Daily  . rosuvastatin  2.5 mg Oral QODAY  . topiramate  75 mg Oral Daily  . warfarin  2 mg Oral ONCE-1600  . Warfarin - Pharmacist Dosing Inpatient   Does not apply q1600    Assessment: 47 yoM with PMH Afib and AVR (bioprosthetic) on  warfarin, admitted for femur fracture, now s/p IM nailing, and Pharmacy to resume warfarin with custom Lovenox bridging per Ortho request.   Baseline INR slightly subtherapeutic after PO Vit K 10 mg  Prior anticoagulation: warfarin 2 mg PO daily  Significant events:  11/20: Vit K 10 mg PO  Today, 01/11/2020:  CBC: Hgb down postop as expected; Plt stable WNL  INR subtherapeutic this AM prior to resuming warfarin  Major drug interactions: none  No bleeding issues per nursing  Diet ordered  CrCl low, borderline for severe impairment  Goal of Therapy: INR 2-3 - per [facility] clinic documentation  Plan:  Warfarin 2 mg PO tonight at 16:00, can increase to 4 mg tomorrow if no issues overnight, as some warfarin resistance to be expected after vit K.   Daily INR  CBC at least q72 hr while on warfarin  Per Ortho request, bridging with Lovenox 0.5 mg/kg SQ bid - will leave as BID as CrCl just over 30 ml/min, but will round dose down to 30 mg bid  Monitor for signs of bleeding or thrombosis   Reuel Boom, PharmD, BCPS [phone] 01/13/2020, 11:34 AM

## 2020-01-11 ENCOUNTER — Encounter (HOSPITAL_COMMUNITY): Payer: Self-pay | Admitting: Orthopedic Surgery

## 2020-01-11 DIAGNOSIS — I2510 Atherosclerotic heart disease of native coronary artery without angina pectoris: Secondary | ICD-10-CM

## 2020-01-11 DIAGNOSIS — N183 Chronic kidney disease, stage 3 unspecified: Secondary | ICD-10-CM | POA: Diagnosis not present

## 2020-01-11 DIAGNOSIS — S72141A Displaced intertrochanteric fracture of right femur, initial encounter for closed fracture: Secondary | ICD-10-CM | POA: Diagnosis not present

## 2020-01-11 DIAGNOSIS — I4891 Unspecified atrial fibrillation: Secondary | ICD-10-CM

## 2020-01-11 DIAGNOSIS — S72001A Fracture of unspecified part of neck of right femur, initial encounter for closed fracture: Secondary | ICD-10-CM | POA: Diagnosis not present

## 2020-01-11 DIAGNOSIS — S0083XA Contusion of other part of head, initial encounter: Secondary | ICD-10-CM

## 2020-01-11 LAB — PREPARE RBC (CROSSMATCH)

## 2020-01-11 LAB — CBC
HCT: 22.5 % — ABNORMAL LOW (ref 39.0–52.0)
Hemoglobin: 7.2 g/dL — ABNORMAL LOW (ref 13.0–17.0)
MCH: 29.9 pg (ref 26.0–34.0)
MCHC: 32 g/dL (ref 30.0–36.0)
MCV: 93.4 fL (ref 80.0–100.0)
Platelets: 146 10*3/uL — ABNORMAL LOW (ref 150–400)
RBC: 2.41 MIL/uL — ABNORMAL LOW (ref 4.22–5.81)
RDW: 13.5 % (ref 11.5–15.5)
WBC: 14.5 10*3/uL — ABNORMAL HIGH (ref 4.0–10.5)
nRBC: 0 % (ref 0.0–0.2)

## 2020-01-11 LAB — TYPE AND SCREEN
ABO/RH(D): O POS
Antibody Screen: NEGATIVE
Unit division: 0

## 2020-01-11 LAB — BASIC METABOLIC PANEL
Anion gap: 10 (ref 5–15)
BUN: 32 mg/dL — ABNORMAL HIGH (ref 8–23)
CO2: 20 mmol/L — ABNORMAL LOW (ref 22–32)
Calcium: 8.3 mg/dL — ABNORMAL LOW (ref 8.9–10.3)
Chloride: 107 mmol/L (ref 98–111)
Creatinine, Ser: 1.62 mg/dL — ABNORMAL HIGH (ref 0.61–1.24)
GFR, Estimated: 41 mL/min — ABNORMAL LOW (ref >60)
Glucose, Bld: 157 mg/dL — ABNORMAL HIGH (ref 70–99)
Potassium: 4.5 mmol/L (ref 3.5–5.1)
Sodium: 137 mmol/L (ref 135–145)

## 2020-01-11 LAB — HEMOGLOBIN AND HEMATOCRIT, BLOOD
HCT: 24.2 % — ABNORMAL LOW (ref 39.0–52.0)
Hemoglobin: 8.1 g/dL — ABNORMAL LOW (ref 13.0–17.0)

## 2020-01-11 LAB — BPAM RBC
Blood Product Expiration Date: 202112222359
Unit Type and Rh: 5100

## 2020-01-11 LAB — PROTIME-INR
INR: 1.5 — ABNORMAL HIGH (ref 0.8–1.2)
Prothrombin Time: 17.8 seconds — ABNORMAL HIGH (ref 11.4–15.2)

## 2020-01-11 LAB — GLUCOSE, CAPILLARY: Glucose-Capillary: 178 mg/dL — ABNORMAL HIGH (ref 70–99)

## 2020-01-11 MED ORDER — DIGOXIN 0.25 MG/ML IJ SOLN
0.2500 mg | Freq: Once | INTRAMUSCULAR | Status: AC
  Administered 2020-01-11: 0.25 mg via INTRAVENOUS
  Filled 2020-01-11: qty 1

## 2020-01-11 MED ORDER — ACETAMINOPHEN 325 MG PO TABS
650.0000 mg | ORAL_TABLET | Freq: Four times a day (QID) | ORAL | Status: DC | PRN
  Administered 2020-01-11 – 2020-01-21 (×5): 650 mg via ORAL
  Filled 2020-01-11 (×5): qty 2

## 2020-01-11 MED ORDER — SODIUM CHLORIDE 0.9% IV SOLUTION: Freq: Once | INTRAVENOUS | Status: DC

## 2020-01-11 MED ORDER — WARFARIN SODIUM 3 MG PO TABS
3.0000 mg | ORAL_TABLET | Freq: Once | ORAL | Status: AC
  Administered 2020-01-11: 3 mg via ORAL
  Filled 2020-01-11: qty 1

## 2020-01-11 MED ORDER — LACTATED RINGERS IV BOLUS
1000.0000 mL | Freq: Once | INTRAVENOUS | Status: AC
  Administered 2020-01-11: 1000 mL via INTRAVENOUS

## 2020-01-11 MED ORDER — CHLORHEXIDINE GLUCONATE CLOTH 2 % EX PADS
6.0000 | MEDICATED_PAD | Freq: Every day | CUTANEOUS | Status: DC
  Administered 2020-01-12: 6 via TOPICAL

## 2020-01-11 MED ORDER — DILTIAZEM HCL 25 MG/5ML IV SOLN
10.0000 mg | Freq: Once | INTRAVENOUS | Status: AC
  Administered 2020-01-11: 10 mg via INTRAVENOUS
  Filled 2020-01-11: qty 5

## 2020-01-11 MED ORDER — DILTIAZEM HCL ER COATED BEADS 240 MG PO CP24
240.0000 mg | ORAL_CAPSULE | Freq: Every day | ORAL | Status: DC
  Administered 2020-01-11 – 2020-01-14 (×4): 240 mg via ORAL
  Filled 2020-01-11 (×4): qty 1

## 2020-01-11 MED ORDER — DILTIAZEM HCL 25 MG/5ML IV SOLN
5.0000 mg | Freq: Once | INTRAVENOUS | Status: AC
  Administered 2020-01-11: 5 mg via INTRAVENOUS
  Filled 2020-01-11: qty 5

## 2020-01-11 MED ORDER — DILTIAZEM HCL 25 MG/5ML IV SOLN
10.0000 mg | Freq: Once | INTRAVENOUS | Status: AC
  Administered 2020-01-11: 08:00:00 10 mg via INTRAVENOUS
  Filled 2020-01-11: qty 5

## 2020-01-11 NOTE — Progress Notes (Signed)
OT Cancellation Note  Patient Details Name: Robert Richard MRN: [id number] DOB: 1930-12-25   Cancelled Treatment:    Reason Eval/Treat Not Completed: Medical issues which prohibited therapy--per RN, pt to transfer to ICU. Will hold OT for now and check back another day. Thanks.  [name], OTR/L Acute Rehab Services Pager [phone] Office [phone]     [name] 01/11/2020, 10:44 AM

## 2020-01-11 NOTE — Progress Notes (Signed)
    Subjective:  Patient reports pain as mild to moderate.  Denies N/V/CP/SOB.   Objective:   VITALS:   Vitals:   01/11/20 0939 01/11/20 1017 01/11/20 1224 01/11/20 1252  BP: 128/71 127/71 (!) 107/47 (!) 120/59  Pulse: (!) 110 (!) 103 (!) 112   Resp: 20 (!) 22 16 19   Temp:  99.4 F (37.4 C) 98.6 F (37 C) 98.7 F (37.1 C)  TempSrc:  Oral Oral Oral  SpO2: 100% 100% 100% 99%  Weight:      Height:        NAD ABD soft Neurovascular intact Sensation intact distally Intact pulses distally Dorsiflexion/Plantar flexion intact Incision: dressing C/D/I   Lab Results  Component Value Date   WBC 14.5 (H) 01/11/2020   HGB 7.2 (L) 01/11/2020   HCT 22.5 (L) 01/11/2020   MCV 93.4 01/11/2020   PLT 146 (L) 01/11/2020   BMET    Component Value Date/Time   NA 137 01/11/2020 0500   NA 140 11/18/2019 1358   K 4.5 01/11/2020 0500   CL 107 01/11/2020 0500   CO2 20 (L) 01/11/2020 0500   GLUCOSE 157 (H) 01/11/2020 0500   BUN 32 (H) 01/11/2020 0500   BUN 17 11/18/2019 1358   CREATININE 1.62 (H) 01/11/2020 0500   CREATININE 1.10 06/15/2013 1136   CALCIUM 8.3 (L) 01/11/2020 0500   GFRNONAA 41 (L) 01/11/2020 0500   GFRNONAA 63 06/15/2013 1136   GFRAA 46 (L) 11/18/2019 1358   GFRAA 72 06/15/2013 1136     Assessment/Plan: 1 Day Post-Op   Principal Problem:   Closed right hip fracture, initial encounter ([place]) Active Problems:   Hyperlipidemia   CKD (chronic kidney disease)   CAD (coronary artery disease)   Diabetes mellitus without complication (HCC)   GERD (gastroesophageal reflux disease)   Parkinson's disease ([place])   Facial laceration   Leukocytosis   Hyperglycemia   Fall at home, initial encounter   WBAT with walker DVT ppx: Coumadin, SCDs, TEDS PO pain control ABLA: Treat per hospitalist recommendations PT/OT Dispo: D/C pending per hospitalist and PT recommendations    Robert Richard 01/11/2020, 2:13 PM  [facility] is now [facility] [address] [place], [place], [place] [address] Phone: [phone] [email or web address] Facebook  [employer]

## 2020-01-11 NOTE — Progress Notes (Signed)
Patients HR elevated to 130's after repositioning, HR remained sustained, cardio and hospitalist made aware - orders given for 10mg  of IV cardizem - med administered, will continue to monitor.

## 2020-01-11 NOTE — Progress Notes (Addendum)
[facility]                                                                              Patient Demographics  Robert Richard, is a 86 y.o. male, DOB - 1930-06-13, DDU:[id number]  Admit date - 01/09/2020   Admitting Physician Bernadette Hoit, DO  Outpatient Primary MD for the patient is [name], MD  Outpatient specialists:   LOS - 2  days   Medical records reviewed and are as summarized below:    Chief Complaint  Patient presents with  . Fall  . Leg Injury       Brief summary   Patient is 86yo patient with CAD, hyperlipidemia, A. fib on warfarin, GERD, CKD stage III, Parkinson's disease presented to ED after sustaining a fall at home.  History per his wife, patient had gotten out of the shower and while he was in the bathroom, he fell and landed on the right side of his face, right hip.  Patient sustained facial laceration with scalp contusion.  He also complained of right hip pain with difficulty in being able to bear weight on the right leg due to pain.  EMS was called. In ED BP was soft-2/52, and leukocytosis with normocytic anemia.  Creatinine 1.4.  CT imaging showed no acute intracranial abnormality but showed right frontal/supraorbital swelling and laceration extending to the temporal soft tissues, mild right liver contusion and laceration.  No facial bone fracture. Hip x-ray showed right hip fracture Patient was transferred to [facility] for further work-up  Assessment & Plan    Principal Problem: Acute comminuted closed right hip fracture, after mechanical fall at home [facility]) -Right hip x-ray showed acute comminuted intertrochanteric fracture of the right hip with superior subluxation -Underwent intramedullary fixation of the right femur by Dr. [name], postop day #1 -Continue pain control, bowel regimen -Complicated by anemia and A. fib with RVR today, will hold off on PT  Active Problems: Atrial fibrillation with RVR -Heart rate in 130s  this morning, no chest pain, mild shortness of breath -Received digoxin 0.25 mg IV x1, Cardizem 5 mg IV x1 this morning At the time of my examination still in rapid A. Fib, gave Cardizem 10 mg IV x1 and resumed oral Cardizem 240 mg daily Heart rate also likely worse due to acute on chronic blood loss anemia, postop -Cardiology consulted, will transfer to progressive telemetry floor.  If remains in A. fib with RVR, may need amiodarone drip  Acute on chronic blood loss anemia, postop -Hemoglobin 7.2, transfuse 2 units packed RBCs, discussed with patient and wife  Right frontal/supraorbital swelling and laceration -Continue wound care, no active bleeding  Leukocytosis possibly reactive WBC 16.4 at the time of admission, no obvious signs of any acute infectious process  -Likely reactive, currently afebrile, no acute signs of infection  Diabetes mellitus type 2, diet controlled -Hemoglobin A1c 6.5 08/09/2019 -Continue carb modified diet  CKD stage IIIa -Baseline creatinine ~1.5 -Avoid nephrotoxic medications, hypotension -Likely worsened today to 1.6 due to anemia, rapid A. fib with RVR, transfusing 2 units   CAD, hyperlipidemia Continue Crestor  GERD -Continue Protonix  Parkinson's disease Continue Sinemet  Underweight, protein calorie malnutrition Estimated body mass index is 18.62 kg/m as calculated from the following:   Height as of this encounter: 6\' 2"  (1.88 m).   Weight as of this encounter: 65.8 kg.  Nutritional supplements  Code Status: Full code DVT Prophylaxis: Coumadin Family Communication: Discussed all imaging results, lab results, explained to the patient's wife at the bedside   Disposition Plan:     Status is: Inpatient  Remains inpatient appropriate because:Inpatient level of care appropriate due to severity of illness   Dispo: The patient is from: Home              Anticipated d/c is to: TBD              Anticipated d/c date is: 3 days               Patient currently is not medically stable to d/c.      Time Spent in minutes   35 minutes  Procedures:  Intramedullary right femur fracture repair on 11/21  Consultants:   Orthopedics Cardiology  Antimicrobials:   Anti-infectives (From admission, onward)   Start     Dose/Rate Route Frequency Ordered Stop   01/02/2020 1500  ceFAZolin (ANCEF) IVPB 2g/100 mL premix        2 g 200 mL/hr over 30 Minutes Intravenous Every 6 hours 01/16/2020 1113 01/07/2020 2141         Medications  Scheduled Meds: . sodium chloride   Intravenous Once  . carbidopa-levodopa  1 tablet Oral TID  . Chlorhexidine Gluconate Cloth  6 each Topical Daily  . diltiazem  240 mg Oral Daily  . docusate sodium  100 mg Oral BID  . enoxaparin (LOVENOX) injection  30 mg Subcutaneous Q12H  . feeding supplement  237 mL Oral BID BM  . gabapentin  100 mg Oral QHS  . ketotifen  1 drop Both Eyes Daily  . multivitamin with minerals   Oral Daily  . pantoprazole  40 mg Oral Daily  . rosuvastatin  2.5 mg Oral QODAY  . topiramate  75 mg Oral Daily  . Warfarin - Pharmacist Dosing Inpatient   Does not apply q1600   Continuous Infusions: . sodium chloride 100 mL/hr at 01/01/2020 1552   PRN Meds:.menthol-cetylpyridinium **OR** phenol, metoCLOPramide **OR** metoCLOPramide (REGLAN) injection, morphine injection, ondansetron **OR** ondansetron (ZOFRAN) IV      Subjective:   Robert Richard was seen and examined today.  Patient seen multiple times today during rounds due to atrial fibrillation with RVR.  No chest pain or dizziness but having mild shortness of breath.  Placed on O2 2 L.  No nausea vomiting, abdominal pain or diarrhea.  Objective:   Vitals:   01/11/20 0726 01/11/20 0839 01/11/20 0905 01/11/20 0939  BP:  133/62 131/72 128/71  Pulse: (!) 136 (!) 109 (!) 103 (!) 110  Resp:  14 16 20   Temp:  99.1 F (37.3 C) 99.1 F (37.3 C)   TempSrc:  Oral Oral   SpO2: 99% 97% 98% 100%  Weight:      Height:         Intake/Output Summary (Last 24 hours) at 01/11/2020 1003 Last data filed at 01/11/2020 0600 Gross per 24 hour  Intake 2058.44 ml  Output 700 ml  Net 1358.44 ml     Wt Readings from Last 3 Encounters:  12/23/2019 65.8 kg  10/23/19 61.2 kg  08/03/19 63.7 kg   Physical Exam  General: Alert  and oriented x 3, NAD  Cardiovascular: Irregularly irregular, tachycardia, A. fib  Respiratory: CTAB, no wheezing, rales or rhonchi  Gastrointestinal: Soft, nontender, nondistended, NBS  Ext: no pedal edema bilaterally  Neuro: no new deficits  Musculoskeletal: No cyanosis, clubbing  Skin: No rashes  Psych: Normal affect and demeanor, alert and oriented x3     Data Reviewed:  I have personally reviewed following labs and imaging studies  Micro Results Recent Results (from the past 240 hour(s))  Resp Panel by RT-PCR (Flu A&B, Covid) Nasopharyngeal Swab     Status: None   Collection Time: 01/13/2020 11:50 PM   Specimen: Nasopharyngeal Swab; Nasopharyngeal(NP) swabs in vial transport medium  Result Value Ref Range Status   SARS Coronavirus 2 by RT PCR NEGATIVE NEGATIVE Final    Comment: (NOTE) SARS-CoV-2 target nucleic acids are NOT DETECTED.  The SARS-CoV-2 RNA is generally detectable in upper respiratory specimens during the acute phase of infection. The lowest concentration of SARS-CoV-2 viral copies this assay can detect is 138 copies/mL. A negative result does not preclude SARS-Cov-2 infection and should not be used as the sole basis for treatment or other patient management decisions. A negative result may occur with  improper specimen collection/handling, submission of specimen other than nasopharyngeal swab, presence of viral mutation(s) within the areas targeted by this assay, and inadequate number of viral copies(<138 copies/mL). A negative result must be combined with clinical observations, patient history, and epidemiological information. The expected result is  Negative.  Fact Sheet for Patients:  EntrepreneurPulse.com.au  Fact Sheet for Healthcare Providers:  IncredibleEmployment.be  This test is no t yet approved or cleared by the [place] FDA and  has been authorized for detection and/or diagnosis of SARS-CoV-2 by FDA under an Emergency Use Authorization (EUA). This EUA will remain  in effect (meaning this test can be used) for the duration of the COVID-19 declaration under Section 564(b)(1) of the Act, 21 U.S.C.section 360bbb-3(b)(1), unless the authorization is terminated  or revoked sooner.       Influenza A by PCR NEGATIVE NEGATIVE Final   Influenza B by PCR NEGATIVE NEGATIVE Final    Comment: (NOTE) The Xpert Xpress SARS-CoV-2/FLU/RSV plus assay is intended as an aid in the diagnosis of influenza from Nasopharyngeal swab specimens and should not be used as a sole basis for treatment. Nasal washings and aspirates are unacceptable for Xpert Xpress SARS-CoV-2/FLU/RSV testing.  Fact Sheet for Patients: EntrepreneurPulse.com.au  Fact Sheet for Healthcare Providers: IncredibleEmployment.be  This test is not yet approved or cleared by the [place] FDA and has been authorized for detection and/or diagnosis of SARS-CoV-2 by FDA under an Emergency Use Authorization (EUA). This EUA will remain in effect (meaning this test can be used) for the duration of the COVID-19 declaration under Section 564(b)(1) of the Act, 21 U.S.C. section 360bbb-3(b)(1), unless the authorization is terminated or revoked.  Performed at [facility], [address], [place], [place] [address]   Surgical pcr screen     Status: None   Collection Time: 01/15/2020  9:02 PM   Specimen: Nasal Mucosa; Nasal Swab  Result Value Ref Range Status   MRSA, PCR NEGATIVE NEGATIVE Final   Staphylococcus aureus NEGATIVE NEGATIVE Final    Comment: (NOTE) The Xpert SA Assay (FDA approved for  NASAL specimens in patients 39 years of age and older), is one component of a comprehensive surveillance program. It is not intended to diagnose infection nor to guide or monitor treatment. Performed at [facility],  [place] [address] [place], [place] [address]     Radiology Reports DG Chest 1 View  Result Date: 12/26/2019 CLINICAL DATA:  Initial evaluation for preoperative evaluation, hip fracture. EXAM: CHEST  1 VIEW COMPARISON:  Prior radiograph from 10/15/2017 FINDINGS: Median sternotomy wires underlying CABG markers and valvular prosthesis noted. Mild exaggeration of the cardiac silhouette related AP technique. Mediastinal silhouette normal. Aortic atherosclerosis. Lungs mildly hypoinflated. Associated mild patchy left basilar opacity, likely atelectasis. No focal infiltrates. Mild perihilar vascular congestion without overt pulmonary edema. No pleural effusion. No pneumothorax. No acute osseous finding.  Osteopenia. IMPRESSION: 1. Hypoinflation with mild patchy left basilar opacity, likely atelectasis. 2. 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 3.  Aortic Atherosclerosis (ICD10-I70.0). Electronically Signed   By: Jeannine Boga M.D.   On: 01/17/2020 02:12   CT Head Wo Contrast  Result Date: 12/31/2019 CLINICAL DATA:  Fall, right cheek skin tear and right supraorbital hematoma EXAM: CT HEAD WITHOUT CONTRAST CT MAXILLOFACIAL WITHOUT CONTRAST CT CERVICAL SPINE WITHOUT CONTRAST TECHNIQUE: Multidetector CT imaging of the head, cervical spine, and maxillofacial structures were performed using the standard protocol without intravenous contrast. Multiplanar CT image reconstructions of the cervical spine and maxillofacial structures were also generated. COMPARISON:  CT head 12/17/2014, MRI 12/12/2009, paranasal sinus CT 11/04/2009 FINDINGS: CT HEAD FINDINGS Brain: No evidence of acute infarction, hemorrhage, hydrocephalus, extra-axial collection, visible mass  lesion or mass effect. Diffuse prominence of the cisterns and sulci compatible with parenchymal volume loss. Ventricular dilatation is sat Lea out of proportion to the degree of sulcal prominence with slight narrowing of the callososeptal angle and upward bowing of the corpus callosum. Patchy areas of white matter hypoattenuation are most compatible with chronic microvascular angiopathy. Midline structures are normal. Cerebellar tonsils are normally position. Vascular: Atherosclerotic calcification of the carotid siphons and intradural vertebral arteries. No hyperdense vessel. Skull: Right frontal/supraorbital swelling and laceration extending to the temporal soft tissues as well. Thin crescentic scalp hematoma measuring up to 6 mm maximal thickness. No subjacent calvarial fracture or other acute osseous injury of the calvaria. Other: None. CT MAXILLOFACIAL FINDINGS Osseous: No fracture of the bony orbits. Chronic appearing deformities of the nasal bones similar to comparison imaging in 2011. No other mid face fractures are seen. The pterygoid plates are intact. No visible or suspected temporal bone fractures. Temporomandibular joints are normally aligned. The mandible is intact. Numerous dental amalgams and metallic orthotic hardware limit evaluation of the dentition. No clearly fractured or avulsed teeth. Orbits: Some slightly asymmetric right supraorbital swelling and palpebral thickening. No retro septal gas, stranding or hemorrhage. Bilateral lens extractions. Small amount of right sub palpebral gas is a normal finding. Sinuses: Postsurgical changes from prior antrectomies and partial ethmoidectomies. Some pneumatized secretions and thickening are present within the posterior ethmoids on the left with slightly hyperostotic changes likely reflecting an acute on chronic sinusitis. Remaining paranasal sinuses and mastoid air cells are predominantly clear. Middle ear cavities are clear. Ossicular chains are  normally configured. Soft tissues: Right periorbital and supraorbital soft tissue swelling. Mild right malar contusion and laceration as well. Question some mild pre mental thickening. Soft tissues are otherwise unremarkable without further gas or foreign body. CT CERVICAL SPINE FINDINGS Alignment: Stabilization collar is absent at the time of examination slightly exaggerated cervical lordosis. Minimal anterolisthesis C2 on 3 and retrolisthesis C3 on 4 is favored to be on a degenerative basis with spondylitic changes at these levels. No evidence of traumatic listhesis. No abnormally widened, perched or jumped  facets. Normal alignment of the craniocervical articulations accounting for mild rightward cranial rotation. Effacement of the atlantodental interval likely related to advanced arthrosis. Skull base and vertebrae: The osseous structures appear diffusely demineralized which may limit detection of small or nondisplaced fractures. No visible skull base fracture is seen. No discernible fracture of the vertebral bodies or posterior elements. No worrisome lytic or blastic lesions. Multilevel cervical spondylitic changes as below. Advanced arthrosis at the atlantodental and basion dens interval including a likely degenerative os odontoideum. Soft tissues and spinal canal: No pre or paravertebral fluid or swelling. No visible canal hematoma. Some minimal enthesopathic mineralization in the nuchal ligament. Disc levels: Multilevel cervical spondylitic changes are present with diffuse intervertebral disc height loss and posterior ridging with disc osteophyte complexes. Larger complexes at C4-5 and C5-6 result in at most mild canal stenosis with only partial effacement of ventral thecal sac at the remaining cervical levels. Diffuse uncinate spurring and facet hypertrophic changes result in mild-to-moderate multilevel neural foraminal narrowing without severe foraminal impingement in the cervical spine. Upper chest: Some  apical emphysematous changes and biapical pleuroparenchymal scarring. Calcification of the proximal great vessels. Cervical carotid atherosclerosis is noted. Other: No concerning thyroid nodules or masses. Diminutive appearance of the thyroid. IMPRESSION: 1. Right frontal/supraorbital swelling and laceration extending to the temporal soft tissues as well. Thin crescentic scalp hematoma measuring up to 6 mm maximal thickness. No subjacent calvarial fracture or other acute osseous injury. 2. No acute intracranial abnormality. 3. Background of chronic microvascular angiopathy and parenchymal volume loss. Ventricular dilatation is slightly out of proportion to the degree of sulcal prominence with narrowing of the callososeptal angle and upward bowing of the corpus callosum. Findings could suggest a normal pressure hydrocephalus though are similar to comparison imaging. Correlate with clinical symptoms. 4. Mild right malar contusion and laceration. 5. No acute facial bone fracture. 6. Chronic appearing deformities of the nasal bones. 7. Postsurgical changes prior sinus surgery. Residual pneumatized secretions and thickening within the posterior ethmoids on the left with slightly hyperostotic changes likely reflecting an acute on chronic sinusitis. 8. No evidence of acute fracture or traumatic listhesis of the cervical spine. 9. Diffuse cervical spondylitic changes with multifactorial mild canal stenosis and mild-to-moderate multilevel foraminal narrowing as described above. 10. Cervical and intracranial atherosclerosis. 11.  Emphysema (ICD10-J43.9). Electronically Signed   By: Lovena Le M.D.   On: 01/05/2020 04:19   CT Cervical Spine Wo Contrast  Result Date: 01/18/2020 CLINICAL DATA:  Fall, right cheek skin tear and right supraorbital hematoma EXAM: CT HEAD WITHOUT CONTRAST CT MAXILLOFACIAL WITHOUT CONTRAST CT CERVICAL SPINE WITHOUT CONTRAST TECHNIQUE: Multidetector CT imaging of the head, cervical spine, and  maxillofacial structures were performed using the standard protocol without intravenous contrast. Multiplanar CT image reconstructions of the cervical spine and maxillofacial structures were also generated. COMPARISON:  CT head 12/17/2014, MRI 12/12/2009, paranasal sinus CT 11/04/2009 FINDINGS: CT HEAD FINDINGS Brain: No evidence of acute infarction, hemorrhage, hydrocephalus, extra-axial collection, visible mass lesion or mass effect. Diffuse prominence of the cisterns and sulci compatible with parenchymal volume loss. Ventricular dilatation is sat Lea out of proportion to the degree of sulcal prominence with slight narrowing of the callososeptal angle and upward bowing of the corpus callosum. Patchy areas of white matter hypoattenuation are most compatible with chronic microvascular angiopathy. Midline structures are normal. Cerebellar tonsils are normally position. Vascular: Atherosclerotic calcification of the carotid siphons and intradural vertebral arteries. No hyperdense vessel. Skull: Right frontal/supraorbital swelling and laceration extending to the temporal  soft tissues as well. Thin crescentic scalp hematoma measuring up to 6 mm maximal thickness. No subjacent calvarial fracture or other acute osseous injury of the calvaria. Other: None. CT MAXILLOFACIAL FINDINGS Osseous: No fracture of the bony orbits. Chronic appearing deformities of the nasal bones similar to comparison imaging in 2011. No other mid face fractures are seen. The pterygoid plates are intact. No visible or suspected temporal bone fractures. Temporomandibular joints are normally aligned. The mandible is intact. Numerous dental amalgams and metallic orthotic hardware limit evaluation of the dentition. No clearly fractured or avulsed teeth. Orbits: Some slightly asymmetric right supraorbital swelling and palpebral thickening. No retro septal gas, stranding or hemorrhage. Bilateral lens extractions. Small amount of right sub palpebral gas is  a normal finding. Sinuses: Postsurgical changes from prior antrectomies and partial ethmoidectomies. Some pneumatized secretions and thickening are present within the posterior ethmoids on the left with slightly hyperostotic changes likely reflecting an acute on chronic sinusitis. Remaining paranasal sinuses and mastoid air cells are predominantly clear. Middle ear cavities are clear. Ossicular chains are normally configured. Soft tissues: Right periorbital and supraorbital soft tissue swelling. Mild right malar contusion and laceration as well. Question some mild pre mental thickening. Soft tissues are otherwise unremarkable without further gas or foreign body. CT CERVICAL SPINE FINDINGS Alignment: Stabilization collar is absent at the time of examination slightly exaggerated cervical lordosis. Minimal anterolisthesis C2 on 3 and retrolisthesis C3 on 4 is favored to be on a degenerative basis with spondylitic changes at these levels. No evidence of traumatic listhesis. No abnormally widened, perched or jumped facets. Normal alignment of the craniocervical articulations accounting for mild rightward cranial rotation. Effacement of the atlantodental interval likely related to advanced arthrosis. Skull base and vertebrae: The osseous structures appear diffusely demineralized which may limit detection of small or nondisplaced fractures. No visible skull base fracture is seen. No discernible fracture of the vertebral bodies or posterior elements. No worrisome lytic or blastic lesions. Multilevel cervical spondylitic changes as below. Advanced arthrosis at the atlantodental and basion dens interval including a likely degenerative os odontoideum. Soft tissues and spinal canal: No pre or paravertebral fluid or swelling. No visible canal hematoma. Some minimal enthesopathic mineralization in the nuchal ligament. Disc levels: Multilevel cervical spondylitic changes are present with diffuse intervertebral disc height loss and  posterior ridging with disc osteophyte complexes. Larger complexes at C4-5 and C5-6 result in at most mild canal stenosis with only partial effacement of ventral thecal sac at the remaining cervical levels. Diffuse uncinate spurring and facet hypertrophic changes result in mild-to-moderate multilevel neural foraminal narrowing without severe foraminal impingement in the cervical spine. Upper chest: Some apical emphysematous changes and biapical pleuroparenchymal scarring. Calcification of the proximal great vessels. Cervical carotid atherosclerosis is noted. Other: No concerning thyroid nodules or masses. Diminutive appearance of the thyroid. IMPRESSION: 1. Right frontal/supraorbital swelling and laceration extending to the temporal soft tissues as well. Thin crescentic scalp hematoma measuring up to 6 mm maximal thickness. No subjacent calvarial fracture or other acute osseous injury. 2. No acute intracranial abnormality. 3. Background of chronic microvascular angiopathy and parenchymal volume loss. Ventricular dilatation is slightly out of proportion to the degree of sulcal prominence with narrowing of the callososeptal angle and upward bowing of the corpus callosum. Findings could suggest a normal pressure hydrocephalus though are similar to comparison imaging. Correlate with clinical symptoms. 4. Mild right malar contusion and laceration. 5. No acute facial bone fracture. 6. Chronic appearing deformities of the nasal bones. 7. Postsurgical  changes prior sinus surgery. Residual pneumatized secretions and thickening within the posterior ethmoids on the left with slightly hyperostotic changes likely reflecting an acute on chronic sinusitis. 8. No evidence of acute fracture or traumatic listhesis of the cervical spine. 9. Diffuse cervical spondylitic changes with multifactorial mild canal stenosis and mild-to-moderate multilevel foraminal narrowing as described above. 10. Cervical and intracranial atherosclerosis.  11.  Emphysema (ICD10-J43.9). Electronically Signed   By: Lovena Le M.D.   On: 12/29/2019 04:19   Pelvis Portable  Result Date: 12/26/2019 CLINICAL DATA:  Status post right hip fracture repair EXAM: PORTABLE PELVIS 1-2 VIEWS COMPARISON:  None. FINDINGS: The right hip fracture is been repaired with a gamma nail and intramedullary rod with a distal interlocking screw. Postoperative changes noted. No other acute abnormalities. IMPRESSION: Right hip fracture repair as above. Electronically Signed   By: Dorise Bullion III M.D   On: 12/30/2019 11:22   DG Knee Right Port  Result Date: 01/17/2020 CLINICAL DATA:  Closed comminuted intertrochanteric femur fracture. EXAM: PORTABLE RIGHT KNEE - 1-2 VIEW COMPARISON:  Hip radiography from earlier today FINDINGS: Negative for knee subluxation or fracture. Osteopenia. Extensive arterial calcification. IMPRESSION: No acute finding. Electronically Signed   By: Monte Fantasia M.D.   On: 01/06/2020 09:09   DG C-Arm 1-60 Min-No Report  Result Date: 12/30/2019 Fluoroscopy was utilized by the requesting physician.  No radiographic interpretation.   DG HIP OPERATIVE UNILAT W OR W/O PELVIS RIGHT  Result Date: 12/31/2019 CLINICAL DATA:  Right hip fracture repair EXAM: OPERATIVE RIGHT HIP (WITH PELVIS IF PERFORMED)  VIEWS TECHNIQUE: Fluoroscopic spot image(s) were submitted for interpretation post-operatively. FLUOROSCOPY TIME:  40 seconds. Images: Two COMPARISON:  None. FINDINGS: A gamma nail and intramedullary femoral rod have been placed across the right hip fracture. Alignment is improved in the interval. No dislocation. A distal interlocking screw is noted. IMPRESSION: Right hip fracture repair as above. Electronically Signed   By: Dorise Bullion III M.D   On: 01/09/2020 11:22   DG Hip Unilat W or Wo Pelvis 2-3 Views Right  Result Date: 01/15/2020 CLINICAL DATA:  Initial evaluation for acute trauma, fall. EXAM: DG HIP (WITH OR WITHOUT PELVIS) 2-3V RIGHT  COMPARISON:  None. FINDINGS: Acute comminuted intratrochanteric fracture of the right hip with superior subluxation. Bony pelvis grossly intact. Limited views of the left hip demonstrate no acute finding. Underlying osteopenia. Prominent vascular calcifications about the proximal thighs. Degenerative change noted within the lower lumbar spine. IMPRESSION: Acute comminuted intertrochanteric fracture of the right hip. Electronically Signed   By: Jeannine Boga M.D.   On: 01/17/2020 02:10   CT Maxillofacial WO CM  Result Date: 01/07/2020 CLINICAL DATA:  Fall, right cheek skin tear and right supraorbital hematoma EXAM: CT HEAD WITHOUT CONTRAST CT MAXILLOFACIAL WITHOUT CONTRAST CT CERVICAL SPINE WITHOUT CONTRAST TECHNIQUE: Multidetector CT imaging of the head, cervical spine, and maxillofacial structures were performed using the standard protocol without intravenous contrast. Multiplanar CT image reconstructions of the cervical spine and maxillofacial structures were also generated. COMPARISON:  CT head 12/17/2014, MRI 12/12/2009, paranasal sinus CT 11/04/2009 FINDINGS: CT HEAD FINDINGS Brain: No evidence of acute infarction, hemorrhage, hydrocephalus, extra-axial collection, visible mass lesion or mass effect. Diffuse prominence of the cisterns and sulci compatible with parenchymal volume loss. Ventricular dilatation is sat [place] out of proportion to the degree of sulcal prominence with slight narrowing of the callososeptal angle and upward bowing of the corpus callosum. Patchy areas of white matter hypoattenuation are most compatible with chronic microvascular  angiopathy. Midline structures are normal. Cerebellar tonsils are normally position. Vascular: Atherosclerotic calcification of the carotid siphons and intradural vertebral arteries. No hyperdense vessel. Skull: Right frontal/supraorbital swelling and laceration extending to the temporal soft tissues as well. Thin crescentic scalp hematoma measuring up  to 6 mm maximal thickness. No subjacent calvarial fracture or other acute osseous injury of the calvaria. Other: None. CT MAXILLOFACIAL FINDINGS Osseous: No fracture of the bony orbits. Chronic appearing deformities of the nasal bones similar to comparison imaging in 2011. No other mid face fractures are seen. The pterygoid plates are intact. No visible or suspected temporal bone fractures. Temporomandibular joints are normally aligned. The mandible is intact. Numerous dental amalgams and metallic orthotic hardware limit evaluation of the dentition. No clearly fractured or avulsed teeth. Orbits: Some slightly asymmetric right supraorbital swelling and palpebral thickening. No retro septal gas, stranding or hemorrhage. Bilateral lens extractions. Small amount of right sub palpebral gas is a normal finding. Sinuses: Postsurgical changes from prior antrectomies and partial ethmoidectomies. Some pneumatized secretions and thickening are present within the posterior ethmoids on the left with slightly hyperostotic changes likely reflecting an acute on chronic sinusitis. Remaining paranasal sinuses and mastoid air cells are predominantly clear. Middle ear cavities are clear. Ossicular chains are normally configured. Soft tissues: Right periorbital and supraorbital soft tissue swelling. Mild right malar contusion and laceration as well. Question some mild pre mental thickening. Soft tissues are otherwise unremarkable without further gas or foreign body. CT CERVICAL SPINE FINDINGS Alignment: Stabilization collar is absent at the time of examination slightly exaggerated cervical lordosis. Minimal anterolisthesis C2 on 3 and retrolisthesis C3 on 4 is favored to be on a degenerative basis with spondylitic changes at these levels. No evidence of traumatic listhesis. No abnormally widened, perched or jumped facets. Normal alignment of the craniocervical articulations accounting for mild rightward cranial rotation. Effacement of  the atlantodental interval likely related to advanced arthrosis. Skull base and vertebrae: The osseous structures appear diffusely demineralized which may limit detection of small or nondisplaced fractures. No visible skull base fracture is seen. No discernible fracture of the vertebral bodies or posterior elements. No worrisome lytic or blastic lesions. Multilevel cervical spondylitic changes as below. Advanced arthrosis at the atlantodental and basion dens interval including a likely degenerative os odontoideum. Soft tissues and spinal canal: No pre or paravertebral fluid or swelling. No visible canal hematoma. Some minimal enthesopathic mineralization in the nuchal ligament. Disc levels: Multilevel cervical spondylitic changes are present with diffuse intervertebral disc height loss and posterior ridging with disc osteophyte complexes. Larger complexes at C4-5 and C5-6 result in at most mild canal stenosis with only partial effacement of ventral thecal sac at the remaining cervical levels. Diffuse uncinate spurring and facet hypertrophic changes result in mild-to-moderate multilevel neural foraminal narrowing without severe foraminal impingement in the cervical spine. Upper chest: Some apical emphysematous changes and biapical pleuroparenchymal scarring. Calcification of the proximal great vessels. Cervical carotid atherosclerosis is noted. Other: No concerning thyroid nodules or masses. Diminutive appearance of the thyroid. IMPRESSION: 1. Right frontal/supraorbital swelling and laceration extending to the temporal soft tissues as well. Thin crescentic scalp hematoma measuring up to 6 mm maximal thickness. No subjacent calvarial fracture or other acute osseous injury. 2. No acute intracranial abnormality. 3. Background of chronic microvascular angiopathy and parenchymal volume loss. Ventricular dilatation is slightly out of proportion to the degree of sulcal prominence with narrowing of the callososeptal angle  and upward bowing of the corpus callosum. Findings could suggest a normal  pressure hydrocephalus though are similar to comparison imaging. Correlate with clinical symptoms. 4. Mild right malar contusion and laceration. 5. No acute facial bone fracture. 6. Chronic appearing deformities of the nasal bones. 7. Postsurgical changes prior sinus surgery. Residual pneumatized secretions and thickening within the posterior ethmoids on the left with slightly hyperostotic changes likely reflecting an acute on chronic sinusitis. 8. No evidence of acute fracture or traumatic listhesis of the cervical spine. 9. Diffuse cervical spondylitic changes with multifactorial mild canal stenosis and mild-to-moderate multilevel foraminal narrowing as described above. 10. Cervical and intracranial atherosclerosis. 11.  Emphysema (ICD10-J43.9). Electronically Signed   By: Lovena Le M.D.   On: 01/05/2020 04:19    Lab Data:  CBC: Recent Labs  Lab 12/30/2019 0053 12/29/2019 0519 01/11/20 0500  WBC 16.4* 11.4* 14.5*  NEUTROABS 13.9*  --   --   HGB 9.6* 8.8* 7.2*  HCT 30.5* 27.1* 22.5*  MCV 95.0 92.5 93.4  PLT 194 184 202*   Basic Metabolic Panel: Recent Labs  Lab 01/13/2020 0053 12/29/2019 0519 01/11/20 0500  NA 137 140 137  K 4.5 4.7 4.5  CL 108 110 107  CO2 21* 19* 20*  GLUCOSE 162* 139* 157*  BUN 20 28* 32*  CREATININE 1.44* 1.59* 1.62*  CALCIUM 8.9 8.9 8.3*  MG  --  2.0  --   PHOS  --  3.6  --    GFR: Estimated Creatinine Clearance: 29.9 mL/min (A) (by C-G formula based on SCr of 1.62 mg/dL (H)). Liver Function Tests: Recent Labs  Lab 01/05/2020 0053 01/12/2020 0519  AST 20 22  ALT 13 <5  ALKPHOS 82 75  BILITOT 0.6 0.9  PROT 7.3 7.1  ALBUMIN 3.5 3.5   No results for input(s): LIPASE, AMYLASE in the last 168 hours. No results for input(s): AMMONIA in the last 168 hours. Coagulation Profile: Recent Labs  Lab 01/02/2020 0053 01/13/2020 0519 01/11/20 0500  INR 2.7* 1.7* 1.5*   Cardiac Enzymes: No  results for input(s): CKTOTAL, CKMB, CKMBINDEX, TROPONINI in the last 168 hours. BNP (last 3 results) No results for input(s): PROBNP in the last 8760 hours. HbA1C: No results for input(s): HGBA1C in the last 72 hours. CBG: No results for input(s): GLUCAP in the last 168 hours. Lipid Profile: No results for input(s): CHOL, HDL, LDLCALC, TRIG, CHOLHDL, LDLDIRECT in the last 72 hours. Thyroid Function Tests: No results for input(s): TSH, T4TOTAL, FREET4, T3FREE, THYROIDAB in the last 72 hours. Anemia Panel: No results for input(s): VITAMINB12, FOLATE, FERRITIN, TIBC, IRON, RETICCTPCT in the last 72 hours. Urine analysis:    Component Value Date/Time   COLORURINE YELLOW 01/08/2020 0940   APPEARANCEUR CLEAR 01/16/2020 0940   LABSPEC 1.017 01/01/2020 0940   PHURINE 6.0 01/13/2020 0940   GLUCOSEU NEGATIVE 01/10/2020 0940   GLUCOSEU NEGATIVE 10/16/2017 1613   HGBUR MODERATE (A) 01/03/2020 0940   BILIRUBINUR NEGATIVE 01/10/2020 0940   KETONESUR NEGATIVE 12/26/2019 0940   PROTEINUR NEGATIVE 12/30/2019 0940   UROBILINOGEN 0.2 10/16/2017 1613   NITRITE NEGATIVE 12/28/2019 0940   LEUKOCYTESUR NEGATIVE 12/27/2019 0940     Chanay Nugent M.D. [facility] 01/11/2020, 10:03 AM   Call night coverage person covering after 7pm

## 2020-01-11 NOTE — Progress Notes (Signed)
Called to bedside for Pt HR in the 130s. This Rapid Nurse present while IV Cardizem push 10mg  given. HR decreased to 106/min after IV push given. BP 110/74. MD at bedside. Wanting to continue to monitor on current floor for now

## 2020-01-11 NOTE — Progress Notes (Addendum)
PT Cancellation Note  Patient Details Name: Robert Richard MRN: [id number] DOB: 27-Aug-1930   Cancelled Treatment:    Reason Eval/Treat Not Completed: Medical issues which prohibited therapy--per RN, pt to xfer to ICU. Will hold PT for now and check back another day. Thanks.  Addendum: Rapid response called. Pt transferred to Progressive Care Unit. Will still hold PT for now.     [facility]  Office: [phone] Pager: [phone]

## 2020-01-11 NOTE — Progress Notes (Signed)
Spo2 dropped to mid 80's, HR increased to 160, placed on 2L O2, sats up to mid 90s, HR decreased to 120s.  Rapid notified, MD made aware with orders to transfer to telemetry

## 2020-01-11 NOTE — Progress Notes (Addendum)
Patient currently has yellow MEWS due to pulse. Patient has been in yellow MEWs multiple times since admission and protocol enforced. Patient has previous diagnosis of Afib, pulse is 135. MD on call notified and new order placed and PRN medication given. Will continue to monitor patient. Dawson Bills, RN

## 2020-01-11 NOTE — Progress Notes (Signed)
[place] for warfarin and lovenox Indication: atrial fibrillation and AVR  Allergies  Allergen Reactions  . Iodinated Diagnostic Agents Hives    Other reaction(s): RASH Other reaction(s): RASH  . Ioxaglate Hives    Other reaction(s): RASH    Patient Measurements: Height: 6\' 2"  (188 cm) Weight: 65.8 kg (145 lb) IBW/kg (Calculated) : 82.2 Heparin Dosing Weight:   Vital Signs: Temp: 98.7 F (37.1 C) (11/22 1252) Temp Source: Oral (11/22 1252) BP: 120/59 (11/22 1252) Pulse Rate: 112 (11/22 1224)  Labs: Recent Labs    12/31/2019 0053 01/11/2020 0053 01/04/2020 0519 01/11/20 0500  HGB 9.6*   < > 8.8* 7.2*  HCT 30.5*  --  27.1* 22.5*  PLT 194  --  184 146*  APTT  --   --  46*  --   LABPROT 27.4*  --  19.2* 17.8*  INR 2.7*  --  1.7* 1.5*  CREATININE 1.44*  --  1.59* 1.62*   < > = values in this interval not displayed.    Estimated Creatinine Clearance: 29.9 mL/min (A) (by C-G formula based on SCr of 1.62 mg/dL (H)).   Medications:  - PTA warfarin regimen: 2mg  daily  Assessment: Patient is an 86 y.o M with hx afib and AVR on warfarin PTA, presented to the ED on 11/19 s/p fall with right hip fracture.  He underwent right femur intramedullary fixation on 11/21. Warfarin and lovenox 0.5mg /kg q12h started post-op on 11/21.  - 11/20: vit K 10mg  PO x1  Today, 01/11/2020: - INR is sub-therapeutic at 1.5 - hgb and plts trending down (likely secondary to ABLA). Two units PRBC ordered for today - scr trending up with 1.62 today (crcl~30) - no signif. drug-drug intxns - in afib   Goal of Therapy:  INR 2-3 Monitor platelets by anticoagulation protocol: Yes   Plan:  - warfarin 3mg  PO x1 today - continue lovenox 0.5mg /kg q12h. Monitor renal function closely - monitor for s/sx bleeding  Dia Sitter P 01/11/2020,1:01 PM

## 2020-01-11 NOTE — Plan of Care (Signed)

## 2020-01-11 NOTE — Progress Notes (Signed)
Orthopedic Tech Progress Note Patient Details:  Robert Richard 05-24-1930 [id number]  Ortho Devices Ortho Device/Splint Location: Trapeze bar Ortho Device/Splint Interventions: Application   Post Interventions Patient Tolerated: Well Instructions Provided: Care of device, Adjustment of device   [name] 01/11/2020, 10:34 AM

## 2020-01-11 NOTE — Significant Event (Signed)
Rapid Response Event Note   Reason for Call :  Called to bedside for HR in the 130s, saturations dropped into 80s.  Initial Focused Assessment:  Neuro: A&O x2 (confusion with time and situation. Wife reports he can have intermittent confusion at home) Resp: Clear/dimminished. RR 21/min Cardiac: Afib RVR initially 130s, 1 push cardizem given per order. HR down to 110s. BP 128/71  Interventions:  MD at bedside. Plan to transfer to tele for continuous heart monitoring  Plan of Care:  Transfer to progressive. This Rapid Nurse present until transfer completed to 4th floor    Event Summary:   MD Notified: Dr. Tana Coast  Call Time: 0919 Arrival Time: 0921 End Time: [place], RN BSN

## 2020-01-11 NOTE — Consult Note (Addendum)
Cardiology Consultation:   Patient ID: JUANMIGUEL DEFELICE MRN: [id number]; DOB: [birth year]  Admit date: 12/28/2019 Date of Consult: 01/11/2020  Primary Care Provider: [name], MD [facility] Cardiologist: Dorris Carnes, MD  [facility] Electrophysiologist:  None    Patient Profile:   ZYLAN ALMQUIST is a 86 y.o. 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 Tana Coast.  History of Present Illness:   Mr. Culpepper is followed by Dr. Harrington Challenger for the above cardiac issues. Most recent echo in  10/2018 showed LVEF 60-65%, severely dilated LA and moderately dilated RA, mod MR with mild stenosis, mild aortic dilation and stable AVR with mean gradient 54mmHg. He has a history of permanent Afib rate controleld on diltiazem. Heart monitor in 2017 showed afib with average rates in the 80s. Takes warfarin for anticoagulation. The patient was last seen 10/23/2019 and was stable from a cardiac perspective.   The patient was admitted 12/23/2019 for right hip fracture secondary to mechanical fall. He got out of the shower and turned around when he fell on the right side. Also had scalp contusion and facial laceration. EMS was called who brought him to the ER at [facility]. In the ED BP was soft. Labs showed mild leukocytosis and  Normocytic anemia. Creatinine 1.44. CT face/c-spine showed swelling and laceration but no acute fracture. X-ray showed right hip fracture and the patient was transferred to [facility] for admission. He underwent right hip surgery 01/13/2020.This morning patient was noted to go into afib RVR with rates in the 130s given IV cardizem 10mg  with improvement. BP stable. He was transferred to tele floor and  Cardiology was consulted.   Hgb this morning 7.2 and is receiving blood during interview. He denies chest pain, palpitations, or sob. His wife is a bedside. Says patient has chronic fatigue since CABG in 2015. He lives with his wife and uses a cane for ambulation.    Past Medical History:  Diagnosis Date  . Allergic rhinitis   . Anemia   . Arthritis    SHOULDER  . Asthma with bronchitis   . Colon polyp   . COPD (chronic obstructive pulmonary disease) (HCC)    Astmatic bronchitis  . Dysrhythmia 08/2012   NEW ONSET ATRIAL FIBRILATION  . Gout    PMH  . Headache(784.0)   . HLD (hyperlipidemia)   . Nephrolithiasis   . Osteoporosis   . RLS (restless legs syndrome)   . Skin cancer (melanoma) ([place]) 2012   Right neck  . Skin cancer, basal cell    Dr Nevada Crane, [facility]    Past Surgical History:  Procedure Laterality Date  . AORTIC VALVE REPLACEMENT N/A 02/20/2013   Procedure: AORTIC VALVE REPLACEMENT (AVR);  Surgeon: Ivin Poot, MD;  Location: [place];  Service: Open Heart Surgery;  Laterality: N/A;  . COLONOSCOPY W/ POLYPECTOMY     Dr Deatra Ina  . CORONARY ARTERY BYPASS GRAFT N/A 02/20/2013   Procedure: CORONARY ARTERY BYPASS GRAFTING (CABG);  Surgeon: Ivin Poot, MD;  Location: [place];  Service: Open Heart Surgery;  Laterality: N/A;  . FINGER SURGERY     for foreign body  .  INGUINAL HERNIA REPAIR    . INTRAOPERATIVE TRANSESOPHAGEAL ECHOCARDIOGRAM N/A 02/20/2013   Procedure: INTRAOPERATIVE TRANSESOPHAGEAL ECHOCARDIOGRAM;  Surgeon: Ivin Poot, MD;  Location: [place];  Service: Open Heart Surgery;  Laterality: N/A;  . LEFT AND RIGHT HEART CATHETERIZATION WITH CORONARY ANGIOGRAM N/A 01/05/2013   Procedure: LEFT AND RIGHT HEART CATHETERIZATION WITH CORONARY ANGIOGRAM;  Surgeon: Blane Ohara, MD;  Location: [facility] CATH LAB;  Service: Cardiovascular;  Laterality: N/A;  . LEG SURGERY  06/2010    Dr.Lawson, for blood clott. Left leg   . NECK SURGERY  12/2010   Melanoma ; [facility]   .  NOSE SURGERY     Septal Deviation  . RIGHT HEART CATHETERIZATION N/A 01/23/2013   Procedure: RIGHT HEART CATH;  Surgeon: Jolaine Artist, MD;  Location: [facility] CATH LAB;  Service: Cardiovascular;  Laterality: N/A;  . TEE WITHOUT CARDIOVERSION N/A 01/23/2013   Procedure: TRANSESOPHAGEAL ECHOCARDIOGRAM (TEE);  Surgeon: Jolaine Artist, MD;  Location: [facility] ENDOSCOPY;  Service: Cardiovascular;  Laterality: N/A;  sign heart cath consent w/tee consent    Home Medications:  Prior to Admission medications   Medication Sig Start Date End Date Taking? Authorizing Provider  carbidopa-levodopa (SINEMET IR) 25-100 MG tablet Take 1 tablet at 8am, 1pm, and 6pm Patient taking differently: Take 1 tablet by mouth 3 (three) times daily. Take 1 tablet at 8am, 1pm, and 6pm 03/30/19  Yes Patel, Donika K, DO  diltiazem (CARDIZEM CD) 240 MG 24 hr capsule TAKE ONE CAPSULE (240MG  TOTAL) BY MOUTH DAILY Patient taking differently: Take 240 mg by mouth daily.  11/10/19  Yes Fay Records, MD  gabapentin (NEURONTIN) 100 MG capsule Take 1 capsule (100 mg total) by mouth at bedtime. 03/30/19  Yes Patel, Donika K, DO  ketotifen (ZADITOR) 0.025 % ophthalmic solution Place 1 drop into both eyes daily.    Yes [provider]  Multiple Vitamins-Minerals (DAILY MULTIVITAMIN) CAPS Take 1 capsule by mouth daily.  12/21/10  Yes [provider]  rosuvastatin (CRESTOR) 5 MG tablet Take 0.5 tablets (2.5 mg total) by mouth every other day. 11/19/19  Yes Fay Records, MD  topiramate (TOPAMAX) 25 MG tablet Take 75 mg by mouth daily.   Yes [provider]  warfarin (JANTOVEN) 2 MG tablet TAKE ONE (1) TABLET BY MOUTH EVERY DAY OR AS DIRECTED BY COUMADIN CLINIC Patient taking differently: Take 2 mg by mouth daily.  12/08/19  Yes Fay Records, MD  busPIRone (BUSPAR) 15 MG tablet TAKE ONE TABLET BY MOUTH TWICE A DAY Annual appt is due w/labs must see provider for future refills Patient not taking: Reported on 01/10/2020  06/16/19   [name], MD  nitroGLYCERIN (NITROSTAT) 0.4 MG SL tablet Place 0.4 mg under the tongue every 5 (five) minutes as needed for chest pain.    [provider]  pantoprazole (PROTONIX) 20 MG tablet Take 1 tablet (20 mg total) by mouth daily. Take 30 minutes prior to a meal.  For heartburn Patient not taking: Reported on 01/12/2020 08/03/19   [name], MD    Inpatient Medications: Scheduled Meds: . sodium chloride   Intravenous Once  . carbidopa-levodopa  1 tablet Oral TID  . Chlorhexidine Gluconate Cloth  6 each Topical Daily  . diltiazem  240 mg Oral Daily  . docusate sodium  100 mg Oral BID  . enoxaparin (LOVENOX) injection  30 mg Subcutaneous Q12H  . feeding supplement  237 mL Oral BID BM  . gabapentin  100  mg Oral QHS  . ketotifen  1 drop Both Eyes Daily  . multivitamin with minerals   Oral Daily  . pantoprazole  40 mg Oral Daily  . rosuvastatin  2.5 mg Oral QODAY  . topiramate  75 mg Oral Daily  . Warfarin - Pharmacist Dosing Inpatient   Does not apply q1600   Continuous Infusions: . sodium chloride 100 mL/hr at 01/04/2020 1552   PRN Meds: menthol-cetylpyridinium **OR** phenol, metoCLOPramide **OR** metoCLOPramide (REGLAN) injection, morphine injection, ondansetron **OR** ondansetron (ZOFRAN) IV  Allergies:    Allergies  Allergen Reactions  . Iodinated Diagnostic Agents Hives    Other reaction(s): RASH Other reaction(s): RASH  . Ioxaglate Hives    Other reaction(s): RASH    Social History:   Social History   Socioeconomic History  . Marital status: Married    Spouse name: Not on file  . Number of children: Not on file  . Years of education: Not on file  . Highest education level: Not on file  Occupational History  . Occupation: retired    Fish farm manager: [employer]  Tobacco Use  . Smoking status: Former Smoker    Packs/day: 3.00    Years: 15.00    Pack years: 45.00    Types: Cigarettes    Quit date: 02/19/1961    Years since  quitting: 58.9  . Smokeless tobacco: Never Used  Vaping Use  . Vaping Use: Never used  Substance and Sexual Activity  . Alcohol use: No  . Drug use: No  . Sexual activity: Not on file  Other Topics Concern  . Not on file  Social History Narrative   Right handed   One story home   No caffeine   Social Determinants of Health   Financial Resource Strain:   . Difficulty of Paying Living Expenses: Not on file  Food Insecurity:   . Worried About Charity fundraiser in the Last Year: Not on file  . Ran Out of Food in the Last Year: Not on file  Transportation Needs:   . Lack of Transportation (Medical): Not on file  . Lack of Transportation (Non-Medical): Not on file  Physical Activity:   . Days of Exercise per Week: Not on file  . Minutes of Exercise per Session: Not on file  Stress:   . Feeling of Stress : Not on file  Social Connections:   . Frequency of Communication with Friends and Family: Not on file  . Frequency of Social Gatherings with Friends and Family: Not on file  . Attends Religious Services: Not on file  . Active Member of Clubs or Organizations: Not on file  . Attends Archivist Meetings: Not on file  . Marital Status: Not on file  Intimate Partner Violence:   . Fear of Current or Ex-Partner: Not on file  . Emotionally Abused: Not on file  . Physically Abused: Not on file  . Sexually Abused: Not on file    Family History:   Family History  Problem Relation Age of Onset  . Allergies Mother   . Coronary artery disease Mother   . Allergy (severe) Mother   . Allergies Father   . Stroke Father   . Heart attack Father 33  . Asthma Father   . Allergy (severe) Father   . Allergies Sister   . Allergy (severe) Sister   . Allergies Brother        x2  . Allergy (severe) Brother   . Diabetes Brother   .  Allergy (severe) Brother   . Emphysema Brother        smoked  . Cancer Son      ROS:  Please see the history of present illness.  All other  ROS reviewed and negative.     Physical Exam/Data:   Vitals:   01/11/20 0726 01/11/20 0839 01/11/20 0905 01/11/20 0939  BP:  133/62 131/72 128/71  Pulse: (!) 136 (!) 109 (!) 103 (!) 110  Resp:  14 16 20   Temp:  99.1 F (37.3 C) 99.1 F (37.3 C)   TempSrc:  Oral Oral   SpO2: 99% 97% 98% 100%  Weight:      Height:        Intake/Output Summary (Last 24 hours) at 01/11/2020 0951 Last data filed at 01/11/2020 0600 Gross per 24 hour  Intake 3158.44 ml  Output 700 ml  Net 2458.44 ml   Last 3 Weights 12/30/2019 12/21/2019 10/23/2019  Weight (lbs) 145 lb 134 lb 14.7 oz 135 lb  Weight (kg) 65.772 kg 61.2 kg 61.236 kg     Body mass index is 18.62 kg/m.  General:  Well nourished, well developed, in no acute distress HEENT: normal Lymph: no adenopathy Neck: no JVD Endocrine:  No thryomegaly Vascular: No carotid bruits; FA pulses 2+ bilaterally without bruits  Cardiac:  normal S1, S2; Irreg IRreg, mildly tachycardic; no murmur  Lungs:  clear to auscultation bilaterally, no wheezing, rhonchi or rales  Abd: soft, nontender, no hepatomegaly  Ext: no edema Musculoskeletal:  No deformities, BUE and BLE strength normal and equal Skin: warm and dry  Neuro:  CNs 2-12 intact, no focal abnormalities noted Psych:  Normal affect   EKG:  The EKG was personally reviewed and demonstrates:  pending Telemetry:  Telemetry was personally reviewed and demonstrates:  Afib, HR 100-120  Relevant CV Studies:  Echo 10/2018 1. The left ventricle has normal systolic function with an ejection  fraction of 60-65%. The cavity size was normal. There is mildly increased  left ventricular wall thickness. Left ventricular diastolic function could  not be evaluated.  2. Left atrial size was severely dilated.  3. Right atrial size was moderately dilated.  4. The mitral valve is abnormal. There is severe mitral annular  calcification present. Mitral valve regurgitation is moderate by color  flow Doppler.  Mild mitral valve stenosis.  5. The pulmonic valve was grossly normal. Pulmonic valve regurgitation is  not visualized by color flow Doppler.  6. The aorta is abnormal unless otherwise noted.  7. There is mild dilatation of the ascending aorta measuring 39 mm.   Holter monitor 05/2015 Atrial fibrillation, 66 to 143 bpm   Average HR 86 bpm   Rare PVC   Laboratory Data:  High Sensitivity Troponin:  No results for input(s): TROPONINIHS in the last 720 hours.   Chemistry Recent Labs  Lab 01/16/2020 0053 01/09/2020 0519 01/11/20 0500  NA 137 140 137  K 4.5 4.7 4.5  CL 108 110 107  CO2 21* 19* 20*  GLUCOSE 162* 139* 157*  BUN 20 28* 32*  CREATININE 1.44* 1.59* 1.62*  CALCIUM 8.9 8.9 8.3*  GFRNONAA 47* 42* 41*  ANIONGAP 8 11 10     Recent Labs  Lab 12/31/2019 0053 01/12/2020 0519  PROT 7.3 7.1  ALBUMIN 3.5 3.5  AST 20 22  ALT 13 <5  ALKPHOS 82 75  BILITOT 0.6 0.9   Hematology Recent Labs  Lab 01/13/2020 0053 01/14/2020 0519 01/11/20 0500  WBC 16.4* 11.4* 14.5*  RBC 3.21* 2.93* 2.41*  HGB 9.6* 8.8* 7.2*  HCT 30.5* 27.1* 22.5*  MCV 95.0 92.5 93.4  MCH 29.9 30.0 29.9  MCHC 31.5 32.5 32.0  RDW 13.2 13.3 13.5  PLT 194 184 146*   BNPNo results for input(s): BNP, PROBNP in the last 168 hours.  DDimer No results for input(s): DDIMER in the last 168 hours.   Radiology/Studies:  DG Chest 1 View  Result Date: 12/23/2019 CLINICAL DATA:  Initial evaluation for preoperative evaluation, hip fracture. EXAM: CHEST  1 VIEW COMPARISON:  Prior radiograph from 10/15/2017 FINDINGS: Median sternotomy wires underlying CABG markers and valvular prosthesis noted. Mild exaggeration of the cardiac silhouette related AP technique. Mediastinal silhouette normal. Aortic atherosclerosis. Lungs mildly hypoinflated. Associated mild patchy left basilar opacity, likely atelectasis. No focal infiltrates. Mild perihilar vascular congestion without overt pulmonary edema. No pleural effusion. No  pneumothorax. No acute osseous finding.  Osteopenia. IMPRESSION: 1. Hypoinflation with mild patchy left basilar opacity, likely atelectasis. 2. 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 3.  Aortic Atherosclerosis (ICD10-I70.0). Electronically Signed   By: Jeannine Boga M.D.   On: 12/29/2019 02:12   CT Head Wo Contrast  Result Date: 01/19/2020 CLINICAL DATA:  Fall, right cheek skin tear and right supraorbital hematoma EXAM: CT HEAD WITHOUT CONTRAST CT MAXILLOFACIAL WITHOUT CONTRAST CT CERVICAL SPINE WITHOUT CONTRAST TECHNIQUE: Multidetector CT imaging of the head, cervical spine, and maxillofacial structures were performed using the standard protocol without intravenous contrast. Multiplanar CT image reconstructions of the cervical spine and maxillofacial structures were also generated. COMPARISON:  CT head 12/17/2014, MRI 12/12/2009, paranasal sinus CT 11/04/2009 FINDINGS: CT HEAD FINDINGS Brain: No evidence of acute infarction, hemorrhage, hydrocephalus, extra-axial collection, visible mass lesion or mass effect. Diffuse prominence of the cisterns and sulci compatible with parenchymal volume loss. Ventricular dilatation is sat Lea out of proportion to the degree of sulcal prominence with slight narrowing of the callososeptal angle and upward bowing of the corpus callosum. Patchy areas of white matter hypoattenuation are most compatible with chronic microvascular angiopathy. Midline structures are normal. Cerebellar tonsils are normally position. Vascular: Atherosclerotic calcification of the carotid siphons and intradural vertebral arteries. No hyperdense vessel. Skull: Right frontal/supraorbital swelling and laceration extending to the temporal soft tissues as well. Thin crescentic scalp hematoma measuring up to 6 mm maximal thickness. No subjacent calvarial fracture or other acute osseous injury of the calvaria. Other: None. CT MAXILLOFACIAL FINDINGS Osseous: No fracture of the bony  orbits. Chronic appearing deformities of the nasal bones similar to comparison imaging in 2011. No other mid face fractures are seen. The pterygoid plates are intact. No visible or suspected temporal bone fractures. Temporomandibular joints are normally aligned. The mandible is intact. Numerous dental amalgams and metallic orthotic hardware limit evaluation of the dentition. No clearly fractured or avulsed teeth. Orbits: Some slightly asymmetric right supraorbital swelling and palpebral thickening. No retro septal gas, stranding or hemorrhage. Bilateral lens extractions. Small amount of right sub palpebral gas is a normal finding. Sinuses: Postsurgical changes from prior antrectomies and partial ethmoidectomies. Some pneumatized secretions and thickening are present within the posterior ethmoids on the left with slightly hyperostotic changes likely reflecting an acute on chronic sinusitis. Remaining paranasal sinuses and mastoid air cells are predominantly clear. Middle ear cavities are clear. Ossicular chains are normally configured. Soft tissues: Right periorbital and supraorbital soft tissue swelling. Mild right malar contusion and laceration as well. Question some mild pre mental thickening. Soft tissues are otherwise unremarkable without further gas or  foreign body. CT CERVICAL SPINE FINDINGS Alignment: Stabilization collar is absent at the time of examination slightly exaggerated cervical lordosis. Minimal anterolisthesis C2 on 3 and retrolisthesis C3 on 4 is favored to be on a degenerative basis with spondylitic changes at these levels. No evidence of traumatic listhesis. No abnormally widened, perched or jumped facets. Normal alignment of the craniocervical articulations accounting for mild rightward cranial rotation. Effacement of the atlantodental interval likely related to advanced arthrosis. Skull base and vertebrae: The osseous structures appear diffusely demineralized which may limit detection of small  or nondisplaced fractures. No visible skull base fracture is seen. No discernible fracture of the vertebral bodies or posterior elements. No worrisome lytic or blastic lesions. Multilevel cervical spondylitic changes as below. Advanced arthrosis at the atlantodental and basion dens interval including a likely degenerative os odontoideum. Soft tissues and spinal canal: No pre or paravertebral fluid or swelling. No visible canal hematoma. Some minimal enthesopathic mineralization in the nuchal ligament. Disc levels: Multilevel cervical spondylitic changes are present with diffuse intervertebral disc height loss and posterior ridging with disc osteophyte complexes. Larger complexes at C4-5 and C5-6 result in at most mild canal stenosis with only partial effacement of ventral thecal sac at the remaining cervical levels. Diffuse uncinate spurring and facet hypertrophic changes result in mild-to-moderate multilevel neural foraminal narrowing without severe foraminal impingement in the cervical spine. Upper chest: Some apical emphysematous changes and biapical pleuroparenchymal scarring. Calcification of the proximal great vessels. Cervical carotid atherosclerosis is noted. Other: No concerning thyroid nodules or masses. Diminutive appearance of the thyroid. IMPRESSION: 1. Right frontal/supraorbital swelling and laceration extending to the temporal soft tissues as well. Thin crescentic scalp hematoma measuring up to 6 mm maximal thickness. No subjacent calvarial fracture or other acute osseous injury. 2. No acute intracranial abnormality. 3. Background of chronic microvascular angiopathy and parenchymal volume loss. Ventricular dilatation is slightly out of proportion to the degree of sulcal prominence with narrowing of the callososeptal angle and upward bowing of the corpus callosum. Findings could suggest a normal pressure hydrocephalus though are similar to comparison imaging. Correlate with clinical symptoms. 4. Mild  right malar contusion and laceration. 5. No acute facial bone fracture. 6. Chronic appearing deformities of the nasal bones. 7. Postsurgical changes prior sinus surgery. Residual pneumatized secretions and thickening within the posterior ethmoids on the left with slightly hyperostotic changes likely reflecting an acute on chronic sinusitis. 8. No evidence of acute fracture or traumatic listhesis of the cervical spine. 9. Diffuse cervical spondylitic changes with multifactorial mild canal stenosis and mild-to-moderate multilevel foraminal narrowing as described above. 10. Cervical and intracranial atherosclerosis. 11.  Emphysema (ICD10-J43.9). Electronically Signed   By: Lovena Le M.D.   On: 01/04/2020 04:19   CT Cervical Spine Wo Contrast  Result Date: 01/07/2020 CLINICAL DATA:  Fall, right cheek skin tear and right supraorbital hematoma EXAM: CT HEAD WITHOUT CONTRAST CT MAXILLOFACIAL WITHOUT CONTRAST CT CERVICAL SPINE WITHOUT CONTRAST TECHNIQUE: Multidetector CT imaging of the head, cervical spine, and maxillofacial structures were performed using the standard protocol without intravenous contrast. Multiplanar CT image reconstructions of the cervical spine and maxillofacial structures were also generated. COMPARISON:  CT head 12/17/2014, MRI 12/12/2009, paranasal sinus CT 11/04/2009 FINDINGS: CT HEAD FINDINGS Brain: No evidence of acute infarction, hemorrhage, hydrocephalus, extra-axial collection, visible mass lesion or mass effect. Diffuse prominence of the cisterns and sulci compatible with parenchymal volume loss. Ventricular dilatation is sat Lea out of proportion to the degree of sulcal prominence with slight narrowing of the  callososeptal angle and upward bowing of the corpus callosum. Patchy areas of white matter hypoattenuation are most compatible with chronic microvascular angiopathy. Midline structures are normal. Cerebellar tonsils are normally position. Vascular: Atherosclerotic calcification  of the carotid siphons and intradural vertebral arteries. No hyperdense vessel. Skull: Right frontal/supraorbital swelling and laceration extending to the temporal soft tissues as well. Thin crescentic scalp hematoma measuring up to 6 mm maximal thickness. No subjacent calvarial fracture or other acute osseous injury of the calvaria. Other: None. CT MAXILLOFACIAL FINDINGS Osseous: No fracture of the bony orbits. Chronic appearing deformities of the nasal bones similar to comparison imaging in 2011. No other mid face fractures are seen. The pterygoid plates are intact. No visible or suspected temporal bone fractures. Temporomandibular joints are normally aligned. The mandible is intact. Numerous dental amalgams and metallic orthotic hardware limit evaluation of the dentition. No clearly fractured or avulsed teeth. Orbits: Some slightly asymmetric right supraorbital swelling and palpebral thickening. No retro septal gas, stranding or hemorrhage. Bilateral lens extractions. Small amount of right sub palpebral gas is a normal finding. Sinuses: Postsurgical changes from prior antrectomies and partial ethmoidectomies. Some pneumatized secretions and thickening are present within the posterior ethmoids on the left with slightly hyperostotic changes likely reflecting an acute on chronic sinusitis. Remaining paranasal sinuses and mastoid air cells are predominantly clear. Middle ear cavities are clear. Ossicular chains are normally configured. Soft tissues: Right periorbital and supraorbital soft tissue swelling. Mild right malar contusion and laceration as well. Question some mild pre mental thickening. Soft tissues are otherwise unremarkable without further gas or foreign body. CT CERVICAL SPINE FINDINGS Alignment: Stabilization collar is absent at the time of examination slightly exaggerated cervical lordosis. Minimal anterolisthesis C2 on 3 and retrolisthesis C3 on 4 is favored to be on a degenerative basis with  spondylitic changes at these levels. No evidence of traumatic listhesis. No abnormally widened, perched or jumped facets. Normal alignment of the craniocervical articulations accounting for mild rightward cranial rotation. Effacement of the atlantodental interval likely related to advanced arthrosis. Skull base and vertebrae: The osseous structures appear diffusely demineralized which may limit detection of small or nondisplaced fractures. No visible skull base fracture is seen. No discernible fracture of the vertebral bodies or posterior elements. No worrisome lytic or blastic lesions. Multilevel cervical spondylitic changes as below. Advanced arthrosis at the atlantodental and basion dens interval including a likely degenerative os odontoideum. Soft tissues and spinal canal: No pre or paravertebral fluid or swelling. No visible canal hematoma. Some minimal enthesopathic mineralization in the nuchal ligament. Disc levels: Multilevel cervical spondylitic changes are present with diffuse intervertebral disc height loss and posterior ridging with disc osteophyte complexes. Larger complexes at C4-5 and C5-6 result in at most mild canal stenosis with only partial effacement of ventral thecal sac at the remaining cervical levels. Diffuse uncinate spurring and facet hypertrophic changes result in mild-to-moderate multilevel neural foraminal narrowing without severe foraminal impingement in the cervical spine. Upper chest: Some apical emphysematous changes and biapical pleuroparenchymal scarring. Calcification of the proximal great vessels. Cervical carotid atherosclerosis is noted. Other: No concerning thyroid nodules or masses. Diminutive appearance of the thyroid. IMPRESSION: 1. Right frontal/supraorbital swelling and laceration extending to the temporal soft tissues as well. Thin crescentic scalp hematoma measuring up to 6 mm maximal thickness. No subjacent calvarial fracture or other acute osseous injury. 2. No acute  intracranial abnormality. 3. Background of chronic microvascular angiopathy and parenchymal volume loss. Ventricular dilatation is slightly out of proportion to the degree  of sulcal prominence with narrowing of the callososeptal angle and upward bowing of the corpus callosum. Findings could suggest a normal pressure hydrocephalus though are similar to comparison imaging. Correlate with clinical symptoms. 4. Mild right malar contusion and laceration. 5. No acute facial bone fracture. 6. Chronic appearing deformities of the nasal bones. 7. Postsurgical changes prior sinus surgery. Residual pneumatized secretions and thickening within the posterior ethmoids on the left with slightly hyperostotic changes likely reflecting an acute on chronic sinusitis. 8. No evidence of acute fracture or traumatic listhesis of the cervical spine. 9. Diffuse cervical spondylitic changes with multifactorial mild canal stenosis and mild-to-moderate multilevel foraminal narrowing as described above. 10. Cervical and intracranial atherosclerosis. 11.  Emphysema (ICD10-J43.9). Electronically Signed   By: Lovena Le M.D.   On: 12/23/2019 04:19   Pelvis Portable  Result Date: 01/01/2020 CLINICAL DATA:  Status post right hip fracture repair EXAM: PORTABLE PELVIS 1-2 VIEWS COMPARISON:  None. FINDINGS: The right hip fracture is been repaired with a gamma nail and intramedullary rod with a distal interlocking screw. Postoperative changes noted. No other acute abnormalities. IMPRESSION: Right hip fracture repair as above. Electronically Signed   By: Dorise Bullion III M.D   On: 01/09/2020 11:22   DG Knee Right Port  Result Date: 01/08/2020 CLINICAL DATA:  Closed comminuted intertrochanteric femur fracture. EXAM: PORTABLE RIGHT KNEE - 1-2 VIEW COMPARISON:  Hip radiography from earlier today FINDINGS: Negative for knee subluxation or fracture. Osteopenia. Extensive arterial calcification. IMPRESSION: No acute finding. Electronically  Signed   By: Monte Fantasia M.D.   On: 01/15/2020 09:09   DG C-Arm 1-60 Min-No Report  Result Date: 01/08/2020 Fluoroscopy was utilized by the requesting physician.  No radiographic interpretation.   DG HIP OPERATIVE UNILAT W OR W/O PELVIS RIGHT  Result Date: 01/11/2020 CLINICAL DATA:  Right hip fracture repair EXAM: OPERATIVE RIGHT HIP (WITH PELVIS IF PERFORMED)  VIEWS TECHNIQUE: Fluoroscopic spot image(s) were submitted for interpretation post-operatively. FLUOROSCOPY TIME:  40 seconds. Images: Two COMPARISON:  None. FINDINGS: A gamma nail and intramedullary femoral rod have been placed across the right hip fracture. Alignment is improved in the interval. No dislocation. A distal interlocking screw is noted. IMPRESSION: Right hip fracture repair as above. Electronically Signed   By: Dorise Bullion III M.D   On: 01/04/2020 11:22   DG Hip Unilat W or Wo Pelvis 2-3 Views Right  Result Date: 01/18/2020 CLINICAL DATA:  Initial evaluation for acute trauma, fall. EXAM: DG HIP (WITH OR WITHOUT PELVIS) 2-3V RIGHT COMPARISON:  None. FINDINGS: Acute comminuted intratrochanteric fracture of the right hip with superior subluxation. Bony pelvis grossly intact. Limited views of the left hip demonstrate no acute finding. Underlying osteopenia. Prominent vascular calcifications about the proximal thighs. Degenerative change noted within the lower lumbar spine. IMPRESSION: Acute comminuted intertrochanteric fracture of the right hip. Electronically Signed   By: Jeannine Boga M.D.   On: 01/16/2020 02:10   CT Maxillofacial WO CM  Result Date: 12/22/2019 CLINICAL DATA:  Fall, right cheek skin tear and right supraorbital hematoma EXAM: CT HEAD WITHOUT CONTRAST CT MAXILLOFACIAL WITHOUT CONTRAST CT CERVICAL SPINE WITHOUT CONTRAST TECHNIQUE: Multidetector CT imaging of the head, cervical spine, and maxillofacial structures were performed using the standard protocol without intravenous contrast. Multiplanar CT  image reconstructions of the cervical spine and maxillofacial structures were also generated. COMPARISON:  CT head 12/17/2014, MRI 12/12/2009, paranasal sinus CT 11/04/2009 FINDINGS: CT HEAD FINDINGS Brain: No evidence of acute infarction, hemorrhage, hydrocephalus, extra-axial collection, visible mass  lesion or mass effect. Diffuse prominence of the cisterns and sulci compatible with parenchymal volume loss. Ventricular dilatation is sat Lea out of proportion to the degree of sulcal prominence with slight narrowing of the callososeptal angle and upward bowing of the corpus callosum. Patchy areas of white matter hypoattenuation are most compatible with chronic microvascular angiopathy. Midline structures are normal. Cerebellar tonsils are normally position. Vascular: Atherosclerotic calcification of the carotid siphons and intradural vertebral arteries. No hyperdense vessel. Skull: Right frontal/supraorbital swelling and laceration extending to the temporal soft tissues as well. Thin crescentic scalp hematoma measuring up to 6 mm maximal thickness. No subjacent calvarial fracture or other acute osseous injury of the calvaria. Other: None. CT MAXILLOFACIAL FINDINGS Osseous: No fracture of the bony orbits. Chronic appearing deformities of the nasal bones similar to comparison imaging in 2011. No other mid face fractures are seen. The pterygoid plates are intact. No visible or suspected temporal bone fractures. Temporomandibular joints are normally aligned. The mandible is intact. Numerous dental amalgams and metallic orthotic hardware limit evaluation of the dentition. No clearly fractured or avulsed teeth. Orbits: Some slightly asymmetric right supraorbital swelling and palpebral thickening. No retro septal gas, stranding or hemorrhage. Bilateral lens extractions. Small amount of right sub palpebral gas is a normal finding. Sinuses: Postsurgical changes from prior antrectomies and partial ethmoidectomies. Some  pneumatized secretions and thickening are present within the posterior ethmoids on the left with slightly hyperostotic changes likely reflecting an acute on chronic sinusitis. Remaining paranasal sinuses and mastoid air cells are predominantly clear. Middle ear cavities are clear. Ossicular chains are normally configured. Soft tissues: Right periorbital and supraorbital soft tissue swelling. Mild right malar contusion and laceration as well. Question some mild pre mental thickening. Soft tissues are otherwise unremarkable without further gas or foreign body. CT CERVICAL SPINE FINDINGS Alignment: Stabilization collar is absent at the time of examination slightly exaggerated cervical lordosis. Minimal anterolisthesis C2 on 3 and retrolisthesis C3 on 4 is favored to be on a degenerative basis with spondylitic changes at these levels. No evidence of traumatic listhesis. No abnormally widened, perched or jumped facets. Normal alignment of the craniocervical articulations accounting for mild rightward cranial rotation. Effacement of the atlantodental interval likely related to advanced arthrosis. Skull base and vertebrae: The osseous structures appear diffusely demineralized which may limit detection of small or nondisplaced fractures. No visible skull base fracture is seen. No discernible fracture of the vertebral bodies or posterior elements. No worrisome lytic or blastic lesions. Multilevel cervical spondylitic changes as below. Advanced arthrosis at the atlantodental and basion dens interval including a likely degenerative os odontoideum. Soft tissues and spinal canal: No pre or paravertebral fluid or swelling. No visible canal hematoma. Some minimal enthesopathic mineralization in the nuchal ligament. Disc levels: Multilevel cervical spondylitic changes are present with diffuse intervertebral disc height loss and posterior ridging with disc osteophyte complexes. Larger complexes at C4-5 and C5-6 result in at most  mild canal stenosis with only partial effacement of ventral thecal sac at the remaining cervical levels. Diffuse uncinate spurring and facet hypertrophic changes result in mild-to-moderate multilevel neural foraminal narrowing without severe foraminal impingement in the cervical spine. Upper chest: Some apical emphysematous changes and biapical pleuroparenchymal scarring. Calcification of the proximal great vessels. Cervical carotid atherosclerosis is noted. Other: No concerning thyroid nodules or masses. Diminutive appearance of the thyroid. IMPRESSION: 1. Right frontal/supraorbital swelling and laceration extending to the temporal soft tissues as well. Thin crescentic scalp hematoma measuring up to 6 mm maximal thickness.  No subjacent calvarial fracture or other acute osseous injury. 2. No acute intracranial abnormality. 3. Background of chronic microvascular angiopathy and parenchymal volume loss. Ventricular dilatation is slightly out of proportion to the degree of sulcal prominence with narrowing of the callososeptal angle and upward bowing of the corpus callosum. Findings could suggest a normal pressure hydrocephalus though are similar to comparison imaging. Correlate with clinical symptoms. 4. Mild right malar contusion and laceration. 5. No acute facial bone fracture. 6. Chronic appearing deformities of the nasal bones. 7. Postsurgical changes prior sinus surgery. Residual pneumatized secretions and thickening within the posterior ethmoids on the left with slightly hyperostotic changes likely reflecting an acute on chronic sinusitis. 8. No evidence of acute fracture or traumatic listhesis of the cervical spine. 9. Diffuse cervical spondylitic changes with multifactorial mild canal stenosis and mild-to-moderate multilevel foraminal narrowing as described above. 10. Cervical and intracranial atherosclerosis. 11.  Emphysema (ICD10-J43.9). Electronically Signed   By: Lovena Le M.D.   On: 01/02/2020 04:19      Assessment and Plan:   Permanent Afib, now in RVR - PTA cardizem 240mg  daily. diltiazem held yesterday for surgery - This morning Afib RVR with rates in the 130s, given IV cardizem 10 mg with improvement to 106 bpm. BP stable. Hgb also noted to be 7.2, which is down from baseline - check EKG - Echo 10/2018 showed preserved EF - Diltiazem 240mg  restarted today. BP stable, most recent 128/71. Agree with re-starting diltiazem. Can increase to 360 mg daily in the interim for better rate control if pressures allow. MD to see - okay to resume coumadin per surgery  Acute right hip fracture - s/p repair 11/21 by orthopedics - Hgb 7.2, 2 units PRBS ordered - per ortho okay to resume coumadin  Leukocytosis - suspected reactive since no obvious signs of infection  DM2 - Hgb A1C 6.5 06/2019 - diet controlled  CKD stage 3 - baseline 1.5  HLD - continue crestor - LDL 63  CAD s/p CABG in 2015 - denies no chest pain - continue statin  HTN - cardizem restarted as above - Bps stable  S/p AVR - last echo in 2020 showed mead gradient 63mmHg - likely due for repeat echo however can be done as OP   For questions or updates, please contact [facility] Please consult [email or web address] for contact info under   Signed, Cadence Ninfa Meeker, PA-C  01/11/2020 9:51 AM   The patient was seen, examined and discussed with Cadence Ninfa Meeker, PA-C   and I agree with the above.   86 y.o. 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 Tana Coast. Ech in  10/2018 showed LVEF 60-65%, severely dilated LA and moderately dilated RA, mod MR with mild stenosis, mild aortic dilation and stable AVR with mean gradient  33mmHg. He has a history of permanent Afib rate controleld on diltiazem.  The patient was admitted 01/11/2020 for right hip fracture secondary to mechanical fall. Also had scalp contusion and facial laceration. He underwent right hip surgery on 12/27/2019.  He is p.o. Cardizem was held and he develop A. fib with RVR with heart rates up to 130s,  he was started on IV Cardizem 10 mg with improvement of his heart rates, as resolved his IV Cardizem was discontinued.  On physical exam he seems to be comfortable, his JVD are slightly elevated at 4 cm, he has S1-S2 with 3 out of 6 systolic murmur and irregular rhythm, his lungs are clear, lower extremities are warm and have good peripheral pulses.  Assessment and plan:   Permanent atrial fibrillation, now with RVR -secondary to holding his home p.o. Cardizem, pain and anemia, he received 1 bag of packed red blood cells this morning, IV Cardizem can be discontinued as his telemetry now shows improvement of heart rates in the range from 90 to 100 bpm, I would restart his home Cardizem CD 240 mg daily, we will monitor his telemetry closely, restart home warfarin, pharmacy to dose, he is going to be ambulated later today, overall he looks great he is asymptomatic and has no signs of heart failure.  Ena Dawley, MD 01/11/2020   Addendum:  The nurse from the floor called with patient's heart rate is back up to 130 bpm, we will restart Cardizem drip starting at 10 mg/h and uptitrate as needed to keep heart rate below 100 BPM.  Ena Dawley, MD 01/11/2020

## 2020-01-12 ENCOUNTER — Inpatient Hospital Stay (HOSPITAL_COMMUNITY): Payer: Medicare Other

## 2020-01-12 DIAGNOSIS — I4891 Unspecified atrial fibrillation: Secondary | ICD-10-CM | POA: Diagnosis not present

## 2020-01-12 DIAGNOSIS — S0003XA Contusion of scalp, initial encounter: Secondary | ICD-10-CM | POA: Diagnosis not present

## 2020-01-12 DIAGNOSIS — S72141A Displaced intertrochanteric fracture of right femur, initial encounter for closed fracture: Secondary | ICD-10-CM | POA: Diagnosis not present

## 2020-01-12 DIAGNOSIS — I2510 Atherosclerotic heart disease of native coronary artery without angina pectoris: Secondary | ICD-10-CM | POA: Diagnosis not present

## 2020-01-12 DIAGNOSIS — S72001A Fracture of unspecified part of neck of right femur, initial encounter for closed fracture: Secondary | ICD-10-CM | POA: Diagnosis not present

## 2020-01-12 LAB — TYPE AND SCREEN
ABO/RH(D): O POS
Antibody Screen: NEGATIVE
Unit division: 0
Unit division: 0

## 2020-01-12 LAB — BPAM RBC
Blood Product Expiration Date: 202112222359
Blood Product Expiration Date: 202112232359
ISSUE DATE / TIME: 202111220837
ISSUE DATE / TIME: 202111221226
Unit Type and Rh: 5100
Unit Type and Rh: 5100

## 2020-01-12 LAB — BASIC METABOLIC PANEL
Anion gap: 11 (ref 5–15)
BUN: 28 mg/dL — ABNORMAL HIGH (ref 8–23)
CO2: 18 mmol/L — ABNORMAL LOW (ref 22–32)
Calcium: 8.2 mg/dL — ABNORMAL LOW (ref 8.9–10.3)
Chloride: 106 mmol/L (ref 98–111)
Creatinine, Ser: 1.26 mg/dL — ABNORMAL HIGH (ref 0.61–1.24)
GFR, Estimated: 55 mL/min — ABNORMAL LOW (ref >60)
Glucose, Bld: 127 mg/dL — ABNORMAL HIGH (ref 70–99)
Potassium: 4 mmol/L (ref 3.5–5.1)
Sodium: 135 mmol/L (ref 135–145)

## 2020-01-12 LAB — CBC
HCT: 23.8 % — ABNORMAL LOW (ref 39.0–52.0)
Hemoglobin: 8.1 g/dL — ABNORMAL LOW (ref 13.0–17.0)
MCH: 30.3 pg (ref 26.0–34.0)
MCHC: 34 g/dL (ref 30.0–36.0)
MCV: 89.1 fL (ref 80.0–100.0)
Platelets: 99 10*3/uL — ABNORMAL LOW (ref 150–400)
RBC: 2.67 MIL/uL — ABNORMAL LOW (ref 4.22–5.81)
RDW: 13.8 % (ref 11.5–15.5)
WBC: 14.5 10*3/uL — ABNORMAL HIGH (ref 4.0–10.5)
nRBC: 0 % (ref 0.0–0.2)

## 2020-01-12 LAB — PROTIME-INR
INR: 1.6 — ABNORMAL HIGH (ref 0.8–1.2)
Prothrombin Time: 18.2 seconds — ABNORMAL HIGH (ref 11.4–15.2)

## 2020-01-12 LAB — LACTIC ACID, PLASMA: Lactic Acid, Venous: 2.1 mmol/L (ref 0.5–1.9)

## 2020-01-12 LAB — PROCALCITONIN: Procalcitonin: 0.19 ng/mL

## 2020-01-12 MED ORDER — SODIUM CHLORIDE 0.9 % IV SOLN
12.5000 mg | Freq: Four times a day (QID) | INTRAVENOUS | Status: DC | PRN
  Administered 2020-01-12 – 2020-01-14 (×3): 12.5 mg via INTRAVENOUS
  Filled 2020-01-12 (×5): qty 0.5

## 2020-01-12 MED ORDER — HYDROCODONE-ACETAMINOPHEN 5-325 MG PO TABS: 1.0000 | ORAL_TABLET | ORAL | 0 refills | Status: AC | PRN

## 2020-01-12 MED ORDER — SODIUM CHLORIDE 0.9 % IV SOLN
1.0000 g | INTRAVENOUS | Status: DC
  Administered 2020-01-12 – 2020-01-13 (×2): 1 g via INTRAVENOUS
  Filled 2020-01-12 (×2): qty 1

## 2020-01-12 MED ORDER — VANCOMYCIN HCL IN DEXTROSE 1-5 GM/200ML-% IV SOLN
1000.0000 mg | Freq: Once | INTRAVENOUS | Status: AC
  Administered 2020-01-12: 1000 mg via INTRAVENOUS
  Filled 2020-01-12: qty 200

## 2020-01-12 MED ORDER — LACTATED RINGERS IV SOLN: INTRAVENOUS | Status: DC

## 2020-01-12 MED ORDER — WARFARIN SODIUM 3 MG PO TABS
3.0000 mg | ORAL_TABLET | Freq: Once | ORAL | Status: AC
  Administered 2020-01-12: 3 mg via ORAL
  Filled 2020-01-12: qty 1

## 2020-01-12 NOTE — Progress Notes (Signed)
[place] for warfarin and lovenox Indication: atrial fibrillation and AVR  Allergies  Allergen Reactions  . Iodinated Diagnostic Agents Hives    Other reaction(s): RASH Other reaction(s): RASH  . Ioxaglate Hives    Other reaction(s): RASH    Patient Measurements: Height: 6\' 2"  (188 cm) Weight: 65.8 kg (145 lb) IBW/kg (Calculated) : 82.2 Heparin Dosing Weight:   Vital Signs: Temp: 99.1 F (37.3 C) (11/23 0515) Temp Source: Oral (11/22 2113) BP: 95/38 (11/23 0515) Pulse Rate: 73 (11/23 0515)  Labs: Recent Labs    01/05/2020 0519 01/09/2020 0519 01/11/20 0500 01/11/20 0500 01/11/20 2226 01/12/20 0440  HGB 8.8*   < > 7.2*   < > 8.1* 8.1*  HCT 27.1*   < > 22.5*  --  24.2* 23.8*  PLT 184  --  146*  --   --  99*  APTT 46*  --   --   --   --   --   LABPROT 19.2*  --  17.8*  --   --  18.2*  INR 1.7*  --  1.5*  --   --  1.6*  CREATININE 1.59*  --  1.62*  --   --  1.26*   < > = values in this interval not displayed.    Estimated Creatinine Clearance: 38.4 mL/min (A) (by C-G formula based on SCr of 1.26 mg/dL (H)).   Medications:  - PTA warfarin regimen: 2mg  daily  Assessment: Patient is an 86 y.o M with hx afib and AVR on warfarin PTA, presented to the ED on 11/19 s/p fall with right hip fracture.  He underwent right femur intramedullary fixation on 11/21. Warfarin and lovenox 0.5mg /kg q12h started post-op on 11/21.  - 11/20: vit K 10mg  PO x1 - 11/22: 2 units PRBC  Today, 01/12/2020: - INR is sub-therapeutic at 1.6 - hgb and plts low (likely secondary to ABLA) - scr down 1.26 today (crcl~38) - no signif. drug-drug intxns - in afib   Goal of Therapy:  INR 2-3 Monitor platelets by anticoagulation protocol: Yes   Plan:  - repeat warfarin 3mg  PO x1 today - continue lovenox 0.5mg /kg q12h. Monitor renal function closely - monitor for s/sx bleeding  Robert Richard P 01/12/2020,8:57 AM

## 2020-01-12 NOTE — Progress Notes (Signed)
    Subjective:  Patient reports pain as mild to moderate.  Denies N/V/CP/SOB.   Objective:   VITALS:   Vitals:   01/11/20 2113 01/11/20 2317 01/12/20 0113 01/12/20 0515  BP: (!) 103/53 (!) 92/49 (!) 92/42 (!) 95/38  Pulse: (!) 112 (!) 110 92 73  Resp: 18 18 18 16   Temp: 99.2 F (37.3 C) 100 F (37.8 C) 99.6 F (37.6 C) 99.1 F (37.3 C)  TempSrc: Oral     SpO2: 98% 96% 95% 98%  Weight:      Height:       Pleasantly confused and sleepy NAD ABD soft Neurovascular intact Sensation intact distally Intact pulses distally Dorsiflexion/Plantar flexion intact Incision: dressing C/D/I   Lab Results  Component Value Date   WBC 14.5 (H) 01/12/2020   HGB 8.1 (L) 01/12/2020   HCT 23.8 (L) 01/12/2020   MCV 89.1 01/12/2020   PLT 99 (L) 01/12/2020   BMET    Component Value Date/Time   NA 135 01/12/2020 0440   NA 140 11/18/2019 1358   K 4.0 01/12/2020 0440   CL 106 01/12/2020 0440   CO2 18 (L) 01/12/2020 0440   GLUCOSE 127 (H) 01/12/2020 0440   BUN 28 (H) 01/12/2020 0440   BUN 17 11/18/2019 1358   CREATININE 1.26 (H) 01/12/2020 0440   CREATININE 1.10 06/15/2013 1136   CALCIUM 8.2 (L) 01/12/2020 0440   GFRNONAA 55 (L) 01/12/2020 0440   GFRNONAA 63 06/15/2013 1136   GFRAA 46 (L) 11/18/2019 1358   GFRAA 72 06/15/2013 1136     Assessment/Plan: 2 Days Post-Op   Principal Problem:   Closed right hip fracture, initial encounter (HCC) Active Problems:   Hyperlipidemia   CKD (chronic kidney disease)   CAD (coronary artery disease)   Diabetes mellitus without complication (HCC)   GERD (gastroesophageal reflux disease)   Parkinson's disease ([place])   Facial laceration   Leukocytosis   Hyperglycemia   Fall at home, initial encounter   WBAT with walker DVT ppx: Coumadin, SCDs, TEDS PO pain control ABLA: Treat per hospitalist recommendations PT/OT Dispo: D/C pending per hospitalist and PT recommendations    Dorothyann Peng 01/12/2020, 8:01 AM  [facility] is now [address], [place], [place] [address] Phone: [phone] [email or web address] Facebook  [employer]

## 2020-01-12 NOTE — Evaluation (Signed)
Physical Therapy Evaluation Patient Details Name: Robert Richard MRN: [id number] DOB: 1930/10/25 Today's Date: 01/12/2020   History of Present Illness  86 yo male admitted with R hip fracture after falling at home. S/P IM nail 12/22/2019. Transferred to progressive unit after having Afib with RVR. Hx of Parkinson's, A fib, DM, COPD, gout, RLS, CABG, osteoporosis, CKD  Clinical Impression  On eval, pt required Total assist +2 for mobility. He sat EOB with Mod assist for static sitting balance. Attempted sit to stand x 3 with use of STEDY-pt unable to stand upright or long enough to get flaps closed. Pt required multimodal cueing throughout session. He appears somewhat lethargic and confused. Wife thinks it could be due to medications?? Will continue to follow and progress activity as tolerated.     Follow Up Recommendations SNF    Equipment Recommendations  None recommended by PT    Recommendations for Other Services       Precautions / Restrictions Precautions Precautions: Fall Restrictions Weight Bearing Restrictions: No RUE Weight Bearing: Weight bearing as tolerated      Mobility  Bed Mobility Overal bed mobility: Needs Assistance Bed Mobility: Supine to Sit;Sit to Supine     Supine to sit: Total assist;+2 for physical assistance;+2 for safety/equipment;HOB elevated Sit to supine: Total assist;+2 for physical assistance;+2 for safety/equipment;HOB elevated   General bed mobility comments: Assist for trunk and bil LEs. Utilized bedpad for scooting, positioning. Multimodal cueing required. Heavy lean to L side-pt propped on elbow    Transfers Overall transfer level: Needs assistance   Transfers: Sit to/from Stand Sit to Stand: Total assist;+2 physical assistance;+2 safety/equipment;From elevated surface         General transfer comment: 3 attempts to stand. Pt unable to stand fully upright and long enough to get flaps closed. Unable to transfer to recliner on  today.  Ambulation/Gait                Stairs            Wheelchair Mobility    Modified Rankin (Stroke Patients Only)       Balance Overall balance assessment: Needs assistance;History of Falls   Sitting balance-Leahy Scale: Poor Sitting balance - Comments: Heavy L lateral lean. Pt able sit upright with Mod assistance. Postural control: Left lateral lean                                   Pertinent Vitals/Pain Pain Assessment: Faces Faces Pain Scale: Hurts worst Pain Location: R LE with mobility Pain Descriptors / Indicators: Moaning;Grimacing;Operative site guarding;Sharp Pain Intervention(s): Limited activity within patient's tolerance;Monitored during session;Repositioned    Home Living Family/patient expects to be discharged to:: Skilled nursing facility Living Arrangements: Spouse/significant other Available Help at Discharge: Family;Available 24 hours/day Type of Home: House Home Access: Stairs to enter Entrance Stairs-Rails: Left Entrance Stairs-Number of Steps: 3 Home Layout: Laundry or work area in basement;Able to live on main level with bedroom/bathroom Home Equipment: Kasandra Knudsen - single point;Walker - 2 wheels      Prior Function Level of Independence: Independent with assistive device(s)   Gait / Transfers Assistance Needed: using cane for ambulation           Hand Dominance        Extremity/Trunk Assessment   Upper Extremity Assessment Upper Extremity Assessment: Defer to OT evaluation    Lower Extremity Assessment Lower Extremity Assessment: Generalized weakness  Communication   Communication: No difficulties  Cognition Arousal/Alertness: Suspect due to medications;Lethargic Behavior During Therapy: WFL for tasks assessed/performed Overall Cognitive Status: Difficult to assess                                        General Comments      Exercises     Assessment/Plan    PT  Assessment Patient needs continued PT services  PT Problem List Decreased strength;Decreased mobility;Decreased range of motion;Decreased activity tolerance;Decreased balance;Decreased cognition;Decreased knowledge of use of DME;Pain       PT Treatment Interventions DME instruction;Gait training;Therapeutic activities;Therapeutic exercise;Patient/family education;Balance training;Functional mobility training    PT Goals (Current goals can be found in the Care Plan section)  Acute Rehab PT Goals Patient Stated Goal: pt unable to state. wife present-agreeable to rehab PT Goal Formulation: With family Time For Goal Achievement: 02-02-20 Potential to Achieve Goals: Fair    Frequency Min 2X/week   Barriers to discharge Decreased caregiver support      Co-evaluation               AM-PAC PT "6 Clicks" Mobility  Outcome Measure Help needed turning from your back to your side while in a flat bed without using bedrails?: Total Help needed moving from lying on your back to sitting on the side of a flat bed without using bedrails?: Total Help needed moving to and from a bed to a chair (including a wheelchair)?: Total Help needed standing up from a chair using your arms (e.g., wheelchair or bedside chair)?: Total Help needed to walk in hospital room?: Total Help needed climbing 3-5 steps with a railing? : Total 6 Click Score: 6    End of Session   Activity Tolerance: Patient limited by pain;Patient limited by lethargy Patient left: in bed;with call bell/phone within reach;with family/visitor present;with nursing/sitter in room   PT Visit Diagnosis: Pain;Muscle weakness (generalized) (M62.81);History of falling (Z91.81);Difficulty in walking, not elsewhere classified (R26.2) Pain - Right/Left: Right Pain - part of body: Hip    Time: 5329-9242 PT Time Calculation (min) (ACUTE ONLY): 37 min   Charges:   PT Evaluation $PT Eval Moderate Complexity: 1 Mod PT  Treatments $Therapeutic Activity: 8-22 mins         Doreatha Massed, PT Acute Rehabilitation  Office: [phone] Pager: [phone]

## 2020-01-12 NOTE — Progress Notes (Addendum)
Progress Note  Patient Name: Robert Richard Date of Encounter: 01/12/2020  [facility] Cardiologist: Dorris Carnes, MD   Subjective   In afib with rates better controlled, back on home Cardizem. Soft pressures overnight. Hgb 8.1 this morning. Patient denies chest pain.   Inpatient Medications    Scheduled Meds: . sodium chloride   Intravenous Once  . carbidopa-levodopa  1 tablet Oral TID  . Chlorhexidine Gluconate Cloth  6 each Topical Daily  . diltiazem  240 mg Oral Daily  . docusate sodium  100 mg Oral BID  . enoxaparin (LOVENOX) injection  30 mg Subcutaneous Q12H  . feeding supplement  237 mL Oral BID BM  . gabapentin  100 mg Oral QHS  . ketotifen  1 drop Both Eyes Daily  . multivitamin with minerals   Oral Daily  . pantoprazole  40 mg Oral Daily  . rosuvastatin  2.5 mg Oral QODAY  . topiramate  75 mg Oral Daily  . Warfarin - Pharmacist Dosing Inpatient   Does not apply q1600   Continuous Infusions: . sodium chloride 100 mL/hr at 12/23/2019 1552   PRN Meds: acetaminophen, menthol-cetylpyridinium **OR** phenol, metoCLOPramide **OR** metoCLOPramide (REGLAN) injection, morphine injection, ondansetron **OR** ondansetron (ZOFRAN) IV   Vital Signs    Vitals:   01/11/20 2113 01/11/20 2317 01/12/20 0113 01/12/20 0515  BP: (!) 103/53 (!) 92/49 (!) 92/42 (!) 95/38  Pulse: (!) 112 (!) 110 92 73  Resp: 18 18 18 16   Temp: 99.2 F (37.3 C) 100 F (37.8 C) 99.6 F (37.6 C) 99.1 F (37.3 C)  TempSrc: Oral     SpO2: 98% 96% 95% 98%  Weight:      Height:        Intake/Output Summary (Last 24 hours) at 01/12/2020 0739 Last data filed at 01/12/2020 0559 Gross per 24 hour  Intake 966 ml  Output 850 ml  Net 116 ml   Last 3 Weights 01/15/2020 01/18/2020 10/23/2019  Weight (lbs) 145 lb 134 lb 14.7 oz 135 lb  Weight (kg) 65.772 kg 61.2 kg 61.236 kg      Telemetry    Afib HR 70s - Personally Reviewed  ECG    No new - Personally Reviewed  Physical Exam   GEN: No  acute distress.   Neck: No JVD Cardiac: Irreg Irreg, no murmurs, rubs, or gallops.  Respiratory: Clear to auscultation bilaterally. GI: Soft, nontender, non-distended  MS: No edema; No deformity. Neuro:  Nonfocal  Psych: Normal affect   Labs    High Sensitivity Troponin:  No results for input(s): TROPONINIHS in the last 720 hours.    Chemistry Recent Labs  Lab 01/01/2020 0053 01/15/2020 0053 12/28/2019 0519 01/11/20 0500 01/12/20 0440  NA 137   < > 140 137 135  K 4.5   < > 4.7 4.5 4.0  CL 108   < > 110 107 106  CO2 21*   < > 19* 20* 18*  GLUCOSE 162*   < > 139* 157* 127*  BUN 20   < > 28* 32* 28*  CREATININE 1.44*   < > 1.59* 1.62* 1.26*  CALCIUM 8.9   < > 8.9 8.3* 8.2*  PROT 7.3  --  7.1  --   --   ALBUMIN 3.5  --  3.5  --   --   AST 20  --  22  --   --   ALT 13  --  <5  --   --   [facility]  82  --  75  --   --   BILITOT 0.6  --  0.9  --   --   GFRNONAA 47*   < > 42* 41* 55*  ANIONGAP 8   < > 11 10 11    < > = values in this interval not displayed.     Hematology Recent Labs  Lab 12/22/2019 0519 01/06/2020 0519 01/11/20 0500 01/11/20 2226 01/12/20 0440  WBC 11.4*  --  14.5*  --  14.5*  RBC 2.93*  --  2.41*  --  2.67*  HGB 8.8*   < > 7.2* 8.1* 8.1*  HCT 27.1*   < > 22.5* 24.2* 23.8*  MCV 92.5  --  93.4  --  89.1  MCH 30.0  --  29.9  --  30.3  MCHC 32.5  --  32.0  --  34.0  RDW 13.3  --  13.5  --  13.8  PLT 184  --  146*  --  99*   < > = values in this interval not displayed.    BNPNo results for input(s): BNP, PROBNP in the last 168 hours.   DDimer No results for input(s): DDIMER in the last 168 hours.   Radiology    Pelvis Portable  Result Date: 12/22/2019 CLINICAL DATA:  Status post right hip fracture repair EXAM: PORTABLE PELVIS 1-2 VIEWS COMPARISON:  None. FINDINGS: The right hip fracture is been repaired with a gamma nail and intramedullary rod with a distal interlocking screw. Postoperative changes noted. No other acute abnormalities. IMPRESSION: Right hip  fracture repair as above. Electronically Signed   By: Dorise Bullion III M.D   On: 01/13/2020 11:22   DG C-Arm 1-60 Min-No Report  Result Date: 01/02/2020 Fluoroscopy was utilized by the requesting physician.  No radiographic interpretation.   DG HIP OPERATIVE UNILAT W OR W/O PELVIS RIGHT  Result Date: 01/19/2020 CLINICAL DATA:  Right hip fracture repair EXAM: OPERATIVE RIGHT HIP (WITH PELVIS IF PERFORMED)  VIEWS TECHNIQUE: Fluoroscopic spot image(s) were submitted for interpretation post-operatively. FLUOROSCOPY TIME:  40 seconds. Images: Two COMPARISON:  None. FINDINGS: A gamma nail and intramedullary femoral rod have been placed across the right hip fracture. Alignment is improved in the interval. No dislocation. A distal interlocking screw is noted. IMPRESSION: Right hip fracture repair as above. Electronically Signed   By: Dorise Bullion III M.D   On: 01/14/2020 11:22    Cardiac Studies   Echo 10/2018 1. The left ventricle has normal systolic function with an ejection  fraction of 60-65%. The cavity size was normal. There is mildly increased  left ventricular wall thickness. Left ventricular diastolic function could  not be evaluated.  2. Left atrial size was severely dilated.  3. Right atrial size was moderately dilated.  4. The mitral valve is abnormal. There is severe mitral annular  calcification present. Mitral valve regurgitation is moderate by color  flow Doppler. Mild mitral valve stenosis.  5. The pulmonic valve was grossly normal. Pulmonic valve regurgitation is  not visualized by color flow Doppler.  6. The aorta is abnormal unless otherwise noted.  7. There is mild dilatation of the ascending aorta measuring 39 mm.   Holter monitor 05/2015 Atrial fibrillation, 66 to 143 bpm  Average HR 86 bpm  Rare PVC    Patient Profile     86 y.o. 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 diltiazem held yesterday for surgery - Post-op the patient went into Afib RVR with rates in the 130s, given IV cardizem 10 mg with improvement to 106 bpm. Hgb also noted to be 7.2 s/p 2 units PRBCs - Echo 10/2018 showed preserved EF - Diltiazem 240mg  restarted however rates were up to 130 and IV dilt was ordered but it does not look like this was started. He was given IV 10mg  and plan for home cardizem 240mg  this morning. Afib rates are controlled.  - coumadin per pharmacy  Acute right hip fracture - s/p repair 11/21 by orthopedics - Hgb 7.2 s/p 2 units PRBS ordered. Hgb today 8.1 - per ortho   Leukocytosis - suspected reactive since no obvious signs of infection  DM2 - Hgb A1C 6.5 06/2019 - diet controlled  CKD stage 3 - baseline 1.5 - creatinine stable  HLD - continue crestor - LDL 63  CAD s/p CABG in 2015 - denies no chest pain - continue statin  HTN - cardizem restarted as above - Bps stable  S/p AVR - last echo in 2020 showed mead gradient 64mmHg - likely due for repeat echo however can be done as OP  For questions or updates, please contact [facility] Please consult [email or web address] for contact info under     Signed, Cadence Ninfa Meeker, PA-C  01/12/2020, 7:39 AM    The patient was seen, examined and discussed with Cadence Ninfa Meeker, PA-C   and I agree with the above.   Patient's heart rate up last night requiring reinitiation of IV Cardizem drip, now off, on home Cardizem CD 240 mg daily, ventricular rates in 60s to 70s overnight and up to 100 during awake hours, the patient received 1 unit of packed red blood cells yesterday with improvement of hemoglobin now 8.1, he  appears dehydrated, he is encouraged to increase his p.o. intake, including plenty of fluids.  There is no room to increase p.o. Cardizem as he tends to be hypotensive but I think this will improve with p.o. fluid intake. We will continue to monitor, if needed we can add p.o. digoxin. Anticipated discharge tomorrow.  Ena Dawley, MD 01/12/2020

## 2020-01-12 NOTE — Progress Notes (Signed)
Pharmacy Note   Pharmacy received a consult for vancomycin and the consult states " FUO, please give one dose."    Therefore will order a one time order for vancomycin 1000 mg IV and will then discontinue vancomycin consult as the order is to be for one time.   Royetta Asal, PharmD, BCPS 01/12/2020 8:03 PM

## 2020-01-12 NOTE — Care Management Important Message (Signed)
Important Message  Patient Details IM Letter given to the Patient. Name: Robert Richard MRN: [id number] Date of Birth: [birth year]   Medicare Important Message Given:  Yes     Kerin Salen 01/12/2020, 10:56 AM

## 2020-01-12 NOTE — Evaluation (Signed)
Occupational Therapy Evaluation Patient Details Name: Robert Richard MRN: [id number] DOB: 1930-07-05 Today's Date: 01/12/2020    History of Present Illness 86 yo male admitted with R hip fracture after falling at home. S/P IM nail 01/19/2020. Transferred to progressive unit after having Afib with RVR. Hx of Parkinson's, A fib, DM, COPD, gout, RLS, CABG, osteoporosis, CKD   Clinical Impression   Pt. Was seen for skilled OT evaluation. Pt. Has decreased ability  To care for himself. He lives with elderly wife who can not assist at current level of function. Discussed with pt. And wife that he would benefit from a short SNF stay to increase ability with self care and mobility. Pt. Is now requiring 2 person assist for bed mobility and sit to stand. Pt. Is not able to transfer currently. Pt. To be seen for skilled OT. To maximze I and safety.     Follow Up Recommendations  SNF    Equipment Recommendations  None recommended by OT    Recommendations for Other Services       Precautions / Restrictions Precautions Precautions: Fall Restrictions Weight Bearing Restrictions: No RUE Weight Bearing: Weight bearing as tolerated      Mobility Bed Mobility Overal bed mobility: Needs Assistance Bed Mobility: Supine to Sit;Sit to Supine     Supine to sit: Total assist;+2 for physical assistance;+2 for safety/equipment;HOB elevated Sit to supine: Total assist;+2 for physical assistance;+2 for safety/equipment;HOB elevated   General bed mobility comments: Assist for trunk and bil LEs. Utilized bedpad for scooting, positioning. Multimodal cueing required. Heavy lean to L side-pt propped on elbow    Transfers Overall transfer level: Needs assistance   Transfers: Sit to/from Stand Sit to Stand: Total assist;+2 physical assistance;+2 safety/equipment;From elevated surface         General transfer comment: unable    Balance Overall balance assessment: Needs assistance;History of Falls    Sitting balance-Leahy Scale: Poor Sitting balance - Comments: Heavy L lateral lean. Pt able sit upright with Mod assistance. Postural control: Left lateral lean                                 ADL either performed or assessed with clinical judgement   ADL Overall ADL's : Needs assistance/impaired Eating/Feeding: Set up;Sitting   Grooming: Wash/dry hands;Wash/dry face;Set up;Sitting   Upper Body Bathing: Minimal assistance;Sitting   Lower Body Bathing: Total assistance;Bed level   Upper Body Dressing : Minimal assistance;Sitting   Lower Body Dressing: Total assistance;Bed level   Toilet Transfer:  (unable)             General ADL Comments: Pt. is requiring extensive assist with LE ADLs at this time.      Vision Baseline Vision/History: Wears glasses Wears Glasses: At all times Patient Visual Report: No change from baseline Vision Assessment?: No apparent visual deficits     Perception     Praxis      Pertinent Vitals/Pain Pain Assessment: No/denies pain Faces Pain Scale: Hurts worst Pain Location: R LE with mobility Pain Descriptors / Indicators: Moaning;Grimacing;Operative site guarding;Sharp Pain Intervention(s): Limited activity within patient's tolerance;Monitored during session;Repositioned     Hand Dominance Right   Extremity/Trunk Assessment Upper Extremity Assessment Upper Extremity Assessment: Generalized weakness   Lower Extremity Assessment Lower Extremity Assessment: Defer to PT evaluation       Communication Communication Communication: No difficulties   Cognition Arousal/Alertness: Suspect due to medications;Lethargic Behavior During Therapy:  WFL for tasks assessed/performed Overall Cognitive Status: Difficult to assess                                     General Comments       Exercises     Shoulder Instructions      Home Living Family/patient expects to be discharged to:: Skilled nursing  facility Living Arrangements: Spouse/significant other Available Help at Discharge: Family;Available 24 hours/day Type of Home: House Home Access: Stairs to enter [employer] of Steps: 3 Entrance Stairs-Rails: Left Home Layout: Laundry or work area in basement;Able to live on main level with bedroom/bathroom               Home Equipment: Kasandra Knudsen - single point;Walker - 2 wheels;Shower seat          Prior Functioning/Environment Level of Independence: Independent with assistive device(s)  Gait / Transfers Assistance Needed: using cane for ambulation     Comments: Pt. was able to perform adls at Mod I level. Pt. wife did the cooking and cleaning        OT Problem List: Decreased activity tolerance;Decreased safety awareness;Decreased knowledge of use of DME or AE      OT Treatment/Interventions: Self-care/ADL training;DME and/or AE instruction;Therapeutic activities;Patient/family education    OT Goals(Current goals can be found in the care plan section) Acute Rehab OT Goals Patient Stated Goal: to walk OT Goal Formulation: With patient Time For Goal Achievement: January 31, 2020 Potential to Achieve Goals: Good ADL Goals Pt Will Perform Grooming: with supervision;standing Pt Will Perform Upper Body Bathing: sitting;with supervision;with set-up Pt Will Perform Lower Body Bathing: with min assist;sit to/from stand Pt Will Perform Upper Body Dressing: with modified independence;sitting Pt Will Perform Lower Body Dressing: with min assist;sit to/from stand Pt Will Transfer to Toilet: with min assist;ambulating Pt Will Perform Toileting - Clothing Manipulation and hygiene: with min assist;sit to/from stand  OT Frequency: Min 2X/week   Barriers to D/C: Decreased caregiver support          Co-evaluation              AM-PAC OT "6 Clicks" Daily Activity     Outcome Measure Help from another person eating meals?: None Help from another person taking care of  personal grooming?: A Little Help from another person toileting, which includes using toliet, bedpan, or urinal?: Total Help from another person bathing (including washing, rinsing, drying)?: A Lot Help from another person to put on and taking off regular upper body clothing?: A Little Help from another person to put on and taking off regular lower body clothing?: Total 6 Click Score: 14   End of Session Nurse Communication:  (ok therapy)  Activity Tolerance: Patient tolerated treatment well Patient left: in bed;with call bell/phone within reach;with family/visitor present  OT Visit Diagnosis: Unsteadiness on feet (R26.81);History of falling (Z91.81)                Time: 0034-9179 OT Time Calculation (min): 30 min Charges:  OT General Charges $OT Visit: 1 Visit OT Evaluation $OT Eval Moderate Complexity: 1 Mod  Ariatna Jester OT/L   Ameilia Rattan 01/12/2020, 3:02 PM

## 2020-01-12 NOTE — Progress Notes (Addendum)
[facility]                                                                              Patient Demographics  Robert Richard, is a 86 y.o. male, DOB - 07-15-1930, PFX:[id number]  Admit date - 01/15/2020   Admitting Physician Bernadette Hoit, DO  Outpatient Primary MD for the patient is [name], MD  Outpatient specialists:   LOS - 3  days   Medical records reviewed and are as summarized below:    Chief Complaint  Patient presents with   Fall   Leg Injury       Brief summary   Patient is 86yo patient with CAD, hyperlipidemia, A. fib on warfarin, GERD, CKD stage III, Parkinson's disease presented to ED after sustaining a fall at home.  History per his wife, patient had gotten out of the shower and while he was in the bathroom, he fell and landed on the right side of his face, right hip.  Patient sustained facial laceration with scalp contusion.  He also complained of right hip pain with difficulty in being able to bear weight on the right leg due to pain.  EMS was called. In ED BP was soft-2/52, and leukocytosis with normocytic anemia.  Creatinine 1.4.  CT imaging showed no acute intracranial abnormality but showed right frontal/supraorbital swelling and laceration extending to the temporal soft tissues, mild right liver contusion and laceration.  No facial bone fracture. Hip x-ray showed right hip fracture Patient was transferred to [facility] for further work-up  Assessment & Plan    Principal Problem: Acute comminuted closed right hip fracture, after mechanical fall at home [facility]) -Right hip x-ray showed acute comminuted intertrochanteric fracture of the right hip with superior subluxation -Underwent intramedullary fixation of the right femur (Dr [name]), postop day #2 -Complicated by anemia and A. fib with RVR on 11/22, doing well today, will start PT  Active Problems: Atrial fibrillation with RVR -Postop in atrial fibrillation with RVR on 11/22  requiring digoxin, IV Cardizem. Resumed oral Cardizem 240 mg daily -Coumadin per pharmacy, cardiology following  Acute on chronic blood loss anemia, postop -Hemoglobin 7.2 on 11/22, status post 2 units packed RBC transfusion -Hemoglobin 8.1  Right frontal/supraorbital swelling and laceration -Continue wound care, no active bleeding  Leukocytosis possibly reactive WBC 16.4 at the time of admission, no obvious signs of any acute infectious process  -Likely reactive, currently afebrile, no acute signs of infection  Diabetes mellitus type 2, diet controlled -Hemoglobin A1c 6.5 08/09/2019 -Continue carb modified diet  CKD stage IIIa -Baseline creatinine ~1.5 -Avoid nephrotoxic medications, hypotension -Likely worsened today to 1.6 on 11/22 due to anemia, rapid A. fib with RVR -Creatinine now improved to 1.2  CAD, hyperlipidemia Continue Crestor  GERD -Continue Protonix   Parkinson's disease Continue Sinemet  Underweight, protein calorie malnutrition Estimated body mass index is 18.62 kg/m as calculated from the following:   Height as of this encounter: 6\' 2"  (1.88 m).   Weight as of this encounter: 65.8 kg.  Nutritional supplements   Addendum:  6:50pm Patient started spiking fevers, 101.59F ?PNA/atelectasis or aspirated,  Will check UA,  CXR, blood cx x2, lactic acid and procalcitonin - will give vanc x1 and IV rocephin, descalate in am, if no systemic infection   Code Status: Full code DVT Prophylaxis: Coumadin Family Communication: Discussed all imaging results, lab results, explained to the patient's wife at the bedside on 11/22   Disposition Plan:     Status is: Inpatient  Remains inpatient appropriate because:Inpatient level of care appropriate due to severity of illness   Dispo: The patient is from: Home              Anticipated d/c is to: TBD, likely will need rehab              Anticipated d/c date is: 3 days              Patient currently is not  medically stable to d/c.      Time Spent in minutes 25 minutes  Procedures:  Intramedullary right femur fracture repair on 11/21  Consultants:   Orthopedics Cardiology  Antimicrobials:   Anti-infectives (From admission, onward)   Start     Dose/Rate Route Frequency Ordered Stop   12/29/2019 1500  ceFAZolin (ANCEF) IVPB 2g/100 mL premix        2 g 200 mL/hr over 30 Minutes Intravenous Every 6 hours 01/15/2020 1113 01/02/2020 2141         Medications  Scheduled Meds:  sodium chloride   Intravenous Once   carbidopa-levodopa  1 tablet Oral TID   diltiazem  240 mg Oral Daily   docusate sodium  100 mg Oral BID   enoxaparin (LOVENOX) injection  30 mg Subcutaneous Q12H   feeding supplement  237 mL Oral BID BM   gabapentin  100 mg Oral QHS   ketotifen  1 drop Both Eyes Daily   multivitamin with minerals   Oral Daily   pantoprazole  40 mg Oral Daily   rosuvastatin  2.5 mg Oral QODAY   topiramate  75 mg Oral Daily   warfarin  3 mg Oral ONCE-1600   Warfarin - Pharmacist Dosing Inpatient   Does not apply q1600   Continuous Infusions:  sodium chloride 100 mL/hr at 12/30/2019 1552   PRN Meds:.acetaminophen, menthol-cetylpyridinium **OR** phenol, metoCLOPramide **OR** metoCLOPramide (REGLAN) injection, morphine injection, ondansetron **OR** ondansetron (ZOFRAN) IV      Subjective:   Robert Richard was seen and examined today.  Doing well today, heart rate controlled BP somewhat soft, asymptomatic.  No chest pain or shortness of breath.  Off O2.  No nausea vomiting abdominal pain or any diarrhea.   Objective:   Vitals:   01/11/20 2317 01/12/20 0113 01/12/20 0515 01/12/20 0858  BP: (!) 92/49 (!) 92/42 (!) 95/38 (!) 122/47  Pulse: (!) 110 92 73 81  Resp: 18 18 16 12   Temp: 100 F (37.8 C) 99.6 F (37.6 C) 99.1 F (37.3 C) 98.1 F (36.7 C)  TempSrc:      SpO2: 96% 95% 98% 95%  Weight:      Height:        Intake/Output Summary (Last 24 hours) at 01/12/2020  1300 Last data filed at 01/12/2020 0559 Gross per 24 hour  Intake 316 ml  Output 850 ml  Net -534 ml     Wt Readings from Last 3 Encounters:  12/27/2019 65.8 kg  10/23/19 61.2 kg  08/03/19 63.7 kg   Physical Exam  General: Alert and oriented x 3, NAD  Cardiovascular: S1 S2 clear, irregular no pedal edema b/l  Respiratory: CTAB,  no wheezing, rales or rhonchi  Gastrointestinal: Soft, nontender, nondistended, NBS  Ext: no pedal edema bilaterally  Neuro: no new deficits  Musculoskeletal: No cyanosis, clubbing  Skin: No rashes  Psych: Normal affect and demeanor, alert and oriented x3     Data Reviewed:  I have personally reviewed following labs and imaging studies  Micro Results Recent Results (from the past 240 hour(s))  Resp Panel by RT-PCR (Flu A&B, Covid) Nasopharyngeal Swab     Status: None   Collection Time: 01/17/2020 11:50 PM   Specimen: Nasopharyngeal Swab; Nasopharyngeal(NP) swabs in vial transport medium  Result Value Ref Range Status   SARS Coronavirus 2 by RT PCR NEGATIVE NEGATIVE Final    Comment: (NOTE) SARS-CoV-2 target nucleic acids are NOT DETECTED.  The SARS-CoV-2 RNA is generally detectable in upper respiratory specimens during the acute phase of infection. The lowest concentration of SARS-CoV-2 viral copies this assay can detect is 138 copies/mL. A negative result does not preclude SARS-Cov-2 infection and should not be used as the sole basis for treatment or other patient management decisions. A negative result may occur with  improper specimen collection/handling, submission of specimen other than nasopharyngeal swab, presence of viral mutation(s) within the areas targeted by this assay, and inadequate number of viral copies(<138 copies/mL). A negative result must be combined with clinical observations, patient history, and epidemiological information. The expected result is Negative.  Fact Sheet for Patients:    EntrepreneurPulse.com.au  Fact Sheet for Healthcare Providers:  IncredibleEmployment.be  This test is no t yet approved or cleared by the [place] FDA and  has been authorized for detection and/or diagnosis of SARS-CoV-2 by FDA under an Emergency Use Authorization (EUA). This EUA will remain  in effect (meaning this test can be used) for the duration of the COVID-19 declaration under Section 564(b)(1) of the Act, 21 U.S.C.section 360bbb-3(b)(1), unless the authorization is terminated  or revoked sooner.       Influenza A by PCR NEGATIVE NEGATIVE Final   Influenza B by PCR NEGATIVE NEGATIVE Final    Comment: (NOTE) The Xpert Xpress SARS-CoV-2/FLU/RSV plus assay is intended as an aid in the diagnosis of influenza from Nasopharyngeal swab specimens and should not be used as a sole basis for treatment. Nasal washings and aspirates are unacceptable for Xpert Xpress SARS-CoV-2/FLU/RSV testing.  Fact Sheet for Patients: EntrepreneurPulse.com.au  Fact Sheet for Healthcare Providers: IncredibleEmployment.be  This test is not yet approved or cleared by the [place] FDA and has been authorized for detection and/or diagnosis of SARS-CoV-2 by FDA under an Emergency Use Authorization (EUA). This EUA will remain in effect (meaning this test can be used) for the duration of the COVID-19 declaration under Section 564(b)(1) of the Act, 21 U.S.C. section 360bbb-3(b)(1), unless the authorization is terminated or revoked.  Performed at [facility], [address] [place], [place] [address]   Surgical pcr screen     Status: None   Collection Time: 01/16/2020  9:02 PM   Specimen: Nasal Mucosa; Nasal Swab  Result Value Ref Range Status   MRSA, PCR NEGATIVE NEGATIVE Final   Staphylococcus aureus NEGATIVE NEGATIVE Final    Comment: (NOTE) The Xpert SA Assay (FDA approved for NASAL specimens in patients 67 years of  age and older), is one component of a comprehensive surveillance program. It is not intended to diagnose infection nor to guide or monitor treatment. Performed at [facility], [place] [address] [place], [place] [address]     Radiology Reports DG  Chest 1 View  Result Date: 01/19/2020 CLINICAL DATA:  Initial evaluation for preoperative evaluation, hip fracture. EXAM: CHEST  1 VIEW COMPARISON:  Prior radiograph from 10/15/2017 FINDINGS: Median sternotomy wires underlying CABG markers and valvular prosthesis noted. Mild exaggeration of the cardiac silhouette related AP technique. Mediastinal silhouette normal. Aortic atherosclerosis. Lungs mildly hypoinflated. Associated mild patchy left basilar opacity, likely atelectasis. No focal infiltrates. Mild perihilar vascular congestion without overt pulmonary edema. No pleural effusion. No pneumothorax. No acute osseous finding.  Osteopenia. IMPRESSION: 1. Hypoinflation with mild patchy left basilar opacity, likely atelectasis. 2. 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 3.  Aortic Atherosclerosis (ICD10-I70.0). Electronically Signed   By: Jeannine Boga M.D.   On: 01/16/2020 02:12   CT Head Wo Contrast  Result Date: 01/02/2020 CLINICAL DATA:  Fall, right cheek skin tear and right supraorbital hematoma EXAM: CT HEAD WITHOUT CONTRAST CT MAXILLOFACIAL WITHOUT CONTRAST CT CERVICAL SPINE WITHOUT CONTRAST TECHNIQUE: Multidetector CT imaging of the head, cervical spine, and maxillofacial structures were performed using the standard protocol without intravenous contrast. Multiplanar CT image reconstructions of the cervical spine and maxillofacial structures were also generated. COMPARISON:  CT head 12/17/2014, MRI 12/12/2009, paranasal sinus CT 11/04/2009 FINDINGS: CT HEAD FINDINGS Brain: No evidence of acute infarction, hemorrhage, hydrocephalus, extra-axial collection, visible mass lesion or mass effect. Diffuse prominence  of the cisterns and sulci compatible with parenchymal volume loss. Ventricular dilatation is sat Lea out of proportion to the degree of sulcal prominence with slight narrowing of the callososeptal angle and upward bowing of the corpus callosum. Patchy areas of white matter hypoattenuation are most compatible with chronic microvascular angiopathy. Midline structures are normal. Cerebellar tonsils are normally position. Vascular: Atherosclerotic calcification of the carotid siphons and intradural vertebral arteries. No hyperdense vessel. Skull: Right frontal/supraorbital swelling and laceration extending to the temporal soft tissues as well. Thin crescentic scalp hematoma measuring up to 6 mm maximal thickness. No subjacent calvarial fracture or other acute osseous injury of the calvaria. Other: None. CT MAXILLOFACIAL FINDINGS Osseous: No fracture of the bony orbits. Chronic appearing deformities of the nasal bones similar to comparison imaging in 2011. No other mid face fractures are seen. The pterygoid plates are intact. No visible or suspected temporal bone fractures. Temporomandibular joints are normally aligned. The mandible is intact. Numerous dental amalgams and metallic orthotic hardware limit evaluation of the dentition. No clearly fractured or avulsed teeth. Orbits: Some slightly asymmetric right supraorbital swelling and palpebral thickening. No retro septal gas, stranding or hemorrhage. Bilateral lens extractions. Small amount of right sub palpebral gas is a normal finding. Sinuses: Postsurgical changes from prior antrectomies and partial ethmoidectomies. Some pneumatized secretions and thickening are present within the posterior ethmoids on the left with slightly hyperostotic changes likely reflecting an acute on chronic sinusitis. Remaining paranasal sinuses and mastoid air cells are predominantly clear. Middle ear cavities are clear. Ossicular chains are normally configured. Soft tissues: Right  periorbital and supraorbital soft tissue swelling. Mild right malar contusion and laceration as well. Question some mild pre mental thickening. Soft tissues are otherwise unremarkable without further gas or foreign body. CT CERVICAL SPINE FINDINGS Alignment: Stabilization collar is absent at the time of examination slightly exaggerated cervical lordosis. Minimal anterolisthesis C2 on 3 and retrolisthesis C3 on 4 is favored to be on a degenerative basis with spondylitic changes at these levels. No evidence of traumatic listhesis. No abnormally widened, perched or jumped facets. Normal alignment of the craniocervical articulations accounting for mild rightward cranial rotation. Effacement  of the atlantodental interval likely related to advanced arthrosis. Skull base and vertebrae: The osseous structures appear diffusely demineralized which may limit detection of small or nondisplaced fractures. No visible skull base fracture is seen. No discernible fracture of the vertebral bodies or posterior elements. No worrisome lytic or blastic lesions. Multilevel cervical spondylitic changes as below. Advanced arthrosis at the atlantodental and basion dens interval including a likely degenerative os odontoideum. Soft tissues and spinal canal: No pre or paravertebral fluid or swelling. No visible canal hematoma. Some minimal enthesopathic mineralization in the nuchal ligament. Disc levels: Multilevel cervical spondylitic changes are present with diffuse intervertebral disc height loss and posterior ridging with disc osteophyte complexes. Larger complexes at C4-5 and C5-6 result in at most mild canal stenosis with only partial effacement of ventral thecal sac at the remaining cervical levels. Diffuse uncinate spurring and facet hypertrophic changes result in mild-to-moderate multilevel neural foraminal narrowing without severe foraminal impingement in the cervical spine. Upper chest: Some apical emphysematous changes and biapical  pleuroparenchymal scarring. Calcification of the proximal great vessels. Cervical carotid atherosclerosis is noted. Other: No concerning thyroid nodules or masses. Diminutive appearance of the thyroid. IMPRESSION: 1. Right frontal/supraorbital swelling and laceration extending to the temporal soft tissues as well. Thin crescentic scalp hematoma measuring up to 6 mm maximal thickness. No subjacent calvarial fracture or other acute osseous injury. 2. No acute intracranial abnormality. 3. Background of chronic microvascular angiopathy and parenchymal volume loss. Ventricular dilatation is slightly out of proportion to the degree of sulcal prominence with narrowing of the callososeptal angle and upward bowing of the corpus callosum. Findings could suggest a normal pressure hydrocephalus though are similar to comparison imaging. Correlate with clinical symptoms. 4. Mild right malar contusion and laceration. 5. No acute facial bone fracture. 6. Chronic appearing deformities of the nasal bones. 7. Postsurgical changes prior sinus surgery. Residual pneumatized secretions and thickening within the posterior ethmoids on the left with slightly hyperostotic changes likely reflecting an acute on chronic sinusitis. 8. No evidence of acute fracture or traumatic listhesis of the cervical spine. 9. Diffuse cervical spondylitic changes with multifactorial mild canal stenosis and mild-to-moderate multilevel foraminal narrowing as described above. 10. Cervical and intracranial atherosclerosis. 11.  Emphysema (ICD10-J43.9). Electronically Signed   By: Lovena Le M.D.   On: 12/24/2019 04:19   CT Cervical Spine Wo Contrast  Result Date: 01/12/2020 CLINICAL DATA:  Fall, right cheek skin tear and right supraorbital hematoma EXAM: CT HEAD WITHOUT CONTRAST CT MAXILLOFACIAL WITHOUT CONTRAST CT CERVICAL SPINE WITHOUT CONTRAST TECHNIQUE: Multidetector CT imaging of the head, cervical spine, and maxillofacial structures were performed  using the standard protocol without intravenous contrast. Multiplanar CT image reconstructions of the cervical spine and maxillofacial structures were also generated. COMPARISON:  CT head 12/17/2014, MRI 12/12/2009, paranasal sinus CT 11/04/2009 FINDINGS: CT HEAD FINDINGS Brain: No evidence of acute infarction, hemorrhage, hydrocephalus, extra-axial collection, visible mass lesion or mass effect. Diffuse prominence of the cisterns and sulci compatible with parenchymal volume loss. Ventricular dilatation is sat Lea out of proportion to the degree of sulcal prominence with slight narrowing of the callososeptal angle and upward bowing of the corpus callosum. Patchy areas of white matter hypoattenuation are most compatible with chronic microvascular angiopathy. Midline structures are normal. Cerebellar tonsils are normally position. Vascular: Atherosclerotic calcification of the carotid siphons and intradural vertebral arteries. No hyperdense vessel. Skull: Right frontal/supraorbital swelling and laceration extending to the temporal soft tissues as well. Thin crescentic scalp hematoma measuring up to 6 mm maximal  thickness. No subjacent calvarial fracture or other acute osseous injury of the calvaria. Other: None. CT MAXILLOFACIAL FINDINGS Osseous: No fracture of the bony orbits. Chronic appearing deformities of the nasal bones similar to comparison imaging in 2011. No other mid face fractures are seen. The pterygoid plates are intact. No visible or suspected temporal bone fractures. Temporomandibular joints are normally aligned. The mandible is intact. Numerous dental amalgams and metallic orthotic hardware limit evaluation of the dentition. No clearly fractured or avulsed teeth. Orbits: Some slightly asymmetric right supraorbital swelling and palpebral thickening. No retro septal gas, stranding or hemorrhage. Bilateral lens extractions. Small amount of right sub palpebral gas is a normal finding. Sinuses: Postsurgical  changes from prior antrectomies and partial ethmoidectomies. Some pneumatized secretions and thickening are present within the posterior ethmoids on the left with slightly hyperostotic changes likely reflecting an acute on chronic sinusitis. Remaining paranasal sinuses and mastoid air cells are predominantly clear. Middle ear cavities are clear. Ossicular chains are normally configured. Soft tissues: Right periorbital and supraorbital soft tissue swelling. Mild right malar contusion and laceration as well. Question some mild pre mental thickening. Soft tissues are otherwise unremarkable without further gas or foreign body. CT CERVICAL SPINE FINDINGS Alignment: Stabilization collar is absent at the time of examination slightly exaggerated cervical lordosis. Minimal anterolisthesis C2 on 3 and retrolisthesis C3 on 4 is favored to be on a degenerative basis with spondylitic changes at these levels. No evidence of traumatic listhesis. No abnormally widened, perched or jumped facets. Normal alignment of the craniocervical articulations accounting for mild rightward cranial rotation. Effacement of the atlantodental interval likely related to advanced arthrosis. Skull base and vertebrae: The osseous structures appear diffusely demineralized which may limit detection of small or nondisplaced fractures. No visible skull base fracture is seen. No discernible fracture of the vertebral bodies or posterior elements. No worrisome lytic or blastic lesions. Multilevel cervical spondylitic changes as below. Advanced arthrosis at the atlantodental and basion dens interval including a likely degenerative os odontoideum. Soft tissues and spinal canal: No pre or paravertebral fluid or swelling. No visible canal hematoma. Some minimal enthesopathic mineralization in the nuchal ligament. Disc levels: Multilevel cervical spondylitic changes are present with diffuse intervertebral disc height loss and posterior ridging with disc osteophyte  complexes. Larger complexes at C4-5 and C5-6 result in at most mild canal stenosis with only partial effacement of ventral thecal sac at the remaining cervical levels. Diffuse uncinate spurring and facet hypertrophic changes result in mild-to-moderate multilevel neural foraminal narrowing without severe foraminal impingement in the cervical spine. Upper chest: Some apical emphysematous changes and biapical pleuroparenchymal scarring. Calcification of the proximal great vessels. Cervical carotid atherosclerosis is noted. Other: No concerning thyroid nodules or masses. Diminutive appearance of the thyroid. IMPRESSION: 1. Right frontal/supraorbital swelling and laceration extending to the temporal soft tissues as well. Thin crescentic scalp hematoma measuring up to 6 mm maximal thickness. No subjacent calvarial fracture or other acute osseous injury. 2. No acute intracranial abnormality. 3. Background of chronic microvascular angiopathy and parenchymal volume loss. Ventricular dilatation is slightly out of proportion to the degree of sulcal prominence with narrowing of the callososeptal angle and upward bowing of the corpus callosum. Findings could suggest a normal pressure hydrocephalus though are similar to comparison imaging. Correlate with clinical symptoms. 4. Mild right malar contusion and laceration. 5. No acute facial bone fracture. 6. Chronic appearing deformities of the nasal bones. 7. Postsurgical changes prior sinus surgery. Residual pneumatized secretions and thickening within the posterior ethmoids on  the left with slightly hyperostotic changes likely reflecting an acute on chronic sinusitis. 8. No evidence of acute fracture or traumatic listhesis of the cervical spine. 9. Diffuse cervical spondylitic changes with multifactorial mild canal stenosis and mild-to-moderate multilevel foraminal narrowing as described above. 10. Cervical and intracranial atherosclerosis. 11.  Emphysema (ICD10-J43.9).  Electronically Signed   By: Lovena Le M.D.   On: 01/06/2020 04:19   Pelvis Portable  Result Date: 01/08/2020 CLINICAL DATA:  Status post right hip fracture repair EXAM: PORTABLE PELVIS 1-2 VIEWS COMPARISON:  None. FINDINGS: The right hip fracture is been repaired with a gamma nail and intramedullary rod with a distal interlocking screw. Postoperative changes noted. No other acute abnormalities. IMPRESSION: Right hip fracture repair as above. Electronically Signed   By: Dorise Bullion III M.D   On: 01/03/2020 11:22   DG Knee Right Port  Result Date: 01/03/2020 CLINICAL DATA:  Closed comminuted intertrochanteric femur fracture. EXAM: PORTABLE RIGHT KNEE - 1-2 VIEW COMPARISON:  Hip radiography from earlier today FINDINGS: Negative for knee subluxation or fracture. Osteopenia. Extensive arterial calcification. IMPRESSION: No acute finding. Electronically Signed   By: Monte Fantasia M.D.   On: 01/16/2020 09:09   DG C-Arm 1-60 Min-No Report  Result Date: 12/22/2019 Fluoroscopy was utilized by the requesting physician.  No radiographic interpretation.   DG HIP OPERATIVE UNILAT W OR W/O PELVIS RIGHT  Result Date: 12/27/2019 CLINICAL DATA:  Right hip fracture repair EXAM: OPERATIVE RIGHT HIP (WITH PELVIS IF PERFORMED)  VIEWS TECHNIQUE: Fluoroscopic spot image(s) were submitted for interpretation post-operatively. FLUOROSCOPY TIME:  40 seconds. Images: Two COMPARISON:  None. FINDINGS: A gamma nail and intramedullary femoral rod have been placed across the right hip fracture. Alignment is improved in the interval. No dislocation. A distal interlocking screw is noted. IMPRESSION: Right hip fracture repair as above. Electronically Signed   By: Dorise Bullion III M.D   On: 12/26/2019 11:22   DG Hip Unilat W or Wo Pelvis 2-3 Views Right  Result Date: 01/04/2020 CLINICAL DATA:  Initial evaluation for acute trauma, fall. EXAM: DG HIP (WITH OR WITHOUT PELVIS) 2-3V RIGHT COMPARISON:  None. FINDINGS:  Acute comminuted intratrochanteric fracture of the right hip with superior subluxation. Bony pelvis grossly intact. Limited views of the left hip demonstrate no acute finding. Underlying osteopenia. Prominent vascular calcifications about the proximal thighs. Degenerative change noted within the lower lumbar spine. IMPRESSION: Acute comminuted intertrochanteric fracture of the right hip. Electronically Signed   By: Jeannine Boga M.D.   On: 01/03/2020 02:10   CT Maxillofacial WO CM  Result Date: 12/25/2019 CLINICAL DATA:  Fall, right cheek skin tear and right supraorbital hematoma EXAM: CT HEAD WITHOUT CONTRAST CT MAXILLOFACIAL WITHOUT CONTRAST CT CERVICAL SPINE WITHOUT CONTRAST TECHNIQUE: Multidetector CT imaging of the head, cervical spine, and maxillofacial structures were performed using the standard protocol without intravenous contrast. Multiplanar CT image reconstructions of the cervical spine and maxillofacial structures were also generated. COMPARISON:  CT head 12/17/2014, MRI 12/12/2009, paranasal sinus CT 11/04/2009 FINDINGS: CT HEAD FINDINGS Brain: No evidence of acute infarction, hemorrhage, hydrocephalus, extra-axial collection, visible mass lesion or mass effect. Diffuse prominence of the cisterns and sulci compatible with parenchymal volume loss. Ventricular dilatation is sat Lea out of proportion to the degree of sulcal prominence with slight narrowing of the callososeptal angle and upward bowing of the corpus callosum. Patchy areas of white matter hypoattenuation are most compatible with chronic microvascular angiopathy. Midline structures are normal. Cerebellar tonsils are normally position. Vascular: Atherosclerotic calcification of  the carotid siphons and intradural vertebral arteries. No hyperdense vessel. Skull: Right frontal/supraorbital swelling and laceration extending to the temporal soft tissues as well. Thin crescentic scalp hematoma measuring up to 6 mm maximal thickness. No  subjacent calvarial fracture or other acute osseous injury of the calvaria. Other: None. CT MAXILLOFACIAL FINDINGS Osseous: No fracture of the bony orbits. Chronic appearing deformities of the nasal bones similar to comparison imaging in 2011. No other mid face fractures are seen. The pterygoid plates are intact. No visible or suspected temporal bone fractures. Temporomandibular joints are normally aligned. The mandible is intact. Numerous dental amalgams and metallic orthotic hardware limit evaluation of the dentition. No clearly fractured or avulsed teeth. Orbits: Some slightly asymmetric right supraorbital swelling and palpebral thickening. No retro septal gas, stranding or hemorrhage. Bilateral lens extractions. Small amount of right sub palpebral gas is a normal finding. Sinuses: Postsurgical changes from prior antrectomies and partial ethmoidectomies. Some pneumatized secretions and thickening are present within the posterior ethmoids on the left with slightly hyperostotic changes likely reflecting an acute on chronic sinusitis. Remaining paranasal sinuses and mastoid air cells are predominantly clear. Middle ear cavities are clear. Ossicular chains are normally configured. Soft tissues: Right periorbital and supraorbital soft tissue swelling. Mild right malar contusion and laceration as well. Question some mild pre mental thickening. Soft tissues are otherwise unremarkable without further gas or foreign body. CT CERVICAL SPINE FINDINGS Alignment: Stabilization collar is absent at the time of examination slightly exaggerated cervical lordosis. Minimal anterolisthesis C2 on 3 and retrolisthesis C3 on 4 is favored to be on a degenerative basis with spondylitic changes at these levels. No evidence of traumatic listhesis. No abnormally widened, perched or jumped facets. Normal alignment of the craniocervical articulations accounting for mild rightward cranial rotation. Effacement of the atlantodental interval  likely related to advanced arthrosis. Skull base and vertebrae: The osseous structures appear diffusely demineralized which may limit detection of small or nondisplaced fractures. No visible skull base fracture is seen. No discernible fracture of the vertebral bodies or posterior elements. No worrisome lytic or blastic lesions. Multilevel cervical spondylitic changes as below. Advanced arthrosis at the atlantodental and basion dens interval including a likely degenerative os odontoideum. Soft tissues and spinal canal: No pre or paravertebral fluid or swelling. No visible canal hematoma. Some minimal enthesopathic mineralization in the nuchal ligament. Disc levels: Multilevel cervical spondylitic changes are present with diffuse intervertebral disc height loss and posterior ridging with disc osteophyte complexes. Larger complexes at C4-5 and C5-6 result in at most mild canal stenosis with only partial effacement of ventral thecal sac at the remaining cervical levels. Diffuse uncinate spurring and facet hypertrophic changes result in mild-to-moderate multilevel neural foraminal narrowing without severe foraminal impingement in the cervical spine. Upper chest: Some apical emphysematous changes and biapical pleuroparenchymal scarring. Calcification of the proximal great vessels. Cervical carotid atherosclerosis is noted. Other: No concerning thyroid nodules or masses. Diminutive appearance of the thyroid. IMPRESSION: 1. Right frontal/supraorbital swelling and laceration extending to the temporal soft tissues as well. Thin crescentic scalp hematoma measuring up to 6 mm maximal thickness. No subjacent calvarial fracture or other acute osseous injury. 2. No acute intracranial abnormality. 3. Background of chronic microvascular angiopathy and parenchymal volume loss. Ventricular dilatation is slightly out of proportion to the degree of sulcal prominence with narrowing of the callososeptal angle and upward bowing of the  corpus callosum. Findings could suggest a normal pressure hydrocephalus though are similar to comparison imaging. Correlate with clinical symptoms. 4. Mild  right malar contusion and laceration. 5. No acute facial bone fracture. 6. Chronic appearing deformities of the nasal bones. 7. Postsurgical changes prior sinus surgery. Residual pneumatized secretions and thickening within the posterior ethmoids on the left with slightly hyperostotic changes likely reflecting an acute on chronic sinusitis. 8. No evidence of acute fracture or traumatic listhesis of the cervical spine. 9. Diffuse cervical spondylitic changes with multifactorial mild canal stenosis and mild-to-moderate multilevel foraminal narrowing as described above. 10. Cervical and intracranial atherosclerosis. 11.  Emphysema (ICD10-J43.9). Electronically Signed   By: Lovena Le M.D.   On: 01/03/2020 04:19    Lab Data:  CBC: Recent Labs  Lab 01/14/2020 0053 12/23/2019 0519 01/11/20 0500 01/11/20 2226 01/12/20 0440  WBC 16.4* 11.4* 14.5*  --  14.5*  NEUTROABS 13.9*  --   --   --   --   HGB 9.6* 8.8* 7.2* 8.1* 8.1*  HCT 30.5* 27.1* 22.5* 24.2* 23.8*  MCV 95.0 92.5 93.4  --  89.1  PLT 194 184 146*  --  99*   Basic Metabolic Panel: Recent Labs  Lab 12/21/2019 0053 01/16/2020 0519 01/11/20 0500 01/12/20 0440  NA 137 140 137 135  K 4.5 4.7 4.5 4.0  CL 108 110 107 106  CO2 21* 19* 20* 18*  GLUCOSE 162* 139* 157* 127*  BUN 20 28* 32* 28*  CREATININE 1.44* 1.59* 1.62* 1.26*  CALCIUM 8.9 8.9 8.3* 8.2*  MG  --  2.0  --   --   PHOS  --  3.6  --   --    GFR: Estimated Creatinine Clearance: 38.4 mL/min (A) (by C-G formula based on SCr of 1.26 mg/dL (H)). Liver Function Tests: Recent Labs  Lab 01/03/2020 0053 01/05/2020 0519  AST 20 22  ALT 13 <5  ALKPHOS 82 75  BILITOT 0.6 0.9  PROT 7.3 7.1  ALBUMIN 3.5 3.5   No results for input(s): LIPASE, AMYLASE in the last 168 hours. No results for input(s): AMMONIA in the last 168  hours. Coagulation Profile: Recent Labs  Lab 01/02/2020 0053 01/02/2020 0519 01/11/20 0500 01/12/20 0440  INR 2.7* 1.7* 1.5* 1.6*   Cardiac Enzymes: No results for input(s): CKTOTAL, CKMB, CKMBINDEX, TROPONINI in the last 168 hours. BNP (last 3 results) No results for input(s): PROBNP in the last 8760 hours. HbA1C: No results for input(s): HGBA1C in the last 72 hours. CBG: Recent Labs  Lab 01/11/20 2110  GLUCAP 178*   Lipid Profile: No results for input(s): CHOL, HDL, LDLCALC, TRIG, CHOLHDL, LDLDIRECT in the last 72 hours. Thyroid Function Tests: No results for input(s): TSH, T4TOTAL, FREET4, T3FREE, THYROIDAB in the last 72 hours. Anemia Panel: No results for input(s): VITAMINB12, FOLATE, FERRITIN, TIBC, IRON, RETICCTPCT in the last 72 hours. Urine analysis:    Component Value Date/Time   COLORURINE YELLOW 12/31/2019 0940   APPEARANCEUR CLEAR 01/17/2020 0940   LABSPEC 1.017 01/18/2020 0940   PHURINE 6.0 01/19/2020 0940   GLUCOSEU NEGATIVE 01/12/2020 0940   GLUCOSEU NEGATIVE 10/16/2017 1613   HGBUR MODERATE (A) 01/08/2020 0940   BILIRUBINUR NEGATIVE 01/13/2020 0940   KETONESUR NEGATIVE 12/27/2019 0940   PROTEINUR NEGATIVE 01/05/2020 0940   UROBILINOGEN 0.2 10/16/2017 1613   NITRITE NEGATIVE 12/22/2019 0940   LEUKOCYTESUR NEGATIVE 12/29/2019 0940     Corbin Falck M.D. [facility] 01/12/2020, 1:00 PM   Call night coverage person covering after 7pm

## 2020-01-13 ENCOUNTER — Inpatient Hospital Stay (HOSPITAL_COMMUNITY): Payer: Medicare Other

## 2020-01-13 DIAGNOSIS — D72829 Elevated white blood cell count, unspecified: Secondary | ICD-10-CM

## 2020-01-13 DIAGNOSIS — I2510 Atherosclerotic heart disease of native coronary artery without angina pectoris: Secondary | ICD-10-CM | POA: Diagnosis not present

## 2020-01-13 DIAGNOSIS — I4891 Unspecified atrial fibrillation: Secondary | ICD-10-CM | POA: Diagnosis not present

## 2020-01-13 DIAGNOSIS — S72001A Fracture of unspecified part of neck of right femur, initial encounter for closed fracture: Secondary | ICD-10-CM | POA: Diagnosis not present

## 2020-01-13 LAB — CBC
HCT: 22.4 % — ABNORMAL LOW (ref 39.0–52.0)
Hemoglobin: 7.4 g/dL — ABNORMAL LOW (ref 13.0–17.0)
MCH: 30.1 pg (ref 26.0–34.0)
MCHC: 33 g/dL (ref 30.0–36.0)
MCV: 91.1 fL (ref 80.0–100.0)
Platelets: 117 10*3/uL — ABNORMAL LOW (ref 150–400)
RBC: 2.46 MIL/uL — ABNORMAL LOW (ref 4.22–5.81)
RDW: 13.7 % (ref 11.5–15.5)
WBC: 12.6 10*3/uL — ABNORMAL HIGH (ref 4.0–10.5)
nRBC: 0 % (ref 0.0–0.2)

## 2020-01-13 LAB — URINALYSIS, ROUTINE W REFLEX MICROSCOPIC
Bilirubin Urine: NEGATIVE
Glucose, UA: NEGATIVE mg/dL
Hgb urine dipstick: NEGATIVE
Ketones, ur: 5 mg/dL — AB
Nitrite: NEGATIVE
Protein, ur: 30 mg/dL — AB
Specific Gravity, Urine: 1.019 (ref 1.005–1.030)
pH: 5 (ref 5.0–8.0)

## 2020-01-13 LAB — COMPREHENSIVE METABOLIC PANEL
ALT: 9 U/L (ref 0–44)
AST: 37 U/L (ref 15–41)
Albumin: 2.7 g/dL — ABNORMAL LOW (ref 3.5–5.0)
Alkaline Phosphatase: 64 U/L (ref 38–126)
Anion gap: 9 (ref 5–15)
BUN: 28 mg/dL — ABNORMAL HIGH (ref 8–23)
CO2: 20 mmol/L — ABNORMAL LOW (ref 22–32)
Calcium: 8 mg/dL — ABNORMAL LOW (ref 8.9–10.3)
Chloride: 105 mmol/L (ref 98–111)
Creatinine, Ser: 1.35 mg/dL — ABNORMAL HIGH (ref 0.61–1.24)
GFR, Estimated: 51 mL/min — ABNORMAL LOW (ref >60)
Glucose, Bld: 117 mg/dL — ABNORMAL HIGH (ref 70–99)
Potassium: 3.9 mmol/L (ref 3.5–5.1)
Sodium: 134 mmol/L — ABNORMAL LOW (ref 135–145)
Total Bilirubin: 1 mg/dL (ref 0.3–1.2)
Total Protein: 5.9 g/dL — ABNORMAL LOW (ref 6.5–8.1)

## 2020-01-13 LAB — PROTIME-INR
INR: 2.2 — ABNORMAL HIGH (ref 0.8–1.2)
Prothrombin Time: 23.5 seconds — ABNORMAL HIGH (ref 11.4–15.2)

## 2020-01-13 LAB — STREP PNEUMONIAE URINARY ANTIGEN: Strep Pneumo Urinary Antigen: NEGATIVE

## 2020-01-13 MED ORDER — WARFARIN SODIUM 1 MG PO TABS
1.0000 mg | ORAL_TABLET | Freq: Once | ORAL | Status: DC
  Filled 2020-01-13: qty 1

## 2020-01-13 MED ORDER — ACETAMINOPHEN 10 MG/ML IV SOLN
1000.0000 mg | Freq: Four times a day (QID) | INTRAVENOUS | Status: AC
  Administered 2020-01-13 – 2020-01-14 (×4): 1000 mg via INTRAVENOUS
  Filled 2020-01-13 (×4): qty 100

## 2020-01-13 NOTE — Consult Note (Signed)
Palliative Care  Consult Note  Robert Richard is an 86 yo with recent functional status decline with falls at home and symptoms of vascular dementia who was admitted with a hip fracture and now has post-operative complications including fever, delirium, afib and aspiration PNA. I met with the patient, Robert Richard of over 48 years and Robert son. I explained the uncrtainty of Robert current condition and stressed the importance of hoping for the best but making plans in case things do not improve for him or Robert condition continues to deteriorate.  Summary of recommendations:  1. DNR, but full scope medical treatment and evaluation as indicated with goal to restore best QOL possible.  2. IV tylenol for pain control  3. Provided education and support re: acute delirium and cognitive changes in hospital setting.  4. Swallow evaluation pending-ok for him to have supervised sips and chips until cleared.  Will follow for Robert progression and additional palliative care needs.  Lane Hacker, DO Palliative Medicine  Time: 50 min  Greater than 50%  of this time was spent counseling and coordinating care related to the above assessment and plan.

## 2020-01-13 NOTE — Progress Notes (Addendum)
[place] for warfarin and lovenox Indication: atrial fibrillation and AVR  Allergies  Allergen Reactions  . Iodinated Diagnostic Agents Hives    Other reaction(s): RASH Other reaction(s): RASH  . Ioxaglate Hives    Other reaction(s): RASH    Patient Measurements: Height: 6\' 2"  (188 cm) Weight: 65.8 kg (145 lb) IBW/kg (Calculated) : 82.2 Heparin Dosing Weight:   Vital Signs: Temp: 97.9 F (36.6 C) (11/24 0636) Temp Source: Oral (11/24 0636) BP: 105/51 (11/24 0636) Pulse Rate: 74 (11/24 0636)  Labs: Recent Labs    01/11/20 0500 01/11/20 0500 01/11/20 2226 01/11/20 2226 01/12/20 0440 01/13/20 0515  HGB 7.2*   < > 8.1*   < > 8.1* 7.4*  HCT 22.5*   < > 24.2*  --  23.8* 22.4*  PLT 146*  --   --   --  99* 117*  LABPROT 17.8*  --   --   --  18.2* 23.5*  INR 1.5*  --   --   --  1.6* 2.2*  CREATININE 1.62*  --   --   --  1.26*  --    < > = values in this interval not displayed.    Estimated Creatinine Clearance: 38.4 mL/min (A) (by C-G formula based on SCr of 1.26 mg/dL (H)).   Medications:  - PTA warfarin regimen: 2mg  daily  Assessment: Patient is an 86 y.o M with hx afib and AVR on warfarin PTA, presented to the ED on 11/19 s/p fall with right hip fracture.  He underwent right femur intramedullary fixation on 11/21. Warfarin and lovenox 0.5mg /kg q12h started post-op on 11/21.  - 11/20: vit K 10mg  PO x1 - 11/22: 2 units PRBC  Today, 01/13/2020: - INR is therapeutic, but increased from 1.6 to 2.2 today (with dose increased to 3mg  for the past two days) - hgb and plts low (likely secondary to ABLA) - drug-drug intxns: being on abx (ceftriaxone started on 11/23) can make patient more sensitive to warfarin - in afib  Goal of Therapy:  INR 2-3 Monitor platelets by anticoagulation protocol: Yes   Plan:  - warfarin 1 mg PO x1 today - Will d/c lovenox since INR >2 - monitor for s/sx bleeding  Robert Richard P 01/13/2020,9:47  AM ______________________________________ Adden: Patient may have aspirated. Per Dr. Florene Glen, hold warfarin for now while he is NPO and may resume lovenox back when INR <2  Dia Sitter, PharmD, BCPS 01/13/2020 12:37 PM

## 2020-01-13 NOTE — Progress Notes (Addendum)
Received referral from chaplain [facility].    Provided support with pt's spouse and son at bedside.  They report hopefulness that pt will improve and be able to come home.  Son feels pt's mentation is better today than previously.     Spoke with family about pt history and values.  They note he is determined and active - "going all in on whatever he sets his mind to."    He has a fascination with [place] and has put up hundreds of gourds on their property where a flock of [place] has returned each spring.  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  They also note his eccentric interests - such as having a pet monkey for 20 years.   Describing his previous level of activity, his family notes some shock around the drastic shift in his current state.    They express that they are [place] and ask for prayers.  Jonie was able to respond minimally verbally when family was speaking about prayer.  Chaplain shared prayers at bedside.    Family will visit tomorrow for Thanksgiving.  This chaplain will follow up for support in hospital.

## 2020-01-13 NOTE — Progress Notes (Addendum)
Progress Note  Patient Name: Robert Richard Date of Encounter: 01/13/2020  [facility] Cardiologist: Dorris Carnes, MD   Subjective   Patient was febrile to 101F overnight. Unable to do PT yesterday. Afib rates overall well controlled, has some intermittent elevation. BP soft this morning.   Inpatient Medications    Scheduled Meds: . sodium chloride   Intravenous Once  . carbidopa-levodopa  1 tablet Oral TID  . diltiazem  240 mg Oral Daily  . docusate sodium  100 mg Oral BID  . enoxaparin (LOVENOX) injection  30 mg Subcutaneous Q12H  . feeding supplement  237 mL Oral BID BM  . gabapentin  100 mg Oral QHS  . ketotifen  1 drop Both Eyes Daily  . multivitamin with minerals   Oral Daily  . pantoprazole  40 mg Oral Daily  . rosuvastatin  2.5 mg Oral QODAY  . topiramate  75 mg Oral Daily  . Warfarin - Pharmacist Dosing Inpatient   Does not apply q1600   Continuous Infusions: . cefTRIAXone (ROCEPHIN)  IV Stopped (01/13/20 0548)  . chlorproMAZINE (THORAZINE) IV Stopped (01/13/20 0548)  . lactated ringers 100 mL/hr at 01/13/20 0514   PRN Meds: acetaminophen, chlorproMAZINE (THORAZINE) IV, menthol-cetylpyridinium **OR** phenol, metoCLOPramide **OR** metoCLOPramide (REGLAN) injection, morphine injection, ondansetron **OR** ondansetron (ZOFRAN) IV   Vital Signs    Vitals:   01/12/20 1823 01/12/20 1827 01/12/20 2112 01/13/20 0636  BP:  111/60 (!) 107/44 (!) 105/51  Pulse:  87 78 74  Resp:  20 18 18   Temp: (!) 101.2 F (38.4 C)  97.6 F (36.4 C) 97.9 F (36.6 C)  TempSrc: Axillary  Oral Oral  SpO2:  96% 96% 95%  Weight:      Height:        Intake/Output Summary (Last 24 hours) at 01/13/2020 0811 Last data filed at 01/13/2020 9518 Gross per 24 hour  Intake 1213.33 ml  Output 550 ml  Net 663.33 ml   Last 3 Weights 01/19/2020 01/18/2020 10/23/2019  Weight (lbs) 145 lb 134 lb 14.7 oz 135 lb  Weight (kg) 65.772 kg 61.2 kg 61.236 kg      Telemetry    Afib HR 80-90  with intermittent elevation up to 140s - Personally Reviewed  ECG    No new - Personally Reviewed  Physical Exam   GEN: No acute distress.   Neck: No JVD Cardiac: Irreg Irreg, no murmurs, rubs, or gallops.  Respiratory: Clear to auscultation bilaterally. GI: Soft, nontender, non-distended  MS: No edema; No deformity. Neuro:  Nonfocal  Psych: Normal affect   Labs    High Sensitivity Troponin:  No results for input(s): TROPONINIHS in the last 720 hours.    Chemistry Recent Labs  Lab 12/23/2019 0053 01/06/2020 0053 12/31/2019 0519 01/11/20 0500 01/12/20 0440  NA 137   < > 140 137 135  K 4.5   < > 4.7 4.5 4.0  CL 108   < > 110 107 106  CO2 21*   < > 19* 20* 18*  GLUCOSE 162*   < > 139* 157* 127*  BUN 20   < > 28* 32* 28*  CREATININE 1.44*   < > 1.59* 1.62* 1.26*  CALCIUM 8.9   < > 8.9 8.3* 8.2*  PROT 7.3  --  7.1  --   --   ALBUMIN 3.5  --  3.5  --   --   AST 20  --  22  --   --   ALT  13  --  <5  --   --   ALKPHOS 82  --  75  --   --   BILITOT 0.6  --  0.9  --   --   GFRNONAA 47*   < > 42* 41* 55*  ANIONGAP 8   < > 11 10 11    < > = values in this interval not displayed.     Hematology Recent Labs  Lab 01/11/20 0500 01/11/20 0500 01/11/20 2226 01/12/20 0440 01/13/20 0515  WBC 14.5*  --   --  14.5* 12.6*  RBC 2.41*  --   --  2.67* 2.46*  HGB 7.2*   < > 8.1* 8.1* 7.4*  HCT 22.5*   < > 24.2* 23.8* 22.4*  MCV 93.4  --   --  89.1 91.1  MCH 29.9  --   --  30.3 30.1  MCHC 32.0  --   --  34.0 33.0  RDW 13.5  --   --  13.8 13.7  PLT 146*  --   --  99* 117*   < > = values in this interval not displayed.    BNPNo results for input(s): BNP, PROBNP in the last 168 hours.   DDimer No results for input(s): DDIMER in the last 168 hours.   Radiology    DG CHEST PORT 1 VIEW  Result Date: 01/12/2020 CLINICAL DATA:  Status post right IM nail fever EXAM: PORTABLE CHEST 1 VIEW COMPARISON:  01/18/2020, 10/15/2017 FINDINGS: Worsening airspace disease in the left lower lung.  Mild cardiomegaly with aortic atherosclerosis. No pneumothorax. Post sternotomy changes with valve prosthesis. IMPRESSION: Worsening airspace disease in the left lower lung, suspect for pneumonia or potentially aspiration. Cardiomegaly. Electronically Signed   By: Donavan Foil M.D.   On: 01/12/2020 20:00    Cardiac Studies    Echo 10/2018 1. The left ventricle has normal systolic function with an ejection  fraction of 60-65%. The cavity size was normal. There is mildly increased  left ventricular wall thickness. Left ventricular diastolic function could  not be evaluated.  2. Left atrial size was severely dilated.  3. Right atrial size was moderately dilated.  4. The mitral valve is abnormal. There is severe mitral annular  calcification present. Mitral valve regurgitation is moderate by color  flow Doppler. Mild mitral valve stenosis.  5. The pulmonic valve was grossly normal. Pulmonic valve regurgitation is  not visualized by color flow Doppler.  6. The aorta is abnormal unless otherwise noted.  7. There is mild dilatation of the ascending aorta measuring 39 mm.  Holter monitor 05/2015 Atrial fibrillation, 66 to 143 bpm  Average HR 86 bpm  Rare PVC  Patient Profile     86 y.o. 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  Assessment & Plan    Permanent Afib, now in RVR - PTA cardizem 240mg  daily. diltiazem heldyesterday for surgery -Post-op the patient went intoAfib RVR with rates in the 130s, given IV cardizem 10 mg with improvement to 106 bpm. Hgb also noted to be 7.2 s/p 2 units PRBCs. Hgb 7.4 today - Echo 10/2018 showed preserved EF - Diltiazem 240mg  restarted. Overall afib rates  are controlled.  - coumadin per pharmacy  Acute right hip fracture - s/p repair 11/21 by orthopedics - Hgb 7.2 s/p 2 units PRBS ordered. Hgb today 7.4 - per ortho   Leukocytosis - suspected reactive however patient was febrile last night - WBC 12.6 - Lactic acid 2.1 and on IVF - Blood cultures pending - further work-up per IM  DM2 - Hgb A1C 6.5 06/2019 - diet controlled  CKD stage 3 - baseline 1.5 - creatinine stable  HLD - continue crestor - LDL 63  CAD s/p CABG in 2015 - denies no chest pain - continue statin  HTN - cardizem restarted as above - Bps stable  S/p AVR - last echo in 2020 showed mead gradient 86mmHg - likely due for repeat echo however can be done as OP  For questions or updates, please contact [facility] Please consult [email or web address] for contact info under    Signed, Cadence Ninfa Meeker, PA-C  01/13/2020, 8:11 AM    The patient was seen, examined and discussed with Cadence Ninfa Meeker, PA-C   and I agree with the above.   The patient develop fever, elevated lactic acid and leukocytosis overnight, he is also anemic with hemoglobin 7.4 from yesterday.  He was started on IV antibiotics.  Blood cultures are pending. Telemetry shows atrial fibrillation with controlled ventricular rates in 70s, now only on oral Cardizem CD 240 daily we will continue the same regimen, the patient is encouraged to hydrate well.  Continue Coumadin for anticoagulation, INR today 2.2.  We will sign off, please call us with any questions.  Ena Dawley, MD 01/13/2020

## 2020-01-13 NOTE — Progress Notes (Signed)
PROGRESS NOTE    EAIN MULLENDORE  XIP:[id number] DOB: [birth year] DOA: 12/21/2019 PCP: [name], MD   Chief Complaint  Patient presents with  . Fall  . Leg Injury   Brief Narrative:  Patient is 86yo patient with CAD, hyperlipidemia, Chivonne Rascon. fib on warfarin, GERD, CKD stage III, Parkinson's disease presented to ED after sustaining Viraat Vanpatten fall at home.  History per his wife, patient had gotten out of the shower and while he was in the bathroom, he fell and landed on the right side of his face, right hip.  Patient sustained facial laceration with scalp contusion.  He also complained of right hip pain with difficulty in being able to bear weight on the right leg due to pain.  EMS was called. In ED BP was soft-2/52, and leukocytosis with normocytic anemia.  Creatinine 1.4.  CT imaging showed no acute intracranial abnormality but showed right frontal/supraorbital swelling and laceration extending to the temporal soft tissues, mild right liver contusion and laceration.  No facial bone fracture. Hip x-ray showed right hip fracture Patient was transferred to [facility] for further work-up  Assessment & Plan:   Principal Problem:   Closed right hip fracture, initial encounter [facility]) Active Problems:   Hyperlipidemia   CKD (chronic kidney disease)   CAD (coronary artery disease)   Atrial fibrillation with RVR ([place])   Diabetes mellitus without complication (HCC)   GERD (gastroesophageal reflux disease)   Parkinson's disease ([place])   Facial laceration   Leukocytosis   Hyperglycemia   Fall at home, initial encounter  Acute Metabolic Encephalopathy: suspect 2/2 acute hospitalization, pain, recent surgery, aspiration pneumonia Repeat head CT 11/24 without acute intracranial abnormality Delirium precautions Treat aspiration pneumonia, follow urine cx  Aspiration Pneumonia CXR 1123 with worsening airspace disease in LLL, concerning for pneumonia vs aspiration Continue antibiotics Follow blood and  urine cx SLP eval with mild/moderate aspiration risk -> dysphagia 1 diet, nectar thick liquid  Acute comminuted closed right hip fracture, after mechanical fall at home [facility]) -Right hip x-ray showed acute comminuted intertrochanteric fracture of the right hip with superior subluxation -Underwent intramedullary fixation of the right femur (Dr [name]), on 67/34 -Complicated by anemia and Zoraya Fiorenza. fib with RVR on 11/22, doing well today, will start PT - Continue warfarin for DVT ppx - therapy recommending SNF  Atrial fibrillation with RVR -Postop in atrial fibrillation with RVR on 11/22 requiring digoxin, IV Cardizem. Resumed oral Cardizem 240 mg daily -Coumadin per pharmacy, cardiology following  Acute on chronic blood loss anemia, postop -Hemoglobin 7.2 on 11/22, status post 2 units packed RBC transfusion -Hb 7.4 today, mild downtrending, continue to monitor   Right frontal/supraorbital swelling and laceration -Continue wound care, no active bleeding  Leukocytosis  Treated for aspiration pneumonia above  Diabetes mellitus type 2, diet controlled -Hemoglobin A1c 6.5 08/09/2019 -Continue carb modified diet  CKD stageIIIa -Baseline creatinine ~1.5 -Avoid nephrotoxic medications, hypotension -continue to monitor  CAD, hyperlipidemia Continue Crestor  GERD -Continue Protonix   Parkinson's disease Continue Sinemet  Underweight, protein calorie malnutrition Estimated body mass index is 18.62 kg/m as calculated from the following:   Height as of this encounter: 6\' 2"  (1.88 m).   Weight as of this encounter: 65.8 kg.  Nutritional supplements  DVT prophylaxis: warfarin Code Status: DNR Family Communication: son, wife at bedside Disposition:   Status is: Inpatient  Remains inpatient appropriate because:Inpatient level of care appropriate due to severity of illness   Dispo: The patient is from: Home  Anticipated d/c is to: SNF               Anticipated d/c date is: > 3 days              Patient currently is not medically stable to d/c.       Consultants:   Palliative care  Cardiology  ortho  Procedures:  intramedullary fixation of the right femur (Dr [name]), on 11/21  Antimicrobials:  Anti-infectives (From admission, onward)   Start     Dose/Rate Route Frequency Ordered Stop   01/12/20 2100  vancomycin (VANCOCIN) IVPB 1000 mg/200 mL premix        1,000 mg 200 mL/hr over 60 Minutes Intravenous  Once 01/12/20 2001 01/13/20 0548   01/12/20 2030  cefTRIAXone (ROCEPHIN) 1 g in sodium chloride 0.9 % 100 mL IVPB        1 g 200 mL/hr over 30 Minutes Intravenous Every 24 hours 01/12/20 1930     01/09/2020 1500  ceFAZolin (ANCEF) IVPB 2g/100 mL premix        2 g 200 mL/hr over 30 Minutes Intravenous Every 6 hours 12/26/2019 1113 01/16/2020 2141     Subjective: Lethargic, improved on repeat eval Able to answer simple questions  Objective: Vitals:   01/13/20 1004 01/13/20 1110 01/13/20 1842 01/13/20 1957  BP: (!) 124/54 (!) 117/49 (!) 114/49 (!) 115/57  Pulse: 94 87 70 85  Resp: 18 18 20 18   Temp: 97.9 F (36.6 C) 98 F (36.7 C) 98.6 F (37 C) 97.9 F (36.6 C)  TempSrc: Oral Oral Oral   SpO2: 95% 97% 94% 96%  Weight:      Height:        Intake/Output Summary (Last 24 hours) at 01/13/2020 2107 Last data filed at 01/13/2020 1651 Gross per 24 hour  Intake 2083.33 ml  Output 1100 ml  Net 983.33 ml   Filed Weights   01/11/2020 2309 01/18/2020 2312  Weight: 61.2 kg 65.8 kg    Examination:  General exam: Appears calm and comfortable  Respiratory system: Clear to auscultation. Respiratory effort normal. Cardiovascular system: S1 & S2 heard, RRR. Gastrointestinal system: Abdomen is nondistended, soft and nontender Central nervous system: lethargic, awakens and answers simple questions, follows commands. Moves all extremities, wiggles toe with R foot, but doesn't move leg siginificantly, suspect 2/2 pain/recent  surgery (discussed with therapy and similar yesterday) Extremities: no LEE Skin: No rashes, lesions or ulcers Psychiatry: Judgement and insight appear normal. Mood & affect appropriate.     Data Reviewed: I have personally reviewed following labs and imaging studies  CBC: Recent Labs  Lab 01/06/2020 0053 01/19/2020 0053 12/29/2019 0519 01/11/20 0500 01/11/20 2226 01/12/20 0440 01/13/20 0515  WBC 16.4*  --  11.4* 14.5*  --  14.5* 12.6*  NEUTROABS 13.9*  --   --   --   --   --   --   HGB 9.6*   < > 8.8* 7.2* 8.1* 8.1* 7.4*  HCT 30.5*   < > 27.1* 22.5* 24.2* 23.8* 22.4*  MCV 95.0  --  92.5 93.4  --  89.1 91.1  PLT 194  --  184 146*  --  99* 117*   < > = values in this interval not displayed.    Basic Metabolic Panel: Recent Labs  Lab 01/07/2020 0053 01/15/2020 0519 01/11/20 0500 01/12/20 0440 01/13/20 1237  NA 137 140 137 135 134*  K 4.5 4.7 4.5 4.0 3.9  CL 108 110 107 106 105  CO2 21* 19* 20* 18* 20*  GLUCOSE 162* 139* 157* 127* 117*  BUN 20 28* 32* 28* 28*  CREATININE 1.44* 1.59* 1.62* 1.26* 1.35*  CALCIUM 8.9 8.9 8.3* 8.2* 8.0*  MG  --  2.0  --   --   --   PHOS  --  3.6  --   --   --     GFR: Estimated Creatinine Clearance: 35.9 mL/min (Ambers Iyengar) (by C-G formula based on SCr of 1.35 mg/dL (H)).  Liver Function Tests: Recent Labs  Lab 01/14/2020 0053 01/09/2020 0519 01/13/20 1237  AST 20 22 37  ALT 13 <5 9  ALKPHOS 82 75 64  BILITOT 0.6 0.9 1.0  PROT 7.3 7.1 5.9*  ALBUMIN 3.5 3.5 2.7*    CBG: Recent Labs  Lab 01/11/20 2110  GLUCAP 178*     Recent Results (from the past 240 hour(s))  Resp Panel by RT-PCR (Flu Aviannah Castoro&B, Covid) Nasopharyngeal Swab     Status: None   Collection Time: 01/10/2020 11:50 PM   Specimen: Nasopharyngeal Swab; Nasopharyngeal(NP) swabs in vial transport medium  Result Value Ref Range Status   SARS Coronavirus 2 by RT PCR NEGATIVE NEGATIVE Final    Comment: (NOTE) SARS-CoV-2 target nucleic acids are NOT DETECTED.  The SARS-CoV-2 RNA is  generally detectable in upper respiratory specimens during the acute phase of infection. The lowest concentration of SARS-CoV-2 viral copies this assay can detect is 138 copies/mL. Ketty Bitton negative result does not preclude SARS-Cov-2 infection and should not be used as the sole basis for treatment or other patient management decisions. Pariss Hommes negative result may occur with  improper specimen collection/handling, submission of specimen other than nasopharyngeal swab, presence of viral mutation(s) within the areas targeted by this assay, and inadequate number of viral copies(<138 copies/mL). Kervens Roper negative result must be combined with clinical observations, patient history, and epidemiological information. The expected result is Negative.  Fact Sheet for Patients:  EntrepreneurPulse.com.au  Fact Sheet for Healthcare Providers:  IncredibleEmployment.be  This test is no t yet approved or cleared by the [place] FDA and  has been authorized for detection and/or diagnosis of SARS-CoV-2 by FDA under an Emergency Use Authorization (EUA). This EUA will remain  in effect (meaning this test can be used) for the duration of the COVID-19 declaration under Section 564(b)(1) of the Act, 21 U.S.C.section 360bbb-3(b)(1), unless the authorization is terminated  or revoked sooner.       Influenza Beyonca Wisz by PCR NEGATIVE NEGATIVE Final   Influenza B by PCR NEGATIVE NEGATIVE Final    Comment: (NOTE) The Xpert Xpress SARS-CoV-2/FLU/RSV plus assay is intended as an aid in the diagnosis of influenza from Nasopharyngeal swab specimens and should not be used as Myangel Summons sole basis for treatment. Nasal washings and aspirates are unacceptable for Xpert Xpress SARS-CoV-2/FLU/RSV testing.  Fact Sheet for Patients: EntrepreneurPulse.com.au  Fact Sheet for Healthcare Providers: IncredibleEmployment.be  This test is not yet approved or cleared by the [place] FDA and has been authorized for detection and/or diagnosis of SARS-CoV-2 by FDA under an Emergency Use Authorization (EUA). This EUA will remain in effect (meaning this test can be used) for the duration of the COVID-19 declaration under Section 564(b)(1) of the Act, 21 U.S.C. section 360bbb-3(b)(1), unless the authorization is terminated or revoked.  Performed at [facility], [address] [place], [place] [address]   Surgical pcr screen     Status: None   Collection Time: 01/12/2020  9:02 PM   Specimen: Nasal Mucosa; Nasal  Swab  Result Value Ref Range Status   MRSA, PCR NEGATIVE NEGATIVE Final   Staphylococcus aureus NEGATIVE NEGATIVE Final    Comment: (NOTE) The Xpert SA Assay (FDA approved for NASAL specimens in patients 25 years of age and older), is one component of Zoriana Oats comprehensive surveillance program. It is not intended to diagnose infection nor to guide or monitor treatment. Performed at [facility], [place] [address] [place], [place] [address]   Culture, blood (routine x 2)     Status: None (Preliminary result)   Collection Time: 01/12/20  7:21 PM   Specimen: BLOOD  Result Value Ref Range Status   Specimen Description   Final    BLOOD RIGHT ANTECUBITAL Performed at [facility] [address] [place], [place] [address]    Special Requests   Final    BOTTLES DRAWN AEROBIC AND ANAEROBIC Blood Culture adequate volume Performed at [facility] [address] [place], [place] [address]    Culture   Final    NO GROWTH < 12 HOURS Performed at [facility] [address] [place] [address], [place], [place] [address]    Report Status PENDING  Incomplete  Culture, blood (routine x 2)     Status: None (Preliminary result)   Collection Time: 01/12/20  7:22 PM   Specimen: BLOOD  Result Value Ref Range Status   Specimen Description   Final    BLOOD LEFT ARM Performed at [facility] [address], [place], [place] [address]    Special Requests   Final    BOTTLES DRAWN AEROBIC ONLY Blood Culture adequate volume Performed at [facility] [address], [place], [place] [address]    Culture   Final    NO GROWTH < 12 HOURS Performed at [facility] [address], [place],  [address]    Report Status PENDING  Incomplete         Radiology Studies: CT HEAD WO CONTRAST  Result Date: 01/13/2020 CLINICAL DATA:  Altered mental status. EXAM: CT HEAD WITHOUT CONTRAST TECHNIQUE: Contiguous axial images were obtained from the base of the skull through the vertex without intravenous contrast. COMPARISON:  January 09, 2020 FINDINGS: Brain: There is mild to moderate severity cerebral atrophy with widening of the extra-axial spaces and ventricular dilatation. There are areas of decreased attenuation within the white matter tracts of the supratentorial brain, consistent with microvascular disease changes. Vascular: No hyperdense vessel or unexpected calcification. Skull: Normal. Negative for fracture or focal lesion. Sinuses/Orbits: No acute finding. Other: Mild right frontal and right frontoparietal scalp soft tissue swelling is seen. IMPRESSION: 1. Mild right frontal and right frontoparietal scalp soft tissue swelling without evidence of an acute fracture or acute intracranial abnormality. 2. Cerebral atrophy and microvascular disease changes of the supratentorial brain. Electronically Signed   By: Virgina Norfolk M.D.   On: 01/13/2020 19:55   DG CHEST PORT 1 VIEW  Result Date: 01/12/2020 CLINICAL DATA:  Status post right IM nail fever EXAM: PORTABLE CHEST 1 VIEW COMPARISON:  01/17/2020, 10/15/2017 FINDINGS: Worsening airspace disease in the left lower lung. Mild cardiomegaly with aortic atherosclerosis. No pneumothorax. Post sternotomy changes with valve prosthesis. IMPRESSION: Worsening airspace disease in the left lower lung, suspect for pneumonia or  potentially aspiration. Cardiomegaly. Electronically Signed   By: Donavan Foil M.D.   On: 01/12/2020 20:00        Scheduled Meds: . sodium chloride   Intravenous Once  .  carbidopa-levodopa  1 tablet Oral TID  . diltiazem  240 mg Oral Daily  . docusate sodium  100 mg Oral BID  . feeding supplement  237 mL Oral BID BM  . gabapentin  100 mg Oral QHS  . ketotifen  1 drop Both Eyes Daily  . multivitamin with minerals   Oral Daily  . pantoprazole  40 mg Oral Daily  . rosuvastatin  2.5 mg Oral QODAY  . topiramate  75 mg Oral Daily  . Warfarin - Pharmacist Dosing Inpatient   Does not apply q1600   Continuous Infusions: . acetaminophen 1,000 mg (01/13/20 1643)  . cefTRIAXone (ROCEPHIN)  IV 1 g (01/13/20 2053)  . chlorproMAZINE (THORAZINE) IV 12.5 mg (01/13/20 1506)  . lactated ringers 100 mL/hr at 01/13/20 0514     LOS: 4 days    Time spent: over 30 min    Fayrene Helper, MD [facility]   To contact the attending provider between 7A-7P or the covering provider during after hours 7P-7A, please log into the web site [email or web address] and access using universal [place] password for that web site. If you do not have the password, please call the hospital operator.  01/13/2020, 9:07 PM

## 2020-01-13 NOTE — Evaluation (Addendum)
Clinical/Bedside Swallow Evaluation Patient Details  Name: Robert Richard MRN: [id number] Date of Birth: [birth year]  Today's Date: 01/13/2020 Time: SLP Start Time (ACUTE ONLY): 9767 SLP Stop Time (ACUTE ONLY): 1621 SLP Time Calculation (min) (ACUTE ONLY): 24 min  Past Medical History:  Past Medical History:  Diagnosis Date  . Allergic rhinitis   . Anemia   . Arthritis    SHOULDER  . Asthma with bronchitis   . Colon polyp   . COPD (chronic obstructive pulmonary disease) (HCC)    Astmatic bronchitis  . Dysrhythmia 08/2012   NEW ONSET ATRIAL FIBRILATION  . Gout    PMH  . Headache(784.0)   . HLD (hyperlipidemia)   . Nephrolithiasis   . Osteoporosis   . RLS (restless legs syndrome)   . Skin cancer (melanoma) ([place]) 2012   Right neck  . Skin cancer, basal cell    Dr Nevada Crane, [facility]   Past Surgical History:  Past Surgical History:  Procedure Laterality Date  . AORTIC VALVE REPLACEMENT N/A 02/20/2013   Procedure: AORTIC VALVE REPLACEMENT (AVR);  Surgeon: Ivin Poot, MD;  Location: [facility];  Service: Open Heart Surgery;  Laterality: N/A;  . COLONOSCOPY W/ POLYPECTOMY     Dr Deatra Ina  . CORONARY ARTERY BYPASS GRAFT N/A 02/20/2013   Procedure: CORONARY ARTERY BYPASS GRAFTING (CABG);  Surgeon: Ivin Poot, MD;  Location: [facility];  Service: Open Heart Surgery;  Laterality: N/A;  . FINGER SURGERY     for foreign body  . INGUINAL HERNIA REPAIR    . INTRAMEDULLARY (IM) NAIL INTERTROCHANTERIC Right 01/19/2020   Procedure: INTRAMEDULLARY (IM) NAIL INTERTROCHANTRIC;  Surgeon: Rod Can, MD;  Location: [facility];  Service: Orthopedics;  Laterality: Right;  . INTRAOPERATIVE TRANSESOPHAGEAL ECHOCARDIOGRAM N/A 02/20/2013   Procedure: INTRAOPERATIVE TRANSESOPHAGEAL ECHOCARDIOGRAM;  Surgeon: Ivin Poot, MD;  Location: [facility];  Service: Open Heart Surgery;  Laterality: N/A;  . LEFT AND RIGHT HEART CATHETERIZATION WITH CORONARY ANGIOGRAM N/A 01/05/2013   Procedure: LEFT AND RIGHT HEART  CATHETERIZATION WITH CORONARY ANGIOGRAM;  Surgeon: Blane Ohara, MD;  Location: [facility] CATH LAB;  Service: Cardiovascular;  Laterality: N/A;  . LEG SURGERY  06/2010    Dr.Lawson, for blood clott. Left leg   . NECK SURGERY  12/2010   Melanoma ; [facility]   . NOSE SURGERY     Septal Deviation  . RIGHT HEART CATHETERIZATION N/A 01/23/2013   Procedure: RIGHT HEART CATH;  Surgeon: Jolaine Artist, MD;  Location: [facility] CATH LAB;  Service: Cardiovascular;  Laterality: N/A;  . TEE WITHOUT CARDIOVERSION N/A 01/23/2013   Procedure: TRANSESOPHAGEAL ECHOCARDIOGRAM (TEE);  Surgeon: Jolaine Artist, MD;  Location: [facility] ENDOSCOPY;  Service: Cardiovascular;  Laterality: N/A;  sign heart cath consent w/tee consent   HPI:  Pt is an 86 y.o. male with medical history significant for hyperlipidemia, CAD, A. fib on warfarin, GERD, CKD stage III, Parkinson's disease who presented to the ED via EMS after sustaining a fall at home. CXR 11/23: Worsening airspace disease in the left lower lung, suspect for pneumonia or potentially aspiration. Cardiomegaly. Pt was most recently seen by SLP on 03/17/13 and a dysphagia 2 diet with thin liquids was recommended at that time per pt's request d/t thrush. MBS 02/26/13: Pt. exhibited mild oral dysphagia with delayed transit exacerbated by xerostomia and decreased endurance.  Mild-mod sensory pharyngeal dysphagia with reduced tongue base retraction and laryngeal elevation resulting in trace vallecular/pyriform sinus/pharyngeal wall residue.     Assessment / Plan / Recommendation  Clinical Impression  Pt was seen for bedside swallow evaluation with his wife present. She reported that the pt typically takes "a while" to [facility] but indicated that for the past two days his mastication has been more prolonged and he has demonstrated oral holding. He was lethargic during the evaluation but this improved some as the evaluation progressed. Pt did not follow commands despite tactile cues.  He exhibited symptoms of oropharyngeal dysphagia characterized by reduced bolus awareness, multiple swallows with purees and signs of aspiration with thin liquids, consecutive swallows of nectar thick liquids, and the initial bolus of puree solids. Pt initially required verbal prompts and tactile cues for bolus manipulation, but this improved during the evaluation and cueing was ultimately eliminated. He exhibited throat clearing with thin liquids via tsp and coughing with thin liquids via straw. Throat clearing was observed with the initial bolus of puree solids when bolus awareness was more impaired, but no subsequent signs of aspiration were noted. A dysphagia 1 (puree) diet with nectar thick liquids will be initiated at this time with strict observance of swallowing precautions listed below. Pt has demonstrated impulsive tendencies and, in the absence of cueing, demonstrated nine consecutive swallows of liquids; full supervision will be needed. The impact of the pt's lethargy on his performance is considered. SLP will follow to assess diet tolerance and for a modified barium swallow study if pt's symptoms persist.  SLP Visit Diagnosis: Dysphagia, unspecified (R13.10)    Aspiration Risk  Mild aspiration risk;Moderate aspiration risk    Diet Recommendation Dysphagia 1 (Puree);Nectar-thick liquid   Liquid Administration via: Cup;Straw Medication Administration: Crushed with puree Supervision: Full supervision/cueing for compensatory strategies;Staff to assist with self feeding Compensations: Slow rate;Small sips/bites;Other (Comment) (individual sips ) Postural Changes: Seated upright at 90 degrees;Remain upright for at least 30 minutes after po intake    Other  Recommendations Oral Care Recommendations: Oral care BID Other Recommendations: Order thickener from pharmacy   Follow up Recommendations  (TBD)      Frequency and Duration min 2x/week  2 weeks       Prognosis Barriers to Reach  Goals: Cognitive deficits      Swallow Study   General Date of Onset: 01/11/20 HPI: Pt is an 86 y.o. male with medical history significant for hyperlipidemia, CAD, A. fib on warfarin, GERD, CKD stage III, Parkinson's disease who presented to the ED via EMS after sustaining a fall at home. CXR 11/23: Worsening airspace disease in the left lower lung, suspect for pneumonia or potentially aspiration. Cardiomegaly. Pt was most recently seen by SLP on 03/17/13 and a dysphagia 2 diet with thin liquids was recommended at that time per pt's request d/t thrush. MBS 02/26/13: Pt. exhibited mild oral dysphagia with delayed transit exacerbated by xerostomia and decreased endurance.  Mild-mod sensory pharyngeal dysphagia with reduced tongue base retraction and laryngeal elevation resulting in trace vallecular/pyriform sinus/pharyngeal wall residue.   Type of Study: Bedside Swallow Evaluation Previous Swallow Assessment: None Diet Prior to this Study: NPO Temperature Spikes Noted: No Respiratory Status: Room air History of Recent Intubation: No Behavior/Cognition: Cooperative;Pleasant mood;Lethargic/Drowsy;Confused Oral Cavity Assessment: Within Functional Limits Oral Care Completed by SLP: No Oral Cavity - Dentition: Adequate natural dentition Vision: Functional for self-feeding Self-Feeding Abilities: Total assist Patient Positioning: Upright in bed;Postural control adequate for testing Baseline Vocal Quality: Normal Volitional Cough: Cognitively unable to elicit Volitional Swallow: Unable to elicit    Oral/Motor/Sensory Function Overall Oral Motor/Sensory Function: Other (comment) (Pt unable to participate)   Ice Chips  Ice chips: Impaired Oral Phase Impairments: Poor awareness of bolus;Reduced lingual movement/coordination   Thin Liquid Thin Liquid: Impaired Presentation: Spoon;Straw Pharyngeal  Phase Impairments: Throat Clearing - Immediate;Cough - Immediate;Cough - Delayed    Nectar Thick Nectar  Thick Liquid: Not tested   Honey Thick Honey Thick Liquid: Not tested   Puree Puree: Impaired Oral Phase Impairments: Poor awareness of bolus;Reduced lingual movement/coordination Pharyngeal Phase Impairments: Multiple swallows;Cough - Immediate (With initial two boluses when awareness was reduced)   Solid     Solid: Not tested     Burgandy Hackworth I. Hardin Negus, [place], [place] Office number [phone] Pager [phone]  Horton Marshall 01/13/2020,4:43 PM

## 2020-01-13 NOTE — Progress Notes (Signed)
Chaplain rec'd a consult from nurse at 1:43 pm to go in and speak with the family in a bit.  Chaplain asked more about situation and explained she was going off-duty very soon.  Nurse said the wife was bedside and had been present regularly with him and was fatigued and tearful.  A son is coming up for a visit today, and she will be leaving soon.  Code status is being discussed with the family.  Chaplain will refer patient to other staff chaplains this afternoon for follow up. Rev. Tamsen Snider Pager [phone]

## 2020-01-14 ENCOUNTER — Inpatient Hospital Stay (HOSPITAL_COMMUNITY): Payer: Medicare Other

## 2020-01-14 DIAGNOSIS — S72001A Fracture of unspecified part of neck of right femur, initial encounter for closed fracture: Secondary | ICD-10-CM | POA: Diagnosis not present

## 2020-01-14 LAB — CBC WITH DIFFERENTIAL/PLATELET
Abs Immature Granulocytes: 0.06 10*3/uL (ref 0.00–0.07)
Basophils Absolute: 0 10*3/uL (ref 0.0–0.1)
Basophils Relative: 0 %
Eosinophils Absolute: 0.1 10*3/uL (ref 0.0–0.5)
Eosinophils Relative: 1 %
HCT: 22.8 % — ABNORMAL LOW (ref 39.0–52.0)
Hemoglobin: 7.3 g/dL — ABNORMAL LOW (ref 13.0–17.0)
Immature Granulocytes: 1 %
Lymphocytes Relative: 7 %
Lymphs Abs: 0.9 10*3/uL (ref 0.7–4.0)
MCH: 29.8 pg (ref 26.0–34.0)
MCHC: 32 g/dL (ref 30.0–36.0)
MCV: 93.1 fL (ref 80.0–100.0)
Monocytes Absolute: 1.4 10*3/uL — ABNORMAL HIGH (ref 0.1–1.0)
Monocytes Relative: 11 %
Neutro Abs: 9.6 10*3/uL — ABNORMAL HIGH (ref 1.7–7.7)
Neutrophils Relative %: 80 %
Platelets: 152 10*3/uL (ref 150–400)
RBC: 2.45 MIL/uL — ABNORMAL LOW (ref 4.22–5.81)
RDW: 13.7 % (ref 11.5–15.5)
WBC: 12.1 10*3/uL — ABNORMAL HIGH (ref 4.0–10.5)
nRBC: 0 % (ref 0.0–0.2)

## 2020-01-14 LAB — COMPREHENSIVE METABOLIC PANEL
ALT: 19 U/L (ref 0–44)
AST: 36 U/L (ref 15–41)
Albumin: 2.6 g/dL — ABNORMAL LOW (ref 3.5–5.0)
Alkaline Phosphatase: 70 U/L (ref 38–126)
Anion gap: 9 (ref 5–15)
BUN: 26 mg/dL — ABNORMAL HIGH (ref 8–23)
CO2: 20 mmol/L — ABNORMAL LOW (ref 22–32)
Calcium: 8 mg/dL — ABNORMAL LOW (ref 8.9–10.3)
Chloride: 107 mmol/L (ref 98–111)
Creatinine, Ser: 1.29 mg/dL — ABNORMAL HIGH (ref 0.61–1.24)
GFR, Estimated: 54 mL/min — ABNORMAL LOW (ref >60)
Glucose, Bld: 112 mg/dL — ABNORMAL HIGH (ref 70–99)
Potassium: 3.7 mmol/L (ref 3.5–5.1)
Sodium: 136 mmol/L (ref 135–145)
Total Bilirubin: 1 mg/dL (ref 0.3–1.2)
Total Protein: 6.1 g/dL — ABNORMAL LOW (ref 6.5–8.1)

## 2020-01-14 LAB — IRON AND TIBC
Iron: 22 ug/dL — ABNORMAL LOW (ref 45–182)
Saturation Ratios: 10 % — ABNORMAL LOW (ref 17.9–39.5)
TIBC: 210 ug/dL — ABNORMAL LOW (ref 250–450)
UIBC: 188 ug/dL

## 2020-01-14 LAB — FERRITIN: Ferritin: 264 ng/mL (ref 24–336)

## 2020-01-14 LAB — VITAMIN B12: Vitamin B-12: 485 pg/mL (ref 180–914)

## 2020-01-14 LAB — PROTIME-INR
INR: 2.6 — ABNORMAL HIGH (ref 0.8–1.2)
Prothrombin Time: 26.8 seconds — ABNORMAL HIGH (ref 11.4–15.2)

## 2020-01-14 LAB — PHOSPHORUS: Phosphorus: 3.1 mg/dL (ref 2.5–4.6)

## 2020-01-14 LAB — FOLATE: Folate: 27 ng/mL (ref >5.9)

## 2020-01-14 LAB — MAGNESIUM: Magnesium: 2.1 mg/dL (ref 1.7–2.4)

## 2020-01-14 MED ORDER — RESOURCE THICKENUP CLEAR PO POWD
ORAL | Status: DC | PRN
  Filled 2020-01-14 (×2): qty 125

## 2020-01-14 MED ORDER — WARFARIN SODIUM 1 MG PO TABS
1.0000 mg | ORAL_TABLET | Freq: Once | ORAL | Status: AC
  Administered 2020-01-14: 1 mg via ORAL
  Filled 2020-01-14: qty 1

## 2020-01-14 MED ORDER — PIPERACILLIN-TAZOBACTAM 3.375 G IVPB
3.3750 g | Freq: Three times a day (TID) | INTRAVENOUS | Status: AC
  Administered 2020-01-14 – 2020-01-20 (×20): 3.375 g via INTRAVENOUS
  Filled 2020-01-14 (×20): qty 50

## 2020-01-14 MED ORDER — FERROUS SULFATE 325 (65 FE) MG PO TABS
325.0000 mg | ORAL_TABLET | Freq: Every day | ORAL | Status: DC
  Administered 2020-01-15 – 2020-01-23 (×9): 325 mg via ORAL
  Filled 2020-01-14 (×9): qty 1

## 2020-01-14 MED ORDER — ACETAMINOPHEN 10 MG/ML IV SOLN: 1000.0000 mg | Freq: Four times a day (QID) | INTRAVENOUS | Status: AC | PRN

## 2020-01-14 NOTE — Progress Notes (Signed)
  Speech Language Pathology Treatment: Dysphagia  Patient Details Name: Robert Richard MRN: [id number] DOB: 06-Mar-1930 Today's Date: 01/14/2020 Time: [id number] SLP Time Calculation (min) (ACUTE ONLY): 15 min  Assessment / Plan / Recommendation Clinical Impression  SLP discussed with pt and his wife regarding premorbid swallow function and prior esophagram completed 2015 for intermittent difficulties with liquids and oral candidiasis; Prior esophagram was unremarkable but pt has been treated for Parkinson's in the last 2-3 years.  Spouse reports pt has had significant weight loss but denies significant issues with swallowing.  Reviewed potential for MBS study to allow observation of oropharyngeal swallow ability given concerns for aspiration pna.  Also reviewed increased risk factors for asp pna at this time.  SLP did not observe pt with po intake at this time due to his lethargy and his hiccups.  Wife reports he is often sleepy after he eats.   Wife agreeable to plan for MBS tomorrow with pt fully alert and sans hiccups to help elucidate soure of dysphagia.  Advised for pt to eat small bites/sips when fully alert and sitting fully upright as tolerated pending exam.    HPI HPI: Pt is an 86 y.o. male with medical history significant for hyperlipidemia, CAD, A. fib on warfarin, GERD, CKD stage III, Parkinson's disease who presented to the ED via EMS after sustaining a fall at home. CXR 11/23: Worsening airspace disease in the left lower lung, suspect for pneumonia or potentially aspiration. Cardiomegaly. Pt was most recently seen by SLP on 03/17/13 and a dysphagia 2 diet with thin liquids was recommended at that time per pt's request d/t thrush. MBS 02/26/13: Pt. exhibited mild oral dysphagia with delayed transit exacerbated by xerostomia and decreased endurance.  Mild-mod sensory pharyngeal dysphagia with reduced tongue base retraction and laryngeal elevation resulting in trace vallecular/pyriform  sinus/pharyngeal wall residue.    Concerns for aspiration pna present.      SLP Plan  MBS       Recommendations  Diet recommendations: Dysphagia 3 (mechanical soft);Nectar-thick liquid Medication Administration: Crushed with puree Supervision: Full supervision/cueing for compensatory strategies Compensations: Slow rate;Small sips/bites;Other (Comment) (individual sips ) Postural Changes and/or Swallow Maneuvers: Seated upright 90 degrees;Upright 30-60 min after meal                Oral Care Recommendations: Oral care BID Follow up Recommendations:  (TBD) SLP Visit Diagnosis: Dysphagia, unspecified (R13.10) Plan: MBS       GO                Macario Golds 01/14/2020, 7:15 PM  Kathleen Lime, MS [facility] SLP Acute Rehab Services Office [phone] Pager [phone]

## 2020-01-14 NOTE — Progress Notes (Signed)
PHARMACY NOTE -  [facility] has been assisting with dosing of Zosyn for UTI and aspiration PNA.  Dosage remains stable at 3.375 g IV q8 hr and need for further dosage adjustment appears unlikely at present given SCr improving.  Pharmacy will sign off, following peripherally for culture results or dose adjustments. Please reconsult if a change in clinical status warrants re-evaluation of dosage.  Reuel Boom, PharmD, BCPS [phone] 01/14/2020, 8:06 AM

## 2020-01-14 NOTE — Progress Notes (Signed)
Robert Richard  MRN: [id number] DOB/Age: 06-07-1930 86 y.o.  Orthopedics Procedure: Procedure(s) (LRB): INTRAMEDULLARY (IM) NAIL INTERTROCHANTRIC (Right)     Subjective: Awake and alert, complaining of sore throat. Wife by bedside  Vital Signs Temp:  [97.9 F (36.6 C)-98.6 F (37 C)] 97.9 F (36.6 C) (11/24 1957) Pulse Rate:  [67-94] 67 (11/25 0628) Resp:  [18-20] 18 (11/24 1957) BP: (106-124)/(49-58) 118/50 (11/25 0628) SpO2:  [92 %-97 %] 92 % (11/25 0628)  Lab Results Recent Labs    01/13/20 0515 01/14/20 0416  WBC 12.6* 12.1*  HGB 7.4* 7.3*  HCT 22.4* 22.8*  PLT 117* 152   BMET Recent Labs    01/13/20 1237 01/14/20 0416  NA 134* 136  K 3.9 3.7  CL 105 107  CO2 20* 20*  GLUCOSE 117* 112*  BUN 28* 26*  CREATININE 1.35* 1.29*  CALCIUM 8.0* 8.0*   INR  Date Value Ref Range Status  01/14/2020 2.6 (H) 0.8 - 1.2 Final    Comment:    (NOTE) INR goal varies based on device and disease states. Performed at [facility], [place] [address] [place], [place] [address]      Exam Hip dressing dry NVI        Plan Spoke with Nurse who will get chloraseptic spray and try to get him to chair today  Jenetta Loges PA-C  01/14/2020, 8:41 AM Contact # [phone]

## 2020-01-14 NOTE — Progress Notes (Signed)
[place] for warfarin and lovenox Indication: atrial fibrillation and AVR  Allergies  Allergen Reactions  . Iodinated Diagnostic Agents Hives    Other reaction(s): RASH Other reaction(s): RASH  . Ioxaglate Hives    Other reaction(s): RASH    Patient Measurements: Height: 6\' 2"  (188 cm) Weight: 65.8 kg (145 lb) IBW/kg (Calculated) : 82.2 Heparin Dosing Weight:   Vital Signs: BP: 118/50 (11/25 0628) Pulse Rate: 67 (11/25 0628)  Labs: Recent Labs    01/12/20 0440 01/12/20 0440 01/13/20 0515 01/13/20 1237 01/14/20 0416  HGB 8.1*   < > 7.4*  --  7.3*  HCT 23.8*  --  22.4*  --  22.8*  PLT 99*  --  117*  --  152  LABPROT 18.2*  --  23.5*  --  26.8*  INR 1.6*  --  2.2*  --  2.6*  CREATININE 1.26*  --   --  1.35* 1.29*   < > = values in this interval not displayed.    Estimated Creatinine Clearance: 37.5 mL/min (A) (by C-G formula based on SCr of 1.29 mg/dL (H)).   Medications:  - PTA warfarin regimen: 2mg  daily  Assessment: Patient is an 86 y.o M with hx afib and AVR on warfarin PTA, presented to the ED on 11/19 s/p fall with right hip fracture.  He underwent right femur intramedullary fixation on 11/21. Warfarin and lovenox 0.5mg /kg q12h started post-op on 11/21.  - 11/20: vit K 10mg  PO x1 - 11/22: 2 units PRBC - 11/24: possible aspiration; warfarin held, made NPO thru11/24 PM  Today, 01/14/2020: - INR remains therapeutic and increasing despite holding warfarin yesterday for possible aspiration - hgb low but stable from yesterday (likely secondary to ortho postop ABLA); Plt improved to WNL - drug-drug intxns: broad spectrum abx (CTX 11/23 >> 11/25 Zosyn >> ) can increase warfarin sensitivity - Dys1 diet ordered, PO intake not charted    Goal of Therapy:  INR 2-3 Monitor platelets by anticoagulation protocol: Yes   Plan:  - warfarin 1 mg PO x1 today - daily INR - monitor for s/sx bleeding  Jessalyn Hinojosa  A 01/14/2020,8:07 AM

## 2020-01-14 NOTE — Progress Notes (Signed)
PROGRESS NOTE    Robert Richard  [name]:[id number] DOB: 12-07-1930 DOA: 01/02/2020 PCP: [name], MD   Chief Complaint  Patient presents with  . Fall  . Leg Injury   Brief Narrative:  Patient is 86yo patient with CAD, hyperlipidemia, Robert Richard. fib on warfarin, GERD, CKD stage III, Parkinson's disease presented to ED after sustaining Robert Richard fall at home.  History per his wife, patient had gotten out of the shower and while he was in the bathroom, he fell and landed on the right side of his face, right hip.  Patient sustained facial laceration with scalp contusion.  He also complained of right hip pain with difficulty in being able to bear weight on the right leg due to pain.  EMS was called. In ED BP was soft-2/52, and leukocytosis with normocytic anemia.  Creatinine 1.4.  CT imaging showed no acute intracranial abnormality but showed right frontal/supraorbital swelling and laceration extending to the temporal soft tissues, mild right liver contusion and laceration.  No facial bone fracture. Hip x-ray showed right hip fracture Patient was transferred [facility] for further work-up  Assessment & Plan:   Principal Problem:   Closed right hip fracture, initial encounter [facility]) Active Problems:   Hyperlipidemia   CKD (chronic kidney disease)   CAD (coronary artery disease)   Atrial fibrillation with RVR ([name])   Diabetes mellitus without complication (HCC)   GERD (gastroesophageal reflux disease)   Parkinson's disease ([name])   Facial laceration   Leukocytosis   Hyperglycemia   Fall at home, initial encounter  Acute Metabolic Encephalopathy: suspect 2/2 acute hospitalization, pain, recent surgery, aspiration pneumonia Repeat head CT 11/24 without acute intracranial abnormality Delirium precautions  Treat aspiration pneumonia and pseudomonas UTI He's improving from mental status standpoint today  Aspiration Pneumonia  Pseudomonas UTI CXR 11/23 with worsening airspace disease in LLL,  concerning for pneumonia vs aspiration 11/25 CXR with persistent L mid and L lower lung airspace disease, new airspace densities within the RU and RL lung Continue antibiotics -> zosyn  Blood cx NG Urine cx with pseudomonas SLP eval with mild/moderate aspiration risk -> dysphagia 1 diet, nectar thick liquid  Acute comminuted closed right hip fracture, after mechanical fall at home [name]) -Right hip x-ray showed acute comminuted intertrochanteric fracture of the right hip with superior subluxation -Underwent intramedullary fixation of the right femur (Dr Robert Richard), on 61/95 -Complicated by anemia and Robert Richard. fib with RVR on 11/22, doing well today, will start PT - Continue warfarin for DVT ppx - therapy recommending SNF  Atrial fibrillation with RVR -Postop in atrial fibrillation with RVR on 11/22 requiring digoxin, IV Cardizem. Resumed oral Cardizem 240 mg daily -Coumadin per pharmacy, cardiology following  Acute on chronic blood loss anemia, postop -Hemoglobin 7.2 on 11/22, status post 2 units packed RBC transfusion -Relatively stable, labs with iron def anemia - PO iron   Right frontal/supraorbital swelling and laceration -Continue wound care, no active bleeding  Leukocytosis  Treated for aspiration pneumonia above  Diabetes mellitus type 2, diet controlled -Hemoglobin A1c 6.5 08/09/2019 -Continue carb modified diet  CKD stageIIIa -Baseline creatinine ~1.5 -Avoid nephrotoxic medications, hypotension -continue to monitor  CAD, hyperlipidemia Continue Crestor  GERD -Continue Protonix   Parkinson's disease Continue Sinemet  Underweight, protein calorie malnutrition Estimated body mass index is 18.62 kg/m as calculated from the following:   Height as of this encounter: 6\' 2"  (1.88 m).   Weight as of this encounter: 65.8 kg.  Nutritional supplements  DVT prophylaxis:  warfarin Code Status: DNR Family Communication:  wife at bedside Disposition:   Status is:  Inpatient  Remains inpatient appropriate because:Inpatient level of care appropriate due to severity of illness   Dispo: The patient is from: Home              Anticipated d/c is to: SNF              Anticipated d/c date is: > 3 days              Patient currently is not medically stable to d/c.       Consultants:   Palliative care  Cardiology  ortho  Procedures:  intramedullary fixation of the right femur (Dr Robert Richard), on 11/21  Antimicrobials:  Anti-infectives (From admission, onward)   Start     Dose/Rate Route Frequency Ordered Stop   01/14/20 0830  piperacillin-tazobactam (ZOSYN) IVPB 3.375 g        3.375 g 12.5 mL/hr over 240 Minutes Intravenous Every 8 hours 01/14/20 0806     01/12/20 2100  vancomycin (VANCOCIN) IVPB 1000 mg/200 mL premix        1,000 mg 200 mL/hr over 60 Minutes Intravenous  Once 01/12/20 2001 01/13/20 0548   01/12/20 2030  cefTRIAXone (ROCEPHIN) 1 g in sodium chloride 0.9 % 100 mL IVPB  Status:  Discontinued        1 g 200 mL/hr over 30 Minutes Intravenous Every 24 hours 01/12/20 1930 01/14/20 0756   12/22/2019 1500  ceFAZolin (ANCEF) IVPB 2g/100 mL premix        2 g 200 mL/hr over 30 Minutes Intravenous Every 6 hours 01/03/2020 1113 12/31/2019 2141     Subjective: Disoriented, denies complaint  Objective: Vitals:   01/13/20 1110 01/13/20 1842 01/13/20 1957 01/14/20 0628  BP: (!) 117/49 (!) 114/49 (!) 115/57 (!) 118/50  Pulse: 87 70 85 67  Resp: 18 20 18    Temp: 98 F (36.7 C) 98.6 F (37 C) 97.9 F (36.6 C)   TempSrc: Oral Oral    SpO2: 97% 94% 96% 92%  Weight:      Height:        Intake/Output Summary (Last 24 hours) at 01/14/2020 1416 Last data filed at 01/14/2020 0836 Gross per 24 hour  Intake 2337.5 ml  Output 2700 ml  Net -362.5 ml   Filed Weights   12/22/2019 2309 12/29/2019 2312  Weight: 61.2 kg 65.8 kg    Examination:  General: No acute distress. Cardiovascular: Heart sounds show Belma Dyches regular rate, and rhythm.  Lungs:  Clear to auscultation bilaterally Abdomen: Soft, nontender, nondistended Neurological: Alert and disoriented.  More alert today, able to hold Katrine Radich conversation. Moves all extremities 4. Cranial nerves II through XII grossly intact. Skin: Warm and dry. No rashes or lesions. Extremities: No clubbing or cyanosis. No edema. Dressing intact to RLE  Data Reviewed: I have personally reviewed following labs and imaging studies  CBC: Recent Labs  Lab 12/24/2019 0053 01/19/2020 0053 01/15/2020 0519 01/12/2020 0519 01/11/20 0500 01/11/20 2226 01/12/20 0440 01/13/20 0515 01/14/20 0416  WBC 16.4*   < > 11.4*  --  14.5*  --  14.5* 12.6* 12.1*  NEUTROABS 13.9*  --   --   --   --   --   --   --  9.6*  HGB 9.6*   < > 8.8*   < > 7.2* 8.1* 8.1* 7.4* 7.3*  HCT 30.5*   < > 27.1*   < > 22.5* 24.2* 23.8*  22.4* 22.8*  MCV 95.0   < > 92.5  --  93.4  --  89.1 91.1 93.1  PLT 194   < > 184  --  146*  --  99* 117* 152   < > = values in this interval not displayed.    Basic Metabolic Panel: Recent Labs  Lab 01/07/2020 0519 01/11/20 0500 01/12/20 0440 01/13/20 1237 01/14/20 0416  NA 140 137 135 134* 136  K 4.7 4.5 4.0 3.9 3.7  CL 110 107 106 105 107  CO2 19* 20* 18* 20* 20*  GLUCOSE 139* 157* 127* 117* 112*  BUN 28* 32* 28* 28* 26*  CREATININE 1.59* 1.62* 1.26* 1.35* 1.29*  CALCIUM 8.9 8.3* 8.2* 8.0* 8.0*  MG 2.0  --   --   --  2.1  PHOS 3.6  --   --   --  3.1    GFR: Estimated Creatinine Clearance: 37.5 mL/min (Franchesca Veneziano) (by C-G formula based on SCr of 1.29 mg/dL (H)).  Liver Function Tests: Recent Labs  Lab 01/14/2020 0053 12/21/2019 0519 01/13/20 1237 01/14/20 0416  AST 20 22 37 36  ALT 13 5 9 19   ALKPHOS 82 75 64 70  BILITOT 0.6 0.9 1.0 1.0  PROT 7.3 7.1 5.9* 6.1*  ALBUMIN 3.5 3.5 2.7* 2.6*    CBG: Recent Labs  Lab 01/11/20 2110  GLUCAP 178*     Recent Results (from the past 240 hour(s))  Resp Panel by RT-PCR (Flu Samuell Knoble&B, Covid) Nasopharyngeal Swab     Status: None   Collection Time:  12/30/2019 11:50 PM   Specimen: Nasopharyngeal Swab; Nasopharyngeal(NP) swabs in vial transport medium  Result Value Ref Range Status   SARS Coronavirus 2 by RT PCR NEGATIVE NEGATIVE Final    Comment: (NOTE) SARS-CoV-2 target nucleic acids are NOT DETECTED.  The SARS-CoV-2 RNA is generally detectable in upper respiratory specimens during the acute phase of infection. The lowest concentration of SARS-CoV-2 viral copies this assay can detect is 138 copies/mL. Ronnell Clinger negative result does not preclude SARS-Cov-2 infection and should not be used as the sole basis for treatment or other patient management decisions. Chizaram Latino negative result may occur with  improper specimen collection/handling, submission of specimen other than nasopharyngeal swab, presence of viral mutation(s) within the areas targeted by this assay, and inadequate number of viral copies(<138 copies/mL). Margie Urbanowicz negative result must be combined with clinical observations, patient history, and epidemiological information. The expected result is Negative.  Fact Sheet for Patients:  EntrepreneurPulse.com.au  Fact Sheet for Healthcare Providers:  IncredibleEmployment.be  This test is no t yet approved or cleared by the [place] FDA and  has been authorized for detection and/or diagnosis of SARS-CoV-2 by FDA under an Emergency Use Authorization (EUA). This EUA will remain  in effect (meaning this test can be used) for the duration of the COVID-19 declaration under Section 564(b)(1) of the Act, 21 U.S.C.section 360bbb-3(b)(1), unless the authorization is terminated  or revoked sooner.       Influenza Ashtian Villacis by PCR NEGATIVE NEGATIVE Final   Influenza B by PCR NEGATIVE NEGATIVE Final    Comment: (NOTE) The Xpert Xpress SARS-CoV-2/FLU/RSV plus assay is intended as an aid in the diagnosis of influenza from Nasopharyngeal swab specimens and should not be used as Malaina Mortellaro sole basis for treatment. Nasal washings  and aspirates are unacceptable for Xpert Xpress SARS-CoV-2/FLU/RSV testing.  Fact Sheet for Patients: EntrepreneurPulse.com.au  Fact Sheet for Healthcare Providers: IncredibleEmployment.be  This test is not yet approved or cleared by  the [employer] and has been authorized for detection and/or diagnosis of SARS-CoV-2 by FDA under an Emergency Use Authorization (EUA). This EUA will remain in effect (meaning this test can be used) for the duration of the COVID-19 declaration under Section 564(b)(1) of the Act, 21 U.S.C. section 360bbb-3(b)(1), unless the authorization is terminated or revoked.  Performed at [facility], [address] [place], [place] [address]   Surgical pcr screen     Status: None   Collection Time: 01/02/2020  9:02 PM   Specimen: Nasal Mucosa; Nasal Swab  Result Value Ref Range Status   MRSA, PCR NEGATIVE NEGATIVE Final   Staphylococcus aureus NEGATIVE NEGATIVE Final    Comment: (NOTE) The Xpert SA Assay (FDA approved for NASAL specimens in patients 41 years of age and older), is one component of Gearold Wainer comprehensive surveillance program. It is not intended to diagnose infection nor to guide or monitor treatment. Performed at [facility], [place] [address] [place], [place] [address]   Culture, blood (routine x 2)     Status: None (Preliminary result)   Collection Time: 01/12/20  7:21 PM   Specimen: BLOOD  Result Value Ref Range Status   Specimen Description   Final    BLOOD RIGHT ANTECUBITAL Performed at [facility] [address], [place], [place] [address]    Special Requests   Final    BOTTLES DRAWN AEROBIC AND ANAEROBIC Blood Culture adequate volume Performed at [facility] [address], [place], [place] [address]    Culture   Final    NO GROWTH 2 DAYS Performed at [facility] [address] [place], [place] [address]    Report Status  PENDING  Incomplete  Culture, blood (routine x 2)     Status: None (Preliminary result)   Collection Time: 01/12/20  7:22 PM   Specimen: BLOOD  Result Value Ref Range Status   Specimen Description   Final    BLOOD LEFT ARM Performed at [facility] [address], [place], [place] [address]    Special Requests   Final    BOTTLES DRAWN AEROBIC ONLY Blood Culture adequate volume Performed at [facility] [address], [place], [place] [address]    Culture   Final    NO GROWTH 2 DAYS Performed at [facility] [address] [place], [place] [address]    Report Status PENDING  Incomplete  Culture, Urine     Status: Abnormal (Preliminary result)   Collection Time: 01/13/20  1:20 AM   Specimen: Urine, Random  Result Value Ref Range Status   Specimen Description   Final    URINE, RANDOM Performed at [facility] [address], [place], [place] [address]    Special Requests   Final    NONE Performed at [facility], [place] [address], [place], [place] [address]    Culture ([place])  Final    50,000 COLONIES/mL PSEUDOMONAS AERUGINOSA SUSCEPTIBILITIES TO FOLLOW Performed at [facility], [place] [address] [place], [place] [address]    Report Status PENDING  Incomplete         Radiology Studies: CT HEAD WO CONTRAST  Result Date: 01/13/2020 CLINICAL DATA:  Altered mental status. EXAM: CT HEAD WITHOUT CONTRAST TECHNIQUE: Contiguous axial images were obtained from the base of the skull through the vertex without intravenous contrast. COMPARISON:  January 09, 2020 FINDINGS: Brain: There is mild  to moderate severity cerebral atrophy with widening of the extra-axial spaces and ventricular dilatation. There are areas of decreased attenuation within the white matter tracts of the supratentorial brain, consistent with microvascular disease changes. Vascular: No hyperdense vessel or unexpected  calcification. Skull: Normal. Negative for fracture or focal lesion. Sinuses/Orbits: No acute finding. Other: Mild right frontal and right frontoparietal scalp soft tissue swelling is seen. IMPRESSION: 1. Mild right frontal and right frontoparietal scalp soft tissue swelling without evidence of an acute fracture or acute intracranial abnormality. 2. Cerebral atrophy and microvascular disease changes of the supratentorial brain. Electronically Signed   By: Virgina Norfolk M.D.   On: 01/13/2020 19:55   DG CHEST PORT 1 VIEW  Result Date: 01/14/2020 CLINICAL DATA:  Evaluate pneumonia EXAM: PORTABLE CHEST 1 VIEW COMPARISON:  01/12/2020 FINDINGS: Previous median sternotomy, aortic valve replacement and CABG. Aortic atherosclerotic calcifications identified. Airspace disease within the left midlung and left lower lung are unchanged in the interval. New, developing opacity within the right lower lung and right upper lobe noted. IMPRESSION: 1. Persistent left mid and left lower lung airspace disease. 2. New airspace densities within the right upper and right lower lung. Electronically Signed   By: Kerby Moors M.D.   On: 01/14/2020 07:31   DG CHEST PORT 1 VIEW  Result Date: 01/12/2020 CLINICAL DATA:  Status post right IM nail fever EXAM: PORTABLE CHEST 1 VIEW COMPARISON:  01/01/2020, 10/15/2017 FINDINGS: Worsening airspace disease in the left lower lung. Mild cardiomegaly with aortic atherosclerosis. No pneumothorax. Post sternotomy changes with valve prosthesis. IMPRESSION: Worsening airspace disease in the left lower lung, suspect for pneumonia or potentially aspiration. Cardiomegaly. Electronically Signed   By: Donavan Foil M.D.   On: 01/12/2020 20:00        Scheduled Meds: . sodium chloride   Intravenous Once  . carbidopa-levodopa  1 tablet Oral TID  . diltiazem  240 mg Oral Daily  . docusate sodium  100 mg Oral BID  . feeding supplement  237 mL Oral BID BM  . gabapentin  100 mg Oral QHS  .  ketotifen  1 drop Both Eyes Daily  . multivitamin with minerals   Oral Daily  . pantoprazole  40 mg Oral Daily  . rosuvastatin  2.5 mg Oral QODAY  . topiramate  75 mg Oral Daily  . warfarin  1 mg Oral ONCE-1600  . Warfarin - Pharmacist Dosing Inpatient   Does not apply q1600   Continuous Infusions: . acetaminophen    . chlorproMAZINE (THORAZINE) IV 12.5 mg (01/13/20 1506)  . lactated ringers 100 mL/hr at 01/14/20 1243  . piperacillin-tazobactam (ZOSYN)  IV 3.375 g (01/14/20 1308)     LOS: 5 days    Time spent: over 105 min    Fayrene Helper, MD [facility]   To contact the attending provider between 7A-7P or the covering provider during after hours 7P-7A, please log into the web site [email or web address] and access using universal [place] password for that web site. If you do not have the password, please call the hospital operator.  01/14/2020, 2:16 PM

## 2020-01-15 ENCOUNTER — Inpatient Hospital Stay (HOSPITAL_COMMUNITY): Payer: Medicare Other

## 2020-01-15 DIAGNOSIS — S72001A Fracture of unspecified part of neck of right femur, initial encounter for closed fracture: Secondary | ICD-10-CM | POA: Diagnosis not present

## 2020-01-15 LAB — URINE CULTURE: Culture: 50000 — AB

## 2020-01-15 LAB — CBC
HCT: 23 % — ABNORMAL LOW (ref 39.0–52.0)
Hemoglobin: 7.4 g/dL — ABNORMAL LOW (ref 13.0–17.0)
MCH: 30.2 pg (ref 26.0–34.0)
MCHC: 32.2 g/dL (ref 30.0–36.0)
MCV: 93.9 fL (ref 80.0–100.0)
Platelets: 165 10*3/uL (ref 150–400)
RBC: 2.45 MIL/uL — ABNORMAL LOW (ref 4.22–5.81)
RDW: 13.8 % (ref 11.5–15.5)
WBC: 11.8 10*3/uL — ABNORMAL HIGH (ref 4.0–10.5)
nRBC: 0 % (ref 0.0–0.2)

## 2020-01-15 LAB — COMPREHENSIVE METABOLIC PANEL
ALT: 12 U/L (ref 0–44)
AST: 40 U/L (ref 15–41)
Albumin: 2.4 g/dL — ABNORMAL LOW (ref 3.5–5.0)
Alkaline Phosphatase: 94 U/L (ref 38–126)
Anion gap: 9 (ref 5–15)
BUN: 26 mg/dL — ABNORMAL HIGH (ref 8–23)
CO2: 17 mmol/L — ABNORMAL LOW (ref 22–32)
Calcium: 7.8 mg/dL — ABNORMAL LOW (ref 8.9–10.3)
Chloride: 108 mmol/L (ref 98–111)
Creatinine, Ser: 1.27 mg/dL — ABNORMAL HIGH (ref 0.61–1.24)
GFR, Estimated: 55 mL/min — ABNORMAL LOW (ref >60)
Glucose, Bld: 137 mg/dL — ABNORMAL HIGH (ref 70–99)
Potassium: 3.7 mmol/L (ref 3.5–5.1)
Sodium: 134 mmol/L — ABNORMAL LOW (ref 135–145)
Total Bilirubin: 1.2 mg/dL (ref 0.3–1.2)
Total Protein: 6 g/dL — ABNORMAL LOW (ref 6.5–8.1)

## 2020-01-15 LAB — PHOSPHORUS: Phosphorus: 2.5 mg/dL (ref 2.5–4.6)

## 2020-01-15 LAB — LEGIONELLA PNEUMOPHILA SEROGP 1 UR AG: L. pneumophila Serogp 1 Ur Ag: NEGATIVE

## 2020-01-15 LAB — MAGNESIUM: Magnesium: 2 mg/dL (ref 1.7–2.4)

## 2020-01-15 LAB — PROTIME-INR
INR: 2.9 — ABNORMAL HIGH (ref 0.8–1.2)
Prothrombin Time: 29.3 seconds — ABNORMAL HIGH (ref 11.4–15.2)

## 2020-01-15 MED ORDER — NYSTATIN 100000 UNIT/ML MT SUSP
5.0000 mL | Freq: Four times a day (QID) | OROMUCOSAL | Status: DC
  Administered 2020-01-15 – 2020-01-23 (×31): 500000 [IU] via ORAL
  Filled 2020-01-15 (×32): qty 5

## 2020-01-15 MED ORDER — LIDOCAINE HCL (PF) 4 % IJ SOLN: Freq: Every day | INTRAMUSCULAR | Status: DC

## 2020-01-15 MED ORDER — PANTOPRAZOLE SODIUM 40 MG PO PACK
40.0000 mg | PACK | Freq: Every day | ORAL | Status: DC
  Administered 2020-01-17 – 2020-01-19 (×3): 40 mg via ORAL
  Filled 2020-01-15 (×4): qty 20

## 2020-01-15 MED ORDER — DILTIAZEM HCL 60 MG PO TABS
60.0000 mg | ORAL_TABLET | Freq: Four times a day (QID) | ORAL | Status: DC
  Administered 2020-01-15 – 2020-01-24 (×36): 60 mg via ORAL
  Filled 2020-01-15 (×37): qty 1

## 2020-01-15 MED ORDER — NON FORMULARY: 1.0000 | Freq: Every day | Status: DC

## 2020-01-15 MED ORDER — DOCUSATE SODIUM 50 MG/5ML PO LIQD
100.0000 mg | Freq: Two times a day (BID) | ORAL | Status: DC
  Administered 2020-01-16 – 2020-01-23 (×13): 100 mg via ORAL
  Filled 2020-01-15 (×22): qty 10

## 2020-01-15 MED ORDER — GABAPENTIN 250 MG/5ML PO SOLN
100.0000 mg | Freq: Every day | ORAL | Status: DC
  Administered 2020-01-15 – 2020-01-18 (×4): 100 mg via ORAL
  Filled 2020-01-15 (×4): qty 2

## 2020-01-15 NOTE — Progress Notes (Signed)
[place] for warfarin Indication: atrial fibrillation and AVR  Allergies  Allergen Reactions  . Iodinated Diagnostic Agents Hives    Other reaction(s): RASH Other reaction(s): RASH  . Ioxaglate Hives    Other reaction(s): RASH    Patient Measurements: Height: 6\' 2"  (188 cm) Weight: 65.8 kg (145 lb) IBW/kg (Calculated) : 82.2 Heparin Dosing Weight:   Vital Signs: Temp: 98.5 F (36.9 C) (11/26 0554) Temp Source: Oral (11/26 0554) BP: 113/64 (11/26 0554) Pulse Rate: 93 (11/26 0554)  Labs: Recent Labs    01/13/20 0515 01/13/20 0515 01/13/20 1237 01/14/20 0416 01/15/20 0450  HGB 7.4*   < >  --  7.3* 7.4*  HCT 22.4*  --   --  22.8* 23.0*  PLT 117*  --   --  152 165  LABPROT 23.5*  --   --  26.8* 29.3*  INR 2.2*  --   --  2.6* 2.9*  CREATININE  --   --  1.35* 1.29* 1.27*   < > = values in this interval not displayed.    Estimated Creatinine Clearance: 38.1 mL/min (A) (by C-G formula based on SCr of 1.27 mg/dL (H)).   Medications:  - PTA warfarin regimen: 2mg  daily  Assessment: Patient is an 86 y.o M with hx afib and AVR on warfarin PTA, presented to the ED on 11/19 s/p fall with right hip fracture.  He underwent right femur intramedullary fixation on 11/21. Warfarin and lovenox 0.5mg /kg q12h started post-op on 11/21.  - 11/20: vit K 10mg  PO x1 - 11/22: 2 units PRBC  Today, 01/15/2020: - INR is therapeutic, but increased from 2.6 to 2.9 today - hgb and plts low, but stable - drug-drug intxns: being on abx (zosyn) can make patient more sensitive to warfarin - in afib  Goal of Therapy:  INR 2-3 Monitor platelets by anticoagulation protocol: Yes   Plan:  - With INR trending up and is at upper end of therapeutic range, will hold warfarin dose today - daily INR - monitor for s/sx bleeding  Robert Richard P 01/15/2020,10:39 AM

## 2020-01-15 NOTE — Progress Notes (Signed)
Subjective: 5 Days Post-Op Procedure(s) (LRB): INTRAMEDULLARY (IM) NAIL INTERTROCHANTRIC (Right)  Patient resting in bed.  Appears drowsy this morning.  Able to respond and answer questions appropriately.  Wife at bedside.  Objective:   VITALS:  Temp:  [97.3 F (36.3 C)-99.3 F (37.4 C)] 98.5 F (36.9 C) (11/26 0554) Pulse Rate:  [77-97] 93 (11/26 0554) Resp:  [16-21] 21 (11/26 0554) BP: (113-175)/(53-74) 113/64 (11/26 0554) SpO2:  [92 %-94 %] 94 % (11/26 0554)  General: WDWN patient in NAD. Psych:  Appropriate mood and affect, yet drowsy this am. Neuro:  A&O x 3, Moving all extremities, sensation intact to light touch HEENT:  EOMs intact Chest:  Even non-labored respirations Skin: Dressing C/D/I, no rashes or lesions Extremities: warm/dry, mild edema to R thigh, no erythema or echymosis.  No lymphadenopathy. Pulses: Popliteus 2+ MSK:  ROM: TKE, MMT: able to perform quad set, (-) Homan's    LABS Recent Labs    01/13/20 0515 01/13/20 0515 01/14/20 0416 01/15/20 0450  HGB 7.4*  --  7.3* 7.4*  WBC 12.6*   < > 12.1* 11.8*  PLT 117*   < > 152 165   < > = values in this interval not displayed.   Recent Labs    01/14/20 0416 01/15/20 0450  NA 136 134*  K 3.7 3.7  CL 107 108  CO2 20* 17*  BUN 26* 26*  CREATININE 1.29* 1.27*  GLUCOSE 112* 137*   Recent Labs    01/14/20 0416 01/15/20 0450  INR 2.6* 2.9*     Assessment/Plan: 5 Days Post-Op Procedure(s) (LRB): INTRAMEDULLARY (IM) NAIL INTERTROCHANTRIC (Right)  Patient seen in rounds for Dr. Areta Haber R LE with walker Up with therapy DVT ppx:  Warfarin Disp:  SNF Plan for outpatient post-op visit with Dr. [name].  Mechele Claude PA-C [facility] Office:  [phone]

## 2020-01-15 NOTE — Progress Notes (Addendum)
Physical Therapy Treatment Patient Details Name: Robert Richard MRN: [id number] DOB: 01-29-1931 Today's Date: 01/15/2020    History of Present Illness 86 yo male admitted with R hip fracture after falling at home. S/P IM nail 12/21/2019. Transferred to progressive unit after having Afib with RVR. Hx of Parkinson's, A fib, DM, COPD, gout, RLS, CABG, osteoporosis, CKD    PT Comments    Pt lethargic, respiratory rate  29 at rest, SaO2 92% on room air. +2 total assist for supine to sit. Pt lethargic, unable to follow commands, able to state birth date but not location nor situation. Pt sat at edge of bed for ~3 minutes with min assist for balance. Did not attempt transfers due to pt's lethargy. Performed PROM to RLE.    Follow Up Recommendations  SNF     Equipment Recommendations  None recommended by PT    Recommendations for Other Services       Precautions / Restrictions Restrictions RLE Weight Bearing: Weight bearing as tolerated    Mobility  Bed Mobility Overal bed mobility: Needs Assistance Bed Mobility: Supine to Sit;Sit to Supine     Supine to sit: Total assist;+2 for physical assistance;+2 for safety/equipment;HOB elevated Sit to supine: Total assist;+2 for physical assistance;+2 for safety/equipment   General bed mobility comments: Assist for trunk and bil LEs. Utilized bedpad for scooting, positioning. Multimodal cueing required. Pt sat edge of bed for ~3 minutes with min/guard to min A for balance.  Transfers                 General transfer comment: did not attempt 2* poor sitting balance, pt lethargy and difficulty following commands  Ambulation/Gait                 Stairs             Wheelchair Mobility    Modified Rankin (Stroke Patients Only)       Balance Overall balance assessment: Needs assistance;History of Falls Sitting-balance support: Feet supported;Bilateral upper extremity supported Sitting balance-Leahy Scale:  Poor Sitting balance - Comments: posterior lean, reliant upon BUE support                                    Cognition Arousal/Alertness: Lethargic Behavior During Therapy: WFL for tasks assessed/performed Overall Cognitive Status: Difficult to assess                                 General Comments: does not follow commands, able to state birthdate but not location, lethargic      Exercises General Exercises - Lower Extremity Ankle Circles/Pumps: PROM;Both;10 reps Short Arc Quad: PROM;Right;AAROM;10 reps;Supine Heel Slides: PROM;Right;10 reps;Supine Hip ABduction/ADduction: PROM;Right;10 reps;Supine    General Comments        Pertinent Vitals/Pain Faces Pain Scale: Hurts little more Pain Location: R LE with mobility Pain Descriptors / Indicators: Grimacing;Guarding Pain Intervention(s): Limited activity within patient's tolerance;Monitored during session;Repositioned    Home Living                      Prior Function            PT Goals (current goals can now be found in the care plan section) Acute Rehab PT Goals PT Goal Formulation: Patient unable to participate in goal setting Time For Goal Achievement: 2020-02-06 Potential  to Achieve Goals: Fair Progress towards PT goals: Progressing toward goals    Frequency    Min 2X/week      PT Plan      Co-evaluation              AM-PAC PT "6 Clicks" Mobility   Outcome Measure  Help needed turning from your back to your side while in a flat bed without using bedrails?: Total Help needed moving from lying on your back to sitting on the side of a flat bed without using bedrails?: Total Help needed moving to and from a bed to a chair (including a wheelchair)?: Total Help needed standing up from a chair using your arms (e.g., wheelchair or bedside chair)?: Total Help needed to walk in hospital room?: Total Help needed climbing 3-5 steps with a railing? : Total 6 Click  Score: 6    End of Session   Activity Tolerance: Patient limited by lethargy Patient left: in bed;with call bell/phone within reach;with bed alarm set Nurse Communication: Mobility status PT Visit Diagnosis: Pain;Muscle weakness (generalized) (M62.81);History of falling (Z91.81);Difficulty in walking, not elsewhere classified (R26.2) Pain - Right/Left: Right Pain - part of body: Hip     Time: 1030-1314 PT Time Calculation (min) (ACUTE ONLY): 17 min  Charges:  $Therapeutic Activity: 8-22 mins                     Blondell Reveal Kistler PT 01/15/2020  Acute Rehabilitation Services Pager [phone] Office [phone]

## 2020-01-15 NOTE — Progress Notes (Signed)
Modified Barium Swallow Progress Note  Patient Details  Name: Robert Richard MRN: [id number] Date of Birth: [birth year]  Today's Date: 01/15/2020  Modified Barium Swallow completed.  Full report located under Chart Review in the Imaging Section.  Brief recommendations include the following:  Clinical Impression  Moderate oral and mild pharyngeal dysphagia without overt aspiration.  Decreased oral coordination/transiting/strength noted with vertical mastication pattern.  Pt noted to have secretions retained in phayrnx which mixed with barium.  Minimal laryngeal penetration of thin liquids noted due to decreased laryngeal closure.  No coughing noted during testing however pt coughed immediately after testing with water intake.  Retention noted in vallecular space due to decreased tongue base retraction/epiglottic deflection.  Attempts at chin tuck were not successful.  Sensation of retention in phayrnx noted by pt only intermittently.  Recommend pt continue dys1/nectar diet at this time due to conserve energy, allowing tsps of thin.    Recommend pt's wife be allowed to provide soft whole foods in small amounts.  Follow up SLP to help strengthen pt's swallowing and pulmonary clearance.  Question if pt may have thrush, ? coating in oral cavity that did not clear with oral care/suctioning.  Will follow up.   Swallow Evaluation Recommendations       SLP Diet Recommendations: Dysphagia 1 (Puree) solids;Nectar thick liquid   Liquid Administration via: Straw;Cup   Medication Administration: Crushed with puree   Supervision: Full assist for feeding   Compensations: Slow rate;Small sips/bites;Other (Comment) (individual sips)       Oral Care Recommendations: Oral care QID   Other Recommendations: Order thickener from pharmacy    Macario Golds 01/15/2020,9:24 AM Kathleen Lime, MS [facility] SLP [place] Office [phone] Pager [phone]

## 2020-01-15 NOTE — Progress Notes (Signed)
PROGRESS NOTE    Robert Richard  PTW:[id number] DOB: 20-Aug-1930 DOA: 12/26/2019 PCP: Robert Rail, MD   Chief Complaint  Patient presents with  . Fall  . Leg Injury   Brief Narrative:  Patient is 86yo patient with CAD, hyperlipidemia, Robert Richard. fib on warfarin, GERD, CKD stage III, Parkinson's disease presented to ED after sustaining Robert Richard fall at home.  History per his wife, patient had gotten out of the shower and while he was in the bathroom, he fell and landed on the right side of his face, right hip.  Patient sustained facial laceration with scalp contusion.  He also complained of right hip pain with difficulty in being able to bear weight on the right leg due to pain.  EMS was called. In ED BP was soft-2/52, and leukocytosis with normocytic anemia.  Creatinine 1.4.  CT imaging showed no acute intracranial abnormality but showed right frontal/supraorbital swelling and laceration extending to the temporal soft tissues, mild right liver contusion and laceration.  No facial bone fracture. Hip x-ray showed right hip fracture Patient was transferred to [facility] for further work-up  Assessment & Plan:   Principal Problem:   Closed right hip fracture, initial encounter [facility]) Active Problems:   Hyperlipidemia   CKD (chronic kidney disease)   CAD (coronary artery disease)   Atrial fibrillation with RVR ([place])   Diabetes mellitus without complication (HCC)   GERD (gastroesophageal reflux disease)   Parkinson's disease ([place])   Facial laceration   Leukocytosis   Hyperglycemia   Fall at home, initial encounter  Goals of care: appreciate palliative care assistance.  Pt DNR after conversation with palliative care.  Will continue to monitor course.  Has continued delirium, aspiration, worsening resp status post op.  Will continue to address Robert Richard in the coming days.  Aspiration Pneumonia  Shortness of Breath CXR 11/23 with worsening airspace disease in LLL, concerning for pneumonia vs  aspiration 11/25 CXR with persistent L mid and L lower lung airspace disease, new airspace densities within the RU and RL lung CXR 11/26 with multifocal pneumonia, worsened in the R lung He's more tachypneic today, but no oxygen requirement - suspect continued aspiration Continue antibiotics -> zosyn  Blood cx NG SLP eval with mild/moderate aspiration risk -> dysphagia 1 diet, nectar thick liquid  Pseudomonas UTI Urine cx with pansensitive pseudomonas Continue zosyn  Acute Metabolic Encephalopathy: suspect 2/2 acute hospitalization, pain, recent surgery, aspiration pneumonia Repeat head CT 11/24 without acute intracranial abnormality Delirium precautions  Treat aspiration pneumonia and pseudomonas UTI He'd improved Robert Richard little yesterday from mental status standpoint, he continues to have lethargy improved from the first day I saw him, but not as good as yesterday.    Acute comminuted closed right hip fracture, after mechanical fall at home [facility]) -Right hip x-ray showed acute comminuted intertrochanteric fracture of the right hip with superior subluxation -Underwent intramedullary fixation of the right femur (Dr Robert Richard), on 70/01 -Complicated by anemia and Dwan Fennel. fib with RVR on 11/22, doing well today, will start PT - Continue warfarin for DVT ppx - therapy recommending SNF  Atrial fibrillation with RVR -Postop in atrial fibrillation with RVR on 11/22 requiring digoxin, IV Cardizem. Resumed oral Cardizem 240 mg daily -> transitioned to Q6 due to need to crush meds -Coumadin per pharmacy, cardiology now signed off  Acute on chronic blood loss anemia, postop -Hemoglobin 7.2 on 11/22, status post 2 units packed RBC transfusion -Relatively stable, labs with iron def anemia - PO iron  Right frontal/supraorbital swelling and laceration -Continue wound care, no active bleeding  Leukocytosis  Treated for aspiration pneumonia above  Diabetes mellitus type 2, diet  controlled -Hemoglobin A1c 6.5 08/09/2019 -Continue carb modified diet  CKD stageIIIa  NAGMA -Baseline creatinine ~1.5 -Avoid nephrotoxic medications, hypotension - NAGMA, will continue to monitor, likely 2/2 CKD and IVF -continue to monitor  CAD, hyperlipidemia Continue Crestor  GERD -Continue Protonix   Parkinson's disease Continue Sinemet  Underweight, protein calorie malnutrition Estimated body mass index is 18.62 kg/m as calculated from the following:   Height as of this encounter: 6\' 2"  (1.88 m).   Weight as of this encounter: 65.8 kg.  Nutritional supplements  DVT prophylaxis: warfarin Code Status: DNR Family Communication:  wife at bedside Disposition:   Status is: Inpatient  Remains inpatient appropriate because:Inpatient level of care appropriate due to severity of illness   Dispo: The patient is from: Home              Anticipated d/c is to: SNF              Anticipated d/c date is: > 3 days              Patient currently is not medically stable to d/c.       Consultants:   Palliative care  Cardiology  ortho  Procedures:  intramedullary fixation of the right femur (Dr Robert Richard), on 11/21  Antimicrobials:  Anti-infectives (From admission, onward)   Start     Dose/Rate Route Frequency Ordered Stop   01/14/20 0830  piperacillin-tazobactam (ZOSYN) IVPB 3.375 g        3.375 g 12.5 mL/hr over 240 Minutes Intravenous Every 8 hours 01/14/20 0806     01/12/20 2100  vancomycin (VANCOCIN) IVPB 1000 mg/200 mL premix        1,000 mg 200 mL/hr over 60 Minutes Intravenous  Once 01/12/20 2001 01/13/20 0548   01/12/20 2030  cefTRIAXone (ROCEPHIN) 1 g in sodium chloride 0.9 % 100 mL IVPB  Status:  Discontinued        1 g 200 mL/hr over 30 Minutes Intravenous Every 24 hours 01/12/20 1930 01/14/20 0756   01/09/2020 1500  ceFAZolin (ANCEF) IVPB 2g/100 mL premix        2 g 200 mL/hr over 30 Minutes Intravenous Every 6 hours 12/22/2019 1113 01/11/2020 2141      Subjective: Denies pain Disoriented   Objective: Vitals:   01/15/20 0300 01/15/20 0554 01/15/20 1406 01/15/20 1416  BP:  113/64 (!) 135/59   Pulse:  93 78   Resp: 20 (!) 21 20 (!) 29  Temp:  98.5 F (36.9 C) 98.4 F (36.9 C)   TempSrc:  Oral Oral   SpO2:  94% 91% 92%  Weight:      Height:        Intake/Output Summary (Last 24 hours) at 01/15/2020 1542 Last data filed at 01/15/2020 1430 Gross per 24 hour  Intake 2121.29 ml  Output 1250 ml  Net 871.29 ml   Filed Weights   01/03/2020 2309 01/09/2020 2312  Weight: 61.2 kg 65.8 kg    Examination:  General: No acute distress.  Less lethargic than 11/24, but more than 11/25. Cardiovascular: Heart sounds show Davena Julian regular rate, and rhythm.  Lungs: Scattered crackles on R.  Tachypneic. Audible respirations. Abdomen: Soft, nontender, nondistended  Neurological: Alert and oriented 3. Moves all extremities 4. Cranial nerves II through XII grossly intact. Skin: Warm and dry. No rashes or lesions. Extremities:  Dressing intact to RLE.   Data Reviewed: I have personally reviewed following labs and imaging studies  CBC: Recent Labs  Lab 12/30/2019 0053 01/12/2020 0519 01/11/20 0500 01/11/20 0500 01/11/20 2226 01/12/20 0440 01/13/20 0515 01/14/20 0416 01/15/20 0450  WBC 16.4*   < > 14.5*  --   --  14.5* 12.6* 12.1* 11.8*  NEUTROABS 13.9*  --   --   --   --   --   --  9.6*  --   HGB 9.6*   < > 7.2*   < > 8.1* 8.1* 7.4* 7.3* 7.4*  HCT 30.5*   < > 22.5*   < > 24.2* 23.8* 22.4* 22.8* 23.0*  MCV 95.0   < > 93.4  --   --  89.1 91.1 93.1 93.9  PLT 194   < > 146*  --   --  99* 117* 152 165   < > = values in this interval not displayed.    Basic Metabolic Panel: Recent Labs  Lab 01/12/2020 0519 01/09/2020 0519 01/11/20 0500 01/12/20 0440 01/13/20 1237 01/14/20 0416 01/15/20 0450  NA 140   < > 137 135 134* 136 134*  K 4.7   < > 4.5 4.0 3.9 3.7 3.7  CL 110   < > 107 106 105 107 108  CO2 19*   < > 20* 18* 20* 20* 17*   GLUCOSE 139*   < > 157* 127* 117* 112* 137*  BUN 28*   < > 32* 28* 28* 26* 26*  CREATININE 1.59*   < > 1.62* 1.26* 1.35* 1.29* 1.27*  CALCIUM 8.9   < > 8.3* 8.2* 8.0* 8.0* 7.8*  MG 2.0  --   --   --   --  2.1 2.0  PHOS 3.6  --   --   --   --  3.1 2.5   < > = values in this interval not displayed.    GFR: Estimated Creatinine Clearance: 38.1 mL/min (Teofil Maniaci) (by C-G formula based on SCr of 1.27 mg/dL (H)).  Liver Function Tests: Recent Labs  Lab 01/15/2020 0053 12/30/2019 0519 01/13/20 1237 01/14/20 0416 01/15/20 0450  AST 20 22 37 36 40  ALT 13 5 9 19 12   ALKPHOS 82 75 64 70 94  BILITOT 0.6 0.9 1.0 1.0 1.2  PROT 7.3 7.1 5.9* 6.1* 6.0*  ALBUMIN 3.5 3.5 2.7* 2.6* 2.4*    CBG: Recent Labs  Lab 01/11/20 2110  GLUCAP 178*     Recent Results (from the past 240 hour(s))  Resp Panel by RT-PCR (Flu Joselynne Killam&B, Covid) Nasopharyngeal Swab     Status: None   Collection Time: 01/07/2020 11:50 PM   Specimen: Nasopharyngeal Swab; Nasopharyngeal(NP) swabs in vial transport medium  Result Value Ref Range Status   SARS Coronavirus 2 by RT PCR NEGATIVE NEGATIVE Final    Comment: (NOTE) SARS-CoV-2 target nucleic acids are NOT DETECTED.  The SARS-CoV-2 RNA is generally detectable in upper respiratory specimens during the acute phase of infection. The lowest concentration of SARS-CoV-2 viral copies this assay can detect is 138 copies/mL. Dace Denn negative result does not preclude SARS-Cov-2 infection and should not be used as the sole basis for treatment or other patient management decisions. Kahliyah Dick negative result may occur with  improper specimen collection/handling, submission of specimen other than nasopharyngeal swab, presence of viral mutation(s) within the areas targeted by this assay, and inadequate number of viral copies(<138 copies/mL). Thelmer Legler negative result must be combined with clinical observations, patient history, and  epidemiological information. The expected result is Negative.  Fact Sheet for  Patients:  EntrepreneurPulse.com.au  Fact Sheet for Healthcare Providers:  IncredibleEmployment.be  This test is no t yet approved or cleared by the [place] FDA and  has been authorized for detection and/or diagnosis of SARS-CoV-2 by FDA under an Emergency Use Authorization (EUA). This EUA will remain  in effect (meaning this test can be used) for the duration of the COVID-19 declaration under Section 564(b)(1) of the Act, 21 U.S.C.section 360bbb-3(b)(1), unless the authorization is terminated  or revoked sooner.       Influenza Massiel Stipp by PCR NEGATIVE NEGATIVE Final   Influenza B by PCR NEGATIVE NEGATIVE Final    Comment: (NOTE) The Xpert Xpress SARS-CoV-2/FLU/RSV plus assay is intended as an aid in the diagnosis of influenza from Nasopharyngeal swab specimens and should not be used as Maisha Bogen sole basis for treatment. Nasal washings and aspirates are unacceptable for Xpert Xpress SARS-CoV-2/FLU/RSV testing.  Fact Sheet for Patients: EntrepreneurPulse.com.au  Fact Sheet for Healthcare Providers: IncredibleEmployment.be  This test is not yet approved or cleared by the [place] FDA and has been authorized for detection and/or diagnosis of SARS-CoV-2 by FDA under an Emergency Use Authorization (EUA). This EUA will remain in effect (meaning this test can be used) for the duration of the COVID-19 declaration under Section 564(b)(1) of the Act, 21 U.S.C. section 360bbb-3(b)(1), unless the authorization is terminated or revoked.  Performed at [facility], [address], [place], [place] [address]   Surgical pcr screen     Status: None   Collection Time: 12/27/2019  9:02 PM   Specimen: Nasal Mucosa; Nasal Swab  Result Value Ref Range Status   MRSA, PCR NEGATIVE NEGATIVE Final   Staphylococcus aureus NEGATIVE NEGATIVE Final    Comment: (NOTE) The Xpert SA Assay (FDA approved for NASAL specimens in patients  79 years of age and older), is one component of Alezandra Egli comprehensive surveillance program. It is not intended to diagnose infection nor to guide or monitor treatment. Performed at [facility], [place] [address], [place], [place] [address]   Culture, blood (routine x 2)     Status: None (Preliminary result)   Collection Time: 01/12/20  7:21 PM   Specimen: BLOOD  Result Value Ref Range Status   Specimen Description   Final    BLOOD RIGHT ANTECUBITAL Performed at [facility] [address], [place], [place] [address]    Special Requests   Final    BOTTLES DRAWN AEROBIC AND ANAEROBIC Blood Culture adequate volume Performed at [place] [address], [place], [place] [address]    Culture   Final    NO GROWTH 3 DAYS Performed at [facility], [place] [address] [place], [place] [address]    Report Status PENDING  Incomplete  Culture, blood (routine x 2)     Status: None (Preliminary result)   Collection Time: 01/12/20  7:22 PM   Specimen: BLOOD  Result Value Ref Range Status   Specimen Description   Final    BLOOD LEFT ARM Performed at [place] [facility] [address], [place], [place] [address]    Special Requests   Final    BOTTLES DRAWN AEROBIC ONLY Blood Culture adequate volume Performed at [place] [address] [place], [place] [address]    Culture   Final    NO GROWTH 3 DAYS Performed at [facility], [place]  [address], [place], [place] [address]    Report Status PENDING  Incomplete  Culture, Urine     Status: Abnormal   Collection Time: 01/13/20  1:20 AM   Specimen: Urine, Random  Result Value Ref Range Status   Specimen Description   Final    URINE, RANDOM Performed at [facility], [place] [address] [place], [place] [address]    Special Requests   Final    NONE Performed at [facility], [place] [address] [place], [place]  [address]    Culture 50,000 COLONIES/mL PSEUDOMONAS AERUGINOSA (Hussien Greenblatt)  Final   Report Status 01/15/2020 FINAL  Final   Organism ID, Bacteria PSEUDOMONAS AERUGINOSA (Maddi Collar)  Final      Susceptibility   Pseudomonas aeruginosa - MIC*    CEFTAZIDIME 2 SENSITIVE Sensitive     CIPROFLOXACIN 0.5 SENSITIVE Sensitive     GENTAMICIN <=1 SENSITIVE Sensitive     IMIPENEM 2 SENSITIVE Sensitive     PIP/TAZO <=4 SENSITIVE Sensitive     CEFEPIME 2 SENSITIVE Sensitive     * 50,000 COLONIES/mL PSEUDOMONAS AERUGINOSA         Radiology Studies: CT HEAD WO CONTRAST  Result Date: 01/13/2020 CLINICAL DATA:  Altered mental status. EXAM: CT HEAD WITHOUT CONTRAST TECHNIQUE: Contiguous axial images were obtained from the base of the skull through the vertex without intravenous contrast. COMPARISON:  January 09, 2020 FINDINGS: Brain: There is mild to moderate severity cerebral atrophy with widening of the extra-axial spaces and ventricular dilatation. There are areas of decreased attenuation within the white matter tracts of the supratentorial brain, consistent with microvascular disease changes. Vascular: No hyperdense vessel or unexpected calcification. Skull: Normal. Negative for fracture or focal lesion. Sinuses/Orbits: No acute finding. Other: Mild right frontal and right frontoparietal scalp soft tissue swelling is seen. IMPRESSION: 1. Mild right frontal and right frontoparietal scalp soft tissue swelling without evidence of an acute fracture or acute intracranial abnormality. 2. Cerebral atrophy and microvascular disease changes of the supratentorial brain. Electronically Signed   By: Virgina Norfolk M.D.   On: 01/13/2020 19:55   DG CHEST PORT 1 VIEW  Result Date: 01/15/2020 CLINICAL DATA:  Shortness of breath. EXAM: PORTABLE CHEST 1 VIEW COMPARISON:  Chest x-ray from yesterday. FINDINGS: Stable cardiomediastinal silhouette status post CABG and aortic valve replacement. Worsening hazy airspace disease in the right  upper and lower lobes. Unchanged hazy airspace disease in the left mid lung and left lower lobe consolidation. No pneumothorax or large pleural effusion. No acute osseous abnormality. IMPRESSION: 1. Multifocal pneumonia, worsened in the right lung. Electronically Signed   By: Titus Dubin M.D.   On: 01/15/2020 12:29   DG CHEST PORT 1 VIEW  Result Date: 01/14/2020 CLINICAL DATA:  Evaluate pneumonia EXAM: PORTABLE CHEST 1 VIEW COMPARISON:  01/12/2020 FINDINGS: Previous median sternotomy, aortic valve replacement and CABG. Aortic atherosclerotic calcifications identified. Airspace disease within the left midlung and left lower lung are unchanged in the interval. New, developing opacity within the right lower lung and right upper lobe noted. IMPRESSION: 1. Persistent left mid and left lower lung airspace disease. 2. New airspace densities within the right upper and right lower lung. Electronically Signed   By: Kerby Moors M.D.   On: 01/14/2020 07:31   DG Swallowing Func-Speech Pathology  Result Date: 01/15/2020 Objective Swallowing Evaluation: Type of Study: MBS-Modified Barium Swallow Study  Patient Details Name: TASHAN KREITZER MRN: [id number] Date of Birth: 1930-05-28 Today's Date: 01/15/2020 Time: SLP Start Time (ACUTE ONLY):  0835 -SLP Stop Time (ACUTE ONLY): 0901 SLP Time Calculation (min) (ACUTE ONLY): 26 min Past Medical History: Past Medical History: Diagnosis Date . Allergic rhinitis  . Anemia  . Arthritis   SHOULDER . Asthma with bronchitis  . Colon polyp  . COPD (chronic obstructive pulmonary disease) (HCC)   Astmatic bronchitis . Dysrhythmia 08/2012  NEW ONSET ATRIAL FIBRILATION . Gout   PMH . Headache(784.0)  . HLD (hyperlipidemia)  . Nephrolithiasis  . Osteoporosis  . RLS (restless legs syndrome)  . Skin cancer (melanoma) ([place]) 2012  Right neck . Skin cancer, basal cell   Dr [place], [facility] Past Surgical History: Past Surgical History: Procedure Laterality Date . AORTIC VALVE REPLACEMENT N/Salima Rumer 02/20/2013   Procedure: AORTIC VALVE REPLACEMENT (AVR);  Surgeon: Ivin Poot, MD;  Location: [facility];  Service: Open Heart Surgery;  Laterality: N/Rhyland Hinderliter; . COLONOSCOPY W/ POLYPECTOMY    Dr Deatra Ina . CORONARY ARTERY BYPASS GRAFT N/Chi Woodham 02/20/2013  Procedure: CORONARY ARTERY BYPASS GRAFTING (CABG);  Surgeon: Ivin Poot, MD;  Location: [facility];  Service: Open Heart Surgery;  Laterality: N/Eyad Rochford; . FINGER SURGERY    for foreign body . INGUINAL HERNIA REPAIR   . INTRAMEDULLARY (IM) NAIL INTERTROCHANTERIC Right 01/11/2020  Procedure: INTRAMEDULLARY (IM) NAIL INTERTROCHANTRIC;  Surgeon: Rod Can, MD;  Location: [facility];  Service: Orthopedics;  Laterality: Right; . INTRAOPERATIVE TRANSESOPHAGEAL ECHOCARDIOGRAM N/Kamauri Denardo 02/20/2013  Procedure: INTRAOPERATIVE TRANSESOPHAGEAL ECHOCARDIOGRAM;  Surgeon: Ivin Poot, MD;  Location: [facility];  Service: Open Heart Surgery;  Laterality: N/Amogh Komatsu; . LEFT AND RIGHT HEART CATHETERIZATION WITH CORONARY ANGIOGRAM N/Timm Bonenberger 01/05/2013  Procedure: LEFT AND RIGHT HEART CATHETERIZATION WITH CORONARY ANGIOGRAM;  Surgeon: Blane Ohara, MD;  Location: [facility] C[facility] LAB;  Service: Cardiovascular;  Laterality: N/Ahmari Duerson; . LEG SURGERY  06/2010   Dr.Lawson, for blood clott. Left leg  . NECK SURGERY  12/2010  Melanoma ; [facility]  . NOSE SURGERY    Septal Deviation . RIGHT HEART CATHETERIZATION N/Ind[place]ia Mckeough 01/23/2013  Procedure: RIGHT HEART CATH;  Surgeon: Jolaine Artist, MD;  Location: [facility] CATH LAB;  Service: Cardiovascular;  Laterality: N/Kaydance Bowie; . TEE WITHOUT CARDIOVERSION N/Arlone Lenhardt 01/23/2013  Procedure: TRANSESOPHAGEAL ECHOCARDIOGRAM (TEE);  Surgeon: Jolaine Artist, MD;  Location: [facility] ENDOSCOPY;  Service: Cardiovascular;  Laterality: N/Myndi Wamble;  sign heart cath consent w/tee consent HPI: Pt is an 86 y.o. male with medical history significant for hyperlipidemia, CAD, Dashanique Brownstein. fib on warfarin, GERD, CKD stage III, Parkinson's disease who presented to the ED via EMS after sustaining Emmalia Heyboer fall at home. CXR 11/23: Worsening airspace disease in the left lower  lung, suspect for pneumonia or potentially aspiration. Cardiomegaly. Pt was most recently seen by SLP on 03/17/13 and Aeric Burnham dysphagia 2 diet with thin liquids was recommended at that time per pt's request d/t thrush. MBS 02/26/13: Pt. exhibited mild oral dysphagia with delayed transit exacerbated by xerostomia and decreased endurance.  Mild-mod sensory pharyngeal dysphagia with reduced tongue base retraction and laryngeal elevation resulting in trace vallecular/pyriform sinus/pharyngeal wall residue.    Concerns for aspiration pna present.  Subjective: pt asleep in bed - aroused however in his room prior to transport Assessment / Plan / Recommendation CHL IP CLINICAL IMPRESSIONS 01/15/2020 Clinical Impression Moderate oral and mild pharyngeal dysphagia without overt aspiration.  Decreased oral coordination/transiting/strength noted with vertical mastication pattern.  Pt noted to have secretions retained in phayrnx which mixed with barium.  Minimal laryngeal penetration of thin liquids noted due to decreased laryngeal closure.  No coughing noted during testing however pt coughed immediately after  testing with water intake.  Retention noted in vallecular space due to decreased tongue base retraction/epiglottic deflection.  Attempts at chin tuck were not successful.  Sensation of retention in phayrnx noted by pt only intermittently.  Recommend pt continue dys1/nectar diet at this time due to conserve energy, allowing tsps of thin.  Recommend pt's wife be allowed to provide soft whole foods in small amounts.  Follow up SLP to help strengthen pt's swallowing and pulmonary clearance.  Question if pt may have thrush, ? coating in oral cavity that did not clear with oral care/suctioning.  Will follow up. SLP Visit Diagnosis Dysphagia, unspecified (R13.10) Attention and concentration deficit following -- Frontal lobe and executive function deficit following -- Impact on safety and function Mild aspiration risk;Moderate aspiration  risk   CHL IP TREATMENT RECOMMENDATION 01/15/2020 Treatment Recommendations Therapy as outlined in treatment plan below   Prognosis 01/15/2020 Prognosis for Safe Diet Advancement -- Barriers to Reach Goals Cognitive deficits Barriers/Prognosis Comment -- CHL IP DIET RECOMMENDATION 01/15/2020 SLP Diet Recommendations Dysphagia 1 (Puree) solids;Nectar thick liquid Liquid Administration via Straw;Cup Medication Administration Crushed with puree Compensations Slow rate;Small sips/bites;Other (Comment) Postural Changes --   CHL IP OTHER RECOMMENDATIONS 01/15/2020 Recommended Consults -- Oral Care Recommendations Oral care QID Other Recommendations Order thickener from pharmacy   CHL IP FOLLOW UP RECOMMENDATIONS 01/15/2020 Follow up Recommendations Skilled Nursing facility   [facility] IP FREQUENCY AND DURATION 01/15/2020 Speech Therapy Frequency (ACUTE ONLY) min 2x/week Treatment Duration 2 weeks      CHL IP ORAL PHASE 01/15/2020 Oral Phase Impaired Oral - Pudding Teaspoon -- Oral - Pudding Cup -- Oral - Honey Teaspoon -- Oral - Honey Cup -- Oral - Nectar Teaspoon Decreased bolus cohesion;Weak lingual manipulation Oral - Nectar Cup -- Oral - Nectar Straw Decreased bolus cohesion;Weak lingual manipulation Oral - Thin Teaspoon Decreased bolus cohesion;Weak lingual manipulation;Reduced posterior propulsion Oral - Thin Cup Decreased bolus cohesion;Weak lingual manipulation;Reduced posterior propulsion Oral - Thin Straw Decreased bolus cohesion;Weak lingual manipulation;Reduced posterior propulsion Oral - Puree Decreased bolus cohesion;Weak lingual manipulation;Reduced posterior propulsion Oral - Mech Soft Impaired mastication;Weak lingual manipulation;Decreased bolus cohesion;Reduced posterior propulsion;Lingual/palatal residue;Premature spillage Oral - Regular -- Oral - Multi-Consistency -- Oral - Pill -- Oral Phase - Comment --  CHL IP PHARYNGEAL PHASE 01/15/2020 Pharyngeal Phase Impaired Pharyngeal- Pudding Teaspoon --  Pharyngeal -- Pharyngeal- Pudding Cup -- Pharyngeal -- Pharyngeal- Honey Teaspoon -- Pharyngeal -- Pharyngeal- Honey Cup -- Pharyngeal -- Pharyngeal- Nectar Teaspoon -- Pharyngeal -- Pharyngeal- Nectar Cup Pharyngeal residue - valleculae Pharyngeal Material does not enter airway Pharyngeal- Nectar Straw Reduced epiglottic inversion;Pharyngeal residue - valleculae Pharyngeal Material does not enter airway Pharyngeal- Thin Teaspoon Delayed swallow initiation-pyriform sinuses;Reduced tongue base retraction;Pharyngeal residue - valleculae Pharyngeal -- Pharyngeal- Thin Cup Delayed swallow initiation-pyriform sinuses;Penetration/Aspiration during swallow;Pharyngeal residue - valleculae Pharyngeal Material enters airway, CONTACTS cords and then ejected out Pharyngeal- Thin Straw Reduced tongue base retraction;Pharyngeal residue - valleculae;Penetration/Aspiration during swallow Pharyngeal Material enters airway, CONTACTS cords and then ejected out Pharyngeal- Puree Pharyngeal residue - valleculae;Reduced tongue base retraction Pharyngeal Material does not enter airway Pharyngeal- Mechanical Soft Delayed swallow initiation-vallecula;Reduced tongue base retraction;Pharyngeal residue - valleculae Pharyngeal Material does not enter airway Pharyngeal- Regular -- Pharyngeal -- Pharyngeal- Multi-consistency -- Pharyngeal -- Pharyngeal- Pill -- Pharyngeal -- Pharyngeal Comment --  CHL IP CERVICAL ESOPHAGEAL PHASE 01/15/2020 Cervical Esophageal Phase Impaired Pudding Teaspoon -- Pudding Cup -- Honey Teaspoon -- Honey Cup -- Nectar Teaspoon -- Nectar Cup -- Nectar Straw -- Thin Teaspoon -- Thin Cup --  Thin Straw -- Puree -- Mechanical Soft -- Regular -- Multi-consistency -- Pill -- Cervical Esophageal Comment prominent cricopharyngeus did not impair barium flow Kathleen Lime, MS [facility] SLP Acute Rehab Services Office [phone] Pager [phone] Macario Golds 01/15/2020, 9:26 AM                   Scheduled Meds: . sodium  chloride   Intravenous Once  . carbidopa-levodopa  1 tablet Oral TID  . diltiazem  60 mg Oral Q6H  . docusate  100 mg Oral BID  . feeding supplement  237 mL Oral BID BM  . ferrous sulfate  325 mg Oral Q breakfast  . gabapentin  100 mg Oral QHS  . ketotifen  1 drop Both Eyes Daily  . multivitamin with minerals   Oral Daily  . [START ON 01/16/2020] pantoprazole sodium  40 mg Oral Daily  . rosuvastatin  2.5 mg Oral QODAY  . topiramate  75 mg Oral Daily  . Warfarin - Pharmacist Dosing Inpatient   Does not apply q1600   Continuous Infusions: . piperacillin-tazobactam (ZOSYN)  IV 3.375 g (01/15/20 1331)     LOS: 6 days    Time spent: over 30 min    Fayrene Helper, MD [facility]   To contact the attending provider between 7A-7P or the covering provider during after hours 7P-7A, please log into the web site [email or web address] and access using universal [place] password for that web site. If you do not have the password, please call the hospital operator.  01/15/2020, 3:42 PM

## 2020-01-15 NOTE — Plan of Care (Signed)
°  Problem: Health Behavior/Discharge Planning: Goal: Ability to manage health-related needs will improve Outcome: Progressing   Problem: Clinical Measurements: Goal: Will remain free from infection Outcome: Progressing Goal: Respiratory complications will improve Outcome: Progressing Goal: Cardiovascular complication will be avoided Outcome: Progressing   Problem: Activity: Goal: Risk for activity intolerance will decrease Outcome: Progressing   Problem: Coping: Goal: Level of anxiety will decrease Outcome: Progressing   Problem: Pain Managment: Goal: General experience of comfort will improve Outcome: Progressing   Problem: Safety: Goal: Ability to remain free from injury will improve Outcome: Progressing   Problem: Skin Integrity: Goal: Risk for impaired skin integrity will decrease Outcome: Progressing

## 2020-01-16 DIAGNOSIS — S72001A Fracture of unspecified part of neck of right femur, initial encounter for closed fracture: Secondary | ICD-10-CM | POA: Diagnosis not present

## 2020-01-16 LAB — CBC WITH DIFFERENTIAL/PLATELET
Abs Immature Granulocytes: 0.1 10*3/uL — ABNORMAL HIGH (ref 0.00–0.07)
Basophils Absolute: 0 10*3/uL (ref 0.0–0.1)
Basophils Relative: 0 %
Eosinophils Absolute: 0.2 10*3/uL (ref 0.0–0.5)
Eosinophils Relative: 1 %
HCT: 23.2 % — ABNORMAL LOW (ref 39.0–52.0)
Hemoglobin: 7.4 g/dL — ABNORMAL LOW (ref 13.0–17.0)
Immature Granulocytes: 1 %
Lymphocytes Relative: 7 %
Lymphs Abs: 1 10*3/uL (ref 0.7–4.0)
MCH: 29.6 pg (ref 26.0–34.0)
MCHC: 31.9 g/dL (ref 30.0–36.0)
MCV: 92.8 fL (ref 80.0–100.0)
Monocytes Absolute: 1.7 10*3/uL — ABNORMAL HIGH (ref 0.1–1.0)
Monocytes Relative: 13 %
Neutro Abs: 10.8 10*3/uL — ABNORMAL HIGH (ref 1.7–7.7)
Neutrophils Relative %: 78 %
Platelets: 197 10*3/uL (ref 150–400)
RBC: 2.5 MIL/uL — ABNORMAL LOW (ref 4.22–5.81)
RDW: 14.1 % (ref 11.5–15.5)
WBC: 13.8 10*3/uL — ABNORMAL HIGH (ref 4.0–10.5)
nRBC: 0 % (ref 0.0–0.2)

## 2020-01-16 LAB — COMPREHENSIVE METABOLIC PANEL
ALT: 12 U/L (ref 0–44)
AST: 44 U/L — ABNORMAL HIGH (ref 15–41)
Albumin: 2.5 g/dL — ABNORMAL LOW (ref 3.5–5.0)
Alkaline Phosphatase: 113 U/L (ref 38–126)
Anion gap: 10 (ref 5–15)
BUN: 27 mg/dL — ABNORMAL HIGH (ref 8–23)
CO2: 17 mmol/L — ABNORMAL LOW (ref 22–32)
Calcium: 7.8 mg/dL — ABNORMAL LOW (ref 8.9–10.3)
Chloride: 111 mmol/L (ref 98–111)
Creatinine, Ser: 1.27 mg/dL — ABNORMAL HIGH (ref 0.61–1.24)
GFR, Estimated: 55 mL/min — ABNORMAL LOW (ref >60)
Glucose, Bld: 127 mg/dL — ABNORMAL HIGH (ref 70–99)
Potassium: 3.9 mmol/L (ref 3.5–5.1)
Sodium: 138 mmol/L (ref 135–145)
Total Bilirubin: 1.4 mg/dL — ABNORMAL HIGH (ref 0.3–1.2)
Total Protein: 6.3 g/dL — ABNORMAL LOW (ref 6.5–8.1)

## 2020-01-16 LAB — PHOSPHORUS: Phosphorus: 2.7 mg/dL (ref 2.5–4.6)

## 2020-01-16 LAB — PROTIME-INR
INR: 2.8 — ABNORMAL HIGH (ref 0.8–1.2)
Prothrombin Time: 28.3 seconds — ABNORMAL HIGH (ref 11.4–15.2)

## 2020-01-16 LAB — MAGNESIUM: Magnesium: 2.2 mg/dL (ref 1.7–2.4)

## 2020-01-16 MED ORDER — STARCH (THICKENING) PO POWD
ORAL | Status: DC | PRN
  Filled 2020-01-16: qty 227

## 2020-01-16 MED ORDER — WARFARIN 0.5 MG HALF TABLET
0.5000 mg | ORAL_TABLET | Freq: Once | ORAL | Status: AC
  Administered 2020-01-16: 0.5 mg via ORAL
  Filled 2020-01-16: qty 1

## 2020-01-16 NOTE — Progress Notes (Signed)
PROGRESS NOTE    Robert Richard  QPY:[id number] DOB: September 13, 1930 DOA: 12/23/2019 PCP: [name], MD   Chief Complaint  Patient presents with  . Fall  . Leg Injury   Brief Narrative:  Patient is 86yo patient with CAD, hyperlipidemia, Robert Richard. fib on warfarin, GERD, CKD stage III, Parkinson's disease presented to ED after sustaining Robert Richard fall at home.  History per his wife, patient had gotten out of the shower and while he was in the bathroom, he fell and landed on the right side of his face, right hip.  Patient sustained facial laceration with scalp contusion.  He also complained of right hip pain with difficulty in being able to bear weight on the right leg due to pain.  EMS was called. In ED BP was soft-2/52, and leukocytosis with normocytic anemia.  Creatinine 1.4.  CT imaging showed no acute intracranial abnormality but showed right frontal/supraorbital swelling and laceration extending to the temporal soft tissues, mild right liver contusion and laceration.  No facial bone fracture. Hip x-ray showed right hip fracture Patient was transferred to [facility] for further work-up  Assessment & Plan:   Principal Problem:   Closed right hip fracture, initial encounter [facility]) Active Problems:   Hyperlipidemia   CKD (chronic kidney disease)   CAD (coronary artery disease)   Atrial fibrillation with RVR ([place])   Diabetes mellitus without complication (HCC)   GERD (gastroesophageal reflux disease)   Parkinson's disease ([place])   Facial laceration   Leukocytosis   Hyperglycemia   Fall at home, initial encounter  Goals of care: appreciate palliative care assistance.  Pt DNR after conversation with palliative care.  Will continue to monitor course.  Has continued delirium, aspiration, worsening resp status post op.  Will continue to address [place] in the coming days.  Aspiration Pneumonia  Shortness of Breath CXR 11/23 with worsening airspace disease in LLL, concerning for pneumonia vs  aspiration 11/25 CXR with persistent L mid and L lower lung airspace disease, new airspace densities within the RU and RL lung CXR 11/26 with multifocal pneumonia, worsened in the R lung Continues on RA, tachypneic.  Elevated temps x2 overnight, no true fever over 100.4.  Mildly worse leukocytosis.  Will continue to monitor on zosyn.   Continue antibiotics -> zosyn  Blood cx NG SLP eval with mild/moderate aspiration risk -> dysphagia 1 diet, nectar thick liquid  Pseudomonas UTI Urine cx with pansensitive pseudomonas Continue zosyn  Acute Metabolic Encephalopathy: suspect 2/2 acute hospitalization, pain, recent surgery, aspiration pneumonia Repeat head CT 11/24 without acute intracranial abnormality Delirium precautions  Treat aspiration pneumonia and pseudomonas UTI Seems improved, continue to monitor  Acute comminuted closed right hip fracture, after mechanical fall at home [facility]) -Right hip x-ray showed acute comminuted intertrochanteric fracture of the right hip with superior subluxation -Underwent intramedullary fixation of the right femur (Dr Robert Richard), on 12/45 -Complicated by anemia and Robert Richard. fib with RVR on 11/22, doing well today, will start PT - Continue warfarin for DVT ppx - therapy recommending SNF  Atrial fibrillation with RVR -Postop in atrial fibrillation with RVR on 11/22 requiring digoxin, IV Cardizem. Resumed oral Cardizem 240 mg daily -> transitioned to Q6 due to need to crush meds -Coumadin per pharmacy, cardiology now signed off  Acute on chronic blood loss anemia, postop -Hemoglobin 7.2 on 11/22, status post 2 units packed RBC transfusion -Relatively stable, labs with iron def anemia - PO iron   Right frontal/supraorbital swelling and laceration -Continue wound care, no  active bleeding  Leukocytosis  Treated for aspiration pneumonia above  Diabetes mellitus type 2, diet controlled -Hemoglobin A1c 6.5 08/09/2019 -Continue carb modified diet  CKD  stageIIIa  NAGMA -Baseline creatinine ~1.5 -Avoid nephrotoxic medications, hypotension - NAGMA, will continue to monitor, likely 2/2 CKD and IVF - holding topamax for now -continue to monitor  CAD, hyperlipidemia Continue Crestor  GERD -Continue Protonix   Parkinson's disease Continue Sinemet  Underweight, protein calorie malnutrition Estimated body mass index is 18.62 kg/m as calculated from the following:   Height as of this encounter: 6\' 2"  (1.88 m).   Weight as of this encounter: 65.8 kg.  Nutritional supplements  DVT prophylaxis: warfarin Code Status: DNR Family Communication:  wife at bedside Disposition:   Status is: Inpatient  Remains inpatient appropriate because:Inpatient level of care appropriate due to severity of illness   Dispo: The patient is from: Home              Anticipated d/c is to: SNF              Anticipated d/c date is: > 3 days              Patient currently is not medically stable to d/c.       Consultants:   Palliative care  Cardiology  ortho  Procedures:  intramedullary fixation of the right femur (Dr Robert Richard), on 11/21  Antimicrobials:  Anti-infectives (From admission, onward)   Start     Dose/Rate Route Frequency Ordered Stop   01/14/20 0830  piperacillin-tazobactam (ZOSYN) IVPB 3.375 g        3.375 g 12.5 mL/hr over 240 Minutes Intravenous Every 8 hours 01/14/20 0806     01/12/20 2100  vancomycin (VANCOCIN) IVPB 1000 mg/200 mL premix        1,000 mg 200 mL/hr over 60 Minutes Intravenous  Once 01/12/20 2001 01/13/20 0548   01/12/20 2030  cefTRIAXone (ROCEPHIN) 1 g in sodium chloride 0.9 % 100 mL IVPB  Status:  Discontinued        1 g 200 mL/hr over 30 Minutes Intravenous Every 24 hours 01/12/20 1930 01/14/20 0756   01/19/2020 1500  ceFAZolin (ANCEF) IVPB 2g/100 mL premix        2 g 200 mL/hr over 30 Minutes Intravenous Every 6 hours 12/24/2019 1113 12/26/2019 2141     Subjective: Confused, no new complaints, more  alert than yesterday Denies pain  Objective: Vitals:   01/16/20 0445 01/16/20 1004 01/16/20 1127 01/16/20 1256  BP: 127/60 (!) 119/51  (!) 127/56  Pulse: 81 78 81 (!) 102  Resp: (!) 28 (!) 28    Temp: 98.7 F (37.1 C) (!) 97.5 F (36.4 C)    TempSrc: Oral Oral    SpO2: 93% 93% 94% 93%  Weight:      Height:        Intake/Output Summary (Last 24 hours) at 01/16/2020 1431 Last data filed at 01/16/2020 0900 Gross per 24 hour  Intake 360 ml  Output 1250 ml  Net -890 ml   Filed Weights   01/02/2020 2309 01/17/2020 2312  Weight: 61.2 kg 65.8 kg    Examination:  General: No acute distress. Cardiovascular: RRR Lungs: tachypneic Abdomen: Soft, nontender, nondistended Neurological: Alert and oriented 3. Moves all extremities 4. Cranial nerves II through XII grossly intact. Skin: Warm and dry. No rashes or lesions. Extremities: RLE with intact dressing    Data Reviewed: I have personally reviewed following labs and imaging studies  CBC:  Recent Labs  Lab 01/12/20 0440 01/13/20 0515 01/14/20 0416 01/15/20 0450 01/16/20 0453  WBC 14.5* 12.6* 12.1* 11.8* 13.8*  NEUTROABS  --   --  9.6*  --  10.8*  HGB 8.1* 7.4* 7.3* 7.4* 7.4*  HCT 23.8* 22.4* 22.8* 23.0* 23.2*  MCV 89.1 91.1 93.1 93.9 92.8  PLT 99* 117* 152 165 300    Basic Metabolic Panel: Recent Labs  Lab 01/05/2020 0519 01/11/20 0500 01/12/20 0440 01/13/20 1237 01/14/20 0416 01/15/20 0450 01/16/20 0453  NA 140   < > 135 134* 136 134* 138  K 4.7   < > 4.0 3.9 3.7 3.7 3.9  CL 110   < > 106 105 107 108 111  CO2 19*   < > 18* 20* 20* 17* 17*  GLUCOSE 139*   < > 127* 117* 112* 137* 127*  BUN 28*   < > 28* 28* 26* 26* 27*  CREATININE 1.59*   < > 1.26* 1.35* 1.29* 1.27* 1.27*  CALCIUM 8.9   < > 8.2* 8.0* 8.0* 7.8* 7.8*  MG 2.0  --   --   --  2.1 2.0 2.2  PHOS 3.6  --   --   --  3.1 2.5 2.7   < > = values in this interval not displayed.    GFR: Estimated Creatinine Clearance: 38.1 mL/min (Ad Guttman) (by C-G formula  based on SCr of 1.27 mg/dL (H)).  Liver Function Tests: Recent Labs  Lab 12/31/2019 0519 01/13/20 1237 01/14/20 0416 01/15/20 0450 01/16/20 0453  AST 22 37 36 40 44*  ALT 5 9 19 12 12   ALKPHOS 75 64 70 94 113  BILITOT 0.9 1.0 1.0 1.2 1.4*  PROT 7.1 5.9* 6.1* 6.0* 6.3*  ALBUMIN 3.5 2.7* 2.6* 2.4* 2.5*    CBG: Recent Labs  Lab 01/11/20 2110  GLUCAP 178*     Recent Results (from the past 240 hour(s))  Resp Panel by RT-PCR (Flu Rhesa Forsberg&B, Covid) Nasopharyngeal Swab     Status: None   Collection Time: 01/19/2020 11:50 PM   Specimen: Nasopharyngeal Swab; Nasopharyngeal(NP) swabs in vial transport medium  Result Value Ref Range Status   SARS Coronavirus 2 by RT PCR NEGATIVE NEGATIVE Final    Comment: (NOTE) SARS-CoV-2 target nucleic acids are NOT DETECTED.  The SARS-CoV-2 RNA is generally detectable in upper respiratory specimens during the acute phase of infection. The lowest concentration of SARS-CoV-2 viral copies this assay can detect is 138 copies/mL. Mare Ludtke negative result does not preclude SARS-Cov-2 infection and should not be used as the sole basis for treatment or other patient management decisions. Waleska Buttery negative result may occur with  improper specimen collection/handling, submission of specimen other than nasopharyngeal swab, presence of viral mutation(s) within the areas targeted by this assay, and inadequate number of viral copies(<138 copies/mL). Welton Bord negative result must be combined with clinical observations, patient history, and epidemiological information. The expected result is Negative.  Fact Sheet for Patients:  EntrepreneurPulse.com.au  Fact Sheet for Healthcare Providers:  IncredibleEmployment.be  This test is no t yet approved or cleared by the [place] FDA and  has been authorized for detection and/or diagnosis of SARS-CoV-2 by FDA under an Emergency Use Authorization (EUA). This EUA will remain  in effect (meaning this  test can be used) for the duration of the COVID-19 declaration under Section 564(b)(1) of the Act, 21 U.S.C.section 360bbb-3(b)(1), unless the authorization is terminated  or revoked sooner.       Influenza Aryannah Mohon by PCR NEGATIVE NEGATIVE  Final   Influenza B by PCR NEGATIVE NEGATIVE Final    Comment: (NOTE) The Xpert Xpress SARS-CoV-2/FLU/RSV plus assay is intended as an aid in the diagnosis of influenza from Nasopharyngeal swab specimens and should not be used as Roniyah Llorens sole basis for treatment. Nasal washings and aspirates are unacceptable for Xpert Xpress SARS-CoV-2/FLU/RSV testing.  Fact Sheet for Patients: EntrepreneurPulse.com.au  Fact Sheet for Healthcare Providers: IncredibleEmployment.be  This test is not yet approved or cleared by the [place] FDA and has been authorized for detection and/or diagnosis of SARS-CoV-2 by FDA under an Emergency Use Authorization (EUA). This EUA will remain in effect (meaning this test can be used) for the duration of the COVID-19 declaration under Section 564(b)(1) of the Act, 21 U.S.C. section 360bbb-3(b)(1), unless the authorization is terminated or revoked.  Performed at [facility], [address] [place], [place] [address]   Surgical pcr screen     Status: None   Collection Time: 01/10/2020  9:02 PM   Specimen: Nasal Mucosa; Nasal Swab  Result Value Ref Range Status   MRSA, PCR NEGATIVE NEGATIVE Final   Staphylococcus aureus NEGATIVE NEGATIVE Final    Comment: (NOTE) The Xpert SA Assay (FDA approved for NASAL specimens in patients 26 years of age and older), is one component of Mell Mellott comprehensive surveillance program. It is not intended to diagnose infection nor to guide or monitor treatment. Performed at [facility], [place] [address], [place], [place] [address]   Culture, blood (routine x 2)     Status: None (Preliminary result)   Collection Time: 01/12/20  7:21 PM   Specimen:  BLOOD  Result Value Ref Range Status   Specimen Description   Final    BLOOD RIGHT ANTECUBITAL Performed at [place] [address], [place], [place] [address]    Special Requests   Final    BOTTLES DRAWN AEROBIC AND ANAEROBIC Blood Culture adequate volume Performed at [place] [address], [place], [place] [address]    Culture   Final    NO GROWTH 4 DAYS Performed at [facility], [place] [address], [place], [place] [address]    Report Status PENDING  Incomplete  Culture, blood (routine x 2)     Status: None (Preliminary result)   Collection Time: 01/12/20  7:22 PM   Specimen: BLOOD  Result Value Ref Range Status   Specimen Description   Final    BLOOD LEFT ARM Performed at [place] [address], [place], [place] [address]    Special Requests   Final    BOTTLES DRAWN AEROBIC ONLY Blood Culture adequate volume Performed at [place] [address], [place], [place] [address]    Culture   Final    NO GROWTH 4 DAYS Performed at [facility], [place] [address], [place], [place] [address]    Report Status PENDING  Incomplete  Culture, Urine     Status: Abnormal   Collection Time: 01/13/20  1:20 AM   Specimen: Urine, Random  Result Value Ref Range Status   Specimen Description   Final    URINE, RANDOM Performed at [place] [address] [place], [place] [address]    Special Requests   Final    NONE Performed at [facility], [place] [address], [place],  [address]    Culture 50,000 COLONIES/mL PSEUDOMONAS AERUGINOSA (Brailey Buescher)  Final   Report Status 01/15/2020 FINAL  Final   Organism ID, Bacteria PSEUDOMONAS AERUGINOSA (Solomon Skowronek)  Final      Susceptibility   Pseudomonas aeruginosa - MIC*    CEFTAZIDIME 2 SENSITIVE Sensitive     CIPROFLOXACIN 0.5 SENSITIVE Sensitive     GENTAMICIN <=1 SENSITIVE Sensitive     IMIPENEM 2 SENSITIVE  Sensitive     PIP/TAZO <=4 SENSITIVE Sensitive     CEFEPIME 2 SENSITIVE Sensitive     * 50,000 COLONIES/mL PSEUDOMONAS AERUGINOSA         Radiology Studies: DG CHEST PORT 1 VIEW  Result Date: 01/15/2020 CLINICAL DATA:  Shortness of breath. EXAM: PORTABLE CHEST 1 VIEW COMPARISON:  Chest x-ray from yesterday. FINDINGS: Stable cardiomediastinal silhouette status post CABG and aortic valve replacement. Worsening hazy airspace disease in the right upper and lower lobes. Unchanged hazy airspace disease in the left mid lung and left lower lobe consolidation. No pneumothorax or large pleural effusion. No acute osseous abnormality. IMPRESSION: 1. Multifocal pneumonia, worsened in the right lung. Electronically Signed   By: Titus Dubin M.D.   On: 01/15/2020 12:29   DG Swallowing Func-Speech Pathology  Result Date: 01/15/2020 Objective Swallowing Evaluation: Type of Study: MBS-Modified Barium Swallow Study  Patient Details Name: AARION METZGAR MRN: [id number] Date of Birth: 07-Sep-1930 Today's Date: 01/15/2020 Time: SLP Start Time (ACUTE ONLY): 2202 -SLP Stop Time (ACUTE ONLY): 0901 SLP Time Calculation (min) (ACUTE ONLY): 26 min Past Medical History: Past Medical History: Diagnosis Date . Allergic rhinitis  . Anemia  . Arthritis   SHOULDER . Asthma with bronchitis  . Colon polyp  . COPD (chronic obstructive pulmonary disease) (HCC)   Astmatic bronchitis . Dysrhythmia 08/2012  NEW ONSET ATRIAL FIBRILATION . Gout   PMH . Headache(784.0)  . HLD (hyperlipidemia)  . Nephrolithiasis  . Osteoporosis  . RLS (restless legs syndrome)  . Skin cancer (melanoma) ([place]) 2012  Right neck . Skin cancer, basal cell   Dr Nevada Crane, [facility] Past Surgical History: Past Surgical History: Procedure Laterality Date . AORTIC VALVE REPLACEMENT N/Kayleann Mccaffery 02/20/2013  Procedure: AORTIC VALVE REPLACEMENT (AVR);  Surgeon: Ivin Poot, MD;  Location: [place];  Service: Open Heart Surgery;  Laterality: N/Coralynn Gaona; . COLONOSCOPY W/ POLYPECTOMY    Dr Deatra Ina .  CORONARY ARTERY BYPASS GRAFT N/Malon Siddall 02/20/2013  Procedure: CORONARY ARTERY BYPASS GRAFTING (CABG);  Surgeon: Ivin Poot, MD;  Location: [place];  Service: Open Heart Surgery;  Laterality: N/Kennah Hehr; . FINGER SURGERY    for foreign body . INGUINAL HERNIA REPAIR   . INTRAMEDULLARY (IM) NAIL INTERTROCHANTERIC Right 12/25/2019  Procedure: INTRAMEDULLARY (IM) NAIL INTERTROCHANTRIC;  Surgeon: Rod Can, MD;  Location: [facility];  Service: Orthopedics;  Laterality: Right; . INTRAOPERATIVE TRANSESOPHAGEAL ECHOCARDIOGRAM N/Shawnette Augello 02/20/2013  Procedure: INTRAOPERATIVE TRANSESOPHAGEAL ECHOCARDIOGRAM;  Surgeon: Ivin Poot, MD;  Location: [place];  Service: Open Heart Surgery;  Laterality: N/Darryle Dennie; . LEFT AND RIGHT HEART CATHETERIZATION WITH CORONARY ANGIOGRAM N/Andru Genter 01/05/2013  Procedure: LEFT AND RIGHT HEART CATHETERIZATION WITH CORONARY ANGIOGRAM;  Surgeon: Blane Ohara, MD;  Location: [facility] CATH LAB;  Service: Cardiovascular;  Laterality: N/Jalasia Eskridge; . LEG SURGERY  06/2010   Dr.Lawson, for blood clott. Left leg  . NECK SURGERY  12/2010  Melanoma ; [facility]  . NOSE SURGERY    Septal Deviation . RIGHT HEART CATHETERIZATION N/Delva Derden 01/23/2013  Procedure: RIGHT HEART CATH;  Surgeon: Jolaine Artist, MD;  Location: [facility] CATH LAB;  Service: Cardiovascular;  Laterality: N/Sani Madariaga; . TEE WITHOUT CARDIOVERSION N/Falon Flinchum 01/23/2013  Procedure: TRANSESOPHAGEAL ECHOCARDIOGRAM (TEE);  Surgeon: Shaune Pascal  Bensimhon, MD;  Location: [facility];  Service: Cardiovascular;  Laterality: N/Azzie Thiem;  sign heart cath consent w/tee consent HPI: Pt is an 86 y.o. male with medical history significant for hyperlipidemia, CAD, Sayan Aldava. fib on warfarin, GERD, CKD stage III, Parkinson's disease who presented to the ED via EMS after sustaining Liridona Mashaw fall at home. CXR 11/23: Worsening airspace disease in the left lower lung, suspect for pneumonia or potentially aspiration. Cardiomegaly. Pt was most recently seen by SLP on 03/17/13 and Sharina Petre dysphagia 2 diet with thin liquids was recommended at that time per  pt's request d/t thrush. MBS 02/26/13: Pt. exhibited mild oral dysphagia with delayed transit exacerbated by xerostomia and decreased endurance.  Mild-mod sensory pharyngeal dysphagia with reduced tongue base retraction and laryngeal elevation resulting in trace vallecular/pyriform sinus/pharyngeal wall residue.    Concerns for aspiration pna present.  Subjective: pt asleep in bed - aroused however in his room prior to transport Assessment / Plan / Recommendation CHL IP CLINICAL IMPRESSIONS 01/15/2020 Clinical Impression Moderate oral and mild pharyngeal dysphagia without overt aspiration.  Decreased oral coordination/transiting/strength noted with vertical mastication pattern.  Pt noted to have secretions retained in phayrnx which mixed with barium.  Minimal laryngeal penetration of thin liquids noted due to decreased laryngeal closure.  No coughing noted during testing however pt coughed immediately after testing with water intake.  Retention noted in vallecular space due to decreased tongue base retraction/epiglottic deflection.  Attempts at chin tuck were not successful.  Sensation of retention in phayrnx noted by pt only intermittently.  Recommend pt continue dys1/nectar diet at this time due to conserve energy, allowing tsps of thin.  Recommend pt's wife be allowed to provide soft whole foods in small amounts.  Follow up SLP to help strengthen pt's swallowing and pulmonary clearance.  Question if pt may have thrush, ? coating in oral cavity that did not clear with oral care/suctioning.  Will follow up. SLP Visit Diagnosis Dysphagia, unspecified (R13.10) Attention and concentration deficit following -- Frontal lobe and executive function deficit following -- Impact on safety and function Mild aspiration risk;Moderate aspiration risk   CHL IP TREATMENT RECOMMENDATION 01/15/2020 Treatment Recommendations Therapy as outlined in treatment plan below   Prognosis 01/15/2020 Prognosis for Safe Diet Advancement --  Barriers to Reach Goals Cognitive deficits Barriers/Prognosis Comment -- CHL IP DIET RECOMMENDATION 01/15/2020 SLP Diet Recommendations Dysphagia 1 (Puree) solids;Nectar thick liquid Liquid Administration via Straw;Cup Medication Administration Crushed with puree Compensations Slow rate;Small sips/bites;Other (Comment) Postural Changes --   CHL IP OTHER RECOMMENDATIONS 01/15/2020 Recommended Consults -- Oral Care Recommendations Oral care QID Other Recommendations Order thickener from pharmacy   CHL IP FOLLOW UP RECOMMENDATIONS 01/15/2020 Follow up Recommendations Skilled Nursing facility   [facility] IP FREQUENCY AND DURATION 01/15/2020 Speech Therapy Frequency (ACUTE ONLY) min 2x/week Treatment Duration 2 weeks      CHL IP ORAL PHASE 01/15/2020 Oral Phase Impaired Oral - Pudding Teaspoon -- Oral - Pudding Cup -- Oral - Honey Teaspoon -- Oral - Honey Cup -- Oral - Nectar Teaspoon Decreased bolus cohesion;Weak lingual manipulation Oral - Nectar Cup -- Oral - Nectar Straw Decreased bolus cohesion;Weak lingual manipulation Oral - Thin Teaspoon Decreased bolus cohesion;Weak lingual manipulation;Reduced posterior propulsion Oral - Thin Cup Decreased bolus cohesion;Weak lingual manipulation;Reduced posterior propulsion Oral - Thin Straw Decreased bolus cohesion;Weak lingual manipulation;Reduced posterior propulsion Oral - Puree Decreased bolus cohesion;Weak lingual manipulation;Reduced posterior propulsion Oral - Mech Soft Impaired mastication;Weak lingual manipulation;Decreased bolus cohesion;Reduced posterior propulsion;Lingual/palatal residue;Premature spillage Oral - Regular -- Oral -  Multi-Consistency -- Oral - Pill -- Oral Phase - Comment --  CHL IP PHARYNGEAL PHASE 01/15/2020 Pharyngeal Phase Impaired Pharyngeal- Pudding Teaspoon -- Pharyngeal -- Pharyngeal- Pudding Cup -- Pharyngeal -- Pharyngeal- Honey Teaspoon -- Pharyngeal -- Pharyngeal- Honey Cup -- Pharyngeal -- Pharyngeal- Nectar Teaspoon -- Pharyngeal --  Pharyngeal- Nectar Cup Pharyngeal residue - valleculae Pharyngeal Material does not enter airway Pharyngeal- Nectar Straw Reduced epiglottic inversion;Pharyngeal residue - valleculae Pharyngeal Material does not enter airway Pharyngeal- Thin Teaspoon Delayed swallow initiation-pyriform sinuses;Reduced tongue base retraction;Pharyngeal residue - valleculae Pharyngeal -- Pharyngeal- Thin Cup Delayed swallow initiation-pyriform sinuses;Penetration/Aspiration during swallow;Pharyngeal residue - valleculae Pharyngeal Material enters airway, CONTACTS cords and then ejected out Pharyngeal- Thin Straw Reduced tongue base retraction;Pharyngeal residue - valleculae;Penetration/Aspiration during swallow Pharyngeal Material enters airway, CONTACTS cords and then ejected out Pharyngeal- Puree Pharyngeal residue - valleculae;Reduced tongue base retraction Pharyngeal Material does not enter airway Pharyngeal- Mechanical Soft Delayed swallow initiation-vallecula;Reduced tongue base retraction;Pharyngeal residue - valleculae Pharyngeal Material does not enter airway Pharyngeal- Regular -- Pharyngeal -- Pharyngeal- Multi-consistency -- Pharyngeal -- Pharyngeal- Pill -- Pharyngeal -- Pharyngeal Comment --  CHL IP CERVICAL ESOPHAGEAL PHASE 01/15/2020 Cervical Esophageal Phase Impaired Pudding Teaspoon -- Pudding Cup -- Honey Teaspoon -- Honey Cup -- Nectar Teaspoon -- Nectar Cup -- Nectar Straw -- Thin Teaspoon -- Thin Cup -- Thin Straw -- Puree -- Mechanical Soft -- Regular -- Multi-consistency -- Pill -- Cervical Esophageal Comment prominent cricopharyngeus did not impair barium flow Kathleen Lime, MS [facility] SLP Acute Rehab Services Office [phone] Pager [phone] Macario Golds 01/15/2020, 9:26 AM                   Scheduled Meds: . sodium chloride   Intravenous Once  . carbidopa-levodopa  1 tablet Oral TID  . diltiazem  60 mg Oral Q6H  . docusate  100 mg Oral BID  . feeding supplement  237 mL Oral BID BM  . ferrous  sulfate  325 mg Oral Q breakfast  . gabapentin  100 mg Oral QHS  . ketotifen  1 drop Both Eyes Daily  . lidocaine   Inhalation QHS  . multivitamin with minerals   Oral Daily  . nystatin  5 mL Oral QID  . pantoprazole sodium  40 mg Oral Daily  . rosuvastatin  2.5 mg Oral QODAY  . warfarin  0.5 mg Oral ONCE-1600  . Warfarin - Pharmacist Dosing Inpatient   Does not apply q1600   Continuous Infusions: . piperacillin-tazobactam (ZOSYN)  IV 3.375 g (01/16/20 1422)     LOS: 7 days    Time spent: over 56 min    Fayrene Helper, MD [facility]   To contact the attending provider between 7A-7P or the covering provider during after hours 7P-7A, please log into the web site [email or web address] and access using universal [place] password for that web site. If you do not have the password, please call the hospital operator.  01/16/2020, 2:31 PM

## 2020-01-16 NOTE — Progress Notes (Signed)
Physical Therapy Treatment Patient Details Name: Robert Richard MRN: [id number] DOB: September 11, 1930 Today's Date: 01/16/2020    History of Present Illness 86 yo male admitted with R hip fracture after falling at home. S/P IM nail 01/07/2020. Transferred to progressive unit after having Afib with RVR. Hx of Parkinson's, A fib, DM, COPD, gout, RLS, CABG, osteoporosis, CKD    PT Comments    Pt in bed with son at bedside. General Comments: AxO X 1 following repeat commands.  Recognizes Son who was in room. General bed mobility comments: Assist for trunk and bil LEs. Utilized bedpad for scooting, positioning. Multimodal cueing required. Pt sat edge of bed for ~3 minutes with min/guard to min A for balance.  Shaky.  Unsteady.  Fearful. Assisted OOB to [facility].  General transfer comment: pt required + 2 Total Assist to pivot 1/4 turn from elevated bed to [facility].  Very fearful. Shaky.  Rigid. Gripping anything he can get a hold of. General Gait Details: Sit to stand only due to cognition and inability to weight shift or advance steps.  B LE are planted with poor flex posture (hips/knees) with narrow BOS. Positioned in recliner with multiple pillows.    Follow Up Recommendations  SNF     Equipment Recommendations  None recommended by PT    Recommendations for Other Services       Precautions / Restrictions Precautions Precautions: Fall Restrictions Weight Bearing Restrictions: No RLE Weight Bearing: Weight bearing as tolerated    Mobility  Bed Mobility Overal bed mobility: Needs Assistance Bed Mobility: Supine to Sit     Supine to sit: Total assist;+2 for physical assistance;+2 for safety/equipment;HOB elevated (pt 5%)     General bed mobility comments: Assist for trunk and bil LEs. Utilized bedpad for scooting, positioning. Multimodal cueing required. Pt sat edge of bed for ~3 minutes with min/guard to min A for balance.  Shaky.  Unsteady.  Fearful.  Transfers Overall transfer level: Needs  assistance Equipment used: None Transfers: Sit to/from [facility] Sit to Stand: Total assist;+2 physical assistance;+2 safety/equipment;From elevated surface (pt 5%) Stand pivot transfers: Total assist;+2 physical assistance;+2 safety/equipment;From elevated surface (pt 0%)       General transfer comment: pt required + 2 Total Assist to pivot 1/4 turn from elevated bed to [facility].  Very fearful. Shaky.  Rigid. Gripping anything he can get a hold of.  Ambulation/Gait             General Gait Details: Sit to stand only due to cognition and inability to weight shift or advance steps.  B LE are planted with poor flex posture (hips/knees) with narrow BOS.   Stairs             Wheelchair Mobility    Modified Rankin (Stroke Patients Only)       Balance                                            Cognition Arousal/Alertness: Awake/alert Behavior During Therapy: WFL for tasks assessed/performed Overall Cognitive Status: Within Functional Limits for tasks assessed                                 General Comments: AxO X 1 following repeat commands.  Recognizes Son who was in room.  Exercises      General Comments        Pertinent Vitals/Pain Pain Assessment: Faces Faces Pain Scale: Hurts little more Pain Location: R LE with mobility Pain Descriptors / Indicators: Grimacing;Guarding;Moaning;Discomfort Pain Intervention(s): Monitored during session;Repositioned    Home Living                      Prior Function            PT Goals (current goals can now be found in the care plan section) Progress towards PT goals: Progressing toward goals    Frequency    Min 2X/week      PT Plan Current plan remains appropriate    Co-evaluation              AM-PAC PT "6 Clicks" Mobility   Outcome Measure  Help needed turning from your back to your side while in a flat bed without using bedrails?:  Total Help needed moving from lying on your back to sitting on the side of a flat bed without using bedrails?: Total Help needed moving to and from a bed to a chair (including a wheelchair)?: Total Help needed standing up from a chair using your arms (e.g., wheelchair or bedside chair)?: Total Help needed to walk in hospital room?: Total Help needed climbing 3-5 steps with a railing? : Total 6 Click Score: 6    End of Session Equipment Utilized During Treatment: Gait belt Activity Tolerance: Patient limited by fatigue;Other (comment) (cognition) Patient left: in chair;with call bell/phone within reach;with family/visitor present Nurse Communication: Mobility status (RN in room during toilet transfer) PT Visit Diagnosis: Pain;Muscle weakness (generalized) (M62.81);History of falling (Z91.81);Difficulty in walking, not elsewhere classified (R26.2) Pain - Right/Left: Right Pain - part of body: Hip     Time: 1500-1530 PT Time Calculation (min) (ACUTE ONLY): 30 min  Charges:  $Therapeutic Activity: 23-37 mins                     Rica Koyanagi  PTA Acute  Rehabilitation Services Pager      [phone] Office      [phone]

## 2020-01-16 NOTE — Progress Notes (Signed)
[place] for warfarin Indication: atrial fibrillation and AVR  Allergies  Allergen Reactions  . Iodinated Diagnostic Agents Hives    Other reaction(s): RASH Other reaction(s): RASH  . Ioxaglate Hives    Other reaction(s): RASH    Patient Measurements: Height: 6\' 2"  (188 cm) Weight: 65.8 kg (145 lb) IBW/kg (Calculated) : 82.2 Heparin Dosing Weight:   Vital Signs: Temp: 97.5 F (36.4 C) (11/27 1004) Temp Source: Oral (11/27 1004) BP: 119/51 (11/27 1004) Pulse Rate: 78 (11/27 1004)  Labs: Recent Labs    01/14/20 0416 01/14/20 0416 01/15/20 0450 01/16/20 0453  HGB 7.3*   < > 7.4* 7.4*  HCT 22.8*  --  23.0* 23.2*  PLT 152  --  165 197  LABPROT 26.8*  --  29.3* 28.3*  INR 2.6*  --  2.9* 2.8*  CREATININE 1.29*  --  1.27* 1.27*   < > = values in this interval not displayed.    Estimated Creatinine Clearance: 38.1 mL/min (A) (by C-G formula based on SCr of 1.27 mg/dL (H)).   Medications:  - PTA warfarin regimen: 2mg  daily  Assessment: Patient is an 86 y.o M with hx afib and AVR on warfarin PTA, presented to the ED on 11/19 s/p fall with right hip fracture.  He underwent right femur intramedullary fixation on 11/21. Warfarin and lovenox 0.5mg /kg q12h started post-op on 11/21.  - 11/20: vit K 10mg  PO x1 - 11/22: 2 units PRBC  Today, 01/16/2020: - INR is therapeutic at 2.8 with dose held yesterday - hgb and plts low, but stable - drug-drug intxns: being on abx (zosyn) can make patient more sensitive to warfarin - in afib  Goal of Therapy:  INR 2-3 Monitor platelets by anticoagulation protocol: Yes   Plan:  - warfarin 0.5 mg PO x1 today - daily INR - monitor for s/sx bleeding  Earlene Bjelland P 01/16/2020,10:32 AM

## 2020-01-16 NOTE — Plan of Care (Signed)
°  Problem: Clinical Measurements: Goal: Will remain free from infection Outcome: Progressing Goal: Respiratory complications will improve Outcome: Progressing Goal: Cardiovascular complication will be avoided Outcome: Progressing   Problem: Activity: Goal: Risk for activity intolerance will decrease Outcome: Progressing   Problem: Elimination: Goal: Will not experience complications related to bowel motility Outcome: Progressing   Problem: Pain Managment: Goal: General experience of comfort will improve Outcome: Progressing   Problem: Safety: Goal: Ability to remain free from injury will improve Outcome: Progressing   Problem: Skin Integrity: Goal: Risk for impaired skin integrity will decrease Outcome: Progressing

## 2020-01-17 DIAGNOSIS — S72001A Fracture of unspecified part of neck of right femur, initial encounter for closed fracture: Secondary | ICD-10-CM | POA: Diagnosis not present

## 2020-01-17 LAB — CBC WITH DIFFERENTIAL/PLATELET
Abs Immature Granulocytes: 0.1 10*3/uL — ABNORMAL HIGH (ref 0.00–0.07)
Basophils Absolute: 0.1 10*3/uL (ref 0.0–0.1)
Basophils Relative: 0 %
Eosinophils Absolute: 0.1 10*3/uL (ref 0.0–0.5)
Eosinophils Relative: 1 %
HCT: 21.7 % — ABNORMAL LOW (ref 39.0–52.0)
Hemoglobin: 7 g/dL — ABNORMAL LOW (ref 13.0–17.0)
Immature Granulocytes: 1 %
Lymphocytes Relative: 8 %
Lymphs Abs: 1.1 10*3/uL (ref 0.7–4.0)
MCH: 30.2 pg (ref 26.0–34.0)
MCHC: 32.3 g/dL (ref 30.0–36.0)
MCV: 93.5 fL (ref 80.0–100.0)
Monocytes Absolute: 2 10*3/uL — ABNORMAL HIGH (ref 0.1–1.0)
Monocytes Relative: 14 %
Neutro Abs: 11.2 10*3/uL — ABNORMAL HIGH (ref 1.7–7.7)
Neutrophils Relative %: 76 %
Platelets: 233 10*3/uL (ref 150–400)
RBC: 2.32 MIL/uL — ABNORMAL LOW (ref 4.22–5.81)
RDW: 14.1 % (ref 11.5–15.5)
WBC: 14.6 10*3/uL — ABNORMAL HIGH (ref 4.0–10.5)
nRBC: 0.1 % (ref 0.0–0.2)

## 2020-01-17 LAB — COMPREHENSIVE METABOLIC PANEL
ALT: 16 U/L (ref 0–44)
AST: 59 U/L — ABNORMAL HIGH (ref 15–41)
Albumin: 2.4 g/dL — ABNORMAL LOW (ref 3.5–5.0)
Alkaline Phosphatase: 139 U/L — ABNORMAL HIGH (ref 38–126)
Anion gap: 10 (ref 5–15)
BUN: 29 mg/dL — ABNORMAL HIGH (ref 8–23)
CO2: 17 mmol/L — ABNORMAL LOW (ref 22–32)
Calcium: 7.9 mg/dL — ABNORMAL LOW (ref 8.9–10.3)
Chloride: 111 mmol/L (ref 98–111)
Creatinine, Ser: 1.47 mg/dL — ABNORMAL HIGH (ref 0.61–1.24)
GFR, Estimated: 46 mL/min — ABNORMAL LOW (ref >60)
Glucose, Bld: 136 mg/dL — ABNORMAL HIGH (ref 70–99)
Potassium: 4 mmol/L (ref 3.5–5.1)
Sodium: 138 mmol/L (ref 135–145)
Total Bilirubin: 1.5 mg/dL — ABNORMAL HIGH (ref 0.3–1.2)
Total Protein: 6.4 g/dL — ABNORMAL LOW (ref 6.5–8.1)

## 2020-01-17 LAB — CULTURE, BLOOD (ROUTINE X 2)
Culture: NO GROWTH
Culture: NO GROWTH
Special Requests: ADEQUATE
Special Requests: ADEQUATE

## 2020-01-17 LAB — PROTIME-INR
INR: 2.8 — ABNORMAL HIGH (ref 0.8–1.2)
Prothrombin Time: 28.5 seconds — ABNORMAL HIGH (ref 11.4–15.2)

## 2020-01-17 LAB — MAGNESIUM: Magnesium: 2.2 mg/dL (ref 1.7–2.4)

## 2020-01-17 LAB — PREPARE RBC (CROSSMATCH)

## 2020-01-17 LAB — PHOSPHORUS: Phosphorus: 2.7 mg/dL (ref 2.5–4.6)

## 2020-01-17 MED ORDER — WARFARIN SODIUM 1 MG PO TABS
1.0000 mg | ORAL_TABLET | Freq: Once | ORAL | Status: AC
  Administered 2020-01-17: 1 mg via ORAL
  Filled 2020-01-17: qty 1

## 2020-01-17 MED ORDER — FUROSEMIDE 10 MG/ML IJ SOLN
20.0000 mg | Freq: Once | INTRAMUSCULAR | Status: AC
  Administered 2020-01-17: 20 mg via INTRAVENOUS
  Filled 2020-01-17: qty 2

## 2020-01-17 MED ORDER — SODIUM CHLORIDE 0.9% IV SOLUTION: Freq: Once | INTRAVENOUS | Status: AC

## 2020-01-17 MED ORDER — SODIUM BICARBONATE 650 MG PO TABS
650.0000 mg | ORAL_TABLET | Freq: Two times a day (BID) | ORAL | Status: DC
  Administered 2020-01-17 – 2020-01-23 (×13): 650 mg via ORAL
  Filled 2020-01-17 (×13): qty 1

## 2020-01-17 NOTE — Progress Notes (Signed)
Occupational Therapy Treatment Patient Details Name: Robert Richard MRN: [id number] DOB: Aug 18, 1930 Today's Date: 01/17/2020    History of present illness 86 yo male admitted with R hip fracture after falling at home. S/P IM nail 01/02/2020. Transferred to progressive unit after having Afib with RVR. Hx of Parkinson's, A fib, DM, COPD, gout, RLS, CABG, osteoporosis, CKD   OT comments  Treatment focused on functional mobility - bed mobility and transfers in preparation for ADLs. Max assist to transfer to sitting, max assist to stand at side of bed with RW requiring hand placement to improve erect posture but patient unable to fully extend knees and bear weight on RLE. Returned to seated position. Attempted to perform stand pivot to recliner but patient began to not follow commands and sabotaged transfer with holding onto bed rails. Patient exhibited significant heavy breathing and patient returned to supine for safety with assistance of son. Patient's o2 sat 89-90% on RA and BP WFL. Patient's son shown exercises for patient to perform in supine to maintain ROM in UEs and RLE. Cont POC   Follow Up Recommendations  SNF    Equipment Recommendations  None recommended by OT    Recommendations for Other Services      Precautions / Restrictions Precautions Precautions: Fall Restrictions Weight Bearing Restrictions: No RUE Weight Bearing: Weight bearing as tolerated RLE Weight Bearing: Weight bearing as tolerated       Mobility Bed Mobility Overal bed mobility: Needs Assistance Bed Mobility: Supine to Sit     Supine to sit: Max assist;HOB elevated Sit to supine: Total assist;+2 for safety/equipment;+2 for physical assistance   General bed mobility comments: Max assist for assistance with RLE, trunk negotiation and pivot to edge of bed. On return to bed patient required total assist with son helping.  Transfers Overall transfer level: Needs assistance Equipment used: Rolling walker (2  wheeled) Transfers: Sit to/from Stand Sit to Stand: Max assist;From elevated surface;+2 safety/equipment         General transfer comment: Max assist to stand at edge of bed with elevated bed height, use of gait belt and walker. Tactile cues at back and chest to improve posture however patient leaning over walker with head down. Returned to seated position as patient unable to maintain erect position or fully extend knees. Attempted to perform stand pivot to recliner but patinet unable to follow commands and holding onto bedrail. Returned to ege of bed sitting. Patient breathing excessively heavy and following less commands and patient returned to supine. VS monitored - o2  sat 89-90% at side of bed and BP WFL in supine.    Balance Overall balance assessment: Needs assistance Sitting-balance support: Bilateral upper extremity supported;Feet supported Sitting balance-Leahy Scale: Poor Sitting balance - Comments: Using upper extremities to support, leaning to left to offload right hip   Standing balance support: During functional activity;Bilateral upper extremity supported Standing balance-Leahy Scale: Poor                             ADL either performed or assessed with clinical judgement   ADL                                               Vision Baseline Vision/History: Wears glasses Wears Glasses: At all times Patient Visual Report: No change from baseline  Perception     Praxis      Cognition Arousal/Alertness: Awake/alert Behavior During Therapy: WFL for tasks assessed/performed Overall Cognitive Status: History of cognitive impairments - at baseline                                 General Comments: Followed commands inconsistently. Alert and oriented x 1.        Exercises Other Exercises Other Exercises: Knee extension, mini hip hikes x 5 each leg at side of bed to prepare for standing. Other Exercises: Demonstrated  active assist hip flexion reps in supine to son to maintain ROM.   Shoulder Instructions       General Comments      Pertinent Vitals/ Pain       Pain Assessment: Faces Faces Pain Scale: Hurts a little bit Pain Location: R LE with hip flexion Pain Descriptors / Indicators: Grimacing Pain Intervention(s): Monitored during session;Limited activity within patient's tolerance  Home Living                                          Prior Functioning/Environment              Frequency  Min 2X/week        Progress Toward Goals  OT Goals(current goals can now be found in the care plan section)  Progress towards OT goals: Progressing toward goals  Acute Rehab OT Goals Patient Stated Goal: to walk OT Goal Formulation: With patient Time For Goal Achievement: 02/23/2020 Potential to Achieve Goals: [place] Discharge plan remains appropriate    Co-evaluation                 AM-PAC OT "6 Clicks" Daily Activity     Outcome Measure   Help from another person eating meals?: None Help from another person taking care of personal grooming?: A Little Help from another person toileting, which includes using toliet, bedpan, or urinal?: Total Help from another person bathing (including washing, rinsing, drying)?: A Lot Help from another person to put on and taking off regular upper body clothing?: A Little Help from another person to put on and taking off regular lower body clothing?: Total 6 Click Score: 14    End of Session Equipment Utilized During Treatment: Gait belt;Rolling walker  OT Visit Diagnosis: Unsteadiness on feet (R26.81);History of falling (Z91.81)   Activity Tolerance Patient limited by fatigue   Patient Left in bed;with call bell/phone within reach;with bed alarm set;with family/visitor present   Nurse Communication Mobility status        Time: 1427-1450 OT Time Calculation (min): 23 min  Charges: OT General Charges $OT  Visit: 1 Visit OT Treatments $Therapeutic Activity: 23-37 mins  Jaycelyn Orrison, OTR/L [place]  Office [phone] Pager: [place] 01/17/2020, 4:09 PM

## 2020-01-17 NOTE — Progress Notes (Signed)
PROGRESS NOTE    Robert Richard  GQQ:[id number] DOB: 10/27/1930 DOA: 01/10/2020 PCP: [name], MD   Chief Complaint  Patient presents with  . Fall  . Leg Injury   Brief Narrative:  Patient is 86yo patient with CAD, hyperlipidemia, Robert Richard. fib on warfarin, GERD, CKD stage III, Parkinson's disease presented to ED after sustaining Robert Richard fall at home.  History per his wife, patient had gotten out of the shower and while he was in the bathroom, he fell and landed on the right side of his face, right hip.  Patient sustained facial laceration with scalp contusion.  He also complained of right hip pain with difficulty in being able to bear weight on the right leg due to pain.  EMS was called. In ED BP was soft-2/52, and leukocytosis with normocytic anemia.  Creatinine 1.4.  CT imaging showed no acute intracranial abnormality but showed right frontal/supraorbital swelling and laceration extending to the temporal soft tissues, mild right liver contusion and laceration.  No facial bone fracture. Hip x-ray showed right hip fracture Patient was transferred to [facility] for further work-up  Assessment & Plan:   Principal Problem:   Closed right hip fracture, initial encounter [facility]) Active Problems:   Hyperlipidemia   CKD (chronic kidney disease)   CAD (coronary artery disease)   Atrial fibrillation with RVR ([place])   Diabetes mellitus without complication (HCC)   GERD (gastroesophageal reflux disease)   Parkinson's disease ([place])   Facial laceration   Leukocytosis   Hyperglycemia   Fall at home, initial encounter  Aspiration Pneumonia  Shortness of Breath CXR 11/23 with worsening airspace disease in LLL, concerning for pneumonia vs aspiration 11/25 CXR with persistent L mid and L lower lung airspace disease, new airspace densities within the RU and RL lung CXR 11/26 with multifocal pneumonia, worsened in the R lung Continues on RA, tachypneic.  Leukocytosis slightly worse today again.  Will  continue to monitor on zosyn.   Repeat CXR 11/29 Continue antibiotics -> zosyn (11/25 - present).  Plan for 5-7 days of abx. Blood cx NG SLP eval with mild/moderate aspiration risk -> dysphagia 1 diet, nectar thick liquid  Goals of care: appreciate palliative care assistance.  Pt DNR after conversation with palliative care.  Will continue to monitor course.  Has continued delirium, aspiration, worsening resp status post op.  Will continue to address [place] in the coming days.  Pseudomonas UTI Urine cx with pansensitive pseudomonas Continue zosyn  Acute Metabolic Encephalopathy: suspect 2/2 acute hospitalization, pain, recent surgery, aspiration pneumonia Repeat head CT 11/24 without acute intracranial abnormality Delirium precautions  Treat aspiration pneumonia and pseudomonas UTI Gradually improving  Acute comminuted closed right hip fracture, after mechanical fall at home [facility]) -Right hip x-ray showed acute comminuted intertrochanteric fracture of the right hip with superior subluxation -Underwent intramedullary fixation of the right femur (Dr [name]), on 67/12 -Complicated by anemia and Robert Richard. fib with RVR on 11/22, doing well today, will start PT - Continue warfarin for DVT ppx - therapy recommending SNF  Atrial fibrillation with RVR -Postop in atrial fibrillation with RVR on 11/22 requiring digoxin, IV Cardizem. Resumed oral Cardizem 240 mg daily -> transitioned to Q6 due to need to crush meds -Coumadin per pharmacy, cardiology now signed off  Acute on chronic blood loss anemia, postop -Hemoglobin 7.2 on 11/22, status post 2 units packed RBC transfusion - transfuse 1 unit pRBC 11/28, discussed with son - Relatively stable, labs with iron def anemia - PO iron  Right frontal/supraorbital swelling and laceration -Continue wound care, no active bleeding  Leukocytosis  Treated for aspiration pneumonia above  Diabetes mellitus type 2, diet controlled -Hemoglobin A1c 6.5  08/09/2019 -Continue carb modified diet  CKD stageIIIa  NAGMA -Baseline creatinine ~1.5 -Avoid nephrotoxic medications, hypotension - NAGMA, will continue to monitor, likely 2/2 CKD and IVF - holding topamax for now - start PO bicarb   Elevated LFTs:  Continue to monitor  CAD, hyperlipidemia Continue Crestor  GERD -Continue Protonix   Parkinson's disease Continue Sinemet  Underweight, protein calorie malnutrition Estimated body mass index is 18.62 kg/m as calculated from the following:   Height as of this encounter: 6\' 2"  (1.88 m).   Weight as of this encounter: 65.8 kg.  Nutritional supplements  DVT prophylaxis: warfarin Code Status: DNR Family Communication:  Son at bedside Disposition:   Status is: Inpatient  Remains inpatient appropriate because:Inpatient level of care appropriate due to severity of illness   Dispo: The patient is from: Home              Anticipated d/c is to: SNF              Anticipated d/c date is: > 3 days              Patient currently is not medically stable to d/c.       Consultants:   Palliative care  Cardiology  ortho  Procedures:  intramedullary fixation of the right femur (Dr [name]), on 11/21  Antimicrobials:  Anti-infectives (From admission, onward)   Start     Dose/Rate Route Frequency Ordered Stop   01/14/20 0830  piperacillin-tazobactam (ZOSYN) IVPB 3.375 g        3.375 g 12.5 mL/hr over 240 Minutes Intravenous Every 8 hours 01/14/20 0806     01/12/20 2100  vancomycin (VANCOCIN) IVPB 1000 mg/200 mL premix        1,000 mg 200 mL/hr over 60 Minutes Intravenous  Once 01/12/20 2001 01/13/20 0548   01/12/20 2030  cefTRIAXone (ROCEPHIN) 1 g in sodium chloride 0.9 % 100 mL IVPB  Status:  Discontinued        1 g 200 mL/hr over 30 Minutes Intravenous Every 24 hours 01/12/20 1930 01/14/20 0756   01/05/2020 1500  ceFAZolin (ANCEF) IVPB 2g/100 mL premix        2 g 200 mL/hr over 30 Minutes Intravenous Every 6  hours 01/06/2020 1113 12/25/2019 2141     Subjective: Denies any complaints  Objective: Vitals:   01/16/20 2350 01/17/20 0549 01/17/20 1048 01/17/20 1309  BP: 120/76 127/64 (!) 112/57 (!) 122/54  Pulse: 64 88 90 (!) 103  Resp: (!) 24 (!) 24 (!) 24 (!) 24  Temp: 98.2 F (36.8 C) 98.7 F (37.1 C) 98.5 F (36.9 C) 99.2 F (37.3 C)  TempSrc: Oral Oral  Oral  SpO2: 94%  92% 90%  Weight:      Height:        Intake/Output Summary (Last 24 hours) at 01/17/2020 1506 Last data filed at 01/17/2020 0600 Gross per 24 hour  Intake 0 ml  Output 650 ml  Net -650 ml   Filed Weights   12/24/2019 2309 01/07/2020 2312  Weight: 61.2 kg 65.8 kg    Examination:  General: No acute distress. Cardiovascular: Heart sounds show Vernelle Wisner regular rate, and rhythm Lungs: Clear to auscultation bilaterally  Abdomen: Soft, nontender, nondistended  Neurological: pleasantly confused, moving all extremities Skin: Warm and dry. No rashes or lesions. Extremities:  RLE with intact dressing   Data Reviewed: I have personally reviewed following labs and imaging studies  CBC: Recent Labs  Lab 01/13/20 0515 01/14/20 0416 01/15/20 0450 01/16/20 0453 01/17/20 0558  WBC 12.6* 12.1* 11.8* 13.8* 14.6*  NEUTROABS  --  9.6*  --  10.8* 11.2*  HGB 7.4* 7.3* 7.4* 7.4* 7.0*  HCT 22.4* 22.8* 23.0* 23.2* 21.7*  MCV 91.1 93.1 93.9 92.8 93.5  PLT 117* 152 165 197 353    Basic Metabolic Panel: Recent Labs  Lab 01/13/20 1237 01/14/20 0416 01/15/20 0450 01/16/20 0453 01/17/20 0558  NA 134* 136 134* 138 138  K 3.9 3.7 3.7 3.9 4.0  CL 105 107 108 111 111  CO2 20* 20* 17* 17* 17*  GLUCOSE 117* 112* 137* 127* 136*  BUN 28* 26* 26* 27* 29*  CREATININE 1.35* 1.29* 1.27* 1.27* 1.47*  CALCIUM 8.0* 8.0* 7.8* 7.8* 7.9*  MG  --  2.1 2.0 2.2 2.2  PHOS  --  3.1 2.5 2.7 2.7    GFR: Estimated Creatinine Clearance: 32.9 mL/min (Fredrick Geoghegan) (by C-G formula based on SCr of 1.47 mg/dL (H)).  Liver Function Tests: Recent Labs  Lab  01/13/20 1237 01/14/20 0416 01/15/20 0450 01/16/20 0453 01/17/20 0558  AST 37 36 40 44* 59*  ALT 9 19 12 12 16   ALKPHOS 64 70 94 113 139*  BILITOT 1.0 1.0 1.2 1.4* 1.5*  PROT 5.9* 6.1* 6.0* 6.3* 6.4*  ALBUMIN 2.7* 2.6* 2.4* 2.5* 2.4*    CBG: Recent Labs  Lab 01/11/20 2110  GLUCAP 178*     Recent Results (from the past 240 hour(s))  Resp Panel by RT-PCR (Flu Genavive Kubicki&B, Covid) Nasopharyngeal Swab     Status: None   Collection Time: 01/01/2020 11:50 PM   Specimen: Nasopharyngeal Swab; Nasopharyngeal(NP) swabs in vial transport medium  Result Value Ref Range Status   SARS Coronavirus 2 by RT PCR NEGATIVE NEGATIVE Final    Comment: (NOTE) SARS-CoV-2 target nucleic acids are NOT DETECTED.  The SARS-CoV-2 RNA is generally detectable in upper respiratory specimens during the acute phase of infection. The lowest concentration of SARS-CoV-2 viral copies this assay can detect is 138 copies/mL. Jovanne Riggenbach negative result does not preclude SARS-Cov-2 infection and should not be used as the sole basis for treatment or other patient management decisions. Corderro Koloski negative result may occur with  improper specimen collection/handling, submission of specimen other than nasopharyngeal swab, presence of viral mutation(s) within the areas targeted by this assay, and inadequate number of viral copies(<138 copies/mL). Kingsten Enfield negative result must be combined with clinical observations, patient history, and epidemiological information. The expected result is Negative.  Fact Sheet for Patients:  EntrepreneurPulse.com.au  Fact Sheet for Healthcare Providers:  IncredibleEmployment.be  This test is no t yet approved or cleared by the [place] FDA and  has been authorized for detection and/or diagnosis of SARS-CoV-2 by FDA under an Emergency Use Authorization (EUA). This EUA will remain  in effect (meaning this test can be used) for the duration of the COVID-19 declaration under  Section 564(b)(1) of the Act, 21 U.S.C.section 360bbb-3(b)(1), unless the authorization is terminated  or revoked sooner.       Influenza Keilee Denman by PCR NEGATIVE NEGATIVE Final   Influenza B by PCR NEGATIVE NEGATIVE Final    Comment: (NOTE) The Xpert Xpress SARS-CoV-2/FLU/RSV plus assay is intended as an aid in the diagnosis of influenza from Nasopharyngeal swab specimens and should not be used as Shaylyn Bawa sole basis for treatment. Nasal washings and aspirates  are unacceptable for Xpert Xpress SARS-CoV-2/FLU/RSV testing.  Fact Sheet for Patients: EntrepreneurPulse.com.au  Fact Sheet for Healthcare Providers: IncredibleEmployment.be  This test is not yet approved or cleared by the [place] FDA and has been authorized for detection and/or diagnosis of SARS-CoV-2 by FDA under an Emergency Use Authorization (EUA). This EUA will remain in effect (meaning this test can be used) for the duration of the COVID-19 declaration under Section 564(b)(1) of the Act, 21 U.S.C. section 360bbb-3(b)(1), unless the authorization is terminated or revoked.  Performed at [facility], [address], [place], [place] [address]   Surgical pcr screen     Status: None   Collection Time: 01/19/2020  9:02 PM   Specimen: Nasal Mucosa; Nasal Swab  Result Value Ref Range Status   MRSA, PCR NEGATIVE NEGATIVE Final   Staphylococcus aureus NEGATIVE NEGATIVE Final    Comment: (NOTE) The Xpert SA Assay (FDA approved for NASAL specimens in patients 12 years of age and older), is one component of Tyara Dassow comprehensive surveillance program. It is not intended to diagnose infection nor to guide or monitor treatment. Performed at [facility], [place] [address] [place], [place] [address]   Culture, blood (routine x 2)     Status: None   Collection Time: 01/12/20  7:21 PM   Specimen: BLOOD  Result Value Ref Range Status   Specimen Description   Final    BLOOD RIGHT  ANTECUBITAL Performed at [facility] [address] [place], [place] [address]    Special Requests   Final    BOTTLES DRAWN AEROBIC AND ANAEROBIC Blood Culture adequate volume Performed at [facility] [address] [place], [place] [address]    Culture   Final    NO GROWTH 5 DAYS Performed at [facility], [place] [address] [place], [place] [address]    Report Status 01/17/2020 FINAL  Final  Culture, blood (routine x 2)     Status: None   Collection Time: 01/12/20  7:22 PM   Specimen: BLOOD  Result Value Ref Range Status   Specimen Description   Final    BLOOD LEFT ARM Performed at [facility] [address], [place], [place] [address]    Special Requests   Final    BOTTLES DRAWN AEROBIC ONLY Blood Culture adequate volume Performed at [facility] [address], [place], [place] [address]    Culture   Final    NO GROWTH 5 DAYS Performed at [facility], [place] [address] [place], [place] [address]    Report Status 01/17/2020 FINAL  Final  Culture, Urine     Status: Abnormal   Collection Time: 01/13/20  1:20 AM   Specimen: Urine, Random  Result Value Ref Range Status   Specimen Description   Final    URINE, RANDOM Performed at [facility] [address] [place], [place] [address]    Special Requests   Final    NONE Performed at [facility], [place] [address] [place], [place] [address]    Culture 50,000 COLONIES/mL PSEUDOMONAS AERUGINOSA (Joven Mom)  Final   Report Status 01/15/2020 FINAL  Final   Organism ID, Bacteria PSEUDOMONAS AERUGINOSA (Mariangel Ringley)  Final      Susceptibility   Pseudomonas aeruginosa - MIC*    CEFTAZIDIME 2 SENSITIVE Sensitive     CIPROFLOXACIN 0.5 SENSITIVE Sensitive     GENTAMICIN <=1 SENSITIVE Sensitive     IMIPENEM  2 SENSITIVE Sensitive     PIP/TAZO <=4 SENSITIVE Sensitive     CEFEPIME 2 SENSITIVE Sensitive     * 50,000  COLONIES/mL PSEUDOMONAS AERUGINOSA         Radiology Studies: No results found.      Scheduled Meds: . sodium chloride   Intravenous Once  . sodium chloride   Intravenous Once  . carbidopa-levodopa  1 tablet Oral TID  . diltiazem  60 mg Oral Q6H  . docusate  100 mg Oral BID  . feeding supplement  237 mL Oral BID BM  . ferrous sulfate  325 mg Oral Q breakfast  . furosemide  20 mg Intravenous Once  . gabapentin  100 mg Oral QHS  . ketotifen  1 drop Both Eyes Daily  . lidocaine   Inhalation QHS  . multivitamin with minerals   Oral Daily  . nystatin  5 mL Oral QID  . pantoprazole sodium  40 mg Oral Daily  . rosuvastatin  2.5 mg Oral QODAY  . warfarin  1 mg Oral ONCE-1600  . Warfarin - Pharmacist Dosing Inpatient   Does not apply q1600   Continuous Infusions: . piperacillin-tazobactam (ZOSYN)  IV 3.375 g (01/17/20 1322)     LOS: 8 days    Time spent: over 60 min    Fayrene Helper, MD [facility]   To contact the attending provider between 7A-7P or the covering provider during after hours 7P-7A, please log into the web site [email or web address] and access using universal [place] password for that web site. If you do not have the password, please call the hospital operator.  01/17/2020, 3:06 PM

## 2020-01-17 NOTE — Progress Notes (Signed)
[place] for warfarin Indication: atrial fibrillation and AVR  Allergies  Allergen Reactions  . Iodinated Diagnostic Agents Hives    Other reaction(s): RASH Other reaction(s): RASH  . Ioxaglate Hives    Other reaction(s): RASH    Patient Measurements: Height: 6\' 2"  (188 cm) Weight: 65.8 kg (145 lb) IBW/kg (Calculated) : 82.2 Heparin Dosing Weight:   Vital Signs: Temp: 98.5 F (36.9 C) (11/28 1048) Temp Source: Oral (11/28 0549) BP: 112/57 (11/28 1048) Pulse Rate: 90 (11/28 1048)  Labs: Recent Labs    01/15/20 0450 01/15/20 0450 01/16/20 0453 01/17/20 0558  HGB 7.4*   < > 7.4* 7.0*  HCT 23.0*  --  23.2* 21.7*  PLT 165  --  197 233  LABPROT 29.3*  --  28.3* 28.5*  INR 2.9*  --  2.8* 2.8*  CREATININE 1.27*  --  1.27* 1.47*   < > = values in this interval not displayed.    Estimated Creatinine Clearance: 32.9 mL/min (A) (by C-G formula based on SCr of 1.47 mg/dL (H)).   Medications:  - PTA warfarin regimen: 2mg  daily  Assessment: Patient is an 86 y.o M with hx afib and AVR on warfarin PTA, presented to the ED on 11/19 s/p fall with right hip fracture.  He underwent right femur intramedullary fixation on 11/21. Warfarin and lovenox 0.5mg /kg q12h started post-op on 11/21.  - 11/20: vit K 10mg  PO x1 - 11/22: 2 units PRBC - 11/24: lovenox d/ced  Today, 01/17/2020: - INR remains therapeutic at 2.8 - hgb and plts low, but stable - drug-drug intxns: being on abx (zosyn) can make patient more sensitive to warfarin - in afib - scr up 1.47  Goal of Therapy:  INR 2-3 Monitor platelets by anticoagulation protocol: Yes   Plan:  - warfarin 1 mg PO x1 today - daily INR - monitor for s/sx bleeding  Dia Sitter P 01/17/2020,12:06 PM

## 2020-01-18 ENCOUNTER — Inpatient Hospital Stay (HOSPITAL_COMMUNITY): Payer: Medicare Other

## 2020-01-18 DIAGNOSIS — S72001A Fracture of unspecified part of neck of right femur, initial encounter for closed fracture: Secondary | ICD-10-CM | POA: Diagnosis not present

## 2020-01-18 LAB — BRAIN NATRIURETIC PEPTIDE: B Natriuretic Peptide: 330.6 pg/mL — ABNORMAL HIGH (ref 0.0–100.0)

## 2020-01-18 LAB — CBC WITH DIFFERENTIAL/PLATELET
Abs Immature Granulocytes: 0.13 10*3/uL — ABNORMAL HIGH (ref 0.00–0.07)
Basophils Absolute: 0 10*3/uL (ref 0.0–0.1)
Basophils Relative: 0 %
Eosinophils Absolute: 0.1 10*3/uL (ref 0.0–0.5)
Eosinophils Relative: 1 %
HCT: 23.2 % — ABNORMAL LOW (ref 39.0–52.0)
Hemoglobin: 7.4 g/dL — ABNORMAL LOW (ref 13.0–17.0)
Immature Granulocytes: 1 %
Lymphocytes Relative: 7 %
Lymphs Abs: 1.1 10*3/uL (ref 0.7–4.0)
MCH: 29.4 pg (ref 26.0–34.0)
MCHC: 31.9 g/dL (ref 30.0–36.0)
MCV: 92.1 fL (ref 80.0–100.0)
Monocytes Absolute: 2 10*3/uL — ABNORMAL HIGH (ref 0.1–1.0)
Monocytes Relative: 13 %
Neutro Abs: 12 10*3/uL — ABNORMAL HIGH (ref 1.7–7.7)
Neutrophils Relative %: 78 %
Platelets: 251 10*3/uL (ref 150–400)
RBC: 2.52 MIL/uL — ABNORMAL LOW (ref 4.22–5.81)
RDW: 14.6 % (ref 11.5–15.5)
WBC: 15.4 10*3/uL — ABNORMAL HIGH (ref 4.0–10.5)
nRBC: 0 % (ref 0.0–0.2)

## 2020-01-18 LAB — HEMOGLOBIN A1C
Hgb A1c MFr Bld: 5.5 % (ref 4.8–5.6)
Mean Plasma Glucose: 111.15 mg/dL

## 2020-01-18 LAB — COMPREHENSIVE METABOLIC PANEL
ALT: 21 U/L (ref 0–44)
AST: 52 U/L — ABNORMAL HIGH (ref 15–41)
Albumin: 2.4 g/dL — ABNORMAL LOW (ref 3.5–5.0)
Alkaline Phosphatase: 140 U/L — ABNORMAL HIGH (ref 38–126)
Anion gap: 11 (ref 5–15)
BUN: 29 mg/dL — ABNORMAL HIGH (ref 8–23)
CO2: 18 mmol/L — ABNORMAL LOW (ref 22–32)
Calcium: 7.8 mg/dL — ABNORMAL LOW (ref 8.9–10.3)
Chloride: 111 mmol/L (ref 98–111)
Creatinine, Ser: 1.41 mg/dL — ABNORMAL HIGH (ref 0.61–1.24)
GFR, Estimated: 48 mL/min — ABNORMAL LOW (ref >60)
Glucose, Bld: 151 mg/dL — ABNORMAL HIGH (ref 70–99)
Potassium: 3.8 mmol/L (ref 3.5–5.1)
Sodium: 140 mmol/L (ref 135–145)
Total Bilirubin: 1.7 mg/dL — ABNORMAL HIGH (ref 0.3–1.2)
Total Protein: 6.2 g/dL — ABNORMAL LOW (ref 6.5–8.1)

## 2020-01-18 LAB — GLUCOSE, CAPILLARY
Glucose-Capillary: 153 mg/dL — ABNORMAL HIGH (ref 70–99)
Glucose-Capillary: 175 mg/dL — ABNORMAL HIGH (ref 70–99)

## 2020-01-18 LAB — PROTIME-INR
INR: 2.6 — ABNORMAL HIGH (ref 0.8–1.2)
Prothrombin Time: 27.3 seconds — ABNORMAL HIGH (ref 11.4–15.2)

## 2020-01-18 LAB — MAGNESIUM: Magnesium: 2.2 mg/dL (ref 1.7–2.4)

## 2020-01-18 LAB — PHOSPHORUS: Phosphorus: 2.7 mg/dL (ref 2.5–4.6)

## 2020-01-18 MED ORDER — FUROSEMIDE 10 MG/ML IJ SOLN
20.0000 mg | Freq: Every day | INTRAMUSCULAR | Status: DC
  Administered 2020-01-19 – 2020-01-22 (×4): 20 mg via INTRAVENOUS
  Filled 2020-01-18 (×4): qty 2

## 2020-01-18 MED ORDER — INSULIN ASPART 100 UNIT/ML ~~LOC~~ SOLN
0.0000 [IU] | Freq: Three times a day (TID) | SUBCUTANEOUS | Status: DC
  Administered 2020-01-19: 2 [IU] via SUBCUTANEOUS
  Administered 2020-01-19 – 2020-01-20 (×3): 1 [IU] via SUBCUTANEOUS
  Administered 2020-01-20: 2 [IU] via SUBCUTANEOUS
  Administered 2020-01-20 – 2020-01-21 (×2): 1 [IU] via SUBCUTANEOUS
  Administered 2020-01-21: 2 [IU] via SUBCUTANEOUS
  Administered 2020-01-21 – 2020-01-25 (×6): 1 [IU] via SUBCUTANEOUS

## 2020-01-18 MED ORDER — WARFARIN SODIUM 1 MG PO TABS
1.0000 mg | ORAL_TABLET | Freq: Once | ORAL | Status: AC
  Administered 2020-01-18: 1 mg via ORAL
  Filled 2020-01-18: qty 1

## 2020-01-18 MED ORDER — FUROSEMIDE 10 MG/ML IJ SOLN
20.0000 mg | Freq: Once | INTRAMUSCULAR | Status: AC
  Administered 2020-01-18: 20 mg via INTRAVENOUS
  Filled 2020-01-18: qty 2

## 2020-01-18 NOTE — Care Management Important Message (Signed)
Important Message  Patient Details IM Letter given to the Patient. Name: Robert Richard MRN: [id number] Date of Birth: 04-Jan-1931   Medicare Important Message Given:  Yes     Kerin Salen 01/18/2020, 10:32 AM

## 2020-01-18 NOTE — Progress Notes (Signed)
  Speech Language Pathology Treatment: Dysphagia  Patient Details Name: Robert Richard MRN: [id number] DOB: 1931-01-12 Today's Date: 01/18/2020   Time: 1330-1340 and  [id number] SLP Time Calculation (min) (ACUTE ONLY): 15 min  Assessment / Plan / Recommendation Clinical Impression  Patient seen with wife present for education regarding dysphagia. SLP showed wife some films from recent MBS and discussed diet consistencies and patient's toleration. Patient was in recliner and SLP unable to awaken him adeqauately for safety with PO's. SLP then came later in day and patient was alert, awake and drank straw sips of nectar thick liquids. He exhibited one mild delayed throat clear but frequent, though mild intensity, belching. He also exhibited some heavier breathing after PO intake but did not appear in distress.   HPI HPI: Pt is an 86 y.o. male with medical history significant for hyperlipidemia, CAD, A. fib on warfarin, GERD, CKD stage III, Parkinson's disease who presented to the ED via EMS after sustaining a fall at home. CXR 11/23: Worsening airspace disease in the left lower lung, suspect for pneumonia or potentially aspiration. Cardiomegaly. Pt was most recently seen by SLP on 03/17/13 and a dysphagia 2 diet with thin liquids was recommended at that time per pt's request d/t thrush. MBS 02/26/13: Pt. exhibited mild oral dysphagia with delayed transit exacerbated by xerostomia and decreased endurance.  Mild-mod sensory pharyngeal dysphagia with reduced tongue base retraction and laryngeal elevation resulting in trace vallecular/pyriform sinus/pharyngeal wall residue.    Concerns for aspiration pna present.      SLP Plan  Continue with current plan of care       Recommendations  Diet recommendations: Dysphagia 3 (mechanical soft);Nectar-thick liquid Liquids provided via: Cup;Straw Medication Administration: Crushed with puree Supervision: Full supervision/cueing for compensatory  strategies Compensations: Slow rate;Small sips/bites;Other (Comment) Postural Changes and/or Swallow Maneuvers: Seated upright 90 degrees;Upright 30-60 min after meal                Oral Care Recommendations: Oral care BID Follow up Recommendations: Skilled Nursing facility SLP Visit Diagnosis: Dysphagia, unspecified (R13.10) Plan: Continue with current plan of care       GO             Sonia Baller, MA, CCC-SLP Speech Therapy

## 2020-01-18 NOTE — Progress Notes (Signed)
Physical Therapy Treatment Patient Details Name: Robert Richard MRN: [id number] DOB: 10-20-1930 Today's Date: 01/18/2020    History of Present Illness 86 yo male admitted with R hip fracture after falling at home. S/P IM nail 12/21/2019. Transferred to progressive unit after having Afib with RVR. Hx of Parkinson's, A fib, DM, COPD, gout, RLS, CABG, osteoporosis, CKD    PT Comments    Pt continues to require Max assist +2 for mobility. He was able to stand x 2 with the STEDY on today. Improved alertness and ability to follow commands on today compared to previous sessions. He remains at high risk for falls when mobilizing. Will need SNF.     Follow Up Recommendations  SNF     Equipment Recommendations  None recommended by PT    Recommendations for Other Services       Precautions / Restrictions Precautions Precautions: Fall Restrictions Weight Bearing Restrictions: No RLE Weight Bearing: Weight bearing as tolerated    Mobility  Bed Mobility Overal bed mobility: Needs Assistance Bed Mobility: Supine to Sit     Supine to sit: Max assist;+2 for physical assistance;+2 for safety/equipment;HOB elevated     General bed mobility comments: Assist for trunk and bil LEs. Utilized bedpad to aid with scooting, postioning. Multimodal cueing required. Increased time. Moderate leaning to L side in sitting.  Transfers Overall transfer level: Needs assistance Equipment used: Rolling walker (2 wheeled) Transfers: Sit to/from Stand Sit to Stand: Max assist;+2 physical assistance;+2 safety/equipment;From elevated surface         General transfer comment: Sit to stand x 2 (once from elevagted bed, once from STEDY seat). Multimodal cueing required. Assist to power up, stabilize, control descent. Moderate lean to L side in standing.  Ambulation/Gait             General Gait Details: NT-pt unable   Stairs             Wheelchair Mobility    Modified Rankin (Stroke  Patients Only)       Balance Overall balance assessment: Needs assistance;History of Falls         Standing balance support: Bilateral upper extremity supported Standing balance-Leahy Scale: Poor                              Cognition Arousal/Alertness: Awake/alert Behavior During Therapy: WFL for tasks assessed/performed Overall Cognitive Status: Impaired/Different from baseline Area of Impairment: Orientation;Memory;Following commands;Problem solving                 Orientation Level: Disoriented to;Place;Time;Situation   Memory: Decreased short-term memory Following Commands: Follows one step commands inconsistently     Problem Solving: Slow processing;Decreased initiation;Difficulty sequencing;Requires verbal cues;Requires tactile cues        Exercises General Exercises - Lower Extremity Long Arc Quad: Both;10 reps;Seated (AA on R)    General Comments        Pertinent Vitals/Pain Pain Assessment: Faces Faces Pain Scale: Hurts a little bit Pain Location: R LE Pain Descriptors / Indicators: Grimacing;Discomfort Pain Intervention(s): Limited activity within patient's tolerance;Monitored during session;Repositioned    Home Living                      Prior Function            PT Goals (current goals can now be found in the care plan section) Progress towards PT goals: Progressing toward goals    Frequency  Min 2X/week      PT Plan Current plan remains appropriate    Co-evaluation              AM-PAC PT "6 Clicks" Mobility   Outcome Measure  Help needed turning from your back to your side while in a flat bed without using bedrails?: Total Help needed moving from lying on your back to sitting on the side of a flat bed without using bedrails?: Total Help needed moving to and from a bed to a chair (including a wheelchair)?: Total Help needed standing up from a chair using your arms (e.g., wheelchair or bedside  chair)?: Total Help needed to walk in hospital room?: Total Help needed climbing 3-5 steps with a railing? : Total 6 Click Score: 6    End of Session Equipment Utilized During Treatment: Gait belt Activity Tolerance: Patient tolerated treatment well Patient left: in chair;with call bell/phone within reach;with chair alarm set;with family/visitor present Nurse Communication:  need for lift equipment (STEDY vs Maxi Move)(lift sling in place) PT Visit Diagnosis: Pain;Muscle weakness (generalized) (M62.81);History of falling (Z91.81);Difficulty in walking, not elsewhere classified (R26.2) Pain - Right/Left: Right Pain - part of body: Hip     Time: 1110-1131 PT Time Calculation (min) (ACUTE ONLY): 21 min  Charges:  $Therapeutic Activity: 8-22 mins                         Doreatha Massed, PT Acute Rehabilitation  Office: [phone] Pager: [phone]

## 2020-01-18 NOTE — Progress Notes (Signed)
[place] for warfarin Indication: atrial fibrillation and AVR  Allergies  Allergen Reactions  . Iodinated Diagnostic Agents Hives    Other reaction(s): RASH Other reaction(s): RASH  . Ioxaglate Hives    Other reaction(s): RASH    Patient Measurements: Height: 6\' 2"  (188 cm) Weight: 65.8 kg (145 lb) IBW/kg (Calculated) : 82.2 Heparin Dosing Weight:   Vital Signs: Temp: 98.6 F (37 C) (11/29 1316) Temp Source: Oral (11/29 1316) BP: 120/55 (11/29 1248) Pulse Rate: 88 (11/29 1248)  Labs: Recent Labs    01/16/20 0453 01/16/20 0453 01/17/20 0558 01/18/20 0419  HGB 7.4*   < > 7.0* 7.4*  HCT 23.2*  --  21.7* 23.2*  PLT 197  --  233 251  LABPROT 28.3*  --  28.5* 27.3*  INR 2.8*  --  2.8* 2.6*  CREATININE 1.27*  --  1.47* 1.41*   < > = values in this interval not displayed.    Estimated Creatinine Clearance: 34.4 mL/min (A) (by C-G formula based on SCr of 1.41 mg/dL (H)).   Medications:  - PTA warfarin regimen: 2mg  daily  Assessment: Patient is an 86 y.o M with hx afib and AVR on warfarin PTA, presented to the ED on 11/19 s/p fall with right hip fracture.  He underwent right femur intramedullary fixation on 11/21. Warfarin and lovenox 0.5mg /kg q12h started post-op on 11/21.  - 11/20: vit K 10mg  PO x1 - 11/22: 2 units PRBC - 11/24: lovenox d/ced  Today, 01/18/2020: - INR remains therapeutic at 2.6 - Hg low, but stable, PLT WNL - drug-drug intxns: being on abx (zosyn) can make patient more sensitive to warfarin - in afib - scr 1.41  Goal of Therapy:  INR 2-3 Monitor platelets by anticoagulation protocol: Yes   Plan:  - repeat warfarin 1 mg PO x1 today - daily INR - monitor for s/sx bleeding  Eudelia Bunch, Pharm.D 01/18/2020 1:48 PM

## 2020-01-18 NOTE — Progress Notes (Signed)
PROGRESS NOTE    Robert Richard  YPP:[id number] DOB: 11/21/1930 DOA: 12/26/2019 PCP: [name], MD   Chief Complaint  Patient presents with   Fall   Leg Injury   Brief Narrative:  Patient is 86yo patient with CAD, hyperlipidemia, Quadarius Henton. fib on warfarin, GERD, CKD stage III, Parkinson's disease presented to ED after sustaining [name] fall at home.  History per his wife, patient had gotten out of the shower and while he was in the bathroom, he fell and landed on the right side of his face, right hip.  Patient sustained facial laceration with scalp contusion.  He also complained of right hip pain with difficulty in being able to bear weight on the right leg due to pain.  EMS was called. In ED BP was soft-2/52, and leukocytosis with normocytic anemia.  Creatinine 1.4.  CT imaging showed no acute intracranial abnormality but showed right frontal/supraorbital swelling and laceration extending to the temporal soft tissues, mild right liver contusion and laceration.  No facial bone fracture. Hip x-ray showed right hip fracture Patient was transferred to [facility] for further work-up  Assessment & Plan:   Principal Problem:   Closed right hip fracture, initial encounter [facility]) Active Problems:   Hyperlipidemia   CKD (chronic kidney disease)   CAD (coronary artery disease)   Atrial fibrillation with RVR ([place])   Diabetes mellitus without complication (HCC)   GERD (gastroesophageal reflux disease)   Parkinson's disease ([place])   Facial laceration   Leukocytosis   Hyperglycemia   Fall at home, initial encounter  Aspiration Pneumonia   Shortness of Breath   Volume Overloaded CXR 11/23 with worsening airspace disease in LLL, concerning for pneumonia vs aspiration 11/25 CXR with persistent L mid and L lower lung airspace disease, new airspace densities within the RU and RL lung CXR 11/26 with multifocal pneumonia, worsened in the R lung Continues on RA, tachypneic.  Leukocytosis slightly worse  today again.  Will continue to monitor on zosyn.   11/29 CXR with progressive multifocal pulm infiltrates c/w multifocal pneumonia.  Suspected superimposed mild persistent cardiogenic pulm edema.  Continue antibiotics -> zosyn (11/25 - present).  Plan for 7 days of abx. Will give dose of lasix today and follow  Blood cx NG SLP eval with mild/moderate aspiration risk -> dysphagia 1 diet, nectar thick liquid  Goals of care: appreciate palliative care assistance.  Pt DNR after conversation with palliative care.  Will continue to monitor course.  Persistent pneumonia and tachypnea.  Pseudomonas UTI Urine cx with pansensitive pseudomonas Continue zosyn  Acute Metabolic Encephalopathy: suspect 2/2 acute hospitalization, pain, recent surgery, aspiration pneumonia Repeat head CT 11/24 without acute intracranial abnormality Delirium precautions  Treat aspiration pneumonia and pseudomonas UTI Gradually improving  Acute comminuted closed right hip fracture, after mechanical fall at home [facility]) -Right hip x-ray showed acute comminuted intertrochanteric fracture of the right hip with superior subluxation -Underwent intramedullary fixation of the right femur (Dr [name]), on 45/80 -Complicated by anemia and [name]. fib with RVR on 11/22, doing well today, will start PT - Continue warfarin for DVT ppx - therapy recommending SNF  Atrial fibrillation with RVR -Postop in atrial fibrillation with RVR on 11/22 requiring digoxin, IV Cardizem. Resumed oral Cardizem 240 mg daily -> transitioned to Q6 due to need to crush meds -Coumadin per pharmacy, cardiology now signed off  Acute on chronic blood loss anemia, postop -Hemoglobin 7.2 on 11/22, status post 2 units packed RBC transfusion - transfuse 1 unit pRBC  11/28 - Relatively stable, labs with iron def anemia - PO iron   Right frontal/supraorbital swelling and laceration -Continue wound care, no active bleeding  Leukocytosis  Treated for aspiration  pneumonia above  Diabetes mellitus type 2, diet controlled -Hemoglobin A1c 6.5 08/09/2019 -Continue carb modified diet  CKD stageIIIa   NAGMA -Baseline creatinine ~1.5 -Avoid nephrotoxic medications, hypotension - NAGMA, will continue to monitor, likely 2/2 CKD and IVF - holding topamax for now - start PO bicarb   Elevated LFTs:  Continue to monitor  CAD, hyperlipidemia Continue Crestor  GERD -Continue Protonix   Parkinson's disease Continue Sinemet  Hyperglycemia: ssi, follow A1c  Underweight, protein calorie malnutrition Estimated body mass index is 18.62 kg/m as calculated from the following:   Height as of this encounter: 6\' 2"  (1.88 m).   Weight as of this encounter: 65.8 kg.  Nutritional supplements  DVT prophylaxis: warfarin Code Status: DNR Family Communication:  Wife at bedside Disposition:   Status is: Inpatient  Remains inpatient appropriate because:Inpatient level of care appropriate due to severity of illness   Dispo: The patient is from: Home              Anticipated d/c is to: SNF              Anticipated d/c date is: > 3 days              Patient currently is not medically stable to d/c.       Consultants:   Palliative care  Cardiology  ortho  Procedures:  intramedullary fixation of the right femur (Dr [name]), on 11/21  Antimicrobials:  Anti-infectives (From admission, onward)   Start     Dose/Rate Route Frequency Ordered Stop   01/14/20 0830  piperacillin-tazobactam (ZOSYN) IVPB 3.375 g        3.375 g 12.5 mL/hr over 240 Minutes Intravenous Every 8 hours 01/14/20 0806     01/12/20 2100  vancomycin (VANCOCIN) IVPB 1000 mg/200 mL premix        1,000 mg 200 mL/hr over 60 Minutes Intravenous  Once 01/12/20 2001 01/13/20 0548   01/12/20 2030  cefTRIAXone (ROCEPHIN) 1 g in sodium chloride 0.9 % 100 mL IVPB  Status:  Discontinued        1 g 200 mL/hr over 30 Minutes Intravenous Every 24 hours 01/12/20 1930 01/14/20 0756    12/21/2019 1500  ceFAZolin (ANCEF) IVPB 2g/100 mL premix        2 g 200 mL/hr over 30 Minutes Intravenous Every 6 hours 01/08/2020 1113 12/22/2019 2141     Subjective: Denies complaints  Objective: Vitals:   01/18/20 0531 01/18/20 1155 01/18/20 1248 01/18/20 1316  BP:  123/69 (!) 120/55   Pulse: 79 86 88   Resp: (!) 22 20 16    Temp:    98.6 F (37 C)  TempSrc:    Oral  SpO2:  96% 95%   Weight:      Height:        Intake/Output Summary (Last 24 hours) at 01/18/2020 1611 Last data filed at 01/18/2020 1502 Gross per 24 hour  Intake 854.92 ml  Output 1050 ml  Net -195.08 ml   Filed Weights   12/29/2019 2309 01/07/2020 2312  Weight: 61.2 kg 65.8 kg    Examination:  General: No acute distress. Cardiovascular: Heart sounds show Ahjanae Cassel regular rate, and rhythm. Lungs: coarse breath sounds Abdomen: Soft, nontender, nondistended  Neurological: Alert and oriented 3. Moves all extremities 4. Cranial nerves II  through XII grossly intact. Skin: Warm and dry. No rashes or lesions. Extremities: RLE dressing intact     Data Reviewed: I have personally reviewed following labs and imaging studies  CBC: Recent Labs  Lab 01/14/20 0416 01/15/20 0450 01/16/20 0453 01/17/20 0558 01/18/20 0419  WBC 12.1* 11.8* 13.8* 14.6* 15.4*  NEUTROABS 9.6*  --  10.8* 11.2* 12.0*  HGB 7.3* 7.4* 7.4* 7.0* 7.4*  HCT 22.8* 23.0* 23.2* 21.7* 23.2*  MCV 93.1 93.9 92.8 93.5 92.1  PLT 152 165 197 233 947    Basic Metabolic Panel: Recent Labs  Lab 01/14/20 0416 01/15/20 0450 01/16/20 0453 01/17/20 0558 01/18/20 0419  NA 136 134* 138 138 140  K 3.7 3.7 3.9 4.0 3.8  CL 107 108 111 111 111  CO2 20* 17* 17* 17* 18*  GLUCOSE 112* 137* 127* 136* 151*  BUN 26* 26* 27* 29* 29*  CREATININE 1.29* 1.27* 1.27* 1.47* 1.41*  CALCIUM 8.0* 7.8* 7.8* 7.9* 7.8*  MG 2.1 2.0 2.2 2.2 2.2  PHOS 3.1 2.5 2.7 2.7 2.7    GFR: Estimated Creatinine Clearance: 34.4 mL/min (Surya Folden) (by C-G formula based on SCr of 1.41 mg/dL  (H)).  Liver Function Tests: Recent Labs  Lab 01/14/20 0416 01/15/20 0450 01/16/20 0453 01/17/20 0558 01/18/20 0419  AST 36 40 44* 59* 52*  ALT 19 12 12 16 21   ALKPHOS 70 94 113 139* 140*  BILITOT 1.0 1.2 1.4* 1.5* 1.7*  PROT 6.1* 6.0* 6.3* 6.4* 6.2*  ALBUMIN 2.6* 2.4* 2.5* 2.4* 2.4*    CBG: Recent Labs  Lab 01/11/20 2110  GLUCAP 178*     Recent Results (from the past 240 hour(s))  Resp Panel by RT-PCR (Flu [place] Gentle&B, Covid) Nasopharyngeal Swab     Status: None   Collection Time: 12/23/2019 11:50 PM   Specimen: Nasopharyngeal Swab; Nasopharyngeal(NP) swabs in vial transport medium  Result Value Ref Range Status   SARS Coronavirus 2 by RT PCR NEGATIVE NEGATIVE Final    Comment: (NOTE) SARS-CoV-2 target nucleic acids are NOT DETECTED.  The SARS-CoV-2 RNA is generally detectable in upper respiratory specimens during the acute phase of infection. The lowest concentration of SARS-CoV-2 viral copies this assay can detect is 138 copies/mL. Antwon Rochin negative result does not preclude SARS-Cov-2 infection and should not be used as the sole basis for treatment or other patient management decisions. Ronrico Dupin negative result may occur with  improper specimen collection/handling, submission of specimen other than nasopharyngeal swab, presence of viral mutation(s) within the areas targeted by this assay, and inadequate number of viral copies(<138 copies/mL). Soley Harriss negative result must be combined with clinical observations, patient history, and epidemiological information. The expected result is Negative.  Fact Sheet for Patients:  EntrepreneurPulse.com.au  Fact Sheet for Healthcare Providers:  IncredibleEmployment.be  This test is no t yet approved or cleared by the [place] FDA and  has been authorized for detection and/or diagnosis of SARS-CoV-2 by FDA under an Emergency Use Authorization (EUA). This EUA will remain  in effect (meaning this test can be used)  for the duration of the COVID-19 declaration under Section 564(b)(1) of the Act, 21 U.S.C.section 360bbb-3(b)(1), unless the authorization is terminated  or revoked sooner.       Influenza Kaidyn Hernandes by PCR NEGATIVE NEGATIVE Final   Influenza B by PCR NEGATIVE NEGATIVE Final    Comment: (NOTE) The Xpert Xpress SARS-CoV-2/FLU/RSV plus assay is intended as an aid in the diagnosis of influenza from Nasopharyngeal swab specimens and should not be used as Adorian Gwynne  sole basis for treatment. Nasal washings and aspirates are unacceptable for Xpert Xpress SARS-CoV-2/FLU/RSV testing.  Fact Sheet for Patients: EntrepreneurPulse.com.au  Fact Sheet for Healthcare Providers: IncredibleEmployment.be  This test is not yet approved or cleared by the [place] FDA and has been authorized for detection and/or diagnosis of SARS-CoV-2 by FDA under an Emergency Use Authorization (EUA). This EUA will remain in effect (meaning this test can be used) for the duration of the COVID-19 declaration under Section 564(b)(1) of the Act, 21 U.S.C. section 360bbb-3(b)(1), unless the authorization is terminated or revoked.  Performed at [facility], [address], [place], [place] [address]   Surgical pcr screen     Status: None   Collection Time: 12/29/2019  9:02 PM   Specimen: Nasal Mucosa; Nasal Swab  Result Value Ref Range Status   MRSA, PCR NEGATIVE NEGATIVE Final   Staphylococcus aureus NEGATIVE NEGATIVE Final    Comment: (NOTE) The Xpert SA Assay (FDA approved for NASAL specimens in patients 71 years of age and older), is one component of Collis Thede comprehensive surveillance program. It is not intended to diagnose infection nor to guide or monitor treatment. Performed at [facility], [place] [address] [place], [place] [address]   Culture, blood (routine x 2)     Status: None   Collection Time: 01/12/20  7:21 PM   Specimen: BLOOD  Result Value Ref Range Status    Specimen Description   Final    BLOOD RIGHT ANTECUBITAL Performed at [place] [address], [place], [place] [address]    Special Requests   Final    BOTTLES DRAWN AEROBIC AND ANAEROBIC Blood Culture adequate volume Performed at [place] [address], [place], [place] [address]    Culture   Final    NO GROWTH 5 DAYS Performed at [facility], [place] [address], [place], [place] [address]    Report Status 01/17/2020 FINAL  Final  Culture, blood (routine x 2)     Status: None   Collection Time: 01/12/20  7:22 PM   Specimen: BLOOD  Result Value Ref Range Status   Specimen Description   Final    BLOOD LEFT ARM Performed at [facility] [place] [address], [place], [place] [address]    Special Requests   Final    BOTTLES DRAWN AEROBIC ONLY Blood Culture adequate volume Performed at [place] [address], [place], [place] [address]    Culture   Final    NO GROWTH 5 DAYS Performed at [facility], [place] [address] [place], [place] [address]    Report Status 01/17/2020 FINAL  Final  Culture, Urine     Status: Abnormal   Collection Time: 01/13/20  1:20 AM   Specimen: Urine, Random  Result Value Ref Range Status   Specimen Description   Final    URINE, RANDOM Performed at [place] [address] [place], [place] [address]    Special Requests   Final    NONE Performed at [facility], [place] [address] [place], [place] [address]    Culture 50,000 COLONIES/mL PSEUDOMONAS AERUGINOSA (Brandy Kabat)  Final   Report Status 01/15/2020 FINAL  Final   Organism ID, Bacteria PSEUDOMONAS AERUGINOSA (Korinna Tat)  Final      Susceptibility   Pseudomonas aeruginosa - MIC*    CEFTAZIDIME 2 SENSITIVE Sensitive     CIPROFLOXACIN 0.5 SENSITIVE Sensitive     GENTAMICIN <=  1 SENSITIVE Sensitive     IMIPENEM 2 SENSITIVE Sensitive     PIP/TAZO <=4 SENSITIVE Sensitive      CEFEPIME 2 SENSITIVE Sensitive     * 50,000 COLONIES/mL PSEUDOMONAS AERUGINOSA         Radiology Studies: DG CHEST PORT 1 VIEW  Result Date: 01/18/2020 CLINICAL DATA:  Pneumonia EXAM: PORTABLE CHEST 1 VIEW COMPARISON:  01/15/2020 FINDINGS: The lungs are symmetrically well expanded. Multifocal pulmonary consolidation has progressed in the interval since prior examination, particularly within the right upper lobe, most in keeping with changes of multifocal pneumonia. Superimposed mild probable cardiogenic pulmonary edema persists with perihilar interstitial pulmonary infiltrate and small bilateral pleural effusions. No pneumothorax. Coronary artery bypass grafting and aortic valve replacement has been performed. Cardiac size within normal limits. No acute bone abnormality. IMPRESSION: Progressive multifocal pulmonary infiltrates in keeping with multifocal pneumonia. Suspected superimposed mild persistent cardiogenic pulmonary edema Electronically Signed   By: Fidela Salisbury MD   On: 01/18/2020 07:14        Scheduled Meds:  sodium chloride   Intravenous Once   carbidopa-levodopa  1 tablet Oral TID   diltiazem  60 mg Oral Q6H   docusate  100 mg Oral BID   feeding supplement  237 mL Oral BID BM   ferrous sulfate  325 mg Oral Q breakfast   gabapentin  100 mg Oral QHS   ketotifen  1 drop Both Eyes Daily   lidocaine   Inhalation QHS   multivitamin with minerals   Oral Daily   nystatin  5 mL Oral QID   pantoprazole sodium  40 mg Oral Daily   rosuvastatin  2.5 mg Oral QODAY   sodium bicarbonate  650 mg Oral BID   warfarin  1 mg Oral ONCE-1600   Warfarin - Pharmacist Dosing Inpatient   Does not apply q1600   Continuous Infusions:  piperacillin-tazobactam (ZOSYN)  IV 3.375 g (01/18/20 1432)     LOS: 9 days    Time spent: over 30 min    Fayrene Helper, MD [facility]   To contact the attending provider between 7A-7P or the covering provider during after  hours 7P-7A, please log into the web site [email or web address] and access using universal Ray password for that web site. If you do not have the password, please call the hospital operator.  01/18/2020, 4:11 PM

## 2020-01-19 ENCOUNTER — Inpatient Hospital Stay (HOSPITAL_COMMUNITY): Payer: Medicare Other

## 2020-01-19 LAB — COMPREHENSIVE METABOLIC PANEL
ALT: 27 U/L (ref 0–44)
AST: 52 U/L — ABNORMAL HIGH (ref 15–41)
Albumin: 2.4 g/dL — ABNORMAL LOW (ref 3.5–5.0)
Alkaline Phosphatase: 146 U/L — ABNORMAL HIGH (ref 38–126)
Anion gap: 10 (ref 5–15)
BUN: 34 mg/dL — ABNORMAL HIGH (ref 8–23)
CO2: 19 mmol/L — ABNORMAL LOW (ref 22–32)
Calcium: 7.8 mg/dL — ABNORMAL LOW (ref 8.9–10.3)
Chloride: 111 mmol/L (ref 98–111)
Creatinine, Ser: 1.42 mg/dL — ABNORMAL HIGH (ref 0.61–1.24)
GFR, Estimated: 48 mL/min — ABNORMAL LOW (ref >60)
Glucose, Bld: 148 mg/dL — ABNORMAL HIGH (ref 70–99)
Potassium: 3.7 mmol/L (ref 3.5–5.1)
Sodium: 140 mmol/L (ref 135–145)
Total Bilirubin: 1.6 mg/dL — ABNORMAL HIGH (ref 0.3–1.2)
Total Protein: 6.1 g/dL — ABNORMAL LOW (ref 6.5–8.1)

## 2020-01-19 LAB — CBC WITH DIFFERENTIAL/PLATELET
Abs Immature Granulocytes: 0.16 10*3/uL — ABNORMAL HIGH (ref 0.00–0.07)
Basophils Absolute: 0.1 10*3/uL (ref 0.0–0.1)
Basophils Relative: 0 %
Eosinophils Absolute: 0.1 10*3/uL (ref 0.0–0.5)
Eosinophils Relative: 1 %
HCT: 20 % — ABNORMAL LOW (ref 39.0–52.0)
Hemoglobin: 6.4 g/dL — CL (ref 13.0–17.0)
Immature Granulocytes: 1 %
Lymphocytes Relative: 9 %
Lymphs Abs: 1.4 10*3/uL (ref 0.7–4.0)
MCH: 29.4 pg (ref 26.0–34.0)
MCHC: 32 g/dL (ref 30.0–36.0)
MCV: 91.7 fL (ref 80.0–100.0)
Monocytes Absolute: 2.1 10*3/uL — ABNORMAL HIGH (ref 0.1–1.0)
Monocytes Relative: 13 %
Neutro Abs: 12.4 10*3/uL — ABNORMAL HIGH (ref 1.7–7.7)
Neutrophils Relative %: 76 %
Platelets: 258 10*3/uL (ref 150–400)
RBC: 2.18 MIL/uL — ABNORMAL LOW (ref 4.22–5.81)
RDW: 14.7 % (ref 11.5–15.5)
WBC: 16.3 10*3/uL — ABNORMAL HIGH (ref 4.0–10.5)
nRBC: 0 % (ref 0.0–0.2)

## 2020-01-19 LAB — HEMOGLOBIN AND HEMATOCRIT, BLOOD
HCT: 25.1 % — ABNORMAL LOW (ref 39.0–52.0)
Hemoglobin: 8.3 g/dL — ABNORMAL LOW (ref 13.0–17.0)

## 2020-01-19 LAB — GLUCOSE, CAPILLARY
Glucose-Capillary: 141 mg/dL — ABNORMAL HIGH (ref 70–99)
Glucose-Capillary: 124 mg/dL — ABNORMAL HIGH (ref 70–99)
Glucose-Capillary: 154 mg/dL — ABNORMAL HIGH (ref 70–99)
Glucose-Capillary: 122 mg/dL — ABNORMAL HIGH (ref 70–99)

## 2020-01-19 LAB — MAGNESIUM: Magnesium: 2.3 mg/dL (ref 1.7–2.4)

## 2020-01-19 LAB — PROTIME-INR
INR: 2.1 — ABNORMAL HIGH (ref 0.8–1.2)
Prothrombin Time: 23.2 seconds — ABNORMAL HIGH (ref 11.4–15.2)

## 2020-01-19 LAB — PREPARE RBC (CROSSMATCH)

## 2020-01-19 LAB — PHOSPHORUS: Phosphorus: 2.7 mg/dL (ref 2.5–4.6)

## 2020-01-19 MED ORDER — PANTOPRAZOLE SODIUM 40 MG IV SOLR: 40.0000 mg | Freq: Two times a day (BID) | INTRAVENOUS | Status: DC

## 2020-01-19 MED ORDER — SODIUM CHLORIDE 0.9% IV SOLUTION: Freq: Once | INTRAVENOUS | Status: DC

## 2020-01-19 MED ORDER — SODIUM CHLORIDE 0.9 % IV SOLN
8.0000 mg/h | INTRAVENOUS | Status: DC
  Filled 2020-01-19: qty 80

## 2020-01-19 MED ORDER — WARFARIN SODIUM 1 MG PO TABS
1.0000 mg | ORAL_TABLET | Freq: Once | ORAL | Status: DC
  Filled 2020-01-19: qty 1

## 2020-01-19 MED ORDER — PANTOPRAZOLE SODIUM 40 MG PO PACK
40.0000 mg | PACK | Freq: Two times a day (BID) | ORAL | Status: DC
  Administered 2020-01-19 – 2020-01-23 (×9): 40 mg via ORAL
  Filled 2020-01-19 (×13): qty 20

## 2020-01-19 MED ORDER — SODIUM CHLORIDE 0.9 % IV SOLN
80.0000 mg | Freq: Once | INTRAVENOUS | Status: DC
  Filled 2020-01-19: qty 80

## 2020-01-19 NOTE — Progress Notes (Signed)
2nd PIV site placed per Primary RN request. Educated RN that running IV antibiotics and Blood at the same time is not recommended due to if there is a reaction would be difficult to know what the patient is reacting to.

## 2020-01-19 NOTE — Progress Notes (Signed)
Patient presented with confusion and was trying to get out of bed to find his wife. Patient tires out quickly then closes his eyes to rest. Administered 1 unit of blood, pt tolerated blood well. During blood administration, patient began to become more alert. Jerene Pitch

## 2020-01-19 NOTE — Progress Notes (Signed)
PROGRESS NOTE    Robert Richard  KKX:[id number] DOB: [birth year] DOA: 01/11/2020 PCP: [name], MD   Chief Complaint  Patient presents with  . Fall  . Leg Injury   Brief Narrative:  Patient is 86yo patient with CAD, hyperlipidemia, [name]. fib on warfarin, GERD, CKD stage III, Parkinson's disease presented to ED after sustaining [name] fall at home.  History per his wife, patient had gotten out of the shower and while he was in the bathroom, he fell and landed on the right side of his face, right hip.  Patient sustained facial laceration with scalp contusion.  He also complained of right hip pain with difficulty in being able to bear weight on the right leg due to pain.  EMS was called. In ED BP was soft-2/52, and leukocytosis with normocytic anemia.  Creatinine 1.4.  CT imaging showed no acute intracranial abnormality but showed right frontal/supraorbital swelling and laceration extending to the temporal soft tissues, mild right liver contusion and laceration.  No facial bone fracture. Hip x-ray showed right hip fracture  He's now s/p surgery with ortho.  Post op course has been complicated by aspiration pneumonia, pseudomonas UTI, anemia requiring transfusion, and delirium.  Palliative care following.  Assessment & Plan:   Principal Problem:   Closed right hip fracture, initial encounter ([place]) Active Problems:   Hyperlipidemia   CKD (chronic kidney disease)   CAD (coronary artery disease)   Atrial fibrillation with RVR (HCC)   Diabetes mellitus without complication (HCC)   GERD (gastroesophageal reflux disease)   Parkinson's disease ([place])   Facial laceration   Leukocytosis   Hyperglycemia   Fall at home, initial encounter  Goals of care: appreciate palliative care assistance.  Pt DNR after conversation with palliative care.  Will continue to monitor course.  Persistent pneumonia, aspiration.  Encephalopathy is slightly worse today.  Discussed with wife continued encephalopathy,  aspiration and poor prognosis - I brought up possibility of hospice if he doesn't improve.  Will continue to monitor on abx and with pRBC transfusion today.  Continue goals of care discussions.  Palliative care following, appreciate recs  Aspiration Pneumonia  Shortness of Breath  Volume Overloaded CXR 11/23 with worsening airspace disease in LLL, concerning for pneumonia vs aspiration 11/25 CXR with persistent L mid and L lower lung airspace disease, new airspace densities within the RU and RL lung CXR 11/26 with multifocal pneumonia, worsened in the R lung Continues on RA, tachypneic.  Leukocytosis slightly worse today again.  11/30 CXR with persistent bilateral upper lung zone predominant air space opacities, stable R effusion Continue antibiotics -> zosyn (11/25 - present).  Plan for 7 days of abx. Continue daily lasix for now, elevated BNP Blood cx NG SLP eval with mild/moderate aspiration risk -> dysphagia 1 diet, nectar thick liquid  Acute on chronic blood loss anemia, postop -Hemoglobin 7.2 on 11/22, status post 2 units packed RBC transfusion - transfuse 1 unit pRBC 11/28 - transfuse 1 unit pRBC on 11/30 - hold warfarin for now, follow  - Relatively stable, labs with iron def anemia - PO iron   Pseudomonas UTI Urine cx with pansensitive pseudomonas Continue zosyn  Acute Metabolic Encephalopathy: suspect 2/2 acute hospitalization, pain, recent surgery, aspiration pneumonia Repeat head CT 11/24 without acute intracranial abnormality Delirium precautions  Treat aspiration pneumonia and pseudomonas UTI Fluctuating, continue to monitor   Acute comminuted closed right hip fracture, after mechanical fall at home [name]) -Right hip x-ray showed acute comminuted intertrochanteric fracture of  the right hip with superior subluxation -Underwent intramedullary fixation of the right femur (Dr [name]), on 70/35 -Complicated by anemia and Cindel Daugherty. fib with RVR on 11/22, doing well today, will  start PT - Holding warfarin now - follow for resumption  - therapy recommending SNF  Atrial fibrillation with RVR -Postop in atrial fibrillation with RVR on 11/22 requiring digoxin, IV Cardizem. Resumed oral Cardizem 240 mg daily -> transitioned to Q6 due to need to crush meds -coumadin on hold with need for recurrent transfusion - follow for resumption   Right frontal/supraorbital swelling and laceration -Continue wound care, no active bleeding  Leukocytosis  Rising   Diabetes mellitus type 2, diet controlled -Hemoglobin A1c 6.5 08/09/2019 -Continue carb modified diet  CKD stageIIIa  NAGMA -Baseline creatinine ~1.5 -Avoid nephrotoxic medications, hypotension - NAGMA, will continue to monitor, likely 2/2 CKD and IVF - holding topamax for now (per discussion with pharm) - start PO bicarb   Elevated LFTs:  Continue to monitor  CAD, hyperlipidemia Continue Crestor  GERD -Continue Protonix   Parkinson's disease Continue Sinemet  Hyperglycemia: ssi, follow A1c (5.5)  Elevated Bili  LFTs: mild, continue to monitor  Underweight, protein calorie malnutrition Estimated body mass index is 18.62 kg/m as calculated from the following:   Height as of this encounter: 6\' 2"  (1.88 m).   Weight as of this encounter: 65.8 kg.  Nutritional supplements  DVT prophylaxis: warfarin Code Status: DNR Family Communication:  Wife at bedside Disposition:   Status is: Inpatient  Remains inpatient appropriate because:Inpatient level of care appropriate due to severity of illness   Dispo: The patient is from: Home              Anticipated d/c is to: SNF              Anticipated d/c date is: > 3 days              Patient currently is not medically stable to d/c.       Consultants:   Palliative care  Cardiology  ortho  Procedures:  intramedullary fixation of the right femur (Dr [name]), on 11/21  Antimicrobials:  Anti-infectives (From admission, onward)    Start     Dose/Rate Route Frequency Ordered Stop   01/14/20 0830  piperacillin-tazobactam (ZOSYN) IVPB 3.375 g        3.375 g 12.5 mL/hr over 240 Minutes Intravenous Every 8 hours 01/14/20 0806 01/20/20 2359   01/12/20 2100  vancomycin (VANCOCIN) IVPB 1000 mg/200 mL premix        1,000 mg 200 mL/hr over 60 Minutes Intravenous  Once 01/12/20 2001 01/13/20 0548   01/12/20 2030  cefTRIAXone (ROCEPHIN) 1 g in sodium chloride 0.9 % 100 mL IVPB  Status:  Discontinued        1 g 200 mL/hr over 30 Minutes Intravenous Every 24 hours 01/12/20 1930 01/14/20 0756   12/30/2019 1500  ceFAZolin (ANCEF) IVPB 2g/100 mL premix        2 g 200 mL/hr over 30 Minutes Intravenous Every 6 hours 12/29/2019 1113 12/23/2019 2141     Subjective: Denies complaints Confused today, worse than yesterday  Objective: Vitals:   01/19/20 1206 01/19/20 1251 01/19/20 1603 01/19/20 1702  BP: (!) 124/49 (!) 120/47 121/64 (!) 123/55  Pulse: 76 71 82   Resp: 18 20    Temp: 99.4 F (37.4 C) 98.4 F (36.9 C) 98.9 F (37.2 C)   TempSrc: Oral Oral Oral   SpO2: 91%  95%  Weight:      Height:        Intake/Output Summary (Last 24 hours) at 01/19/2020 1828 Last data filed at 01/19/2020 1638 Gross per 24 hour  Intake 547.17 ml  Output --  Net 547.17 ml   Filed Weights   01/13/2020 2309 01/16/2020 2312 01/19/20 0500  Weight: 61.2 kg 65.8 kg 65.8 kg    Examination:  General: No acute distress. Cardiovascular: Heart sounds show Fergie Sherbert regular rate, and rhythm. Lungs: coarse breath sounds  Abdomen: Soft, nontender, nondistended Neurological: Alert and oriented 3. Moves all extremities 4. Cranial nerves II through XII grossly intact. Skin: Warm and dry. No rashes or lesions. Extremities: RLE with intact dressing - edematous, no notable echymoses, soft      Data Reviewed: I have personally reviewed following labs and imaging studies  CBC: Recent Labs  Lab 01/14/20 0416 01/14/20 0416 01/15/20 0450 01/16/20 0453  01/17/20 0558 01/18/20 0419 01/19/20 0514  WBC 12.1*   < > 11.8* 13.8* 14.6* 15.4* 16.3*  NEUTROABS 9.6*  --   --  10.8* 11.2* 12.0* 12.4*  HGB 7.3*   < > 7.4* 7.4* 7.0* 7.4* 6.4*  HCT 22.8*   < > 23.0* 23.2* 21.7* 23.2* 20.0*  MCV 93.1   < > 93.9 92.8 93.5 92.1 91.7  PLT 152   < > 165 197 233 251 258   < > = values in this interval not displayed.    Basic Metabolic Panel: Recent Labs  Lab 01/15/20 0450 01/16/20 0453 01/17/20 0558 01/18/20 0419 01/19/20 0514  NA 134* 138 138 140 140  K 3.7 3.9 4.0 3.8 3.7  CL 108 111 111 111 111  CO2 17* 17* 17* 18* 19*  GLUCOSE 137* 127* 136* 151* 148*  BUN 26* 27* 29* 29* 34*  CREATININE 1.27* 1.27* 1.47* 1.41* 1.42*  CALCIUM 7.8* 7.8* 7.9* 7.8* 7.8*  MG 2.0 2.2 2.2 2.2 2.3  PHOS 2.5 2.7 2.7 2.7 2.7    GFR: Estimated Creatinine Clearance: 34.1 mL/min (Kerstie Agent) (by C-G formula based on SCr of 1.42 mg/dL (H)).  Liver Function Tests: Recent Labs  Lab 01/15/20 0450 01/16/20 0453 01/17/20 0558 01/18/20 0419 01/19/20 0514  AST 40 44* 59* 52* 52*  ALT 12 12 16 21 27   ALKPHOS 94 113 139* 140* 146*  BILITOT 1.2 1.4* 1.5* 1.7* 1.6*  PROT 6.0* 6.3* 6.4* 6.2* 6.1*  ALBUMIN 2.4* 2.5* 2.4* 2.4* 2.4*    CBG: Recent Labs  Lab 01/18/20 1702 01/18/20 2103 01/19/20 0736 01/19/20 1204 01/19/20 1627  GLUCAP 153* 175* 141* 124* 154*     Recent Results (from the past 240 hour(s))  Surgical pcr screen     Status: None   Collection Time: 12/30/2019  9:02 PM   Specimen: Nasal Mucosa; Nasal Swab  Result Value Ref Range Status   MRSA, PCR NEGATIVE NEGATIVE Final   Staphylococcus aureus NEGATIVE NEGATIVE Final    Comment: (NOTE) The Xpert SA Assay (FDA approved for NASAL specimens in patients 25 years of age and older), is one component of Bethene Hankinson comprehensive surveillance program. It is not intended to diagnose infection nor to guide or monitor treatment. Performed at [facility], [place] [address], [place], [place] [address]     Culture, blood (routine x 2)     Status: None   Collection Time: 01/12/20  7:21 PM   Specimen: BLOOD  Result Value Ref Range Status   Specimen Description   Final    BLOOD RIGHT ANTECUBITAL Performed  at [facility], [place] [address] [place], [place] [address]    Special Requests   Final    BOTTLES DRAWN AEROBIC AND ANAEROBIC Blood Culture adequate volume Performed at [place] [address], [place], [place] [address]    Culture   Final    NO GROWTH 5 DAYS Performed at [facility], [place] [address] [place], [place] [address]    Report Status 01/17/2020 FINAL  Final  Culture, blood (routine x 2)     Status: None   Collection Time: 01/12/20  7:22 PM   Specimen: BLOOD  Result Value Ref Range Status   Specimen Description   Final    BLOOD LEFT ARM Performed at [place] [address], [place], [place] [address]    Special Requests   Final    BOTTLES DRAWN AEROBIC ONLY Blood Culture adequate volume Performed at [place] [address] [place], [place] [address]    Culture   Final    NO GROWTH 5 DAYS Performed at [facility], [place] [address] [place], [place] [address]    Report Status 01/17/2020 FINAL  Final  Culture, Urine     Status: Abnormal   Collection Time: 01/13/20  1:20 AM   Specimen: Urine, Random  Result Value Ref Range Status   Specimen Description   Final    URINE, RANDOM Performed at [place] [address] [place], [place] [address]    Special Requests   Final    NONE Performed at [facility], [place] [address], [place], [place] [address]    Culture 50,000 COLONIES/mL PSEUDOMONAS AERUGINOSA (Royetta Probus)  Final   Report Status 01/15/2020 FINAL  Final   Organism ID, Bacteria PSEUDOMONAS AERUGINOSA (Sidda Humm)  Final      Susceptibility   Pseudomonas aeruginosa - MIC*    CEFTAZIDIME 2 SENSITIVE Sensitive     CIPROFLOXACIN 0.5  SENSITIVE Sensitive     GENTAMICIN <=1 SENSITIVE Sensitive     IMIPENEM 2 SENSITIVE Sensitive     PIP/TAZO <=4 SENSITIVE Sensitive     CEFEPIME 2 SENSITIVE Sensitive     * 50,000 COLONIES/mL PSEUDOMONAS AERUGINOSA         Radiology Studies: DG CHEST PORT 1 VIEW  Result Date: 01/19/2020 CLINICAL DATA:  Cough.  Pneumonia. EXAM: PORTABLE CHEST 1 VIEW COMPARISON:  01/18/2020 FINDINGS: Stable cardiac enlargement. Aortic atherosclerosis. Previous median sternotomy, CABG procedure, and aortic valve repair. Unchanged right pleural effusion with veil like opacification of the right lower lobe. The left pleural effusion appears decreased from previous exam. No significant change in bilateral upper lung zone predominant airspace opacities. IMPRESSION: 1. Persistent bilateral upper lung zone predominant airspace opacities. 2. Stable right pleural effusion. 3. Decrease in left pleural effusion. Electronically Signed   By: Kerby Moors M.D.   On: 01/19/2020 10:42   DG CHEST PORT 1 VIEW  Result Date: 01/18/2020 CLINICAL DATA:  Pneumonia EXAM: PORTABLE CHEST 1 VIEW COMPARISON:  01/15/2020 FINDINGS: The lungs are symmetrically well expanded. Multifocal pulmonary consolidation has progressed in the interval since prior examination, particularly within the right upper lobe, most in keeping with changes of multifocal pneumonia. Superimposed mild probable cardiogenic pulmonary edema persists with perihilar interstitial pulmonary infiltrate and small bilateral pleural effusions. No pneumothorax. Coronary artery bypass grafting and aortic valve replacement has been performed. Cardiac size within normal limits. No acute bone abnormality. IMPRESSION: Progressive multifocal pulmonary infiltrates in keeping with multifocal  pneumonia. Suspected superimposed mild persistent cardiogenic pulmonary edema Electronically Signed   By: Fidela Salisbury MD   On: 01/18/2020 07:14        Scheduled Meds: . sodium chloride    Intravenous Once  . carbidopa-levodopa  1 tablet Oral TID  . diltiazem  60 mg Oral Q6H  . docusate  100 mg Oral BID  . feeding supplement  237 mL Oral BID BM  . ferrous sulfate  325 mg Oral Q breakfast  . furosemide  20 mg Intravenous Daily  . insulin aspart  0-9 Units Subcutaneous TID WC  . ketotifen  1 drop Both Eyes Daily  . lidocaine   Inhalation QHS  . multivitamin with minerals   Oral Daily  . nystatin  5 mL Oral QID  . pantoprazole sodium  40 mg Oral BID  . rosuvastatin  2.5 mg Oral QODAY  . sodium bicarbonate  650 mg Oral BID   Continuous Infusions: . piperacillin-tazobactam (ZOSYN)  IV 3.375 g (01/19/20 1321)     LOS: 10 days    Time spent: over 30 min    Fayrene Helper, MD [facility]   To contact the attending provider between 7A-7P or the covering provider during after hours 7P-7A, please log into the web site [email or web address] and access using universal [place] password for that web site. If you do not have the password, please call the hospital operator.  01/19/2020, 6:28 PM

## 2020-01-19 NOTE — Progress Notes (Addendum)
[place] for warfarin Indication: atrial fibrillation and AVR  Allergies  Allergen Reactions  . Iodinated Diagnostic Agents Hives    Other reaction(s): RASH Other reaction(s): RASH  . Ioxaglate Hives    Other reaction(s): RASH    Patient Measurements: Height: 6\' 2"  (188 cm) Weight: 65.8 kg (145 lb 1 oz) IBW/kg (Calculated) : 82.2 Heparin Dosing Weight:   Vital Signs: Temp: 97.8 F (36.6 C) (11/30 0556) Temp Source: Oral (11/30 0556) BP: 124/55 (11/30 0556) Pulse Rate: 86 (11/30 0556)  Labs: Recent Labs    01/17/20 0558 01/17/20 0558 01/18/20 0419 01/19/20 0514  HGB 7.0*   < > 7.4* 6.4*  HCT 21.7*  --  23.2* 20.0*  PLT 233  --  251 258  LABPROT 28.5*  --  27.3* 23.2*  INR 2.8*  --  2.6* 2.1*  CREATININE 1.47*  --  1.41* 1.42*   < > = values in this interval not displayed.    Estimated Creatinine Clearance: 34.1 mL/min (A) (by C-G formula based on SCr of 1.42 mg/dL (H)).   Medications:  - PTA warfarin regimen: 2mg  daily  Assessment: Patient is an 86 y.o M with hx afib and AVR on warfarin PTA, presented to the ED on 11/19 s/p fall with right hip fracture.  He underwent right femur intramedullary fixation on 11/21. Warfarin and lovenox 0.5mg /kg q12h started post-op on 11/21.  - 11/20: vit K 10mg  PO x1 - 11/22: 2 units PRBC - 11/24: lovenox d/ced  Today, 01/19/2020: - INR remains therapeutic at 2.1 - Hg down to 6.4> 1 unit PRBC ordered, PLT WNL - drug-drug intxns: being on abx (zosyn) can make patient more sensitive to warfarin - in afib - scr 1.42  Goal of Therapy:  INR 2-3 Monitor platelets by anticoagulation protocol: Yes   Plan:  - repeat warfarin 1 mg PO x1 today - daily INR - monitor for s/sx bleeding  Eudelia Bunch, Pharm.D 01/19/2020 9:52 AM    Addendum: Coumadin dc'd by MD No coumadin today Will keep daily INR incase resumed  Eudelia Bunch, Pharm.D 01/19/2020 10:17 AM

## 2020-01-19 NOTE — Progress Notes (Signed)
.  CRITICAL VALUE ALERT  Critical Value: Hgb 6.4  Date & Time Notied:  01/19/2020 @ 0615  Provider Notified: [phone]  Orders Received/Actions taken: Transfuse RBC

## 2020-01-20 ENCOUNTER — Telehealth: Payer: Self-pay | Admitting: Internal Medicine

## 2020-01-20 LAB — PROTIME-INR
INR: 2.4 — ABNORMAL HIGH (ref 0.8–1.2)
Prothrombin Time: 25.5 seconds — ABNORMAL HIGH (ref 11.4–15.2)

## 2020-01-20 LAB — PHOSPHORUS
Phosphorus: 3 mg/dL (ref 2.5–4.6)
Phosphorus: 2.6 mg/dL (ref 2.5–4.6)

## 2020-01-20 LAB — CBC WITH DIFFERENTIAL/PLATELET
Abs Immature Granulocytes: 0.13 10*3/uL — ABNORMAL HIGH (ref 0.00–0.07)
Basophils Absolute: 0.1 10*3/uL (ref 0.0–0.1)
Basophils Relative: 0 %
Eosinophils Absolute: 0.2 10*3/uL (ref 0.0–0.5)
Eosinophils Relative: 1 %
HCT: 23.7 % — ABNORMAL LOW (ref 39.0–52.0)
Hemoglobin: 7.7 g/dL — ABNORMAL LOW (ref 13.0–17.0)
Immature Granulocytes: 1 %
Lymphocytes Relative: 8 %
Lymphs Abs: 1.4 10*3/uL (ref 0.7–4.0)
MCH: 29.5 pg (ref 26.0–34.0)
MCHC: 32.5 g/dL (ref 30.0–36.0)
MCV: 90.8 fL (ref 80.0–100.0)
Monocytes Absolute: 2 10*3/uL — ABNORMAL HIGH (ref 0.1–1.0)
Monocytes Relative: 11 %
Neutro Abs: 14.2 10*3/uL — ABNORMAL HIGH (ref 1.7–7.7)
Neutrophils Relative %: 79 %
Platelets: 288 10*3/uL (ref 150–400)
RBC: 2.61 MIL/uL — ABNORMAL LOW (ref 4.22–5.81)
RDW: 14.5 % (ref 11.5–15.5)
WBC: 17.9 10*3/uL — ABNORMAL HIGH (ref 4.0–10.5)
nRBC: 0 % (ref 0.0–0.2)

## 2020-01-20 LAB — COMPREHENSIVE METABOLIC PANEL
ALT: 25 U/L (ref 0–44)
AST: 52 U/L — ABNORMAL HIGH (ref 15–41)
Albumin: 2.4 g/dL — ABNORMAL LOW (ref 3.5–5.0)
Alkaline Phosphatase: 168 U/L — ABNORMAL HIGH (ref 38–126)
Anion gap: 10 (ref 5–15)
BUN: 34 mg/dL — ABNORMAL HIGH (ref 8–23)
CO2: 20 mmol/L — ABNORMAL LOW (ref 22–32)
Calcium: 8 mg/dL — ABNORMAL LOW (ref 8.9–10.3)
Chloride: 111 mmol/L (ref 98–111)
Creatinine, Ser: 1.37 mg/dL — ABNORMAL HIGH (ref 0.61–1.24)
GFR, Estimated: 50 mL/min — ABNORMAL LOW (ref >60)
Glucose, Bld: 135 mg/dL — ABNORMAL HIGH (ref 70–99)
Potassium: 3.5 mmol/L (ref 3.5–5.1)
Sodium: 141 mmol/L (ref 135–145)
Total Bilirubin: 2.6 mg/dL — ABNORMAL HIGH (ref 0.3–1.2)
Total Protein: 6.5 g/dL (ref 6.5–8.1)

## 2020-01-20 LAB — TYPE AND SCREEN
ABO/RH(D): O POS
Antibody Screen: NEGATIVE
Unit division: 0
Unit division: 0

## 2020-01-20 LAB — BPAM RBC
Blood Product Expiration Date: 202112292359
Blood Product Expiration Date: 202201012359
ISSUE DATE / TIME: 202111281612
ISSUE DATE / TIME: 202111301227
Unit Type and Rh: 5100
Unit Type and Rh: 5100

## 2020-01-20 LAB — GLUCOSE, CAPILLARY
Glucose-Capillary: 129 mg/dL — ABNORMAL HIGH (ref 70–99)
Glucose-Capillary: 155 mg/dL — ABNORMAL HIGH (ref 70–99)
Glucose-Capillary: 128 mg/dL — ABNORMAL HIGH (ref 70–99)
Glucose-Capillary: 190 mg/dL — ABNORMAL HIGH (ref 70–99)

## 2020-01-20 LAB — MAGNESIUM
Magnesium: 2.3 mg/dL (ref 1.7–2.4)
Magnesium: 2.3 mg/dL (ref 1.7–2.4)

## 2020-01-20 NOTE — Telephone Encounter (Signed)
    [name] calling, she said pt fell and broke his hip and been in [facility] hospital since 11/19. Currently the doctors is concerned about pt's heart. She is wondering if Dr. Harrington Challenger can stopped by and see the pt in [facility]

## 2020-01-20 NOTE — TOC Initial Note (Signed)
Transition of Care [facility]) - Initial/Assessment Note    Patient Details  Name: Robert Richard MRN: [id number] Date of Birth: 04-09-1930  Transition of Care [facility]) CM/SW Contact:    Ross Ludwig, LCSW Phone Number: 01/20/2020, 4:52 PM  Clinical Narrative:                  Patient is an 86 year old male who is married and lives with his wife.  Patient is alert and oriented x1.  Per patient's wife, he has not been to SNF for short term rehab.  CSW explained the process and how insurance pays for stay at [facility].  CSW discussed how there are several facilities locally.  Patient's wife stated she would prefer [facility], but asked CSW to send to [facility] as well.  CSW to begin bed search and also insurance authorization.  Expected Discharge Plan: Skilled Nursing Facility Barriers to Discharge: Insurance Authorization, Continued Medical Work up   Patient Goals and CMS Choice Patient states their goals for this hospitalization and ongoing recovery are:: Patient plans to go to SNF for short term rehab, then return back home. CMS Medicare.gov Compare Post Acute Care list provided to:: Patient Represenative (must comment) Choice offered to / list presented to : Spouse  Expected Discharge Plan and Services Expected Discharge Plan: [place] Choice: [place] arrangements for the past 2 months: Single Family Home                                      Prior Living Arrangements/Services Living arrangements for the past 2 months: Single Family Home Lives with:: Spouse Patient language and need for interpreter reviewed:: Yes Do you feel safe going back to the place where you live?: No   Patient's wife feels he needs short term rehab, before returnting back home.  Need for Family Participation in Patient Care: Yes (Comment) Care giver support system in place?: No (comment)   Criminal Activity/Legal Involvement  Pertinent to Current Situation/Hospitalization: No - Comment as needed  Activities of Daily Living Home Assistive Devices/Equipment: Cane (specify quad or straight) ADL Screening (condition at time of admission) Patient's cognitive ability adequate to safely complete daily activities?: Yes Is the patient deaf or have difficulty hearing?: No Does the patient have difficulty seeing, even when wearing glasses/contacts?: No Does the patient have difficulty concentrating, remembering, or making decisions?: No Patient able to express need for assistance with ADLs?: Yes Does the patient have difficulty dressing or bathing?: No Independently performs ADLs?: Yes (appropriate for developmental age) Does the patient have difficulty walking or climbing stairs?: Yes Weakness of Legs: Both Weakness of Arms/Hands: None  Permission Sought/Granted Permission sought to share information with : Facility Sport and exercise psychologist, Family Supports Permission granted to share information with : Yes, Verbal Permission Granted  Share Information with NAME: Hayter,Carol Spouse [phone]  [phone]  Permission granted to share info w AGENCY: SNF admissions        Emotional Assessment Appearance:: Appears stated age   Affect (typically observed): Calm, Accepting, Appropriate Orientation: : Oriented to Self Alcohol / Substance Use: Not Applicable Psych Involvement: No (comment)  Admission diagnosis:  Contusion of scalp, initial encounter [S00.03XA] Contusion of face, initial encounter [S00.83XA] Closed nondisplaced intertrochanteric fracture of right femur, initial encounter ([place]) [S72.144A] Closed right hip fracture, initial encounter ([place]) [S72.001A] Fall in home, initial  encounter [id number], Y92.009] Closed comminuted intertrochanteric fracture of proximal end of right femur [facility]) [S72.141A] Patient Active Problem List   Diagnosis Date Noted  . Closed right hip fracture, initial encounter ([place])  12/21/2019  . Facial laceration 01/11/2020  . Leukocytosis 12/28/2019  . Hyperglycemia 01/16/2020  . Fall at home, initial encounter 01/03/2020  . Parkinson's disease ([place]) 08/02/2019  . Cognitive changes 08/02/2019  . Left sided abdominal pain 01/14/2019  . GERD (gastroesophageal reflux disease) 11/29/2017  . Confusion 10/15/2017  . Anxiety 11/05/2016  . Cough 09/12/2016  . Diabetes mellitus without complication ([place]) 39/76/7341  . Pain in both lower extremities 05/01/2016  . Bilateral hearing loss 05/01/2016  . Migraine 04/26/2015  . Aortic valve replaced 04/21/2015  . Melanoma of neck ([place]) 07/05/2014  . Encounter for therapeutic drug monitoring 03/20/2013  . CAD (coronary artery disease) 03/13/2013  . Atrial fibrillation with RVR ([place]) 03/13/2013  . Protein-calorie malnutrition, severe ([place]) 02/27/2013  . S/P CABG x 3 02/20/2013  . History of embolectomy 09/22/2012  . CKD (chronic kidney disease) 09/22/2012  . Syncope 09/15/2012  . Peripheral vascular disease, unspecified ([place]) 07/08/2012  . Mitral stenosis 07/27/2010  . Hyperlipidemia 10/25/2009  . NEPHROLITHIASIS, HX OF 10/25/2009  . SNORING 10/14/2008  . APNEA 10/05/2008  . RESTLESS LEG SYNDROME 09/11/2007  . Allergic rhinitis, cause unspecified 09/11/2007  . GOUT 03/15/2006  . ASTHMA 03/15/2006  . COPD 03/15/2006  . Osteoporosis 03/15/2006  . COLONIC POLYPS, HX OF 03/15/2006   PCP:  [name], MD Pharmacy:   [facility], [place] [address] Phone: [phone] Fax: [place], [address] [place] [address] Phone: [phone] Fax: [phone]     Social Determinants of Health (SDOH) Interventions    Readmission Risk Interventions No flowsheet data found.

## 2020-01-20 NOTE — Telephone Encounter (Signed)
Prescribed warfarin for afib and AVR.  Warfarin on hold currently. Received blood transfusion for hgb 6.4.   Will route to Dr. Harrington Challenger to make aware.

## 2020-01-20 NOTE — Progress Notes (Signed)
PROGRESS NOTE    Robert Richard  VZD:[id number] DOB: [birth year] DOA: 12/30/2019 PCP: [name], MD   Chief Complaint  Patient presents with  . Fall  . Leg Injury   Brief Narrative:  Patient is 86yo patient with CAD, hyperlipidemia, A. fib on warfarin, GERD, CKD stage III, Parkinson's disease presented to ED after sustaining a fall at home.  History per his wife, patient had gotten out of the shower and while he was in the bathroom, he fell and landed on the right side of his face, right hip.  Patient sustained facial laceration with scalp contusion.  He also complained of right hip pain with difficulty in being able to bear weight on the right leg due to pain.  EMS was called. In ED BP was soft-2/52, and leukocytosis with normocytic anemia.  Creatinine 1.4.  CT imaging showed no acute intracranial abnormality but showed right frontal/supraorbital swelling and laceration extending to the temporal soft tissues, mild right liver contusion and laceration.  No facial bone fracture. Hip x-ray showed right hip fracture He's now s/p surgery with ortho.  Post op course has been complicated by aspiration pneumonia, pseudomonas UTI, anemia requiring transfusion, and delirium.  Palliative care following. Patient is currently alert and oriented x2 and not in any distress.  Assessment & Plan:   Principal Problem:   Closed right hip fracture, initial encounter ([place]) Active Problems:   Hyperlipidemia   CKD (chronic kidney disease)   CAD (coronary artery disease)   Atrial fibrillation with RVR (HCC)   Diabetes mellitus without complication (HCC)   GERD (gastroesophageal reflux disease)   Parkinson's disease ([place])   Facial laceration   Leukocytosis   Hyperglycemia   Fall at home, initial encounter  Goals of care:  Pt is DNR after conversation with palliative care.  Will continue to monitor course.  Persistent pneumonia, aspiration.  Encephalopathy is slightly worse today.  Discussed with wife  continued encephalopathy, aspiration and poor prognosis - I brought up possibility of hospice if he doesn't improve.  Will continue to monitor on abx and with pRBC transfusion today.  Continue goals of care discussions.  Palliative care following, appreciate recs  Clinical problems list  Aspiration Pneumonia  Shortness of Breath  Volume Overloaded CXR 11/23 with worsening airspace disease in LLL, concerning for pneumonia vs aspiration 11/25 CXR with persistent L mid and L lower lung airspace disease, new airspace densities within the RU and RL lung CXR 11/26 with multifocal pneumonia, worsened in the R lung Continues on RA, tachypneic.   11/30 CXR with persistent bilateral upper lung zone predominant air space opacities, stable R effusion Continue antibiotics -> zosyn (11/25 - present).  Plan for 7 days of abx. Continue daily lasix for now, elevated BNP Blood cx NG SLP eval with mild/moderate aspiration risk -> dysphagia 1 diet,  nectar thick liquid. Oropharyngeal exam showed left tonsillar exudate possibly contributing to the ongoing infectious process. Patient is currently receiving Zosyn antibiotics.   Acute on chronic blood loss anemia, postop -Hemoglobin 7.2 on 11/22, status post 2 units packed RBC transfusion - transfused 1 unit pRBC 11/28 - transfused 1 unit pRBC on 11/30 - Relatively stable, labs with iron def anemia -Continue with supplemental iron Monitor CBC and transfuse if hemoglobin is less than 7.0  Pseudomonas UTI Urine cx with pansensitive pseudomonas Continue zosyn  Acute Metabolic Encephalopathy: suspect 2/2 possible aspiration pneumonia versus urinary tract infection versus hospital-acquired delirium  Repeat head CT 11/24 without acute intracranial abnormality Delirium  precautions  Treat aspiration pneumonia and pseudomonas UTI Mental status appears to be improving as patient is alert and oriented x3.  Acute comminuted closed right hip fracture, after  mechanical fall at home [facility]) -Right hip x-ray showed acute comminuted intertrochanteric fracture of the right hip with superior subluxation -Underwent intramedullary fixation of the right femur (Dr [name]), on 44/96 -Complicated by anemia and A. fib with RVR on 11/22, doing well today, will start PT - Holding warfarin now - follow for resumption  - therapy recommending SNF  Chronic atrial fibrillation with RVR -Postop in atrial fibrillation with RVR on 11/22 requiring digoxin, IV Cardizem. Resumed oral Cardizem 240 mg daily -> transitioned to Q6 due to need to crush meds -coumadin on hold with need for recurrent transfusion - follow for resumption   Right frontal/supraorbital swelling and laceration -Continue wound care, no active bleeding  Leukocytosis  Rising   Diabetes mellitus type 2, diet controlled -Hemoglobin A1c 6.5 08/09/2019 -Continue carb modified diet Fingersticks twice daily  CKD stageIIIa  NAGMA -Baseline creatinine ~1.5 -Avoid nephrotoxic medications, hypotension - NAGMA, will continue to monitor, likely 2/2 CKD and IVF  Topamax on hold for now - start PO bicarb   Elevated LFTs:  Continue to monitor  CAD, hyperlipidemia Continue Crestor  GERD -Continue Protonix   Parkinson's disease Continue Sinemet  Hyperglycemia: ssi, follow A1c (5.5)  Elevated Bili  LFTs: mild, continue to monitor  Underweight, moderate protein calorie malnutrition Estimated body mass index is 18.62 kg/m. Nutritional supplements  DVT prophylaxis: Warfarin Code Status: DNR Family Communication:  Wife at bedside Disposition:   Status is: Inpatient  Remains inpatient appropriate because:Inpatient level of care appropriate due to severity of illness   Dispo: The patient is from: Home              Anticipated d/c is to: SNF              Anticipated d/c date is: > 3 days              Patient currently is not medically stable to d/c.       Consultants:    Palliative care  Cardiology  ortho  Procedures:  intramedullary fixation of the right femur (Dr [name]), on 11/21  Antimicrobials:  Anti-infectives (From admission, onward)   Start     Dose/Rate Route Frequency Ordered Stop   01/14/20 0830  piperacillin-tazobactam (ZOSYN) IVPB 3.375 g        3.375 g 12.5 mL/hr over 240 Minutes Intravenous Every 8 hours 01/14/20 0806 01/20/20 2359   01/12/20 2100  vancomycin (VANCOCIN) IVPB 1000 mg/200 mL premix        1,000 mg 200 mL/hr over 60 Minutes Intravenous  Once 01/12/20 2001 01/13/20 0548   01/12/20 2030  cefTRIAXone (ROCEPHIN) 1 g in sodium chloride 0.9 % 100 mL IVPB  Status:  Discontinued        1 g 200 mL/hr over 30 Minutes Intravenous Every 24 hours 01/12/20 1930 01/14/20 0756   01/14/2020 1500  ceFAZolin (ANCEF) IVPB 2g/100 mL premix        2 g 200 mL/hr over 30 Minutes Intravenous Every 6 hours 01/16/2020 1113 12/22/2019 2141     Subjective: Denies complaints Confused today, worse than yesterday  Objective: Vitals:   01/20/20 0700 01/20/20 0958 01/20/20 1025 01/20/20 1300  BP:  131/75 (!) 116/57 (!) 134/48  Pulse:  (!) 103 87 93  Resp:  (!) 24 18 20   Temp:  98.5 F (36.9  C) 98.3 F (36.8 C) 98.4 F (36.9 C)  TempSrc:  Oral Oral Oral  SpO2:  93% 94% 95%  Weight: 66.6 kg     Height:        Intake/Output Summary (Last 24 hours) at 01/20/2020 1546 Last data filed at 01/20/2020 1300 Gross per 24 hour  Intake 727.17 ml  Output 1650 ml  Net -922.83 ml   Filed Weights   12/26/2019 2312 01/19/20 0500 01/20/20 0700  Weight: 65.8 kg 65.8 kg 66.6 kg    Examination:  General: No acute distress he is alert and oriented x3. HEENT: Left tonsillar erythema plus exudate Cardiovascular: Heart sounds show a regular rate, and rhythm with systolic ejection murmur Lungs: coarse breath sounds diminished breath sounds right mid and lower zones Abdomen: Soft, nontender, nondistended Neurological: Alert and oriented 3. Moves all  extremities 4. Cranial nerves II through XII grossly intact. Skin: Warm and dry.  Right facial abrasion covered with Band-Aid. Extremities: RLE with intact dressing - edematous, no notable echymoses, soft      Data Reviewed: I have personally reviewed following labs and imaging studies  CBC: Recent Labs  Lab 01/16/20 0453 01/16/20 0453 01/17/20 0558 01/18/20 0419 01/19/20 0514 01/19/20 1808 01/20/20 0456  WBC 13.8*  --  14.6* 15.4* 16.3*  --  17.9*  NEUTROABS 10.8*  --  11.2* 12.0* 12.4*  --  14.2*  HGB 7.4*   < > 7.0* 7.4* 6.4* 8.3* 7.7*  HCT 23.2*   < > 21.7* 23.2* 20.0* 25.1* 23.7*  MCV 92.8  --  93.5 92.1 91.7  --  90.8  PLT 197  --  233 251 258  --  288   < > = values in this interval not displayed.    Basic Metabolic Panel: Recent Labs  Lab 01/16/20 0453 01/17/20 0558 01/18/20 0419 01/19/20 0514 01/20/20 0456  NA 138 138 140 140 141  K 3.9 4.0 3.8 3.7 3.5  CL 111 111 111 111 111  CO2 17* 17* 18* 19* 20*  GLUCOSE 127* 136* 151* 148* 135*  BUN 27* 29* 29* 34* 34*  CREATININE 1.27* 1.47* 1.41* 1.42* 1.37*  CALCIUM 7.8* 7.9* 7.8* 7.8* 8.0*  MG 2.2 2.2 2.2 2.3 2.3  PHOS 2.7 2.7 2.7 2.7 3.0    GFR: Estimated Creatinine Clearance: 35.8 mL/min (A) (by C-G formula based on SCr of 1.37 mg/dL (H)).  Liver Function Tests: Recent Labs  Lab 01/16/20 0453 01/17/20 0558 01/18/20 0419 01/19/20 0514 01/20/20 0456  AST 44* 59* 52* 52* 52*  ALT 12 16 21 27 25   ALKPHOS 113 139* 140* 146* 168*  BILITOT 1.4* 1.5* 1.7* 1.6* 2.6*  PROT 6.3* 6.4* 6.2* 6.1* 6.5  ALBUMIN 2.5* 2.4* 2.4* 2.4* 2.4*    CBG: Recent Labs  Lab 01/19/20 1204 01/19/20 1627 01/19/20 2118 01/20/20 0747 01/20/20 1137  GLUCAP 124* 154* 122* 129* 155*     Recent Results (from the past 240 hour(s))  Culture, blood (routine x 2)     Status: None   Collection Time: 01/12/20  7:21 PM   Specimen: BLOOD  Result Value Ref Range Status   Specimen Description   Final    BLOOD RIGHT  ANTECUBITAL Performed at [facility], [place] [address], [place], [place] [address]    Special Requests   Final    BOTTLES DRAWN AEROBIC AND ANAEROBIC Blood Culture adequate volume Performed at [facility] [address], [place], [place] [address]    Culture  Final    NO GROWTH 5 DAYS Performed at [facility], [place] [address], [place], [place] [address]    Report Status 01/17/2020 FINAL  Final  Culture, blood (routine x 2)     Status: None   Collection Time: 01/12/20  7:22 PM   Specimen: BLOOD  Result Value Ref Range Status   Specimen Description   Final    BLOOD LEFT ARM Performed at [facility] [address] [place], [place] [address]    Special Requests   Final    BOTTLES DRAWN AEROBIC ONLY Blood Culture adequate volume Performed at [facility] [address], [place], [place] [address]    Culture   Final    NO GROWTH 5 DAYS Performed at [facility], [place] [address] [place], [place] [address]    Report Status 01/17/2020 FINAL  Final  Culture, Urine     Status: Abnormal   Collection Time: 01/13/20  1:20 AM   Specimen: Urine, Random  Result Value Ref Range Status   Specimen Description   Final    URINE, RANDOM Performed at [place] [address] [place], [place] [address]    Special Requests   Final    NONE Performed at [facility], [place] [address] [place], [place] [address]    Culture 50,000 COLONIES/mL PSEUDOMONAS AERUGINOSA (A)  Final   Report Status 01/15/2020 FINAL  Final   Organism ID, Bacteria PSEUDOMONAS AERUGINOSA (A)  Final      Susceptibility   Pseudomonas aeruginosa - MIC*    CEFTAZIDIME 2 SENSITIVE Sensitive     CIPROFLOXACIN 0.5 SENSITIVE Sensitive     GENTAMICIN <=1 SENSITIVE Sensitive     IMIPENEM 2 SENSITIVE Sensitive     PIP/TAZO <=4 SENSITIVE Sensitive     CEFEPIME 2 SENSITIVE Sensitive     * 50,000  COLONIES/mL PSEUDOMONAS AERUGINOSA         Radiology Studies: DG CHEST PORT 1 VIEW  Result Date: 01/19/2020 CLINICAL DATA:  Cough.  Pneumonia. EXAM: PORTABLE CHEST 1 VIEW COMPARISON:  01/18/2020 FINDINGS: Stable cardiac enlargement. Aortic atherosclerosis. Previous median sternotomy, CABG procedure, and aortic valve repair. Unchanged right pleural effusion with veil like opacification of the right lower lobe. The left pleural effusion appears decreased from previous exam. No significant change in bilateral upper lung zone predominant airspace opacities. IMPRESSION: 1. Persistent bilateral upper lung zone predominant airspace opacities. 2. Stable right pleural effusion. 3. Decrease in left pleural effusion. Electronically Signed   By: Kerby Moors M.D.   On: 01/19/2020 10:42        Scheduled Meds: . sodium chloride   Intravenous Once  . carbidopa-levodopa  1 tablet Oral TID  . diltiazem  60 mg Oral Q6H  . docusate  100 mg Oral BID  . feeding supplement  237 mL Oral BID BM  . ferrous sulfate  325 mg Oral Q breakfast  . furosemide  20 mg Intravenous Daily  . insulin aspart  0-9 Units Subcutaneous TID WC  . ketotifen  1 drop Both Eyes Daily  . lidocaine   Inhalation QHS  . multivitamin with minerals   Oral Daily  . nystatin  5 mL Oral QID  . pantoprazole sodium  40 mg Oral BID  . rosuvastatin  2.5 mg Oral QODAY  . sodium bicarbonate  650 mg Oral BID   Continuous Infusions: . piperacillin-tazobactam (ZOSYN)  IV 3.375 g (01/20/20 1535)  LOS: 11 days    Time spent: over 36 min    Elie Confer, MD [facility] [phone]   To contact the attending provider between 7A-7P or the covering provider during after hours 7P-7A, please log into the web site [email or web address] and access using universal [place] password for that web site. If you do not have the password, please call the hospital operator.  01/20/2020, 3:46 PM

## 2020-01-20 NOTE — Progress Notes (Signed)
Speech Language Pathology Treatment: Dysphagia  Patient Details Name: Robert Richard MRN: [id number] DOB: [birth year] Today's Date: 01/20/2020 Time: 9892-1194 SLP Time Calculation (min) (ACUTE ONLY): 12 min  Assessment / Plan / Recommendation Clinical Impression  Second session with pt and wife regarding diet, compensation strategies, use of thickening for improved comfort with drinks pt enjoys to consume. Pt consumed Lemon=lime soda that was thickened to nectar consistency- repeated stating "that's good" after swallow.  NO indications of aspiration noted with nectar via straw despite pt taking multiple sips but pt was fully upright.  Pt also without coughing with tsps of thin lemon lime soda with pt positioned upright.  Advised strict adherence to swallowing precautions, stopping intake if pt excessively coughing and encouraging expectoration. Wife reports pt coughed and expectorated a multitude of secretions yesterday - and he was observed to expectorate x1 during SLP session.  Encouraged pt's wife to order pt's meals (and to bring soft snack from home for pt - not full meals).  Wife expressed gratitude for SLP input.     Pt was sleeping at end of session.  Of note, Pt's anterior and buccal regions of oral cavity appeared clear.  There was a concern of possible oral candidiasis, especially as pt has premorbid oral candidiasis after his heart surgery that was severe per his wife.  Posterior oral cavity near faucial pillars/palatine tonsils on left appeared to have upper white - lower dark brown round area that did not clear with cued coughing, oral suction, intake of thin via tsp, applesauce and nectar liquid.  SLP had pt's wife examine pt's posterior oral cavity to allow her to monitor for clearance  * ? secretions, ? infection?  source? [name] viewed and advised she would monitor this area =provided her with tongue depressors for this use and demonstrated oral suction to wife.  Informed MD and RN to  finding - pt now being treated for tonsillitis.  SLP will follow up for dysphagia management.    Comprehension of dysphagia and compensation strategies is progressing with pt's caregiver and pt.    HPI HPI: Pt is an 86 y.o. male with medical history significant for hyperlipidemia, CAD, A. fib on warfarin, GERD, CKD stage III, Parkinson's disease who presented to the ED via EMS after sustaining a fall at home. CXR 11/23: Worsening airspace disease in the left lower lung, suspect for pneumonia or potentially aspiration. Cardiomegaly. Pt was most recently seen by SLP on 03/17/13 and a dysphagia 2 diet with thin liquids was recommended at that time per pt's request d/t thrush. MBS 02/26/13: Pt. exhibited mild oral dysphagia with delayed transit exacerbated by xerostomia and decreased endurance.  Mild-mod sensory pharyngeal dysphagia with reduced tongue base retraction and laryngeal elevation resulting in trace vallecular/pyriform sinus/pharyngeal wall residue.    Concerns for aspiration pna present.      SLP Plan  Continue with current plan of care       Recommendations  Diet recommendations: Dysphagia 1 (puree);Nectar-thick liquid (tsps of thin ok anytime) Liquids provided via: Cup;Straw;Teaspoon Medication Administration: Crushed with puree Supervision: Full supervision/cueing for compensatory strategies Compensations: Slow rate;Small sips/bites;Other (Comment) (fully upright and alert, assure swallows before giving more) Postural Changes and/or Swallow Maneuvers: Seated upright 90 degrees;Upright 30-60 min after meal                Oral Care Recommendations: Other (Comment) (oral care TID, wife to examine oral cavity daily) Follow up Recommendations: Skilled Nursing facility SLP Visit Diagnosis: Dysphagia, oropharyngeal phase (  R13.12);Dysphagia, pharyngoesophageal phase (R13.14) Plan: Continue with current plan of care       GO                Macario Golds 01/20/2020, 3:57  PM  Kathleen Lime, MS [facility] SLP Acute Rehab Services Office [phone] Pager [phone]

## 2020-01-20 NOTE — Progress Notes (Signed)
Patient has incomprehensible speech and starts labored breathing and groaning. Patient denies having any pain or being uncomfortable. SPO2 97% on room air. Robert Richard

## 2020-01-20 NOTE — NC FL2 (Signed)
[place] MEDICAID FL2 LEVEL OF CARE SCREENING TOOL     IDENTIFICATION  Patient Name: Robert Richard Birthdate: 03/11/1930 Sex: male Admission Date (Current Location): 12/21/2019  [facility] and [place] Number:  Herbalist and Address:  [facility],  [place], [place]      Provider Number: [id number]  Attending Physician Name and Address:  Elie Confer, MD  Relative Name and Phone Number:  H[place], Laski [phone]  [phone]    Current Level of Care: Hospital Recommended Level of Care: [place] Prior Approval Number:    Date Approved/Denied:   PASRR Number: [id number]  Discharge Plan: SNF    Current Diagnoses: Patient Active Problem List   Diagnosis Date Noted  . Closed right hip fracture, initial encounter ([place]) 01/08/2020  . Facial laceration 01/19/2020  . Leukocytosis 12/21/2019  . Hyperglycemia 01/08/2020  . Fall at home, initial encounter 12/24/2019  . Parkinson's disease ([place]) 08/02/2019  . Cognitive changes 08/02/2019  . Left sided abdominal pain 01/14/2019  . GERD (gastroesophageal reflux disease) 11/29/2017  . Confusion 10/15/2017  . Anxiety 11/05/2016  . Cough 09/12/2016  . Diabetes mellitus without complication ([place]) 77/82/4235  . Pain in both lower extremities 05/01/2016  . Bilateral hearing loss 05/01/2016  . Migraine 04/26/2015  . Aortic valve replaced 04/21/2015  . Melanoma of neck ([place]) 07/05/2014  . Encounter for therapeutic drug monitoring 03/20/2013  . CAD (coronary artery disease) 03/13/2013  . Atrial fibrillation with RVR ([place]) 03/13/2013  . Protein-calorie malnutrition, severe ([place]) 02/27/2013  . S/P CABG x 3 02/20/2013  . History of embolectomy 09/22/2012  . CKD (chronic kidney disease) 09/22/2012  . Syncope 09/15/2012  . Peripheral vascular disease, unspecified ([place]) 07/08/2012  . Mitral stenosis 07/27/2010  . Hyperlipidemia 10/25/2009  . NEPHROLITHIASIS, HX OF  10/25/2009  . SNORING 10/14/2008  . APNEA 10/05/2008  . RESTLESS LEG SYNDROME 09/11/2007  . Allergic rhinitis, cause unspecified 09/11/2007  . GOUT 03/15/2006  . ASTHMA 03/15/2006  . COPD 03/15/2006  . Osteoporosis 03/15/2006  . COLONIC POLYPS, HX OF 03/15/2006    Orientation RESPIRATION BLADDER Height & Weight     Self  Normal Incontinent Weight: 146 lb 13.2 oz (66.6 kg) Height:  6\' 2"  (188 cm)  BEHAVIORAL SYMPTOMS/MOOD NEUROLOGICAL BOWEL NUTRITION STATUS      Incontinent Diet (Dysphagia 1 diet.)  AMBULATORY STATUS COMMUNICATION OF NEEDS Skin   Limited Assist Verbally Surgical wounds                       Personal Care Assistance Level of Assistance  Bathing, Feeding, Dressing Bathing Assistance: Limited assistance   Dressing Assistance: Limited assistance     Functional Limitations Info  Sight, Speech, Hearing Sight Info: Adequate Hearing Info: Adequate Speech Info: Adequate    SPECIAL CARE FACTORS FREQUENCY  PT (By licensed PT), OT (By licensed OT)     PT Frequency: Minimum 5x a week OT Frequency: Minimum 5x a week            Contractures Contractures Info: Not present    Additional Factors Info  Code Status, Allergies, Insulin Sliding Scale Code Status Info: DNR Allergies Info: Iodinated Diagnostic Agents Ioxaglate   Insulin Sliding Scale Info: insulin aspart (novoLOG) injection 0-9 Units 3x a day with meals.       Current Medications (01/20/2020):  This is the current hospital active medication list Current Facility-Administered Medications  Medication Dose Route Frequency Provider Last Rate Last Admin  .  0.9 %  sodium chloride infusion (Manually program via Guardrails IV Fluids)   Intravenous Once Sharion Settler, NP 10 mL/hr at 01/19/20 0743 Rate Change at 01/19/20 0743  . acetaminophen (TYLENOL) tablet 650 mg  650 mg Oral Q6H PRN Rai, Ripudeep K, MD   650 mg at 01/20/20 0550  . carbidopa-levodopa (SINEMET IR) 25-100 MG per tablet immediate  release 1 tablet  1 tablet Oral TID Rai, Vernelle Emerald, MD   1 tablet at 01/20/20 1302  . diltiazem (CARDIZEM) tablet 60 mg  60 mg Oral Q6H Elodia Florence., MD   60 mg at 01/20/20 1302  . docusate (COLACE) 50 MG/5ML liquid 100 mg  100 mg Oral BID Elodia Florence., MD   100 mg at 01/20/20 1057  . feeding supplement (ENSURE ENLIVE / ENSURE PLUS) liquid 237 mL  237 mL Oral BID BM Rai, Ripudeep K, MD   237 mL at 01/20/20 1531  . ferrous sulfate tablet 325 mg  325 mg Oral Q breakfast Elodia Florence., MD   325 mg at 01/20/20 0847  . food thickener (THICK IT) powder   Oral PRN Elodia Florence., MD      . furosemide (LASIX) injection 20 mg  20 mg Intravenous Daily Elodia Florence., MD   20 mg at 01/20/20 0848  . insulin aspart (novoLOG) injection 0-9 Units  0-9 Units Subcutaneous TID WC Elodia Florence., MD   2 Units at 01/20/20 1302  . ketotifen (ZADITOR) 0.025 % ophthalmic solution 1 drop  1 drop Both Eyes Daily Rai, Ripudeep K, MD   1 drop at 01/20/20 0846  . lidocaine (XYLOCAINE) 4 % (PF) injection   Inhalation QHS Minda Ditto, [facility]   Given at 01/19/20 2326  . menthol-cetylpyridinium (CEPACOL) lozenge 3 mg  1 lozenge Oral PRN Rai, Ripudeep K, MD       Or  . phenol (CHLORASEPTIC) mouth spray 1 spray  1 spray Mouth/Throat PRN Rai, Ripudeep K, MD      . metoCLOPramide (REGLAN) tablet 5-10 mg  5-10 mg Oral Q8H PRN Rai, Ripudeep K, MD       Or  . metoCLOPramide (REGLAN) injection 5-10 mg  5-10 mg Intravenous Q8H PRN Rai, Ripudeep K, MD   10 mg at 01/16/20 1447  . morphine 2 MG/ML injection 2 mg  2 mg Intravenous Q4H PRN Rai, Ripudeep K, MD   2 mg at 01/17/2020 1635  . multivitamin with minerals tablet   Oral Daily Rai, Ripudeep K, MD   1 tablet at 01/20/20 0847  . nystatin (MYCOSTATIN) 100000 UNIT/ML suspension 500,000 Units  5 mL Oral QID Elodia Florence., MD   500,000 Units at 01/20/20 1302  . ondansetron (ZOFRAN) tablet 4 mg  4 mg Oral Q6H PRN Rai, Ripudeep K, MD        Or  . ondansetron (ZOFRAN) injection 4 mg  4 mg Intravenous Q6H PRN Rai, Ripudeep K, MD   4 mg at 01/16/20 1151  . pantoprazole sodium (PROTONIX) 40 mg/20 mL oral suspension 40 mg  40 mg Oral BID Elodia Florence., MD   40 mg at 01/20/20 1058  . piperacillin-tazobactam (ZOSYN) IVPB 3.375 g  3.375 g Intravenous Q8H Elodia Florence., MD 12.5 mL/hr at 01/20/20 1535 3.375 g at 01/20/20 1535  . Resource ThickenUp Clear   Oral PRN Elodia Florence., MD      . rosuvastatin (CRESTOR) tablet 2.5 mg  2.5 mg Oral QODAY Rai, Ripudeep K, MD   2.5 mg at 01/20/20 0847  . sodium bicarbonate tablet 650 mg  650 mg Oral BID Elodia Florence., MD   650 mg at 01/20/20 1658     Discharge Medications: Please see discharge summary for a list of discharge medications.  Relevant Imaging Results:  Relevant Lab Results:   Additional Information SSN [id number]  Ross Ludwig, LCSW

## 2020-01-20 NOTE — Progress Notes (Signed)
  Speech Language Pathology Treatment: Dysphagia  Patient Details Name: Robert Richard MRN: [id number] DOB: 16-May-1930 Today's Date: 01/20/2020 Time: 1339-1410 SLP Time Calculation (min) (ACUTE ONLY): 31 min  Assessment / Plan / Recommendation Clinical Impression  Follow up to review MBS with pt and his wife completed concerning pt's oropharyngeal dysphagia.  Showed wife pt's MBS study reviewing clinical reasoning for diet modifications.  However also advised I requested she be able to bring in soft foods from home that he may enjoy with her supervising his po.  Institutionalized feeding advised to continue puree/nectar - to which wife agreed.      Pt grabbed large cup with straw with HOB lowered to approx 45* and presented with moderate cough within 60 seconds following swallow.  SLP advised pt use cups with small bore straws and be seated fully upright for intake.  Demonstration of thickening liquids to nectar reviewed with teach back completed.  Will follow up for dysphagia management with pt and wife.  Intake remains poor and wife reports pt is short of breath today.  Pt sleepy at end of session.      HPI HPI: Pt is an 86 y.o. male with medical history significant for hyperlipidemia, CAD, A. fib on warfarin, GERD, CKD stage III, Parkinson's disease who presented to the ED via EMS after sustaining a fall at home. CXR 11/23: Worsening airspace disease in the left lower lung, suspect for pneumonia or potentially aspiration. Cardiomegaly. Pt was most recently seen by SLP on 03/17/13 and a dysphagia 2 diet with thin liquids was recommended at that time per pt's request d/t thrush. MBS 02/26/13: Pt. exhibited mild oral dysphagia with delayed transit exacerbated by xerostomia and decreased endurance.  Mild-mod sensory pharyngeal dysphagia with reduced tongue base retraction and laryngeal elevation resulting in trace vallecular/pyriform sinus/pharyngeal wall residue.    Concerns for aspiration pna present.       SLP Plan  Continue with current plan of care       Recommendations  Diet recommendations: Dysphagia 1 (puree)nectar-thick liquid (tsps thin ok anytime) Liquids provided via: Cup;Straw Medication Administration: Crushed with puree Supervision: Full supervision/cueing for compensatory strategies Compensations: Slow rate;Small sips/bites;Other (Comment) (po only when fully alert and accepting, assure pt swallows before giving more) Postural Changes and/or Swallow Maneuvers: Seated upright 90 degrees;Upright 30-60 min after meal                Oral Care Recommendations: Oral care BID Follow up Recommendations: Skilled Nursing facility SLP Visit Diagnosis: Dysphagia, oropharyngeal phase (R13.12);Dysphagia, pharyngoesophageal phase (R13.14) Plan: Continue with current plan of care       GO                Macario Golds 01/20/2020, 3:16 PM Kathleen Lime, MS [facility] SLP Acute Rehab Services Office [phone] Pager [phone]

## 2020-01-20 DEATH — deceased

## 2020-01-21 ENCOUNTER — Inpatient Hospital Stay (HOSPITAL_COMMUNITY): Payer: Medicare Other

## 2020-01-21 LAB — COMPREHENSIVE METABOLIC PANEL
ALT: 30 U/L (ref 0–44)
AST: 47 U/L — ABNORMAL HIGH (ref 15–41)
Albumin: 2.4 g/dL — ABNORMAL LOW (ref 3.5–5.0)
Alkaline Phosphatase: 192 U/L — ABNORMAL HIGH (ref 38–126)
Anion gap: 9 (ref 5–15)
BUN: 33 mg/dL — ABNORMAL HIGH (ref 8–23)
CO2: 21 mmol/L — ABNORMAL LOW (ref 22–32)
Calcium: 8 mg/dL — ABNORMAL LOW (ref 8.9–10.3)
Chloride: 112 mmol/L — ABNORMAL HIGH (ref 98–111)
Creatinine, Ser: 1 mg/dL (ref 0.61–1.24)
GFR, Estimated: >60 mL/min (ref >60)
Glucose, Bld: 143 mg/dL — ABNORMAL HIGH (ref 70–99)
Potassium: 3.4 mmol/L — ABNORMAL LOW (ref 3.5–5.1)
Sodium: 142 mmol/L (ref 135–145)
Total Bilirubin: 2 mg/dL — ABNORMAL HIGH (ref 0.3–1.2)
Total Protein: 6.7 g/dL (ref 6.5–8.1)

## 2020-01-21 LAB — CBC
HCT: 24.4 % — ABNORMAL LOW (ref 39.0–52.0)
Hemoglobin: 7.8 g/dL — ABNORMAL LOW (ref 13.0–17.0)
MCH: 29.7 pg (ref 26.0–34.0)
MCHC: 32 g/dL (ref 30.0–36.0)
MCV: 92.8 fL (ref 80.0–100.0)
Platelets: 322 10*3/uL (ref 150–400)
RBC: 2.63 MIL/uL — ABNORMAL LOW (ref 4.22–5.81)
RDW: 14.7 % (ref 11.5–15.5)
WBC: 17 10*3/uL — ABNORMAL HIGH (ref 4.0–10.5)
nRBC: 0 % (ref 0.0–0.2)

## 2020-01-21 LAB — GLUCOSE, CAPILLARY
Glucose-Capillary: 173 mg/dL — ABNORMAL HIGH (ref 70–99)
Glucose-Capillary: 150 mg/dL — ABNORMAL HIGH (ref 70–99)
Glucose-Capillary: 124 mg/dL — ABNORMAL HIGH (ref 70–99)
Glucose-Capillary: 119 mg/dL — ABNORMAL HIGH (ref 70–99)

## 2020-01-21 LAB — PROTIME-INR
INR: 2.2 — ABNORMAL HIGH (ref 0.8–1.2)
Prothrombin Time: 23.9 seconds — ABNORMAL HIGH (ref 11.4–15.2)

## 2020-01-21 LAB — PHOSPHORUS: Phosphorus: 2.8 mg/dL (ref 2.5–4.6)

## 2020-01-21 MED ORDER — PIPERACILLIN-TAZOBACTAM 3.375 G IVPB
3.3750 g | Freq: Three times a day (TID) | INTRAVENOUS | Status: AC
  Administered 2020-01-21 – 2020-01-24 (×9): 3.375 g via INTRAVENOUS
  Filled 2020-01-21 (×9): qty 50

## 2020-01-21 MED ORDER — POTASSIUM CHLORIDE 20 MEQ PO PACK
40.0000 meq | PACK | Freq: Once | ORAL | Status: AC
  Administered 2020-01-21: 40 meq via ORAL
  Filled 2020-01-21: qty 2

## 2020-01-21 NOTE — Progress Notes (Signed)
Occupational Therapy Treatment Patient Details Name: Robert Richard MRN: [id number] DOB: [birth year] Today's Date: 01/21/2020    History of present illness 86 yo male admitted with R hip fracture after falling at home. S/P IM nail 12/30/2019. Transferred to progressive unit after having Afib with RVR. Hx of Parkinson's, A fib, DM, COPD, gout, RLS, CABG, osteoporosis, CKD   OT comments  Patient requiring max to total A x2 for bed mobility and up to mod-max A for sitting balance at EOB. Patient require mod verbal and tactile cues to correct posture and for hand placement to maximize static sitting. Patient able to maintain poor sitting balance for 15-30 second increments at EOB until fatigues or starts holding onto R LE. Recommend continued acute OT services to maximize patient activity tolerance, balance, strength in order to facilitate to D/C venue listed below.   Follow Up Recommendations  SNF    Equipment Recommendations  None recommended by OT       Precautions / Restrictions Precautions Precautions: Fall Restrictions Weight Bearing Restrictions: No RUE Weight Bearing: Weight bearing as tolerated RLE Weight Bearing: Weight bearing as tolerated       Mobility Bed Mobility Overal bed mobility: Needs Assistance Bed Mobility: Supine to Sit;Sit to Supine     Supine to sit: Max assist;+2 for physical assistance;HOB elevated Sit to supine: Total assist;+2 for physical assistance;+2 for safety/equipment   General bed mobility comments: patient initiates mobilizing L LE to EOB, needs assist with R LE and trunk due to posterior lean. total A x2 for LE and trunk management   Transfers                 General transfer comment: deferred, x1 assist available and patient with poor to zero sitting balance     Balance Overall balance assessment: Needs assistance;History of Falls Sitting-balance support: Bilateral upper extremity supported;Feet supported Sitting balance-Leahy Scale:  Zero Sitting balance - Comments: initially zero requiring mod to max A, fluctuates to poor for ~15-30 second increments then patient fatigues/ holds onto R LE with hand minimizing sitting balance  Postural control: Left lateral lean;Posterior lean                                 ADL either performed or assessed with clinical judgement   ADL Overall ADL's : Needs assistance/impaired     Grooming: Wash/dry face;Set up;Bed level Grooming Details (indicate cue type and reason): unable to attempt in sitting as patient has poor to zero sitting balance                              Functional mobility during ADLs: Maximal assistance General ADL Comments: treatment focusing on sitting balance and tolerance necessary for participation in self care, functional transfers. Patient tolerate sitting at EOB for approximately 7 mins with fluctuating balance of poor to zero and mod cues for posture, hand placement                Cognition Arousal/Alertness: Awake/alert Behavior During Therapy: WFL for tasks assessed/performed Overall Cognitive Status: Impaired/Different from baseline Area of Impairment: Orientation;Following commands;Safety/judgement;Problem solving                 Orientation Level: Disoriented to;Place;Time   Memory: Decreased short-term memory Following Commands: Follows one step commands with increased time;Follows one step commands inconsistently Safety/Judgement: Decreased awareness of safety  Problem Solving: Difficulty sequencing;Requires verbal cues;Requires tactile cues General Comments: requires repetitive directions for follow through        Exercises Exercises: General Lower Extremity General Exercises - Lower Extremity Ankle Circles/Pumps: AROM;Both;20 reps;Supine      General Comments HR maintained between 94-105 throughout session     Pertinent Vitals/ Pain       Pain Assessment: Faces Faces Pain Scale: Hurts little  more Pain Location: R LE Pain Descriptors / Indicators: Discomfort;Guarding Pain Intervention(s): Monitored during session         Frequency  Min 2X/week        Progress Toward Goals  OT Goals(current goals can now be found in the care plan section)  Progress towards OT goals: Progressing toward goals  Acute Rehab OT Goals Patient Stated Goal: to walk OT Goal Formulation: With patient Time For Goal Achievement: 02/09/2020 Potential to Achieve Goals: Fair ADL Goals Pt Will Perform Grooming: with supervision;standing Pt Will Perform Upper Body Bathing: sitting;with supervision;with set-up Pt Will Perform Lower Body Bathing: with min assist;sit to/from stand Pt Will Perform Upper Body Dressing: with modified independence;sitting Pt Will Perform Lower Body Dressing: with min assist;sit to/from stand Pt Will Transfer to Toilet: with min assist;ambulating Pt Will Perform Toileting - Clothing Manipulation and hygiene: with min assist;sit to/from stand  Plan Discharge plan remains appropriate       AM-PAC OT "6 Clicks" Daily Activity     Outcome Measure   Help from another person eating meals?: A Little Help from another person taking care of personal grooming?: A Little Help from another person toileting, which includes using toliet, bedpan, or urinal?: Total Help from another person bathing (including washing, rinsing, drying)?: A Lot Help from another person to put on and taking off regular upper body clothing?: A Little Help from another person to put on and taking off regular lower body clothing?: Total 6 Click Score: 13    End of Session  OT Visit Diagnosis: Unsteadiness on feet (R26.81);History of falling (Z91.81)   Activity Tolerance Patient limited by fatigue   Patient Left in bed;with call bell/phone within reach;with bed alarm set;with family/visitor present   Nurse Communication Mobility status        Time: 2863-8177 OT Time Calculation (min): 23  min  Charges: OT General Charges $OT Visit: 1 Visit OT Treatments $Self Care/Home Management : 23-37 mins  Delbert Phenix OT OT pager: [phone]   Rosemary Holms 01/21/2020, 11:09 AM

## 2020-01-21 NOTE — Care Management Important Message (Signed)
Important Message  Patient Details IM Letter given to the Patient. Name: Robert Richard MRN: [id number] Date of Birth: [birth year]   Medicare Important Message Given:  Yes     Kerin Salen 01/21/2020, 12:50 PM

## 2020-01-21 NOTE — Progress Notes (Signed)
   01/21/20 1840  What Happened  Was fall witnessed? Yes  Who witnessed fall? Bard Herbert  Patients activity before fall to/from bed, chair, or stretcher  Point of contact buttocks  Was patient injured? Unsure  Patient found on floor  Found by Staff-comment (staff assisted to floor)  Stated prior activity to/from bed, chair, or stretcher  Follow Up  MD notified Dr. Horace Porteous  Time MD notified [phone]  Family notified Yes - comment  Time family notified 1841  Additional tests Yes-comment  Simple treatment Other (comment) (No treatment at this time. )  Progress note created (see row info) Yes  Adult Fall Risk Assessment  Risk Factor Category (scoring not indicated) Fall has occurred during this admission (document High fall risk)  Patient Fall Risk Level High fall risk  Adult Fall Risk Interventions  Required Bundle Interventions *See Row Information* High fall risk - low, moderate, and high requirements implemented  Additional Interventions Family Supervision;PT/OT need assessed if change in mobility from baseline;Reorient/diversional activities with confused patients;Use of appropriate toileting equipment (bedpan, BSC, etc.)  Screening for Fall Injury Risk (To be completed on HIGH fall risk patients) - Assessing Need for Low Bed  Risk For Fall Injury- Low Bed Criteria 85 years or older;Previous fall this admission;Admitted as a result of a fall  Will Implement Low Bed and Floor Mats Low bed contraindicated, floor mats in place  Specialty Low Bed Contraindicated Hip precautions  Pain Assessment  Pain Scale PAINAD  Faces Pain Scale 6  Pain Location Generalized (pt states all overq)  PAINAD (Pain Assessment in Advanced Dementia)  Breathing 1  Negative Vocalization 1  Facial Expression 1  Body Language 0  Consolability 2  PAINAD Score 5  Neurological  Neuro (WDL) X  Level of Consciousness Alert  Cognition Appropriate at baseline  Speech Appropriate at baseline   Seizure Activity  Psychomotor Symptoms None  Musculoskeletal  Musculoskeletal (WDL) X  Assistive Device [place];Front wheel walker  Generalized Weakness Yes  Weight Bearing Restrictions No  Musculoskeletal Details  RLE Limited movement  Integumentary  Integumentary (WDL) WDL

## 2020-01-21 NOTE — TOC Progression Note (Signed)
Transition of Care [facility]) - Progression Note    Patient Details  Name: Robert Richard MRN: [id number] Date of Birth: 1930-08-17  Transition of Care [facility] CM/SW Contact  Ross Ludwig, [place] Phone Number: 01/21/2020, 3:47 PM  Clinical Narrative:     CSW called patient's wife to present bed offers, patient's wife requested that CSW discuss with her in the morning since she is not at the hospital at this time. CSW to follow up at a later time.   Expected Discharge Plan: Skilled Nursing Facility Barriers to Discharge: Ship broker, Continued Medical Work up  Expected Discharge Plan and Services Expected Discharge Plan: [place] Choice: [place] arrangements for the past 2 months: Single Family Home                                       Social Determinants of Health (SDOH) Interventions    Readmission Risk Interventions No flowsheet data found.

## 2020-01-21 NOTE — Plan of Care (Signed)
  Problem: Education: Goal: Knowledge of General Education information will improve Description: Including pain rating scale, medication(s)/side effects and non-pharmacologic comfort measures Outcome: Progressing   Problem: Clinical Measurements: Goal: Will remain free from infection Outcome: Progressing   Problem: Nutrition: Goal: Adequate nutrition will be maintained Outcome: Progressing   Problem: Coping: Goal: Level of anxiety will decrease Outcome: Progressing   Problem: Elimination: Goal: Will not experience complications related to bowel motility Outcome: Progressing Goal: Will not experience complications related to urinary retention Outcome: Progressing   Problem: Pain Managment: Goal: General experience of comfort will improve Outcome: Progressing   Problem: Safety: Goal: Ability to remain free from injury will improve Outcome: Progressing   Problem: Skin Integrity: Goal: Risk for impaired skin integrity will decrease Outcome: Progressing   Problem: Clinical Measurements: Goal: Postoperative complications will be avoided or minimized Outcome: Progressing   Problem: Pain Management: Goal: Pain level will decrease Outcome: Progressing

## 2020-01-21 NOTE — Progress Notes (Signed)
PROGRESS NOTE    Robert Richard  HER:[id number] DOB: 09/06/1930 DOA: 12/25/2019 PCP: [name], MD   Chief Complaint  Patient presents with  . Fall  . Leg Injury   Brief Narrative:  Patient is 85yo patient with CAD, hyperlipidemia, A. fib on warfarin, GERD, CKD stage III, Parkinson's disease presented to ED after sustaining a fall at home.  History per his wife, patient had gotten out of the shower and while he was in the bathroom, he fell and landed on the right side of his face, right hip.  Patient sustained facial laceration with scalp contusion.  He also complained of right hip pain with difficulty in being able to bear weight on the right leg due to pain.  EMS was called. In ED BP was soft-2/52, and leukocytosis with normocytic anemia.  Creatinine 1.4.  CT imaging showed no acute intracranial abnormality but showed right frontal/supraorbital swelling and laceration extending to the temporal soft tissues, mild right liver contusion and laceration.  No facial bone fracture. Hip x-ray showed right hip fracture He's now s/p surgery with ortho.  Post op course has been complicated by aspiration pneumonia, pseudomonas UTI, anemia requiring transfusion, and delirium.  Palliative care following. Patient is currently alert and oriented x2 and not in any distress.. Patient's right hemithorax still with some diminished breath sounds and crackles.  Will obtain stat chest x-ray.   Assessment & Plan:   Principal Problem:   Closed right hip fracture, initial encounter ([place]) Active Problems:   Hyperlipidemia   CKD (chronic kidney disease)   CAD (coronary artery disease)   Atrial fibrillation with RVR (HCC)   Diabetes mellitus without complication (HCC)   GERD (gastroesophageal reflux disease)   Parkinson's disease ([place])   Facial laceration   Leukocytosis   Hyperglycemia   Fall at home, initial encounter  Goals of care:  Pt is DNR after conversation with palliative care.  Will continue  to monitor course.  Persistent pneumonia, aspiration.  Encephalopathy is slightly worse today.  Discussed with wife continued encephalopathy, aspiration and poor prognosis - I brought up possibility of hospice if he doesn't improve.  Will continue to monitor on abx and with pRBC transfusion today.  Continue goals of care discussions.  Palliative care following, appreciate recs  Clinical problems list  Aspiration Pneumonia  Shortness of Breath  Volume Overloaded CXR 11/23 with worsening airspace disease in LLL, concerning for pneumonia vs aspiration 11/25 CXR with persistent L mid and L lower lung airspace disease, new airspace densities within the RU and RL lung CXR 11/26 with multifocal pneumonia, worsened in the R lung Continues on room air and still tachypneic.   11/30 CXR with persistent bilateral upper lung zone predominant air space opacities, stable R effusion Continue antibiotics -> zosyn (11/25 - present).   Obtain chest x-ray today 01/21/2020.  Patient need to continue antibiotics Zosyn. Continue daily lasix.  Noted with elevated BNP Blood cultures showed no growth to date  SLP eval with mild/moderate aspiration risk -> dysphagia 1 diet,  nectar thick liquid. Oropharyngeal exam showed left tonsillar exudate possibly contributing to the ongoing infectious process. Patient is currently receiving Zosyn antibiotics.   Acute on chronic blood loss anemia, postop -Hemoglobin 7.2 on 11/22, status post 2 units packed RBC transfusion - transfused 1 unit pRBC 11/28 - transfused 1 unit pRBC on 11/30 - Relatively stable, labs with iron def anemia -Continue with supplemental iron Monitor CBC and transfuse if hemoglobin is less than 7.0  Pseudomonas  UTI Urine cx with pansensitive pseudomonas Continue zosyn  Acute Metabolic Encephalopathy: suspect 2/2 possible aspiration pneumonia versus urinary tract infection versus hospital-acquired delirium  Repeat head CT 11/24 without acute  intracranial abnormality Delirium precautions  Treat aspiration pneumonia and pseudomonas UTI Mental status appears to be improving as patient is alert and oriented x3.  Acute comminuted closed right hip fracture, after mechanical fall at home [facility]) -Right hip x-ray showed acute comminuted intertrochanteric fracture of the right hip with superior subluxation -Underwent intramedullary fixation of the right femur (Dr [name]), on 36/64 -Complicated by anemia and A. fib with RVR on 11/22, doing well today, will start PT - Holding warfarin now - follow for resumption  - therapy recommending SNF  Chronic atrial fibrillation with RVR -Postop in atrial fibrillation with RVR on 11/22 requiring digoxin, IV Cardizem. Resumed oral Cardizem 240 mg daily -> transitioned to Q6 due to need to crush meds -coumadin on hold with need for recurrent transfusion - follow for resumption   Right frontal/supraorbital swelling and laceration -Continue wound care, no active bleeding  Leukocytosis  Rising   Diabetes mellitus type 2, diet controlled -Hemoglobin A1c 6.5 08/09/2019 -Continue carb modified diet Fingersticks twice daily  CKD stageIIIa  Noniron metabolic acidosis -Baseline creatinine ~1.5 -Avoid nephrotoxic medications, hypotension Topamax on hold due to [place]  -Continue with sodium bicarb monitor  Elevated LFTs:  Continue to monitor  CAD, hyperlipidemia Continue Crestor  GERD -Continue Protonix   Parkinson's disease Continue Sinemet  Hyperglycemia: ssi, follow A1c (5.5)  Elevated Bili  LFTs: mild, continue to monitor  Underweight, moderate protein calorie malnutrition Estimated body mass index is 18.62 kg/m. Nutritional supplements  DVT prophylaxis: Warfarin Code Status: DNR Family Communication:  Wife at bedside Disposition:   Status is: Inpatient  Remains inpatient appropriate because:Inpatient level of care appropriate due to severity of  illness   Dispo: The patient is from: Home              Anticipated d/c is to: SNF              Anticipated d/c date is: > 3 days              Patient currently is not medically stable to d/c.       Consultants:   Palliative care  Cardiology  ortho  Procedures:  intramedullary fixation of the right femur (Dr [name]), on 11/21  Antimicrobials:  Anti-infectives (From admission, onward)   Start     Dose/Rate Route Frequency Ordered Stop   01/21/20 1000  piperacillin-tazobactam (ZOSYN) IVPB 3.375 g        3.375 g 12.5 mL/hr over 240 Minutes Intravenous Every 8 hours 01/21/20 0751 01/24/20 0959   01/14/20 0830  piperacillin-tazobactam (ZOSYN) IVPB 3.375 g        3.375 g 12.5 mL/hr over 240 Minutes Intravenous Every 8 hours 01/14/20 0806 01/20/20 2359   01/12/20 2100  vancomycin (VANCOCIN) IVPB 1000 mg/200 mL premix        1,000 mg 200 mL/hr over 60 Minutes Intravenous  Once 01/12/20 2001 01/13/20 0548   01/12/20 2030  cefTRIAXone (ROCEPHIN) 1 g in sodium chloride 0.9 % 100 mL IVPB  Status:  Discontinued        1 g 200 mL/hr over 30 Minutes Intravenous Every 24 hours 01/12/20 1930 01/14/20 0756   12/22/2019 1500  ceFAZolin (ANCEF) IVPB 2g/100 mL premix        2 g 200 mL/hr over 30 Minutes Intravenous Every 6  hours 01/09/2020 1113 01/18/2020 2141     Subjective: Denies complaints Confused today, worse than yesterday  Objective: Vitals:   01/21/20 0614 01/21/20 0627 01/21/20 0637 01/21/20 1240  BP: 136/70   (!) 147/51  Pulse: 82   74  Resp: 20   19  Temp: 98.4 F (36.9 C)   98.4 F (36.9 C)  TempSrc: Oral   Oral  SpO2: 93%  92% 95%  Weight:  65.2 kg    Height:  6\' 2"  (1.88 m)      Intake/Output Summary (Last 24 hours) at 01/21/2020 1621 Last data filed at 01/21/2020 1100 Gross per 24 hour  Intake 543.67 ml  Output 1100 ml  Net -556.33 ml   Filed Weights   01/19/20 0500 01/20/20 0700 01/21/20 0627  Weight: 65.8 kg 66.6 kg 65.2 kg    Examination:  General:  No acute distress he is alert and oriented x3 with intermittent cognitive impairment. HEENT: Left tonsillar erythema plus exudate Cardiovascular: Heart sounds show a regular rate, and rhythm with systolic ejection murmur Lungs: coarse breath sounds diminished breath sounds right mid and lower zones Abdomen: Soft, nontender, nondistended Neurological: Alert and oriented 3. Moves all extremities 4. Cranial nerves II through XII grossly intact. Skin: Warm and dry.  Right facial abrasion covered with Band-Aid. Extremities: RLE with intact dressing - edematous, no notable echymoses, soft      Data Reviewed: I have personally reviewed following labs and imaging studies  CBC: Recent Labs  Lab 01/16/20 0453 01/16/20 0453 01/17/20 0558 01/17/20 0558 01/18/20 0419 01/19/20 0514 01/19/20 1808 01/20/20 0456 01/21/20 0432  WBC 13.8*   < > 14.6*  --  15.4* 16.3*  --  17.9* 17.0*  NEUTROABS 10.8*  --  11.2*  --  12.0* 12.4*  --  14.2*  --   HGB 7.4*   < > 7.0*   < > 7.4* 6.4* 8.3* 7.7* 7.8*  HCT 23.2*   < > 21.7*   < > 23.2* 20.0* 25.1* 23.7* 24.4*  MCV 92.8   < > 93.5  --  92.1 91.7  --  90.8 92.8  PLT 197   < > 233  --  251 258  --  288 322   < > = values in this interval not displayed.    Basic Metabolic Panel: Recent Labs  Lab 01/17/20 0558 01/18/20 0419 01/19/20 0514 01/20/20 0456 01/20/20 1942 01/21/20 0432  NA 138 140 140 141  --  142  K 4.0 3.8 3.7 3.5  --  3.4*  CL 111 111 111 111  --  112*  CO2 17* 18* 19* 20*  --  21*  GLUCOSE 136* 151* 148* 135*  --  143*  BUN 29* 29* 34* 34*  --  33*  CREATININE 1.47* 1.41* 1.42* 1.37*  --  1.00  CALCIUM 7.9* 7.8* 7.8* 8.0*  --  8.0*  MG 2.2 2.2 2.3 2.3 2.3  --   PHOS 2.7 2.7 2.7 3.0 2.6  --     GFR: Estimated Creatinine Clearance: 48 mL/min (by C-G formula based on SCr of 1 mg/dL).  Liver Function Tests: Recent Labs  Lab 01/17/20 0558 01/18/20 0419 01/19/20 0514 01/20/20 0456 01/21/20 0432  AST 59* 52* 52* 52* 47*   ALT 16 21 27 25 30   ALKPHOS 139* 140* 146* 168* 192*  BILITOT 1.5* 1.7* 1.6* 2.6* 2.0*  PROT 6.4* 6.2* 6.1* 6.5 6.7  ALBUMIN 2.4* 2.4* 2.4* 2.4* 2.4*    CBG: Recent Labs  Lab 01/20/20 1137 01/20/20 1708 01/20/20 2138 01/21/20 0801 01/21/20 1243  GLUCAP 155* 128* 190* 173* 150*     Recent Results (from the past 240 hour(s))  Culture, blood (routine x 2)     Status: None   Collection Time: 01/12/20  7:21 PM   Specimen: BLOOD  Result Value Ref Range Status   Specimen Description   Final    BLOOD RIGHT ANTECUBITAL Performed at [facility] [address] [place], [place] [address]    Special Requests   Final    BOTTLES DRAWN AEROBIC AND ANAEROBIC Blood Culture adequate volume Performed at [facility] [address], [place], [place] [address]    Culture   Final    NO GROWTH 5 DAYS Performed at [facility], [place] [address], [place], [place] [address]    Report Status 01/17/2020 FINAL  Final  Culture, blood (routine x 2)     Status: None   Collection Time: 01/12/20  7:22 PM   Specimen: BLOOD  Result Value Ref Range Status   Specimen Description   Final    BLOOD LEFT ARM Performed at [facility] [address], [place], [place] [address]    Special Requests   Final    BOTTLES DRAWN AEROBIC ONLY Blood Culture adequate volume Performed at [facility] [address] [place], [place] [address]    Culture   Final    NO GROWTH 5 DAYS Performed at [facility], [place] [address] [place], [place] [address]    Report Status 01/17/2020 FINAL  Final  Culture, Urine     Status: Abnormal   Collection Time: 01/13/20  1:20 AM   Specimen: Urine, Random  Result Value Ref Range Status   Specimen Description   Final    URINE, RANDOM Performed at [facility] [address] [place],  [address]    Special Requests   Final    NONE Performed at [facility], [place] [address], [place], [place] [address]    Culture 50,000 COLONIES/mL PSEUDOMONAS AERUGINOSA (A)  Final   Report Status 01/15/2020 FINAL  Final   Organism ID, Bacteria PSEUDOMONAS AERUGINOSA (A)  Final      Susceptibility   Pseudomonas aeruginosa - MIC*    CEFTAZIDIME 2 SENSITIVE Sensitive     CIPROFLOXACIN 0.5 SENSITIVE Sensitive     GENTAMICIN <=1 SENSITIVE Sensitive     IMIPENEM 2 SENSITIVE Sensitive     PIP/TAZO <=4 SENSITIVE Sensitive     CEFEPIME 2 SENSITIVE Sensitive     * 50,000 COLONIES/mL PSEUDOMONAS AERUGINOSA         Radiology Studies: No results found.      Scheduled Meds: . sodium chloride   Intravenous Once  . carbidopa-levodopa  1 tablet Oral TID  . diltiazem  60 mg Oral Q6H  . docusate  100 mg Oral BID  . feeding supplement  237 mL Oral BID BM  . ferrous sulfate  325 mg Oral Q breakfast  . furosemide  20 mg Intravenous Daily  . insulin aspart  0-9 Units Subcutaneous TID WC  . ketotifen  1 drop Both Eyes Daily  . lidocaine   Inhalation QHS  . multivitamin with minerals   Oral Daily  . nystatin  5 mL Oral QID  . pantoprazole sodium  40 mg Oral BID  . rosuvastatin  2.5 mg Oral QODAY  . sodium bicarbonate  650 mg  Oral BID   Continuous Infusions: . piperacillin-tazobactam (ZOSYN)  IV 3.375 g (01/21/20 0858)     LOS: 12 days    Time spent: over 51 min    Elie Confer, MD [facility] [phone]   .  01/21/2020, 4:21 PM

## 2020-01-21 NOTE — Plan of Care (Signed)
  Problem: Activity: Goal: Risk for activity intolerance will decrease Outcome: Progressing Note: Patient remains very weak. Patient needed max assistance to and from the chair. Patient had an assisted fall as well.    Problem: Nutrition: Goal: Adequate nutrition will be maintained Outcome: Progressing Note: Patient tolerating current dysphagia diet plan.    Problem: Elimination: Goal: Will not experience complications related to bowel motility Outcome: Progressing Note: Patient adequate urine output during this shit.    Problem: Pain Managment: Goal: General experience of comfort will improve Outcome: Progressing Note: Patient's pain has been managed through IV Morphine which was given close after the fall.    Problem: Safety: Goal: Ability to remain free from injury will improve Outcome: Progressing Note: Patient has fall precautions in place at this time.    Problem: Skin Integrity: Goal: Risk for impaired skin integrity will decrease Outcome: Progressing Note: Patient's skin remains intact. Patient's hip dressing is in place and is dry and intact.

## 2020-01-21 NOTE — Progress Notes (Signed)
Physical Therapy Treatment Patient Details Name: KOJI NIEHOFF MRN: [id number] DOB: [birth year] Today's Date: 01/21/2020    History of Present Illness 86 yo male admitted with R hip fracture after falling at home. S/P IM nail 01/05/2020. Transferred to progressive unit after having Afib with RVR. Hx of Parkinson's, A fib, DM, COPD, gout, RLS, CABG, osteoporosis, CKD    PT Comments    POD #11 Assisted OOB to attempt amb.  General bed mobility comments: pt required + 2 Total Assist to transfer from supine to EOB.  Pt <5%.  Poor sitting posture and balance.  Severe LEFT lean.  Pt repeating "I need to lay down".General transfer comment: pt in bed incont of BM.  First assisted to [facility] via "Bear Hug" 1/4 pivot towards his LEFT.  Required + 3 assist to rise from [facility] and perform proper hygiene BM.  Pt repeated "I have to dit down".  Poor forward flex standing posture, narrow BOS and limited stance time with tremors and severe posterior lean.  BSC switched with recliner from behind and positioned to comfort with multiple pillows. Lunch tray untouched at bedside.  Pt only wants water but he is strict Nectar Thicken.  Does not seem to be eating much.  Appears weaker with each visit.   Follow Up Recommendations  SNF     Equipment Recommendations  None recommended by PT    Recommendations for Other Services       Precautions / Restrictions Precautions Precautions: Fall Precaution Comments: Hx Parkinsons Restrictions Weight Bearing Restrictions: No RUE Weight Bearing: Weight bearing as tolerated    Mobility  Bed Mobility Overal bed mobility: Needs Assistance Bed Mobility: Supine to Sit     Supine to sit: Max assist;+2 for physical assistance;HOB elevated     General bed mobility comments: pt required + 2 Total Assist to transfer from supine to EOB.  Pt <5%.  Poor sitting posture and balance.  Severe LEFT lean.  Pt repeating "I need to lay down".  Transfers Overall transfer level: Needs  assistance Equipment used: Rolling walker (2 wheeled);None   Sit to Stand: Max assist;+2 physical assistance;+2 safety/equipment;From elevated surface Stand pivot transfers: Total assist;+2 physical assistance;+2 safety/equipment;From elevated surface       General transfer comment: pt in bed incont of BM.  First assisted to [facility] via "Bear Hug" 1/4 pivot towards his LEFT.  Required + 3 assist to rise from [facility] and perform proper hygiene BM.  Pt repeated "I have to dit down".  Poor forward flex standing posture, narrow BOS and limited stance time with tremors and severe posterior lean.  BSC switched with recliner from behind and positioned to comfort with multiple pillows.  Ambulation/Gait             General Gait Details: NT-pt unable   Stairs             Wheelchair Mobility    Modified Rankin (Stroke Patients Only)       Balance                                            Cognition Arousal/Alertness: Awake/alert Behavior During Therapy: Flat affect Overall Cognitive Status: No family/caregiver present to determine baseline cognitive functioning  General Comments: requires repetitive directions but following commands and able to express needs      Exercises      General Comments        Pertinent Vitals/Pain Pain Assessment: Faces Faces Pain Scale: Hurts even more Pain Location: R hip with activity Pain Descriptors / Indicators: Discomfort;Guarding;Operative site guarding Pain Intervention(s): Monitored during session;Repositioned    Home Living                      Prior Function            PT Goals (current goals can now be found in the care plan section) Progress towards PT goals: Progressing toward goals    Frequency    Min 2X/week      PT Plan Current plan remains appropriate    Co-evaluation              AM-PAC PT "6 Clicks" Mobility   Outcome Measure   Help needed turning from your back to your side while in a flat bed without using bedrails?: Total Help needed moving from lying on your back to sitting on the side of a flat bed without using bedrails?: Total Help needed moving to and from a bed to a chair (including a wheelchair)?: Total Help needed standing up from a chair using your arms (e.g., wheelchair or bedside chair)?: Total Help needed to walk in hospital room?: Total Help needed climbing 3-5 steps with a railing? : Total 6 Click Score: 6    End of Session Equipment Utilized During Treatment: Gait belt Activity Tolerance: Patient limited by fatigue;Patient limited by pain Patient left: in chair;with call bell/phone within reach;with chair alarm set Nurse Communication: Need for lift equipment;Mobility status PT Visit Diagnosis: Pain;Muscle weakness (generalized) (M62.81);History of falling (Z91.81);Difficulty in walking, not elsewhere classified (R26.2) Pain - Right/Left: Right Pain - part of body: Hip     Time: 1655-3748 PT Time Calculation (min) (ACUTE ONLY): 28 min  Charges:  $Therapeutic Activity: 23-37 mins                     Rica Koyanagi  PTA Acute  Rehabilitation Services Pager      [phone] Office      [phone]

## 2020-01-22 ENCOUNTER — Inpatient Hospital Stay (HOSPITAL_COMMUNITY): Payer: Medicare Other

## 2020-01-22 LAB — PROTIME-INR
INR: 2.1 — ABNORMAL HIGH (ref 0.8–1.2)
Prothrombin Time: 23.1 seconds — ABNORMAL HIGH (ref 11.4–15.2)

## 2020-01-22 LAB — CBC WITH DIFFERENTIAL/PLATELET
Abs Immature Granulocytes: 0.1 10*3/uL — ABNORMAL HIGH (ref 0.00–0.07)
Basophils Absolute: 0.1 10*3/uL (ref 0.0–0.1)
Basophils Relative: 1 %
Eosinophils Absolute: 0.3 10*3/uL (ref 0.0–0.5)
Eosinophils Relative: 2 %
HCT: 23.2 % — ABNORMAL LOW (ref 39.0–52.0)
Hemoglobin: 7.6 g/dL — ABNORMAL LOW (ref 13.0–17.0)
Immature Granulocytes: 1 %
Lymphocytes Relative: 8 %
Lymphs Abs: 1.2 10*3/uL (ref 0.7–4.0)
MCH: 29.3 pg (ref 26.0–34.0)
MCHC: 32.8 g/dL (ref 30.0–36.0)
MCV: 89.6 fL (ref 80.0–100.0)
Monocytes Absolute: 1.5 10*3/uL — ABNORMAL HIGH (ref 0.1–1.0)
Monocytes Relative: 10 %
Neutro Abs: 11.9 10*3/uL — ABNORMAL HIGH (ref 1.7–7.7)
Neutrophils Relative %: 78 %
Platelets: 333 10*3/uL (ref 150–400)
RBC: 2.59 MIL/uL — ABNORMAL LOW (ref 4.22–5.81)
RDW: 14.8 % (ref 11.5–15.5)
WBC: 15.1 10*3/uL — ABNORMAL HIGH (ref 4.0–10.5)
nRBC: 0 % (ref 0.0–0.2)

## 2020-01-22 LAB — COMPREHENSIVE METABOLIC PANEL
ALT: 11 U/L (ref 0–44)
AST: 38 U/L (ref 15–41)
Albumin: 2.2 g/dL — ABNORMAL LOW (ref 3.5–5.0)
Alkaline Phosphatase: 165 U/L — ABNORMAL HIGH (ref 38–126)
Anion gap: 12 (ref 5–15)
BUN: 30 mg/dL — ABNORMAL HIGH (ref 8–23)
CO2: 19 mmol/L — ABNORMAL LOW (ref 22–32)
Calcium: 8.1 mg/dL — ABNORMAL LOW (ref 8.9–10.3)
Chloride: 111 mmol/L (ref 98–111)
Creatinine, Ser: 1.34 mg/dL — ABNORMAL HIGH (ref 0.61–1.24)
GFR, Estimated: 51 mL/min — ABNORMAL LOW (ref >60)
Glucose, Bld: 124 mg/dL — ABNORMAL HIGH (ref 70–99)
Potassium: 3.8 mmol/L (ref 3.5–5.1)
Sodium: 142 mmol/L (ref 135–145)
Total Bilirubin: 2 mg/dL — ABNORMAL HIGH (ref 0.3–1.2)
Total Protein: 6.5 g/dL (ref 6.5–8.1)

## 2020-01-22 LAB — GLUCOSE, CAPILLARY
Glucose-Capillary: 132 mg/dL — ABNORMAL HIGH (ref 70–99)
Glucose-Capillary: 128 mg/dL — ABNORMAL HIGH (ref 70–99)
Glucose-Capillary: 145 mg/dL — ABNORMAL HIGH (ref 70–99)

## 2020-01-22 LAB — MAGNESIUM: Magnesium: 2.3 mg/dL (ref 1.7–2.4)

## 2020-01-22 NOTE — Progress Notes (Signed)
PROGRESS NOTE    Robert Richard  HUD:[id number] DOB: [birth year] DOA: 12/22/2019 PCP: [name], MD   Chief Complain: Fall  Brief Narrative: Patient is 86 year old male with history of coronary disease, hyperlipidemia, A. fib on warfarin, GERD, CKD stage III, Parkinson's disease, dementia who presented to the emergency department after falling at home.  Patient also sustained facial laceration with a scalp contusion after fall.  He was complaining of right hip pain.  X-ray of the hip on presentation showed right hip fracture.  He was seen by orthopedics and he underwent intramedullary fixation of the right femur .  Hospital course also remarkable for aspiration pneumonia, UTI from Pseudomonas and another fall on 01/21/2020.  Patient is currently waiting for discharge to skilled nursing facility.  Assessment & Plan:   Principal Problem:   Closed right hip fracture, initial encounter ([place]) Active Problems:   Hyperlipidemia   CKD (chronic kidney disease)   CAD (coronary artery disease)   Atrial fibrillation with RVR (HCC)   Diabetes mellitus without complication (HCC)   GERD (gastroesophageal reflux disease)   Parkinson's disease ([place])   Facial laceration   Leukocytosis   Hyperglycemia   Fall at home, initial encounter   Acute comminuted closed right hip fracture/fall: Seen by orthopedics and he underwent intramedullary fixation of the right femur on 11/21.  Hospital course complicated by anemia.  He will follow-up with orthopedics as an outpatient. Patient seen by PT/OT and recommended skilled nursing facility.  Fall: There was report of fall on 01/21/2020.  As per the request of wife, I have ordered x-rays of the pelvis and knees.  There is no obvious dislocation or fracture suspected on physical examination  Aspiration pneumonia: Currently on room air.  Hospital course remarkable for aspiration.  Imagings done during this hospitalization showed multifocal pneumonia.  Respiratory  status has been improving.  Currently on Zosyn.  Also on Lasix for suspicion of volume overload.  Pseudomonas UTI: Currently on Zosyn.  Chronic A. fib: Hospital course remarkable for RVR.  Currently rate is controlled.  He required IV digoxin, IV Cardizem during this hospitalization.  Currently on oral Cardizem.  He is on Coumadin at home for anticoagulation which is on hold due to anemia.  Will discuss with family about the risks of being on warfarin at this time , high risk for fall.  The best decision would be to hold the warfarin indefinitely.  Normocytic anemia: Could be associated with blood loss from surgery.  Currently hemoglobin in the range of 7.  He was transfused with 4 units of PRBC during this hospitalization.  Continue to monitor CBC.  CKD stage III: Currently kidney function at baseline.  Continue to monitor intermittently  Coronary artery disease/hyperlipidemia: Currently stable.  Continue Crestor  Acute metabolic encephalopathy/dementia/Parkinson's disease: On Sinemet.  Frequently confused at baseline.  Continue supportive care.  Delirium precautions.  Currently mental status at baseline.  Diabetes type 2: Hemoglobin A1c of 6.5 as per 6/21.  Continue to monitor blood sugars.  Goals of care: Very elderly patient with dementia, Parkinson's disease and other multiple comorbidities. Palliative care was involved during this hospitalization. Currently he is DNR.  He will follow-up with palliative care as an outpatient.           DVT prophylaxis: warfarin Code Status: DNR Family Communication: Wife at bedside Status is: Inpatient  Remains inpatient appropriate because:Inpatient level of care appropriate due to severity of illness   Dispo: The patient is from: Home  Anticipated d/c is to: SNF              Anticipated d/c date is: 2 days              Patient currently is not medically stable to d/c.     Consultants: Cardiology, orthopedics  Procedures:  ORIF  Antimicrobials:  Anti-infectives (From admission, onward)   Start     Dose/Rate Route Frequency Ordered Stop   01/21/20 1000  piperacillin-tazobactam (ZOSYN) IVPB 3.375 g        3.375 g 12.5 mL/hr over 240 Minutes Intravenous Every 8 hours 01/21/20 0751 01/24/20 0959   01/14/20 0830  piperacillin-tazobactam (ZOSYN) IVPB 3.375 g        3.375 g 12.5 mL/hr over 240 Minutes Intravenous Every 8 hours 01/14/20 0806 01/20/20 2359   01/12/20 2100  vancomycin (VANCOCIN) IVPB 1000 mg/200 mL premix        1,000 mg 200 mL/hr over 60 Minutes Intravenous  Once 01/12/20 2001 01/13/20 0548   01/12/20 2030  cefTRIAXone (ROCEPHIN) 1 g in sodium chloride 0.9 % 100 mL IVPB  Status:  Discontinued        1 g 200 mL/hr over 30 Minutes Intravenous Every 24 hours 01/12/20 1930 01/14/20 0756   01/18/2020 1500  ceFAZolin (ANCEF) IVPB 2g/100 mL premix        2 g 200 mL/hr over 30 Minutes Intravenous Every 6 hours 01/17/2020 1113 01/19/2020 2141      Subjective:  Patient seen and examined at the bedside this morning.  Currently he is hemodynamically stable.  Wife at the bedside.  He complains of some pain but he is not sure where the pain is.  Looks overall comfortable.  On room air.  Objective: Vitals:   01/21/20 2151 01/21/20 2330 01/21/20 2332 01/22/20 0520  BP: 136/65 (!) 133/51 (!) 133/51 127/61  Pulse: 92  85 84  Resp: (!) 24  (!) 24 (!) 22  Temp: 99.1 F (37.3 C)  98.2 F (36.8 C) 98.4 F (36.9 C)  TempSrc: Oral  Oral Oral  SpO2: 93%  94% 94%  Weight:    63.8 kg  Height:    6\' 2"  (1.88 m)    Intake/Output Summary (Last 24 hours) at 01/22/2020 0957 Last data filed at 01/22/2020 0600 Gross per 24 hour  Intake 533.92 ml  Output 1400 ml  Net -866.08 ml   Filed Weights   01/20/20 0700 01/21/20 0627 01/22/20 0520  Weight: 66.6 kg 65.2 kg 63.8 kg    Examination:  General exam: Very deconditioned, debilitated elderly male HEENT:PERRL,Oral mucosa moist, Ear/Nose normal on gross  exam Respiratory system: Few crackles in bilateral bases but mostly clear Cardiovascular system: Irregularly irregular rhythm. No JVD, murmurs, rubs, gallops or clicks. No pedal edema. Gastrointestinal system: Abdomen is nondistended, soft and nontender. No organomegaly or masses felt. Normal bowel sounds heard. Central nervous system: Alert and awake but not oriented.  Extremities: No edema, no clubbing ,no cyanosis, clean surgical wound on the right hip Skin: No rashes, lesions or ulcers,no icterus ,no pallor   Data Reviewed: I have personally reviewed following labs and imaging studies  CBC: Recent Labs  Lab 01/17/20 0558 01/17/20 0558 01/18/20 0419 01/18/20 0419 01/19/20 0514 01/19/20 1808 01/20/20 0456 01/21/20 0432 01/22/20 0422  WBC 14.6*   < > 15.4*  --  16.3*  --  17.9* 17.0* 15.1*  NEUTROABS 11.2*  --  12.0*  --  12.4*  --  14.2*  --  11.9*  HGB 7.0*   < > 7.4*   < > 6.4* 8.3* 7.7* 7.8* 7.6*  HCT 21.7*   < > 23.2*   < > 20.0* 25.1* 23.7* 24.4* 23.2*  MCV 93.5   < > 92.1  --  91.7  --  90.8 92.8 89.6  PLT 233   < > 251  --  258  --  288 322 333   < > = values in this interval not displayed.   Basic Metabolic Panel: Recent Labs  Lab 01/18/20 0419 01/19/20 0514 01/20/20 0456 01/20/20 1942 01/21/20 0432 01/22/20 0422  NA 140 140 141  --  142 142  K 3.8 3.7 3.5  --  3.4* 3.8  CL 111 111 111  --  112* 111  CO2 18* 19* 20*  --  21* 19*  GLUCOSE 151* 148* 135*  --  143* 124*  BUN 29* 34* 34*  --  33* 30*  CREATININE 1.41* 1.42* 1.37*  --  1.00 1.34*  CALCIUM 7.8* 7.8* 8.0*  --  8.0* 8.1*  MG 2.2 2.3 2.3 2.3  --  2.3  PHOS 2.7 2.7 3.0 2.6 2.8  --    GFR: Estimated Creatinine Clearance: 35 mL/min (A) (by C-G formula based on SCr of 1.34 mg/dL (H)). Liver Function Tests: Recent Labs  Lab 01/18/20 0419 01/19/20 0514 01/20/20 0456 01/21/20 0432 01/22/20 0422  AST 52* 52* 52* 47* 38  ALT 21 27 25 30 11   ALKPHOS 140* 146* 168* 192* 165*  BILITOT 1.7* 1.6*  2.6* 2.0* 2.0*  PROT 6.2* 6.1* 6.5 6.7 6.5  ALBUMIN 2.4* 2.4* 2.4* 2.4* 2.2*   No results for input(s): LIPASE, AMYLASE in the last 168 hours. No results for input(s): AMMONIA in the last 168 hours. Coagulation Profile: Recent Labs  Lab 01/18/20 0419 01/19/20 0514 01/20/20 0456 01/21/20 0432 01/22/20 0422  INR 2.6* 2.1* 2.4* 2.2* 2.1*   Cardiac Enzymes: No results for input(s): CKTOTAL, CKMB, CKMBINDEX, TROPONINI in the last 168 hours. BNP (last 3 results) No results for input(s): PROBNP in the last 8760 hours. HbA1C: No results for input(s): HGBA1C in the last 72 hours. CBG: Recent Labs  Lab 01/20/20 2138 01/21/20 0801 01/21/20 1243 01/21/20 1648 01/21/20 2157  GLUCAP 190* 173* 150* 124* 119*   Lipid Profile: No results for input(s): CHOL, HDL, LDLCALC, TRIG, CHOLHDL, LDLDIRECT in the last 72 hours. Thyroid Function Tests: No results for input(s): TSH, T4TOTAL, FREET4, T3FREE, THYROIDAB in the last 72 hours. Anemia Panel: No results for input(s): VITAMINB12, FOLATE, FERRITIN, TIBC, IRON, RETICCTPCT in the last 72 hours. Sepsis Labs: No results for input(s): PROCALCITON, LATICACIDVEN in the last 168 hours.  Recent Results (from the past 240 hour(s))  Culture, blood (routine x 2)     Status: None   Collection Time: 01/12/20  7:21 PM   Specimen: BLOOD  Result Value Ref Range Status   Specimen Description   Final    BLOOD RIGHT ANTECUBITAL Performed at [facility] [address] [place], [place] [address]    Special Requests   Final    BOTTLES DRAWN AEROBIC AND ANAEROBIC Blood Culture adequate volume Performed at [facility] [address], [place], [place] [address]    Culture   Final    NO GROWTH 5 DAYS Performed at [facility], [place] [address] [place], [place] [address]    Report Status 01/17/2020 FINAL  Final  Culture, blood (routine x 2)     Status:  None   Collection Time: 01/12/20  7:22 PM   Specimen:  BLOOD  Result Value Ref Range Status   Specimen Description   Final    BLOOD LEFT ARM Performed at [facility] [address] [place], [place] [address]    Special Requests   Final    BOTTLES DRAWN AEROBIC ONLY Blood Culture adequate volume Performed at [facility] [address], [place], [place] [address]    Culture   Final    NO GROWTH 5 DAYS Performed at [facility], [place] [address], [place], [place] [address]    Report Status 01/17/2020 FINAL  Final  Culture, Urine     Status: Abnormal   Collection Time: 01/13/20  1:20 AM   Specimen: Urine, Random  Result Value Ref Range Status   Specimen Description   Final    URINE, RANDOM Performed at [facility] [address] [place], [place] [address]    Special Requests   Final    NONE Performed at [facility], [place] [address], [place], [place] [address]    Culture 50,000 COLONIES/mL PSEUDOMONAS AERUGINOSA (A)  Final   Report Status 01/15/2020 FINAL  Final   Organism ID, Bacteria PSEUDOMONAS AERUGINOSA (A)  Final      Susceptibility   Pseudomonas aeruginosa - MIC*    CEFTAZIDIME 2 SENSITIVE Sensitive     CIPROFLOXACIN 0.5 SENSITIVE Sensitive     GENTAMICIN <=1 SENSITIVE Sensitive     IMIPENEM 2 SENSITIVE Sensitive     PIP/TAZO <=4 SENSITIVE Sensitive     CEFEPIME 2 SENSITIVE Sensitive     * 50,000 COLONIES/mL PSEUDOMONAS AERUGINOSA         Radiology Studies: DG Chest 1 View  Result Date: 01/21/2020 CLINICAL DATA:  Dyspnea on exertion EXAM: CHEST  1 VIEW COMPARISON:  01/19/2020 chest radiograph. FINDINGS: Intact sternotomy wires. Aortic valve prosthesis in place. Stable cardiomediastinal silhouette with mild cardiomegaly. No pneumothorax. Trace right pleural effusion, similar. No significant left pleural effusion. Patchy diffuse bilateral lung opacities, most prominent in the upper lungs, stable to slightly improved. IMPRESSION: 1.  Stable to slightly improved patchy diffuse bilateral lung opacities, most prominent in the upper lungs, favoring multilobar pneumonia. 2. Stable trace right pleural effusion. 3. Stable mild cardiomegaly. Electronically Signed   By: Ilona Sorrel M.D.   On: 01/21/2020 17:09        Scheduled Meds: . sodium chloride   Intravenous Once  . carbidopa-levodopa  1 tablet Oral TID  . diltiazem  60 mg Oral Q6H  . docusate  100 mg Oral BID  . feeding supplement  237 mL Oral BID BM  . ferrous sulfate  325 mg Oral Q breakfast  . furosemide  20 mg Intravenous Daily  . insulin aspart  0-9 Units Subcutaneous TID WC  . ketotifen  1 drop Both Eyes Daily  . lidocaine   Inhalation QHS  . multivitamin with minerals   Oral Daily  . nystatin  5 mL Oral QID  . pantoprazole sodium  40 mg Oral BID  . rosuvastatin  2.5 mg Oral QODAY  . sodium bicarbonate  650 mg Oral BID   Continuous Infusions: . piperacillin-tazobactam (ZOSYN)  IV 3.375 g (01/22/20 0228)     LOS: 13 days    Time spent:35 mins, More than 50% of that time was spent in counseling and/or coordination of care.      Shelly Coss, MD [facility] P12/04/2019, 9:57 AM

## 2020-01-22 NOTE — Progress Notes (Signed)
Speech Language Pathology Treatment: Dysphagia  Patient Details Name: Robert Richard MRN: [id number] DOB: 1930/03/31 Today's Date: 01/22/2020 Time: 3382-5053 SLP Time Calculation (min) (ACUTE ONLY): 33 min  Assessment / Plan / Recommendation Clinical Impression  Pt today awake in bed, sitting upright upon entrance into room, wife is present.  [name] report she served him tomato soup yesterday and had brought peach cobbler for him.  Today his voice is largely clear - He is consuming thickened water but wife reports he does not like the Lemon Lime anymore as he "got used to it".  Oral cavity posterior on left appears dark today - hopefully indicating ABX working for his tonsillitis.  Observed pt with intake of thickened water via straw - delayed cough x1/2 boluses.  Pt was not sitting fully upright however and therefore SLP repostioned him.  Thin Gingerale provided via tsp with good tolerance, therefore SlP obtained a Provale Cup (bolus flow control ) for the pt to use and demonstrated it to his family *wife and son present.  When extends head upward, he will cough even using Provale cup, therefore informed pt and family of need for him to maintain a head neutral position and stay upright as much as able.  Note palliative care has seen pt/family and plan to follow up.  Recommend continue diet with strict aspiration precautions.  Pt's CXR remains stable and WBC has decreased from 17 to 15.  Documented intake has been poor and concerns for adequate nutrition with aspiration mitigation remains.  Advised wife to continue to bring in small amounts of soft snacks he may may enjoy when she comes to see him but dys1/nectar - tsps thin from hospital advised.  Pt only consumed approx 7 Provale cup boluses of gingerale and 3 sips of nectar thickened water before he advised he wanted to rest.  He is echolalic frequently and is very pleasant and cooperative.  Education and support ongoing with pt and his wife.   Anticipate pt will benefit from follow up SLP at [facility] for dysphagia management.   HPI HPI: Pt is an 86 y.o. male with medical history significant for hyperlipidemia, CAD, A. fib on warfarin, GERD, CKD stage III, Parkinson's disease who presented to the ED via EMS after sustaining a fall at home. CXR 11/23: Worsening airspace disease in the left lower lung, suspect for pneumonia or potentially aspiration. Cardiomegaly. Pt was most recently seen by SLP on 03/17/13 and a dysphagia 2 diet with thin liquids was recommended at that time per pt's request d/t thrush. MBS 02/26/13: Pt. exhibited mild oral dysphagia with delayed transit exacerbated by xerostomia and decreased endurance.  Mild-mod sensory pharyngeal dysphagia with reduced tongue base retraction and laryngeal elevation resulting in trace vallecular/pyriform sinus/pharyngeal wall residue.    Concerns for aspiration pna present.      SLP Plan  Continue with current plan of care       Recommendations  Diet recommendations: Dysphagia 1 (puree);Nectar-thick liquid (tsps thin ok) Liquids provided via: Cup;Straw;Teaspoon Medication Administration: Crushed with puree Supervision: Full supervision/cueing for compensatory strategies Compensations: Slow rate;Small sips/bites;Other (Comment) (fully upright and alert, assure swallows before giving more) Postural Changes and/or Swallow Maneuvers: Seated upright 90 degrees;Upright 30-60 min after meal (cue pt to cough strongly and expectorate if reflexively coughs)                Oral Care Recommendations: Other (Comment) (oral care TID, wife to examine oral cavity daily) SLP Visit Diagnosis: Dysphagia, oropharyngeal phase (R13.12);Dysphagia, pharyngoesophageal  phase (R13.14) Plan: Continue with current plan of care       GO               Kathleen Lime, MS [place] Office [phone] Pager [phone]   Macario Golds 01/22/2020, 6:42 PM

## 2020-01-22 NOTE — Plan of Care (Addendum)
  Problem: Education: Goal: Knowledge of General Education information will improve Description: Including pain rating scale, medication(s)/side effects and non-pharmacologic comfort measures Outcome: Progressing   Problem: Clinical Measurements: Goal: Will remain free from infection Outcome: Progressing Goal: Respiratory complications will improve Outcome: Progressing   Problem: Nutrition: Goal: Adequate nutrition will be maintained Outcome: Progressing   Problem: Coping: Goal: Level of anxiety will decrease Outcome: Progressing   Problem: Elimination: Goal: Will not experience complications related to urinary retention Outcome: Progressing   Problem: Safety: Goal: Ability to remain free from injury will improve Outcome: Progressing   Problem: Skin Integrity: Goal: Risk for impaired skin integrity will decrease Outcome: Progressing   Problem: Clinical Measurements: Goal: Postoperative complications will be avoided or minimized Outcome: Progressing

## 2020-01-22 NOTE — Progress Notes (Signed)
Pt wife ask to speak to charge RN. Writer spoke with pts wife at bedside and patients son via phone on speaker. Family voiced concerns about lack of imagining after assisted fall on 12/2. This RN notified MD Adhikari. Patients primary nurse notified.

## 2020-01-22 NOTE — Progress Notes (Signed)
MD made aware of change of weight from 66.6 kg to 63.8 kg in two days.

## 2020-01-23 LAB — CBC WITH DIFFERENTIAL/PLATELET
Abs Immature Granulocytes: 0.14 10*3/uL — ABNORMAL HIGH (ref 0.00–0.07)
Basophils Absolute: 0.1 10*3/uL (ref 0.0–0.1)
Basophils Relative: 1 %
Eosinophils Absolute: 0.4 10*3/uL (ref 0.0–0.5)
Eosinophils Relative: 3 %
HCT: 25.1 % — ABNORMAL LOW (ref 39.0–52.0)
Hemoglobin: 8 g/dL — ABNORMAL LOW (ref 13.0–17.0)
Immature Granulocytes: 1 %
Lymphocytes Relative: 7 %
Lymphs Abs: 1 10*3/uL (ref 0.7–4.0)
MCH: 29.2 pg (ref 26.0–34.0)
MCHC: 31.9 g/dL (ref 30.0–36.0)
MCV: 91.6 fL (ref 80.0–100.0)
Monocytes Absolute: 1.2 10*3/uL — ABNORMAL HIGH (ref 0.1–1.0)
Monocytes Relative: 9 %
Neutro Abs: 11.5 10*3/uL — ABNORMAL HIGH (ref 1.7–7.7)
Neutrophils Relative %: 79 %
Platelets: 371 10*3/uL (ref 150–400)
RBC: 2.74 MIL/uL — ABNORMAL LOW (ref 4.22–5.81)
RDW: 15 % (ref 11.5–15.5)
WBC: 14.4 10*3/uL — ABNORMAL HIGH (ref 4.0–10.5)
nRBC: 0 % (ref 0.0–0.2)

## 2020-01-23 LAB — BASIC METABOLIC PANEL
Anion gap: 13 (ref 5–15)
BUN: 31 mg/dL — ABNORMAL HIGH (ref 8–23)
CO2: 20 mmol/L — ABNORMAL LOW (ref 22–32)
Calcium: 8.1 mg/dL — ABNORMAL LOW (ref 8.9–10.3)
Chloride: 109 mmol/L (ref 98–111)
Creatinine, Ser: 1.43 mg/dL — ABNORMAL HIGH (ref 0.61–1.24)
GFR, Estimated: 47 mL/min — ABNORMAL LOW (ref >60)
Glucose, Bld: 133 mg/dL — ABNORMAL HIGH (ref 70–99)
Potassium: 3.4 mmol/L — ABNORMAL LOW (ref 3.5–5.1)
Sodium: 142 mmol/L (ref 135–145)

## 2020-01-23 LAB — GLUCOSE, CAPILLARY
Glucose-Capillary: 127 mg/dL — ABNORMAL HIGH (ref 70–99)
Glucose-Capillary: 97 mg/dL (ref 70–99)
Glucose-Capillary: 128 mg/dL — ABNORMAL HIGH (ref 70–99)
Glucose-Capillary: 130 mg/dL — ABNORMAL HIGH (ref 70–99)

## 2020-01-23 MED ORDER — POTASSIUM CHLORIDE 20 MEQ PO PACK
40.0000 meq | PACK | Freq: Two times a day (BID) | ORAL | Status: DC
  Administered 2020-01-23 (×2): 40 meq via ORAL
  Filled 2020-01-23 (×3): qty 2

## 2020-01-23 MED ORDER — TRAZODONE HCL 50 MG PO TABS
50.0000 mg | ORAL_TABLET | Freq: Once | ORAL | Status: AC
  Administered 2020-01-23: 50 mg via ORAL
  Filled 2020-01-23: qty 1

## 2020-01-23 MED ORDER — SODIUM CHLORIDE 0.9 % IV SOLN
INTRAVENOUS | Status: DC | PRN
  Administered 2020-01-23: 250 mL via INTRAVENOUS

## 2020-01-23 NOTE — Plan of Care (Signed)
  Problem: Education: Goal: Knowledge of General Education information will improve Description: Including pain rating scale, medication(s)/side effects and non-pharmacologic comfort measures Outcome: Progressing   Problem: Clinical Measurements: Goal: Will remain free from infection Outcome: Progressing Goal: Diagnostic test results will improve Outcome: Progressing Goal: Respiratory complications will improve Outcome: Progressing   Problem: Coping: Goal: Level of anxiety will decrease Outcome: Progressing   Problem: Elimination: Goal: Will not experience complications related to urinary retention Outcome: Progressing   Problem: Pain Managment: Goal: General experience of comfort will improve Outcome: Progressing   Problem: Safety: Goal: Ability to remain free from injury will improve Outcome: Progressing   Problem: Skin Integrity: Goal: Risk for impaired skin integrity will decrease Outcome: Progressing   Problem: Pain Management: Goal: Pain level will decrease Outcome: Progressing

## 2020-01-23 NOTE — Progress Notes (Signed)
Notified MD on call about change of weight since yesterday.

## 2020-01-23 NOTE — Progress Notes (Signed)
Patient wife is very concerned about the patient's operative leg and his pain secondary to assisted fall that occurred on Thursday 12/2. Requested nursing staff to contact orthopedic surgeon in regards to above concerns. Made on-call P.Loni Muse of [place], MD aware. On call P.A. stated one of the practitioners would come and see the patient and address concerns. Oncoming nurse and patient's wife was made aware.

## 2020-01-23 NOTE — TOC Progression Note (Addendum)
Transition of Care [facility]) - Progression Note    Patient Details  Name: Robert Richard MRN: [id number] Date of Birth: 1930-06-05  [facility] of [facility]) CM/SW Contact  Ross Ludwig, [place] Phone Number: 01/23/2020, 4:43 PM  Clinical Narrative:     CSW contacted patient's insurance company to start [place], reference number is [id number].  Patient's wife to decide on SNF placement after reviewing bed choices and pending insurance authorization.  CSW provided bed offers to patient's wife for her to review and discuss with their son to help make a decision.  Per patient's wife he has not been vaccinated for Covid.  Weekend TOC to follow up with wife about bed choice.  Expected Discharge Plan: Skilled Nursing Facility Barriers to Discharge: Ship broker, Continued Medical Work up  Expected Discharge Plan and Services Expected Discharge Plan: [place] Choice: [place] arrangements for the past 2 months: Single Family Home                                       Social Determinants of Health (SDOH) Interventions    Readmission Risk Interventions No flowsheet data found.

## 2020-01-23 NOTE — Progress Notes (Signed)
Subjective: 13 Days Post-Op Procedure(s) (LRB): INTRAMEDULLARY (IM) NAIL INTERTROCHANTRIC (Right)  Patient reports pain as mild to moderate.  Patient resting comfortably in bed this morning. Patient's wife at bedside.  She reports that he did not sleep much last night, which she attributes to pain. Patient sustained mechanical fall with assistance to the floor on 01/21/20.  New plain films of the Right hip and knee were obtained.  Patient's wife is concerned about is operative hip since the fall.  Objective:   VITALS:  Temp:  [98.1 F (36.7 C)-98.4 F (36.9 C)] 98.1 F (36.7 C) (12/04 0521) Pulse Rate:  [69-92] 69 (12/04 0521) Resp:  [16-22] 20 (12/04 0521) BP: (128-140)/(62-78) 134/62 (12/04 0521) SpO2:  [90 %-93 %] 92 % (12/04 0537) Weight:  [62.1 kg] 62.1 kg (12/04 0528)  General: WDWN patient in NAD. Psych:  Appropriate mood and affect. Neuro:  A&O x 3, Moving all extremities, sensation intact to light touch HEENT:  EOMs intact Chest:  Even non-labored respirations Skin:  Dressing C/D/I, no rashes or lesions Extremities: warm/dry, no edema, erythema or echymosis.  No lymphadenopathy. Pulses: Popliteus 2+ MSK:  Non-tender to palpation of R hip, femur, and knee. ROM: TKE, MMT: able to perform quad set, (-) Homan's    LABS Recent Labs    01/21/20 0432 01/21/20 0432 01/22/20 0422 01/23/20 0613  HGB 7.8*  --  7.6* 8.0*  WBC 17.0*   < > 15.1* 14.4*  PLT 322   < > 333 371   < > = values in this interval not displayed.   Recent Labs    01/22/20 0422 01/23/20 0613  NA 142 142  K 3.8 3.4*  CL 111 109  CO2 19* 20*  BUN 30* 31*  CREATININE 1.34* 1.43*  GLUCOSE 124* 133*   Recent Labs    01/21/20 0432 01/22/20 0422  INR 2.2* 2.1*     Assessment/Plan: 13 Days Post-Op Procedure(s) (LRB): INTRAMEDULLARY (IM) NAIL INTERTROCHANTRIC (Right)  Patient seen in rounds for Dr. [name]. Plain films of Bilateral hip and knee that were obtained yesterday were reviewed.  I  reassured the patient and his wife that the x-rays demonstrated appropriate alignment of the implants and that there is no evidence of new fracture, lesion, or dislocation.  Patient's increase soreness is expected after a fall, which we anticipate will spontaneously return to baseline. WBAT R LE Up with therapy Continue with original plan to follow up with Dr. [name] in outpatient setting.   Mechele Claude PA-C [facility] Office:  [phone]

## 2020-01-23 NOTE — Progress Notes (Signed)
PROGRESS NOTE    Robert Richard  SAY:[id number] DOB: Aug 12, 1930 DOA: 12/30/2019 PCP: [name], MD   Chief Complain: Fall  Brief Narrative: Patient is 86 year old male with history of coronary disease, hyperlipidemia, A. fib on warfarin, GERD, CKD stage III, Parkinson's disease, dementia who presented to the emergency department after falling at home.  Patient also sustained facial laceration with a scalp contusion after fall.  He was complaining of right hip pain.  X-ray of the hip on presentation showed right hip fracture.  He was seen by orthopedics and he underwent intramedullary fixation of the right femur .  Hospital course also remarkable for aspiration pneumonia, UTI from Pseudomonas and another fall on 01/21/2020.  Patient is currently waiting for discharge to skilled nursing facility.  Assessment & Plan:   Principal Problem:   Closed right hip fracture, initial encounter ([place]) Active Problems:   Hyperlipidemia   CKD (chronic kidney disease)   CAD (coronary artery disease)   Atrial fibrillation with RVR (HCC)   Diabetes mellitus without complication (HCC)   GERD (gastroesophageal reflux disease)   Parkinson's disease ([place])   Facial laceration   Leukocytosis   Hyperglycemia   Fall at home, initial encounter   Acute comminuted closed right hip fracture/fall: Seen by orthopedics and he underwent intramedullary fixation of the right femur on 11/21.  Hospital course complicated by anemia.  He will follow-up with orthopedics as an outpatient. Patient seen by PT/OT and recommended skilled nursing facility.  Fall: There was report of fall on 01/21/2020.  As per the request of wife, I have ordered x-rays of the pelvis and knees,which are negative.  There is no obvious dislocation or fracture suspected on physical examination  Aspiration pneumonia: Currently on room air.  Hospital course remarkable for aspiration.  Imagings done during this hospitalization showed multifocal  pneumonia.  Respiratory status has been improving.  Currently on Zosyn,will finish Abx course today.  He was also on Lasix for suspicion of volume overload.  Pseudomonas UTI: Currently on Zosyn.  Chronic A. fib: Hospital course remarkable for RVR.  Currently rate is controlled.  He required IV digoxin, IV Cardizem during this hospitalization.  Currently on oral Cardizem.  He is on Coumadin at home for anticoagulation which is on hold due to anemia.  Will discuss with family about the risks of being on warfarin at this time , high risk for fall.  The best decision would be to hold the warfarin indefinitely.  Normocytic anemia: Could be associated with blood loss from surgery.  Currently hemoglobin in the range of 8.  He was transfused with 4 units of PRBC during this hospitalization.  Continue to monitor CBC.  CKD stage III: Currently kidney function at baseline.  Continue to monitor intermittently  Coronary artery disease/hyperlipidemia: Currently stable.  Continue Crestor  Acute metabolic encephalopathy/dementia/Parkinson's disease: On Sinemet.  Frequently confused at baseline.  Continue supportive care.  Delirium precautions.  Currently mental status at baseline.  Diabetes type 2: Hemoglobin A1c of 6.5 as per 6/21.  Continue to monitor blood sugars.  Goals of care: Very elderly patient with dementia, Parkinson's disease and other multiple comorbidities. Palliative care was involved during this hospitalization. Currently he is DNR.  He will follow-up with palliative care as an outpatient.           DVT prophylaxis: warfarin Code Status: DNR Family Communication: Wife at bedside on 01/22/20 Status is: Inpatient  Remains inpatient appropriate because:Inpatient level of care appropriate due to severity of illness  Dispo: The patient is from: Home              Anticipated d/c is to: SNF              Anticipated d/c date is: tomorrow         Patient currently is medically stable for  discharge.     Consultants: Cardiology, orthopedics  Procedures: ORIF  Antimicrobials:  Anti-infectives (From admission, onward)   Start     Dose/Rate Route Frequency Ordered Stop   01/21/20 1000  piperacillin-tazobactam (ZOSYN) IVPB 3.375 g        3.375 g 12.5 mL/hr over 240 Minutes Intravenous Every 8 hours 01/21/20 0751 01/24/20 0959   01/14/20 0830  piperacillin-tazobactam (ZOSYN) IVPB 3.375 g        3.375 g 12.5 mL/hr over 240 Minutes Intravenous Every 8 hours 01/14/20 0806 01/20/20 2359   01/12/20 2100  vancomycin (VANCOCIN) IVPB 1000 mg/200 mL premix        1,000 mg 200 mL/hr over 60 Minutes Intravenous  Once 01/12/20 2001 01/13/20 0548   01/12/20 2030  cefTRIAXone (ROCEPHIN) 1 g in sodium chloride 0.9 % 100 mL IVPB  Status:  Discontinued        1 g 200 mL/hr over 30 Minutes Intravenous Every 24 hours 01/12/20 1930 01/14/20 0756   12/24/2019 1500  ceFAZolin (ANCEF) IVPB 2g/100 mL premix        2 g 200 mL/hr over 30 Minutes Intravenous Every 6 hours 12/21/2019 1113 01/04/2020 2141      Subjective:  Patient seen and examined at the bedside this morning.  Hemodynamically stable.  Comfortable.  Alert and awake but confused.  Not in any kind of distress  Objective: Vitals:   01/23/20 0036 01/23/20 0521 01/23/20 0528 01/23/20 0537  BP: 140/78 134/62    Pulse:  69    Resp:  20    Temp:  98.1 F (36.7 C)    TempSrc:  Oral    SpO2:  92%  92%  Weight:   62.1 kg   Height:   6\' 2"  (1.88 m)     Intake/Output Summary (Last 24 hours) at 01/23/2020 8280 Last data filed at 01/23/2020 0349 Gross per 24 hour  Intake 464.52 ml  Output 750 ml  Net -285.48 ml   Filed Weights   01/21/20 0627 01/22/20 0520 01/23/20 0528  Weight: 65.2 kg 63.8 kg 62.1 kg    Examination:   General exam: Very deconditioned, debilitated elderly male, cachectic Respiratory system: Bilateral diminished air sounds on the bases cardiovascular system : Irregularly regular rhythm. No JVD, murmurs, rubs,  gallops or clicks. Gastrointestinal system: Abdomen is nondistended, soft and nontender. No organomegaly or masses felt. Normal bowel sounds heard. Central nervous system: Alert and awake but not oriented. No focal neurological deficits. Extremities: No edema, no clubbing ,no cyanosis, clean surgical wound on the right hip Skin: Scattered bruises    Data Reviewed: I have personally reviewed following labs and imaging studies  CBC: Recent Labs  Lab 01/18/20 0419 01/18/20 0419 01/19/20 0514 01/19/20 0514 01/19/20 1808 01/20/20 0456 01/21/20 0432 01/22/20 0422 01/23/20 0613  WBC 15.4*   < > 16.3*  --   --  17.9* 17.0* 15.1* 14.4*  NEUTROABS 12.0*  --  12.4*  --   --  14.2*  --  11.9* 11.5*  HGB 7.4*   < > 6.4*   < > 8.3* 7.7* 7.8* 7.6* 8.0*  HCT 23.2*   < > 20.0*   < >  25.1* 23.7* 24.4* 23.2* 25.1*  MCV 92.1   < > 91.7  --   --  90.8 92.8 89.6 91.6  PLT 251   < > 258  --   --  288 322 333 371   < > = values in this interval not displayed.   Basic Metabolic Panel: Recent Labs  Lab 01/18/20 0419 01/18/20 0419 01/19/20 0514 01/20/20 0456 01/20/20 1942 01/21/20 0432 01/22/20 0422 01/23/20 0613  NA 140   < > 140 141  --  142 142 142  K 3.8   < > 3.7 3.5  --  3.4* 3.8 3.4*  CL 111   < > 111 111  --  112* 111 109  CO2 18*   < > 19* 20*  --  21* 19* 20*  GLUCOSE 151*   < > 148* 135*  --  143* 124* 133*  BUN 29*   < > 34* 34*  --  33* 30* 31*  CREATININE 1.41*   < > 1.42* 1.37*  --  1.00 1.34* 1.43*  CALCIUM 7.8*   < > 7.8* 8.0*  --  8.0* 8.1* 8.1*  MG 2.2  --  2.3 2.3 2.3  --  2.3  --   PHOS 2.7  --  2.7 3.0 2.6 2.8  --   --    < > = values in this interval not displayed.   GFR: Estimated Creatinine Clearance: 32 mL/min (A) (by C-G formula based on SCr of 1.43 mg/dL (H)). Liver Function Tests: Recent Labs  Lab 01/18/20 0419 01/19/20 0514 01/20/20 0456 01/21/20 0432 01/22/20 0422  AST 52* 52* 52* 47* 38  ALT 21 27 25 30 11   ALKPHOS 140* 146* 168* 192* 165*    BILITOT 1.7* 1.6* 2.6* 2.0* 2.0*  PROT 6.2* 6.1* 6.5 6.7 6.5  ALBUMIN 2.4* 2.4* 2.4* 2.4* 2.2*   No results for input(s): LIPASE, AMYLASE in the last 168 hours. No results for input(s): AMMONIA in the last 168 hours. Coagulation Profile: Recent Labs  Lab 01/18/20 0419 01/19/20 0514 01/20/20 0456 01/21/20 0432 01/22/20 0422  INR 2.6* 2.1* 2.4* 2.2* 2.1*   Cardiac Enzymes: No results for input(s): CKTOTAL, CKMB, CKMBINDEX, TROPONINI in the last 168 hours. BNP (last 3 results) No results for input(s): PROBNP in the last 8760 hours. HbA1C: No results for input(s): HGBA1C in the last 72 hours. CBG: Recent Labs  Lab 01/21/20 2157 01/22/20 1138 01/22/20 1618 01/22/20 2054 01/23/20 0737  GLUCAP 119* 132* 128* 145* 127*   Lipid Profile: No results for input(s): CHOL, HDL, LDLCALC, TRIG, CHOLHDL, LDLDIRECT in the last 72 hours. Thyroid Function Tests: No results for input(s): TSH, T4TOTAL, FREET4, T3FREE, THYROIDAB in the last 72 hours. Anemia Panel: No results for input(s): VITAMINB12, FOLATE, FERRITIN, TIBC, IRON, RETICCTPCT in the last 72 hours. Sepsis Labs: No results for input(s): PROCALCITON, LATICACIDVEN in the last 168 hours.  No results found for this or any previous visit (from the past 240 hour(s)).       Radiology Studies: DG Chest 1 View  Result Date: 01/21/2020 CLINICAL DATA:  Dyspnea on exertion EXAM: CHEST  1 VIEW COMPARISON:  01/19/2020 chest radiograph. FINDINGS: Intact sternotomy wires. Aortic valve prosthesis in place. Stable cardiomediastinal silhouette with mild cardiomegaly. No pneumothorax. Trace right pleural effusion, similar. No significant left pleural effusion. Patchy diffuse bilateral lung opacities, most prominent in the upper lungs, stable to slightly improved. IMPRESSION: 1. Stable to slightly improved patchy diffuse bilateral lung opacities, most prominent in the  upper lungs, favoring multilobar pneumonia. 2. Stable trace right pleural  effusion. 3. Stable mild cardiomegaly. Electronically Signed   By: Ilona Sorrel M.D.   On: 01/21/2020 17:09   DG Knee 1-2 Views Left  Result Date: 01/22/2020 CLINICAL DATA:  Knee pain and stiffness. EXAM: LEFT KNEE - 1-2 VIEW COMPARISON:  None. FINDINGS: Normal alignment no fracture. Joint spaces normal. No effusion. Arterial calcification. IMPRESSION: Normal left knee Electronically Signed   By: Franchot Gallo M.D.   On: 01/22/2020 12:52   DG Knee 1-2 Views Right  Result Date: 01/22/2020 CLINICAL DATA:  Bilateral hip and knee pain. EXAM: RIGHT KNEE - 1-2 VIEW COMPARISON:  None. FINDINGS: Normal alignment no fracture. Joint spaces intact. Arterial calcification. Surgical clips in the medial soft tissues. IMPRESSION: No acute abnormality.  Normal knee joint. Electronically Signed   By: Franchot Gallo M.D.   On: 01/22/2020 12:51   DG HIPS BILAT WITH PELVIS 2V  Result Date: 01/22/2020 CLINICAL DATA:  Bilateral hip and knee pain. Recent right hip surgery. EXAM: DG HIP (WITH OR WITHOUT PELVIS) 2V BILAT COMPARISON:  12/23/2019 FINDINGS: Surgical fixation of right intertrochanteric fracture with satisfactory alignment. No new fracture.  Normal left hip joint.  No pelvic bone lesion. IMPRESSION: ORIF right intertrochanteric fracture.  No new finding. Electronically Signed   By: Franchot Gallo M.D.   On: 01/22/2020 12:51        Scheduled Meds: . carbidopa-levodopa  1 tablet Oral TID  . diltiazem  60 mg Oral Q6H  . docusate  100 mg Oral BID  . feeding supplement  237 mL Oral BID BM  . ferrous sulfate  325 mg Oral Q breakfast  . insulin aspart  0-9 Units Subcutaneous TID WC  . ketotifen  1 drop Both Eyes Daily  . lidocaine   Inhalation QHS  . multivitamin with minerals   Oral Daily  . nystatin  5 mL Oral QID  . pantoprazole sodium  40 mg Oral BID  . potassium chloride  40 mEq Oral BID  . rosuvastatin  2.5 mg Oral QODAY  . sodium bicarbonate  650 mg Oral BID   Continuous Infusions: .  piperacillin-tazobactam (ZOSYN)  IV 3.375 g (01/23/20 0242)     LOS: 14 days    Time spent:15 mins, More than 50% of that time was spent in counseling and/or coordination of care.      Shelly Coss, MD [facility] P12/05/2019, 8:12 AM

## 2020-01-24 ENCOUNTER — Inpatient Hospital Stay (HOSPITAL_COMMUNITY): Payer: Medicare Other

## 2020-01-24 LAB — CBC WITH DIFFERENTIAL/PLATELET
Abs Immature Granulocytes: 0.16 10*3/uL — ABNORMAL HIGH (ref 0.00–0.07)
Basophils Absolute: 0.1 10*3/uL (ref 0.0–0.1)
Basophils Relative: 1 %
Eosinophils Absolute: 0 10*3/uL (ref 0.0–0.5)
Eosinophils Relative: 0 %
HCT: 28.4 % — ABNORMAL LOW (ref 39.0–52.0)
Hemoglobin: 8.3 g/dL — ABNORMAL LOW (ref 13.0–17.0)
Immature Granulocytes: 1 %
Lymphocytes Relative: 5 %
Lymphs Abs: 1 10*3/uL (ref 0.7–4.0)
MCH: 29.1 pg (ref 26.0–34.0)
MCHC: 29.2 g/dL — ABNORMAL LOW (ref 30.0–36.0)
MCV: 99.6 fL (ref 80.0–100.0)
Monocytes Absolute: 1.4 10*3/uL — ABNORMAL HIGH (ref 0.1–1.0)
Monocytes Relative: 7 %
Neutro Abs: 16.1 10*3/uL — ABNORMAL HIGH (ref 1.7–7.7)
Neutrophils Relative %: 86 %
Platelets: 402 10*3/uL — ABNORMAL HIGH (ref 150–400)
RBC: 2.85 MIL/uL — ABNORMAL LOW (ref 4.22–5.81)
RDW: 15.3 % (ref 11.5–15.5)
WBC: 18.8 10*3/uL — ABNORMAL HIGH (ref 4.0–10.5)
nRBC: 0 % (ref 0.0–0.2)

## 2020-01-24 LAB — BASIC METABOLIC PANEL
Anion gap: 15 (ref 5–15)
BUN: 32 mg/dL — ABNORMAL HIGH (ref 8–23)
CO2: 16 mmol/L — ABNORMAL LOW (ref 22–32)
Calcium: 8.3 mg/dL — ABNORMAL LOW (ref 8.9–10.3)
Chloride: 115 mmol/L — ABNORMAL HIGH (ref 98–111)
Creatinine, Ser: 1.43 mg/dL — ABNORMAL HIGH (ref 0.61–1.24)
GFR, Estimated: 47 mL/min — ABNORMAL LOW (ref >60)
Glucose, Bld: 138 mg/dL — ABNORMAL HIGH (ref 70–99)
Potassium: 5 mmol/L (ref 3.5–5.1)
Sodium: 146 mmol/L — ABNORMAL HIGH (ref 135–145)

## 2020-01-24 LAB — GLUCOSE, CAPILLARY
Glucose-Capillary: 143 mg/dL — ABNORMAL HIGH (ref 70–99)
Glucose-Capillary: 108 mg/dL — ABNORMAL HIGH (ref 70–99)
Glucose-Capillary: 121 mg/dL — ABNORMAL HIGH (ref 70–99)
Glucose-Capillary: 117 mg/dL — ABNORMAL HIGH (ref 70–99)

## 2020-01-24 MED ORDER — ALPRAZOLAM 0.25 MG PO TABS
0.2500 mg | ORAL_TABLET | Freq: Once | ORAL | Status: AC
  Administered 2020-01-24: 0.25 mg via ORAL
  Filled 2020-01-24: qty 1

## 2020-01-24 MED ORDER — SODIUM CHLORIDE 0.9 % IV SOLN
3.0000 g | Freq: Four times a day (QID) | INTRAVENOUS | Status: DC
  Administered 2020-01-24 – 2020-01-25 (×4): 3 g via INTRAVENOUS
  Filled 2020-01-24: qty 3
  Filled 2020-01-24 (×2): qty 8
  Filled 2020-01-24: qty 3
  Filled 2020-01-24: qty 8

## 2020-01-24 MED ORDER — METOPROLOL TARTRATE 5 MG/5ML IV SOLN
5.0000 mg | Freq: Once | INTRAVENOUS | Status: AC
  Administered 2020-01-24: 5 mg via INTRAVENOUS
  Filled 2020-01-24: qty 5

## 2020-01-24 MED ORDER — METOPROLOL TARTRATE 5 MG/5ML IV SOLN
5.0000 mg | INTRAVENOUS | Status: AC | PRN
  Administered 2020-01-24 – 2020-01-25 (×2): 5 mg via INTRAVENOUS
  Filled 2020-01-24 (×2): qty 5

## 2020-01-24 MED ORDER — SODIUM CHLORIDE 0.9 % IV BOLUS
500.0000 mL | Freq: Once | INTRAVENOUS | Status: AC
  Administered 2020-01-24: 500 mL via INTRAVENOUS

## 2020-01-24 MED ORDER — METOPROLOL TARTRATE 5 MG/5ML IV SOLN
5.0000 mg | INTRAVENOUS | Status: AC | PRN
  Administered 2020-01-24 (×2): 5 mg via INTRAVENOUS
  Filled 2020-01-24 (×2): qty 5

## 2020-01-24 MED ORDER — GLYCOPYRROLATE 0.2 MG/ML IJ SOLN
0.1000 mg | Freq: Two times a day (BID) | INTRAMUSCULAR | Status: DC
  Administered 2020-01-24 – 2020-01-25 (×3): 0.1 mg via INTRAVENOUS
  Filled 2020-01-24 (×3): qty 1

## 2020-01-24 MED ORDER — FUROSEMIDE 10 MG/ML IJ SOLN
40.0000 mg | Freq: Once | INTRAMUSCULAR | Status: AC
  Administered 2020-01-24: 40 mg via INTRAVENOUS
  Filled 2020-01-24: qty 4

## 2020-01-24 MED ORDER — ACETAMINOPHEN 650 MG RE SUPP
650.0000 mg | Freq: Four times a day (QID) | RECTAL | Status: DC | PRN
  Filled 2020-01-24: qty 1

## 2020-01-24 MED ORDER — DILTIAZEM HCL-DEXTROSE 125-5 MG/125ML-% IV SOLN (PREMIX)
5.0000 mg/h | INTRAVENOUS | Status: DC
  Administered 2020-01-24: 10 mg/h via INTRAVENOUS
  Administered 2020-01-24: 5 mg/h via INTRAVENOUS
  Administered 2020-01-25: 12.5 mg/h via INTRAVENOUS
  Filled 2020-01-24 (×3): qty 125

## 2020-01-24 NOTE — Progress Notes (Signed)
Pharmacy Antibiotic Note  Robert Richard is a 86 y.o. male admitted on 01/12/2020 with aspiration PNA.  Pharmacy has been consulted for Unasyn dosing.  Plan: Unasyn 3 gr IV q6h  Monitor clinical course, renal function, cultures as available   Height: 6\' 2"  (188 cm) Weight: 62.1 kg (136 lb 14.5 oz) IBW/kg (Calculated) : 82.2  Temp (24hrs), Avg:99.6 F (37.6 C), Min:98.1 F (36.7 C), Max:100.9 F (38.3 C)  Recent Labs  Lab 01/20/20 0456 01/21/20 0432 01/22/20 0422 01/23/20 0613 01/24/20 0421  WBC 17.9* 17.0* 15.1* 14.4* 18.8*  CREATININE 1.37* 1.00 1.34* 1.43* 1.43*    Estimated Creatinine Clearance: 32 mL/min (A) (by C-G formula based on SCr of 1.43 mg/dL (H)).    Allergies  Allergen Reactions  . Iodinated Diagnostic Agents Hives    Other reaction(s): RASH Other reaction(s): RASH  . Ioxaglate Hives    Other reaction(s): RASH    Antimicrobials this admission:  11/23 vanc x1 11/23 CTX (PNA) >> 11/25 11/25 Zosyn >> 12/1  12/2>> 12/5  12/5 Unasyn >>    Microbiology results:  11/23 BCx x2: neg FINAL 11/24 UCx: 50k PsA (pans sens) FINAL  Thank you for allowing pharmacy to be a part of this patient's care.   Royetta Asal, PharmD, BCPS 01/24/2020 8:36 AM

## 2020-01-24 NOTE — Significant Event (Signed)
Rapid Response Event Note   Reason for Call : CN called at 2145 for increased heart rate 140-150's  Initial Focused Assessment: Patient Alert to person, able to follow simple commands.  Patient on Cardezim drip at 12.5.  Interventions: EKG obtained.  MD called for more beta blocker which helped earlier.  Plan of Care: Patient given lopressor 5mg  in 1/2 doses with time in between.  Will continue to monitor.    Event Summary:   MD Notified:  X. Blount NP Call Time: 2155 Arrival Time: 2150 End Time: 2234  Ethelene Hal, RN

## 2020-01-24 NOTE — Progress Notes (Signed)
Prior RN reported off to this RN about pt. labored breathing and stated that he only wear 2L o2 sometimes. Pt. O2 taken. O2 sat. 89%. O2 was increased to 5L to sustained 90-93. Throughout shift pt.HR increased to A-fib RVR (120's-150's). On-call provider aware. See new orders.

## 2020-01-24 NOTE — Plan of Care (Signed)
  Problem: Education: Goal: Knowledge of General Education information will improve Description Including pain rating scale, medication(s)/side effects and non-pharmacologic comfort measures Outcome: Progressing   

## 2020-01-24 NOTE — Progress Notes (Addendum)
   01/24/20 0408  Assess: MEWS Score  Temp (!) 100.7 F (38.2 C)  BP 127/78  Pulse Rate (!) 137  Resp (!) 38  Level of Consciousness Alert  SpO2 91 %  O2 Device Nasal Cannula  O2 Flow Rate (L/min) 5 L/min  Assess: MEWS Score  MEWS Temp 1  MEWS Systolic 0  MEWS Pulse 3  MEWS RR 3  MEWS LOC 0  MEWS Score 7  MEWS Score Color Red  Assess: if the MEWS score is Yellow or Red  Were vital signs taken at a resting state? Yes  Focused Assessment No change from prior assessment  Early Detection of Sepsis Score *See Row Information* Low  MEWS guidelines implemented *See Row Information* Yes  Treat  MEWS Interventions Administered prn meds/treatments  Take Vital Signs  Increase Vital Sign Frequency  Red: Q 1hr X 4 then Q 4hr X 4, if remains red, continue Q 4hrs  Escalate  MEWS: Escalate Red: discuss with charge nurse/RN and provider, consider discussing with RRT  Notify: Charge Nurse/RN  Name of Charge Nurse/RN Notified Hilda (hilda)  Date Charge Nurse/RN Notified 01/24/20  Time Charge Nurse/RN Notified 0500  Notify: Provider  Provider Name/Title blount  Date Provider Notified 01/24/20  Time Provider Notified 0430  Notification Type Page  Notification Reason Change in status  Response See new orders  Date of Provider Response 01/24/20  Time of Provider Response [phone]

## 2020-01-24 NOTE — Progress Notes (Signed)
Pt heart-rate 150's MD notified, Order received for Metoprolol 5 mg IV, administered will cont to monitor. SRP, RN

## 2020-01-24 NOTE — Progress Notes (Signed)
PROGRESS NOTE    Robert Richard  NOM:[id number] DOB: 1930-07-19 DOA: 01/02/2020 PCP: [name], MD   Chief Complain: Fall  Brief Narrative: Patient is 86 year old male with history of coronary disease, hyperlipidemia, A. fib on warfarin, GERD, CKD stage III, Parkinson's disease, dementia who presented to the emergency department after falling at home.  Patient also sustained facial laceration with a scalp contusion after fall.  He was complaining of right hip pain.  X-ray of the hip on presentation showed right hip fracture.  He was seen by orthopedics and he underwent intramedullary fixation of the right femur .  Hospital course also remarkable for aspiration pneumonia, UTI from Pseudomonas and another fall on 01/21/2020.  On the early morning of 01/24/2020, patient became hypoxic, tachypneic, went into RVR, lethargic.  X-ray showed diffuse bilateral opacities suggesting for new aspiration pneumonia.  Extensive discussion held with the family members, possible plan for residential hospice.  Assessment & Plan:   Principal Problem:   Closed right hip fracture, initial encounter ([place]) Active Problems:   Hyperlipidemia   CKD (chronic kidney disease)   CAD (coronary artery disease)   Atrial fibrillation with RVR (HCC)   Diabetes mellitus without complication (HCC)   GERD (gastroesophageal reflux disease)   Parkinson's disease ([place])   Facial laceration   Leukocytosis   Hyperglycemia   Fall at home, initial encounter   Aspiration pneumonia: Recurrent event.On the early morning of 01/24/2020, patient became hypoxic, tachypneic, went into RVR, lethargic.  X-ray showed diffuse bilateral opacities suggesting for new aspiration pneumonia.  Acute comminuted closed right hip fracture/fall: Seen by orthopedics and he underwent intramedullary fixation of the right femur on 11/21.  Hospital course complicated by anemia.  He will follow-up with orthopedics as an outpatient. Patient seen by PT/OT  and recommended skilled nursing facility.  Fall: There was report of fall on 01/21/2020.  No new  dislocation or fractures   Pseudomonas UTI: Completed antibiotics course  Chronic A. fib: Hospital course remarkable for RVR.  Currently rate is controlled.  He required IV digoxin, IV Cardizem during this hospitalization.    He is on Coumadin at home for anticoagulation which is on hold due to anemia.  Will discuss with family about the risks of being on warfarin at this time , high risk for fall.  The best decision would be to hold the warfarin indefinitely. Went into RVR again on 01/24/2020 and was started on Cardizem drip  Normocytic anemia: Could be associated with blood loss from surgery.  Currently hemoglobin in the range of 8.  He was transfused with 4 units of PRBC during this hospitalization.  Continue to monitor CBC.  CKD stage III: Currently kidney function at baseline.  Continue to monitor intermittently  Coronary artery disease/hyperlipidemia: Currently stable.  Continue Crestor  Acute metabolic encephalopathy/dementia/Parkinson's disease: On Sinemet.  Frequently confused at baseline.  Continue supportive care.  Delirium precautions.    Diabetes type 2: Hemoglobin A1c of 6.5 as per 6/21.  Continue to monitor blood sugars.  Goals of care: Very elderly patient with dementia, Parkinson's disease and other multiple comorbidities. Palliative care was involved during this hospitalization. Currently he is DNR.  Family wanted to continue aggressive treatment.  He again developed aspiration pneumonia on 12/5 so 21 and is currently lethargic.  Extensive discussion held with the family members, possible plan for residential hospice/comfort care         DVT prophylaxis: warfarin Code Status: DNR Family Communication: Wife at bedside on 01/24/20, son  on phone Status is: Inpatient  Remains inpatient appropriate because:Inpatient level of care appropriate due to severity of illness   Dispo:  The patient is from: Home              Anticipated d/c is to: Skilled nursing facility versus residential hospice                     Patient currently is not medically stable for discharge.     Consultants: Cardiology, orthopedics  Procedures: ORIF  Antimicrobials:  Anti-infectives (From admission, onward)   Start     Dose/Rate Route Frequency Ordered Stop   01/24/20 1200  Ampicillin-Sulbactam (UNASYN) 3 g in sodium chloride 0.9 % 100 mL IVPB        3 g 200 mL/hr over 30 Minutes Intravenous Every 6 hours 01/24/20 0833     01/21/20 1000  piperacillin-tazobactam (ZOSYN) IVPB 3.375 g        3.375 g 12.5 mL/hr over 240 Minutes Intravenous Every 8 hours 01/21/20 0751 01/24/20 0527   01/14/20 0830  piperacillin-tazobactam (ZOSYN) IVPB 3.375 g        3.375 g 12.5 mL/hr over 240 Minutes Intravenous Every 8 hours 01/14/20 0806 01/20/20 2359   01/12/20 2100  vancomycin (VANCOCIN) IVPB 1000 mg/200 mL premix        1,000 mg 200 mL/hr over 60 Minutes Intravenous  Once 01/12/20 2001 01/13/20 0548   01/12/20 2030  cefTRIAXone (ROCEPHIN) 1 g in sodium chloride 0.9 % 100 mL IVPB  Status:  Discontinued        1 g 200 mL/hr over 30 Minutes Intravenous Every 24 hours 01/12/20 1930 01/14/20 0756   01/17/2020 1500  ceFAZolin (ANCEF) IVPB 2g/100 mL premix        2 g 200 mL/hr over 30 Minutes Intravenous Every 6 hours 12/28/2019 1113 12/23/2019 2141      Subjective:  Patient seen and examined at the bedside this morning.  He became lethargic, had labored breathing, tachypneic and desaturated last night.  During my evaluation, he was lethargic, was unresponsive, was tachypneic.  Wife at the bedside.  Objective: Vitals:   01/24/20 0900 01/24/20 1000 01/24/20 1009 01/24/20 1012  BP: 129/84 131/75 126/75 107/81  Pulse: (!) 104 (!) 150 (!) 133 66  Resp: (!) 33 (!) 36 (!) 33 (!) 32  Temp: 98.4 F (36.9 C)  99.9 F (37.7 C)   TempSrc: Oral  Axillary   SpO2: (!) 89% 91% 92%   Weight:      Height:          Intake/Output Summary (Last 24 hours) at 01/24/2020 1107 Last data filed at 01/24/2020 7096 Gross per 24 hour  Intake 457.43 ml  Output 600 ml  Net -142.57 ml   Filed Weights   01/21/20 0627 01/22/20 0520 01/23/20 0528  Weight: 65.2 kg 63.8 kg 62.1 kg    Examination:   General exam: In severe respiratory distress, lethargic Respiratory system: Bilateral diminished air sounds, rhonchi, crackles cardiovascular system: A. fib with RVR  gastrointestinal system: Abdomen is nondistended, soft and nontender. No organomegaly or masses felt. Normal bowel sounds heard. Central nervous system: Not alert or awake Extremities: No edema, no clubbing ,no cyanosis, scattered bruises, surgical wound on the right hip     Data Reviewed: I have personally reviewed following labs and imaging studies  CBC: Recent Labs  Lab 01/19/20 0514 01/19/20 1808 01/20/20 0456 01/21/20 0432 01/22/20 0422 01/23/20 0613 01/24/20 0421  WBC 16.3*  --  17.9* 17.0* 15.1* 14.4* 18.8*  NEUTROABS 12.4*  --  14.2*  --  11.9* 11.5* 16.1*  HGB 6.4*   < > 7.7* 7.8* 7.6* 8.0* 8.3*  HCT 20.0*   < > 23.7* 24.4* 23.2* 25.1* 28.4*  MCV 91.7  --  90.8 92.8 89.6 91.6 99.6  PLT 258  --  288 322 333 371 402*   < > = values in this interval not displayed.   Basic Metabolic Panel: Recent Labs  Lab 01/18/20 0419 01/18/20 0419 01/19/20 0514 01/19/20 0514 01/20/20 0456 01/20/20 1942 01/21/20 0432 01/22/20 0422 01/23/20 0613 01/24/20 0421  NA 140   < > 140   < > 141  --  142 142 142 146*  K 3.8   < > 3.7   < > 3.5  --  3.4* 3.8 3.4* 5.0  CL 111   < > 111   < > 111  --  112* 111 109 115*  CO2 18*   < > 19*   < > 20*  --  21* 19* 20* 16*  GLUCOSE 151*   < > 148*   < > 135*  --  143* 124* 133* 138*  BUN 29*   < > 34*   < > 34*  --  33* 30* 31* 32*  CREATININE 1.41*   < > 1.42*   < > 1.37*  --  1.00 1.34* 1.43* 1.43*  CALCIUM 7.8*   < > 7.8*   < > 8.0*  --  8.0* 8.1* 8.1* 8.3*  MG 2.2  --  2.3  --  2.3 2.3  --  2.3   --   --   PHOS 2.7  --  2.7  --  3.0 2.6 2.8  --   --   --    < > = values in this interval not displayed.   GFR: Estimated Creatinine Clearance: 32 mL/min (A) (by C-G formula based on SCr of 1.43 mg/dL (H)). Liver Function Tests: Recent Labs  Lab 01/18/20 0419 01/19/20 0514 01/20/20 0456 01/21/20 0432 01/22/20 0422  AST 52* 52* 52* 47* 38  ALT 21 27 25 30 11   ALKPHOS 140* 146* 168* 192* 165*  BILITOT 1.7* 1.6* 2.6* 2.0* 2.0*  PROT 6.2* 6.1* 6.5 6.7 6.5  ALBUMIN 2.4* 2.4* 2.4* 2.4* 2.2*   No results for input(s): LIPASE, AMYLASE in the last 168 hours. No results for input(s): AMMONIA in the last 168 hours. Coagulation Profile: Recent Labs  Lab 01/18/20 0419 01/19/20 0514 01/20/20 0456 01/21/20 0432 01/22/20 0422  INR 2.6* 2.1* 2.4* 2.2* 2.1*   Cardiac Enzymes: No results for input(s): CKTOTAL, CKMB, CKMBINDEX, TROPONINI in the last 168 hours. BNP (last 3 results) No results for input(s): PROBNP in the last 8760 hours. HbA1C: No results for input(s): HGBA1C in the last 72 hours. CBG: Recent Labs  Lab 01/23/20 0737 01/23/20 1145 01/23/20 1603 01/23/20 2128 01/24/20 0746  GLUCAP 127* 97 128* 130* 143*   Lipid Profile: No results for input(s): CHOL, HDL, LDLCALC, TRIG, CHOLHDL, LDLDIRECT in the last 72 hours. Thyroid Function Tests: No results for input(s): TSH, T4TOTAL, FREET4, T3FREE, THYROIDAB in the last 72 hours. Anemia Panel: No results for input(s): VITAMINB12, FOLATE, FERRITIN, TIBC, IRON, RETICCTPCT in the last 72 hours. Sepsis Labs: No results for input(s): PROCALCITON, LATICACIDVEN in the last 168 hours.  No results found for this or any previous visit (from the past 240 hour(s)).       Radiology Studies: DG CHEST PORT  1 VIEW  Result Date: 01/24/2020 CLINICAL DATA:  Shortness of breath. EXAM: PORTABLE CHEST 1 VIEW COMPARISON:  January 21, 2020 FINDINGS: Bilateral pulmonary infiltrates have worsened in the interval, particularly in the upper  lobes. There is a layering right effusion with underlying opacity, not seen previously. The cardiomediastinal silhouette is unchanged. No pneumothorax. IMPRESSION: New right pleural effusion and worsening bilateral pulmonary infiltrates worrisome for pneumonia or aspiration. Electronically Signed   By: Dorise Bullion III M.D   On: 01/24/2020 10:17        Scheduled Meds: . carbidopa-levodopa  1 tablet Oral TID  . docusate  100 mg Oral BID  . feeding supplement  237 mL Oral BID BM  . ferrous sulfate  325 mg Oral Q breakfast  . insulin aspart  0-9 Units Subcutaneous TID WC  . ketotifen  1 drop Both Eyes Daily  . lidocaine   Inhalation QHS  . multivitamin with minerals   Oral Daily  . nystatin  5 mL Oral QID  . pantoprazole sodium  40 mg Oral BID  . potassium chloride  40 mEq Oral BID  . rosuvastatin  2.5 mg Oral QODAY  . sodium bicarbonate  650 mg Oral BID   Continuous Infusions: . sodium chloride Stopped (01/24/20 0127)  . ampicillin-sulbactam (UNASYN) IV    . diltiazem (CARDIZEM) infusion 5 mg/hr (01/24/20 0850)     LOS: 15 days    Time spent:35 mins, More than 50% of that time was spent in counseling and/or coordination of care.      Shelly Coss, MD [facility] P12/06/2019, 11:07 AM

## 2020-01-24 NOTE — Progress Notes (Signed)
Pt HR decreased to 90's A-fib after Metoprolol administered. BP 110/70. Family at bedside. Pt more alert mouthing word to wife. Weak cough noted. Pt too weak to cough up secretions. Noted to have phlegm upper throat area. Attempt to oral suction, pt pushed my hand back, did not tolerate suction. SRP, RN,

## 2020-01-24 NOTE — Progress Notes (Signed)
Pt has been restless at times during the shift with increased HR confirmed by 12-EKG Afib-flutter rate 110-150 non sustained. However, continue to be tachycardic arrhyhtmia. Spouse at bedside, son arrived. MD spoke with family on couple of occasions today to follow-up on pt conditions. O2 increased to 7 liters HFNC, to maintain sats 90% and greater. Cardizem started and final increased to 10 mg/min. Pt HR decreased to 90-100's with decreased respiration. Son currently at bedside with pt. Pt given Morphine 2 mg x 2 today and helped decreased discomfort and calm patient,. Will continue to monitor. SRP, RN

## 2020-01-25 LAB — CBC WITH DIFFERENTIAL/PLATELET
Abs Immature Granulocytes: 0.16 10*3/uL — ABNORMAL HIGH (ref 0.00–0.07)
Basophils Absolute: 0.1 10*3/uL (ref 0.0–0.1)
Basophils Relative: 1 %
Eosinophils Absolute: 0 10*3/uL (ref 0.0–0.5)
Eosinophils Relative: 0 %
HCT: 27.9 % — ABNORMAL LOW (ref 39.0–52.0)
Hemoglobin: 8.2 g/dL — ABNORMAL LOW (ref 13.0–17.0)
Immature Granulocytes: 1 %
Lymphocytes Relative: 11 %
Lymphs Abs: 1.6 10*3/uL (ref 0.7–4.0)
MCH: 28.8 pg (ref 26.0–34.0)
MCHC: 29.4 g/dL — ABNORMAL LOW (ref 30.0–36.0)
MCV: 97.9 fL (ref 80.0–100.0)
Monocytes Absolute: 1.2 10*3/uL — ABNORMAL HIGH (ref 0.1–1.0)
Monocytes Relative: 8 %
Neutro Abs: 12 10*3/uL — ABNORMAL HIGH (ref 1.7–7.7)
Neutrophils Relative %: 79 %
Platelets: 410 10*3/uL — ABNORMAL HIGH (ref 150–400)
RBC: 2.85 MIL/uL — ABNORMAL LOW (ref 4.22–5.81)
RDW: 15.2 % (ref 11.5–15.5)
WBC: 15.1 10*3/uL — ABNORMAL HIGH (ref 4.0–10.5)
nRBC: 0 % (ref 0.0–0.2)

## 2020-01-25 LAB — BASIC METABOLIC PANEL
Anion gap: 13 (ref 5–15)
BUN: 41 mg/dL — ABNORMAL HIGH (ref 8–23)
CO2: 19 mmol/L — ABNORMAL LOW (ref 22–32)
Calcium: 8.2 mg/dL — ABNORMAL LOW (ref 8.9–10.3)
Chloride: 117 mmol/L — ABNORMAL HIGH (ref 98–111)
Creatinine, Ser: 1.61 mg/dL — ABNORMAL HIGH (ref 0.61–1.24)
GFR, Estimated: 41 mL/min — ABNORMAL LOW (ref >60)
Glucose, Bld: 138 mg/dL — ABNORMAL HIGH (ref 70–99)
Potassium: 4.3 mmol/L (ref 3.5–5.1)
Sodium: 149 mmol/L — ABNORMAL HIGH (ref 135–145)

## 2020-01-25 LAB — GLUCOSE, CAPILLARY
Glucose-Capillary: 120 mg/dL — ABNORMAL HIGH (ref 70–99)
Glucose-Capillary: 147 mg/dL — ABNORMAL HIGH (ref 70–99)

## 2020-01-25 MED ORDER — MORPHINE SULFATE (PF) 2 MG/ML IV SOLN
1.0000 mg | INTRAVENOUS | Status: DC | PRN
  Administered 2020-01-25: 1 mg via INTRAVENOUS
  Filled 2020-01-25: qty 1

## 2020-01-25 MED ORDER — MORPHINE SULFATE (PF) 2 MG/ML IV SOLN
2.0000 mg | INTRAVENOUS | Status: DC | PRN
  Administered 2020-01-25: 2 mg via INTRAVENOUS
  Filled 2020-01-25: qty 1

## 2020-01-25 MED ORDER — SODIUM CHLORIDE 0.9 % IV SOLN
3.0000 g | Freq: Two times a day (BID) | INTRAVENOUS | Status: DC
  Filled 2020-01-25: qty 8

## 2020-01-25 MED ORDER — LORAZEPAM 2 MG/ML IJ SOLN
1.0000 mg | INTRAMUSCULAR | Status: DC | PRN
  Filled 2020-01-25: qty 1

## 2020-01-25 MED ORDER — MORPHINE 100MG IN NS 100ML (1MG/ML) PREMIX INFUSION
3.0000 mg/h | INTRAVENOUS | Status: DC
  Administered 2020-01-25: 1 mg/h via INTRAVENOUS
  Filled 2020-01-25: qty 100

## 2020-01-25 NOTE — Progress Notes (Signed)
Family finally agreed about patient getting morphine IV. Family requested Morphine drip. N.P. informed.

## 2020-01-25 NOTE — Progress Notes (Signed)
This note also relates to the following rows which could not be included: BP - Cannot attach notes to unvalidated device data Pulse Rate - Cannot attach notes to unvalidated device data ECG Heart Rate - Cannot attach notes to unvalidated device data Resp - Cannot attach notes to unvalidated device data SpO2 - Cannot attach notes to unvalidated device data    01/25/20 1400  Assess: MEWS Score  O2 Device HFNC  O2 Flow Rate (L/min) 12 L/min  Assess: if the MEWS score is Yellow or Red  Were vital signs taken at a resting state? Yes  Focused Assessment No change from prior assessment  Early Detection of Sepsis Score *See Row Information* High  MEWS guidelines implemented *See Row Information* No, previously red, continue vital signs every 4 hours  Treat  MEWS Interventions Administered scheduled meds/treatments  Escalate  MEWS: Escalate Red: discuss with charge nurse/RN and provider, consider discussing with RRT  Notify: Charge Nurse/RN  Name of Charge Nurse/RN Notified Janett Billow   Date Charge Nurse/RN Notified 01/25/20  Time Charge Nurse/RN Notified 1430  Notify: Provider  Provider Name/Title Adhikari   Date Provider Notified 01/25/20  Time Provider Notified 1438  Notification Type Page  Response See new orders;Other (Comment)  Date of Provider Response 01/25/20  Time of Provider Response 1435

## 2020-01-25 NOTE — Progress Notes (Signed)
RN noticed patient to be Labored breathing and uncomfortable. RN asked if they want morphine IV for the patient to make him comfortable. Wife refused at this time.

## 2020-01-25 NOTE — Progress Notes (Signed)
RN started morphine drip. Ativan IV given too. Will continue to monitor.

## 2020-01-25 NOTE — Discharge Summary (Signed)
Physician Discharge Summary  DEVINE KLINGEL NLG:[id number] DOB: 1930-10-19 DOA: 01/06/2020  PCP: [name], MD  Admit date: 12/27/2019 Discharge date: 01/25/2020  Admitted From: Home Disposition:  Home  Discharge Condition: Critical CODE STATUS:DNR    Brief/Interim Summary: Patient is 86 year old male with history of coronary disease, hyperlipidemia, A. fib on warfarin, GERD, CKD stage III, Parkinson's disease, dementia who presented to the emergency department after falling at home.  Patient also sustained facial laceration with a scalp contusion after fall.  He was complaining of right hip pain.  X-ray of the hip on presentation showed right hip fracture.  He was seen by orthopedics and he underwent intramedullary fixation of the right femur .  Hospital course also remarkable for aspiration pneumonia, UTI from Pseudomonas and another fall on 01/21/2020.  On the early morning of 01/24/2020, patient became hypoxic, tachypneic, went into RVR, lethargic.  X-ray showed diffuse bilateral opacities suggesting for new aspiration pneumonia.  After,extensive discussion  with the family members, family decided on residential hospice.  Following problems were addressed during his hospitalization:  Aspiration pneumonia: Recurrent event.On the early morning of 01/24/2020, patient became hypoxic, tachypneic, went into RVR, lethargic.  X-ray showed diffuse bilateral opacities suggesting for new aspiration pneumonia.  Acute comminuted closed right hip fracture/fall: Seen by orthopedics and he underwent intramedullary fixation of the right femur on 11/21.  Hospital course complicated by anemia.  Fall: There was report of fall on 01/21/2020.  No new  dislocation or fractures   Pseudomonas UTI: Completed antibiotics course  Chronic A. fib: Hospital course remarkable for RVR.  Currently rate is controlled.  He required IV digoxin, IV Cardizem during this hospitalization.    He was on Coumadin at home for  anticoagulation which is on hold due to anemia.    Normocytic anemia: Could be associated with blood loss from surgery.  Currently hemoglobin in the range of 8.  He was transfused with 4 units of PRBC during this hospitalization.    CKD stage III: Currently kidney function at baseline.   Coronary artery disease/hyperlipidemia:On Crestor  Acute metabolic encephalopathy/dementia/Parkinson's disease: On Sinemet.  Frequently confused at baseline.   Diabetes type 2: Hemoglobin A1c of 6.5 as per 6/21.   Goals of care: Very elderly patient with dementia, Parkinson's disease and other multiple comorbidities. Palliative care was involved during this hospitalization. Currently he is DNR.  Family wanted to continue aggressive treatment.  He again developed aspiration pneumonia on 12/5 so 21 and is currently lethargic.  Extensive discussion held with the family members, now the  plan is for residential hospice/comfort care       Discharge Diagnoses:  Principal Problem:   Closed right hip fracture, initial encounter [facility]) Active Problems:   Hyperlipidemia   CKD (chronic kidney disease)   CAD (coronary artery disease)   Atrial fibrillation with RVR ([place])   Diabetes mellitus without complication ([place])   GERD (gastroesophageal reflux disease)   Parkinson's disease ([place])   Facial laceration   Leukocytosis   Hyperglycemia   Fall at home, initial encounter    Discharge Instructions  Discharge Instructions    No wound care   Complete by: As directed      Allergies as of 01/25/2020      Reactions   Iodinated Diagnostic Agents Hives   Other reaction(s): RASH Other reaction(s): RASH   Ioxaglate Hives   Other reaction(s): RASH      Medication List    STOP taking these medications   busPIRone 15  MG tablet Commonly known as: BUSPAR   carbidopa-levodopa 25-100 MG tablet Commonly known as: SINEMET IR   Daily Multivitamin Caps   diltiazem 240 MG 24 hr capsule Commonly known  as: CARDIZEM CD   gabapentin 100 MG capsule Commonly known as: NEURONTIN   ketotifen 0.025 % ophthalmic solution Commonly known as: ZADITOR   nitroGLYCERIN 0.4 MG SL tablet Commonly known as: NITROSTAT   pantoprazole 20 MG tablet Commonly known as: PROTONIX   rosuvastatin 5 MG tablet Commonly known as: CRESTOR   topiramate 25 MG tablet Commonly known as: TOPAMAX     TAKE these medications   HYDROcodone-acetaminophen 5-325 MG tablet Commonly known as: NORCO/VICODIN Take 1 tablet by mouth every 4 (four) hours as needed for moderate pain.   warfarin 2 MG tablet Commonly known as: Jantoven Take as directed. If you are unsure how to take this medication, talk to your nurse or doctor. Original instructions: TAKE ONE (1) TABLET BY MOUTH EVERY DAY OR AS DIRECTED BY COUMADIN CLINIC What changed:   how much to take  how to take this  when to take this  additional instructions       Follow-up Information    Swinteck, Aaron Edelman, MD. Schedule an appointment as soon as possible for a visit in 2 weeks.   Specialty: Orthopedic Surgery Why: For suture removal, For wound re-check Contact information: [address] [place] [address] [phone]              Allergies  Allergen Reactions  . Iodinated Diagnostic Agents Hives    Other reaction(s): RASH Other reaction(s): RASH  . Ioxaglate Hives    Other reaction(s): RASH    Consultations:  Orthopedics, palliative care   Procedures/Studies: DG Chest 1 View  Result Date: 01/21/2020 CLINICAL DATA:  Dyspnea on exertion EXAM: CHEST  1 VIEW COMPARISON:  01/19/2020 chest radiograph. FINDINGS: Intact sternotomy wires. Aortic valve prosthesis in place. Stable cardiomediastinal silhouette with mild cardiomegaly. No pneumothorax. Trace right pleural effusion, similar. No significant left pleural effusion. Patchy diffuse bilateral lung opacities, most prominent in the upper lungs, stable to slightly improved.  IMPRESSION: 1. Stable to slightly improved patchy diffuse bilateral lung opacities, most prominent in the upper lungs, favoring multilobar pneumonia. 2. Stable trace right pleural effusion. 3. Stable mild cardiomegaly. Electronically Signed   By: Ilona Sorrel M.D.   On: 01/21/2020 17:09   DG Chest 1 View  Result Date: 12/30/2019 CLINICAL DATA:  Initial evaluation for preoperative evaluation, hip fracture. EXAM: CHEST  1 VIEW COMPARISON:  Prior radiograph from 10/15/2017 FINDINGS: Median sternotomy wires underlying CABG markers and valvular prosthesis noted. Mild exaggeration of the cardiac silhouette related AP technique. Mediastinal silhouette normal. Aortic atherosclerosis. Lungs mildly hypoinflated. Associated mild patchy left basilar opacity, likely atelectasis. No focal infiltrates. Mild perihilar vascular congestion without overt pulmonary edema. No pleural effusion. No pneumothorax. No acute osseous finding.  Osteopenia. IMPRESSION: 1. Hypoinflation with mild patchy left basilar opacity, likely atelectasis. 2. 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 3.  Aortic Atherosclerosis (ICD10-I70.0). Electronically Signed   By: Jeannine Boga M.D.   On: 01/03/2020 02:12   DG Knee 1-2 Views Left  Result Date: 01/22/2020 CLINICAL DATA:  Knee pain and stiffness. EXAM: LEFT KNEE - 1-2 VIEW COMPARISON:  None. FINDINGS: Normal alignment no fracture. Joint spaces normal. No effusion. Arterial calcification. IMPRESSION: Normal left knee Electronically Signed   By: Franchot Gallo M.D.   On: 01/22/2020 12:52   DG Knee 1-2 Views  Right  Result Date: 01/22/2020 CLINICAL DATA:  Bilateral hip and knee pain. EXAM: RIGHT KNEE - 1-2 VIEW COMPARISON:  None. FINDINGS: Normal alignment no fracture. Joint spaces intact. Arterial calcification. Surgical clips in the medial soft tissues. IMPRESSION: No acute abnormality.  Normal knee joint. Electronically Signed   By: Franchot Gallo M.D.   On: 01/22/2020  12:51   CT HEAD WO CONTRAST  Result Date: 01/13/2020 CLINICAL DATA:  Altered mental status. EXAM: CT HEAD WITHOUT CONTRAST TECHNIQUE: Contiguous axial images were obtained from the base of the skull through the vertex without intravenous contrast. COMPARISON:  January 09, 2020 FINDINGS: Brain: There is mild to moderate severity cerebral atrophy with widening of the extra-axial spaces and ventricular dilatation. There are areas of decreased attenuation within the white matter tracts of the supratentorial brain, consistent with microvascular disease changes. Vascular: No hyperdense vessel or unexpected calcification. Skull: Normal. Negative for fracture or focal lesion. Sinuses/Orbits: No acute finding. Other: Mild right frontal and right frontoparietal scalp soft tissue swelling is seen. IMPRESSION: 1. Mild right frontal and right frontoparietal scalp soft tissue swelling without evidence of an acute fracture or acute intracranial abnormality. 2. Cerebral atrophy and microvascular disease changes of the supratentorial brain. Electronically Signed   By: Virgina Norfolk M.D.   On: 01/13/2020 19:55   CT Head Wo Contrast  Result Date: 12/29/2019 CLINICAL DATA:  Fall, right cheek skin tear and right supraorbital hematoma EXAM: CT HEAD WITHOUT CONTRAST CT MAXILLOFACIAL WITHOUT CONTRAST CT CERVICAL SPINE WITHOUT CONTRAST TECHNIQUE: Multidetector CT imaging of the head, cervical spine, and maxillofacial structures were performed using the standard protocol without intravenous contrast. Multiplanar CT image reconstructions of the cervical spine and maxillofacial structures were also generated. COMPARISON:  CT head 12/17/2014, MRI 12/12/2009, paranasal sinus CT 11/04/2009 FINDINGS: CT HEAD FINDINGS Brain: No evidence of acute infarction, hemorrhage, hydrocephalus, extra-axial collection, visible mass lesion or mass effect. Diffuse prominence of the cisterns and sulci compatible with parenchymal volume loss.  Ventricular dilatation is sat Lea out of proportion to the degree of sulcal prominence with slight narrowing of the callososeptal angle and upward bowing of the corpus callosum. Patchy areas of white matter hypoattenuation are most compatible with chronic microvascular angiopathy. Midline structures are normal. Cerebellar tonsils are normally position. Vascular: Atherosclerotic calcification of the carotid siphons and intradural vertebral arteries. No hyperdense vessel. Skull: Right frontal/supraorbital swelling and laceration extending to the temporal soft tissues as well. Thin crescentic scalp hematoma measuring up to 6 mm maximal thickness. No subjacent calvarial fracture or other acute osseous injury of the calvaria. Other: None. CT MAXILLOFACIAL FINDINGS Osseous: No fracture of the bony orbits. Chronic appearing deformities of the nasal bones similar to comparison imaging in 2011. No other mid face fractures are seen. The pterygoid plates are intact. No visible or suspected temporal bone fractures. Temporomandibular joints are normally aligned. The mandible is intact. Numerous dental amalgams and metallic orthotic hardware limit evaluation of the dentition. No clearly fractured or avulsed teeth. Orbits: Some slightly asymmetric right supraorbital swelling and palpebral thickening. No retro septal gas, stranding or hemorrhage. Bilateral lens extractions. Small amount of right sub palpebral gas is a normal finding. Sinuses: Postsurgical changes from prior antrectomies and partial ethmoidectomies. Some pneumatized secretions and thickening are present within the posterior ethmoids on the left with slightly hyperostotic changes likely reflecting an acute on chronic sinusitis. Remaining paranasal sinuses and mastoid air cells are predominantly clear. Middle ear cavities are clear. Ossicular chains are normally configured. Soft tissues: Right periorbital  and supraorbital soft tissue swelling. Mild right malar  contusion and laceration as well. Question some mild pre mental thickening. Soft tissues are otherwise unremarkable without further gas or foreign body. CT CERVICAL SPINE FINDINGS Alignment: Stabilization collar is absent at the time of examination slightly exaggerated cervical lordosis. Minimal anterolisthesis C2 on 3 and retrolisthesis C3 on 4 is favored to be on a degenerative basis with spondylitic changes at these levels. No evidence of traumatic listhesis. No abnormally widened, perched or jumped facets. Normal alignment of the craniocervical articulations accounting for mild rightward cranial rotation. Effacement of the atlantodental interval likely related to advanced arthrosis. Skull base and vertebrae: The osseous structures appear diffusely demineralized which may limit detection of small or nondisplaced fractures. No visible skull base fracture is seen. No discernible fracture of the vertebral bodies or posterior elements. No worrisome lytic or blastic lesions. Multilevel cervical spondylitic changes as below. Advanced arthrosis at the atlantodental and basion dens interval including a likely degenerative os odontoideum. Soft tissues and spinal canal: No pre or paravertebral fluid or swelling. No visible canal hematoma. Some minimal enthesopathic mineralization in the nuchal ligament. Disc levels: Multilevel cervical spondylitic changes are present with diffuse intervertebral disc height loss and posterior ridging with disc osteophyte complexes. Larger complexes at C4-5 and C5-6 result in at most mild canal stenosis with only partial effacement of ventral thecal sac at the remaining cervical levels. Diffuse uncinate spurring and facet hypertrophic changes result in mild-to-moderate multilevel neural foraminal narrowing without severe foraminal impingement in the cervical spine. Upper chest: Some apical emphysematous changes and biapical pleuroparenchymal scarring. Calcification of the proximal great  vessels. Cervical carotid atherosclerosis is noted. Other: No concerning thyroid nodules or masses. Diminutive appearance of the thyroid. IMPRESSION: 1. Right frontal/supraorbital swelling and laceration extending to the temporal soft tissues as well. Thin crescentic scalp hematoma measuring up to 6 mm maximal thickness. No subjacent calvarial fracture or other acute osseous injury. 2. No acute intracranial abnormality. 3. Background of chronic microvascular angiopathy and parenchymal volume loss. Ventricular dilatation is slightly out of proportion to the degree of sulcal prominence with narrowing of the callososeptal angle and upward bowing of the corpus callosum. Findings could suggest a normal pressure hydrocephalus though are similar to comparison imaging. Correlate with clinical symptoms. 4. Mild right malar contusion and laceration. 5. No acute facial bone fracture. 6. Chronic appearing deformities of the nasal bones. 7. Postsurgical changes prior sinus surgery. Residual pneumatized secretions and thickening within the posterior ethmoids on the left with slightly hyperostotic changes likely reflecting an acute on chronic sinusitis. 8. No evidence of acute fracture or traumatic listhesis of the cervical spine. 9. Diffuse cervical spondylitic changes with multifactorial mild canal stenosis and mild-to-moderate multilevel foraminal narrowing as described above. 10. Cervical and intracranial atherosclerosis. 11.  Emphysema (ICD10-J43.9). Electronically Signed   By: Lovena Le M.D.   On: 01/06/2020 04:19   CT Cervical Spine Wo Contrast  Result Date: 01/08/2020 CLINICAL DATA:  Fall, right cheek skin tear and right supraorbital hematoma EXAM: CT HEAD WITHOUT CONTRAST CT MAXILLOFACIAL WITHOUT CONTRAST CT CERVICAL SPINE WITHOUT CONTRAST TECHNIQUE: Multidetector CT imaging of the head, cervical spine, and maxillofacial structures were performed using the standard protocol without intravenous contrast.  Multiplanar CT image reconstructions of the cervical spine and maxillofacial structures were also generated. COMPARISON:  CT head 12/17/2014, MRI 12/12/2009, paranasal sinus CT 11/04/2009 FINDINGS: CT HEAD FINDINGS Brain: No evidence of acute infarction, hemorrhage, hydrocephalus, extra-axial collection, visible mass lesion or mass effect. Diffuse prominence  of the cisterns and sulci compatible with parenchymal volume loss. Ventricular dilatation is sat Lea out of proportion to the degree of sulcal prominence with slight narrowing of the callososeptal angle and upward bowing of the corpus callosum. Patchy areas of white matter hypoattenuation are most compatible with chronic microvascular angiopathy. Midline structures are normal. Cerebellar tonsils are normally position. Vascular: Atherosclerotic calcification of the carotid siphons and intradural vertebral arteries. No hyperdense vessel. Skull: Right frontal/supraorbital swelling and laceration extending to the temporal soft tissues as well. Thin crescentic scalp hematoma measuring up to 6 mm maximal thickness. No subjacent calvarial fracture or other acute osseous injury of the calvaria. Other: None. CT MAXILLOFACIAL FINDINGS Osseous: No fracture of the bony orbits. Chronic appearing deformities of the nasal bones similar to comparison imaging in 2011. No other mid face fractures are seen. The pterygoid plates are intact. No visible or suspected temporal bone fractures. Temporomandibular joints are normally aligned. The mandible is intact. Numerous dental amalgams and metallic orthotic hardware limit evaluation of the dentition. No clearly fractured or avulsed teeth. Orbits: Some slightly asymmetric right supraorbital swelling and palpebral thickening. No retro septal gas, stranding or hemorrhage. Bilateral lens extractions. Small amount of right sub palpebral gas is a normal finding. Sinuses: Postsurgical changes from prior antrectomies and partial  ethmoidectomies. Some pneumatized secretions and thickening are present within the posterior ethmoids on the left with slightly hyperostotic changes likely reflecting an acute on chronic sinusitis. Remaining paranasal sinuses and mastoid air cells are predominantly clear. Middle ear cavities are clear. Ossicular chains are normally configured. Soft tissues: Right periorbital and supraorbital soft tissue swelling. Mild right malar contusion and laceration as well. Question some mild pre mental thickening. Soft tissues are otherwise unremarkable without further gas or foreign body. CT CERVICAL SPINE FINDINGS Alignment: Stabilization collar is absent at the time of examination slightly exaggerated cervical lordosis. Minimal anterolisthesis C2 on 3 and retrolisthesis C3 on 4 is favored to be on a degenerative basis with spondylitic changes at these levels. No evidence of traumatic listhesis. No abnormally widened, perched or jumped facets. Normal alignment of the craniocervical articulations accounting for mild rightward cranial rotation. Effacement of the atlantodental interval likely related to advanced arthrosis. Skull base and vertebrae: The osseous structures appear diffusely demineralized which may limit detection of small or nondisplaced fractures. No visible skull base fracture is seen. No discernible fracture of the vertebral bodies or posterior elements. No worrisome lytic or blastic lesions. Multilevel cervical spondylitic changes as below. Advanced arthrosis at the atlantodental and basion dens interval including a likely degenerative os odontoideum. Soft tissues and spinal canal: No pre or paravertebral fluid or swelling. No visible canal hematoma. Some minimal enthesopathic mineralization in the nuchal ligament. Disc levels: Multilevel cervical spondylitic changes are present with diffuse intervertebral disc height loss and posterior ridging with disc osteophyte complexes. Larger complexes at C4-5 and C5-6  result in at most mild canal stenosis with only partial effacement of ventral thecal sac at the remaining cervical levels. Diffuse uncinate spurring and facet hypertrophic changes result in mild-to-moderate multilevel neural foraminal narrowing without severe foraminal impingement in the cervical spine. Upper chest: Some apical emphysematous changes and biapical pleuroparenchymal scarring. Calcification of the proximal great vessels. Cervical carotid atherosclerosis is noted. Other: No concerning thyroid nodules or masses. Diminutive appearance of the thyroid. IMPRESSION: 1. Right frontal/supraorbital swelling and laceration extending to the temporal soft tissues as well. Thin crescentic scalp hematoma measuring up to 6 mm maximal thickness. No subjacent calvarial fracture or other  acute osseous injury. 2. No acute intracranial abnormality. 3. Background of chronic microvascular angiopathy and parenchymal volume loss. Ventricular dilatation is slightly out of proportion to the degree of sulcal prominence with narrowing of the callososeptal angle and upward bowing of the corpus callosum. Findings could suggest a normal pressure hydrocephalus though are similar to comparison imaging. Correlate with clinical symptoms. 4. Mild right malar contusion and laceration. 5. No acute facial bone fracture. 6. Chronic appearing deformities of the nasal bones. 7. Postsurgical changes prior sinus surgery. Residual pneumatized secretions and thickening within the posterior ethmoids on the left with slightly hyperostotic changes likely reflecting an acute on chronic sinusitis. 8. No evidence of acute fracture or traumatic listhesis of the cervical spine. 9. Diffuse cervical spondylitic changes with multifactorial mild canal stenosis and mild-to-moderate multilevel foraminal narrowing as described above. 10. Cervical and intracranial atherosclerosis. 11.  Emphysema (ICD10-J43.9). Electronically Signed   By: Lovena Le M.D.   On:  12/24/2019 04:19   Pelvis Portable  Result Date: 01/17/2020 CLINICAL DATA:  Status post right hip fracture repair EXAM: PORTABLE PELVIS 1-2 VIEWS COMPARISON:  None. FINDINGS: The right hip fracture is been repaired with a gamma nail and intramedullary rod with a distal interlocking screw. Postoperative changes noted. No other acute abnormalities. IMPRESSION: Right hip fracture repair as above. Electronically Signed   By: Dorise Bullion III M.D   On: 01/13/2020 11:22   DG CHEST PORT 1 VIEW  Result Date: 01/24/2020 CLINICAL DATA:  Shortness of breath. EXAM: PORTABLE CHEST 1 VIEW COMPARISON:  January 21, 2020 FINDINGS: Bilateral pulmonary infiltrates have worsened in the interval, particularly in the upper lobes. There is a layering right effusion with underlying opacity, not seen previously. The cardiomediastinal silhouette is unchanged. No pneumothorax. IMPRESSION: New right pleural effusion and worsening bilateral pulmonary infiltrates worrisome for pneumonia or aspiration. Electronically Signed   By: Dorise Bullion III M.D   On: 01/24/2020 10:17   DG CHEST PORT 1 VIEW  Result Date: 01/19/2020 CLINICAL DATA:  Cough.  Pneumonia. EXAM: PORTABLE CHEST 1 VIEW COMPARISON:  01/18/2020 FINDINGS: Stable cardiac enlargement. Aortic atherosclerosis. Previous median sternotomy, CABG procedure, and aortic valve repair. Unchanged right pleural effusion with veil like opacification of the right lower lobe. The left pleural effusion appears decreased from previous exam. No significant change in bilateral upper lung zone predominant airspace opacities. IMPRESSION: 1. Persistent bilateral upper lung zone predominant airspace opacities. 2. Stable right pleural effusion. 3. Decrease in left pleural effusion. Electronically Signed   By: Kerby Moors M.D.   On: 01/19/2020 10:42   DG CHEST PORT 1 VIEW  Result Date: 01/18/2020 CLINICAL DATA:  Pneumonia EXAM: PORTABLE CHEST 1 VIEW COMPARISON:  01/15/2020 FINDINGS:  The lungs are symmetrically well expanded. Multifocal pulmonary consolidation has progressed in the interval since prior examination, particularly within the right upper lobe, most in keeping with changes of multifocal pneumonia. Superimposed mild probable cardiogenic pulmonary edema persists with perihilar interstitial pulmonary infiltrate and small bilateral pleural effusions. No pneumothorax. Coronary artery bypass grafting and aortic valve replacement has been performed. Cardiac size within normal limits. No acute bone abnormality. IMPRESSION: Progressive multifocal pulmonary infiltrates in keeping with multifocal pneumonia. Suspected superimposed mild persistent cardiogenic pulmonary edema Electronically Signed   By: Fidela Salisbury MD   On: 01/18/2020 07:14   DG CHEST PORT 1 VIEW  Result Date: 01/15/2020 CLINICAL DATA:  Shortness of breath. EXAM: PORTABLE CHEST 1 VIEW COMPARISON:  Chest x-ray from yesterday. FINDINGS: Stable cardiomediastinal silhouette status post CABG and  aortic valve replacement. Worsening hazy airspace disease in the right upper and lower lobes. Unchanged hazy airspace disease in the left mid lung and left lower lobe consolidation. No pneumothorax or large pleural effusion. No acute osseous abnormality. IMPRESSION: 1. Multifocal pneumonia, worsened in the right lung. Electronically Signed   By: Titus Dubin M.D.   On: 01/15/2020 12:29   DG CHEST PORT 1 VIEW  Result Date: 01/14/2020 CLINICAL DATA:  Evaluate pneumonia EXAM: PORTABLE CHEST 1 VIEW COMPARISON:  01/12/2020 FINDINGS: Previous median sternotomy, aortic valve replacement and CABG. Aortic atherosclerotic calcifications identified. Airspace disease within the left midlung and left lower lung are unchanged in the interval. New, developing opacity within the right lower lung and right upper lobe noted. IMPRESSION: 1. Persistent left mid and left lower lung airspace disease. 2. New airspace densities within the right upper  and right lower lung. Electronically Signed   By: Kerby Moors M.D.   On: 01/14/2020 07:31   DG CHEST PORT 1 VIEW  Result Date: 01/12/2020 CLINICAL DATA:  Status post right IM nail fever EXAM: PORTABLE CHEST 1 VIEW COMPARISON:  01/06/2020, 10/15/2017 FINDINGS: Worsening airspace disease in the left lower lung. Mild cardiomegaly with aortic atherosclerosis. No pneumothorax. Post sternotomy changes with valve prosthesis. IMPRESSION: Worsening airspace disease in the left lower lung, suspect for pneumonia or potentially aspiration. Cardiomegaly. Electronically Signed   By: Donavan Foil M.D.   On: 01/12/2020 20:00   DG Knee Right Port  Result Date: 12/24/2019 CLINICAL DATA:  Closed comminuted intertrochanteric femur fracture. EXAM: PORTABLE RIGHT KNEE - 1-2 VIEW COMPARISON:  Hip radiography from earlier today FINDINGS: Negative for knee subluxation or fracture. Osteopenia. Extensive arterial calcification. IMPRESSION: No acute finding. Electronically Signed   By: Monte Fantasia M.D.   On: 01/16/2020 09:09   DG Swallowing Func-Speech Pathology  Result Date: 01/15/2020 Objective Swallowing Evaluation: Type of Study: MBS-Modified Barium Swallow Study  Patient Details Name: JERMAL DISMUKE MRN: [id number] Date of Birth: 20-Mar-1930 Today's Date: 01/15/2020 Time: SLP Start Time (ACUTE ONLY): 9518 -SLP Stop Time (ACUTE ONLY): 0901 SLP Time Calculation (min) (ACUTE ONLY): 26 min Past Medical History: Past Medical History: Diagnosis Date . Allergic rhinitis  . Anemia  . Arthritis   SHOULDER . Asthma with bronchitis  . Colon polyp  . COPD (chronic obstructive pulmonary disease) (HCC)   Astmatic bronchitis . Dysrhythmia 08/2012  NEW ONSET ATRIAL FIBRILATION . Gout   PMH . Headache(784.0)  . HLD (hyperlipidemia)  . Nephrolithiasis  . Osteoporosis  . RLS (restless legs syndrome)  . Skin cancer (melanoma) ([place]) 2012  Right neck . Skin cancer, basal cell   Dr Nevada Crane, [facility] Past Surgical History: Past Surgical History:  Procedure Laterality Date . AORTIC VALVE REPLACEMENT N/A 02/20/2013  Procedure: AORTIC VALVE REPLACEMENT (AVR);  Surgeon: Ivin Poot, MD;  Location: [place];  Service: Open Heart Surgery;  Laterality: N/A; . COLONOSCOPY W/ POLYPECTOMY    Dr Deatra Ina . CORONARY ARTERY BYPASS GRAFT N/A 02/20/2013  Procedure: CORONARY ARTERY BYPASS GRAFTING (CABG);  Surgeon: Ivin Poot, MD;  Location: [place];  Service: Open Heart Surgery;  Laterality: N/A; . FINGER SURGERY    for foreign body . INGUINAL HERNIA REPAIR   . INTRAMEDULLARY (IM) NAIL INTERTROCHANTERIC Right 01/11/2020  Procedure: INTRAMEDULLARY (IM) NAIL INTERTROCHANTRIC;  Surgeon: Rod Can, MD;  Location: [facility];  Service: Orthopedics;  Laterality: Right; . INTRAOPERATIVE TRANSESOPHAGEAL ECHOCARDIOGRAM N/A 02/20/2013  Procedure: INTRAOPERATIVE TRANSESOPHAGEAL ECHOCARDIOGRAM;  Surgeon: Ivin Poot, MD;  Location: [place];  Service: Open Heart Surgery;  Laterality: N/A; . LEFT AND RIGHT HEART CATHETERIZATION WITH CORONARY ANGIOGRAM N/A 01/05/2013  Procedure: LEFT AND RIGHT HEART CATHETERIZATION WITH CORONARY ANGIOGRAM;  Surgeon: Blane Ohara, MD;  Location: [facility] CATH LAB;  Service: Cardiovascular;  Laterality: N/A; . LEG SURGERY  06/2010   Dr.Lawson, for blood clott. Left leg  . NECK SURGERY  12/2010  Melanoma ; [facility]  . NOSE SURGERY    Septal Deviation . RIGHT HEART CATHETERIZATION N/A 01/23/2013  Procedure: RIGHT HEART CATH;  Surgeon: Jolaine Artist, MD;  Location: [place] CATH LAB;  Service: Cardiovascular;  Laterality: N/A; . TEE WITHOUT CARDIOVERSION N/A 01/23/2013  Procedure: TRANSESOPHAGEAL ECHOCARDIOGRAM (TEE);  Surgeon: Jolaine Artist, MD;  Location: [facility] ENDOSCOPY;  Service: Cardiovascular;  Laterality: N/A;  sign heart cath consent w/tee consent HPI: Pt is an 86 y.o. male with medical history significant for hyperlipidemia, CAD, A. fib on warfarin, GERD, CKD stage III, Parkinson's disease who presented to the ED via EMS after sustaining a fall  at home. CXR 11/23: Worsening airspace disease in the left lower lung, suspect for pneumonia or potentially aspiration. Cardiomegaly. Pt was most recently seen by SLP on 03/17/13 and a dysphagia 2 diet with thin liquids was recommended at that time per pt's request d/t thrush. MBS 02/26/13: Pt. exhibited mild oral dysphagia with delayed transit exacerbated by xerostomia and decreased endurance.  Mild-mod sensory pharyngeal dysphagia with reduced tongue base retraction and laryngeal elevation resulting in trace vallecular/pyriform sinus/pharyngeal wall residue.    Concerns for aspiration pna present.  Subjective: pt asleep in bed - aroused however in his room prior to transport Assessment / Plan / Recommendation CHL IP CLINICAL IMPRESSIONS 01/15/2020 Clinical Impression Moderate oral and mild pharyngeal dysphagia without overt aspiration.  Decreased oral coordination/transiting/strength noted with vertical mastication pattern.  Pt noted to have secretions retained in phayrnx which mixed with barium.  Minimal laryngeal penetration of thin liquids noted due to decreased laryngeal closure.  No coughing noted during testing however pt coughed immediately after testing with water intake.  Retention noted in vallecular space due to decreased tongue base retraction/epiglottic deflection.  Attempts at chin tuck were not successful.  Sensation of retention in phayrnx noted by pt only intermittently.  Recommend pt continue dys1/nectar diet at this time due to conserve energy, allowing tsps of thin.  Recommend pt's wife be allowed to provide soft whole foods in small amounts.  Follow up SLP to help strengthen pt's swallowing and pulmonary clearance.  Question if pt may have thrush, ? coating in oral cavity that did not clear with oral care/suctioning.  Will follow up. SLP Visit Diagnosis Dysphagia, unspecified (R13.10) Attention and concentration deficit following -- Frontal lobe and executive function deficit following -- Impact  on safety and function Mild aspiration risk;Moderate aspiration risk   CHL IP TREATMENT RECOMMENDATION 01/15/2020 Treatment Recommendations Therapy as outlined in treatment plan below   Prognosis 01/15/2020 Prognosis for Safe Diet Advancement -- Barriers to Reach Goals Cognitive deficits Barriers/Prognosis Comment -- CHL IP DIET RECOMMENDATION 01/15/2020 SLP Diet Recommendations Dysphagia 1 (Puree) solids;Nectar thick liquid Liquid Administration via Straw;Cup Medication Administration Crushed with puree Compensations Slow rate;Small sips/bites;Other (Comment) Postural Changes --   CHL IP OTHER RECOMMENDATIONS 01/15/2020 Recommended Consults -- Oral Care Recommendations Oral care QID Other Recommendations Order thickener from pharmacy   CHL IP FOLLOW UP RECOMMENDATIONS 01/15/2020 Follow up Recommendations Skilled Nursing facility   [facility] IP FREQUENCY AND DURATION 01/15/2020 Speech Therapy Frequency (ACUTE ONLY) min 2x/week Treatment Duration  2 weeks      CHL IP ORAL PHASE 01/15/2020 Oral Phase Impaired Oral - Pudding Teaspoon -- Oral - Pudding Cup -- Oral - Honey Teaspoon -- Oral - Honey Cup -- Oral - Nectar Teaspoon Decreased bolus cohesion;Weak lingual manipulation Oral - Nectar Cup -- Oral - Nectar Straw Decreased bolus cohesion;Weak lingual manipulation Oral - Thin Teaspoon Decreased bolus cohesion;Weak lingual manipulation;Reduced posterior propulsion Oral - Thin Cup Decreased bolus cohesion;Weak lingual manipulation;Reduced posterior propulsion Oral - Thin Straw Decreased bolus cohesion;Weak lingual manipulation;Reduced posterior propulsion Oral - Puree Decreased bolus cohesion;Weak lingual manipulation;Reduced posterior propulsion Oral - Mech Soft Impaired mastication;Weak lingual manipulation;Decreased bolus cohesion;Reduced posterior propulsion;Lingual/palatal residue;Premature spillage Oral - Regular -- Oral - Multi-Consistency -- Oral - Pill -- Oral Phase - Comment --  CHL IP PHARYNGEAL PHASE 01/15/2020  Pharyngeal Phase Impaired Pharyngeal- Pudding Teaspoon -- Pharyngeal -- Pharyngeal- Pudding Cup -- Pharyngeal -- Pharyngeal- Honey Teaspoon -- Pharyngeal -- Pharyngeal- Honey Cup -- Pharyngeal -- Pharyngeal- Nectar Teaspoon -- Pharyngeal -- Pharyngeal- Nectar Cup Pharyngeal residue - valleculae Pharyngeal Material does not enter airway Pharyngeal- Nectar Straw Reduced epiglottic inversion;Pharyngeal residue - valleculae Pharyngeal Material does not enter airway Pharyngeal- Thin Teaspoon Delayed swallow initiation-pyriform sinuses;Reduced tongue base retraction;Pharyngeal residue - valleculae Pharyngeal -- Pharyngeal- Thin Cup Delayed swallow initiation-pyriform sinuses;Penetration/Aspiration during swallow;Pharyngeal residue - valleculae Pharyngeal Material enters airway, CONTACTS cords and then ejected out Pharyngeal- Thin Straw Reduced tongue base retraction;Pharyngeal residue - valleculae;Penetration/Aspiration during swallow Pharyngeal Material enters airway, CONTACTS cords and then ejected out Pharyngeal- Puree Pharyngeal residue - valleculae;Reduced tongue base retraction Pharyngeal Material does not enter airway Pharyngeal- Mechanical Soft Delayed swallow initiation-vallecula;Reduced tongue base retraction;Pharyngeal residue - valleculae Pharyngeal Material does not enter airway Pharyngeal- Regular -- Pharyngeal -- Pharyngeal- Multi-consistency -- Pharyngeal -- Pharyngeal- Pill -- Pharyngeal -- Pharyngeal Comment --  CHL IP CERVICAL ESOPHAGEAL PHASE 01/15/2020 Cervical Esophageal Phase Impaired Pudding Teaspoon -- Pudding Cup -- Honey Teaspoon -- Honey Cup -- Nectar Teaspoon -- Nectar Cup -- Nectar Straw -- Thin Teaspoon -- Thin Cup -- Thin Straw -- Puree -- Mechanical Soft -- Regular -- Multi-consistency -- Pill -- Cervical Esophageal Comment prominent cricopharyngeus did not impair barium flow Kathleen Lime, MS [facility] SLP Acute Rehab Services Office [phone] Pager [phone] Macario Golds 01/15/2020,  9:26 AM              DG C-Arm 1-60 Min-No Report  Result Date: 01/12/2020 Fluoroscopy was utilized by the requesting physician.  No radiographic interpretation.   DG HIPS BILAT WITH PELVIS 2V  Result Date: 01/22/2020 CLINICAL DATA:  Bilateral hip and knee pain. Recent right hip surgery. EXAM: DG HIP (WITH OR WITHOUT PELVIS) 2V BILAT COMPARISON:  01/07/2020 FINDINGS: Surgical fixation of right intertrochanteric fracture with satisfactory alignment. No new fracture.  Normal left hip joint.  No pelvic bone lesion. IMPRESSION: ORIF right intertrochanteric fracture.  No new finding. Electronically Signed   By: Franchot Gallo M.D.   On: 01/22/2020 12:51   DG HIP OPERATIVE UNILAT W OR W/O PELVIS RIGHT  Result Date: 12/29/2019 CLINICAL DATA:  Right hip fracture repair EXAM: OPERATIVE RIGHT HIP (WITH PELVIS IF PERFORMED)  VIEWS TECHNIQUE: Fluoroscopic spot image(s) were submitted for interpretation post-operatively. FLUOROSCOPY TIME:  40 seconds. Images: Two COMPARISON:  None. FINDINGS: A gamma nail and intramedullary femoral rod have been placed across the right hip fracture. Alignment is improved in the interval. No dislocation. A distal interlocking screw is noted. IMPRESSION: Right hip fracture repair as above. Electronically Signed   By: Shanon Brow  Mee Hives M.D   On: 12/28/2019 11:22   DG Hip Unilat W or Wo Pelvis 2-3 Views Right  Result Date: 12/29/2019 CLINICAL DATA:  Initial evaluation for acute trauma, fall. EXAM: DG HIP (WITH OR WITHOUT PELVIS) 2-3V RIGHT COMPARISON:  None. FINDINGS: Acute comminuted intratrochanteric fracture of the right hip with superior subluxation. Bony pelvis grossly intact. Limited views of the left hip demonstrate no acute finding. Underlying osteopenia. Prominent vascular calcifications about the proximal thighs. Degenerative change noted within the lower lumbar spine. IMPRESSION: Acute comminuted intertrochanteric fracture of the right hip. Electronically Signed   By:  Jeannine Boga M.D.   On: 01/14/2020 02:10   CT Maxillofacial WO CM  Result Date: 01/15/2020 CLINICAL DATA:  Fall, right cheek skin tear and right supraorbital hematoma EXAM: CT HEAD WITHOUT CONTRAST CT MAXILLOFACIAL WITHOUT CONTRAST CT CERVICAL SPINE WITHOUT CONTRAST TECHNIQUE: Multidetector CT imaging of the head, cervical spine, and maxillofacial structures were performed using the standard protocol without intravenous contrast. Multiplanar CT image reconstructions of the cervical spine and maxillofacial structures were also generated. COMPARISON:  CT head 12/17/2014, MRI 12/12/2009, paranasal sinus CT 11/04/2009 FINDINGS: CT HEAD FINDINGS Brain: No evidence of acute infarction, hemorrhage, hydrocephalus, extra-axial collection, visible mass lesion or mass effect. Diffuse prominence of the cisterns and sulci compatible with parenchymal volume loss. Ventricular dilatation is sat Lea out of proportion to the degree of sulcal prominence with slight narrowing of the callososeptal angle and upward bowing of the corpus callosum. Patchy areas of white matter hypoattenuation are most compatible with chronic microvascular angiopathy. Midline structures are normal. Cerebellar tonsils are normally position. Vascular: Atherosclerotic calcification of the carotid siphons and intradural vertebral arteries. No hyperdense vessel. Skull: Right frontal/supraorbital swelling and laceration extending to the temporal soft tissues as well. Thin crescentic scalp hematoma measuring up to 6 mm maximal thickness. No subjacent calvarial fracture or other acute osseous injury of the calvaria. Other: None. CT MAXILLOFACIAL FINDINGS Osseous: No fracture of the bony orbits. Chronic appearing deformities of the nasal bones similar to comparison imaging in 2011. No other mid face fractures are seen. The pterygoid plates are intact. No visible or suspected temporal bone fractures. Temporomandibular joints are normally aligned. The  mandible is intact. Numerous dental amalgams and metallic orthotic hardware limit evaluation of the dentition. No clearly fractured or avulsed teeth. Orbits: Some slightly asymmetric right supraorbital swelling and palpebral thickening. No retro septal gas, stranding or hemorrhage. Bilateral lens extractions. Small amount of right sub palpebral gas is a normal finding. Sinuses: Postsurgical changes from prior antrectomies and partial ethmoidectomies. Some pneumatized secretions and thickening are present within the posterior ethmoids on the left with slightly hyperostotic changes likely reflecting an acute on chronic sinusitis. Remaining paranasal sinuses and mastoid air cells are predominantly clear. Middle ear cavities are clear. Ossicular chains are normally configured. Soft tissues: Right periorbital and supraorbital soft tissue swelling. Mild right malar contusion and laceration as well. Question some mild pre mental thickening. Soft tissues are otherwise unremarkable without further gas or foreign body. CT CERVICAL SPINE FINDINGS Alignment: Stabilization collar is absent at the time of examination slightly exaggerated cervical lordosis. Minimal anterolisthesis C2 on 3 and retrolisthesis C3 on 4 is favored to be on a degenerative basis with spondylitic changes at these levels. No evidence of traumatic listhesis. No abnormally widened, perched or jumped facets. Normal alignment of the craniocervical articulations accounting for mild rightward cranial rotation. Effacement of the atlantodental interval likely related to advanced arthrosis. Skull base and vertebrae: The osseous  structures appear diffusely demineralized which may limit detection of small or nondisplaced fractures. No visible skull base fracture is seen. No discernible fracture of the vertebral bodies or posterior elements. No worrisome lytic or blastic lesions. Multilevel cervical spondylitic changes as below. Advanced arthrosis at the atlantodental  and basion dens interval including a likely degenerative os odontoideum. Soft tissues and spinal canal: No pre or paravertebral fluid or swelling. No visible canal hematoma. Some minimal enthesopathic mineralization in the nuchal ligament. Disc levels: Multilevel cervical spondylitic changes are present with diffuse intervertebral disc height loss and posterior ridging with disc osteophyte complexes. Larger complexes at C4-5 and C5-6 result in at most mild canal stenosis with only partial effacement of ventral thecal sac at the remaining cervical levels. Diffuse uncinate spurring and facet hypertrophic changes result in mild-to-moderate multilevel neural foraminal narrowing without severe foraminal impingement in the cervical spine. Upper chest: Some apical emphysematous changes and biapical pleuroparenchymal scarring. Calcification of the proximal great vessels. Cervical carotid atherosclerosis is noted. Other: No concerning thyroid nodules or masses. Diminutive appearance of the thyroid. IMPRESSION: 1. Right frontal/supraorbital swelling and laceration extending to the temporal soft tissues as well. Thin crescentic scalp hematoma measuring up to 6 mm maximal thickness. No subjacent calvarial fracture or other acute osseous injury. 2. No acute intracranial abnormality. 3. Background of chronic microvascular angiopathy and parenchymal volume loss. Ventricular dilatation is slightly out of proportion to the degree of sulcal prominence with narrowing of the callososeptal angle and upward bowing of the corpus callosum. Findings could suggest a normal pressure hydrocephalus though are similar to comparison imaging. Correlate with clinical symptoms. 4. Mild right malar contusion and laceration. 5. No acute facial bone fracture. 6. Chronic appearing deformities of the nasal bones. 7. Postsurgical changes prior sinus surgery. Residual pneumatized secretions and thickening within the posterior ethmoids on the left with  slightly hyperostotic changes likely reflecting an acute on chronic sinusitis. 8. No evidence of acute fracture or traumatic listhesis of the cervical spine. 9. Diffuse cervical spondylitic changes with multifactorial mild canal stenosis and mild-to-moderate multilevel foraminal narrowing as described above. 10. Cervical and intracranial atherosclerosis. 11.  Emphysema (ICD10-J43.9). Electronically Signed   By: Lovena Le M.D.   On: 01/08/2020 04:19       Subjective: Patient seen and examined at the bedside this morning.  Still tachypneic, lethargic, short of breath.    Discharge Exam: Vitals:   01/25/20 0637 01/25/20 1005  BP:  (!) 147/66  Pulse: 97 94  Resp: (!) 22 (!) 35  Temp:  99.2 F (37.3 C)  SpO2: 95% (!) 87%   Vitals:   01/24/20 2233 01/25/20 0600 01/25/20 0637 01/25/20 1005  BP:  (!) 144/58  (!) 147/66  Pulse: 98 71 97 94  Resp: 17 (!) 26 (!) 22 (!) 35  Temp:    99.2 F (37.3 C)  TempSrc:    Axillary  SpO2: 91% (!) 88% 95% (!) 87%  Weight:   61.4 kg   Height:        General: Lethargic, terminally ill Cardiovascular: afib with RVR Respiratory: Bilateral rhonchi Abdominal: Soft, NT, ND, bowel sounds + Extremities: no edema, no cyanosis    The results of significant diagnostics from this hospitalization (including imaging, microbiology, ancillary and laboratory) are listed below for reference.     Microbiology: No results found for this or any previous visit (from the past 240 hour(s)).   Labs: BNP (last 3 results) Recent Labs    01/18/20 1152  BNP 330.6*  Basic Metabolic Panel: Recent Labs  Lab 01/19/20 0514 01/19/20 0514 01/20/20 0456 01/20/20 0456 01/20/20 1942 01/21/20 0432 01/22/20 0422 01/23/20 0613 01/24/20 0421 01/25/20 0435  NA 140   < > 141   < >  --  142 142 142 146* 149*  K 3.7   < > 3.5   < >  --  3.4* 3.8 3.4* 5.0 4.3  CL 111   < > 111   < >  --  112* 111 109 115* 117*  CO2 19*   < > 20*   < >  --  21* 19* 20* 16* 19*   GLUCOSE 148*   < > 135*   < >  --  143* 124* 133* 138* 138*  BUN 34*   < > 34*   < >  --  33* 30* 31* 32* 41*  CREATININE 1.42*   < > 1.37*   < >  --  1.00 1.34* 1.43* 1.43* 1.61*  CALCIUM 7.8*   < > 8.0*   < >  --  8.0* 8.1* 8.1* 8.3* 8.2*  MG 2.3  --  2.3  --  2.3  --  2.3  --   --   --   PHOS 2.7  --  3.0  --  2.6 2.8  --   --   --   --    < > = values in this interval not displayed.   Liver Function Tests: Recent Labs  Lab 01/19/20 0514 01/20/20 0456 01/21/20 0432 01/22/20 0422  AST 52* 52* 47* 38  ALT 27 25 30 11   ALKPHOS 146* 168* 192* 165*  BILITOT 1.6* 2.6* 2.0* 2.0*  PROT 6.1* 6.5 6.7 6.5  ALBUMIN 2.4* 2.4* 2.4* 2.2*   No results for input(s): LIPASE, AMYLASE in the last 168 hours. No results for input(s): AMMONIA in the last 168 hours. CBC: Recent Labs  Lab 01/20/20 0456 01/20/20 0456 01/21/20 0432 01/22/20 0422 01/23/20 0613 01/24/20 0421 01/25/20 0435  WBC 17.9*   < > 17.0* 15.1* 14.4* 18.8* 15.1*  NEUTROABS 14.2*  --   --  11.9* 11.5* 16.1* 12.0*  HGB 7.7*   < > 7.8* 7.6* 8.0* 8.3* 8.2*  HCT 23.7*   < > 24.4* 23.2* 25.1* 28.4* 27.9*  MCV 90.8   < > 92.8 89.6 91.6 99.6 97.9  PLT 288   < > 322 333 371 402* 410*   < > = values in this interval not displayed.   Cardiac Enzymes: No results for input(s): CKTOTAL, CKMB, CKMBINDEX, TROPONINI in the last 168 hours. BNP: Invalid input(s): POCBNP CBG: Recent Labs  Lab 01/24/20 1146 01/24/20 1625 01/24/20 2054 01/25/20 0715 01/25/20 1154  GLUCAP 108* 121* 117* 120* 147*   D-Dimer No results for input(s): DDIMER in the last 72 hours. Hgb A1c No results for input(s): HGBA1C in the last 72 hours. Lipid Profile No results for input(s): CHOL, HDL, LDLCALC, TRIG, CHOLHDL, LDLDIRECT in the last 72 hours. Thyroid function studies No results for input(s): TSH, T4TOTAL, T3FREE, THYROIDAB in the last 72 hours.  Invalid input(s): FREET3 Anemia work up No results for input(s): VITAMINB12, FOLATE, FERRITIN,  TIBC, IRON, RETICCTPCT in the last 72 hours. Urinalysis    Component Value Date/Time   COLORURINE YELLOW 01/13/2020 0120   APPEARANCEUR CLEAR 01/13/2020 0120   LABSPEC 1.019 01/13/2020 0120   PHURINE 5.0 01/13/2020 0120   GLUCOSEU NEGATIVE 01/13/2020 0120   GLUCOSEU NEGATIVE 10/16/2017 1613   HGBUR NEGATIVE 01/13/2020 0120  BILIRUBINUR NEGATIVE 01/13/2020 0120   KETONESUR 5 (A) 01/13/2020 0120   PROTEINUR 30 (A) 01/13/2020 0120   UROBILINOGEN 0.2 10/16/2017 1613   NITRITE NEGATIVE 01/13/2020 0120   LEUKOCYTESUR MODERATE (A) 01/13/2020 0120   Sepsis Labs Invalid input(s): PROCALCITONIN,  WBC,  LACTICIDVEN Microbiology No results found for this or any previous visit (from the past 240 hour(s)).  Please note: You were cared for by a hospitalist during your hospital stay. Once you are discharged, your primary care physician will handle any further medical issues. Please note that NO REFILLS for any discharge medications will be authorized once you are discharged, as it is imperative that you return to your primary care physician (or establish a relationship with a primary care physician if you do not have one) for your post hospital discharge needs so that they can reassess your need for medications and monitor your lab values.    Time coordinating discharge: 40 minutes  SIGNED:   Shelly Coss, MD  [facility] 01/25/2020, 12:48 PM Pager [phone]  If 7PM-7AM, please contact night-coverage [email or web address] Password [email or web address]

## 2020-01-25 NOTE — Plan of Care (Signed)
  Problem: Clinical Measurements: Goal: Respiratory complications will improve Outcome: Not Progressing Goal: Cardiovascular complication will be avoided Outcome: Not Progressing

## 2020-01-25 NOTE — Progress Notes (Signed)
SLP Cancellation Note  Patient Details Name: Robert Richard MRN: [id number] DOB: 1930/05/09   Cancelled treatment:       Reason Eval/Treat Not Completed: Other (comment);Medical issues which prohibited therapy (pt now comfort care, SLP to sign off, Thanks.)  Kathleen Lime, MS [place] Office [phone] Pager [phone]    Macario Golds 01/25/2020, 12:56 PM

## 2020-01-25 NOTE — Progress Notes (Signed)
Rapid Response called Pt sustaining HR 140-155 Pt alert to self and following commands titrated Caredizem drip current rate 12.5 metoprolol given HR decreased sustaning low 100s will continue to monitor.

## 2020-01-25 NOTE — Progress Notes (Signed)
PHARMACY NOTE:  ANTIMICROBIAL RENAL DOSAGE ADJUSTMENT  Current antimicrobial regimen includes a mismatch between antimicrobial dosage and estimated renal function.  As per policy approved by the Pharmacy & Therapeutics and Medical Executive Committees, the antimicrobial dosage will be adjusted accordingly.  Current antimicrobial dosage:  Unasyn 3g IV q6h  Indication: aspiration pneumonia   Renal Function:  Estimated Creatinine Clearance: 28.1 mL/min (A) (by C-G formula based on SCr of 1.61 mg/dL (H)). []      On intermittent HD, scheduled: []      On CRRT    Antimicrobial dosage has been changed to:  Unasyn 3g IV q12h  Additional comments:   Thank you for allowing pharmacy to be a part of this patient's care.  Phillis Haggis, [facility] 01/25/2020 9:27 AM

## 2020-01-25 NOTE — TOC Progression Note (Addendum)
Transition of Care [facility]) - Progression Note    Patient Details  Name: Robert Richard MRN: [id number] Date of Birth: 07-17-1930  Transition of Care [facility]) CM/SW Contact  Ross Ludwig, [place] Phone Number: 01/25/2020, 9:51 AM  Clinical Narrative:     CSW was informed that patient's wife has decided to go to [facility] now.  CSW attempted to call Cassandra at [facility] [place] left a message on voice mail awaiting for a call back.  10:15am  CSW received phone call back from [place] at [facility], she will review patient's information and contact CSW back with decision.  1:00pm CSW spoke to [place], and she stated she has not reviewed patient yet, and will let CSW know in 1 to 2 hours.  CSW awaiting response back.  CSW informed her that per physician patient is ready for discharge once bed is available.  4:55pm  CSW spoke to [place] at [facility], she said patient is still being reviewed for hospice placement.  Per Cassandra, she will call CSW back once decision has been made.  Expected Discharge Plan: Skilled Nursing Facility Barriers to Discharge: Ship broker, Continued Medical Work up  Expected Discharge Plan and Services Expected Discharge Plan: [place] Choice: [place] arrangements for the past 2 months: Single Family Home                                       Social Determinants of Health (SDOH) Interventions    Readmission Risk Interventions No flowsheet data found.

## 2020-01-25 NOTE — Progress Notes (Signed)
PHARMACY NOTE -  Unasyn  Pharmacy has been assisting with dosing of Unasyn for aspiration PNA.  Dosage at 3g IV q12 hr and need for further dosage adjustment to be made per standing Pharmacy abx renal adjustment protocol  Pharmacy will sign off, following peripherally for culture results or dose adjustments. Please reconsult if a change in clinical status warrants re-evaluation of dosage.  Reuel Boom, PharmD, BCPS [phone] 01/25/2020, 1:09 PM

## 2020-01-25 NOTE — Progress Notes (Signed)
Family unable to understand about patient being comfort care and wants him back on monitor and medicine. RN explain about patient PRN medicine that make the patient comfortable but family still refused. N.P. informed. Charge RN informed.

## 2020-01-26 LAB — CBC WITH DIFFERENTIAL/PLATELET
Abs Immature Granulocytes: 0.28 10*3/uL — ABNORMAL HIGH (ref 0.00–0.07)
Basophils Absolute: 0 10*3/uL (ref 0.0–0.1)
Basophils Relative: 0 %
Eosinophils Absolute: 0 10*3/uL (ref 0.0–0.5)
Eosinophils Relative: 0 %
HCT: 27.8 % — ABNORMAL LOW (ref 39.0–52.0)
Hemoglobin: 8.4 g/dL — ABNORMAL LOW (ref 13.0–17.0)
Immature Granulocytes: 2 %
Lymphocytes Relative: 11 %
Lymphs Abs: 1.7 10*3/uL (ref 0.7–4.0)
MCH: 29.4 pg (ref 26.0–34.0)
MCHC: 30.2 g/dL (ref 30.0–36.0)
MCV: 97.2 fL (ref 80.0–100.0)
Monocytes Absolute: 1 10*3/uL (ref 0.1–1.0)
Monocytes Relative: 6 %
Neutro Abs: 12.1 10*3/uL — ABNORMAL HIGH (ref 1.7–7.7)
Neutrophils Relative %: 81 %
Platelets: 438 10*3/uL — ABNORMAL HIGH (ref 150–400)
RBC: 2.86 MIL/uL — ABNORMAL LOW (ref 4.22–5.81)
RDW: 15.6 % — ABNORMAL HIGH (ref 11.5–15.5)
WBC: 15.1 10*3/uL — ABNORMAL HIGH (ref 4.0–10.5)
nRBC: 0.2 % (ref 0.0–0.2)

## 2020-01-26 LAB — BASIC METABOLIC PANEL
Anion gap: 15 (ref 5–15)
BUN: 70 mg/dL — ABNORMAL HIGH (ref 8–23)
CO2: 20 mmol/L — ABNORMAL LOW (ref 22–32)
Calcium: 8 mg/dL — ABNORMAL LOW (ref 8.9–10.3)
Chloride: 120 mmol/L — ABNORMAL HIGH (ref 98–111)
Creatinine, Ser: 3.17 mg/dL — ABNORMAL HIGH (ref 0.61–1.24)
GFR, Estimated: 18 mL/min — ABNORMAL LOW (ref >60)
Glucose, Bld: 142 mg/dL — ABNORMAL HIGH (ref 70–99)
Potassium: 5.6 mmol/L — ABNORMAL HIGH (ref 3.5–5.1)
Sodium: 155 mmol/L — ABNORMAL HIGH (ref 135–145)

## 2020-01-28 ENCOUNTER — Other Ambulatory Visit: Payer: Medicare Other

## 2020-02-02 ENCOUNTER — Ambulatory Visit: Payer: Medicare Other | Admitting: Internal Medicine

## 2020-02-20 NOTE — Progress Notes (Signed)
With RN at bedside patient took last breath. Patient spouse and son at bedside. Sophia RN and Saina Waage RN ausculted/palpated no HR or Resp. Time of death 80. MD paged.  Lorilei Horan A [employer] RN

## 2020-02-20 NOTE — Progress Notes (Signed)
Patient seen and examined at the bedside this morning.  Wife at the bedside.  He is currently on full comfort care.  He is waiting for bed at residential hospice.  Social worker aware.  Discharge orders and summary are in place since 01/25/20.  Overall, he looks comfortable without any distress. No change in the management plan

## 2020-02-20 NOTE — Plan of Care (Signed)

## 2020-02-20 NOTE — Progress Notes (Signed)
Notified by CN, pt is Medical Examiner case, related to recent falls. Spoke to [employer], Medical Examiner [phone],  Pt will transport to morgue and Idanha will review chart and examine pt, then release body. Spouse and son called and made aware of the situation. SRP, RN

## 2020-02-20 NOTE — Progress Notes (Signed)
Pt death pronounced at 66 by writer and Tye Savoy, RN, per MD order. Pt spouse and son at bedside. Witnessed event. No respiration no audible breath sounds, No palpable  Carotid, Apical, Radial, Femoral Pulses noted. MD made aware and update. Family waiting on other family members to arrive. SRP, RN

## 2020-02-20 NOTE — TOC Progression Note (Addendum)
Transition of Care [facility]) - Progression Note    Patient Details  Name: TYRECK BELL MRN: [id number] Date of Birth: [birth year]  Transition of Care [facility]) CM/SW Contact  Ross Ludwig, [place] Phone Number: Feb 04, 2020, 10:08 AM  Clinical Narrative:     CSW spoke to [place] at [facility], they do not have a bed available today.  They will let CSW know once bed is available.  CSW to continue to follow patient's progress throughout discharge planning.  5:30pm  CSW was informed that patient has passed away.  CSW notified [facility] and left a message on [employer].  CSW signing off.   Expected Discharge Plan: Skilled Nursing Facility Barriers to Discharge: Ship broker, Continued Medical Work up  Expected Discharge Plan and Services Expected Discharge Plan: [place] Choice: [place] arrangements for the past 2 months: Single Family Home Expected Discharge Date: 04-Feb-2020                                     Social Determinants of Health (SDOH) Interventions    Readmission Risk Interventions No flowsheet data found.

## 2020-02-20 NOTE — Progress Notes (Signed)
PT Cancellation Note  Patient Details Name: Robert Richard MRN: [id number] DOB: [birth year]   Cancelled Treatment:    Reason Eval/Treat Not Completed:  Plan is now for residential hospice. Will sign off.    [facility]  Office: [phone] Pager: [phone]

## 2020-02-20 NOTE — Discharge Summary (Signed)
Death Summary  Robert Richard:[id number] DOB: 1930/03/18 DOA: 02/06/20  PCP: [name], MD  Admit date: 02-06-20 Date of Death: 03-03-20 Time of Death: 1653/06/08   History of present illness:  Patient is 87 year old male with history of coronary disease, hyperlipidemia, A. fib on warfarin, GERD, CKD stage III, Parkinson's disease, dementia who presented to the emergency department after falling at home. Patient also sustained facial laceration with a scalp contusion after fall. He was complaining of right hip pain. X-ray of the hip on presentation showed right hip fracture. He was seen by orthopedics and he underwent intramedullary fixation of the right femur . Hospital course also remarkable for aspiration pneumonia, UTI from Pseudomonas and another fall on 01/21/2020.  On the early morning of 01/24/2020, patient became hypoxic, tachypneic, went into RVR, lethargic. X-ray showed diffuse bilateral opacities suggesting for new aspiration pneumonia. After,extensive discussion  with the family members, family decided on residential hospice and he was started on full comfort care. Patient expired while inpatient before he was transferred to residential hospice.   Final Diagnoses:  1.Acute respiratory failure due to aspiration pneumonia      The results of significant diagnostics from this hospitalization (including imaging, microbiology, ancillary and laboratory) are listed below for reference.    Significant Diagnostic Studies: DG Chest 1 View  Result Date: 01/21/2020 CLINICAL DATA:  Dyspnea on exertion EXAM: CHEST  1 VIEW COMPARISON:  01/19/2020 chest radiograph. FINDINGS: Intact sternotomy wires. Aortic valve prosthesis in place. Stable cardiomediastinal silhouette with mild cardiomegaly. No pneumothorax. Trace right pleural effusion, similar. No significant left pleural effusion. Patchy diffuse bilateral lung opacities, most prominent in the upper lungs, stable to slightly  improved. IMPRESSION: 1. Stable to slightly improved patchy diffuse bilateral lung opacities, most prominent in the upper lungs, favoring multilobar pneumonia. 2. Stable trace right pleural effusion. 3. Stable mild cardiomegaly. Electronically Signed   By: Ilona Sorrel M.D.   On: 01/21/2020 17:09   DG Chest 1 View  Result Date: 01/10/2020 CLINICAL DATA:  Initial evaluation for preoperative evaluation, hip fracture. EXAM: CHEST  1 VIEW COMPARISON:  Prior radiograph from 10/15/2017 FINDINGS: Median sternotomy wires underlying CABG markers and valvular prosthesis noted. Mild exaggeration of the cardiac silhouette related AP technique. Mediastinal silhouette normal. Aortic atherosclerosis. Lungs mildly hypoinflated. Associated mild patchy left basilar opacity, likely atelectasis. No focal infiltrates. Mild perihilar vascular congestion without overt pulmonary edema. No pleural effusion. No pneumothorax. No acute osseous finding.  Osteopenia. IMPRESSION: 1. Hypoinflation with mild patchy left basilar opacity, likely atelectasis. 2. 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 3.  Aortic Atherosclerosis (ICD10-I70.0). Electronically Signed   By: Jeannine Boga M.D.   On: 01/01/2020 02:12   DG Knee 1-2 Views Left  Result Date: 01/22/2020 CLINICAL DATA:  Knee pain and stiffness. EXAM: LEFT KNEE - 1-2 VIEW COMPARISON:  None. FINDINGS: Normal alignment no fracture. Joint spaces normal. No effusion. Arterial calcification. IMPRESSION: Normal left knee Electronically Signed   By: Franchot Gallo M.D.   On: 01/22/2020 12:52   DG Knee 1-2 Views Right  Result Date: 01/22/2020 CLINICAL DATA:  Bilateral hip and knee pain. EXAM: RIGHT KNEE - 1-2 VIEW COMPARISON:  None. FINDINGS: Normal alignment no fracture. Joint spaces intact. Arterial calcification. Surgical clips in the medial soft tissues. IMPRESSION: No acute abnormality.  Normal knee joint. Electronically Signed   By: Franchot Gallo M.D.   On:  01/22/2020 12:51   CT HEAD WO CONTRAST  Result Date: 01/13/2020 CLINICAL DATA:  Altered mental status. EXAM: CT HEAD WITHOUT CONTRAST TECHNIQUE: Contiguous axial images were obtained from the base of the skull through the vertex without intravenous contrast. COMPARISON:  January 09, 2020 FINDINGS: Brain: There is mild to moderate severity cerebral atrophy with widening of the extra-axial spaces and ventricular dilatation. There are areas of decreased attenuation within the white matter tracts of the supratentorial brain, consistent with microvascular disease changes. Vascular: No hyperdense vessel or unexpected calcification. Skull: Normal. Negative for fracture or focal lesion. Sinuses/Orbits: No acute finding. Other: Mild right frontal and right frontoparietal scalp soft tissue swelling is seen. IMPRESSION: 1. Mild right frontal and right frontoparietal scalp soft tissue swelling without evidence of an acute fracture or acute intracranial abnormality. 2. Cerebral atrophy and microvascular disease changes of the supratentorial brain. Electronically Signed   By: Virgina Norfolk M.D.   On: 01/13/2020 19:55   CT Head Wo Contrast  Result Date: 12/21/2019 CLINICAL DATA:  Fall, right cheek skin tear and right supraorbital hematoma EXAM: CT HEAD WITHOUT CONTRAST CT MAXILLOFACIAL WITHOUT CONTRAST CT CERVICAL SPINE WITHOUT CONTRAST TECHNIQUE: Multidetector CT imaging of the head, cervical spine, and maxillofacial structures were performed using the standard protocol without intravenous contrast. Multiplanar CT image reconstructions of the cervical spine and maxillofacial structures were also generated. COMPARISON:  CT head 12/17/2014, MRI 12/12/2009, paranasal sinus CT 11/04/2009 FINDINGS: CT HEAD FINDINGS Brain: No evidence of acute infarction, hemorrhage, hydrocephalus, extra-axial collection, visible mass lesion or mass effect. Diffuse prominence of the cisterns and sulci compatible with parenchymal volume  loss. Ventricular dilatation is sat Lea out of proportion to the degree of sulcal prominence with slight narrowing of the callososeptal angle and upward bowing of the corpus callosum. Patchy areas of white matter hypoattenuation are most compatible with chronic microvascular angiopathy. Midline structures are normal. Cerebellar tonsils are normally position. Vascular: Atherosclerotic calcification of the carotid siphons and intradural vertebral arteries. No hyperdense vessel. Skull: Right frontal/supraorbital swelling and laceration extending to the temporal soft tissues as well. Thin crescentic scalp hematoma measuring up to 6 mm maximal thickness. No subjacent calvarial fracture or other acute osseous injury of the calvaria. Other: None. CT MAXILLOFACIAL FINDINGS Osseous: No fracture of the bony orbits. Chronic appearing deformities of the nasal bones similar to comparison imaging in 2011. No other mid face fractures are seen. The pterygoid plates are intact. No visible or suspected temporal bone fractures. Temporomandibular joints are normally aligned. The mandible is intact. Numerous dental amalgams and metallic orthotic hardware limit evaluation of the dentition. No clearly fractured or avulsed teeth. Orbits: Some slightly asymmetric right supraorbital swelling and palpebral thickening. No retro septal gas, stranding or hemorrhage. Bilateral lens extractions. Small amount of right sub palpebral gas is a normal finding. Sinuses: Postsurgical changes from prior antrectomies and partial ethmoidectomies. Some pneumatized secretions and thickening are present within the posterior ethmoids on the left with slightly hyperostotic changes likely reflecting an acute on chronic sinusitis. Remaining paranasal sinuses and mastoid air cells are predominantly clear. Middle ear cavities are clear. Ossicular chains are normally configured. Soft tissues: Right periorbital and supraorbital soft tissue swelling. Mild right malar  contusion and laceration as well. Question some mild pre mental thickening. Soft tissues are otherwise unremarkable without further gas or foreign body. CT CERVICAL SPINE FINDINGS Alignment: Stabilization collar is absent at the time of examination slightly exaggerated cervical lordosis. Minimal anterolisthesis C2 on 3 and retrolisthesis C3 on 4 is favored to be on a degenerative basis with spondylitic changes at these levels. No evidence  of traumatic listhesis. No abnormally widened, perched or jumped facets. Normal alignment of the craniocervical articulations accounting for mild rightward cranial rotation. Effacement of the atlantodental interval likely related to advanced arthrosis. Skull base and vertebrae: The osseous structures appear diffusely demineralized which may limit detection of small or nondisplaced fractures. No visible skull base fracture is seen. No discernible fracture of the vertebral bodies or posterior elements. No worrisome lytic or blastic lesions. Multilevel cervical spondylitic changes as below. Advanced arthrosis at the atlantodental and basion dens interval including a likely degenerative os odontoideum. Soft tissues and spinal canal: No pre or paravertebral fluid or swelling. No visible canal hematoma. Some minimal enthesopathic mineralization in the nuchal ligament. Disc levels: Multilevel cervical spondylitic changes are present with diffuse intervertebral disc height loss and posterior ridging with disc osteophyte complexes. Larger complexes at C4-5 and C5-6 result in at most mild canal stenosis with only partial effacement of ventral thecal sac at the remaining cervical levels. Diffuse uncinate spurring and facet hypertrophic changes result in mild-to-moderate multilevel neural foraminal narrowing without severe foraminal impingement in the cervical spine. Upper chest: Some apical emphysematous changes and biapical pleuroparenchymal scarring. Calcification of the proximal great  vessels. Cervical carotid atherosclerosis is noted. Other: No concerning thyroid nodules or masses. Diminutive appearance of the thyroid. IMPRESSION: 1. Right frontal/supraorbital swelling and laceration extending to the temporal soft tissues as well. Thin crescentic scalp hematoma measuring up to 6 mm maximal thickness. No subjacent calvarial fracture or other acute osseous injury. 2. No acute intracranial abnormality. 3. Background of chronic microvascular angiopathy and parenchymal volume loss. Ventricular dilatation is slightly out of proportion to the degree of sulcal prominence with narrowing of the callososeptal angle and upward bowing of the corpus callosum. Findings could suggest a normal pressure hydrocephalus though are similar to comparison imaging. Correlate with clinical symptoms. 4. Mild right malar contusion and laceration. 5. No acute facial bone fracture. 6. Chronic appearing deformities of the nasal bones. 7. Postsurgical changes prior sinus surgery. Residual pneumatized secretions and thickening within the posterior ethmoids on the left with slightly hyperostotic changes likely reflecting an acute on chronic sinusitis. 8. No evidence of acute fracture or traumatic listhesis of the cervical spine. 9. Diffuse cervical spondylitic changes with multifactorial mild canal stenosis and mild-to-moderate multilevel foraminal narrowing as described above. 10. Cervical and intracranial atherosclerosis. 11.  Emphysema (ICD10-J43.9). Electronically Signed   By: Lovena Le M.D.   On: 01/14/2020 04:19   CT Cervical Spine Wo Contrast  Result Date: 12/22/2019 CLINICAL DATA:  Fall, right cheek skin tear and right supraorbital hematoma EXAM: CT HEAD WITHOUT CONTRAST CT MAXILLOFACIAL WITHOUT CONTRAST CT CERVICAL SPINE WITHOUT CONTRAST TECHNIQUE: Multidetector CT imaging of the head, cervical spine, and maxillofacial structures were performed using the standard protocol without intravenous contrast.  Multiplanar CT image reconstructions of the cervical spine and maxillofacial structures were also generated. COMPARISON:  CT head 12/17/2014, MRI 12/12/2009, paranasal sinus CT 11/04/2009 FINDINGS: CT HEAD FINDINGS Brain: No evidence of acute infarction, hemorrhage, hydrocephalus, extra-axial collection, visible mass lesion or mass effect. Diffuse prominence of the cisterns and sulci compatible with parenchymal volume loss. Ventricular dilatation is sat Lea out of proportion to the degree of sulcal prominence with slight narrowing of the callososeptal angle and upward bowing of the corpus callosum. Patchy areas of white matter hypoattenuation are most compatible with chronic microvascular angiopathy. Midline structures are normal. Cerebellar tonsils are normally position. Vascular: Atherosclerotic calcification of the carotid siphons and intradural vertebral arteries. No hyperdense vessel. Skull:  Right frontal/supraorbital swelling and laceration extending to the temporal soft tissues as well. Thin crescentic scalp hematoma measuring up to 6 mm maximal thickness. No subjacent calvarial fracture or other acute osseous injury of the calvaria. Other: None. CT MAXILLOFACIAL FINDINGS Osseous: No fracture of the bony orbits. Chronic appearing deformities of the nasal bones similar to comparison imaging in 2011. No other mid face fractures are seen. The pterygoid plates are intact. No visible or suspected temporal bone fractures. Temporomandibular joints are normally aligned. The mandible is intact. Numerous dental amalgams and metallic orthotic hardware limit evaluation of the dentition. No clearly fractured or avulsed teeth. Orbits: Some slightly asymmetric right supraorbital swelling and palpebral thickening. No retro septal gas, stranding or hemorrhage. Bilateral lens extractions. Small amount of right sub palpebral gas is a normal finding. Sinuses: Postsurgical changes from prior antrectomies and partial  ethmoidectomies. Some pneumatized secretions and thickening are present within the posterior ethmoids on the left with slightly hyperostotic changes likely reflecting an acute on chronic sinusitis. Remaining paranasal sinuses and mastoid air cells are predominantly clear. Middle ear cavities are clear. Ossicular chains are normally configured. Soft tissues: Right periorbital and supraorbital soft tissue swelling. Mild right malar contusion and laceration as well. Question some mild pre mental thickening. Soft tissues are otherwise unremarkable without further gas or foreign body. CT CERVICAL SPINE FINDINGS Alignment: Stabilization collar is absent at the time of examination slightly exaggerated cervical lordosis. Minimal anterolisthesis C2 on 3 and retrolisthesis C3 on 4 is favored to be on a degenerative basis with spondylitic changes at these levels. No evidence of traumatic listhesis. No abnormally widened, perched or jumped facets. Normal alignment of the craniocervical articulations accounting for mild rightward cranial rotation. Effacement of the atlantodental interval likely related to advanced arthrosis. Skull base and vertebrae: The osseous structures appear diffusely demineralized which may limit detection of small or nondisplaced fractures. No visible skull base fracture is seen. No discernible fracture of the vertebral bodies or posterior elements. No worrisome lytic or blastic lesions. Multilevel cervical spondylitic changes as below. Advanced arthrosis at the atlantodental and basion dens interval including a likely degenerative os odontoideum. Soft tissues and spinal canal: No pre or paravertebral fluid or swelling. No visible canal hematoma. Some minimal enthesopathic mineralization in the nuchal ligament. Disc levels: Multilevel cervical spondylitic changes are present with diffuse intervertebral disc height loss and posterior ridging with disc osteophyte complexes. Larger complexes at C4-5 and C5-6  result in at most mild canal stenosis with only partial effacement of ventral thecal sac at the remaining cervical levels. Diffuse uncinate spurring and facet hypertrophic changes result in mild-to-moderate multilevel neural foraminal narrowing without severe foraminal impingement in the cervical spine. Upper chest: Some apical emphysematous changes and biapical pleuroparenchymal scarring. Calcification of the proximal great vessels. Cervical carotid atherosclerosis is noted. Other: No concerning thyroid nodules or masses. Diminutive appearance of the thyroid. IMPRESSION: 1. Right frontal/supraorbital swelling and laceration extending to the temporal soft tissues as well. Thin crescentic scalp hematoma measuring up to 6 mm maximal thickness. No subjacent calvarial fracture or other acute osseous injury. 2. No acute intracranial abnormality. 3. Background of chronic microvascular angiopathy and parenchymal volume loss. Ventricular dilatation is slightly out of proportion to the degree of sulcal prominence with narrowing of the callososeptal angle and upward bowing of the corpus callosum. Findings could suggest a normal pressure hydrocephalus though are similar to comparison imaging. Correlate with clinical symptoms. 4. Mild right malar contusion and laceration. 5. No acute facial bone fracture. 6.  Chronic appearing deformities of the nasal bones. 7. Postsurgical changes prior sinus surgery. Residual pneumatized secretions and thickening within the posterior ethmoids on the left with slightly hyperostotic changes likely reflecting an acute on chronic sinusitis. 8. No evidence of acute fracture or traumatic listhesis of the cervical spine. 9. Diffuse cervical spondylitic changes with multifactorial mild canal stenosis and mild-to-moderate multilevel foraminal narrowing as described above. 10. Cervical and intracranial atherosclerosis. 11.  Emphysema (ICD10-J43.9). Electronically Signed   By: Lovena Le M.D.   On:  01/02/2020 04:19   Pelvis Portable  Result Date: 01/03/2020 CLINICAL DATA:  Status post right hip fracture repair EXAM: PORTABLE PELVIS 1-2 VIEWS COMPARISON:  None. FINDINGS: The right hip fracture is been repaired with a gamma nail and intramedullary rod with a distal interlocking screw. Postoperative changes noted. No other acute abnormalities. IMPRESSION: Right hip fracture repair as above. Electronically Signed   By: Dorise Bullion III M.D   On: 01/06/2020 11:22   DG CHEST PORT 1 VIEW  Result Date: 01/24/2020 CLINICAL DATA:  Shortness of breath. EXAM: PORTABLE CHEST 1 VIEW COMPARISON:  January 21, 2020 FINDINGS: Bilateral pulmonary infiltrates have worsened in the interval, particularly in the upper lobes. There is a layering right effusion with underlying opacity, not seen previously. The cardiomediastinal silhouette is unchanged. No pneumothorax. IMPRESSION: New right pleural effusion and worsening bilateral pulmonary infiltrates worrisome for pneumonia or aspiration. Electronically Signed   By: Dorise Bullion III M.D   On: 01/24/2020 10:17   DG CHEST PORT 1 VIEW  Result Date: 01/19/2020 CLINICAL DATA:  Cough.  Pneumonia. EXAM: PORTABLE CHEST 1 VIEW COMPARISON:  01/18/2020 FINDINGS: Stable cardiac enlargement. Aortic atherosclerosis. Previous median sternotomy, CABG procedure, and aortic valve repair. Unchanged right pleural effusion with veil like opacification of the right lower lobe. The left pleural effusion appears decreased from previous exam. No significant change in bilateral upper lung zone predominant airspace opacities. IMPRESSION: 1. Persistent bilateral upper lung zone predominant airspace opacities. 2. Stable right pleural effusion. 3. Decrease in left pleural effusion. Electronically Signed   By: Kerby Moors M.D.   On: 01/19/2020 10:42   DG CHEST PORT 1 VIEW  Result Date: 01/18/2020 CLINICAL DATA:  Pneumonia EXAM: PORTABLE CHEST 1 VIEW COMPARISON:  01/15/2020 FINDINGS:  The lungs are symmetrically well expanded. Multifocal pulmonary consolidation has progressed in the interval since prior examination, particularly within the right upper lobe, most in keeping with changes of multifocal pneumonia. Superimposed mild probable cardiogenic pulmonary edema persists with perihilar interstitial pulmonary infiltrate and small bilateral pleural effusions. No pneumothorax. Coronary artery bypass grafting and aortic valve replacement has been performed. Cardiac size within normal limits. No acute bone abnormality. IMPRESSION: Progressive multifocal pulmonary infiltrates in keeping with multifocal pneumonia. Suspected superimposed mild persistent cardiogenic pulmonary edema Electronically Signed   By: Fidela Salisbury MD   On: 01/18/2020 07:14   DG CHEST PORT 1 VIEW  Result Date: 01/15/2020 CLINICAL DATA:  Shortness of breath. EXAM: PORTABLE CHEST 1 VIEW COMPARISON:  Chest x-ray from yesterday. FINDINGS: Stable cardiomediastinal silhouette status post CABG and aortic valve replacement. Worsening hazy airspace disease in the right upper and lower lobes. Unchanged hazy airspace disease in the left mid lung and left lower lobe consolidation. No pneumothorax or large pleural effusion. No acute osseous abnormality. IMPRESSION: 1. Multifocal pneumonia, worsened in the right lung. Electronically Signed   By: Titus Dubin M.D.   On: 01/15/2020 12:29   DG CHEST PORT 1 VIEW  Result Date: 01/14/2020 CLINICAL DATA:  Evaluate pneumonia EXAM: PORTABLE CHEST 1 VIEW COMPARISON:  01/12/2020 FINDINGS: Previous median sternotomy, aortic valve replacement and CABG. Aortic atherosclerotic calcifications identified. Airspace disease within the left midlung and left lower lung are unchanged in the interval. New, developing opacity within the right lower lung and right upper lobe noted. IMPRESSION: 1. Persistent left mid and left lower lung airspace disease. 2. New airspace densities within the right upper  and right lower lung. Electronically Signed   By: Kerby Moors M.D.   On: 01/14/2020 07:31   DG CHEST PORT 1 VIEW  Result Date: 01/12/2020 CLINICAL DATA:  Status post right IM nail fever EXAM: PORTABLE CHEST 1 VIEW COMPARISON:  01/17/2020, 10/15/2017 FINDINGS: Worsening airspace disease in the left lower lung. Mild cardiomegaly with aortic atherosclerosis. No pneumothorax. Post sternotomy changes with valve prosthesis. IMPRESSION: Worsening airspace disease in the left lower lung, suspect for pneumonia or potentially aspiration. Cardiomegaly. Electronically Signed   By: Donavan Foil M.D.   On: 01/12/2020 20:00   DG Knee Right Port  Result Date: 01/12/2020 CLINICAL DATA:  Closed comminuted intertrochanteric femur fracture. EXAM: PORTABLE RIGHT KNEE - 1-2 VIEW COMPARISON:  Hip radiography from earlier today FINDINGS: Negative for knee subluxation or fracture. Osteopenia. Extensive arterial calcification. IMPRESSION: No acute finding. Electronically Signed   By: Monte Fantasia M.D.   On: 12/21/2019 09:09   DG Swallowing Func-Speech Pathology  Result Date: 01/15/2020 Objective Swallowing Evaluation: Type of Study: MBS-Modified Barium Swallow Study  Patient Details Name: KEYSHON STEIN MRN: [id number] Date of Birth: 12-16-1930 Today's Date: 01/15/2020 Time: SLP Start Time (ACUTE ONLY): 6378 -SLP Stop Time (ACUTE ONLY): 0901 SLP Time Calculation (min) (ACUTE ONLY): 26 min Past Medical History: Past Medical History: Diagnosis Date . Allergic rhinitis  . Anemia  . Arthritis   SHOULDER . Asthma with bronchitis  . Colon polyp  . COPD (chronic obstructive pulmonary disease) (HCC)   Astmatic bronchitis . Dysrhythmia 08/2012  NEW ONSET ATRIAL FIBRILATION . Gout   PMH . Headache(784.0)  . HLD (hyperlipidemia)  . Nephrolithiasis  . Osteoporosis  . RLS (restless legs syndrome)  . Skin cancer (melanoma) ([place]) 2012  Right neck . Skin cancer, basal cell   Dr Nevada Crane, [facility] Past Surgical History: Past Surgical History:  Procedure Laterality Date . AORTIC VALVE REPLACEMENT N/A 02/20/2013  Procedure: AORTIC VALVE REPLACEMENT (AVR);  Surgeon: Ivin Poot, MD;  Location: [place];  Service: Open Heart Surgery;  Laterality: N/A; . COLONOSCOPY W/ POLYPECTOMY    Dr Deatra Ina . CORONARY ARTERY BYPASS GRAFT N/A 02/20/2013  Procedure: CORONARY ARTERY BYPASS GRAFTING (CABG);  Surgeon: Ivin Poot, MD;  Location: [place];  Service: Open Heart Surgery;  Laterality: N/A; . FINGER SURGERY    for foreign body . INGUINAL HERNIA REPAIR   . INTRAMEDULLARY (IM) NAIL INTERTROCHANTERIC Right 12/25/2019  Procedure: INTRAMEDULLARY (IM) NAIL INTERTROCHANTRIC;  Surgeon: Rod Can, MD;  Location: [facility];  Service: Orthopedics;  Laterality: Right; . INTRAOPERATIVE TRANSESOPHAGEAL ECHOCARDIOGRAM N/A 02/20/2013  Procedure: INTRAOPERATIVE TRANSESOPHAGEAL ECHOCARDIOGRAM;  Surgeon: Ivin Poot, MD;  Location: [place];  Service: Open Heart Surgery;  Laterality: N/A; . LEFT AND RIGHT HEART CATHETERIZATION WITH CORONARY ANGIOGRAM N/A 01/05/2013  Procedure: LEFT AND RIGHT HEART CATHETERIZATION WITH CORONARY ANGIOGRAM;  Surgeon: Blane Ohara, MD;  Location: [facility] CATH LAB;  Service: Cardiovascular;  Laterality: N/A; . LEG SURGERY  06/2010   Dr.Lawson, for blood clott. Left leg  . NECK SURGERY  12/2010  Melanoma ; [facility]  . NOSE SURGERY  Septal Deviation . RIGHT HEART CATHETERIZATION N/A 01/23/2013  Procedure: RIGHT HEART CATH;  Surgeon: Jolaine Artist, MD;  Location: [facility] CATH LAB;  Service: Cardiovascular;  Laterality: N/A; . TEE WITHOUT CARDIOVERSION N/A 01/23/2013  Procedure: TRANSESOPHAGEAL ECHOCARDIOGRAM (TEE);  Surgeon: Jolaine Artist, MD;  Location: [facility] ENDOSCOPY;  Service: Cardiovascular;  Laterality: N/A;  sign heart cath consent w/tee consent HPI: Pt is an 87 y.o. male with medical history significant for hyperlipidemia, CAD, A. fib on warfarin, GERD, CKD stage III, Parkinson's disease who presented to the ED via EMS after sustaining a fall  at home. CXR 11/23: Worsening airspace disease in the left lower lung, suspect for pneumonia or potentially aspiration. Cardiomegaly. Pt was most recently seen by SLP on 03/17/13 and a dysphagia 2 diet with thin liquids was recommended at that time per pt's request d/t thrush. MBS 02/26/13: Pt. exhibited mild oral dysphagia with delayed transit exacerbated by xerostomia and decreased endurance.  Mild-mod sensory pharyngeal dysphagia with reduced tongue base retraction and laryngeal elevation resulting in trace vallecular/pyriform sinus/pharyngeal wall residue.    Concerns for aspiration pna present.  Subjective: pt asleep in bed - aroused however in his room prior to transport Assessment / Plan / Recommendation CHL IP CLINICAL IMPRESSIONS 01/15/2020 Clinical Impression Moderate oral and mild pharyngeal dysphagia without overt aspiration.  Decreased oral coordination/transiting/strength noted with vertical mastication pattern.  Pt noted to have secretions retained in phayrnx which mixed with barium.  Minimal laryngeal penetration of thin liquids noted due to decreased laryngeal closure.  No coughing noted during testing however pt coughed immediately after testing with water intake.  Retention noted in vallecular space due to decreased tongue base retraction/epiglottic deflection.  Attempts at chin tuck were not successful.  Sensation of retention in phayrnx noted by pt only intermittently.  Recommend pt continue dys1/nectar diet at this time due to conserve energy, allowing tsps of thin.  Recommend pt's wife be allowed to provide soft whole foods in small amounts.  Follow up SLP to help strengthen pt's swallowing and pulmonary clearance.  Question if pt may have thrush, ? coating in oral cavity that did not clear with oral care/suctioning.  Will follow up. SLP Visit Diagnosis Dysphagia, unspecified (R13.10) Attention and concentration deficit following -- Frontal lobe and executive function deficit following -- Impact  on safety and function Mild aspiration risk;Moderate aspiration risk   CHL IP TREATMENT RECOMMENDATION 01/15/2020 Treatment Recommendations Therapy as outlined in treatment plan below   Prognosis 01/15/2020 Prognosis for Safe Diet Advancement -- Barriers to Reach Goals Cognitive deficits Barriers/Prognosis Comment -- CHL IP DIET RECOMMENDATION 01/15/2020 SLP Diet Recommendations Dysphagia 1 (Puree) solids;Nectar thick liquid Liquid Administration via Straw;Cup Medication Administration Crushed with puree Compensations Slow rate;Small sips/bites;Other (Comment) Postural Changes --   CHL IP OTHER RECOMMENDATIONS 01/15/2020 Recommended Consults -- Oral Care Recommendations Oral care QID Other Recommendations Order thickener from pharmacy   CHL IP FOLLOW UP RECOMMENDATIONS 01/15/2020 Follow up Recommendations Skilled Nursing facility   [facility] IP FREQUENCY AND DURATION 01/15/2020 Speech Therapy Frequency (ACUTE ONLY) min 2x/week Treatment Duration 2 weeks      CHL IP ORAL PHASE 01/15/2020 Oral Phase Impaired Oral - Pudding Teaspoon -- Oral - Pudding Cup -- Oral - Honey Teaspoon -- Oral - Honey Cup -- Oral - Nectar Teaspoon Decreased bolus cohesion;Weak lingual manipulation Oral - Nectar Cup -- Oral - Nectar Straw Decreased bolus cohesion;Weak lingual manipulation Oral - Thin Teaspoon Decreased bolus cohesion;Weak lingual manipulation;Reduced posterior propulsion Oral - Thin Cup Decreased bolus cohesion;Weak  lingual manipulation;Reduced posterior propulsion Oral - Thin Straw Decreased bolus cohesion;Weak lingual manipulation;Reduced posterior propulsion Oral - Puree Decreased bolus cohesion;Weak lingual manipulation;Reduced posterior propulsion Oral - Mech Soft Impaired mastication;Weak lingual manipulation;Decreased bolus cohesion;Reduced posterior propulsion;Lingual/palatal residue;Premature spillage Oral - Regular -- Oral - Multi-Consistency -- Oral - Pill -- Oral Phase - Comment --  CHL IP PHARYNGEAL PHASE 01/15/2020  Pharyngeal Phase Impaired Pharyngeal- Pudding Teaspoon -- Pharyngeal -- Pharyngeal- Pudding Cup -- Pharyngeal -- Pharyngeal- Honey Teaspoon -- Pharyngeal -- Pharyngeal- Honey Cup -- Pharyngeal -- Pharyngeal- Nectar Teaspoon -- Pharyngeal -- Pharyngeal- Nectar Cup Pharyngeal residue - valleculae Pharyngeal Material does not enter airway Pharyngeal- Nectar Straw Reduced epiglottic inversion;Pharyngeal residue - valleculae Pharyngeal Material does not enter airway Pharyngeal- Thin Teaspoon Delayed swallow initiation-pyriform sinuses;Reduced tongue base retraction;Pharyngeal residue - valleculae Pharyngeal -- Pharyngeal- Thin Cup Delayed swallow initiation-pyriform sinuses;Penetration/Aspiration during swallow;Pharyngeal residue - valleculae Pharyngeal Material enters airway, CONTACTS cords and then ejected out Pharyngeal- Thin Straw Reduced tongue base retraction;Pharyngeal residue - valleculae;Penetration/Aspiration during swallow Pharyngeal Material enters airway, CONTACTS cords and then ejected out Pharyngeal- Puree Pharyngeal residue - valleculae;Reduced tongue base retraction Pharyngeal Material does not enter airway Pharyngeal- Mechanical Soft Delayed swallow initiation-vallecula;Reduced tongue base retraction;Pharyngeal residue - valleculae Pharyngeal Material does not enter airway Pharyngeal- Regular -- Pharyngeal -- Pharyngeal- Multi-consistency -- Pharyngeal -- Pharyngeal- Pill -- Pharyngeal -- Pharyngeal Comment --  CHL IP CERVICAL ESOPHAGEAL PHASE 01/15/2020 Cervical Esophageal Phase Impaired Pudding Teaspoon -- Pudding Cup -- Honey Teaspoon -- Honey Cup -- Nectar Teaspoon -- Nectar Cup -- Nectar Straw -- Thin Teaspoon -- Thin Cup -- Thin Straw -- Puree -- Mechanical Soft -- Regular -- Multi-consistency -- Pill -- Cervical Esophageal Comment prominent cricopharyngeus did not impair barium flow Kathleen Lime, MS [facility] SLP Acute Rehab Services Office [phone] Pager [phone] Macario Golds 01/15/2020,  9:26 AM              DG C-Arm 1-60 Min-No Report  Result Date: 01/04/2020 Fluoroscopy was utilized by the requesting physician.  No radiographic interpretation.   DG HIPS BILAT WITH PELVIS 2V  Result Date: 01/22/2020 CLINICAL DATA:  Bilateral hip and knee pain. Recent right hip surgery. EXAM: DG HIP (WITH OR WITHOUT PELVIS) 2V BILAT COMPARISON:  01/11/2020 FINDINGS: Surgical fixation of right intertrochanteric fracture with satisfactory alignment. No new fracture.  Normal left hip joint.  No pelvic bone lesion. IMPRESSION: ORIF right intertrochanteric fracture.  No new finding. Electronically Signed   By: Franchot Gallo M.D.   On: 01/22/2020 12:51   DG HIP OPERATIVE UNILAT W OR W/O PELVIS RIGHT  Result Date: 01/06/2020 CLINICAL DATA:  Right hip fracture repair EXAM: OPERATIVE RIGHT HIP (WITH PELVIS IF PERFORMED)  VIEWS TECHNIQUE: Fluoroscopic spot image(s) were submitted for interpretation post-operatively. FLUOROSCOPY TIME:  40 seconds. Images: Two COMPARISON:  None. FINDINGS: A gamma nail and intramedullary femoral rod have been placed across the right hip fracture. Alignment is improved in the interval. No dislocation. A distal interlocking screw is noted. IMPRESSION: Right hip fracture repair as above. Electronically Signed   By: Dorise Bullion III M.D   On: 01/02/2020 11:22   DG Hip Unilat W or Wo Pelvis 2-3 Views Right  Result Date: 01/02/2020 CLINICAL DATA:  Initial evaluation for acute trauma, fall. EXAM: DG HIP (WITH OR WITHOUT PELVIS) 2-3V RIGHT COMPARISON:  None. FINDINGS: Acute comminuted intratrochanteric fracture of the right hip with superior subluxation. Bony pelvis grossly intact. Limited views of the left hip demonstrate no acute finding. Underlying osteopenia. Prominent vascular  calcifications about the proximal thighs. Degenerative change noted within the lower lumbar spine. IMPRESSION: Acute comminuted intertrochanteric fracture of the right hip. Electronically Signed   By:  Jeannine Boga M.D.   On: 01/18/2020 02:10   CT Maxillofacial WO CM  Result Date: 01/17/2020 CLINICAL DATA:  Fall, right cheek skin tear and right supraorbital hematoma EXAM: CT HEAD WITHOUT CONTRAST CT MAXILLOFACIAL WITHOUT CONTRAST CT CERVICAL SPINE WITHOUT CONTRAST TECHNIQUE: Multidetector CT imaging of the head, cervical spine, and maxillofacial structures were performed using the standard protocol without intravenous contrast. Multiplanar CT image reconstructions of the cervical spine and maxillofacial structures were also generated. COMPARISON:  CT head 12/17/2014, MRI 12/12/2009, paranasal sinus CT 11/04/2009 FINDINGS: CT HEAD FINDINGS Brain: No evidence of acute infarction, hemorrhage, hydrocephalus, extra-axial collection, visible mass lesion or mass effect. Diffuse prominence of the cisterns and sulci compatible with parenchymal volume loss. Ventricular dilatation is sat Lea out of proportion to the degree of sulcal prominence with slight narrowing of the callososeptal angle and upward bowing of the corpus callosum. Patchy areas of white matter hypoattenuation are most compatible with chronic microvascular angiopathy. Midline structures are normal. Cerebellar tonsils are normally position. Vascular: Atherosclerotic calcification of the carotid siphons and intradural vertebral arteries. No hyperdense vessel. Skull: Right frontal/supraorbital swelling and laceration extending to the temporal soft tissues as well. Thin crescentic scalp hematoma measuring up to 6 mm maximal thickness. No subjacent calvarial fracture or other acute osseous injury of the calvaria. Other: None. CT MAXILLOFACIAL FINDINGS Osseous: No fracture of the bony orbits. Chronic appearing deformities of the nasal bones similar to comparison imaging in 2011. No other mid face fractures are seen. The pterygoid plates are intact. No visible or suspected temporal bone fractures. Temporomandibular joints are normally aligned. The  mandible is intact. Numerous dental amalgams and metallic orthotic hardware limit evaluation of the dentition. No clearly fractured or avulsed teeth. Orbits: Some slightly asymmetric right supraorbital swelling and palpebral thickening. No retro septal gas, stranding or hemorrhage. Bilateral lens extractions. Small amount of right sub palpebral gas is a normal finding. Sinuses: Postsurgical changes from prior antrectomies and partial ethmoidectomies. Some pneumatized secretions and thickening are present within the posterior ethmoids on the left with slightly hyperostotic changes likely reflecting an acute on chronic sinusitis. Remaining paranasal sinuses and mastoid air cells are predominantly clear. Middle ear cavities are clear. Ossicular chains are normally configured. Soft tissues: Right periorbital and supraorbital soft tissue swelling. Mild right malar contusion and laceration as well. Question some mild pre mental thickening. Soft tissues are otherwise unremarkable without further gas or foreign body. CT CERVICAL SPINE FINDINGS Alignment: Stabilization collar is absent at the time of examination slightly exaggerated cervical lordosis. Minimal anterolisthesis C2 on 3 and retrolisthesis C3 on 4 is favored to be on a degenerative basis with spondylitic changes at these levels. No evidence of traumatic listhesis. No abnormally widened, perched or jumped facets. Normal alignment of the craniocervical articulations accounting for mild rightward cranial rotation. Effacement of the atlantodental interval likely related to advanced arthrosis. Skull base and vertebrae: The osseous structures appear diffusely demineralized which may limit detection of small or nondisplaced fractures. No visible skull base fracture is seen. No discernible fracture of the vertebral bodies or posterior elements. No worrisome lytic or blastic lesions. Multilevel cervical spondylitic changes as below. Advanced arthrosis at the atlantodental  and basion dens interval including a likely degenerative os odontoideum. Soft tissues and spinal canal: No pre or paravertebral fluid or swelling. No visible canal hematoma. Some  minimal enthesopathic mineralization in the nuchal ligament. Disc levels: Multilevel cervical spondylitic changes are present with diffuse intervertebral disc height loss and posterior ridging with disc osteophyte complexes. Larger complexes at C4-5 and C5-6 result in at most mild canal stenosis with only partial effacement of ventral thecal sac at the remaining cervical levels. Diffuse uncinate spurring and facet hypertrophic changes result in mild-to-moderate multilevel neural foraminal narrowing without severe foraminal impingement in the cervical spine. Upper chest: Some apical emphysematous changes and biapical pleuroparenchymal scarring. Calcification of the proximal great vessels. Cervical carotid atherosclerosis is noted. Other: No concerning thyroid nodules or masses. Diminutive appearance of the thyroid. IMPRESSION: 1. Right frontal/supraorbital swelling and laceration extending to the temporal soft tissues as well. Thin crescentic scalp hematoma measuring up to 6 mm maximal thickness. No subjacent calvarial fracture or other acute osseous injury. 2. No acute intracranial abnormality. 3. Background of chronic microvascular angiopathy and parenchymal volume loss. Ventricular dilatation is slightly out of proportion to the degree of sulcal prominence with narrowing of the callososeptal angle and upward bowing of the corpus callosum. Findings could suggest a normal pressure hydrocephalus though are similar to comparison imaging. Correlate with clinical symptoms. 4. Mild right malar contusion and laceration. 5. No acute facial bone fracture. 6. Chronic appearing deformities of the nasal bones. 7. Postsurgical changes prior sinus surgery. Residual pneumatized secretions and thickening within the posterior ethmoids on the left with  slightly hyperostotic changes likely reflecting an acute on chronic sinusitis. 8. No evidence of acute fracture or traumatic listhesis of the cervical spine. 9. Diffuse cervical spondylitic changes with multifactorial mild canal stenosis and mild-to-moderate multilevel foraminal narrowing as described above. 10. Cervical and intracranial atherosclerosis. 11.  Emphysema (ICD10-J43.9). Electronically Signed   By: Lovena Le M.D.   On: 12/25/2019 04:19    Microbiology: No results found for this or any previous visit (from the past 240 hour(s)).   Labs: Basic Metabolic Panel: No results for input(s): NA, K, CL, CO2, GLUCOSE, BUN, CREATININE, CALCIUM, MG, PHOS in the last 168 hours. Liver Function Tests: No results for input(s): AST, ALT, ALKPHOS, BILITOT, PROT, ALBUMIN in the last 168 hours. No results for input(s): LIPASE, AMYLASE in the last 168 hours. No results for input(s): AMMONIA in the last 168 hours. CBC: No results for input(s): WBC, NEUTROABS, HGB, HCT, MCV, PLT in the last 168 hours. Cardiac Enzymes: No results for input(s): CKTOTAL, CKMB, CKMBINDEX, TROPONINI in the last 168 hours. D-Dimer No results for input(s): DDIMER in the last 72 hours. BNP: Invalid input(s): POCBNP CBG: No results for input(s): GLUCAP in the last 168 hours. Anemia work up No results for input(s): VITAMINB12, FOLATE, FERRITIN, TIBC, IRON, RETICCTPCT in the last 72 hours. Urinalysis    Component Value Date/Time   COLORURINE YELLOW 01/13/2020 0120   APPEARANCEUR CLEAR 01/13/2020 0120   LABSPEC 1.019 01/13/2020 0120   PHURINE 5.0 01/13/2020 0120   GLUCOSEU NEGATIVE 01/13/2020 0120   GLUCOSEU NEGATIVE 10/16/2017 1613   HGBUR NEGATIVE 01/13/2020 0120   BILIRUBINUR NEGATIVE 01/13/2020 0120   KETONESUR 5 (A) 01/13/2020 0120   PROTEINUR 30 (A) 01/13/2020 0120   UROBILINOGEN 0.2 10/16/2017 1613   NITRITE NEGATIVE 01/13/2020 0120   LEUKOCYTESUR MODERATE (A) 01/13/2020 0120   Sepsis Labs Invalid  input(s): PROCALCITONIN,  WBC,  LACTICIDVEN     SIGNED:  Shelly Coss, MD  [facility] 02/03/2020, 3:45 PM Pager [phone]  If 7PM-7AM, please contact night-coverage [email or web address] Password [email or web address]

## 2020-02-20 NOTE — Progress Notes (Signed)
Patient still labored breathing but more comfortable than last night. RN offered another IV Ativan. Family Declined and stated "we are happy how he is right now". Will continue to monitor.

## 2020-02-20 NOTE — Progress Notes (Addendum)
Wasted Morphine45 cc in stericycle with Heloise Purpura, RN witnessed. SRP, RN

## 2020-02-20 DEATH — deceased

## 2020-04-01 ENCOUNTER — Ambulatory Visit: Payer: Medicare Other | Admitting: Neurology
